# Patient Record
Sex: Female | Born: 1940 | Race: White | Hispanic: No | Smoking: Former smoker
Health system: Southern US, Community
[De-identification: ages and names within clinical notes are randomized; demographics above are authoritative.]

## PROBLEM LIST (undated history)

## (undated) DIAGNOSIS — L570 Actinic keratosis: Secondary | ICD-10-CM

## (undated) DIAGNOSIS — E039 Hypothyroidism, unspecified: Secondary | ICD-10-CM

## (undated) DIAGNOSIS — N040 Nephrotic syndrome with minor glomerular abnormality: Secondary | ICD-10-CM

## (undated) DIAGNOSIS — E785 Hyperlipidemia, unspecified: Secondary | ICD-10-CM

## (undated) DIAGNOSIS — E7800 Pure hypercholesterolemia, unspecified: Secondary | ICD-10-CM

## (undated) DIAGNOSIS — N289 Disorder of kidney and ureter, unspecified: Secondary | ICD-10-CM

## (undated) DIAGNOSIS — C4491 Basal cell carcinoma of skin, unspecified: Secondary | ICD-10-CM

## (undated) DIAGNOSIS — M8580 Other specified disorders of bone density and structure, unspecified site: Secondary | ICD-10-CM

## (undated) DIAGNOSIS — IMO0002 Reserved for concepts with insufficient information to code with codable children: Secondary | ICD-10-CM

## (undated) DIAGNOSIS — E079 Disorder of thyroid, unspecified: Secondary | ICD-10-CM

## (undated) DIAGNOSIS — J45909 Unspecified asthma, uncomplicated: Secondary | ICD-10-CM

## (undated) DIAGNOSIS — R011 Cardiac murmur, unspecified: Secondary | ICD-10-CM

## (undated) DIAGNOSIS — M1990 Unspecified osteoarthritis, unspecified site: Secondary | ICD-10-CM

## (undated) DIAGNOSIS — K219 Gastro-esophageal reflux disease without esophagitis: Secondary | ICD-10-CM

## (undated) DIAGNOSIS — I10 Essential (primary) hypertension: Secondary | ICD-10-CM

## (undated) DIAGNOSIS — IMO0001 Reserved for inherently not codable concepts without codable children: Secondary | ICD-10-CM

## (undated) DIAGNOSIS — J449 Chronic obstructive pulmonary disease, unspecified: Secondary | ICD-10-CM

## (undated) HISTORY — DX: Essential (primary) hypertension: I10

## (undated) HISTORY — DX: Chronic obstructive pulmonary disease, unspecified: J44.9

## (undated) HISTORY — DX: Disorder of kidney and ureter, unspecified: N28.9

## (undated) HISTORY — DX: Nephrotic syndrome with minor glomerular abnormality: N04.0

## (undated) HISTORY — DX: Hyperlipidemia, unspecified: E78.5

## (undated) HISTORY — PX: BILATERAL SALPINGOOPHORECTOMY: SHX1223

## (undated) HISTORY — DX: Actinic keratosis: L57.0

## (undated) HISTORY — DX: Basal cell carcinoma of skin, unspecified: C44.91

## (undated) HISTORY — PX: ABDOMINAL HYSTERECTOMY: SHX81

## (undated) HISTORY — DX: Gastro-esophageal reflux disease without esophagitis: K21.9

## (undated) HISTORY — DX: Pure hypercholesterolemia, unspecified: E78.00

## (undated) HISTORY — DX: Other specified disorders of bone density and structure, unspecified site: M85.80

## (undated) HISTORY — DX: Disorder of thyroid, unspecified: E07.9

## (undated) HISTORY — DX: Reserved for concepts with insufficient information to code with codable children: IMO0002

## (undated) HISTORY — DX: Hypothyroidism, unspecified: E03.9

## (undated) HISTORY — PX: MEDIAL PARTIAL KNEE REPLACEMENT: SHX5965

## (undated) HISTORY — DX: Unspecified asthma, uncomplicated: J45.909

## (undated) HISTORY — PX: MOHS SURGERY: SUR867

## (undated) HISTORY — PX: APPENDECTOMY: SHX54

## (undated) HISTORY — PX: RENAL BIOPSY: SHX156

---

## 1998-09-20 ENCOUNTER — Other Ambulatory Visit: Admission: RE | Admit: 1998-09-20 | Discharge: 1998-09-20 | Payer: Self-pay | Admitting: Family Medicine

## 1998-12-24 ENCOUNTER — Ambulatory Visit (HOSPITAL_COMMUNITY): Admission: RE | Admit: 1998-12-24 | Discharge: 1998-12-24 | Payer: Self-pay | Admitting: *Deleted

## 1999-02-10 HISTORY — PX: COLONOSCOPY: SHX174

## 1999-11-12 ENCOUNTER — Encounter: Admission: RE | Admit: 1999-11-12 | Discharge: 1999-11-12 | Payer: Self-pay | Admitting: Family Medicine

## 1999-11-12 ENCOUNTER — Encounter: Payer: Self-pay | Admitting: Family Medicine

## 2000-02-06 ENCOUNTER — Ambulatory Visit (HOSPITAL_BASED_OUTPATIENT_CLINIC_OR_DEPARTMENT_OTHER): Admission: RE | Admit: 2000-02-06 | Discharge: 2000-02-06 | Payer: Self-pay | Admitting: *Deleted

## 2000-11-18 ENCOUNTER — Encounter: Payer: Self-pay | Admitting: Family Medicine

## 2000-11-18 ENCOUNTER — Encounter: Admission: RE | Admit: 2000-11-18 | Discharge: 2000-11-18 | Payer: Self-pay | Admitting: Family Medicine

## 2001-11-22 ENCOUNTER — Encounter: Payer: Self-pay | Admitting: Family Medicine

## 2001-11-22 ENCOUNTER — Encounter: Admission: RE | Admit: 2001-11-22 | Discharge: 2001-11-22 | Payer: Self-pay | Admitting: Family Medicine

## 2002-11-24 ENCOUNTER — Encounter: Admission: RE | Admit: 2002-11-24 | Discharge: 2002-11-24 | Payer: Self-pay | Admitting: Family Medicine

## 2002-11-24 ENCOUNTER — Encounter: Payer: Self-pay | Admitting: Family Medicine

## 2003-02-06 ENCOUNTER — Ambulatory Visit (HOSPITAL_COMMUNITY): Admission: RE | Admit: 2003-02-06 | Discharge: 2003-02-06 | Payer: Self-pay | Admitting: Orthopedic Surgery

## 2003-06-06 ENCOUNTER — Ambulatory Visit (HOSPITAL_COMMUNITY): Admission: RE | Admit: 2003-06-06 | Discharge: 2003-06-06 | Payer: Self-pay | Admitting: Gastroenterology

## 2003-08-02 ENCOUNTER — Ambulatory Visit (HOSPITAL_BASED_OUTPATIENT_CLINIC_OR_DEPARTMENT_OTHER): Admission: RE | Admit: 2003-08-02 | Discharge: 2003-08-02 | Payer: Self-pay | Admitting: Orthopedic Surgery

## 2003-11-27 ENCOUNTER — Ambulatory Visit (HOSPITAL_COMMUNITY): Admission: RE | Admit: 2003-11-27 | Discharge: 2003-11-27 | Payer: Self-pay | Admitting: Family Medicine

## 2004-12-01 ENCOUNTER — Ambulatory Visit (HOSPITAL_COMMUNITY): Admission: RE | Admit: 2004-12-01 | Discharge: 2004-12-01 | Payer: Self-pay | Admitting: Family Medicine

## 2004-12-09 ENCOUNTER — Inpatient Hospital Stay (HOSPITAL_COMMUNITY): Admission: RE | Admit: 2004-12-09 | Discharge: 2004-12-11 | Payer: Self-pay | Admitting: Orthopedic Surgery

## 2005-03-03 ENCOUNTER — Inpatient Hospital Stay (HOSPITAL_COMMUNITY): Admission: RE | Admit: 2005-03-03 | Discharge: 2005-03-05 | Payer: Self-pay | Admitting: Orthopedic Surgery

## 2005-12-02 ENCOUNTER — Encounter: Admission: RE | Admit: 2005-12-02 | Discharge: 2005-12-02 | Payer: Self-pay | Admitting: Family Medicine

## 2005-12-29 ENCOUNTER — Encounter: Admission: RE | Admit: 2005-12-29 | Discharge: 2005-12-29 | Payer: Self-pay | Admitting: Family Medicine

## 2006-02-09 HISTORY — PX: ROTATOR CUFF REPAIR: SHX139

## 2006-03-17 ENCOUNTER — Encounter: Admission: RE | Admit: 2006-03-17 | Discharge: 2006-03-17 | Payer: Self-pay | Admitting: Family Medicine

## 2006-03-19 ENCOUNTER — Encounter: Admission: RE | Admit: 2006-03-19 | Discharge: 2006-03-19 | Payer: Self-pay | Admitting: Family Medicine

## 2006-06-02 ENCOUNTER — Encounter: Admission: RE | Admit: 2006-06-02 | Discharge: 2006-06-02 | Payer: Self-pay | Admitting: Family Medicine

## 2007-03-17 ENCOUNTER — Encounter: Payer: Self-pay | Admitting: Pulmonary Disease

## 2007-03-17 ENCOUNTER — Encounter: Admission: RE | Admit: 2007-03-17 | Discharge: 2007-03-17 | Payer: Self-pay | Admitting: Family Medicine

## 2007-04-01 ENCOUNTER — Encounter: Payer: Self-pay | Admitting: Pulmonary Disease

## 2007-04-20 ENCOUNTER — Ambulatory Visit: Payer: Self-pay | Admitting: Pulmonary Disease

## 2007-04-20 DIAGNOSIS — R0602 Shortness of breath: Secondary | ICD-10-CM | POA: Insufficient documentation

## 2007-04-20 DIAGNOSIS — J309 Allergic rhinitis, unspecified: Secondary | ICD-10-CM | POA: Insufficient documentation

## 2007-04-20 DIAGNOSIS — J45909 Unspecified asthma, uncomplicated: Secondary | ICD-10-CM | POA: Insufficient documentation

## 2007-04-26 ENCOUNTER — Ambulatory Visit (HOSPITAL_COMMUNITY): Admission: RE | Admit: 2007-04-26 | Discharge: 2007-04-26 | Payer: Self-pay | Admitting: Family Medicine

## 2007-04-28 ENCOUNTER — Ambulatory Visit: Payer: Self-pay

## 2007-04-28 ENCOUNTER — Encounter (INDEPENDENT_AMBULATORY_CARE_PROVIDER_SITE_OTHER): Payer: Self-pay | Admitting: Internal Medicine

## 2007-05-16 ENCOUNTER — Telehealth (INDEPENDENT_AMBULATORY_CARE_PROVIDER_SITE_OTHER): Payer: Self-pay | Admitting: *Deleted

## 2007-10-14 ENCOUNTER — Encounter: Admission: RE | Admit: 2007-10-14 | Discharge: 2007-10-14 | Payer: Self-pay | Admitting: Family Medicine

## 2008-04-26 ENCOUNTER — Ambulatory Visit (HOSPITAL_COMMUNITY): Admission: RE | Admit: 2008-04-26 | Discharge: 2008-04-26 | Payer: Self-pay | Admitting: Family Medicine

## 2009-04-30 ENCOUNTER — Ambulatory Visit (HOSPITAL_COMMUNITY): Admission: RE | Admit: 2009-04-30 | Discharge: 2009-04-30 | Payer: Self-pay | Admitting: Family Medicine

## 2010-02-09 HISTORY — PX: REPLACEMENT TOTAL KNEE: SUR1224

## 2010-03-02 ENCOUNTER — Encounter: Payer: Self-pay | Admitting: Family Medicine

## 2010-03-11 NOTE — Assessment & Plan Note (Signed)
Summary: EVAL OF ASTHMA/APC   Visit Type:  Initial Consult PCP:  mazzocchi  Chief Complaint:  pulmonary consult....SOB....reviewed meds.  History of Present Illness: REferred for dyspnea on exertion x several yrs - gradually progreesive - on adavair & pro-air x 1 yr with limited benefit. Worse on walking uphill, no wheezing , no recurrent chest colds or cough No h/s/o orthopnea or PND. CXR -ve Participates in weight watchers & water aerobics - lost 6 lbs/ 6 weeks     Past Medical History:    Allergic Rhinitis    Asthma  Past Surgical History:    2 partial knee replacements    hysterectomy    rotator cuff on L shoulder   Family History:    Father died of cancer of throat    mother had emphysema    mother had allergies    Aunt had asthma  Social History:    lives alone    retired    2 children 1 boy and 1 girl    was a Runner, broadcasting/film/video   Risk Factors:  Tobacco use:  quit    Year quit:  25 years ago    Pack-years:  2 pks a day smoked for 20 yrs Alcohol use:  yes    Type:  wine    Drinks per day:  2    Vital Signs:  Patient Profile:   70 Years Old Female Height:     66 inches Weight:      210 pounds BMI:     34.02 O2 Sat:      94 % O2 treatment:    Room Air Temp:     97.9 degrees F oral Pulse rate:   98 / minute BP sitting:   130 / 80  (left arm) Cuff size:   regular  Pt. in pain?   no  Vitals Entered By: Clarise Cruz Duncan Dull) (April 20, 2007 2:02 PM)                  Physical Exam  General:     HEENT - no thrush, no post nasal drip No JVD, no lymphadenopathy CVS- s1s2 nml, no murmur RS- clear, no crackles or rhonchi Abd- soft, non-tender, no organomegaly CNS- non focal Ext - no edema      Impression & Recommendations:  Problem # 1:  DYSPNEA (ICD-786.05) cxr-ve, spirometry reviewed - mild obstruction- not impressed Will obtain echo,  Reassess in 3-4 mnths Orders: Consultation Level III (44034) Echo Referral (Echo)   Medications  Added to Medication List This Visit: 1)  Nexium 40 Mg Cpdr (Esomeprazole magnesium) .... Once daily 2)  Lipitor 20 Mg Tabs (Atorvastatin calcium) .... Once daily 3)  Adult Aspirin Ec Low Strength 81 Mg Tbec (Aspirin) .... Once daily 4)  Climara 0.05 Mg/24hr Ptwk (Estradiol) .... Once daily 5)  Claritin-d 24 Hour 10-240 Mg Tb24 (Loratadine-pseudoephedrine) .... Once daily 6)  Hydrochlorothiazide 25 Mg Tabs (Hydrochlorothiazide) .... Once daily 7)  Advair Diskus 250-50 Mcg/dose Misc (Fluticasone-salmeterol) .Marland Kitchen.. 1 puff twice a day 8)  Celebrex 200 Mg Caps (Celecoxib) .Marland Kitchen.. 1 cap two times a day 9)  Proair Hfa 108 (90 Base) Mcg/act Aers (Albuterol sulfate) .... 2 puffs two times a day 10)  Oscal 500/200 D-3 500-200 Mg-unit Tabs (Calcium-vitamin d) .... Once daily 11)  Opc-3  .... As directed 12)  Vitamin D3-50,000 Units (vitamin D4)  .... As directed   Patient Instructions: 1)  Please schedule a follow-up appointment in 4 months. 2)  You  have been asked to make an appointment for a Pulmonary Function Test (Breathing Test) prior to or at the time of your next visit. Use medications as usual unless otherwise instructed. 3)  A 2D Echocardiogram has been recommended.  Your imaging study may require preauthorization. 4)  call if symptoms worsen    ]

## 2010-03-11 NOTE — Progress Notes (Signed)
Summary: need resuslts  Phone Note Call from Patient   Caller: Patient Call For: alva Summary of Call: need results of echocardiogram Patient's chart has been requested.  Initial call taken by: Rickard Patience,  May 16, 2007 8:55 AM  Follow-up for Phone Call        ECHO Report printed for your review and given to [facility]. ..................................................................Marland KitchenMichel Bickers CMA  May 16, 2007 9:25 AM  I called ms to advised echo normal per dr. Vassie Loll .Marland Kitchen...no answer and left message. Follow-up by: Clarise Cruz Duncan Dull),  May 16, 2007 3:09 PM  Additional Follow-up for Phone Call Additional follow up Details #1::        Pt called back.  Advised ECHO results normal per above note. Additional Follow-up by: Cloyde Reams RN,  May 16, 2007 2:26 PM

## 2010-04-01 ENCOUNTER — Other Ambulatory Visit: Payer: Self-pay

## 2010-04-01 DIAGNOSIS — Z1231 Encounter for screening mammogram for malignant neoplasm of breast: Secondary | ICD-10-CM

## 2010-05-02 ENCOUNTER — Ambulatory Visit (HOSPITAL_COMMUNITY): Payer: Medicare Other

## 2010-05-07 ENCOUNTER — Ambulatory Visit (HOSPITAL_COMMUNITY)
Admission: RE | Admit: 2010-05-07 | Discharge: 2010-05-07 | Disposition: A | Discharge status: Home / Self Care | Payer: Medicare Other | Source: Ambulatory Visit | Attending: Family Medicine | Admitting: Family Medicine

## 2010-05-07 DIAGNOSIS — Z1231 Encounter for screening mammogram for malignant neoplasm of breast: Secondary | ICD-10-CM | POA: Insufficient documentation

## 2010-05-09 ENCOUNTER — Other Ambulatory Visit: Payer: Self-pay | Admitting: Family Medicine

## 2010-05-09 DIAGNOSIS — R928 Other abnormal and inconclusive findings on diagnostic imaging of breast: Secondary | ICD-10-CM

## 2010-05-19 ENCOUNTER — Ambulatory Visit
Admission: RE | Admit: 2010-05-19 | Discharge: 2010-05-19 | Disposition: A | Discharge status: Home / Self Care | Payer: Medicare Other | Source: Ambulatory Visit | Attending: Family Medicine | Admitting: Family Medicine

## 2010-05-19 DIAGNOSIS — R928 Other abnormal and inconclusive findings on diagnostic imaging of breast: Secondary | ICD-10-CM

## 2010-06-27 NOTE — H&P (Signed)
NAMELILLE, [name]             ACCOUNT NO.:  [id number]   MEDICAL RECORD NO.:  [id number]           PATIENT TYPE:   LOCATION:                                 FACILITY:   PHYSICIAN:  Madlyn Frankel. Charlann Boxer, M.D.       DATE OF BIRTH:   DATE OF ADMISSION:  03/08/2005  DATE OF DISCHARGE:                                HISTORY & PHYSICAL   PRINCIPAL DIAGNOSIS:  Right knee medial compartment osteoarthritis.   SECONDARY DIAGNOSES:  1.  History of heart murmur.  2.  History of urinary incontinence.  3.  Arthritis.   HISTORY OF PRESENT ILLNESS:  [name] is a pleasant 70 year old female  who I have been treating orthopedically. Most recently, she was status post  a left unicompartmental knee replacement for medial compartment  osteoarthritis which she has done very well. She has had progressive pain in  the right knee with failure of conservative measures. She wished to proceed  with the same procedure as on the left. She was in the office today for a  history and physical with no reports of any fevers, chills, night sweats.  She is ready to proceed with the right surgery.   PAST MEDICAL HISTORY:  1.  Urinary incontinence.  2.  History of heart murmur.  3.  Arthritis.   CURRENT MEDICATIONS:  1.  Celebrex which has been stopped preoperatively.  2.  Lipitor.  3.  Estradiol.  4.  Aspirin.  5.  Claritin.  6.  Advair.   ALLERGIES:  No known drug allergies.   FAMILY HISTORY:  Noncontributory.   REVIEW OF SYSTEMS:  Unremarkable other than noted in the HPI. No upper  respiratory, pulmonary, cardiac, gastrointestinal, genitourinary issues  other than that already mentioned.   PHYSICAL EXAMINATION:  VITAL SIGNS:  She had a temperature of 97.8, heart  rate of 64, blood pressure 118/82.  GENERAL:  She is awake, alert, and oriented.  HEENT:  No asymmetry. No slurred speech.  CHEST:  Clear to auscultation bilaterally.  HEART:  Regular rate and rhythm. There is a murmur detected.  ABDOMEN:  Soft and nontender with positive bowel sounds.  EXTREMITIES:  Well-healed incision on the left knee with full knee extension  and full flexion. Stable ligaments. Extremity of the right knee reveals  tenderness along the medial side of the knee but full extension, flexion and  no deformity. Slight effusion.   Radiographs including stress radiographs of the right knee had been  performed indicating an anterior medial wear pattern with an intact medial  and anterior cruciate ligaments.   IMPRESSION:  Right knee medial compartment osteoarthritis, status post left  unicompartmental knee replacement.   PLAN:  The patient will be admitted for same day surgery on March 08, 2005  for right unicompartmental knee replacement. Risks and benefits were  discussed and reviewed from her previous surgery. Consent will be obtained  based off the office visit today. We expect her in the hospital for a one to  two day stay with plans to transition to outpatient physical therapy as she  had previously.  Madlyn Frankel Charlann Boxer, M.D.  Electronically Signed     MDO/MEDQ  D:  03/07/2005  T:  03/07/2005  Job:  [id number]

## 2010-06-27 NOTE — Discharge Summary (Signed)
NAMESYRETTA, KOCHEL             ACCOUNT NO.:  [id number]   MEDICAL RECORD NO.:  [id number]          PATIENT TYPE:  INP   LOCATION:  1512                         FACILITY:  [facility]   PHYSICIAN:  Madlyn Frankel. Charlann Boxer, M.D.  DATE OF BIRTH:  06-04-1940   DATE OF ADMISSION:  12/09/2004  DATE OF DISCHARGE:  12/11/2004                                 DISCHARGE SUMMARY   ADMISSION DIAGNOSES:  1.  Left knee medial compartment osteoarthritis, left greater than right.  2.  Dyslipidemia.   DISCHARGE DIAGNOSES:  1.  Left knee medial compartment osteoarthritis, left greater than right.  2.  Dyslipidemia.  3.  Mild postoperative anemia.   OPERATION:  On December 09, 2004, the patient underwent Oxford  unicompartmental knee.   BRIEF HISTORY:  This 70 year old lady with progressive problems concerning  her left knee.  She has arthritis in both knees, but it is more significant  on the left.  X-rays showed osteoarthritis of the medial compartment.  This  is a very active lady, and she highly desires to have the surgical  intervention so that she may continue with her daily activities.  After the  risks and benefits of surgery were described to the patient by Dr. Charlann Boxer, the  above procedure was performed.   HOSPITAL COURSE:  The patient tolerated the surgical procedure quite well.  She was placed on Coumadin protocol postoperatively for the prevention of  DVT.  Will continue on that four days after surgery.  She was given ADLs on  activities that would allow her to negotiate safely in her home.  Genevieve Norlander  was arranged for home health as well.  She did have some itching  postoperatively, which was felt due to the PCA.  That was discontinued.  She  had no more further problems.  She was discharged with analgesics as well as  muscle relaxants.   Laboratory values in the hospital hematologically showed a CBC  preoperatively completely within normal limits.  Final hemoglobin was 11.4,  hematocrit 33.6.   Blood chemistries x2 were negative.  Urinalysis negative  for urinary tract infection.   Chest x-ray showed cardiomegaly.  No edema.  No active cardiopulmonary  abnormalities.   No EKGs on this chart.   CONDITION ON DISCHARGE:  Improved, stable.   PLAN:  The patient is discharged to her home.  They are to see Dr. Charlann Boxer two  weeks after the date of surgery.  Continue weightbearing as tolerated.  She  may flex the knee anyway she desires.  Mild analgesics and muscle relaxants  were given to her at discharge.      Dooley L. Cherlynn June.      Madlyn Frankel Charlann Boxer, M.D.  Electronically Signed    DLU/MEDQ  D:  12/23/2004  T:  12/23/2004  Job:  [id number]

## 2010-06-27 NOTE — Op Note (Signed)
[place]. [facility]  Patient:    [name], [name]                    MRN: [id number] Proc. Date: 02/06/00 Adm. Date:  [id number] Attending:  Melody Comas.                           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  OPERATION PERFORMED:  Excisional biopsy of 2.0 cm area of Bowens disease upper forehead.  SURGEON:  Janet Berlin. Dan Humphreys, M.D.  ASSISTANT:  None.  ANESTHESIA:  Local.  INDICATIONS FOR PROCEDURE:  The patient is a 70 year old woman who has biopsy proven Bowens disease of the upper forehead.  This is oriented transversely in the midforehead just below the hairline.  She is taken to the operating room at this time for excisional biopsy.  DESCRIPTION OF PROCEDURE:  The patient was brought back to the minor surgery operating room in the [facility].  She was placed in the table in the supine position.  I sterilely prepped and draped the upper forehead area.  An elliptically shaped excision was designed to include grossly clear tissue margins using a sterile surgical marking pen.  The area was then infiltrated with 1% Xylocaine containing epinephrine.  The lesion was then excised full thickness.  The patient experienced quite a bit of bleeding from the wound which required several minutes of pressure and suturing to control.  I was able to reapproximate the wound edges with interrupted, buried sutures of 4-0 Vicryl.  This was followed with placement of vertical mattress sutures of 5-0 nylon.  A running 5-0 nylon suture was then used for final epidermal leveling.  The wound was sterilely dressed with bacitracin ointment, Xeroform ointment, 2 x 2 surgical gauze, and surgical tape.  The patient was given wound care instructions and will see me next week for suture removal and review of her final  pathology report. DD:  02/06/00 TD:  02/06/00 Job: [id number]

## 2010-06-27 NOTE — Op Note (Signed)
NAMEFLEUR, AUDINO             ACCOUNT NO.:  [id number]   MEDICAL RECORD NO.:  [id number]          PATIENT TYPE:  INP   LOCATION:  1517                         FACILITY:  [facility]   PHYSICIAN:  Madlyn Frankel. Charlann Boxer, M.D.  DATE OF BIRTH:  08/29/1940   DATE OF PROCEDURE:  12/09/2004  DATE OF DISCHARGE:                                 OPERATIVE REPORT   PREOPERATIVE DIAGNOSIS:  Left knee medial compartment osteoarthritis.   POSTOPERATIVE DIAGNOSIS:  Left knee medial compartment osteoarthritis.   PROCEDURE:  Left knee unicompartmental knee replacement, utilizing a Biomed  Oxford knee system with a size medication femur, 41 x 26 tibia and a size 4  polyethylene liner.   SURGEON:  Madlyn Frankel. Charlann Boxer, M.D.   ASSISTANTDruscilla Brownie. Cherlynn June.   ANESTHESIA:  General.   BLOOD LOSS:  150 cc.   TOURNIQUET TIME:  Ninety minutes at 250 mmHg.   DRAINS:  One.   COMPLICATIONS:  None.   INDICATIONS FOR PROCEDURE:  Ms. Noguchi is a 70 year old female who I had  been following for some time for bilateral knee pain, left greater than  right.  She has had knee arthroscopies for meniscal debridement,  chondroplasty.  The vast majority of her pain has been medially.  Radiographs reveal an anteromedial wear pattern.  I discussed surgical  options of total knee replacement versus unicompartmental, and she was very  much interested in pursuing the unicompartmental.  Risks and benefits were  reviewed, including failure due to progressive discomfort in the anterior  compartment or lateral compartment.  Consent was obtained.   PROCEDURE IN DETAIL:  The patient was brought to the operating theater.  Once adequate anesthesia and preoperative antibiotics were administered, 1  gm of Ancef, the patient was positioned supine.  A proximal thigh tourniquet  was placed.  The left lower extremity was positioned over the Oxford knee  holder.  The left lower extremity was then prepped and draped in a sterile  fashion.  A midline incision was made from the patella to the tubercle.  Sharp dissection was carried down.  We made a modified medial parapatellar  arthrotomy.  Following knee exposure, then partial medial meniscectomy,  attention was directed to the tibia.  A tibial cut was made perpendicular to  the tibial shaft along the tibial spine to the medial third of the tubercle.  The reciprocating saw was utilized to cut along the medial aspect of the  tibial spine into the notch towards the hip joint.  Following this, I sized  the tibia, and a size 4 spacer fit well into the space.  At this point, the  tibial extramedullary guide was removed, and attention was directed to the  femur.  With the tibial tray in place, a size 3 feeler gauge and the femoral  guide.  The guide was positioned with the medial portion of the femur and  parallel to the intramedullary rod in both the AP and lateral planes.   Once this was confirmed, drill holes were made.  Posterior femoral cutting  jig was positioned.  The oscillating saw was utilized to  make a cut.  At  this point, a 0 spigot was positioned, and the femur milled.  Based on the  math per protocol, with a 4 feeler gauge in place and flexion of the knee, I  had appropriate tension and flexion; however, on extension, I was unable to  even really get the #1 feeler gauge into position.  Given this, I put the 4  spigot into place.  It was noted at this time that the spigot was slightly  off the bone.  This would be important, as I would increase the spigot size.  Following placement of the 4, I still was unable to pass the two feeler  gauge, and extension and flexion of the 4 still felt good.  Because of this,  I put the six spigot in place.  At this point, I was able to mill the distal  femur, creating a ridge, as you would expect.  At this point with the trial  components in place, the size 4 feeler gauze fit nicely in flexion as well  as in extension.   Given these parameters, final debridement of the femur was  carried out, including the notch anteriorly and the osteophytes off the  medial and lateral aspects of the medial femoral condyle.  I did also knock  off posterior osteophytes, utilizing this special osteotome.  At this point,  the final preparation of the tibia was carried out with the 41 x 26 tibial  guide in place.  I used the reciprocating saw to create the trough and then  knocked out this bone.  The posterior and anterior cortex remained intact.  The trial components were then positioned with a keel component.  Then the 4  polyethylene trial was placed.  There was no lift-off in deep flexion.  The  knee came to full extension with stable ligaments.  There was about 1-2 mm  toggle, as would be expected in flexion.  At this point, the final  components were brought into the field.  The donor was prepared for final  cement preparation.  Cement was mixed in two batches.  Tibial components  were cemented in first, followed by femoral component.  At each time, time  was taken to make sure there was no excessive cement to be removed.  Once  cement had cured from both batches, the final polyethylene liner was  positioned.  This was a size 4.  It had a good snap-in feel.   Please note too, that soft tissue debridement, including the remaining  posterior horn and medial meniscectomy was carried out, prior to proceeding  with various steps of the procedure.  The medial collateral ligament  remained intact the whole time.  The ACL was visualized.  There was some  grade 2 chondromalacia, given the patellofemoral trochlear region.   Ms. Foots will be in the hospital for 1-2 days for pain control.  Get  her up with physical therapy and ambulating.      Madlyn Frankel Charlann Boxer, M.D.  Electronically Signed     MDO/MEDQ  D:  12/09/2004  T:  12/09/2004  Job:  [id number]

## 2010-06-27 NOTE — H&P (Signed)
[name], [name]             ACCOUNT NO.:  [id number]   MEDICAL RECORD NO.:  [id number]          PATIENT TYPE:  INP   LOCATION:  1512                         FACILITY:  [facility]   PHYSICIAN:  Madlyn Frankel. Charlann Boxer, M.D.  DATE OF BIRTH:  Jun 09, 1940   DATE OF ADMISSION:  12/09/2004  DATE OF DISCHARGE:  12/11/2004                                HISTORY & PHYSICAL   ADMISSION DIAGNOSIS:  Bilateral knee osteoarthritis, left worse than right,  primarily medial compartment.   HISTORY OF PRESENT ILLNESS:  Ms. Kirlin is a very pleasant 70 year old  female who I have been following for some time regarding bilateral knee  osteoarthritis.  She has failed conservative measures including knee  arthroscopy and medications, and injections.  When discussed treatment  options, based on her age, activity level, and radiographic appearance, most  of her wear pattern appeared to be in the anterior medial aspect of the  knee, and we discussed the options of unicompartmental versus total knee  replacement and she wishes to proceed with unicompartmental.   PAST MEDICAL HISTORY:  Hiatal hernia, osteoarthritis.   PAST SURGICAL HISTORY:  Hysterectomy, rotator cuff repair, left knee  arthroscopy, and right knee arthroscopy.   FAMILY HISTORY:  Lymphoma, arthritis.   CURRENT MEDICATIONS:  1.  Celebrex.  2.  Estradiol.  3.  Lipitor.   ALLERGIES:  No known drug allergies.   REVIEW OF SYSTEMS:  Otherwise unremarkable for an upper respiratory,  pulmonary, cardiac, gastrointestinal, or genitourinary issues.   PHYSICAL EXAMINATION:  VITAL SIGNS:  Temperature 98.7, pulse 76, blood  pressure 132/80.  GENERAL:  She is awake, alert, oriented, and cooperative.  Appropriately  dressed as she always has been.  No evidence of any facial asymmetry.  NECK:  Supple with no nodes palpable.  She has no bruits detected on  auscultation.  CHEST:  Clear to auscultation bilaterally.  HEART:  Regular rate and rhythm, no  murmurs detected.  ABDOMEN:  Soft and nontender with positive bowel sounds.  EXTREMITIES:  Left knee with slight varus tendency with pain over the  anterior medial aspect of the knee, none over the patella, minimal facet  tenderness, no lateral-sided tenderness.  Ligaments otherwise intact.  Overlying skin is intact with no cellulitis.   Radiographs indicate the left knee osteoarthritis in the medial compartment.  Stress radiographs reveal an intact MCL, preserved lateral compartment.  Lateral view indicates a functional anterior cruciate ligament.   IMPRESSION:  Left knee medial compartment osteoarthritis left greater than  right.   PLAN:  The patient is to go to the OR on the same day of admission, December 09, 2004, for left unicompartmental knee replacement.  Plan will be for a 1-  2 day hospital stay with plan on discharge with therapy.  Risks and benefits  were reviewed in the office.  Date of office visit was December 05, 2004.      Madlyn Frankel Charlann Boxer, M.D.  Electronically Signed     MDO/MEDQ  D:  12/17/2004  T:  12/18/2004  Job:  [id number]

## 2010-06-27 NOTE — Op Note (Signed)
NAME:  [name], COLLET                       ACCOUNT NO.:  [id number]   MEDICAL RECORD NO.:  [id number]                   PATIENT TYPE:  AMB   LOCATION:  DAY                                  FACILITY:  [facility]   PHYSICIAN:  Madlyn Frankel. Charlann Boxer, M.D.               DATE OF BIRTH:  26-Mar-1940   DATE OF PROCEDURE:  02/06/2003  DATE OF DISCHARGE:                                 OPERATIVE REPORT   PREOPERATIVE DIAGNOSES:  1. Left shoulder type 2 flap tear.  2. Left shoulder subacromial impingement.  3. Left shoulder rotator cuff tear involving the distal aspect of the     supraspinatus tendon.   POSTOPERATIVE DIAGNOSES/FINDINGS:  1. Left shoulder type 2 flap tear.  2. Left shoulder subacromial impingement.  3. Left shoulder rotator cuff tear involving the distal aspect of the     supraspinatus tendon.   PROCEDURES:  1. Diagnostic and operative arthroscopy with labrale debridement at flap     tear.  2. Open subacromial decompression.  3. Mini open rotator cuff repair.   SURGEON:  Madlyn Frankel. Charlann Boxer, M.D.   ASSISTANT:  Clarene Reamer, P.A.-C.   ANESTHESIA:  General.   BLOOD LOSS:  Minimal.   DRAINS:  None.   COMPLICATIONS:  None apparent.   INDICATIONS FOR PROCEDURE:  Ms. 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  She had failed physical therapy and injections.  Her last MRI  was one year prior to evaluation secondary to persistent difficulty.  A  repeat MRI was performed to rule out rotator cuff tear.  At that point,  there was identifiable cuff tear at the distal aspect of the supraspinatus.  After reviewing the risks and benefits of arthroscopic evaluation and open  rotator cuff repair, she consents for the procedure.   PROCEDURE IN DETAIL:  The patient was brought to the operating theater.  Once adequate anesthesia and preoperative antibiotics were administered, the  patient was positioned in  the beach chair position.  The left upper  extremity was then initially prepped and draped for arthroscopy.  Arthroscopic evaluation of that shoulder was carried out through a posterior  portal reveal a type 2 SLAP tear with significant labral deterioration.  The  remainder of the exam of the labrum was intact.  The glenoid surface was  noted to have a small 5 x 5 mm type 2 chondromalacia.  The humeral head was  otherwise intact.  The rotator cuff tear was identified in the distal aspect  of the supraspinatus.  At this point, the anterior portal was created.  The  biceps tendon was evaluated and noted to be intact.  The labral tissue was  then debrided back to a stable level.  Following this, the arthroscopic  instrumentation was removed from the shoulder, portals reapproximated using  4-0 nylon, and attention was directed  to the open repair.  A saber incision  was made over the lateral aspect of the acromion in line with the skin  crease.  A subcutaneous skin dissection was carried out overlying the  deltoid fascia to allow for exposure.  The deltoid fascia was then opened  with a Bovie in line with the raphe.  The deltoid fascia was periosteal  stripped off of the anterolateral acromion.  Note that the intra-articular  deltoid fascia was split using a knife for later repair and bursectomy  carried out.  Following this, an oscillating saw with PPS system was used to  perform an open subacromial decompression.  The post subacromial  decompression left a nice capacious space with a cuff, as well as a smooth  surface.  At this point, attention was directed to the rotator cuff repair.  Following debridement of the bursa, the cuff surface was identified.  Retention sutures were placed to allow for traction on the cuff.  Following  this, an incision was made to place a Mitek anchor medial, which was  performed per protocol followed by placement of two suture strands through  the medial aspect of  the cuff.  Next bone tunnels were prepared lateral for  lateral repair through bone tunnels.  Following the passage of the articular  surface sutures through the bone tunnels, the cuff was placed in an anatomic  position and the sutures through the Mitek anchor reapproximated.  Following  this the lateral sutures were placed through the bone tunnels and were  cinched down to complete the lateral repair.  Following this the shoulder  was copiously irrigated with normal saline solution.  The patient was noted  to have a nice watertight repair.  At this point, the articular surface of  the deltoid fascia was reapproximated using 2-0 Monopril.  Then 1-0 Vicryl  was used to reapproximate the remaining deltoid fascia, including that over  the lateral acromial edge.  The subcutaneous layer was reapproximated using  2-0 Vicryl and 4-0 Monocryl was used on the skin edge.  The patient  tolerated the procedure without complication and was extubated and  transferred to the recovery room in stable condition.                                               Madlyn Frankel Charlann Boxer, M.D.    MDO/MEDQ  D:  02/06/2003  T:  02/06/2003  Job:  [id number]

## 2010-06-27 NOTE — Op Note (Signed)
[name], [name]             ACCOUNT NO.:  [id number]   MEDICAL RECORD NO.:  [id number]          PATIENT TYPE:  INP   LOCATION:  1505                         FACILITY:  [facility]   PHYSICIAN:  [name]. Charlann Boxer, M.D.  DATE OF BIRTH:  [birth year]   DATE OF PROCEDURE:  03/03/2005  DATE OF DISCHARGE:                                 OPERATIVE REPORT   PREOPERATIVE DIAGNOSIS:  Right knee medial compartment osteoarthritis.   POSTOPERATIVE DIAGNOSIS:  Right knee medial compartment osteoarthritis.   PROCEDURE:  Right knee medial compartmental knee replacement.   COMPONENTS USED:  Biomet Oxford knee with a size medium femur, a size B or  41 x 26 medial tibial tray with a size 4 mm thick medium bearing.   SURGEON:  [name]. Charlann Boxer, M.D.   ASSISTANTDruscilla Brownie. Idolina Primer, PA-C.   ANESTHESIA:  General.   DRAINS:  One.   COMPLICATIONS:  None apparent.   BLOOD LOSS:  200 cc.   INDICATIONS FOR PROCEDURE:  [name] is a 70 year old female who I have  treated for bilateral medial compartment knee osteoarthritis.  She is  approximately eight weeks out from left knee unicompartmental knee  replacement, which she has done very well with.  She has been eager to  pursue this right knee surgery.  We reviewed the risks and benefits in the  office.  Consent was obtained.  She did have stress radiographs indicating  intact medial collateral ligament.  Lateral radiographs indicated an intact  functional anterior cruciate ligament.  Consent was obtained following  review of the risks and benefits.   PROCEDURE IN DETAIL:  Patient was brought to the operating theater.  Once  adequate anesthesia and preoperative antibiotics, 1 gm of Ancef, were  administered, the patient was positioned supine, utilizing the Oxford knee  holder.  The proximal thigh tourniquet was utilized.  The right lower  extremity was then prepped and draped in a sterile fashion.  A para midline  incision was made from the  proximal fold of the patella to the tibial  tubercle.  Sharp dissection was carried out to expose the extensor  mechanism.  A modified medial parapatellar arthrotomy was carried out for  exposure of the knee.  Osteophytes were debrided off the medial aspect of  the femur all the way up anteriorly.  Notch osteophytes were debrided as  well.   Once exposure was obtained, the tibial external medullary guide was  positioned.  Resection of the tibia was carried out with first the  reciprocating saw followed by an oscillating saw.  With the initial pin in  position, I attempted to trial the 41 x 26 tray with a 4 feeler gauge.  I  was unable to pass this.  For this reason, I went ahead and resected 3 mm  more by changing the pin hole site.  Once this was done, the 4 feeler gauge  fit with adequate tension.  Based on this, the tension was now directed to  the femur.  A starting awl was positioned 1 cm above the medial aspect of  the notch and then  the guide rod positioned.  With the tibial tray and the  size 3 feel gauge in place, the femoral drill guide was positioned,  according to all parameters.  With it appropriately positioned, the drill  holes were made.  I then placed the posterior femoral cutting guide in  place, and the drill hole was made.  The cut was made.  At this point, I  placed a 0 spigot and milled the femur.  After adjusting some of the cuts  based on the posterior cut, I then placed the femoral component with  initially the 4 feeler gauge.  I was then able to pass in extension without  retraction in place the one feeler gauge.  For this reason, I used a 3  spigot.  This 3 spigot was then placed in the femur and milled.  Bone edges  were debrided off of the posterior medial and lateral aspects of  the cut as  well as in the central hole.  The trial was then carried out with a medium  femur and the 41 x 26 tibia, and the 4 feeler gauge, which fit great in  flexion and extension.   The knee came out to full extension and was stable.   Once this was determined, the trial femur was removed.  I pinned the tibial  component into position, and then utilizing the reciprocal saw, cut out the  keeled area.  The keeled trial was in position, and a trial reduction  carried out with a size 4 insert.  I was very happy with the stability and  with the knee coming into full flexion and extension, stable ligaments with  approximately 2 mm of movement with exam.  With this, the final components  are brought into the field with the polyethylene held.   Once the final components are brought to the field, I did cut out the  anterior notch on the femur, where the prosthesis would fit.  I also drilled  into the posterior aspect of the femur, where there was sclerotic bone.  The  knee was copiously irrigated with normal saline solution.  Once the  components were brought, the knee prepared.  The components were cemented  into position with a two-batch technique, first the tibia, then the femur,  each time using the trial components and holding the knee in 45 degrees of  flexion to allow for compression.  Once this cement had cured, excessive  cement was removed, first off the tibial side, then the femoral side.  Once  the cement had dried and excessive cement had cured, retrialed the size 4  component to make sure that it was happy with its fit, and it was.  A final  4 medial polyethylene liner was then placed by a thumb without significant  application of pressure.  The poly was noted to track along the lateral  portion of the tibial tray.  There was no evidence of impingement anteriorly  with flexion or extension.  Final irrigation was carried out.  A medium  Hemovac drain was placed deep.  The extensor mechanism was then  reapproximated with a #1 PDS followed by a 2-0 Vicryl and 4-0 Monocryl.  The wound was cleaned, dried, and dressed sterilely with Steri-Strips,  dressings, sponges,  tape and a bulky Jones dressing.  The patient was then  transferred to the recovery room in stable condition.      [name] Charlann Boxer, M.D.  Electronically Signed     MDO/MEDQ  D:  03/03/2005  T:  03/03/2005  Job:  [id number]

## 2010-06-27 NOTE — Op Note (Signed)
NAME:  [name], [name]                       ACCOUNT NO.:  [id number]   MEDICAL RECORD NO.:  [id number]                   PATIENT TYPE:  AMB   LOCATION:  ENDO                                 FACILITY:  [facility]   PHYSICIAN:  Anselmo Rod, M.D.               DATE OF BIRTH:  1940/12/20   DATE OF PROCEDURE:  06/06/2003  DATE OF DISCHARGE:                                 OPERATIVE REPORT   PROCEDURE PERFORMED:  Screening colonoscopy.   ENDOSCOPIST:  Charna Elizabeth, M.D.   INSTRUMENT USED:  Olympus video colonoscope.   INDICATIONS FOR PROCEDURE:  The patient is a 70 year old white female  undergoing screening colonoscopy to rule out colonic polyps, masses,  hemorrhoids, etc.   PREPROCEDURE PREPARATION:  Informed consent was procured from the patient.  The patient was fasted for eight hours prior to the procedure and prepped  with a bottle of magnesium citrate and a gallon of GoLYTELY the night prior  to the procedure.   PREPROCEDURE PHYSICAL:  The patient had stable vital signs.  Neck supple.  Chest clear to auscultation.  S1 and S2 regular.  Abdomen soft with normal  bowel sounds.   DESCRIPTION OF PROCEDURE:  The patient was placed in left lateral decubitus  position and sedated with 100 mg of Demerol and 10 mg of Versed in slow  incremental doses.  Once the patient was adequately sedated and maintained  on low flow oxygen and continuous cardiac monitoring, the Olympus video  colonoscope was advanced from the rectum to the cecum.  There was diffuse  melanosis coli seen throughout the colonic mucosa.  No masses, polyps,  erosions, ulcerations or diverticula were identified.  Retroflexion in the  rectum revealed no abnormalities.  The patient tolerated the procedure well  without immediate complications.   IMPRESSION:  Unrevealing colonoscopy except for melanosis coli throughout  the colonic mucosa.  No masses, polyps, or diverticula were seen.   RECOMMENDATIONS:  1. Continue high  fiber diet with liberal fluid intake.  2. Avoid laxatives.  3. Repeat colorectal cancer screening is recommended in the next five years     unless the patient develops any abnormal symptoms in the interim.  4. Outpatient followup as need arises in the future.                                               Anselmo Rod, M.D.    JNM/MEDQ  D:  06/06/2003  T:  06/06/2003  Job:  [id number]   cc:   Talmadge Coventry, M.D.  [address]  [place]  [place] [address]  Fax: [phone]

## 2010-06-27 NOTE — Op Note (Signed)
NAME:  CHEY, RACHELS                       ACCOUNT NO.:  [id number]   MEDICAL RECORD NO.:  [id number]                   PATIENT TYPE:  AMB   LOCATION:  NESC                                 FACILITY:  [facility]   PHYSICIAN:  Madlyn Frankel. Charlann Boxer, M.D.               DATE OF BIRTH:  Oct 06, 1940   DATE OF PROCEDURE:  08/02/2003  DATE OF DISCHARGE:                                 OPERATIVE REPORT   PREOPERATIVE DIAGNOSIS:  Left knee medial meniscal tear.   POSTOPERATIVE DIAGNOSES:  1. 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  2. A degenerative complex cleavage-type tear to the posterior horn of the     medial meniscus, extending to the mid body posteriorly.  3. Intact anterior cruciate ligament.  4. Intact lateral compartment.   PROCEDURE:  1. Left knee diagnostic arthroscopy with anterior medial synovectomy.  2. Partial subtotal medial meniscectomy involving the superior leaflet of     the posterior horn of the medial meniscus, basically removing one third     as well as the medial compartment chondroplasty.  A minor chondroplasty     is carried out in the patellofemoral joint but not chargeable.   SURGEON:  Madlyn Frankel. Charlann Boxer, M.D.   ASSISTANT:  None.   ANESTHESIA:  Local plus MAC.   DRAINS:  None.   COMPLICATIONS:  None apparent.   INDICATIONS FOR PROCEDURE:  Ms. Daniel is a very pleasant 70 year old  female who had been followed for some time with left shoulder problems,  status post rotator cuff repair.  She presents to the office with new onset  of knee pain after a twisting-type injury.  She had medial joint line  tenderness and MRI that confirmed a medial meniscal tear.  After reviewing  the risks and benefits of the left knee arthroscopy, she consents for the  procedure.   PROCEDURE IN DETAIL:  The patient is brought  to the operating theater.  Once  adequate anesthesia and preoperative antibiotics were administered, the  patient was positioned supine with her left leg in the leg holder.  The left  lower extremity is then prepped and draped in a sterile fashion.  Then  standard inferior lateral, superolateral, and inferior medial portals were  utilized.  Diagnostic evaluation of the knee was carried out, revealing the  above findings.  A 3.5 shaver were introduced into the medial femoral portal  for debridement of the superior leaflet.  The posterior horn of the medial  meniscus was probed, and evaluation of the remaining meniscus revealed an  intact leaflet that was stable.  Further evaluation revealed a very minor  central fraying at the lateral meniscus but not anything significant;  however, a 3.5 shaver was used to debride this edge.  Not a chargeable  debridement. A synovectomy was carried out as well for exposure, and debride  the anterior medial synovial shelf that was impinging into the  patellofemoral joint medially.  The patellofemoral joint had relatively  stable grade 2-3 chondromalacia primarily in the femoral trochlea.  Following this, the knee was irrigated.  Final examination of the knee  revealed no loose fragments of cartilage.  The portals are reapproximated  using a 3-0 nylon.  The knee was then dressed in a sterile bulky dressing.  Patient was then transferred to the recovery room in stable condition.                                               Madlyn Frankel Charlann Boxer, M.D.    MDO/MEDQ  D:  08/02/2003  T:  08/02/2003  Job:  [id number]

## 2011-04-13 ENCOUNTER — Other Ambulatory Visit (HOSPITAL_COMMUNITY): Payer: Self-pay | Admitting: Family Medicine

## 2011-04-13 DIAGNOSIS — Z1231 Encounter for screening mammogram for malignant neoplasm of breast: Secondary | ICD-10-CM

## 2011-05-20 ENCOUNTER — Ambulatory Visit (HOSPITAL_COMMUNITY): Payer: Medicare Other

## 2011-06-12 ENCOUNTER — Ambulatory Visit (HOSPITAL_COMMUNITY)
Admission: RE | Admit: 2011-06-12 | Discharge: 2011-06-12 | Disposition: A | Discharge status: Home / Self Care | Payer: Medicare Other | Source: Ambulatory Visit | Attending: Family Medicine | Admitting: Family Medicine

## 2011-06-12 DIAGNOSIS — Z1231 Encounter for screening mammogram for malignant neoplasm of breast: Secondary | ICD-10-CM | POA: Insufficient documentation

## 2012-05-05 ENCOUNTER — Other Ambulatory Visit: Payer: Self-pay | Admitting: Family Medicine

## 2012-05-05 DIAGNOSIS — Z1231 Encounter for screening mammogram for malignant neoplasm of breast: Secondary | ICD-10-CM

## 2012-06-13 ENCOUNTER — Ambulatory Visit (HOSPITAL_COMMUNITY): Payer: Medicare Other

## 2012-06-15 ENCOUNTER — Ambulatory Visit (HOSPITAL_COMMUNITY)
Admission: RE | Admit: 2012-06-15 | Discharge: 2012-06-15 | Disposition: A | Discharge status: Home / Self Care | Payer: Medicare Other | Source: Ambulatory Visit | Attending: Family Medicine | Admitting: Family Medicine

## 2012-06-15 DIAGNOSIS — Z1231 Encounter for screening mammogram for malignant neoplasm of breast: Secondary | ICD-10-CM

## 2012-07-01 ENCOUNTER — Other Ambulatory Visit: Payer: Self-pay | Admitting: Family Medicine

## 2012-07-01 ENCOUNTER — Other Ambulatory Visit: Payer: Medicare Other

## 2012-07-01 LAB — COMPLETE METABOLIC PANEL WITH GFR
ALT: 14 U/L (ref 0–35)
AST: 16 U/L (ref 0–37)
Albumin: 3.8 g/dL (ref 3.5–5.2)
Alkaline Phosphatase: 49 U/L (ref 39–117)
BUN: 12 mg/dL (ref 6–23)
CO2: 26 mEq/L (ref 19–32)
Calcium: 9.2 mg/dL (ref 8.4–10.5)
Chloride: 106 mEq/L (ref 96–112)
Creat: 0.95 mg/dL (ref 0.50–1.10)
GFR, Est African American: 69 mL/min
GFR, Est Non African American: 60 mL/min
Glucose, Bld: 105 mg/dL — ABNORMAL HIGH (ref 70–99)
Potassium: 4.8 mEq/L (ref 3.5–5.3)
Sodium: 141 mEq/L (ref 135–145)
Total Bilirubin: 0.5 mg/dL (ref 0.3–1.2)
Total Protein: 6.8 g/dL (ref 6.0–8.3)

## 2012-07-01 LAB — LIPID PANEL
Cholesterol: 252 mg/dL — ABNORMAL HIGH (ref 0–200)
HDL: 53 mg/dL (ref >39)
LDL Cholesterol: 160 mg/dL — ABNORMAL HIGH (ref 0–99)
Total CHOL/HDL Ratio: 4.8 Ratio
Triglycerides: 193 mg/dL — ABNORMAL HIGH (ref <150)
VLDL: 39 mg/dL (ref 0–40)

## 2012-07-01 LAB — TSH: TSH: 0.163 u[IU]/mL — ABNORMAL LOW (ref 0.350–4.500)

## 2012-07-01 LAB — T4, FREE: Free T4: 1.64 ng/dL (ref 0.80–1.80)

## 2012-07-02 LAB — VITAMIN D 25 HYDROXY (VIT D DEFICIENCY, FRACTURES): Vit D, 25-Hydroxy: 39 ng/mL (ref 30–89)

## 2012-07-05 ENCOUNTER — Ambulatory Visit (INDEPENDENT_AMBULATORY_CARE_PROVIDER_SITE_OTHER): Payer: Medicare Other | Admitting: Family Medicine

## 2012-07-05 ENCOUNTER — Encounter: Payer: Self-pay | Admitting: *Deleted

## 2012-07-05 ENCOUNTER — Encounter: Payer: Self-pay | Admitting: Family Medicine

## 2012-07-05 VITALS — BP 120/71 | HR 72 | Wt 198.0 lb

## 2012-07-05 DIAGNOSIS — M129 Arthropathy, unspecified: Secondary | ICD-10-CM

## 2012-07-05 DIAGNOSIS — E039 Hypothyroidism, unspecified: Secondary | ICD-10-CM

## 2012-07-05 DIAGNOSIS — E785 Hyperlipidemia, unspecified: Secondary | ICD-10-CM

## 2012-07-05 DIAGNOSIS — J45909 Unspecified asthma, uncomplicated: Secondary | ICD-10-CM

## 2012-07-05 DIAGNOSIS — G479 Sleep disorder, unspecified: Secondary | ICD-10-CM

## 2012-07-05 DIAGNOSIS — M1990 Unspecified osteoarthritis, unspecified site: Secondary | ICD-10-CM

## 2012-07-05 DIAGNOSIS — J309 Allergic rhinitis, unspecified: Secondary | ICD-10-CM

## 2012-07-05 DIAGNOSIS — I10 Essential (primary) hypertension: Secondary | ICD-10-CM

## 2012-07-05 MED ORDER — VALSARTAN 320 MG PO TABS: 320.0000 mg | ORAL_TABLET | Freq: Every day | ORAL | Status: DC

## 2012-07-05 MED ORDER — ALBUTEROL SULFATE HFA 108 (90 BASE) MCG/ACT IN AERS: 2.0000 | INHALATION_SPRAY | Freq: Four times a day (QID) | RESPIRATORY_TRACT | Status: DC | PRN

## 2012-07-05 MED ORDER — MONTELUKAST SODIUM 10 MG PO TABS: 10.0000 mg | ORAL_TABLET | Freq: Every day | ORAL | Status: DC

## 2012-07-05 MED ORDER — ZOLPIDEM TARTRATE 10 MG PO TABS: 10.0000 mg | ORAL_TABLET | Freq: Every evening | ORAL | Status: DC | PRN

## 2012-07-05 MED ORDER — TIOTROPIUM BROMIDE MONOHYDRATE 18 MCG IN CAPS: 18.0000 ug | ORAL_CAPSULE | Freq: Every day | RESPIRATORY_TRACT | Status: DC

## 2012-07-05 MED ORDER — TRAMADOL HCL 50 MG PO TABS: ORAL_TABLET | ORAL | Status: DC

## 2012-07-05 MED ORDER — LEVOTHYROXINE SODIUM 88 MCG PO TABS: 88.0000 ug | ORAL_TABLET | Freq: Every day | ORAL | Status: DC

## 2012-07-05 MED ORDER — ROSUVASTATIN CALCIUM 40 MG PO TABS: 40.0000 mg | ORAL_TABLET | Freq: Every day | ORAL | Status: DC

## 2012-07-05 NOTE — Patient Instructions (Signed)
1)  Insomnia - Try a Zyrtec vs Benadryl at night to help with sleep.    Insomnia Insomnia is frequent trouble falling and/or staying asleep. Insomnia can be a long term problem or a short term problem. Both are common. Insomnia can be a short term problem when the wakefulness is related to a certain stress or worry. Long term insomnia is often related to ongoing stress during waking hours and/or poor sleeping habits. Overtime, sleep deprivation itself can make the problem worse. Every little thing feels more severe because you are overtired and your ability to cope is decreased. CAUSES   Stress, anxiety, and depression.  Poor sleeping habits.  Distractions such as TV in the bedroom.  Naps close to bedtime.  Engaging in emotionally charged conversations before bed.  Technical reading before sleep.  Alcohol and other sedatives. They may make the problem worse. They can hurt normal sleep patterns and normal dream activity.  Stimulants such as caffeine for several hours prior to bedtime.  Pain syndromes and shortness of breath can cause insomnia.  Exercise late at night.  Changing time zones may cause sleeping problems (jet lag). It is sometimes helpful to have someone observe your sleeping patterns. They should look for periods of not breathing during the night (sleep apnea). They should also look to see how long those periods last. If you live alone or observers are uncertain, you can also be observed at a sleep clinic where your sleep patterns will be professionally monitored. Sleep apnea requires a checkup and treatment. Give your caregivers your medical history. Give your caregivers observations your family has made about your sleep.  SYMPTOMS   Not feeling rested in the morning.  Anxiety and restlessness at bedtime.  Difficulty falling and staying asleep. TREATMENT   Your caregiver may prescribe treatment for an underlying medical disorders. Your caregiver can give advice or  help if you are using alcohol or other drugs for self-medication. Treatment of underlying problems will usually eliminate insomnia problems.  Medications can be prescribed for short time use. They are generally not recommended for lengthy use.  Over-the-counter sleep medicines are not recommended for lengthy use. They can be habit forming.  You can promote easier sleeping by making lifestyle changes such as:  Using relaxation techniques that help with breathing and reduce muscle tension.  Exercising earlier in the day.  Changing your diet and the time of your last meal. No night time snacks.  Establish a regular time to go to bed.  Counseling can help with stressful problems and worry.  Soothing music and white noise may be helpful if there are background noises you cannot remove.  Stop tedious detailed work at least one hour before bedtime. HOME CARE INSTRUCTIONS   Keep a diary. Inform your caregiver about your progress. This includes any medication side effects. See your caregiver regularly. Take note of:  Times when you are asleep.  Times when you are awake during the night.  The quality of your sleep.  How you feel the next day. This information will help your caregiver care for you.  Get out of bed if you are still awake after 15 minutes. Read or do some quiet activity. Keep the lights down. Wait until you feel sleepy and go back to bed.  Keep regular sleeping and waking hours. Avoid naps.  Exercise regularly.  Avoid distractions at bedtime. Distractions include watching television or engaging in any intense or detailed activity like attempting to balance the household checkbook.  Develop  a bedtime ritual. Keep a familiar routine of bathing, brushing your teeth, climbing into bed at the same time each night, listening to soothing music. Routines increase the success of falling to sleep faster.  Use relaxation techniques. This can be using breathing and muscle tension  release routines. It can also include visualizing peaceful scenes. You can also help control troubling or intruding thoughts by keeping your mind occupied with boring or repetitive thoughts like the old concept of counting sheep. You can make it more creative like imagining planting one beautiful flower after another in your backyard garden.  During your day, work to eliminate stress. When this is not possible use some of the previous suggestions to help reduce the anxiety that accompanies stressful situations. MAKE SURE YOU:   Understand these instructions.  Will watch your condition.  Will get help right away if you are not doing well or get worse. Document Released: 01/24/2000 Document Revised: 04/20/2011 Document Reviewed: 02/23/2007 [facility] Patient Information 2014 [place], [place].

## 2012-07-05 NOTE — Progress Notes (Signed)
  Subjective:    Patient ID: [name], female    DOB: February 15, 1940, 72 y.o.   MRN: [id number]  HPI  [name] is here today to go over her most recent lab results and to follow up on the conditions listed below:  1)  Asthma:  She is doing well on the combination of Spiriva daily and Proair as needed.  She needs a refill on both.   2)  Hypertension:  Her blood pressure is well controlled on Diovan and she needs a refill on it.    3)  Arthritis:  She needs a refill on her tramadol.   4)  Hypothyroidism:  She is doing well on her current dosage of levothyroxine and needs a refill on it.    Review of Systems  Past Medical History  Diagnosis Date  . Hypertension   . Asthma   . COPD (chronic obstructive pulmonary disease)   . Hypothyroidism   . GERD (gastroesophageal reflux disease)   . MCNS (minimal change nephrotic syndrome)   . Actinic keratosis   . Squamous cell carcinoma   . Basal cell carcinoma   . Squamous cell carcinoma   . Basal cell carcinoma   . Osteopenia    Family History  Problem Relation Age of Onset  . Lymphoma Father   . Heart disease Paternal Grandfather    History   Social History Narrative   Marital Status:  Widowed      Siblings: [name]/ [name]   Children:  2;  Grandchildren:  5    Pets: None   Living Situation: Lives alone   Occupation: Retired Runner, broadcasting/film/video   Education: Engineer, maintenance (IT)    Tobacco Use/Exposure:  She quit smoking > 20 years ago after having smoked 1 1/2 ppd for over 20 years.     Alcohol Use:  Occasional (Wine)    Drug Use:  None   Diet:  Regular   Exercise:  Water Aerobic Class 3 days a week and Weights   Hobbies: Reading/ Crafts/ Biking/ Reading]       Objective:   Physical Exam  Constitutional: She appears well-nourished. No distress.  HENT:  Head: Normocephalic.  Eyes: No scleral icterus.  Neck: No thyromegaly present.  Cardiovascular: Normal rate, regular rhythm and normal heart sounds.   Pulmonary/Chest:  Effort normal and breath sounds normal.  Abdominal: There is no tenderness.  Musculoskeletal: She exhibits no edema and no tenderness.  Neurological: She is alert.  Skin: Skin is warm and dry.  Psychiatric: She has a normal mood and affect. Her behavior is normal. Judgment and thought content normal.          Assessment & Plan:

## 2012-07-07 ENCOUNTER — Other Ambulatory Visit: Payer: Self-pay | Admitting: *Deleted

## 2012-07-07 DIAGNOSIS — R809 Proteinuria, unspecified: Secondary | ICD-10-CM

## 2012-07-08 LAB — MICROALBUMIN / CREATININE URINE RATIO
Creatinine, Urine: 66.5 mg/dL
Microalb Creat Ratio: 7.5 mg/g (ref 0.0–30.0)
Microalb, Ur: 0.5 mg/dL (ref 0.00–1.89)

## 2012-07-24 ENCOUNTER — Encounter: Payer: Self-pay | Admitting: Family Medicine

## 2012-07-24 DIAGNOSIS — M1990 Unspecified osteoarthritis, unspecified site: Secondary | ICD-10-CM | POA: Insufficient documentation

## 2012-07-24 DIAGNOSIS — E782 Mixed hyperlipidemia: Secondary | ICD-10-CM | POA: Insufficient documentation

## 2012-07-24 DIAGNOSIS — I10 Essential (primary) hypertension: Secondary | ICD-10-CM | POA: Insufficient documentation

## 2012-07-24 DIAGNOSIS — G479 Sleep disorder, unspecified: Secondary | ICD-10-CM | POA: Insufficient documentation

## 2012-07-24 DIAGNOSIS — E785 Hyperlipidemia, unspecified: Secondary | ICD-10-CM | POA: Insufficient documentation

## 2012-07-24 DIAGNOSIS — J309 Allergic rhinitis, unspecified: Secondary | ICD-10-CM | POA: Insufficient documentation

## 2012-07-24 DIAGNOSIS — E039 Hypothyroidism, unspecified: Secondary | ICD-10-CM | POA: Insufficient documentation

## 2012-07-24 NOTE — Assessment & Plan Note (Signed)
Refilled her levothyroxine.

## 2012-07-24 NOTE — Assessment & Plan Note (Signed)
Refilled her Ultram.

## 2012-07-24 NOTE — Assessment & Plan Note (Signed)
Refilled her Singulair, ProAir and Spiriva

## 2012-07-24 NOTE — Assessment & Plan Note (Signed)
Encouraged her to decrease her dependence on Ambien.  She was given a refill.

## 2012-07-24 NOTE — Assessment & Plan Note (Signed)
Refilled her Crestor.   

## 2012-12-12 ENCOUNTER — Ambulatory Visit (INDEPENDENT_AMBULATORY_CARE_PROVIDER_SITE_OTHER): Payer: Medicare Other | Admitting: Family Medicine

## 2012-12-12 ENCOUNTER — Encounter (INDEPENDENT_AMBULATORY_CARE_PROVIDER_SITE_OTHER): Payer: Self-pay

## 2012-12-12 ENCOUNTER — Encounter: Payer: Self-pay | Admitting: *Deleted

## 2012-12-12 ENCOUNTER — Encounter: Payer: Self-pay | Admitting: Family Medicine

## 2012-12-12 VITALS — BP 139/91 | HR 81 | Resp 16 | Wt 207.0 lb

## 2012-12-12 DIAGNOSIS — I499 Cardiac arrhythmia, unspecified: Secondary | ICD-10-CM

## 2012-12-12 DIAGNOSIS — E876 Hypokalemia: Secondary | ICD-10-CM

## 2012-12-12 DIAGNOSIS — R609 Edema, unspecified: Secondary | ICD-10-CM

## 2012-12-12 MED ORDER — FUROSEMIDE 40 MG PO TABS: ORAL_TABLET | ORAL | Status: DC

## 2012-12-12 MED ORDER — POTASSIUM CHLORIDE CRYS ER 10 MEQ PO TBCR: EXTENDED_RELEASE_TABLET | ORAL | Status: DC

## 2012-12-12 NOTE — Progress Notes (Signed)
  Subjective:    Patient ID: [name], female    DOB: 10-Apr-1940, 72 y.o.   MRN: [id number]  HPI  [name] is here today to discuss her irregular heartbeat.  She was recently seen by her nephrologist at [facility] (Dr. Charlean Sanfilippo) who noted an irregular heartbeat.  She was told to follow up with her PCP for an EKG.  He wants her to have this before he does a kidney biopsy.    Review of Systems  Constitutional: Positive for unexpected weight change.       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   Eyes: Negative.   Respiratory: Negative.   Cardiovascular: Positive for leg swelling.       Due to kidney disease  Gastrointestinal: Negative.   Endocrine: Negative.   Genitourinary: Negative.   Musculoskeletal: Negative.   Skin: Negative.   Allergic/Immunologic: Negative.   Neurological: Negative.   Hematological: Negative.   Psychiatric/Behavioral: Negative.      Past Medical History  Diagnosis Date  . Hypertension   . Asthma   . COPD (chronic obstructive pulmonary disease)   . Hypothyroidism   . GERD (gastroesophageal reflux disease)   . MCNS (minimal change nephrotic syndrome)   . Actinic keratosis   . Osteopenia   . Squamous cell carcinoma   . Basal cell carcinoma      Family History  Problem Relation Age of Onset  . Lymphoma Father   . Heart disease Paternal Grandfather      History   Social History Narrative   Marital Status:  Widowed      Siblings: Kitty Simmons/ Hulan Fray   Children:  2;  Grandchildren:  5    Pets: None   Living Situation: Lives alone   Occupation: Retired Runner, broadcasting/film/video   Education: Engineer, maintenance (IT)    Tobacco Use/Exposure:  She quit smoking > 20 years ago after having smoked 1 1/2 ppd for over 20 years.     Alcohol Use:  Occasional (Wine)    Drug Use:  None   Diet:  Regular   Exercise:  Water Aerobic Class 3 days a week and Weights   Hobbies: Reading/ Crafts/ Biking/ Reading]       Objective:   Physical Exam  Nursing  note and vitals reviewed. Constitutional: She is oriented to person, place, and time. She appears well-developed and well-nourished.  Cardiovascular: Normal rate.   She has an occasional extra beat.    Pulmonary/Chest: Effort normal and breath sounds normal.  Musculoskeletal: She exhibits edema (Mild ).  Neurological: She is alert and oriented to person, place, and time.  Skin: Skin is warm and dry.  Psychiatric: She has a normal mood and affect.      Assessment & Plan:    [name] was seen today for irregular heartbeat.  Diagnoses and associated orders for this visit:  Irregular heart beats - EKG 12-Lead - Her EKG shows premature atrial contractions.    Low blood potassium - Discontinue: potassium chloride (K-DUR,KLOR-CON) 10 MEQ tablet; Take 1-2 tabs as needed  Edema - furosemide (LASIX) 40 MG tablet; Take 1/2- 1 tab daily

## 2012-12-12 NOTE — Patient Instructions (Signed)
1)  BP - ? Diovan vs other ARBs  (*Avapro/Cozaar). Find out what is the maximum number they will tolerate for your BP, you may want to add a B Blocker (? Carvedilol) which would help with BP, proteinuria and PACs.   2)  PAC - Stop caffeine to see if this improves.    3) Edema - Lasix as needed.      Premature Beats A premature beat is an extra heartbeat that happens earlier than normal. Premature beats are called premature atrial contractions (PACs) or premature ventricular contractions (PVCs) depending on the area of the heart where they start. CAUSES  Premature beats may be brought on by a variety of factors including:  Emotional stress.  Lack of sleep.  Caffeine.  Asthma medicines.  Stimulants.  Herbal teas.  Dietary supplements.  Alcohol. In most cases, premature beats are not dangerous and are not a sign of serious heart disease. Most patients evaluated for premature beats have completely normal heart function. Rarely, premature beats may be a sign of more significant heart problems or medical illness. SYMPTOMS  Premature beats may cause palpitations. This means you feel like your heart is skipping a beat or beating harder than usual. Sometimes, slight chest pain occurs with premature beats, lasting only a few seconds. This pain has been described as a "flopping" feeling inside the chest. In many cases, premature beats do not cause any symptoms and they are only detected when an electrocardiography test (EKG) or heart monitoring is performed. DIAGNOSIS  Your caregiver may run some tests to evaluate your heart such as an EKG or echocardiography. You may need to wear a portable heart monitor for several days to record the electrical activity of your heart. Blood testing may also be performed to check your electrolytes and thyroid function. TREATMENT  Premature beats usually go away with rest. If the problem continues, your caregiver will determine a treatment plan for you.  HOME  CARE INSTRUCTIONS  Get plenty of rest over the next few days until your symptoms improve.  Avoid coffee, tea, alcohol, and soda (pop, cola).  Do not smoke. SEEK MEDICAL CARE IF:  Your symptoms continue after 1 to 2 days of rest.  You have new symptoms, such as chest pain or trouble breathing. SEEK IMMEDIATE MEDICAL CARE IF:  You have severe chest pain or abdominal pain.  You have pain that radiates into the neck, arm, or jaw.  You faint or have extreme weakness.  You have shortness of breath.  Your heartbeat races for more than 5 seconds. MAKE SURE YOU:  Understand these instructions.  Will watch your condition.  Will get help right away if you are not doing well or get worse. Document Released: 03/05/2004 Document Revised: 04/20/2011 Document Reviewed: 09/29/2010 [facility] Patient Information 2014 [place], [place].

## 2013-01-12 ENCOUNTER — Ambulatory Visit (INDEPENDENT_AMBULATORY_CARE_PROVIDER_SITE_OTHER): Payer: Medicare Other | Admitting: Family Medicine

## 2013-01-12 ENCOUNTER — Encounter: Payer: Self-pay | Admitting: Family Medicine

## 2013-01-12 ENCOUNTER — Encounter (INDEPENDENT_AMBULATORY_CARE_PROVIDER_SITE_OTHER): Payer: Self-pay

## 2013-01-12 VITALS — BP 119/67 | HR 89 | Resp 16 | Ht 66.0 in | Wt 208.0 lb

## 2013-01-12 DIAGNOSIS — M899 Disorder of bone, unspecified: Secondary | ICD-10-CM

## 2013-01-12 DIAGNOSIS — M549 Dorsalgia, unspecified: Secondary | ICD-10-CM

## 2013-01-12 DIAGNOSIS — Z0000 Encounter for general adult medical examination without abnormal findings: Secondary | ICD-10-CM

## 2013-01-12 DIAGNOSIS — M8580 Other specified disorders of bone density and structure, unspecified site: Secondary | ICD-10-CM

## 2013-01-12 LAB — POCT URINALYSIS DIPSTICK
Bilirubin, UA: NEGATIVE
Blood, UA: NEGATIVE
Glucose, UA: NEGATIVE
Ketones, UA: NEGATIVE
Leukocytes, UA: NEGATIVE
Nitrite, UA: NEGATIVE
Protein, UA: NEGATIVE
Spec Grav, UA: 1.02
Urobilinogen, UA: NEGATIVE
pH, UA: 5

## 2013-01-12 MED ORDER — TRAMADOL HCL 50 MG PO TABS: 50.0000 mg | ORAL_TABLET | Freq: Four times a day (QID) | ORAL | Status: DC | PRN

## 2013-01-12 NOTE — Progress Notes (Signed)
Subjective:    Patient ID: [name], female    DOB: [birth year], 72 y.o.   MRN: [id number]  HPI  [name] is here today for her annual CPE.  She has done well since her last office visit. She needs a refill for her Tramadol.    1) Back/Knee Pain:  Her pain is controlled with Tramadol (50 mg, twice daily)  She needs a refill on it.      Review of Systems  Constitutional: Negative.   HENT: Negative.   Eyes: Negative.   Respiratory: Negative.   Cardiovascular: Positive for leg swelling (Pain/Cramps in both legs).  Gastrointestinal: Negative.   Endocrine: Negative.   Genitourinary: Negative.   Musculoskeletal: Positive for back pain.       Knee pain  Skin: Negative.   Allergic/Immunologic: Negative.   Neurological: Negative.   Hematological: Negative.   Psychiatric/Behavioral: Negative.      Past Medical History  Diagnosis Date  . Hypertension   . Asthma   . COPD (chronic obstructive pulmonary disease)   . Hypothyroidism   . GERD (gastroesophageal reflux disease)   . MCNS (minimal change nephrotic syndrome)   . Actinic keratosis   . Osteopenia   . Squamous cell carcinoma   . Basal cell carcinoma      Past Surgical History  Procedure Laterality Date  . Rotator cuff repair Left   . Renal biopsy      x 2  . Medial partial knee replacement Bilateral   . Abdominal hysterectomy    . Bilateral salpingoophorectomy      Ovarian Cysts   . Mohs surgery      x 2     History   Social History Narrative   Marital Status:  Widowed      Siblings: [name]/ [name]   Children:  2;  Grandchildren:  5    Pets: None   Living Situation: Lives alone   Occupation: Retired Runner, broadcasting/film/video   Education: Engineer, maintenance (IT)    Tobacco Use/Exposure:  She quit smoking > 20 years ago after having smoked 1 1/2 ppd for over 20 years.     Alcohol Use:  Occasional (Wine)    Drug Use:  None   Diet:  Regular   Exercise:  Water Aerobic Class 3 days a week and Weights   Hobbies:  Reading/ Crafts/ Biking/ Reading]     Family History  Problem Relation Age of Onset  . Lymphoma Father   . Heart disease Paternal Grandfather   . COPD Mother     Husband Smoked   . Stroke Maternal Grandfather   . Rheumatic fever Paternal Grandmother   . Hypertension Sister   . Scoliosis Daughter      Current Outpatient Prescriptions on File Prior to Visit  Medication Sig Dispense Refill  . albuterol (PROAIR HFA) 108 (90 BASE) MCG/ACT inhaler Inhale 2 puffs into the lungs every 6 (six) hours as needed for wheezing or shortness of breath.  1 Inhaler  11  . furosemide (LASIX) 40 MG tablet Take 1/2- 1 tab daily  30 tablet  1  . levothyroxine (SYNTHROID, LEVOTHROID) 88 MCG tablet Take 1 tablet (88 mcg total) by mouth daily before breakfast.  90 tablet  3  . montelukast (SINGULAIR) 10 MG tablet Take 1 tablet (10 mg total) by mouth at bedtime.  30 tablet  11  . tiotropium (SPIRIVA HANDIHALER) 18 MCG inhalation capsule Place 1 capsule (18 mcg total) into inhaler and inhale daily.  30  capsule  11  . zolpidem (AMBIEN) 10 MG tablet Take 1 tablet (10 mg total) by mouth at bedtime as needed for sleep.  30 tablet  3   No current facility-administered medications on file prior to visit.     No Known Allergies   Immunization History  Administered Date(s) Administered  . Tdap 12/09/2010  . Zoster 02/16/2008     Objective:   Physical Exam  Nursing note and vitals reviewed. Constitutional: She is oriented to person, place, and time. She appears well-developed and well-nourished.  HENT:  Head: Normocephalic.  Right Ear: External ear normal.  Left Ear: External ear normal.  Eyes: Conjunctivae and EOM are normal. Pupils are equal, round, and reactive to light.  Neck: Normal range of motion. Neck supple.  Cardiovascular: Normal rate, regular rhythm and normal heart sounds.   Pulmonary/Chest: Effort normal and breath sounds normal.  Abdominal: Soft. Bowel sounds are normal.   Genitourinary: Vagina normal and uterus normal.  Musculoskeletal:       Lumbar back: She exhibits pain.  Neurological: She is alert and oriented to person, place, and time. She has normal reflexes.  Skin: Skin is warm and dry.  Psychiatric: She has a normal mood and affect. Her behavior is normal. Judgment and thought content normal.      Assessment & Plan:   Hend was seen today for annual exam.  Diagnoses and associated orders for this visit:  Routine general medical examination at a health care facility We discussed preventtive issues for her age.  She is up to date with everything.   - POCT urinalysis dipstick  Backache - traMADol (ULTRAM) 50 MG tablet; Take 1 tablet (50 mg total) by mouth every 6 (six) hours as needed for moderate pain.  Osteopenia - DG Bone Density; Future

## 2013-01-12 NOTE — Patient Instructions (Addendum)
1)  Preventative Care -  When you get your reminder for your mammogram, tell them you want to schedule a bone density.    2)  Leg Cramps - You may want to try something called Slo- Mag  3)  URI symptoms - Get started on Zinc (Cold Eeze - up to 6 per day) plus Umcka Cold Care (2 droppers 3-4 x per day).     Preventive Care for Adults, Female A healthy lifestyle and preventive care can promote health and wellness. Preventive health guidelines for women include the following key practices.  A routine yearly physical is a good way to check with your caregiver about your health and preventive screening. It is a chance to share any concerns and updates on your health, and to receive a thorough exam.  Visit your dentist for a routine exam and preventive care every 6 months. Brush your teeth twice a day and floss once a day. Good oral hygiene prevents tooth decay and gum disease.  The frequency of eye exams is based on your age, health, family medical history, use of contact lenses, and other factors. Follow your caregiver's recommendations for frequency of eye exams.  Eat a healthy diet. Foods like vegetables, fruits, whole grains, low-fat dairy products, and lean protein foods contain the nutrients you need without too many calories. Decrease your intake of foods high in solid fats, added sugars, and salt. Eat the right amount of calories for you.Get information about a proper diet from your caregiver, if necessary.  Regular physical exercise is one of the most important things you can do for your health. Most adults should get at least 150 minutes of moderate-intensity exercise (any activity that increases your heart rate and causes you to sweat) each week. In addition, most adults need muscle-strengthening exercises on 2 or more days a week.  Maintain a healthy weight. The body mass index (BMI) is a screening tool to identify possible weight problems. It provides an estimate of body fat based on  height and weight. Your caregiver can help determine your BMI, and can help you achieve or maintain a healthy weight.For adults 20 years and older:  A BMI below 18.5 is considered underweight.  A BMI of 18.5 to 24.9 is normal.  A BMI of 25 to 29.9 is considered overweight.  A BMI of 30 and above is considered obese.  Maintain normal blood lipids and cholesterol levels by exercising and minimizing your intake of saturated fat. Eat a balanced diet with plenty of fruit and vegetables. Blood tests for lipids and cholesterol should begin at age 50 and be repeated every 5 years. If your lipid or cholesterol levels are high, you are over 50, or you are at high risk for heart disease, you may need your cholesterol levels checked more frequently.Ongoing high lipid and cholesterol levels should be treated with medicines if diet and exercise are not effective.  If you smoke, find out from your caregiver how to quit. If you do not use tobacco, do not start.  Lung cancer screening is recommended for adults aged 16 80 years who are at high risk for developing lung cancer because of a history of smoking. Yearly low-dose computed tomography (CT) is recommended for people who have at least a 30-pack-year history of smoking and are a current smoker or have quit within the past 15 years. A pack year of smoking is smoking an average of 1 pack of cigarettes a day for 1 year (for example: 1 pack  a day for 30 years or 2 packs a day for 15 years). Yearly screening should continue until the smoker has stopped smoking for at least 15 years. Yearly screening should also be stopped for people who develop a health problem that would prevent them from having lung cancer treatment.  If you are pregnant, do not drink alcohol. If you are breastfeeding, be very cautious about drinking alcohol. If you are not pregnant and choose to drink alcohol, do not exceed 1 drink per day. One drink is considered to be 12 ounces (355 mL) of  beer, 5 ounces (148 mL) of wine, or 1.5 ounces (44 mL) of liquor.  Avoid use of street drugs. Do not share needles with anyone. Ask for help if you need support or instructions about stopping the use of drugs.  High blood pressure causes heart disease and increases the risk of stroke. Your blood pressure should be checked at least every 1 to 2 years. Ongoing high blood pressure should be treated with medicines if weight loss and exercise are not effective.  If you are 47 to 73 years old, ask your caregiver if you should take aspirin to prevent strokes.  Diabetes screening involves taking a blood sample to check your fasting blood sugar level. This should be done once every 3 years, after age 66, if you are within normal weight and without risk factors for diabetes. Testing should be considered at a younger age or be carried out more frequently if you are overweight and have at least 1 risk factor for diabetes.  Breast cancer screening is essential preventive care for women. You should practice "breast self-awareness." This means understanding the normal appearance and feel of your breasts and may include breast self-examination. Any changes detected, no matter how small, should be reported to a caregiver. Women in their 55s and 30s should have a clinical breast exam (CBE) by a caregiver as part of a regular health exam every 1 to 3 years. After age 47, women should have a CBE every year. Starting at age 36, women should consider having a mammography (breast X-ray test) every year. Women who have a family history of breast cancer should talk to their caregiver about genetic screening. Women at a high risk of breast cancer should talk to their caregivers about having magnetic resonance imaging (MRI) and a mammography every year.  Breast cancer gene (BRCA)-related cancer risk assessment is recommended for women who have family members with BRCA-related cancers. BRCA-related cancers include breast, ovarian,  tubal, and peritoneal cancers. Having family members with these cancers may be associated with an increased risk for harmful changes (mutations) in the breast cancer genes BRCA1 and BRCA2. Results of the assessment will determine the need for genetic counseling and BRCA1 and BRCA2 testing.  The Pap test is a screening test for cervical cancer. A Pap test can show cell changes on the cervix that might become cervical cancer if left untreated. A Pap test is a procedure in which cells are obtained and examined from the lower end of the uterus (cervix).  Women should have a Pap test starting at age 5.  Between ages 68 and 88, Pap tests should be repeated every 2 years.  Beginning at age 37, you should have a Pap test every 3 years as long as the past 3 Pap tests have been normal.  Some women have medical problems that increase the chance of getting cervical cancer. Talk to your caregiver about these problems. It is especially important  to talk to your caregiver if a new problem develops soon after your last Pap test. In these cases, your caregiver may recommend more frequent screening and Pap tests.  The above recommendations are the same for women who have or have not gotten the vaccine for human papillomavirus (HPV).  If you had a hysterectomy for a problem that was not cancer or a condition that could lead to cancer, then you no longer need Pap tests. Even if you no longer need a Pap test, a regular exam is a good idea to make sure no other problems are starting.  If you are between ages 90 and 51, and you have had normal Pap tests going back 10 years, you no longer need Pap tests. Even if you no longer need a Pap test, a regular exam is a good idea to make sure no other problems are starting.  If you have had past treatment for cervical cancer or a condition that could lead to cancer, you need Pap tests and screening for cancer for at least 20 years after your treatment.  If Pap tests have been  discontinued, risk factors (such as a new sexual partner) need to be reassessed to determine if screening should be resumed.  The HPV test is an additional test that may be used for cervical cancer screening. The HPV test looks for the virus that can cause the cell changes on the cervix. The cells collected during the Pap test can be tested for HPV. The HPV test could be used to screen women aged 89 years and older, and should be used in women of any age who have unclear Pap test results. After the age of 36, women should have HPV testing at the same frequency as a Pap test.  Colorectal cancer can be detected and often prevented. Most routine colorectal cancer screening begins at the age of 43 and continues through age 50. However, your caregiver may recommend screening at an earlier age if you have risk factors for colon cancer. On a yearly basis, your caregiver may provide home test kits to check for hidden blood in the stool. Use of a small camera at the end of a tube, to directly examine the colon (sigmoidoscopy or colonoscopy), can detect the earliest forms of colorectal cancer. Talk to your caregiver about this at age 84, when routine screening begins. Direct examination of the colon should be repeated every 5 to 10 years through age 53, unless early forms of pre-cancerous polyps or small growths are found.  Hepatitis C blood testing is recommended for all people born from [birth year] through 1966 and any individual with known risks for hepatitis C.  Practice safe sex. Use condoms and avoid high-risk sexual practices to reduce the spread of sexually transmitted infections (STIs). STIs include gonorrhea, chlamydia, syphilis, trichomonas, herpes, HPV, and human immunodeficiency virus (HIV). Herpes, HIV, and HPV are viral illnesses that have no cure. They can result in disability, cancer, and death. Sexually active women aged 51 and younger should be checked for chlamydia. Older women with new or multiple  partners should also be tested for chlamydia. Testing for other STIs is recommended if you are sexually active and at increased risk.  Osteoporosis is a disease in which the bones lose minerals and strength with aging. This can result in serious bone fractures. The risk of osteoporosis can be identified using a bone density scan. Women ages 11 and over and women at risk for fractures or osteoporosis should discuss screening  with their caregivers. Ask your caregiver whether you should take a calcium supplement or vitamin D to reduce the rate of osteoporosis.  Menopause can be associated with physical symptoms and risks. Hormone replacement therapy is available to decrease symptoms and risks. You should talk to your caregiver about whether hormone replacement therapy is right for you.  Use sunscreen. Apply sunscreen liberally and repeatedly throughout the day. You should seek shade when your shadow is shorter than you. Protect yourself by wearing long sleeves, pants, a wide-brimmed hat, and sunglasses year round, whenever you are outdoors.  Once a month, do a whole body skin exam, using a mirror to look at the skin on your back. Notify your caregiver of new moles, moles that have irregular borders, moles that are larger than a pencil eraser, or moles that have changed in shape or color.  Stay current with required immunizations.  Influenza vaccine. All adults should be immunized every year.  Tetanus, diphtheria, and acellular pertussis (Td, Tdap) vaccine. Pregnant women should receive 1 dose of Tdap vaccine during each pregnancy. The dose should be obtained regardless of the length of time since the last dose. Immunization is preferred during the 27th to 36th week of gestation. An adult who has not previously received Tdap or who does not know her vaccine status should receive 1 dose of Tdap. This initial dose should be followed by tetanus and diphtheria toxoids (Td) booster doses every 10 years. Adults  with an unknown or incomplete history of completing a 3-dose immunization series with Td-containing vaccines should begin or complete a primary immunization series including a Tdap dose. Adults should receive a Td booster every 10 years.  Varicella vaccine. An adult without evidence of immunity to varicella should receive 2 doses or a second dose if she has previously received 1 dose. Pregnant females who do not have evidence of immunity should receive the first dose after pregnancy. This first dose should be obtained before leaving the health care facility. The second dose should be obtained 4 8 weeks after the first dose.  Human papillomavirus (HPV) vaccine. Females aged 80 26 years who have not received the vaccine previously should obtain the 3-dose series. The vaccine is not recommended for use in pregnant females. However, pregnancy testing is not needed before receiving a dose. If a female is found to be pregnant after receiving a dose, no treatment is needed. In that case, the remaining doses should be delayed until after the pregnancy. Immunization is recommended for any person with an immunocompromised condition through the age of 26 years if she did not get any or all doses earlier. During the 3-dose series, the second dose should be obtained 4 8 weeks after the first dose. The third dose should be obtained 24 weeks after the first dose and 16 weeks after the second dose.  Zoster vaccine. One dose is recommended for adults aged 8 years or older unless certain conditions are present.  Measles, mumps, and rubella (MMR) vaccine. Adults born before [birth year] generally are considered immune to measles and mumps. Adults born in [birth year] or later should have 1 or more doses of MMR vaccine unless there is a contraindication to the vaccine or there is laboratory evidence of immunity to each of the three diseases. A routine second dose of MMR vaccine should be obtained at least 28 days after the first dose for  students attending postsecondary schools, health care workers, or international travelers. People who received inactivated measles vaccine or an unknown type  of measles vaccine during 1963 1967 should receive 2 doses of MMR vaccine. People who received inactivated mumps vaccine or an unknown type of mumps vaccine before 1979 and are at high risk for mumps infection should consider immunization with 2 doses of MMR vaccine. For females of childbearing age, rubella immunity should be determined. If there is no evidence of immunity, females who are not pregnant should be vaccinated. If there is no evidence of immunity, females who are pregnant should delay immunization until after pregnancy. Unvaccinated health care workers born before [birth year] who lack laboratory evidence of measles, mumps, or rubella immunity or laboratory confirmation of disease should consider measles and mumps immunization with 2 doses of MMR vaccine or rubella immunization with 1 dose of MMR vaccine.  Pneumococcal 13-valent conjugate (PCV13) vaccine. When indicated, a person who is uncertain of her immunization history and has no record of immunization should receive the PCV13 vaccine. An adult aged 46 years or older who has certain medical conditions and has not been previously immunized should receive 1 dose of PCV13 vaccine. This PCV13 should be followed with a dose of pneumococcal polysaccharide (PPSV23) vaccine. The PPSV23 vaccine dose should be obtained at least 8 weeks after the dose of PCV13 vaccine. An adult aged 22 years or older who has certain medical conditions and previously received 1 or more doses of PPSV23 vaccine should receive 1 dose of PCV13. The PCV13 vaccine dose should be obtained 1 or more years after the last PPSV23 vaccine dose.  Pneumococcal polysaccharide (PPSV23) vaccine. When PCV13 is also indicated, PCV13 should be obtained first. All adults aged 48 years and older should be immunized. An adult younger than age 34  years who has certain medical conditions should be immunized. Any person who resides in a nursing home or long-term care facility should be immunized. An adult smoker should be immunized. People with an immunocompromised condition and certain other conditions should receive both PCV13 and PPSV23 vaccines. People with human immunodeficiency virus (HIV) infection should be immunized as soon as possible after diagnosis. Immunization during chemotherapy or radiation therapy should be avoided. Routine use of PPSV23 vaccine is not recommended for American Indians, [address], or people younger than 65 years unless there are medical conditions that require PPSV23 vaccine. When indicated, people who have unknown immunization and have no record of immunization should receive PPSV23 vaccine. One-time revaccination 5 years after the first dose of PPSV23 is recommended for people aged 77 64 years who have chronic kidney failure, nephrotic syndrome, asplenia, or immunocompromised conditions. People who received 1 2 doses of PPSV23 before age 4 years should receive another dose of PPSV23 vaccine at age 96 years or later if at least 5 years have passed since the previous dose. Doses of PPSV23 are not needed for people immunized with PPSV23 at or after age 2 years.  Meningococcal vaccine. Adults with asplenia or persistent complement component deficiencies should receive 2 doses of quadrivalent meningococcal conjugate (MenACWY-D) vaccine. The doses should be obtained at least 2 months apart. Microbiologists working with certain meningococcal bacteria, military recruits, people at risk during an outbreak, and people who travel to or live in countries with a high rate of meningitis should be immunized. A first-year college student up through age 26 years who is living in a residence hall should receive a dose if she did not receive a dose on or after her 16th birthday. Adults who have certain high-risk conditions should  receive one or more doses of vaccine.  Hepatitis A vaccine. Adults who wish to be protected from this disease, have certain high-risk conditions, work with hepatitis A-infected animals, work in hepatitis A research labs, or travel to or work in countries with a high rate of hepatitis A should be immunized. Adults who were previously unvaccinated and who anticipate close contact with an international adoptee during the first 60 days after arrival in the [place] from a country with a high rate of hepatitis A should be immunized.  Hepatitis B vaccine. Adults who wish to be protected from this disease, have certain high-risk conditions, may be exposed to blood or other infectious body fluids, are household contacts or sex partners of hepatitis B positive people, are clients or workers in certain care facilities, or travel to or work in countries with a high rate of hepatitis B should be immunized.  Haemophilus influenzae type b (Hib) vaccine. A previously unvaccinated person with asplenia or sickle cell disease or having a scheduled splenectomy should receive 1 dose of Hib vaccine. Regardless of previous immunization, a recipient of a hematopoietic stem cell transplant should receive a 3-dose series 6 12 months after her successful transplant. Hib vaccine is not recommended for adults with HIV infection. Preventive Services / Frequency Ages 6 to 27  Blood pressure check.** / Every 1 to 2 years.  Lipid and cholesterol check.** / Every 5 years beginning at age 55.  Clinical breast exam.** / Every 3 years for women in their 21s and 30s.  BRCA-related cancer risk assessment.** / For women who have family members with a BRCA-related cancer (breast, ovarian, tubal, or peritoneal cancers).  Pap test.** / Every 2 years from ages 82 through 22. Every 3 years starting at age 73 through age 27 or 70 with a history of 3 consecutive normal Pap tests.  HPV screening.** / Every 3 years from ages 12 through  ages 68 to 15 with a history of 3 consecutive normal Pap tests.  Hepatitis C blood test.** / For any individual with known risks for hepatitis C.  Skin self-exam. / Monthly.  Influenza vaccine. / Every year.  Tetanus, diphtheria, and acellular pertussis (Tdap, Td) vaccine.** / Consult your caregiver. Pregnant women should receive 1 dose of Tdap vaccine during each pregnancy. 1 dose of Td every 10 years.  Varicella vaccine.** / Consult your caregiver. Pregnant females who do not have evidence of immunity should receive the first dose after pregnancy.  HPV vaccine. / 3 doses over 6 months, if 26 and younger. The vaccine is not recommended for use in pregnant females. However, pregnancy testing is not needed before receiving a dose.  Measles, mumps, rubella (MMR) vaccine.** / You need at least 1 dose of MMR if you were born in 1957 or later. You may also need a 2nd dose. For females of childbearing age, rubella immunity should be determined. If there is no evidence of immunity, females who are not pregnant should be vaccinated. If there is no evidence of immunity, females who are pregnant should delay immunization until after pregnancy.  Pneumococcal 13-valent conjugate (PCV13) vaccine.** / Consult your caregiver.  Pneumococcal polysaccharide (PPSV23) vaccine.** / 1 to 2 doses if you smoke cigarettes or if you have certain conditions.  Meningococcal vaccine.** / 1 dose if you are age 21 to 14 years and a Orthoptist living in a residence hall, or have one of several medical conditions, you need to get vaccinated against meningococcal disease. You may also need additional booster doses.  Hepatitis A vaccine.** /  Consult your caregiver.  Hepatitis B vaccine.** / Consult your caregiver.  Haemophilus influenzae type b (Hib) vaccine.** / Consult your caregiver. Ages 47 to 31  Blood pressure check.** / Every 1 to 2 years.  Lipid and cholesterol check.** / Every 5 years beginning  at age 67.  Lung cancer screening. / Every year if you are aged 34 80 years and have a 30-pack-year history of smoking and currently smoke or have quit within the past 15 years. Yearly screening is stopped once you have quit smoking for at least 15 years or develop a health problem that would prevent you from having lung cancer treatment.  Clinical breast exam.** / Every year after age 103.  BRCA-related cancer risk assessment.** / For women who have family members with a BRCA-related cancer (breast, ovarian, tubal, or peritoneal cancers).  Mammogram.** / Every year beginning at age 64 and continuing for as long as you are in good health. Consult with your caregiver.  Pap test.** / Every 3 years starting at age 60 through age 68 or 6 with a history of 3 consecutive normal Pap tests.  HPV screening.** / Every 3 years from ages 12 through ages 69 to 42 with a history of 3 consecutive normal Pap tests.  Fecal occult blood test (FOBT) of stool. / Every year beginning at age 55 and continuing until age 32. You may not need to do this test if you get a colonoscopy every 10 years.  Flexible sigmoidoscopy or colonoscopy.** / Every 5 years for a flexible sigmoidoscopy or every 10 years for a colonoscopy beginning at age 47 and continuing until age 64.  Hepatitis C blood test.** / For all people born from [birth year] through 1966 and any individual with known risks for hepatitis C.  Skin self-exam. / Monthly.  Influenza vaccine. / Every year.  Tetanus, diphtheria, and acellular pertussis (Tdap/Td) vaccine.** / Consult your caregiver. Pregnant women should receive 1 dose of Tdap vaccine during each pregnancy. 1 dose of Td every 10 years.  Varicella vaccine.** / Consult your caregiver. Pregnant females who do not have evidence of immunity should receive the first dose after pregnancy.  Zoster vaccine.** / 1 dose for adults aged 37 years or older.  Measles, mumps, rubella (MMR) vaccine.** / You need at  least 1 dose of MMR if you were born in 1957 or later. You may also need a 2nd dose. For females of childbearing age, rubella immunity should be determined. If there is no evidence of immunity, females who are not pregnant should be vaccinated. If there is no evidence of immunity, females who are pregnant should delay immunization until after pregnancy.  Pneumococcal 13-valent conjugate (PCV13) vaccine.** / Consult your caregiver.  Pneumococcal polysaccharide (PPSV23) vaccine.** / 1 to 2 doses if you smoke cigarettes or if you have certain conditions.  Meningococcal vaccine.** / Consult your caregiver.  Hepatitis A vaccine.** / Consult your caregiver.  Hepatitis B vaccine.** / Consult your caregiver.  Haemophilus influenzae type b (Hib) vaccine.** / Consult your caregiver. Ages 23 and over  Blood pressure check.** / Every 1 to 2 years.  Lipid and cholesterol check.** / Every 5 years beginning at age 34.  Lung cancer screening. / Every year if you are aged 52 80 years and have a 30-pack-year history of smoking and currently smoke or have quit within the past 15 years. Yearly screening is stopped once you have quit smoking for at least 15 years or develop a health problem that would prevent you  from having lung cancer treatment.  Clinical breast exam.** / Every year after age 65.  BRCA-related cancer risk assessment.** / For women who have family members with a BRCA-related cancer (breast, ovarian, tubal, or peritoneal cancers).  Mammogram.** / Every year beginning at age 86 and continuing for as long as you are in good health. Consult with your caregiver.  Pap test.** / Every 3 years starting at age 56 through age 103 or 30 with a 3 consecutive normal Pap tests. Testing can be stopped between 65 and 70 with 3 consecutive normal Pap tests and no abnormal Pap or HPV tests in the past 10 years.  HPV screening.** / Every 3 years from ages 39 through ages 32 or 39 with a history of 3  consecutive normal Pap tests. Testing can be stopped between 65 and 70 with 3 consecutive normal Pap tests and no abnormal Pap or HPV tests in the past 10 years.  Fecal occult blood test (FOBT) of stool. / Every year beginning at age 38 and continuing until age 39. You may not need to do this test if you get a colonoscopy every 10 years.  Flexible sigmoidoscopy or colonoscopy.** / Every 5 years for a flexible sigmoidoscopy or every 10 years for a colonoscopy beginning at age 31 and continuing until age 3.  Hepatitis C blood test.** / For all people born from [birth year] through 1966 and any individual with known risks for hepatitis C.  Osteoporosis screening.** / A one-time screening for women ages 9 and over and women at risk for fractures or osteoporosis.  Skin self-exam. / Monthly.  Influenza vaccine. / Every year.  Tetanus, diphtheria, and acellular pertussis (Tdap/Td) vaccine.** / 1 dose of Td every 10 years.  Varicella vaccine.** / Consult your caregiver.  Zoster vaccine.** / 1 dose for adults aged 61 years or older.  Pneumococcal 13-valent conjugate (PCV13) vaccine.** / Consult your caregiver.  Pneumococcal polysaccharide (PPSV23) vaccine.** / 1 dose for all adults aged 108 years and older.  Meningococcal vaccine.** / Consult your caregiver.  Hepatitis A vaccine.** / Consult your caregiver.  Hepatitis B vaccine.** / Consult your caregiver.  Haemophilus influenzae type b (Hib) vaccine.** / Consult your caregiver. ** Family history and personal history of risk and conditions may change your caregiver's recommendations. Document Released: 03/24/2001 Document Revised: 05/23/2012 Document Reviewed: 06/23/2010 [facility] Patient Information 2014 [place], [place].

## 2013-01-17 ENCOUNTER — Encounter: Payer: Self-pay | Admitting: *Deleted

## 2013-02-21 ENCOUNTER — Ambulatory Visit (INDEPENDENT_AMBULATORY_CARE_PROVIDER_SITE_OTHER): Payer: Medicare Other | Admitting: Family Medicine

## 2013-02-21 ENCOUNTER — Encounter: Payer: Self-pay | Admitting: Family Medicine

## 2013-02-21 VITALS — BP 149/81 | HR 81 | Resp 92 | Ht 66.5 in | Wt 204.0 lb

## 2013-02-21 DIAGNOSIS — F411 Generalized anxiety disorder: Secondary | ICD-10-CM

## 2013-02-21 DIAGNOSIS — G4700 Insomnia, unspecified: Secondary | ICD-10-CM

## 2013-02-21 DIAGNOSIS — I10 Essential (primary) hypertension: Secondary | ICD-10-CM

## 2013-02-21 MED ORDER — HYDROXYZINE PAMOATE 25 MG PO CAPS: ORAL_CAPSULE | ORAL | Status: DC

## 2013-02-21 MED ORDER — VALSARTAN 320 MG PO TABS: 320.0000 mg | ORAL_TABLET | Freq: Every day | ORAL | Status: DC

## 2013-02-21 MED ORDER — CITALOPRAM HYDROBROMIDE 20 MG PO TABS: 20.0000 mg | ORAL_TABLET | Freq: Every day | ORAL | Status: DC

## 2013-02-21 NOTE — Progress Notes (Signed)
Subjective:    Patient ID: [name], female    DOB: [birth year], 73 y.o.   MRN: [id number]  HPI  [name] is here today to discuss the conditions listed below:     1)  Insomnia:  She takes Cyclosporine daily which she feels keeps her up at night.  She has taken Ambien in the past which was initially started by her nephrologist (Dr. Brayton El).  She has been out of the medication because she ran out of refills.     2)  Blood Pressure:  She keeps monitoring her blood pressure which she says is stable.  She ran out of her Diovan five days ago and she is concerned her blood pressure may be elevated.     Review of Systems  Constitutional: Negative for activity change, fatigue and unexpected weight change.  HENT: Negative.   Eyes: Negative.   Respiratory: Negative for shortness of breath.   Cardiovascular: Negative for chest pain, palpitations and leg swelling.  Gastrointestinal: Negative for diarrhea and constipation.  Endocrine: Negative.   Genitourinary: Negative for difficulty urinating.  Musculoskeletal: Negative.   Skin: Negative.   Neurological: Negative.   Hematological: Negative for adenopathy. Does not bruise/bleed easily.  Psychiatric/Behavioral: Positive for sleep disturbance. Negative for dysphoric mood. The patient is not nervous/anxious.      Past Medical History  Diagnosis Date  . Hypertension   . Asthma   . COPD (chronic obstructive pulmonary disease)   . Hypothyroidism   . GERD (gastroesophageal reflux disease)   . MCNS (minimal change nephrotic syndrome)   . Actinic keratosis   . Osteopenia   . Squamous cell carcinoma   . Basal cell carcinoma      Past Surgical History  Procedure Laterality Date  . Rotator cuff repair Left   . Renal biopsy      x 2  . Medial partial knee replacement Bilateral   . Abdominal hysterectomy    . Bilateral salpingoophorectomy      Ovarian Cysts   . Mohs surgery      x 2     History   Social History Narrative   Marital Status:  Widowed      Siblings: Kitty Simmons/ Fernanda Drum   Children:  2;  Grandchildren:  5    Pets: None   Living Situation: Lives alone   Occupation: Retired Pharmacist, hospital   Education: Forensic psychologist    Tobacco Use/Exposure:  She quit smoking > 20 years ago after having smoked 1 1/2 ppd for over 20 years.     Alcohol Use:  Occasional (Wine)    Drug Use:  None   Diet:  Regular   Exercise:  Water Aerobic Class 3 days a week and Weights   Hobbies: Reading/ Crafts/ Biking/ Reading]     Family History  Problem Relation Age of Onset  . Lymphoma Father   . Heart disease Paternal Grandfather   . COPD Mother     Husband Smoked   . Stroke Maternal Grandfather   . Rheumatic fever Paternal Grandmother   . Hypertension Sister   . Scoliosis Daughter      Current Outpatient Prescriptions on File Prior to Visit  Medication Sig Dispense Refill  . albuterol (PROAIR HFA) 108 (90 BASE) MCG/ACT inhaler Inhale 2 puffs into the lungs every 6 (six) hours as needed for wheezing or shortness of breath.  1 Inhaler  11  . cycloSPORINE modified (NEORAL) 50 MG capsule Take 50 mg by mouth 2 (two) times  daily.      . furosemide (LASIX) 40 MG tablet Take 1/2- 1 tab daily  30 tablet  1  . levothyroxine (SYNTHROID, LEVOTHROID) 88 MCG tablet Take 1 tablet (88 mcg total) by mouth daily before breakfast.  90 tablet  3  . montelukast (SINGULAIR) 10 MG tablet Take 1 tablet (10 mg total) by mouth at bedtime.  30 tablet  11  . potassium chloride (K-DUR) 10 MEQ tablet       . tiotropium (SPIRIVA HANDIHALER) 18 MCG inhalation capsule Place 1 capsule (18 mcg total) into inhaler and inhale daily.  30 capsule  11  . traMADol (ULTRAM) 50 MG tablet Take 1 tablet (50 mg total) by mouth every 6 (six) hours as needed for moderate pain.  360 tablet  1  . zolpidem (AMBIEN) 10 MG tablet Take 1 tablet (10 mg total) by mouth at bedtime as needed for sleep.  30 tablet  3   No current facility-administered medications on file  prior to visit.     No Known Allergies   Immunization History  Administered Date(s) Administered  . Tdap 12/09/2010  . Zoster 02/16/2008      Objective:   Physical Exam  Vitals reviewed. Constitutional: She is oriented to person, place, and time. She appears well-nourished. No distress.  Eyes: Conjunctivae are normal. No scleral icterus.  Neck: Neck supple. No thyromegaly present.  Cardiovascular: Normal rate, regular rhythm and normal heart sounds.   Pulmonary/Chest: Effort normal and breath sounds normal.  Musculoskeletal: She exhibits no edema and no tenderness.  Neurological: She is alert and oriented to person, place, and time.  Skin: Skin is warm and dry.  Psychiatric: Her speech is normal and behavior is normal. Judgment and thought content normal. Her mood appears anxious. Cognition and memory are normal. She exhibits a depressed mood.      Assessment & Plan:    [name] was seen today for medication management.  Diagnoses and associated orders for this visit:  Essential hypertension, benign Comments: Her BP will look better when she is back on her medication.   - valsartan (DIOVAN) 320 MG tablet; Take 1 tablet (320 mg total) by mouth daily.  Anxiety state, unspecified Comments: I feel that [name] has issues with anxiety which contributes to her insomnia.  If we treat her anxiety, she should be able to relax more at night and sleep better. She was also given some Vistaril to take as needed for increased anxiety.  She is to F/U in 3 months for a recheck.   - citalopram (CELEXA) 20 MG tablet; Take 1 tablet (20 mg total) by mouth at bedtime. - hydrOXYzine (VISTARIL) 25 MG capsule; Take 1 capsule at night as needed for increased anxiety or insomnia  Insomnia Comments: I have explained to [name] several times that Ambien is only indicated for short term use.  I did not start her on this medication and have felt somewhat pressured in the past to continue her on it. She blames  the cyclosporine for her sleeping trouble.  Since Dr. Brayton El was the first one to give this to her, I feel that if she feels that [name] should be on it long term then she can prescribe it for her.    TIME SPENT "FACE TO FACE" WITH PATIENT - 30 MINS

## 2013-02-21 NOTE — Patient Instructions (Addendum)
1)  Anxiety/Sleep - Take 1/2 of the citalopram 20 mg tab 1 hour before bedtime for 6 days then increase to 1 tab. Try to wind yourself down in the evening by limiting stimulation and try a cup of SleepyTime Tea vs Sweet Dreams.  If you need additional help you can take a Benadryl 25 mg vs Zyrtec 10 mg vs Vistaril 25 mg.  Try taking your Lasix in the morning.  F/U in 3 months for a recheck of your mood/sleep.     Insomnia Insomnia is frequent trouble falling and/or staying asleep. Insomnia can be a long term problem or a short term problem. Both are common. Insomnia can be a short term problem when the wakefulness is related to a certain stress or worry. Long term insomnia is often related to ongoing stress during waking hours and/or poor sleeping habits. Overtime, sleep deprivation itself can make the problem worse. Every little thing feels more severe because you are overtired and your ability to cope is decreased. CAUSES   Stress, anxiety, and depression.  Poor sleeping habits.  Distractions such as TV in the bedroom.  Naps close to bedtime.  Engaging in emotionally charged conversations before bed.  Technical reading before sleep.  Alcohol and other sedatives. They may make the problem worse. They can hurt normal sleep patterns and normal dream activity.  Stimulants such as caffeine for several hours prior to bedtime.  Pain syndromes and shortness of breath can cause insomnia.  Exercise late at night.  Changing time zones may cause sleeping problems (jet lag). It is sometimes helpful to have someone observe your sleeping patterns. They should look for periods of not breathing during the night (sleep apnea). They should also look to see how long those periods last. If you live alone or observers are uncertain, you can also be observed at a sleep clinic where your sleep patterns will be professionally monitored. Sleep apnea requires a checkup and treatment. Give your caregivers your  medical history. Give your caregivers observations your family has made about your sleep.  SYMPTOMS   Not feeling rested in the morning.  Anxiety and restlessness at bedtime.  Difficulty falling and staying asleep. TREATMENT   Your caregiver may prescribe treatment for an underlying medical disorders. Your caregiver can give advice or help if you are using alcohol or other drugs for self-medication. Treatment of underlying problems will usually eliminate insomnia problems.  Medications can be prescribed for short time use. They are generally not recommended for lengthy use.  Over-the-counter sleep medicines are not recommended for lengthy use. They can be habit forming.  You can promote easier sleeping by making lifestyle changes such as:  Using relaxation techniques that help with breathing and reduce muscle tension.  Exercising earlier in the day.  Changing your diet and the time of your last meal. No night time snacks.  Establish a regular time to go to bed.  Counseling can help with stressful problems and worry.  Soothing music and white noise may be helpful if there are background noises you cannot remove.  Stop tedious detailed work at least one hour before bedtime. HOME CARE INSTRUCTIONS   Keep a diary. Inform your caregiver about your progress. This includes any medication side effects. See your caregiver regularly. Take note of:  Times when you are asleep.  Times when you are awake during the night.  The quality of your sleep.  How you feel the next day. This information will help your caregiver care for you.  Get out of bed if you are still awake after 15 minutes. Read or do some quiet activity. Keep the lights down. Wait until you feel sleepy and go back to bed.  Keep regular sleeping and waking hours. Avoid naps.  Exercise regularly.  Avoid distractions at bedtime. Distractions include watching television or engaging in any intense or detailed activity  like attempting to balance the household checkbook.  Develop a bedtime ritual. Keep a familiar routine of bathing, brushing your teeth, climbing into bed at the same time each night, listening to soothing music. Routines increase the success of falling to sleep faster.  Use relaxation techniques. This can be using breathing and muscle tension release routines. It can also include visualizing peaceful scenes. You can also help control troubling or intruding thoughts by keeping your mind occupied with boring or repetitive thoughts like the old concept of counting sheep. You can make it more creative like imagining planting one beautiful flower after another in your backyard garden.  During your day, work to eliminate stress. When this is not possible use some of the previous suggestions to help reduce the anxiety that accompanies stressful situations. MAKE SURE YOU:   Understand these instructions.  Will watch your condition.  Will get help right away if you are not doing well or get worse. Document Released: 01/24/2000 Document Revised: 04/20/2011 Document Reviewed: 02/23/2007 [facility] Patient Information 2014 [place], [place].  [id number]

## 2013-02-22 ENCOUNTER — Other Ambulatory Visit: Payer: Self-pay | Admitting: Family Medicine

## 2013-04-02 DIAGNOSIS — M549 Dorsalgia, unspecified: Secondary | ICD-10-CM | POA: Insufficient documentation

## 2013-04-02 DIAGNOSIS — M8580 Other specified disorders of bone density and structure, unspecified site: Secondary | ICD-10-CM | POA: Insufficient documentation

## 2013-04-02 DIAGNOSIS — Z0000 Encounter for general adult medical examination without abnormal findings: Secondary | ICD-10-CM | POA: Insufficient documentation

## 2013-04-23 DIAGNOSIS — F411 Generalized anxiety disorder: Secondary | ICD-10-CM | POA: Insufficient documentation

## 2013-04-23 DIAGNOSIS — G4700 Insomnia, unspecified: Secondary | ICD-10-CM | POA: Insufficient documentation

## 2013-05-16 ENCOUNTER — Other Ambulatory Visit: Payer: Self-pay | Admitting: Family Medicine

## 2013-05-16 DIAGNOSIS — Z1231 Encounter for screening mammogram for malignant neoplasm of breast: Secondary | ICD-10-CM

## 2013-06-01 ENCOUNTER — Other Ambulatory Visit: Payer: Self-pay | Admitting: *Deleted

## 2013-06-01 DIAGNOSIS — F411 Generalized anxiety disorder: Secondary | ICD-10-CM

## 2013-06-01 MED ORDER — CITALOPRAM HYDROBROMIDE 20 MG PO TABS: 20.0000 mg | ORAL_TABLET | Freq: Every day | ORAL | Status: DC

## 2013-06-05 ENCOUNTER — Other Ambulatory Visit: Payer: Self-pay | Admitting: *Deleted

## 2013-06-05 DIAGNOSIS — R5381 Other malaise: Secondary | ICD-10-CM

## 2013-06-05 DIAGNOSIS — R5383 Other fatigue: Secondary | ICD-10-CM

## 2013-06-05 DIAGNOSIS — E039 Hypothyroidism, unspecified: Secondary | ICD-10-CM

## 2013-06-05 DIAGNOSIS — E785 Hyperlipidemia, unspecified: Secondary | ICD-10-CM

## 2013-06-05 DIAGNOSIS — I10 Essential (primary) hypertension: Secondary | ICD-10-CM

## 2013-06-12 ENCOUNTER — Other Ambulatory Visit: Payer: Medicare Other

## 2013-06-12 LAB — COMPLETE METABOLIC PANEL WITH GFR
ALT: 19 U/L (ref 0–35)
AST: 19 U/L (ref 0–37)
Albumin: 3.8 g/dL (ref 3.5–5.2)
Alkaline Phosphatase: 45 U/L (ref 39–117)
BUN: 15 mg/dL (ref 6–23)
CO2: 29 mEq/L (ref 19–32)
Calcium: 9.1 mg/dL (ref 8.4–10.5)
Chloride: 104 mEq/L (ref 96–112)
Creat: 1.05 mg/dL (ref 0.50–1.10)
GFR, Est African American: 61 mL/min
GFR, Est Non African American: 53 mL/min — ABNORMAL LOW
Glucose, Bld: 114 mg/dL — ABNORMAL HIGH (ref 70–99)
Potassium: 4.7 mEq/L (ref 3.5–5.3)
Sodium: 139 mEq/L (ref 135–145)
Total Bilirubin: 0.6 mg/dL (ref 0.2–1.2)
Total Protein: 6.2 g/dL (ref 6.0–8.3)

## 2013-06-12 LAB — CBC WITH DIFFERENTIAL/PLATELET
Basophils Absolute: 0 10*3/uL (ref 0.0–0.1)
Basophils Relative: 1 % (ref 0–1)
Eosinophils Absolute: 0.2 10*3/uL (ref 0.0–0.7)
Eosinophils Relative: 4 % (ref 0–5)
HCT: 38.5 % (ref 36.0–46.0)
Hemoglobin: 12.9 g/dL (ref 12.0–15.0)
Lymphocytes Relative: 27 % (ref 12–46)
Lymphs Abs: 1.1 10*3/uL (ref 0.7–4.0)
MCH: 30.1 pg (ref 26.0–34.0)
MCHC: 33.5 g/dL (ref 30.0–36.0)
MCV: 89.7 fL (ref 78.0–100.0)
Monocytes Absolute: 0.6 10*3/uL (ref 0.1–1.0)
Monocytes Relative: 15 % — ABNORMAL HIGH (ref 3–12)
Neutro Abs: 2.2 10*3/uL (ref 1.7–7.7)
Neutrophils Relative %: 53 % (ref 43–77)
Platelets: 196 10*3/uL (ref 150–400)
RBC: 4.29 MIL/uL (ref 3.87–5.11)
RDW: 14.1 % (ref 11.5–15.5)
WBC: 4.2 10*3/uL (ref 4.0–10.5)

## 2013-06-12 LAB — LIPID PANEL
Cholesterol: 177 mg/dL (ref 0–200)
HDL: 78 mg/dL (ref >39)
LDL Cholesterol: 75 mg/dL (ref 0–99)
Total CHOL/HDL Ratio: 2.3 Ratio
Triglycerides: 121 mg/dL (ref <150)
VLDL: 24 mg/dL (ref 0–40)

## 2013-06-12 LAB — TSH: TSH: 0.601 u[IU]/mL (ref 0.350–4.500)

## 2013-06-12 LAB — T4, FREE: Free T4: 1.2 ng/dL (ref 0.80–1.80)

## 2013-06-16 ENCOUNTER — Ambulatory Visit
Admission: RE | Admit: 2013-06-16 | Discharge: 2013-06-16 | Disposition: A | Discharge status: Home / Self Care | Payer: Medicare Other | Source: Ambulatory Visit | Attending: Family Medicine | Admitting: Family Medicine

## 2013-06-16 ENCOUNTER — Encounter (INDEPENDENT_AMBULATORY_CARE_PROVIDER_SITE_OTHER): Payer: Self-pay

## 2013-06-16 DIAGNOSIS — Z1231 Encounter for screening mammogram for malignant neoplasm of breast: Secondary | ICD-10-CM

## 2013-06-16 DIAGNOSIS — M8580 Other specified disorders of bone density and structure, unspecified site: Secondary | ICD-10-CM

## 2013-06-19 ENCOUNTER — Ambulatory Visit: Payer: Medicare Other | Admitting: Family Medicine

## 2013-06-25 ENCOUNTER — Other Ambulatory Visit: Payer: Self-pay | Admitting: Family Medicine

## 2013-06-26 ENCOUNTER — Other Ambulatory Visit: Payer: Self-pay | Admitting: *Deleted

## 2013-06-26 DIAGNOSIS — F411 Generalized anxiety disorder: Secondary | ICD-10-CM

## 2013-06-26 MED ORDER — CITALOPRAM HYDROBROMIDE 20 MG PO TABS: 20.0000 mg | ORAL_TABLET | Freq: Every day | ORAL | Status: DC

## 2013-06-27 ENCOUNTER — Ambulatory Visit (INDEPENDENT_AMBULATORY_CARE_PROVIDER_SITE_OTHER): Payer: Medicare Other | Admitting: Family Medicine

## 2013-06-27 ENCOUNTER — Encounter: Payer: Self-pay | Admitting: Family Medicine

## 2013-06-27 VITALS — BP 132/82 | HR 79 | Resp 16 | Ht 67.5 in | Wt 207.0 lb

## 2013-06-27 DIAGNOSIS — R070 Pain in throat: Secondary | ICD-10-CM

## 2013-06-27 DIAGNOSIS — R05 Cough: Secondary | ICD-10-CM

## 2013-06-27 DIAGNOSIS — R059 Cough, unspecified: Secondary | ICD-10-CM

## 2013-06-27 DIAGNOSIS — H9201 Otalgia, right ear: Secondary | ICD-10-CM

## 2013-06-27 DIAGNOSIS — H9209 Otalgia, unspecified ear: Secondary | ICD-10-CM

## 2013-06-27 LAB — POCT RAPID STREP A (OFFICE): Rapid Strep A Screen: NEGATIVE

## 2013-06-27 MED ORDER — HYDROCODONE-HOMATROPINE 5-1.5 MG/5ML PO SYRP: ORAL_SOLUTION | ORAL | Status: DC

## 2013-06-27 MED ORDER — BENZONATATE 200 MG PO CAPS: 200.0000 mg | ORAL_CAPSULE | Freq: Three times a day (TID) | ORAL | Status: DC | PRN

## 2013-06-27 NOTE — Patient Instructions (Addendum)
1)  Head Congestion - Zyrtec +/- Nasal Steroid Spray (Flonase or Nasacort)  2)  Throat - Warm Salt Water + Cepacol + Tylenol 1000 mg up to 3 x per day plus the sample Diclofenac for a couple of days.    3)  Chest Congestion/Cough - Mucinex DM (1200/60) 1 pill twice a day with a lot of water plus Tessalon Perles plus Hycodan 1-2 tsp at night, Vick's Vapor Rub; Ricola (Honey Lemon with Echinacea) vs Fisherman's Friend Cough Drop. Umcka  Cold Care (2 droppers up to 4 x day).        Cough, Adult  A cough is a reflex that helps clear your throat and airways. It can help heal the body or may be a reaction to an irritated airway. A cough may only last 2 or 3 weeks (acute) or may last more than 8 weeks (chronic).  CAUSES Acute cough:  Viral or bacterial infections. Chronic cough:  Infections.  Allergies.  Asthma.  Post-nasal drip.  Smoking.  Heartburn or acid reflux.  Some medicines.  Chronic lung problems (COPD).  Cancer. SYMPTOMS   Cough.  Fever.  Chest pain.  Increased breathing rate.  High-pitched whistling sound when breathing (wheezing).  Colored mucus that you cough up (sputum). TREATMENT   A bacterial cough may be treated with antibiotic medicine.  A viral cough must run its course and will not respond to antibiotics.  Your caregiver may recommend other treatments if you have a chronic cough. HOME CARE INSTRUCTIONS   Only take over-the-counter or prescription medicines for pain, discomfort, or fever as directed by your caregiver. Use cough suppressants only as directed by your caregiver.  Use a cold steam vaporizer or humidifier in your bedroom or home to help loosen secretions.  Sleep in a semi-upright position if your cough is worse at night.  Rest as needed.  Stop smoking if you smoke. SEEK IMMEDIATE MEDICAL CARE IF:   You have pus in your sputum.  Your cough starts to worsen.  You cannot control your cough with suppressants and are losing  sleep.  You begin coughing up blood.  You have difficulty breathing.  You develop pain which is getting worse or is uncontrolled with medicine.  You have a fever. MAKE SURE YOU:   Understand these instructions.  Will watch your condition.  Will get help right away if you are not doing well or get worse. Document Released: 07/25/2010 Document Revised: 04/20/2011 Document Reviewed: 07/25/2010 [facility] Patient Information 2014 [place].

## 2013-06-27 NOTE — Progress Notes (Signed)
Subjective:    Patient ID: [name], female    DOB: May 31, 1940, 73 y.o.   MRN: [id number]  HPI  [name] is here today complaining of a sore throat and right ear pain. She has been having these symptoms for the past 2 days. She describes them as being moderate in nature and she feels that they are getting worse. She has taken Zyrtec and Cold EZ which have not helped. She was down at the beach with her sisters and one of her sisters was sick with similar symptoms.     Review of Systems  Constitutional: Negative for fever, activity change and appetite change.  HENT: Positive for congestion, ear pain and sore throat.   Respiratory: Positive for cough.   All other systems reviewed and are negative.   Past Medical History  Diagnosis Date  . Hypertension   . Asthma   . COPD (chronic obstructive pulmonary disease)   . Hypothyroidism   . GERD (gastroesophageal reflux disease)   . MCNS (minimal change nephrotic syndrome)   . Actinic keratosis   . Osteopenia   . Squamous cell carcinoma   . Basal cell carcinoma      Past Surgical History  Procedure Laterality Date  . Rotator cuff repair Left   . Renal biopsy      x 2  . Medial partial knee replacement Bilateral   . Abdominal hysterectomy    . Bilateral salpingoophorectomy      Ovarian Cysts   . Mohs surgery      x 2     History   Social History Narrative   Marital Status:  Widowed      Siblings: Kitty Simmons/ Fernanda Drum   Children:  2;  Grandchildren:  5    Pets: None   Living Situation: Lives alone   Occupation: Retired Pharmacist, hospital   Education: Forensic psychologist    Tobacco Use/Exposure:  She quit smoking > 20 years ago after having smoked 1 1/2 ppd for over 20 years.     Alcohol Use:  Occasional (Wine)    Drug Use:  None   Diet:  Regular   Exercise:  Water Aerobic Class 3 days a week and Weights   Hobbies: Reading/ Crafts/ Biking/ Reading]     Family History  Problem Relation Age of Onset  . Lymphoma  Father   . Heart disease Paternal Grandfather   . COPD Mother     Husband Smoked   . Stroke Maternal Grandfather   . Rheumatic fever Paternal Grandmother   . Hypertension Sister   . Scoliosis Daughter      Current Outpatient Prescriptions on File Prior to Visit  Medication Sig Dispense Refill  . albuterol (PROAIR HFA) 108 (90 BASE) MCG/ACT inhaler Inhale 2 puffs into the lungs every 6 (six) hours as needed for wheezing or shortness of breath.  1 Inhaler  11  . citalopram (CELEXA) 20 MG tablet Take 1 tablet (20 mg total) by mouth at bedtime.  30 tablet  0  . CRESTOR 40 MG tablet TAKE ONE TABLET BY MOUTH ONE TIME DAILY   90 tablet  0  . cycloSPORINE modified (NEORAL) 50 MG capsule Take 50 mg by mouth 2 (two) times daily.      Marland Kitchen levothyroxine (SYNTHROID, LEVOTHROID) 88 MCG tablet Take 1 tablet (88 mcg total) by mouth daily before breakfast.  90 tablet  3  . montelukast (SINGULAIR) 10 MG tablet Take 1 tablet (10 mg total) by mouth at bedtime.  30 tablet  11  . tiotropium (SPIRIVA HANDIHALER) 18 MCG inhalation capsule Place 1 capsule (18 mcg total) into inhaler and inhale daily.  30 capsule  11  . traMADol (ULTRAM) 50 MG tablet Take 1 tablet (50 mg total) by mouth every 6 (six) hours as needed for moderate pain.  360 tablet  1  . valsartan (DIOVAN) 320 MG tablet Take 1 tablet (320 mg total) by mouth daily.  30 tablet  11   No current facility-administered medications on file prior to visit.     No Known Allergies   Immunization History  Administered Date(s) Administered  . Tdap 12/09/2010  . Zoster 02/16/2008       Objective:   Physical Exam  Nursing note and vitals reviewed. Constitutional: She is oriented to person, place, and time. She appears well-nourished.  Neck: Normal range of motion.  Pulmonary/Chest: Effort normal.  Musculoskeletal: Normal range of motion.  Neurological: She is alert and oriented to person, place, and time.  Skin: Skin is warm and dry.  Psychiatric:  She has a normal mood and affect. Her behavior is normal. Judgment and thought content normal.      Assessment & Plan:    [name] was seen today for sore throat and otalgia.  She appears to have a viral infection and was given medications to help lessen her symptoms.    Diagnoses and associated orders for this visit:  Cough - benzonatate (TESSALON) 200 MG capsule; Take 1 capsule (200 mg total) by mouth 3 (three) times daily as needed for cough. - HYDROcodone-homatropine (HYCODAN) 5-1.5 MG/5ML syrup; Take 1-2 tsp at bedtime  Ear pain, right  Throat pain - POCT rapid strep A (Negative).

## 2013-07-09 ENCOUNTER — Other Ambulatory Visit: Payer: Self-pay | Admitting: Family Medicine

## 2013-07-11 NOTE — Telephone Encounter (Signed)
[facility] called to see if they could get a refill for [name] where she can get 90 pills at a time, her insurance will pay for 90 days. Call them back at [phone]

## 2013-07-17 ENCOUNTER — Encounter: Payer: Self-pay | Admitting: Family Medicine

## 2013-07-17 ENCOUNTER — Ambulatory Visit (INDEPENDENT_AMBULATORY_CARE_PROVIDER_SITE_OTHER): Payer: Medicare Other | Admitting: Family Medicine

## 2013-07-17 VITALS — BP 122/77 | HR 77 | Resp 16 | Ht 66.0 in | Wt 210.0 lb

## 2013-07-17 DIAGNOSIS — F411 Generalized anxiety disorder: Secondary | ICD-10-CM

## 2013-07-17 DIAGNOSIS — E038 Other specified hypothyroidism: Secondary | ICD-10-CM

## 2013-07-17 DIAGNOSIS — M899 Disorder of bone, unspecified: Secondary | ICD-10-CM

## 2013-07-17 DIAGNOSIS — M8580 Other specified disorders of bone density and structure, unspecified site: Secondary | ICD-10-CM

## 2013-07-17 DIAGNOSIS — M545 Low back pain, unspecified: Secondary | ICD-10-CM

## 2013-07-17 DIAGNOSIS — R7301 Impaired fasting glucose: Secondary | ICD-10-CM

## 2013-07-17 DIAGNOSIS — M949 Disorder of cartilage, unspecified: Secondary | ICD-10-CM

## 2013-07-17 DIAGNOSIS — E785 Hyperlipidemia, unspecified: Secondary | ICD-10-CM

## 2013-07-17 DIAGNOSIS — J45909 Unspecified asthma, uncomplicated: Secondary | ICD-10-CM

## 2013-07-17 DIAGNOSIS — K219 Gastro-esophageal reflux disease without esophagitis: Secondary | ICD-10-CM

## 2013-07-17 DIAGNOSIS — J4489 Other specified chronic obstructive pulmonary disease: Secondary | ICD-10-CM

## 2013-07-17 DIAGNOSIS — J449 Chronic obstructive pulmonary disease, unspecified: Secondary | ICD-10-CM

## 2013-07-17 DIAGNOSIS — R070 Pain in throat: Secondary | ICD-10-CM

## 2013-07-17 LAB — POCT GLYCOSYLATED HEMOGLOBIN (HGB A1C): Hemoglobin A1C: 5.6

## 2013-07-17 MED ORDER — ROSUVASTATIN CALCIUM 40 MG PO TABS: 40.0000 mg | ORAL_TABLET | Freq: Every day | ORAL | Status: DC

## 2013-07-17 MED ORDER — LEVOTHYROXINE SODIUM 88 MCG PO TABS: 88.0000 ug | ORAL_TABLET | Freq: Every day | ORAL | Status: DC

## 2013-07-17 MED ORDER — TRAMADOL HCL 50 MG PO TABS: 50.0000 mg | ORAL_TABLET | Freq: Four times a day (QID) | ORAL | Status: DC | PRN

## 2013-07-17 MED ORDER — PANTOPRAZOLE SODIUM 40 MG PO TBEC: 40.0000 mg | DELAYED_RELEASE_TABLET | Freq: Every day | ORAL | Status: DC

## 2013-07-17 MED ORDER — TIOTROPIUM BROMIDE MONOHYDRATE 2.5 MCG/ACT IN AERS: 2.0000 | INHALATION_SPRAY | Freq: Every day | RESPIRATORY_TRACT | Status: AC

## 2013-07-17 MED ORDER — ALBUTEROL SULFATE HFA 108 (90 BASE) MCG/ACT IN AERS: 2.0000 | INHALATION_SPRAY | Freq: Four times a day (QID) | RESPIRATORY_TRACT | Status: DC | PRN

## 2013-07-17 MED ORDER — RISEDRONATE SODIUM 150 MG PO TABS: 150.0000 mg | ORAL_TABLET | ORAL | Status: AC

## 2013-07-17 MED ORDER — UMECLIDINIUM-VILANTEROL 62.5-25 MCG/INH IN AEPB: 1.0000 | INHALATION_SPRAY | Freq: Every day | RESPIRATORY_TRACT | Status: DC

## 2013-07-17 MED ORDER — CITALOPRAM HYDROBROMIDE 20 MG PO TABS: 20.0000 mg | ORAL_TABLET | Freq: Every day | ORAL | Status: DC

## 2013-07-17 MED ORDER — MONTELUKAST SODIUM 10 MG PO TABS: 10.0000 mg | ORAL_TABLET | Freq: Every day | ORAL | Status: DC

## 2013-07-17 NOTE — Progress Notes (Signed)
Subjective:    Patient ID: [name], female    DOB: [birth year], 73 y.o.   MRN: [id number]  HPI  [name] is here today to go over her recent lab results. We are going over the following issues:  1)  Hypothyroidism:  She is doing well on the levothyroxine 88 mcg. She is needing a refill.  2)  Hypertension:  Her blood pressure is well controlled on the Diovan 320 mg.   She is needing a refill.  3)  Mood:  She is doing well on the Celexa 20 mg. She is needing a refill.  4)  Hyperlipidemia:  She is doing well on the Crestor 20 mg (1/2 of the 40 mg) and needs a refill.  5)  IFG:  Her fasting glucose was 114. Her A1c is 5.6 today.   6)  Sore Throat:  She has been having a sore throat of the past month.     7)  Knee/Back Pain:  She takes tramadol 50 mg BID for her pain.    8)  COPD:  She takes Spiriva and Singulair daily.      Review of Systems  Constitutional: Negative for activity change, appetite change and fatigue.  HENT: Positive for sore throat.   Cardiovascular: Negative for chest pain, palpitations and leg swelling.  Endocrine: Negative for cold intolerance, heat intolerance, polydipsia, polyphagia and polyuria.  Genitourinary: Negative for frequency.  Psychiatric/Behavioral: Negative for behavioral problems. The patient is not nervous/anxious.   All other systems reviewed and are negative.    Past Medical History  Diagnosis Date  . Hypertension   . Asthma   . COPD (chronic obstructive pulmonary disease)   . Hypothyroidism   . GERD (gastroesophageal reflux disease)   . MCNS (minimal change nephrotic syndrome)   . Actinic keratosis   . Osteopenia   . Squamous cell carcinoma   . Basal cell carcinoma   . Hyperlipidemia      Past Surgical History  Procedure Laterality Date  . Rotator cuff repair Left   . Renal biopsy      x 2  . Medial partial knee replacement Bilateral   . Abdominal hysterectomy    . Bilateral salpingoophorectomy      Ovarian Cysts     . Mohs surgery      x 2     History   Social History Narrative   Marital Status:  Widowed      Siblings: Kitty Simmons/ Fernanda Drum   Children:  2;  Grandchildren:  5    Pets: None   Living Situation: Lives alone   Occupation: Retired Pharmacist, hospital   Education: Forensic psychologist    Tobacco Use/Exposure:  She quit smoking > 20 years ago after having smoked 1 1/2 ppd for over 20 years.     Alcohol Use:  Occasional (Wine)    Drug Use:  None   Diet:  Regular   Exercise:  Water Aerobic Class 3 days a week and Weights   Hobbies: Reading/ Crafts/ Biking/ Reading]     Family History  Problem Relation Age of Onset  . Lymphoma Father   . Heart disease Paternal Grandfather   . COPD Mother     Husband Smoked   . Stroke Maternal Grandfather   . Rheumatic fever Paternal Grandmother   . Hypertension Sister   . Scoliosis Daughter      Current Outpatient Prescriptions on File Prior to Visit  Medication Sig Dispense Refill  . cycloSPORINE modified (NEORAL)  50 MG capsule Take 50 mg by mouth 2 (two) times daily.      . valsartan (DIOVAN) 320 MG tablet Take 1 tablet (320 mg total) by mouth daily.  30 tablet  11   No current facility-administered medications on file prior to visit.     No Known Allergies   Immunization History  Administered Date(s) Administered  . Tdap 12/09/2010  . Zoster 02/16/2008       Objective:   Physical Exam  Vitals reviewed. Constitutional: She is oriented to person, place, and time. She appears well-nourished. No distress.  Eyes: Conjunctivae are normal. No scleral icterus.  Neck: Neck supple. No thyromegaly present.  Cardiovascular: Normal rate, regular rhythm and normal heart sounds.   Pulmonary/Chest: Effort normal and breath sounds normal.  Musculoskeletal: She exhibits no edema and no tenderness.  Neurological: She is alert and oriented to person, place, and time.  Skin: Skin is warm and dry.  Psychiatric: Her speech is normal and behavior is  normal. Judgment and thought content normal. Cognition and memory are normal.      Assessment & Plan:    [name] was seen today for medication management.  Diagnoses and associated orders for this visit:  Impaired fasting glucose Comments: Her A1c is 5.6%.   - POCT HgB A1C  Other specified acquired hypothyroidism - levothyroxine (SYNTHROID, LEVOTHROID) 88 MCG tablet; Take 1 tablet (88 mcg total) by mouth daily before breakfast.  Other and unspecified hyperlipidemia - rosuvastatin (CRESTOR) 40 MG tablet; Take 1 tablet (40 mg total) by mouth daily.  Throat pain Comments: If her throat pain continues, she will follow up with an ENT.    Bilateral low back pain without sciatica - traMADol (ULTRAM) 50 MG tablet; Take 1 tablet (50 mg total) by mouth every 6 (six) hours as needed for moderate pain.  Anxiety state, unspecified - citalopram (CELEXA) 20 MG tablet; Take 1 tablet (20 mg total) by mouth at bedtime.  Chronic obstructive pulmonary disease, unspecified COPD, unspecified chronic bronchitis type - Tiotropium Bromide Monohydrate (SPIRIVA RESPIMAT) 2.5 MCG/ACT AERS; Inhale 2 puffs into the lungs daily. - Umeclidinium-Vilanterol 62.5-25 MCG/INH AEPB; Inhale 1 puff into the lungs daily.  Unspecified asthma(493.90) - montelukast (SINGULAIR) 10 MG tablet; Take 1 tablet (10 mg total) by mouth at bedtime. - albuterol (PROAIR HFA) 108 (90 BASE) MCG/ACT inhaler; Inhale 2 puffs into the lungs every 6 (six) hours as needed for wheezing or shortness of breath.  Gastroesophageal reflux disease, esophagitis presence not specified - pantoprazole (PROTONIX) 40 MG tablet; Take 1 tablet (40 mg total) by mouth daily.  Osteopenia - risedronate (ACTONEL) 150 MG tablet; Take 1 tablet (150 mg total) by mouth every 30 (thirty) days. with water on empty stomach, nothing by mouth or lie down for next 30 minutes.   TIME SPENT "FACE TO FACE" WITH PATIENT -  53 MINS

## 2013-09-24 DIAGNOSIS — E038 Other specified hypothyroidism: Secondary | ICD-10-CM | POA: Insufficient documentation

## 2013-09-24 DIAGNOSIS — R7301 Impaired fasting glucose: Secondary | ICD-10-CM | POA: Insufficient documentation

## 2013-09-24 DIAGNOSIS — J449 Chronic obstructive pulmonary disease, unspecified: Secondary | ICD-10-CM | POA: Insufficient documentation

## 2013-09-24 DIAGNOSIS — R070 Pain in throat: Secondary | ICD-10-CM | POA: Insufficient documentation

## 2014-05-17 ENCOUNTER — Other Ambulatory Visit (HOSPITAL_COMMUNITY): Payer: Self-pay | Admitting: Family Medicine

## 2014-05-17 DIAGNOSIS — Z1231 Encounter for screening mammogram for malignant neoplasm of breast: Secondary | ICD-10-CM

## 2014-06-18 ENCOUNTER — Ambulatory Visit (HOSPITAL_COMMUNITY)
Admission: RE | Admit: 2014-06-18 | Discharge: 2014-06-18 | Disposition: A | Discharge status: Home / Self Care | Payer: Medicare Other | Source: Ambulatory Visit | Attending: Family Medicine | Admitting: Family Medicine

## 2014-06-18 DIAGNOSIS — Z1231 Encounter for screening mammogram for malignant neoplasm of breast: Secondary | ICD-10-CM | POA: Insufficient documentation

## 2014-06-30 NOTE — H&P (Signed)
TOTAL KNEE REVISION ADMISSION H&P  Patient is being admitted for right revision from UKA to TKA.  Subjective:  Chief Complaint:   Right knee pain s/p UKR  HPI: [name], 74 y.o. female, has a history of pain and functional disability in the right knee(s) due to failed previous arthroplasty and patient has failed non-surgical conservative treatments for greater than 12 weeks to include NSAID's and/or analgesics, supervised PT with diminished ADL's post treatment, use of assistive devices and activity modification. The indications for the revision of the total knee arthroplasty are bearing surface wear leading to symptomatic synovitis and pain. Onset of symptoms was gradual starting ~7 years ago with gradually worsening course since that time.  Prior procedures on the right knee(s) include unicompartmental arthroplasty per Dr. Alvan Dame in 2007.  Patient currently rates pain in the right knee(s) at 7 out of 10 with activity. There is night pain, worsening of pain with activity and weight bearing, pain that interferes with activities of daily living, pain with passive range of motion, crepitus and joint swelling.  Patient has evidence of periarticular osteophytes, joint space narrowing and failed previous UKR by imaging studies. This condition presents safety issues increasing the risk of falls.  There is no current active infection.  Risks, benefits and expectations were discussed with the patient.  Risks including but not limited to the risk of anesthesia, blood clots, nerve damage, blood vessel damage, failure of the prosthesis, infection and up to and including death.  Patient understand the risks, benefits and expectations and wishes to proceed with surgery.   PCP: Ival Bible, MD  D/C Plans:      Home with HHPT  Post-op Meds:       No Rx given  Tranexamic Acid:      To be given - IV   Decadron:      Is to be given  FYI:     ASA post-op  Norco post-op    Patient Active Problem List    Diagnosis Date Noted  . COLD (chronic obstructive lung disease) 09/24/2013  . Throat pain 09/24/2013  . Other specified acquired hypothyroidism 09/24/2013  . Impaired fasting glucose 09/24/2013  . Anxiety state, unspecified 04/23/2013  . Insomnia 04/23/2013  . Routine general medical examination at a health care facility 04/02/2013  . Backache 04/02/2013  . Osteopenia 04/02/2013  . Unspecified hypothyroidism 07/24/2012  . Essential hypertension, benign 07/24/2012  . Sleep disturbances 07/24/2012  . Allergic rhinitis 07/24/2012  . Arthritis 07/24/2012  . Other and unspecified hyperlipidemia 07/24/2012  . ALLERGIC RHINITIS 04/20/2007  . ASTHMA 04/20/2007  . DYSPNEA 04/20/2007   Past Medical History  Diagnosis Date  . Hypertension   . Asthma   . COPD (chronic obstructive pulmonary disease)   . Hypothyroidism   . GERD (gastroesophageal reflux disease)   . MCNS (minimal change nephrotic syndrome)   . Actinic keratosis   . Osteopenia   . Squamous cell carcinoma   . Basal cell carcinoma   . Hyperlipidemia     Past Surgical History  Procedure Laterality Date  . Rotator cuff repair Left   . Renal biopsy      x 2  . Medial partial knee replacement Bilateral   . Abdominal hysterectomy    . Bilateral salpingoophorectomy      Ovarian Cysts   . Mohs surgery      x 2    No prescriptions prior to admission   No Known Allergies  History  Substance Use Topics  .  Smoking status: Former Smoker -- 1.50 packs/day for 20 years    Types: Cigarettes    Quit date: 01/12/1973  . Smokeless tobacco: Never Used  . Alcohol Use: 0.0 oz/week    .5 drink(s) per week     Comment: She occasionally drinks a glass of wine.      Family History  Problem Relation Age of Onset  . Lymphoma Father   . Heart disease Paternal Grandfather   . COPD Mother     Husband Smoked   . Stroke Maternal Grandfather   . Rheumatic fever Paternal Grandmother   . Hypertension Sister   . Scoliosis Daughter       Review of Systems  Constitutional: Negative.   HENT: Negative.   Eyes: Negative.   Respiratory: Positive for shortness of breath (on exertion).   Cardiovascular: Negative.   Gastrointestinal: Negative.   Genitourinary: Negative.   Musculoskeletal: Positive for back pain and joint pain.  Skin: Negative.   Neurological: Negative.   Endo/Heme/Allergies: Positive for environmental allergies.  Psychiatric/Behavioral: The patient is nervous/anxious.      Objective:  Physical Exam  Constitutional: She is oriented to person, place, and time. She appears well-developed and well-nourished.  HENT:  Head: Normocephalic.  Eyes: Pupils are equal, round, and reactive to light.  Neck: Neck supple. No JVD present. No tracheal deviation present. No thyromegaly present.  Cardiovascular: Normal rate, regular rhythm, normal heart sounds and intact distal pulses.   Respiratory: Effort normal and breath sounds normal. No stridor. No respiratory distress. She has no wheezes.  GI: Soft. There is no tenderness. There is no guarding.  Musculoskeletal:       Right knee: She exhibits decreased range of motion, swelling, laceration (healed previous incision) and bony tenderness. She exhibits no ecchymosis, no deformity and no erythema. Tenderness found.  Lymphadenopathy:    She has no cervical adenopathy.  Neurological: She is alert and oriented to person, place, and time.  Skin: Skin is warm and dry.  Psychiatric: She has a normal mood and affect.     Labs:  Estimated body mass index is 33.91 kg/(m^2) as calculated from the following:   Height as of 07/17/13: 5\' 6"  (1.676 m).   Weight as of 07/17/13: 95.255 kg (210 lb).  Imaging Review Plain radiographs demonstrate moderate degenerative joint disease of the right knee with previous medial UKA. The overall alignment is neutral.There is evidence of failure of the UKA. The bone quality appears to be good for age and reported activity level.    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   The patient history, physical examination, clinical judgment of the provider and imaging studies are consistent with end stage degenerative joint disease and failure of the right knee previous unicompartmental  knee arthroplasty. Revision total knee arthroplasty is deemed medically necessary. The treatment options including medical management, injection therapy, arthroscopy and revision arthroplasty were discussed at length. The risks and benefits of revision total knee arthroplasty were presented and reviewed. The risks due to aseptic loosening, infection, stiffness, patella tracking problems, thromboembolic complications and other imponderables were discussed. The patient acknowledged the explanation, agreed to proceed with the plan and consent was signed. Patient is being admitted for inpatient treatment for surgery, pain control, PT, OT, prophylactic antibiotics, VTE prophylaxis, progressive ambulation and ADL's and discharge planning.The patient is planning to be discharged home with home health services.     West Pugh Hendryx Ricke   PA-C  06/30/2014, 9:37 AM

## 2014-07-04 ENCOUNTER — Other Ambulatory Visit: Payer: Self-pay

## 2014-07-04 ENCOUNTER — Encounter (HOSPITAL_COMMUNITY)
Admission: RE | Admit: 2014-07-04 | Discharge: 2014-07-04 | Disposition: A | Discharge status: Home / Self Care | Payer: Medicare Other | Source: Ambulatory Visit | Attending: Orthopedic Surgery | Admitting: Orthopedic Surgery

## 2014-07-04 ENCOUNTER — Encounter (HOSPITAL_COMMUNITY): Payer: Self-pay

## 2014-07-04 DIAGNOSIS — Z01810 Encounter for preprocedural cardiovascular examination: Secondary | ICD-10-CM | POA: Insufficient documentation

## 2014-07-04 DIAGNOSIS — Z01812 Encounter for preprocedural laboratory examination: Secondary | ICD-10-CM | POA: Insufficient documentation

## 2014-07-04 DIAGNOSIS — I10 Essential (primary) hypertension: Secondary | ICD-10-CM | POA: Diagnosis not present

## 2014-07-04 HISTORY — DX: Cardiac murmur, unspecified: R01.1

## 2014-07-04 HISTORY — DX: Reserved for inherently not codable concepts without codable children: IMO0001

## 2014-07-04 HISTORY — DX: Unspecified osteoarthritis, unspecified site: M19.90

## 2014-07-04 LAB — BASIC METABOLIC PANEL
Anion gap: 10 (ref 5–15)
BUN: 19 mg/dL (ref 6–20)
CO2: 29 mmol/L (ref 22–32)
Calcium: 9.5 mg/dL (ref 8.9–10.3)
Chloride: 96 mmol/L — ABNORMAL LOW (ref 101–111)
Creatinine, Ser: 0.87 mg/dL (ref 0.44–1.00)
GFR calc Af Amer: >60 mL/min (ref >60)
GFR calc non Af Amer: >60 mL/min (ref >60)
Glucose, Bld: 96 mg/dL (ref 65–99)
Potassium: 4.5 mmol/L (ref 3.5–5.1)
Sodium: 135 mmol/L (ref 135–145)

## 2014-07-04 LAB — CBC
HCT: 37.4 % (ref 36.0–46.0)
Hemoglobin: 12.1 g/dL (ref 12.0–15.0)
MCH: 29.8 pg (ref 26.0–34.0)
MCHC: 32.4 g/dL (ref 30.0–36.0)
MCV: 92.1 fL (ref 78.0–100.0)
Platelets: 253 10*3/uL (ref 150–400)
RBC: 4.06 MIL/uL (ref 3.87–5.11)
RDW: 13.6 % (ref 11.5–15.5)
WBC: 6.1 10*3/uL (ref 4.0–10.5)

## 2014-07-04 LAB — URINALYSIS, ROUTINE W REFLEX MICROSCOPIC
Bilirubin Urine: NEGATIVE
Glucose, UA: NEGATIVE mg/dL
Hgb urine dipstick: NEGATIVE
Ketones, ur: NEGATIVE mg/dL
Leukocytes, UA: NEGATIVE
Nitrite: NEGATIVE
Protein, ur: NEGATIVE mg/dL
Specific Gravity, Urine: 1.011 (ref 1.005–1.030)
Urobilinogen, UA: 0.2 mg/dL (ref 0.0–1.0)
pH: 6.5 (ref 5.0–8.0)

## 2014-07-04 LAB — ABO/RH: ABO/RH(D): A POS

## 2014-07-04 LAB — APTT: aPTT: 26 seconds (ref 24–37)

## 2014-07-04 LAB — PROTIME-INR
INR: 1.01 (ref 0.00–1.49)
Prothrombin Time: 13.5 seconds (ref 11.6–15.2)

## 2014-07-04 LAB — SURGICAL PCR SCREEN
MRSA, PCR: NEGATIVE
Staphylococcus aureus: NEGATIVE

## 2014-07-04 NOTE — Progress Notes (Signed)
Your patient has screened at an elevated risk for Obstructive Sleep Apnea using the Stop-Bang Tool during a pre-surgical vist. A score of 4 or greater is an elevated risk. Score of 4. 

## 2014-07-04 NOTE — Patient Instructions (Signed)
[place]  07/04/2014   Your procedure is scheduled on:    Tuesday Jul 10, 2014   Report to [facility] Main Entrance and follow signs to  [place] arrive at 10:00 AM.  Call this number if you have problems the morning of surgery [phone] or Presurgical Testing [phone].   Remember:  Do not eat food After Midnight but may take clear liquids till 7 am day of surgery then nothing by mouth.   For Living Will and/or Health Care Power Attorney Forms: please provide copy for your medical record, may bring AM of surgery (forms should be already notarized-we do not provide this service).   Take these medicines the morning of surgery with A SIP OF WATER: Levothyroxine;Pantoprazole (Protonix); may use ALbuterol Inhaler if needed; Symbicort Inhaler; (bring inhalers with you day of surgery); Flonase if needed (bring with you day of surgery)              STOP ALL VITAMINS AND HERBAL SUPPLEMENTS TILL AFTER PROCEDURE                             ONLY ONE PERSON WITH YOU IN SHORT STAY MORNING OF SURGERY. IF TIME PERMITS AFTER YOU ARE READY, OTHERS MAY VISIT. NO MORE THAN 2 PERSONS IN ROOM AT A TIME.                                  You may not have any metal on your body including hair pins and piercings  Do not wear jewelry, perfumes, nail polish, make-up, lotions, powders, or deodorant.  Do not shave body hair  48 hours(2 days) of CHG soap use.                Do not bring valuables to the hospital. [place].  Contacts, dentures or bridgework may not be worn into surgery.  Leave suitcase in the car. After surgery it may be brought to your room.  For patients admitted to the hospital, checkout time is 11:00 AM the day of discharge.     Special Instructions: review fact sheets for MRSA information, Blood Transfusion fact sheet, Incentive Spirometry.  Remember: Type/Screen "Blue armsbands"- may not be removed once applied(would  result in being retested AM of surgery, if removed). ________________________________________________________________________  [facility] - Preparing for Surgery Before surgery, you can play an important role.  Because skin is not sterile, your skin needs to be as free of germs as possible.  You can reduce the number of germs on your skin by washing with CHG (chlorahexidine gluconate) soap before surgery.  CHG is an antiseptic cleaner which kills germs and bonds with the skin to continue killing germs even after washing. Please DO NOT use if you have an allergy to CHG or antibacterial soaps.  If your skin becomes reddened/irritated stop using the CHG and inform your nurse when you arrive at Short Stay. Do not shave (including legs and underarms) for at least 48 hours prior to the first CHG shower.  You may shave your face/neck. Please follow these instructions carefully:  1.  Shower with CHG Soap the night before surgery and the  morning of Surgery.  2.  If you choose to wash your hair, wash your hair first as usual with your  normal  shampoo.  3.  After you shampoo, rinse your hair and body thoroughly to remove the  shampoo.                           4.  Use CHG as you would any other liquid soap.  You can apply chg directly  to the skin and wash                       Gently with a scrungie or clean washcloth.  5.  Apply the CHG Soap to your body ONLY FROM THE NECK DOWN.   Do not use on face/ open                           Wound or open sores. Avoid contact with eyes, ears mouth and genitals (private parts).                       Wash face,  Genitals (private parts) with your normal soap.             6.  Wash thoroughly, paying special attention to the area where your surgery  will be performed.  7.  Thoroughly rinse your body with warm water from the neck down.  8.  DO NOT shower/wash with your normal soap after using and rinsing off  the CHG Soap.                9.  Pat yourself dry with a  clean towel.            10.  Wear clean pajamas.            11.  Place clean sheets on your bed the night of your first shower and do not  sleep with pets. Day of Surgery : Do not apply any lotions/deodorants the morning of surgery.  Please wear clean clothes to the hospital/surgery center.  FAILURE TO FOLLOW THESE INSTRUCTIONS MAY RESULT IN THE CANCELLATION OF YOUR SURGERY PATIENT SIGNATURE_________________________________  NURSE SIGNATURE__________________________________  ________________________________________________________________________    CLEAR LIQUID DIET   Foods Allowed                                                                     Foods Excluded  Coffee and tea, regular and decaf                             liquids that you cannot  Plain Jell-O in any flavor                                             see through such as: Fruit ices (not with fruit pulp)                                     milk, soups, orange juice  Iced Popsicles  All solid food Carbonated beverages, regular and diet                                    Cranberry, grape and apple juices Sports drinks like Gatorade Lightly seasoned clear broth or consume(fat free) Sugar, honey syrup  Sample Menu Breakfast                                Lunch                                     Supper Cranberry juice                    Beef broth                            Chicken broth Jell-O                                     Grape juice                           Apple juice Coffee or tea                        Jell-O                                      Popsicle                                                Coffee or tea                        Coffee or tea  _____________________________________________________________________    Incentive Spirometer  An incentive spirometer is a tool that can help keep your lungs clear and active. This tool measures how well you are  filling your lungs with each breath. Taking long deep breaths may help reverse or decrease the chance of developing breathing (pulmonary) problems (especially infection) following:  A long period of time when you are unable to move or be active. BEFORE THE PROCEDURE   If the spirometer includes an indicator to show your best effort, your nurse or respiratory therapist will set it to a desired goal.  If possible, sit up straight or lean slightly forward. Try not to slouch.  Hold the incentive spirometer in an upright position. INSTRUCTIONS FOR USE   Sit on the edge of your bed if possible, or sit up as far as you can in bed or on a chair.  Hold the incentive spirometer in an upright position.  Breathe out normally.  Place the mouthpiece in your mouth and seal your lips tightly around it.  Breathe in slowly and as deeply as possible, raising the piston or the ball toward the top of the column.  Hold your breath for 3-5 seconds  or for as long as possible. Allow the piston or ball to fall to the bottom of the column.  Remove the mouthpiece from your mouth and breathe out normally.  Rest for a few seconds and repeat Steps 1 through 7 at least 10 times every 1-2 hours when you are awake. Take your time and take a few normal breaths between deep breaths.  The spirometer may include an indicator to show your best effort. Use the indicator as a goal to work toward during each repetition.  After each set of 10 deep breaths, practice coughing to be sure your lungs are clear. If you have an incision (the cut made at the time of surgery), support your incision when coughing by placing a pillow or rolled up towels firmly against it. Once you are able to get out of bed, walk around indoors and cough well. You may stop using the incentive spirometer when instructed by your caregiver.  RISKS AND COMPLICATIONS  Take your time so you do not get dizzy or light-headed.  If you are in pain, you may  need to take or ask for pain medication before doing incentive spirometry. It is harder to take a deep breath if you are having pain. AFTER USE  Rest and breathe slowly and easily.  It can be helpful to keep track of a log of your progress. Your caregiver can provide you with a simple table to help with this. If you are using the spirometer at home, follow these instructions: [place] IF:   You are having difficultly using the spirometer.  You have trouble using the spirometer as often as instructed.  Your pain medication is not giving enough relief while using the spirometer.  You develop fever of 100.5 F (38.1 C) or higher. SEEK IMMEDIATE MEDICAL CARE IF:   You cough up bloody sputum that had not been present before.  You develop fever of 102 F (38.9 C) or greater.  You develop worsening pain at or near the incision site. MAKE SURE YOU:   Understand these instructions.  Will watch your condition.  Will get help right away if you are not doing well or get worse. Document Released: 06/08/2006 Document Revised: 04/20/2011 Document Reviewed: 08/09/2006 ExitCare Patient Information 2014 ExitCare, [place].   ________________________________________________________________________  WHAT IS A BLOOD TRANSFUSION? Blood Transfusion Information  A transfusion is the replacement of blood or some of its parts. Blood is made up of multiple cells which provide different functions.  Red blood cells carry oxygen and are used for blood loss replacement.  White blood cells fight against infection.  Platelets control bleeding.  Plasma helps clot blood.  Other blood products are available for specialized needs, such as hemophilia or other clotting disorders. BEFORE THE TRANSFUSION  Who gives blood for transfusions?   Healthy volunteers who are fully evaluated to make sure their blood is safe. This is blood bank blood. Transfusion therapy is the safest it has ever been in  the practice of medicine. Before blood is taken from a donor, a complete history is taken to make sure that person has no history of diseases nor engages in risky social behavior (examples are intravenous drug use or sexual activity with multiple partners). The donor's travel history is screened to minimize risk of transmitting infections, such as malaria. The donated blood is tested for signs of infectious diseases, such as HIV and hepatitis. The blood is then tested to be sure it is compatible with you in order to  minimize the chance of a transfusion reaction. If you or a relative donates blood, this is often done in anticipation of surgery and is not appropriate for emergency situations. It takes many days to process the donated blood. RISKS AND COMPLICATIONS Although transfusion therapy is very safe and saves many lives, the main dangers of transfusion include:   Getting an infectious disease.  Developing a transfusion reaction. This is an allergic reaction to something in the blood you were given. Every precaution is taken to prevent this. The decision to have a blood transfusion has been considered carefully by your caregiver before blood is given. Blood is not given unless the benefits outweigh the risks. AFTER THE TRANSFUSION  Right after receiving a blood transfusion, you will usually feel much better and more energetic. This is especially true if your red blood cells have gotten low (anemic). The transfusion raises the level of the red blood cells which carry oxygen, and this usually causes an energy increase.  The nurse administering the transfusion will monitor you carefully for complications. HOME CARE INSTRUCTIONS  No special instructions are needed after a transfusion. You may find your energy is better. Speak with your caregiver about any limitations on activity for underlying diseases you may have. SEEK MEDICAL CARE IF:   Your condition is not improving after your  transfusion.  You develop redness or irritation at the intravenous (IV) site. SEEK IMMEDIATE MEDICAL CARE IF:  Any of the following symptoms occur over the next 12 hours:  Shaking chills.  You have a temperature by mouth above 102 F (38.9 C), not controlled by medicine.  Chest, back, or muscle pain.  People around you feel you are not acting correctly or are confused.  Shortness of breath or difficulty breathing.  Dizziness and fainting.  You get a rash or develop hives.  You have a decrease in urine output.  Your urine turns a dark color or changes to pink, red, or brown. Any of the following symptoms occur over the next 10 days:  You have a temperature by mouth above 102 F (38.9 C), not controlled by medicine.  Shortness of breath.  Weakness after normal activity.  The white part of the eye turns yellow (jaundice).  You have a decrease in the amount of urine or are urinating less often.  Your urine turns a dark color or changes to pink, red, or brown. Document Released: 01/24/2000 Document Revised: 04/20/2011 Document Reviewed: 09/12/2007 [facility] Patient Information 2014 [place], [place].  _______________________________________________________________________

## 2014-07-04 NOTE — Progress Notes (Addendum)
H&P per Dr Brayton El on chart 06/19/2014  Clearance note per chart per Dr Dion Saucier 06/27/2014  ECHO epic 04/28/2007 EKG per MD office 06/27/2014 on chart but not clear - had new EKG preformed.

## 2014-07-10 ENCOUNTER — Encounter (HOSPITAL_COMMUNITY)
Admission: RE | Disposition: A | Discharge status: Home Health | Payer: Self-pay | Source: Ambulatory Visit | Attending: Orthopedic Surgery

## 2014-07-10 ENCOUNTER — Inpatient Hospital Stay (HOSPITAL_COMMUNITY)
Admission: RE | Admit: 2014-07-10 | Discharge: 2014-07-12 | DRG: 468 | Disposition: A | Discharge status: Home Health | Payer: Medicare Other | Source: Ambulatory Visit | Attending: Orthopedic Surgery | Admitting: Orthopedic Surgery

## 2014-07-10 ENCOUNTER — Inpatient Hospital Stay (HOSPITAL_COMMUNITY): Payer: Medicare Other | Admitting: Anesthesiology

## 2014-07-10 ENCOUNTER — Encounter (HOSPITAL_COMMUNITY): Payer: Self-pay | Admitting: *Deleted

## 2014-07-10 DIAGNOSIS — E039 Hypothyroidism, unspecified: Secondary | ICD-10-CM | POA: Diagnosis present

## 2014-07-10 DIAGNOSIS — E669 Obesity, unspecified: Secondary | ICD-10-CM | POA: Diagnosis present

## 2014-07-10 DIAGNOSIS — G4700 Insomnia, unspecified: Secondary | ICD-10-CM | POA: Diagnosis present

## 2014-07-10 DIAGNOSIS — Z79899 Other long term (current) drug therapy: Secondary | ICD-10-CM

## 2014-07-10 DIAGNOSIS — J45909 Unspecified asthma, uncomplicated: Secondary | ICD-10-CM | POA: Diagnosis present

## 2014-07-10 DIAGNOSIS — Z6836 Body mass index (BMI) 36.0-36.9, adult: Secondary | ICD-10-CM

## 2014-07-10 DIAGNOSIS — M65861 Other synovitis and tenosynovitis, right lower leg: Secondary | ICD-10-CM | POA: Diagnosis present

## 2014-07-10 DIAGNOSIS — M1711 Unilateral primary osteoarthritis, right knee: Secondary | ICD-10-CM | POA: Diagnosis present

## 2014-07-10 DIAGNOSIS — M8580 Other specified disorders of bone density and structure, unspecified site: Secondary | ICD-10-CM | POA: Diagnosis present

## 2014-07-10 DIAGNOSIS — J449 Chronic obstructive pulmonary disease, unspecified: Secondary | ICD-10-CM | POA: Diagnosis not present

## 2014-07-10 DIAGNOSIS — I10 Essential (primary) hypertension: Secondary | ICD-10-CM | POA: Diagnosis not present

## 2014-07-10 DIAGNOSIS — Z96651 Presence of right artificial knee joint: Secondary | ICD-10-CM

## 2014-07-10 DIAGNOSIS — M25561 Pain in right knee: Secondary | ICD-10-CM | POA: Diagnosis present

## 2014-07-10 DIAGNOSIS — K219 Gastro-esophageal reflux disease without esophagitis: Secondary | ICD-10-CM | POA: Diagnosis present

## 2014-07-10 DIAGNOSIS — E785 Hyperlipidemia, unspecified: Secondary | ICD-10-CM | POA: Diagnosis present

## 2014-07-10 DIAGNOSIS — Z96659 Presence of unspecified artificial knee joint: Secondary | ICD-10-CM

## 2014-07-10 DIAGNOSIS — Z87891 Personal history of nicotine dependence: Secondary | ICD-10-CM | POA: Diagnosis not present

## 2014-07-10 DIAGNOSIS — Z01812 Encounter for preprocedural laboratory examination: Secondary | ICD-10-CM | POA: Diagnosis not present

## 2014-07-10 DIAGNOSIS — Y838 Other surgical procedures as the cause of abnormal reaction of the patient, or of later complication, without mention of misadventure at the time of the procedure: Secondary | ICD-10-CM | POA: Diagnosis present

## 2014-07-10 DIAGNOSIS — T84062A Wear of articular bearing surface of internal prosthetic right knee joint, initial encounter: Secondary | ICD-10-CM | POA: Diagnosis not present

## 2014-07-10 HISTORY — PX: CONVERSION TO TOTAL KNEE: SHX5785

## 2014-07-10 LAB — TYPE AND SCREEN
ABO/RH(D): A POS
Antibody Screen: NEGATIVE

## 2014-07-10 SURGERY — CONVERSION, ARTHROPLASTY, KNEE, PARTIAL, TO TOTAL KNEE ARTHROPLASTY
Anesthesia: Spinal | Site: Knee | Laterality: Right

## 2014-07-10 MED ORDER — FLUTICASONE PROPIONATE 50 MCG/ACT NA SUSP: 1.0000 | Freq: Every day | NASAL | Status: DC | PRN

## 2014-07-10 MED ORDER — POLYETHYLENE GLYCOL 3350 17 G PO PACK
17.0000 g | PACK | Freq: Two times a day (BID) | ORAL | Status: DC
  Administered 2014-07-10 – 2014-07-12 (×4): 17 g via ORAL

## 2014-07-10 MED ORDER — METOCLOPRAMIDE HCL 10 MG PO TABS: 5.0000 mg | ORAL_TABLET | Freq: Three times a day (TID) | ORAL | Status: DC | PRN

## 2014-07-10 MED ORDER — METHOCARBAMOL 1000 MG/10ML IJ SOLN
500.0000 mg | Freq: Four times a day (QID) | INTRAVENOUS | Status: DC | PRN
  Administered 2014-07-10: 500 mg via INTRAVENOUS
  Filled 2014-07-10 (×2): qty 5

## 2014-07-10 MED ORDER — LEVOTHYROXINE SODIUM 88 MCG PO TABS
88.0000 ug | ORAL_TABLET | Freq: Every day | ORAL | Status: DC
  Administered 2014-07-11 – 2014-07-12 (×2): 88 ug via ORAL
  Filled 2014-07-10 (×3): qty 1

## 2014-07-10 MED ORDER — METOCLOPRAMIDE HCL 5 MG/ML IJ SOLN: 5.0000 mg | Freq: Three times a day (TID) | INTRAMUSCULAR | Status: DC | PRN

## 2014-07-10 MED ORDER — DEXAMETHASONE SODIUM PHOSPHATE 10 MG/ML IJ SOLN
INTRAMUSCULAR | Status: AC
  Filled 2014-07-10: qty 1

## 2014-07-10 MED ORDER — ASPIRIN EC 325 MG PO TBEC
325.0000 mg | DELAYED_RELEASE_TABLET | Freq: Two times a day (BID) | ORAL | Status: DC
  Administered 2014-07-11 – 2014-07-12 (×3): 325 mg via ORAL
  Filled 2014-07-10 (×5): qty 1

## 2014-07-10 MED ORDER — SODIUM CHLORIDE 0.9 % IJ SOLN
INTRAMUSCULAR | Status: DC | PRN
  Administered 2014-07-10: 29 mL

## 2014-07-10 MED ORDER — PHENOL 1.4 % MT LIQD: 1.0000 | OROMUCOSAL | Status: DC | PRN

## 2014-07-10 MED ORDER — PROPOFOL 10 MG/ML IV BOLUS
INTRAVENOUS | Status: AC
  Filled 2014-07-10: qty 20

## 2014-07-10 MED ORDER — BUDESONIDE-FORMOTEROL FUMARATE 160-4.5 MCG/ACT IN AERO
2.0000 | INHALATION_SPRAY | Freq: Two times a day (BID) | RESPIRATORY_TRACT | Status: DC
  Administered 2014-07-10 – 2014-07-12 (×4): 2 via RESPIRATORY_TRACT
  Filled 2014-07-10: qty 6

## 2014-07-10 MED ORDER — POTASSIUM CHLORIDE 2 MEQ/ML IV SOLN
INTRAVENOUS | Status: DC
  Administered 2014-07-10 – 2014-07-11 (×2): via INTRAVENOUS
  Filled 2014-07-10 (×7): qty 1000

## 2014-07-10 MED ORDER — LACTATED RINGERS IV SOLN
INTRAVENOUS | Status: DC
  Administered 2014-07-10 (×2): via INTRAVENOUS
  Administered 2014-07-10: 1000 mL via INTRAVENOUS
  Administered 2014-07-10: 13:00:00 via INTRAVENOUS

## 2014-07-10 MED ORDER — HYDROMORPHONE HCL 1 MG/ML IJ SOLN
0.5000 mg | INTRAMUSCULAR | Status: DC | PRN
  Administered 2014-07-10: 1 mg via INTRAVENOUS
  Administered 2014-07-10 – 2014-07-11 (×2): 2 mg via INTRAVENOUS
  Filled 2014-07-10 (×2): qty 2
  Filled 2014-07-10: qty 1

## 2014-07-10 MED ORDER — VALSARTAN-HYDROCHLOROTHIAZIDE 320-25 MG PO TABS: 1.0000 | ORAL_TABLET | Freq: Every morning | ORAL | Status: DC

## 2014-07-10 MED ORDER — MIDAZOLAM HCL 5 MG/5ML IJ SOLN
INTRAMUSCULAR | Status: DC | PRN
  Administered 2014-07-10: 2 mg via INTRAVENOUS

## 2014-07-10 MED ORDER — CEFAZOLIN SODIUM-DEXTROSE 2-3 GM-% IV SOLR
2.0000 g | Freq: Four times a day (QID) | INTRAVENOUS | Status: AC
  Administered 2014-07-10 – 2014-07-11 (×2): 2 g via INTRAVENOUS
  Filled 2014-07-10 (×2): qty 50

## 2014-07-10 MED ORDER — DEXAMETHASONE SODIUM PHOSPHATE 10 MG/ML IJ SOLN
10.0000 mg | Freq: Once | INTRAMUSCULAR | Status: AC
  Administered 2014-07-11: 10 mg via INTRAVENOUS
  Filled 2014-07-10: qty 1

## 2014-07-10 MED ORDER — CEFAZOLIN SODIUM-DEXTROSE 2-3 GM-% IV SOLR
2.0000 g | INTRAVENOUS | Status: AC
  Administered 2014-07-10: 2 g via INTRAVENOUS

## 2014-07-10 MED ORDER — ONDANSETRON HCL 4 MG PO TABS: 4.0000 mg | ORAL_TABLET | Freq: Four times a day (QID) | ORAL | Status: DC | PRN

## 2014-07-10 MED ORDER — METHOCARBAMOL 500 MG PO TABS
500.0000 mg | ORAL_TABLET | Freq: Four times a day (QID) | ORAL | Status: DC | PRN
  Administered 2014-07-11 – 2014-07-12 (×4): 500 mg via ORAL
  Filled 2014-07-10 (×4): qty 1

## 2014-07-10 MED ORDER — CHLORHEXIDINE GLUCONATE 4 % EX LIQD: 60.0000 mL | Freq: Once | CUTANEOUS | Status: DC

## 2014-07-10 MED ORDER — DEXAMETHASONE SODIUM PHOSPHATE 10 MG/ML IJ SOLN
10.0000 mg | Freq: Once | INTRAMUSCULAR | Status: AC
  Administered 2014-07-10: 10 mg via INTRAVENOUS

## 2014-07-10 MED ORDER — BUPIVACAINE-EPINEPHRINE (PF) 0.25% -1:200000 IJ SOLN
INTRAMUSCULAR | Status: AC
  Filled 2014-07-10: qty 30

## 2014-07-10 MED ORDER — FENTANYL CITRATE (PF) 100 MCG/2ML IJ SOLN
25.0000 ug | INTRAMUSCULAR | Status: DC | PRN
  Administered 2014-07-10: 25 ug via INTRAVENOUS
  Administered 2014-07-10: 50 ug via INTRAVENOUS
  Administered 2014-07-10: 25 ug via INTRAVENOUS

## 2014-07-10 MED ORDER — KETOROLAC TROMETHAMINE 30 MG/ML IJ SOLN
INTRAMUSCULAR | Status: DC | PRN
  Administered 2014-07-10: 30 mg

## 2014-07-10 MED ORDER — CELECOXIB 200 MG PO CAPS
200.0000 mg | ORAL_CAPSULE | Freq: Two times a day (BID) | ORAL | Status: DC
Start: 2014-07-10 — End: 2014-07-11
  Filled 2014-07-10 (×3): qty 1

## 2014-07-10 MED ORDER — ALBUTEROL SULFATE (2.5 MG/3ML) 0.083% IN NEBU: 3.0000 mL | INHALATION_SOLUTION | Freq: Four times a day (QID) | RESPIRATORY_TRACT | Status: DC | PRN

## 2014-07-10 MED ORDER — BUDESONIDE-FORMOTEROL FUMARATE 160-4.5 MCG/ACT IN AERO
2.0000 | INHALATION_SPRAY | Freq: Two times a day (BID) | RESPIRATORY_TRACT | Status: DC
  Filled 2014-07-10: qty 6

## 2014-07-10 MED ORDER — HYDROMORPHONE HCL 1 MG/ML IJ SOLN
INTRAMUSCULAR | Status: AC
  Filled 2014-07-10: qty 1

## 2014-07-10 MED ORDER — BISACODYL 10 MG RE SUPP: 10.0000 mg | Freq: Every day | RECTAL | Status: DC | PRN

## 2014-07-10 MED ORDER — MAGNESIUM CITRATE PO SOLN: 1.0000 | Freq: Once | ORAL | Status: AC | PRN

## 2014-07-10 MED ORDER — FERROUS SULFATE 325 (65 FE) MG PO TABS
325.0000 mg | ORAL_TABLET | Freq: Three times a day (TID) | ORAL | Status: DC
  Administered 2014-07-10 – 2014-07-12 (×6): 325 mg via ORAL
  Filled 2014-07-10 (×8): qty 1

## 2014-07-10 MED ORDER — DIPHENHYDRAMINE HCL 25 MG PO CAPS
25.0000 mg | ORAL_CAPSULE | Freq: Four times a day (QID) | ORAL | Status: DC | PRN
  Administered 2014-07-11 (×2): 25 mg via ORAL
  Filled 2014-07-10 (×2): qty 1

## 2014-07-10 MED ORDER — MENTHOL 3 MG MT LOZG: 1.0000 | LOZENGE | OROMUCOSAL | Status: DC | PRN

## 2014-07-10 MED ORDER — HYDROCHLOROTHIAZIDE 25 MG PO TABS
25.0000 mg | ORAL_TABLET | Freq: Every day | ORAL | Status: DC
  Administered 2014-07-12: 25 mg via ORAL
  Filled 2014-07-10 (×2): qty 1

## 2014-07-10 MED ORDER — CEFAZOLIN SODIUM-DEXTROSE 2-3 GM-% IV SOLR
INTRAVENOUS | Status: AC
  Filled 2014-07-10: qty 50

## 2014-07-10 MED ORDER — ONDANSETRON HCL 4 MG/2ML IJ SOLN: 4.0000 mg | Freq: Four times a day (QID) | INTRAMUSCULAR | Status: DC | PRN

## 2014-07-10 MED ORDER — SODIUM CHLORIDE 0.9 % IV SOLN
1000.0000 mg | Freq: Once | INTRAVENOUS | Status: AC
  Administered 2014-07-10: 1000 mg via INTRAVENOUS
  Filled 2014-07-10: qty 10

## 2014-07-10 MED ORDER — HYDROMORPHONE HCL 1 MG/ML IJ SOLN
0.5000 mg | INTRAMUSCULAR | Status: DC | PRN
  Administered 2014-07-10: 1 mg via INTRAVENOUS
  Filled 2014-07-10: qty 1

## 2014-07-10 MED ORDER — FENTANYL CITRATE (PF) 100 MCG/2ML IJ SOLN
INTRAMUSCULAR | Status: DC | PRN
  Administered 2014-07-10 (×2): 50 ug via INTRAVENOUS

## 2014-07-10 MED ORDER — BUPIVACAINE-EPINEPHRINE (PF) 0.25% -1:200000 IJ SOLN
INTRAMUSCULAR | Status: DC | PRN
  Administered 2014-07-10: 30 mL

## 2014-07-10 MED ORDER — FENTANYL CITRATE (PF) 100 MCG/2ML IJ SOLN
INTRAMUSCULAR | Status: AC
  Filled 2014-07-10: qty 2

## 2014-07-10 MED ORDER — ALUM & MAG HYDROXIDE-SIMETH 200-200-20 MG/5ML PO SUSP: 30.0000 mL | ORAL | Status: DC | PRN

## 2014-07-10 MED ORDER — ROSUVASTATIN CALCIUM 20 MG PO TABS
20.0000 mg | ORAL_TABLET | Freq: Every day | ORAL | Status: DC
  Administered 2014-07-10 – 2014-07-11 (×2): 20 mg via ORAL
  Filled 2014-07-10 (×3): qty 1

## 2014-07-10 MED ORDER — PROPOFOL 10 MG/ML IV BOLUS
INTRAVENOUS | Status: DC | PRN
  Administered 2014-07-10: 30 mg via INTRAVENOUS

## 2014-07-10 MED ORDER — ONDANSETRON HCL 4 MG/2ML IJ SOLN: 4.0000 mg | Freq: Once | INTRAMUSCULAR | Status: DC | PRN

## 2014-07-10 MED ORDER — IRBESARTAN 300 MG PO TABS
300.0000 mg | ORAL_TABLET | Freq: Every day | ORAL | Status: DC
  Administered 2014-07-12: 300 mg via ORAL
  Filled 2014-07-10 (×2): qty 1

## 2014-07-10 MED ORDER — 0.9 % SODIUM CHLORIDE (POUR BTL) OPTIME
TOPICAL | Status: DC | PRN
  Administered 2014-07-10: 1000 mL

## 2014-07-10 MED ORDER — SODIUM CHLORIDE 0.9 % IJ SOLN
INTRAMUSCULAR | Status: AC
  Filled 2014-07-10: qty 50

## 2014-07-10 MED ORDER — KETOROLAC TROMETHAMINE 30 MG/ML IJ SOLN
INTRAMUSCULAR | Status: AC
  Filled 2014-07-10: qty 1

## 2014-07-10 MED ORDER — HYDROCODONE-ACETAMINOPHEN 7.5-325 MG PO TABS
1.0000 | ORAL_TABLET | ORAL | Status: DC
  Administered 2014-07-10 (×2): 1 via ORAL
  Administered 2014-07-11 – 2014-07-12 (×10): 2 via ORAL
  Filled 2014-07-10 (×3): qty 2
  Filled 2014-07-10: qty 1
  Filled 2014-07-10 (×3): qty 2
  Filled 2014-07-10: qty 1
  Filled 2014-07-10 (×5): qty 2

## 2014-07-10 MED ORDER — MIDAZOLAM HCL 2 MG/2ML IJ SOLN
INTRAMUSCULAR | Status: AC
  Filled 2014-07-10: qty 2

## 2014-07-10 MED ORDER — SODIUM CHLORIDE 0.9 % IR SOLN
Status: DC | PRN
  Administered 2014-07-10: 1000 mL

## 2014-07-10 MED ORDER — CITALOPRAM HYDROBROMIDE 20 MG PO TABS
20.0000 mg | ORAL_TABLET | Freq: Every day | ORAL | Status: DC
  Administered 2014-07-10 – 2014-07-11 (×2): 20 mg via ORAL
  Filled 2014-07-10 (×3): qty 1

## 2014-07-10 MED ORDER — PROPOFOL INFUSION 10 MG/ML OPTIME
INTRAVENOUS | Status: DC | PRN
  Administered 2014-07-10: 200 ug/kg/min via INTRAVENOUS

## 2014-07-10 MED ORDER — DOCUSATE SODIUM 100 MG PO CAPS
100.0000 mg | ORAL_CAPSULE | Freq: Two times a day (BID) | ORAL | Status: DC
  Administered 2014-07-10 – 2014-07-12 (×4): 100 mg via ORAL

## 2014-07-10 MED ORDER — MONTELUKAST SODIUM 10 MG PO TABS
10.0000 mg | ORAL_TABLET | Freq: Every day | ORAL | Status: DC
  Administered 2014-07-10 – 2014-07-11 (×2): 10 mg via ORAL
  Filled 2014-07-10 (×3): qty 1

## 2014-07-10 MED ORDER — POLYVINYL ALCOHOL 1.4 % OP SOLN: 1.0000 [drp] | Freq: Every day | OPHTHALMIC | Status: DC | PRN

## 2014-07-10 MED ORDER — HYDROMORPHONE HCL 1 MG/ML IJ SOLN
0.2500 mg | INTRAMUSCULAR | Status: DC | PRN
  Administered 2014-07-10 (×2): 0.25 mg via INTRAVENOUS
  Administered 2014-07-10: 0.5 mg via INTRAVENOUS

## 2014-07-10 MED ORDER — FENTANYL CITRATE (PF) 100 MCG/2ML IJ SOLN
INTRAMUSCULAR | Status: AC
Start: 2014-07-10 — End: 2014-07-11
  Filled 2014-07-10: qty 2

## 2014-07-10 MED ORDER — PANTOPRAZOLE SODIUM 40 MG PO TBEC
40.0000 mg | DELAYED_RELEASE_TABLET | Freq: Every day | ORAL | Status: DC
  Administered 2014-07-10 – 2014-07-12 (×3): 40 mg via ORAL
  Filled 2014-07-10 (×3): qty 1

## 2014-07-10 MED ORDER — BUPIVACAINE HCL (PF) 0.75 % IJ SOLN
INTRAMUSCULAR | Status: DC | PRN
  Administered 2014-07-10: 2 mL

## 2014-07-10 SURGICAL SUPPLY — 65 items
BAG ZIPLOCK 12X15 (MISCELLANEOUS) ×2 IMPLANT
BANDAGE ELASTIC 6 VELCRO ST LF (GAUZE/BANDAGES/DRESSINGS) ×2 IMPLANT
BANDAGE ESMARK 6X9 LF (GAUZE/BANDAGES/DRESSINGS) ×1 IMPLANT
BLADE SAW SGTL 13.0X1.19X90.0M (BLADE) ×2 IMPLANT
BNDG ESMARK 6X9 LF (GAUZE/BANDAGES/DRESSINGS) ×2
BOWL SMART MIX CTS (DISPOSABLE) ×2 IMPLANT
CEMENT HV SMART SET (Cement) ×4 IMPLANT
CEMENT RESTRICTOR DEPUY SZ 4 (Cement) ×4 IMPLANT
CUFF TOURN SGL QUICK 34 (TOURNIQUET CUFF) ×1
CUFF TRNQT CYL 34X4X40X1 (TOURNIQUET CUFF) ×1 IMPLANT
DECANTER SPIKE VIAL GLASS SM (MISCELLANEOUS) ×2 IMPLANT
DRAPE EXTREMITY T 121X128X90 (DRAPE) ×2 IMPLANT
DRAPE POUCH INSTRU U-SHP 10X18 (DRAPES) ×2 IMPLANT
DRAPE U-SHAPE 47X51 STRL (DRAPES) ×2 IMPLANT
DRSG AQUACEL AG ADV 3.5X10 (GAUZE/BANDAGES/DRESSINGS) ×2 IMPLANT
DRSG AQUACEL AG ADV 3.5X14 (GAUZE/BANDAGES/DRESSINGS) ×2 IMPLANT
DURAPREP 26ML APPLICATOR (WOUND CARE) ×2 IMPLANT
ELECT REM PT RETURN 9FT ADLT (ELECTROSURGICAL) ×2
ELECTRODE REM PT RTRN 9FT ADLT (ELECTROSURGICAL) ×1 IMPLANT
FACESHIELD WRAPAROUND (MASK) ×10 IMPLANT
FEMUR SIGMA PS SZ 3.0 R (Femur) ×2 IMPLANT
GLOVE BIOGEL PI IND STRL 6.5 (GLOVE) ×2 IMPLANT
GLOVE BIOGEL PI IND STRL 7.0 (GLOVE) ×1 IMPLANT
GLOVE BIOGEL PI IND STRL 7.5 (GLOVE) ×1 IMPLANT
GLOVE BIOGEL PI IND STRL 8.5 (GLOVE) ×1 IMPLANT
GLOVE BIOGEL PI INDICATOR 6.5 (GLOVE) ×2
GLOVE BIOGEL PI INDICATOR 7.0 (GLOVE) ×1
GLOVE BIOGEL PI INDICATOR 7.5 (GLOVE) ×1
GLOVE BIOGEL PI INDICATOR 8.5 (GLOVE) ×1
GLOVE ECLIPSE 8.0 STRL XLNG CF (GLOVE) ×4 IMPLANT
GLOVE ORTHO TXT STRL SZ7.5 (GLOVE) ×2 IMPLANT
GLOVE SURG SS PI 6.5 STRL IVOR (GLOVE) ×2 IMPLANT
GLOVE SURG SS PI 7.0 STRL IVOR (GLOVE) ×2 IMPLANT
GOWN SPEC L3 XXLG W/TWL (GOWN DISPOSABLE) ×2 IMPLANT
GOWN STRL REUS W/TWL LRG LVL3 (GOWN DISPOSABLE) ×2 IMPLANT
GOWN STRL REUS W/TWL XL LVL3 (GOWN DISPOSABLE) ×4 IMPLANT
HANDPIECE INTERPULSE COAX TIP (DISPOSABLE) ×1
KIT BASIN OR (CUSTOM PROCEDURE TRAY) ×2 IMPLANT
LIQUID BAND (GAUZE/BANDAGES/DRESSINGS) ×2 IMPLANT
MANIFOLD NEPTUNE II (INSTRUMENTS) ×2 IMPLANT
NDL SAFETY ECLIPSE 18X1.5 (NEEDLE) ×1 IMPLANT
NEEDLE HYPO 18GX1.5 SHARP (NEEDLE) ×1
NS IRRIG 1000ML POUR BTL (IV SOLUTION) ×4 IMPLANT
PACK TOTAL JOINT (CUSTOM PROCEDURE TRAY) ×2 IMPLANT
PATELLA DOME PFC 41MM (Knees) ×2 IMPLANT
PLATE ROT INSERT 12.5MM SIZE 3 (Plate) ×2 IMPLANT
POSITIONER SURGICAL ARM (MISCELLANEOUS) ×2 IMPLANT
SET HNDPC FAN SPRY TIP SCT (DISPOSABLE) ×1 IMPLANT
SET PAD KNEE POSITIONER (MISCELLANEOUS) ×2 IMPLANT
SPONGE LAP 18X18 X RAY DECT (DISPOSABLE) ×2 IMPLANT
SUCTION FRAZIER 12FR DISP (SUCTIONS) ×2 IMPLANT
SUT MNCRL AB 3-0 PS2 18 (SUTURE) ×2 IMPLANT
SUT MNCRL AB 4-0 PS2 18 (SUTURE) ×2 IMPLANT
SUT VIC AB 1 CT1 36 (SUTURE) ×6 IMPLANT
SUT VIC AB 2-0 CT1 27 (SUTURE) ×3
SUT VIC AB 2-0 CT1 TAPERPNT 27 (SUTURE) ×3 IMPLANT
SYR 50ML LL SCALE MARK (SYRINGE) ×2 IMPLANT
TOWEL OR 17X26 10 PK STRL BLUE (TOWEL DISPOSABLE) ×2 IMPLANT
TOWEL OR NON WOVEN STRL DISP B (DISPOSABLE) ×2 IMPLANT
TRAY FOLEY W/METER SILVER 14FR (SET/KITS/TRAYS/PACK) ×2 IMPLANT
TRAY REVISION SZ 3 (Knees) ×2 IMPLANT
TRAY SLEEVE CEM ML (Knees) ×2 IMPLANT
WATER STERILE IRR 1500ML POUR (IV SOLUTION) ×2 IMPLANT
WEDGE SIZE 3 5MM (Knees) ×4 IMPLANT
WRAP KNEE MAXI GEL POST OP (GAUZE/BANDAGES/DRESSINGS) ×2 IMPLANT

## 2014-07-10 NOTE — Interval H&P Note (Signed)
History and Physical Interval Note:  07/10/2014 11:01 AM  [name]  has presented today for surgery, with the diagnosis of failed righnt uni knee arthroplasty  The various methods of treatment have been discussed with the patient and family. After consideration of risks, benefits and other options for treatment, the patient has consented to  Procedure(s): RIGHT CONVERSION OF PARTIAL KNEE TO TOTAL KNEE (Right) as a surgical intervention .  The patient's history has been reviewed, patient examined, no change in status, stable for surgery.  I have reviewed the patient's chart and labs.  Questions were answered to the patient's satisfaction.     Mauri Pole

## 2014-07-10 NOTE — Anesthesia Preprocedure Evaluation (Addendum)
Anesthesia Evaluation  Patient identified by MRN, date of birth, ID band Patient awake    Reviewed: Allergy & Precautions, NPO status , Patient's Chart, lab work & pertinent test results  History of Anesthesia Complications Negative for: history of anesthetic complications  Airway Mallampati: II  TM Distance: >3 FB Neck ROM: Full    Dental no notable dental hx. (+) Dental Advisory Given   Pulmonary shortness of breath and with exertion, asthma , COPD COPD inhaler, former smoker,  breath sounds clear to auscultation  Pulmonary exam normal       Cardiovascular hypertension, Pt. on medications Normal cardiovascular exam+ Valvular Problems/Murmurs Rhythm:Regular Rate:Normal     Neuro/Psych PSYCHIATRIC DISORDERS Anxiety negative neurological ROS     GI/Hepatic Neg liver ROS, GERD-  Medicated and Controlled,  Endo/Other  Hypothyroidism obesity  Renal/GU Renal disease  negative genitourinary   Musculoskeletal  (+) Arthritis -,   Abdominal   Peds negative pediatric ROS (+)  Hematology negative hematology ROS (+)   Anesthesia Other Findings   Reproductive/Obstetrics negative OB ROS                            Anesthesia Physical Anesthesia Plan  ASA: III  Anesthesia Plan: Spinal   Post-op Pain Management:    Induction: Intravenous  Airway Management Planned:   Additional Equipment:   Intra-op Plan:   Post-operative Plan:   Informed Consent: I have reviewed the patients History and Physical, chart, labs and discussed the procedure including the risks, benefits and alternatives for the proposed anesthesia with the patient or authorized representative who has indicated his/her understanding and acceptance.   Dental advisory given  Plan Discussed with: CRNA  Anesthesia Plan Comments:        Anesthesia Quick Evaluation

## 2014-07-10 NOTE — Transfer of Care (Signed)
Immediate Anesthesia Transfer of Care Note  Patient: [name]  Procedure(s) Performed: Procedure(s): RIGHT CONVERSION OF PARTIAL KNEE TO TOTAL KNEE (Right)  Patient Location: PACU  Anesthesia Type:Regional and Spinal  Level of Consciousness: awake, sedated and patient cooperative  Airway & Oxygen Therapy: Patient Spontanous Breathing and Patient connected to face mask oxygen  Post-op Assessment: Report given to RN and Post -op Vital signs reviewed and stable  Post vital signs: Reviewed and stable  Last Vitals:  Filed Vitals:   07/10/14 0954  BP: 141/74  Pulse: 87  Temp: 36.5 C  Resp: 18    Complications: No apparent anesthesia complications

## 2014-07-10 NOTE — Progress Notes (Signed)
Conversion of uni knee to total knee

## 2014-07-10 NOTE — Brief Op Note (Signed)
07/10/2014  2:00 PM  PATIENT:  [name]  74 y.o. 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  PROCEDURE:  Procedure(s): RIGHT CONVERSION OF PARTIAL KNEE TO TOTAL KNEE (Right)  Right total knee revision Depuy components - see body of dictation for sizing  SURGEON:  Surgeon(s) and Role:    * Paralee Cancel, MD - Primary  PHYSICIAN ASSISTANT: Danae Orleans, PA-C  ANESTHESIA:   spinal  EBL:  Total I/O In: 2000 [I.V.:2000] Out: 300 [Urine:300]  BLOOD ADMINISTERED:none  DRAINS: none   LOCAL MEDICATIONS USED:  MARCAINE     SPECIMEN:  No Specimen  DISPOSITION OF SPECIMEN:  N/A  COUNTS:  NO   TOURNIQUET:   Total Tourniquet Time Documented: Thigh (Right) - 41 minutes Total: Thigh (Right) - 41 minutes   DICTATION: .Other Dictation: Dictation Number [id number]  PLAN OF CARE: Admit to inpatient   PATIENT DISPOSITION:  PACU - hemodynamically stable.   Delay start of Pharmacological VTE agent (>24hrs) due to surgical blood loss or risk of bleeding: no

## 2014-07-10 NOTE — Anesthesia Postprocedure Evaluation (Signed)
  Anesthesia Post-op Note  Patient: [name]  Procedure(s) Performed: Procedure(s): RIGHT CONVERSION OF PARTIAL KNEE TO TOTAL KNEE (Right)  Patient Location: PACU  Anesthesia Type:General  Level of Consciousness: awake, alert , oriented and patient cooperative  Airway and Oxygen Therapy: Patient Spontanous Breathing  Post-op Pain: mild  Post-op Assessment: Post-op Vital signs reviewed, Patient's Cardiovascular Status Stable, Respiratory Function Stable, Patent Airway, No signs of Nausea or vomiting and Pain level controlled  Post-op Vital Signs: stable  Last Vitals:  Filed Vitals:   07/10/14 1530  BP: 126/74  Pulse: 76  Temp: 36.3 C  Resp: 16    Complications: No apparent anesthesia complications

## 2014-07-10 NOTE — Anesthesia Procedure Notes (Signed)
Spinal Patient location during procedure: OR Staffing Anesthesiologist: Crystallee Werden Performed by: anesthesiologist  Preanesthetic Checklist Completed: patient identified, site marked, surgical consent, pre-op evaluation, timeout performed, IV checked, risks and benefits discussed and monitors and equipment checked Spinal Block Patient position: sitting Prep: Betadine Patient monitoring: heart rate, continuous pulse ox and blood pressure Injection technique: single-shot Needle Needle type: Spinocan  Needle gauge: 22 G Needle length: 9 cm Additional Notes Expiration date of kit checked and confirmed. Patient tolerated procedure well, without complications.

## 2014-07-11 ENCOUNTER — Encounter (HOSPITAL_COMMUNITY): Payer: Self-pay | Admitting: Orthopedic Surgery

## 2014-07-11 LAB — CBC
HCT: 29.4 % — ABNORMAL LOW (ref 36.0–46.0)
Hemoglobin: 9.5 g/dL — ABNORMAL LOW (ref 12.0–15.0)
MCH: 29.6 pg (ref 26.0–34.0)
MCHC: 32.3 g/dL (ref 30.0–36.0)
MCV: 91.6 fL (ref 78.0–100.0)
Platelets: 207 10*3/uL (ref 150–400)
RBC: 3.21 MIL/uL — ABNORMAL LOW (ref 3.87–5.11)
RDW: 13.6 % (ref 11.5–15.5)
WBC: 9.6 10*3/uL (ref 4.0–10.5)

## 2014-07-11 LAB — BASIC METABOLIC PANEL
Anion gap: 10 (ref 5–15)
BUN: 20 mg/dL (ref 6–20)
CO2: 25 mmol/L (ref 22–32)
Calcium: 8.2 mg/dL — ABNORMAL LOW (ref 8.9–10.3)
Chloride: 103 mmol/L (ref 101–111)
Creatinine, Ser: 1 mg/dL (ref 0.44–1.00)
GFR calc Af Amer: >60 mL/min (ref >60)
GFR calc non Af Amer: 54 mL/min — ABNORMAL LOW (ref >60)
Glucose, Bld: 150 mg/dL — ABNORMAL HIGH (ref 65–99)
Potassium: 4.4 mmol/L (ref 3.5–5.1)
Sodium: 138 mmol/L (ref 135–145)

## 2014-07-11 NOTE — Progress Notes (Signed)
Physical Therapy Evaluation Patient Details Name: [name] MRN: [id number] DOB: [birth year] Today's Date: 07/11/2014   History of Present Illness  R TKR; s/p Bil UKR  Clinical Impression  Pt s/p R TKR presents with decreased R LE strength/ROM and post op pain limiting functional mobility.  Pt should progress to dc home with assist of family and HHPT follow up.    Follow Up Recommendations Home health PT    Equipment Recommendations  None recommended by PT    Recommendations for Other Services OT consult     Precautions / Restrictions Precautions Precautions: Knee;Fall Restrictions Weight Bearing Restrictions: No Other Position/Activity Restrictions: WBAT      Mobility  Bed Mobility Overal bed mobility: Needs Assistance Bed Mobility: Supine to Sit     Supine to sit: Min assist     General bed mobility comments: cues for sequence and use of L LE to self assist  Transfers Overall transfer level: Needs assistance Equipment used: Rolling walker (2 wheeled)   Sit to Stand: Min assist;From elevated surface         General transfer comment: cues for LE management and use of UEs to self assist  Ambulation/Gait Ambulation/Gait assistance: Min assist;Mod assist Ambulation Distance (Feet): 15 Feet (twice) Assistive device: Rolling walker (2 wheeled) Gait Pattern/deviations: Step-to pattern;Decreased step length - right;Decreased step length - left;Shuffle;Trunk flexed Gait velocity: decr   General Gait Details: cues for sequence, posture and position from RW - pt tolerating min WB on R  Stairs            Wheelchair Mobility    Modified Rankin (Stroke Patients Only)       Balance                                             Pertinent Vitals/Pain Pain Assessment: 0-10 Pain Score: 7  Pain Location: R knee Pain Descriptors / Indicators: Aching;Sore Pain Intervention(s): Limited activity within patient's tolerance;Monitored  during session;Premedicated before session;Ice applied    Home Living Family/patient expects to be discharged to:: Private residence Living Arrangements: Alone Available Help at Discharge: Family Type of Home: House Home Access: Stairs to enter Entrance Stairs-Rails: None Entrance Stairs-Number of Steps: 2 Home Layout: One level Home Equipment: Environmental consultant - 2 wheels;Bedside commode      Prior Function Level of Independence: Independent;Independent with assistive device(s)               Hand Dominance   Dominant Hand: Right    Extremity/Trunk Assessment   Upper Extremity Assessment: Overall WFL for tasks assessed           Lower Extremity Assessment: RLE deficits/detail RLE Deficits / Details: 2/5 quads with AAROM at knee -10 -35    Cervical / Trunk Assessment: Normal  Communication   Communication: No difficulties  Cognition Arousal/Alertness: Awake/alert Behavior During Therapy: WFL for tasks assessed/performed Overall Cognitive Status: Within Functional Limits for tasks assessed                      General Comments      Exercises Total Joint Exercises Ankle Circles/Pumps: AROM;Both;15 reps;Supine Quad Sets: AROM;Both;10 reps;Supine Heel Slides: AAROM;Right;10 reps;Supine Straight Leg Raises: AAROM;Right;10 reps;Supine      Assessment/Plan    PT Assessment Patient needs continued PT services  PT Diagnosis Difficulty walking   PT Problem List Decreased strength;Decreased  range of motion;Decreased activity tolerance;Decreased mobility;Decreased knowledge of use of DME;Obesity;Pain  PT Treatment Interventions DME instruction;Gait training;Stair training;Functional mobility training;Therapeutic activities;Therapeutic exercise;Patient/family education   PT Goals (Current goals can be found in the Care Plan section) Acute Rehab PT Goals Patient Stated Goal: Resume previous lifestyle with decreased pain PT Goal Formulation: With patient Time For  Goal Achievement: 07/18/14 Potential to Achieve Goals: Good    Frequency 7X/week   Barriers to discharge        Co-evaluation               End of Session Equipment Utilized During Treatment: Gait belt Activity Tolerance: Patient limited by pain;Patient limited by fatigue Patient left: in chair;with call bell/phone within reach Nurse Communication: Mobility status         Time: 1749-4496 PT Time Calculation (min) (ACUTE ONLY): 28 min   Charges:   PT Evaluation $Initial PT Evaluation Tier I: 1 Procedure PT Treatments $Therapeutic Exercise: 8-22 mins   PT G Codes:        Keawe Marcello 2014-07-20, 3:31 PM

## 2014-07-11 NOTE — Discharge Instructions (Signed)

## 2014-07-11 NOTE — Progress Notes (Signed)
OT Cancellation Note  Patient Details Name: [name] MRN: [id number] DOB: 1940-12-08   Cancelled Treatment:    Reason Eval/Treat Not Completed: Other (comment).  Pt just got back to bed:  Will check back later today or in the am.  Zymere Patlan 07/11/2014, 2:06 PM  Lesle Chris, OTR/L [phone] 07/11/2014

## 2014-07-11 NOTE — Progress Notes (Signed)
     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 No events throughout the night.   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   Weight as of this encounter: 101.833 kg (224 lb 8 oz). Patient also counseled that weight may inhibit the healing process Patient counseled that losing weight will help with future health issues     [name]. [name]   PAC  07/11/2014, 8:44 AM

## 2014-07-11 NOTE — Progress Notes (Signed)
Physical Therapy Treatment Patient Details Name: [name] MRN: [id number] DOB: 1940/03/27 Today's Date: 07-18-14    History of Present Illness R TKR; s/p Bil UKR    PT Comments    Marked improvement in activity tolerance vs am session.    Follow Up Recommendations  Home health PT     Equipment Recommendations  None recommended by PT    Recommendations for Other Services OT consult     Precautions / Restrictions Precautions Precautions: Knee;Fall Restrictions Weight Bearing Restrictions: No Other Position/Activity Restrictions: WBAT    Mobility  Bed Mobility Overal bed mobility: Needs Assistance Bed Mobility: Supine to Sit;Sit to Supine     Supine to sit: Min assist Sit to supine: Min assist   General bed mobility comments: cues for sequence and use of L LE to self assist  Transfers Overall transfer level: Needs assistance Equipment used: Rolling walker (2 wheeled) Transfers: Sit to/from Stand Sit to Stand: From elevated surface;Min assist;Min guard         General transfer comment: cues for LE management and use of UEs to self assist  Ambulation/Gait Ambulation/Gait assistance: Min assist Ambulation Distance (Feet): 59 Feet (and 15' twice to/from bathroom) Assistive device: Rolling walker (2 wheeled) Gait Pattern/deviations: Step-to pattern;Decreased step length - right;Decreased step length - left;Shuffle;Trunk flexed;Antalgic Gait velocity: decr   General Gait Details: cues for sequence, posture and position from RW - pt tolerating min WB on R   Stairs            Wheelchair Mobility    Modified Rankin (Stroke Patients Only)       Balance                                    Cognition Arousal/Alertness: Awake/alert Behavior During Therapy: WFL for tasks assessed/performed Overall Cognitive Status: Within Functional Limits for tasks assessed                      Exercises      General Comments         Pertinent Vitals/Pain Pain Assessment: 0-10 Pain Score: 6  Pain Location: R knee Pain Descriptors / Indicators: Aching;Sore Pain Intervention(s): Limited activity within patient's tolerance;Monitored during session;Premedicated before session;Ice applied    Home Living                      Prior Function            PT Goals (current goals can now be found in the care plan section) Acute Rehab PT Goals Patient Stated Goal: Resume previous lifestyle with decreased pain PT Goal Formulation: With patient Time For Goal Achievement: 07/18/14 Potential to Achieve Goals: Good Progress towards PT goals: Progressing toward goals    Frequency  7X/week    PT Plan Current plan remains appropriate    Co-evaluation             End of Session Equipment Utilized During Treatment: Gait belt;Right knee immobilizer Activity Tolerance: Patient tolerated treatment well Patient left: in bed;with call bell/phone within reach     Time: 1455-1518 PT Time Calculation (min) (ACUTE ONLY): 23 min  Charges:  $Gait Training: 23-37 mins                    G Codes:      Lennette Fader 07/18/14, 4:21 PM

## 2014-07-11 NOTE — Progress Notes (Signed)
Orthopedic Tech Progress Note Patient Details:  [name] [birth year] [id number]  Ortho Devices Type of Ortho Device: Knee Immobilizer Ortho Device/Splint Interventions: Application   Cammer, Theodoro Parma 07/11/2014, 11:21 AM

## 2014-07-11 NOTE — Care Management Note (Signed)
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)  Action/Plan: Discharge planning  Expected Discharge Date:  07/11/14         Expected Discharge Plan:  [place]  In-House Referral:     Discharge planning Services  CM Consult  Post Acute Care Choice:  Home Health Choice offered to:  Patient  DME Arranged:    DME Agency:     HH Arranged:  PT HH Agency:  [place]  Status of Service:  Completed, signed off  Medicare Important Message Given:    Date Medicare IM Given:    Medicare IM give by:    Date Additional Medicare IM Given:    Additional Medicare Important Message give by:     If discussed at [place] of Stay Meetings, dates discussed:    Additional Comments: CM met with pt in room to offer choice of home health agency.  Pt chooses [facility] to render HHPT.  Address and contact information verified by pt.  Referral called to [facility].  Pt has both rolling walker and 3n1 at home.  No other CM needs were communicated. [name], RN 07/11/2014, 3:25 PM

## 2014-07-11 NOTE — Op Note (Signed)
[name], [name]             ACCOUNT NO.:  [id number]  MEDICAL RECORD NO.:  [id number]  LOCATION:  29                         FACILITY:  [facility]  PHYSICIAN:  Pietro Cassis. Alvan Dame, M.D.  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  PROCEDURE:  Revision of right total knee arthroplasty converting to right partial knee arthroplasty to a total knee arthroplasty.  COMPONENTS USED:  DePuy knee system with a size 3 femur, size 3 MBT revision tray with 20 cemented sleeve and 5-mm medial and lateral augments and size 12.5 posterior stabilized polyethylene insert with a 41 patellar button.  SURGEON:  Pietro Cassis. Alvan Dame, M.D.  ASSISTANT:  Danae Orleans, PA-C.  Note that, Mr. [name] was present for the entirety of the case from preoperative positioning, perioperative management of the operative extremity, general facilitation of the case and primary wound closure.  ANESTHESIA:  Spinal.  SPECIMENS:  None.  COMPLICATIONS:  None.  TOURNIQUET TIME:  41 minutes at 250 mmHg.  DRAINS:  None.  INDICATIONS FOR PROCEDURE:  [name] is a pleasant 74 year old female with history of bilateral partial knee arthroplasties approximately 7 years ago.  She has had recent onset of problems involving her right knee.  Workup consisted previous MRI of the right knee and arthroscopic surgery to evaluate for mass, lateral meniscal tearing, but unfortunately, she has progressive and worsening symptoms. She had failed cortisone injections and nonoperative management in terms of strengthening and at this  point, is ready to proceed with definitive measures, risks of persistent problems with pain, postoperative recovery reviewed and discussed, standard risk of infection, DVT discussed and reviewed.  Consent was obtained for benefit of pain relief.  PROCEDURE IN DETAIL:  The patient was brought to the operative theater. Once adequate anesthesia, preoperative antibiotics, Ancef administered, she was positioned supine with the right thigh tourniquet placed.  The right lower extremity was then prepped and draped in sterile fashion using the [facility] leg holder.  A time-out was performed identifying the patient, planned procedure, and extremity.  Leg was exsanguinated and tourniquet was elevated to 250 mmHg.  Midline incision was made including excising her old incision.  Soft tissue planes created, median arthrotomy was then made encountering clear yellow synovial fluid, no signs of infection.  Initial exposure was carried out with proximal medial peel, synovectomy medial and laterally and exposed the suprapatellar region.  With the knee exposed, attention was first directed to the patella.  The patella was slightly everted and further debridement of the synovium and fat around the patella carried out.  I then measured the patella to be approximately 24 mm, we resected down to 15 mm and used a 41 patellar button to restore height and alignment.  The lug holes were drilled and a metal Shim was placed to protect the patella from retractors and saw blades throughout the case.  At this  point, the knee was flexed and after debridement, we entered the distal femoral canal with a drill, irrigated to try to prevent fat emboli.  An intramedullary rod was passed at 3 degrees of valgus, I resected the 10 mm of bone off the distal femur.  This was utilized at the maintained joint line.  The distal femoral component was placed with the femoral component placed.  The distal femoral cut was then made. The  femoral component was removed using an osteotome and the final cuts after this were made noting no significant defects in the bone.  At this point, the attention was directed to the tibia using the extramedullary guide set at 8 mm of resection using the stylus off the proximal lateral tibia allowing for placement of the MBT revision tray. I initiated my office proximal tibial cut.  We then removed the tibial tray using osteotomes without bone loss.  The final proximal tibia cut was made.  At this point, I removed remaining cement, noted just small contained defects in the proximal medial tibia, but nothing that I felt required augmentation and definitely not required removal of any further bone.  Once the proximal tibia was cut, we confirmed that the cut was perpendicular to coronal plane and then also checked that the extension gap was going to be stable with at least a 10-mm insert and it was and may have been little bit loose even then.  At this point, we turned back to the femur.  The femur was sized to be size 3.  The size 3 rotation block was then pinned in position using the proximal tibia cut to set rotation.  The final 4-in-1 cutting block was pinned into position.  Anterior, posterior, and chamfer cuts were made without difficulty.  Final box cut was made off the lateral aspect of distal femur.  At this point, the tibia subluxated anteriorly.  This cut surface seemed to be best fit with size 3, the size 3 tray was pinned into position, drilled and keel punched for MBT revision tray.  I then broached for the 29 cemented broach, provided extra support and rotation.  The trial size 3 MBT tray with 29 sleeve was then placed and the size 3 femur placed. At this point, I trialed and with a 15-mm insert, I felt the knee came to extension, maybe a little bit of hyper.  I thus made a determination to not have excessive polyethylene in this case to augment the proximal tibia.  At  this point, all trial components were removed, the synovial capsule junction of the knee was injected with 0.25% Marcaine with epinephrine, 1 mL of Toradol and 30 mL of saline.  Final components were opened and configured on the back table under my direct supervision.  The knee was irrigated with normal saline solution while this was going on.  Once the cement was mixed, the components were cemented into place. Please note that I did place a proximal tibia cement restrictor, size 4.  Following preparation of the bone, the final components were cemented into place.  The knee was brought to extension with a 12.5 insert and allowed the cement to cure.  Once the cement cured, we confirmed with a size 12.5 insert to allow for full knee extension and good flexion with stable ligaments from extension to flexion.  Given these findings, component was removed.  Then, the final 12.5 posterior stabilized insert to match the 3 femur was inserted. Excessive cement had been removed from  the knee.  The knee was irrigated throughout the case again at this point.  At this point, with the knee in flexion, the extensor mechanism was reapproximated using a #1 Vicryl and 0 V-Loc suture.  The remainder of the wound was closed with 2-0 Vicryl and subcuticular stitch.  The knee was then cleaned, dried and dressed sterilely using surgical glue and Aquacel dressing.  Knee was wrapped in an Ace wrap.  She was brought to the recovery room in stable condition tolerating the procedure well. Findings were reviewed with family.     Pietro Cassis Alvan Dame, M.D.     MDO/MEDQ  D:  07/10/2014  T:  07/11/2014  Job:  [id number]

## 2014-07-12 LAB — BASIC METABOLIC PANEL
Anion gap: 8 (ref 5–15)
BUN: 19 mg/dL (ref 6–20)
CO2: 25 mmol/L (ref 22–32)
Calcium: 7.8 mg/dL — ABNORMAL LOW (ref 8.9–10.3)
Chloride: 103 mmol/L (ref 101–111)
Creatinine, Ser: 0.95 mg/dL (ref 0.44–1.00)
GFR calc Af Amer: >60 mL/min (ref >60)
GFR calc non Af Amer: 58 mL/min — ABNORMAL LOW (ref >60)
Glucose, Bld: 136 mg/dL — ABNORMAL HIGH (ref 65–99)
Potassium: 4.1 mmol/L (ref 3.5–5.1)
Sodium: 136 mmol/L (ref 135–145)

## 2014-07-12 LAB — CBC
HCT: 27.8 % — ABNORMAL LOW (ref 36.0–46.0)
Hemoglobin: 9 g/dL — ABNORMAL LOW (ref 12.0–15.0)
MCH: 30.3 pg (ref 26.0–34.0)
MCHC: 32.4 g/dL (ref 30.0–36.0)
MCV: 93.6 fL (ref 78.0–100.0)
Platelets: 225 10*3/uL (ref 150–400)
RBC: 2.97 MIL/uL — ABNORMAL LOW (ref 3.87–5.11)
RDW: 14.1 % (ref 11.5–15.5)
WBC: 10.3 10*3/uL (ref 4.0–10.5)

## 2014-07-12 MED ORDER — POLYETHYLENE GLYCOL 3350 17 G PO PACK: 17.0000 g | PACK | Freq: Two times a day (BID) | ORAL | Status: DC

## 2014-07-12 MED ORDER — HYDROCODONE-ACETAMINOPHEN 7.5-325 MG PO TABS: 1.0000 | ORAL_TABLET | ORAL | Status: DC | PRN

## 2014-07-12 MED ORDER — DOCUSATE SODIUM 100 MG PO CAPS: 100.0000 mg | ORAL_CAPSULE | Freq: Two times a day (BID) | ORAL | Status: DC

## 2014-07-12 MED ORDER — ASPIRIN 325 MG PO TBEC: 325.0000 mg | DELAYED_RELEASE_TABLET | Freq: Two times a day (BID) | ORAL | Status: AC

## 2014-07-12 MED ORDER — FERROUS SULFATE 325 (65 FE) MG PO TABS: 325.0000 mg | ORAL_TABLET | Freq: Three times a day (TID) | ORAL | Status: DC

## 2014-07-12 MED ORDER — TIZANIDINE HCL 4 MG PO TABS: 4.0000 mg | ORAL_TABLET | Freq: Four times a day (QID) | ORAL | Status: DC | PRN

## 2014-07-12 NOTE — Progress Notes (Addendum)
Physical Therapy Treatment Patient Details Name: [name] MRN: [id number] DOB: [birth year] Today's Date: 07/12/2014    History of Present Illness R TKR; s/p Bil UKR    PT Comments    POD # 2 am session.  Applied KI and educated on use for amb and stairs.  Assisted to BR.  Assisted with amb in hallway.  Practiced going up 2 steps backward due to no rails.  Returned to room to perform TKR TE's following HEP handout.  Finished by applying ICE and instructed pt on use. Will return later to perform stairs for Granddaughter who is coming at 1pm to take pt home.   Follow Up Recommendations  Home health PT     Equipment Recommendations  None recommended by PT    Recommendations for Other Services       Precautions / Restrictions Precautions Precaution Comments: instructed pt on KI use for amb/stairs Restrictions Weight Bearing Restrictions: No Other Position/Activity Restrictions: WBAT    Mobility  Bed Mobility               General bed mobility comments: Pt OOB in recliner  Transfers Overall transfer level: Needs assistance Equipment used: Rolling walker (2 wheeled) Transfers: Sit to/from Stand Sit to Stand: Supervision;Min guard         General transfer comment: one VC to advance R LE prior to sit  Ambulation/Gait Ambulation/Gait assistance: Supervision;Min guard Ambulation Distance (Feet): 85 Feet Assistive device: Rolling walker (2 wheeled) Gait Pattern/deviations: Step-to pattern;Decreased stance time - right;Trunk flexed Gait velocity: VC's to decrease gait speed to increase safety   General Gait Details: <25% VC's on proper walker to self distance and safety with turns   Stairs Stairs: Yes Stairs assistance: Min assist Stair Management: No rails;Step to pattern;Backwards;With walker Number of Stairs: 2 General stair comments: 25% VC's on proper tech and walker placement plus handout given  Wheelchair Mobility    Modified Rankin (Stroke  Patients Only)       Balance                                    Cognition Arousal/Alertness: Awake/alert Behavior During Therapy: WFL for tasks assessed/performed Overall Cognitive Status: Within Functional Limits for tasks assessed                      Exercises   Total Knee Replacement TE's 10 reps B LE ankle pumps 10 reps towel squeezes 10 reps knee presses 10 reps heel slides  10 reps SAQ's 10 reps SLR's 10 reps ABD Followed by ICE     General Comments        Pertinent Vitals/Pain Pain Assessment: 0-10 Pain Score: 4  Pain Location: R knee Pain Descriptors / Indicators: Aching;Tightness Pain Intervention(s): Monitored during session;Premedicated before session;Repositioned;Ice applied    Home Living                      Prior Function            PT Goals (current goals can now be found in the care plan section) Progress towards PT goals: Progressing toward goals    Frequency  7X/week    PT Plan      Co-evaluation             End of Session Equipment Utilized During Treatment: Gait belt;Right knee immobilizer Activity Tolerance: Patient tolerated treatment well  Patient left: in chair;with call bell/phone within reach     Time: 1010-1050 PT Time Calculation (min) (ACUTE ONLY): 40 min  Charges:  $Gait Training: 8-22 mins $Therapeutic Exercise: 8-22 mins $Therapeutic Activity: 8-22 mins                    G Codes:      Rica Koyanagi  PTA [facility]  Acute  Rehab Pager      [phone]

## 2014-07-12 NOTE — Plan of Care (Signed)
Problem: Consults Goal: Diagnosis- Total Joint Replacement Outcome: Completed/Met Date Met:  07/12/14 Primary Total Knee RIGHT  Problem: Phase III Progression Outcomes Goal: Anticoagulant follow-up in place Outcome: Not Applicable Date Met:  29/56/21 ASA for VTE, no f/u needed.

## 2014-07-12 NOTE — Progress Notes (Signed)
Physical Therapy Treatment Patient Details Name: [name] MRN: [id number] DOB: February 01, 1941 Today's Date: 07/12/2014    History of Present Illness R TKR; s/p Bil UKR    PT Comments    POD # 2 pm session.  Granddaughter and Yolanda Bonine present for education.  Assisted pt up 2 steps backward with RW.  Instructed on safe handling during transfers and gait. Demonstrated and instructed on proper car transfer getting into high SUV.   Follow Up Recommendations  Home health PT     Equipment Recommendations  None recommended by PT    Recommendations for Other Services       Precautions / Restrictions Precautions Precaution Comments: instructed pt on KI use for amb/stairs Restrictions Weight Bearing Restrictions: No Other Position/Activity Restrictions: WBAT    Mobility  Bed Mobility               General bed mobility comments: Pt OOB in recliner  Transfers Overall transfer level: Needs assistance Equipment used: Rolling walker (2 wheeled) Transfers: Sit to/from Stand Sit to Stand: Supervision;Min guard         General transfer comment: one VC to advance R LE prior to sit.  Also, assisted with car transfer with Granddaughter and Grandson.  Instructed on proper tech and safe handling getting pt into high SUV.  Ambulation/Gait Ambulation/Gait assistance: Supervision;Min guard Ambulation Distance (Feet): 15 Feet Assistive device: Rolling walker (2 wheeled) Gait Pattern/deviations: Step-to pattern;Decreased stance time - right;Trunk flexed Gait velocity: VC's to decrease gait speed to increase safety   General Gait Details: <25% VC's on proper walker to self distance and safety with turns   Stairs Stairs: Yes Stairs assistance: Min assist Stair Management: No rails;Step to pattern;Backwards;With walker Number of Stairs: 2 General stair comments: 25% VC's on proper tech and walker placement plus handout given.  Granddaughter and Yolanda Bonine present for education on  proper tech and safe handling.  Wheelchair Mobility    Modified Rankin (Stroke Patients Only)       Balance                                    Cognition Arousal/Alertness: Awake/alert Behavior During Therapy: WFL for tasks assessed/performed Overall Cognitive Status: Within Functional Limits for tasks assessed                      Exercises      General Comments        Pertinent Vitals/Pain Pain Assessment: 0-10 Pain Score: 4  Pain Location: R knee Pain Descriptors / Indicators: Aching;Tightness Pain Intervention(s): Monitored during session;Premedicated before session;Repositioned;Ice applied    Home Living                      Prior Function            PT Goals (current goals can now be found in the care plan section) Progress towards PT goals: Progressing toward goals    Frequency  7X/week    PT Plan      Co-evaluation             End of Session Equipment Utilized During Treatment: Gait belt;Right knee immobilizer Activity Tolerance: Patient tolerated treatment well Patient left: in chair;with call bell/phone within reach     Time: 1320-1350 PT Time Calculation (min) (ACUTE ONLY): 30 min  Charges:  $Gait Training: 8-22 mins $Therapeutic Activity: 8-22 mins  G Codes:      Rica Koyanagi  PTA [facility]  Acute  Rehab Pager      [phone]

## 2014-07-12 NOTE — Evaluation (Signed)
Occupational Therapy Evaluation Patient Details Name: MAKAYLEIGH POLIQUIN MRN: [id number] DOB: Sep 22, 1940 Today's Date: 07/12/2014    History of Present Illness R TKR; s/p Bil UKR   Clinical Impression   This 74 year old female was admitted for the above surgery.  All education was completed. No further OT is needed at this time.    Follow Up Recommendations  No OT follow up    Equipment Recommendations  None recommended by OT    Recommendations for Other Services       Precautions / Restrictions Precautions Precautions: Knee;Fall Restrictions Weight Bearing Restrictions: No Other Position/Activity Restrictions: WBAT      Mobility Bed Mobility         Supine to sit: Min assist Sit to supine: Min assist   General bed mobility comments: assist for LLE.  Pt has used a belt to self manage after past sxs  Transfers   Equipment used: Rolling walker (2 wheeled) Transfers: Sit to/from Stand Sit to Stand: Min guard         General transfer comment: cues for UE placement initially    Balance                                            ADL Overall ADL's : Needs assistance/impaired     Grooming: Brushing hair;Supervision/safety;Standing                   Toilet Transfer: Min guard;Ambulation;BSC   Toileting- Water quality scientist and Hygiene: Supervision/safety;Sit to/from stand   Tub/ Shower Transfer: Min guard;Ambulation;Walk-in shower     General ADL Comments: pt is able to perform UB adls with set up and sister will assist with LB adls.  Reviewed bathroom transfers, pt washed peri area and brushed hair.  Verbalizes understanding of all education     Vision     Perception     Praxis      Pertinent Vitals/Pain Pain Score: 5  Pain Location: R knee Pain Descriptors / Indicators: Aching Pain Intervention(s): Limited activity within patient's tolerance;Monitored during session;Premedicated before session;Repositioned;Ice  applied     Hand Dominance Right   Extremity/Trunk Assessment Upper Extremity Assessment Upper Extremity Assessment: Overall WFL for tasks assessed           Communication Communication Communication: No difficulties   Cognition Arousal/Alertness: Awake/alert Behavior During Therapy: WFL for tasks assessed/performed Overall Cognitive Status: Within Functional Limits for tasks assessed                     General Comments       Exercises       Shoulder Instructions      Home Living Family/patient expects to be discharged to:: Private residence Living Arrangements: Alone Available Help at Discharge: Family               Bathroom Shower/Tub: Walk-in Corporate treasurer Toilet: Standard     Home Equipment: Bedside commode;Shower seat   Additional Comments: sister will stay 24/7      Prior Functioning/Environment Level of Independence: Independent;Independent with assistive device(s)             OT Diagnosis: Generalized weakness   OT Problem List:     OT Treatment/Interventions:      OT Goals(Current goals can be found in the care plan section) Acute Rehab OT Goals Patient Stated Goal:  get back to being independent  OT Frequency:     Barriers to D/C:            Co-evaluation              End of Session    Activity Tolerance: Patient tolerated treatment well Patient left: in bed;with call bell/phone within reach   Time: 0730-0745 OT Time Calculation (min): 15 min Charges:  OT General Charges $OT Visit: 1 Procedure OT Evaluation $Initial OT Evaluation Tier I: 1 Procedure G-Codes:    Kage Willmann August 04, 2014, 7:53 AM Lesle Chris, OTR/L [phone] August 04, 2014

## 2014-07-12 NOTE — Progress Notes (Signed)
     Subjective: 2 Days Post-Op Procedure(s) (LRB): RIGHT CONVERSION OF PARTIAL KNEE TO TOTAL KNEE (Right)   Patient reports pain as mild, pain controlled. No events throughout the night. Ready to be discharged home.  Objective:   VITALS:   Filed Vitals:   07/12/14 0456  BP: 119/85  Pulse: 74  Temp: 98.6 F (37 C)  Resp: 16    Dorsiflexion/Plantar flexion intact Incision: dressing C/D/I No cellulitis present Compartment soft  LABS  Recent Labs  07/11/14 0431 07/12/14 0405  HGB 9.5* 9.0*  HCT 29.4* 27.8*  WBC 9.6 10.3  PLT 207 225     Recent Labs  07/11/14 0431 07/12/14 0405  NA 138 136  K 4.4 4.1  BUN 20 19  CREATININE 1.00 0.95  GLUCOSE 150* 136*     Assessment/Plan: 2 Days Post-Op Procedure(s) (LRB): RIGHT CONVERSION OF PARTIAL KNEE TO TOTAL KNEE (Right) Up with therapy Discharge home with home health  Follow up in 2 weeks at [facility]. Follow up with OLIN,Erisa Mehlman D in 2 weeks.  Contact information:  [facility] [address], [place] [phone]    Obese (BMI 30-39.9) Estimated body mass index is 36.25 kg/(m^2) as calculated from the following:   Height as of this encounter: 5\' 6"  (1.676 m).   Weight as of this encounter: 101.833 kg (224 lb 8 oz). Patient also counseled that weight may inhibit the healing process Patient counseled that losing weight will help with future health issues        West Pugh. Ricard Faulkner   PAC  07/12/2014, 8:37 AM

## 2014-07-16 NOTE — Discharge Summary (Signed)
Physician Discharge Summary  Patient ID: [name] MRN: [id number] DOB/AGE: 1941-01-23 74 y.o.  Admit date: 07/10/2014 Discharge date: 07/12/2014   Procedures:  Procedure(s) (LRB): RIGHT CONVERSION OF PARTIAL KNEE TO TOTAL KNEE (Right)  Attending Physician:  Dr. Paralee Cancel   Admission Diagnoses:   Right knee pain s/p UKR  Discharge Diagnoses:  Principal Problem:   S/P right TKA Active Problems:   S/P knee replacement  Past Medical History  Diagnosis Date  . Hypertension   . Asthma   . COPD (chronic obstructive pulmonary disease)   . Hypothyroidism   . GERD (gastroesophageal reflux disease)   . MCNS (minimal change nephrotic syndrome)   . Actinic keratosis   . Osteopenia   . Squamous cell carcinoma   . Basal cell carcinoma   . Hyperlipidemia   . Heart murmur     hx of in childhood   . Shortness of breath dyspnea     on exertion   . Arthritis     HPI:    [name], 74 y.o. female, has a history of pain and functional disability in the right knee(s) due to failed previous arthroplasty and patient has failed non-surgical conservative treatments for greater than 12 weeks to include NSAID's and/or analgesics, supervised PT with diminished ADL's post treatment, use of assistive devices and activity modification. The indications for the revision of the total knee arthroplasty are bearing surface wear leading to symptomatic synovitis and pain. Onset of symptoms was gradual starting ~7 years ago with gradually worsening course since that time. Prior procedures on the right knee(s) include unicompartmental arthroplasty per Dr. Alvan Dame in 2007. Patient currently rates pain in the right knee(s) at 7 out of 10 with activity. There is night pain, worsening of pain with activity and weight bearing, pain that interferes with activities of daily living, pain with passive range of motion, crepitus and joint swelling. Patient has evidence of periarticular osteophytes, joint  space narrowing and failed previous UKR by imaging studies. This condition presents safety issues increasing the risk of falls. There is no current active infection. Risks, benefits and expectations were discussed with the patient. Risks including but not limited to the risk of anesthesia, blood clots, nerve damage, blood vessel damage, failure of the prosthesis, infection and up to and including death. Patient understand the risks, benefits and expectations and wishes to proceed with surgery.   PCP: Ival Bible, MD   Discharged Condition: good  Hospital Course:  Patient underwent the above stated procedure on 07/10/2014. Patient tolerated the procedure well and brought to the recovery room in good condition and subsequently to the floor.  POD #1 BP: 131/67 ; Pulse: 68 ; Temp: 98.2 F (36.8 C) ; Resp: 16 Patient reports pain as moderate, pain controlled with medication. No events throughout the night. Dorsiflexion/plantar flexion intact, incision: dressing C/D/I, no cellulitis present and compartment soft.   LABS  Basename    HGB  9.5  HCT  29.4   POD #2  BP: 119/85 ; Pulse: 74 ; Temp: 98.6 F (37 C) ; Resp: 16 Patient reports pain as mild, pain controlled. No events throughout the night. Ready to be discharged home. Dorsiflexion/plantar flexion intact, incision: dressing C/D/I, no cellulitis present and compartment soft.   LABS  Basename    HGB  9.0  HCT  27.8    Discharge Exam: General appearance: alert, cooperative and no distress Extremities: Homans sign is negative, no sign of DVT, no edema, redness or tenderness in  the calves or thighs and no ulcers, gangrene or trophic changes  Disposition: Home with follow up in 2 weeks   Follow-up Information    Follow up with Mauri Pole, MD. Schedule an appointment as soon as possible for a visit in 2 weeks.   Specialty:  Orthopedic Surgery   Contact information:   [address] [place]  [address] [phone]       Follow up with [facility].   Why:  home health physical therapy   Contact information:   [place] [address] [phone]       Discharge Instructions    Call MD / Call 911    Complete by:  As directed   If you experience chest pain or shortness of breath, CALL 911 and be transported to the hospital emergency room.  If you develope a fever above 101 F, pus (white drainage) or increased drainage or redness at the wound, or calf pain, call your surgeon's office.     Change dressing    Complete by:  As directed   Maintain surgical dressing until follow up in the clinic. If the edges start to pull up, may reinforce with tape. If the dressing is no longer working, may remove and cover with gauze and tape, but must keep the area dry and clean.  Call with any questions or concerns.     Constipation Prevention    Complete by:  As directed   Drink plenty of fluids.  Prune juice may be helpful.  You may use a stool softener, such as Colace (over the counter) 100 mg twice a day.  Use MiraLax (over the counter) for constipation as needed.     Diet - low sodium heart healthy    Complete by:  As directed      Discharge instructions    Complete by:  As directed   Maintain surgical dressing until follow up in the clinic. If the edges start to pull up, may reinforce with tape. If the dressing is no longer working, may remove and cover with gauze and tape, but must keep the area dry and clean.  Follow up in 2 weeks at [facility]. Call with any questions or concerns.     Increase activity slowly as tolerated    Complete by:  As directed      TED hose    Complete by:  As directed   Use stockings (TED hose) for 2 weeks on both leg(s).  You may remove them at night for sleeping.     Weight bearing as tolerated    Complete by:  As directed   Laterality:  right  Extremity:  Lower             Medication List    STOP taking  these medications        traMADol 50 MG tablet  Commonly known as:  ULTRAM     Umeclidinium-Vilanterol 62.5-25 MCG/INH Aepb      TAKE these medications        albuterol 108 (90 BASE) MCG/ACT inhaler  Commonly known as:  PROAIR HFA  Inhale 2 puffs into the lungs every 6 (six) hours as needed for wheezing or shortness of breath.     aspirin 325 MG EC tablet  Take 1 tablet (325 mg total) by mouth 2 (two) times daily.  Notes to Patient:  Blood thinner     budesonide-formoterol 160-4.5 MCG/ACT inhaler  Commonly known  as:  SYMBICORT  Inhale 2 puffs into the lungs 2 times daily at 12 noon and 4 pm.     Calcium-Vitamin D 600-400 MG-UNIT Tabs  Take 1 tablet by mouth 2 (two) times daily.     citalopram 20 MG tablet  Commonly known as:  CELEXA  Take 1 tablet (20 mg total) by mouth at bedtime.     docusate sodium 100 MG capsule  Commonly known as:  COLACE  Take 1 capsule (100 mg total) by mouth 2 (two) times daily.     ferrous sulfate 325 (65 FE) MG tablet  Take 1 tablet (325 mg total) by mouth 3 (three) times daily after meals.     fluticasone 50 MCG/ACT nasal spray  Commonly known as:  FLONASE  Place 1-2 sprays into both nostrils daily as needed for allergies or rhinitis.     HYDROcodone-acetaminophen 7.5-325 MG per tablet  Commonly known as:  NORCO  Take 1-2 tablets by mouth every 4 (four) hours as needed for moderate pain.     levothyroxine 88 MCG tablet  Commonly known as:  SYNTHROID, LEVOTHROID  Take 1 tablet (88 mcg total) by mouth daily before breakfast.     montelukast 10 MG tablet  Commonly known as:  SINGULAIR  Take 1 tablet (10 mg total) by mouth at bedtime.     multivitamin with minerals Tabs tablet  Take 1 tablet by mouth 2 (two) times daily.     OMEGA 3 PO  Take 1 tablet by mouth 2 (two) times daily.     pantoprazole 40 MG tablet  Commonly known as:  PROTONIX  Take 1 tablet (40 mg total) by mouth daily.     polyethylene glycol packet  Commonly known  as:  MIRALAX / GLYCOLAX  Take 17 g by mouth 2 (two) times daily.     polyvinyl alcohol 1.4 % ophthalmic solution  Commonly known as:  LIQUIFILM TEARS  Place 1-2 drops into both eyes daily as needed for dry eyes.     risedronate 150 MG tablet  Commonly known as:  ACTONEL  Take 1 tablet (150 mg total) by mouth every 30 (thirty) days. with water on empty stomach, nothing by mouth or lie down for next 30 minutes.     rosuvastatin 40 MG tablet  Commonly known as:  CRESTOR  Take 1 tablet (40 mg total) by mouth daily.     Tiotropium Bromide Monohydrate 2.5 MCG/ACT Aers  Commonly known as:  SPIRIVA RESPIMAT  Inhale 2 puffs into the lungs daily.     tiZANidine 4 MG tablet  Commonly known as:  ZANAFLEX  Take 1 tablet (4 mg total) by mouth every 6 (six) hours as needed for muscle spasms.     valsartan-hydrochlorothiazide 320-25 MG per tablet  Commonly known as:  DIOVAN-HCT  Take 1 tablet by mouth every morning.         Signed: West Pugh. Brylee Mcgreal   PA-C  07/16/2014, 9:20 AM

## 2014-07-24 NOTE — Addendum Note (Signed)
Addendum  created 07/24/14 1559 by Lauretta Grill, MD   Modules edited: Anesthesia Responsible Staff

## 2015-05-31 ENCOUNTER — Other Ambulatory Visit: Payer: Self-pay

## 2015-05-31 DIAGNOSIS — Z1231 Encounter for screening mammogram for malignant neoplasm of breast: Secondary | ICD-10-CM

## 2015-06-20 ENCOUNTER — Ambulatory Visit
Admission: RE | Admit: 2015-06-20 | Discharge: 2015-06-20 | Disposition: A | Discharge status: Home / Self Care | Payer: Medicare Other | Source: Ambulatory Visit

## 2015-06-20 DIAGNOSIS — Z1231 Encounter for screening mammogram for malignant neoplasm of breast: Secondary | ICD-10-CM

## 2016-05-19 ENCOUNTER — Other Ambulatory Visit: Payer: Self-pay | Admitting: Family Medicine

## 2016-05-19 DIAGNOSIS — Z1231 Encounter for screening mammogram for malignant neoplasm of breast: Secondary | ICD-10-CM

## 2016-06-22 ENCOUNTER — Ambulatory Visit: Payer: Self-pay

## 2016-07-01 ENCOUNTER — Ambulatory Visit: Payer: Self-pay

## 2016-07-14 ENCOUNTER — Ambulatory Visit: Payer: Self-pay

## 2016-07-17 ENCOUNTER — Ambulatory Visit
Admission: RE | Admit: 2016-07-17 | Discharge: 2016-07-17 | Disposition: A | Discharge status: Home / Self Care | Payer: Medicare Other | Source: Ambulatory Visit | Attending: Family Medicine | Admitting: Family Medicine

## 2016-07-17 DIAGNOSIS — Z1231 Encounter for screening mammogram for malignant neoplasm of breast: Secondary | ICD-10-CM

## 2017-02-09 DIAGNOSIS — S3992XA Unspecified injury of lower back, initial encounter: Secondary | ICD-10-CM

## 2017-02-09 HISTORY — DX: Unspecified injury of lower back, initial encounter: S39.92XA

## 2017-05-08 ENCOUNTER — Emergency Department (HOSPITAL_COMMUNITY): Payer: Medicare Other

## 2017-05-08 ENCOUNTER — Other Ambulatory Visit: Payer: Self-pay

## 2017-05-08 ENCOUNTER — Observation Stay (HOSPITAL_COMMUNITY)
Admission: EM | Admit: 2017-05-08 | Discharge: 2017-05-12 | Disposition: A | Discharge status: Skilled Nursing Facility | Payer: Medicare Other | Attending: Family Medicine | Admitting: Family Medicine

## 2017-05-08 ENCOUNTER — Encounter (HOSPITAL_COMMUNITY): Payer: Self-pay

## 2017-05-08 DIAGNOSIS — Y92000 Kitchen of unspecified non-institutional (private) residence as  the place of occurrence of the external cause: Secondary | ICD-10-CM | POA: Insufficient documentation

## 2017-05-08 DIAGNOSIS — N183 Chronic kidney disease, stage 3 (moderate): Secondary | ICD-10-CM | POA: Diagnosis not present

## 2017-05-08 DIAGNOSIS — I129 Hypertensive chronic kidney disease with stage 1 through stage 4 chronic kidney disease, or unspecified chronic kidney disease: Secondary | ICD-10-CM | POA: Diagnosis not present

## 2017-05-08 DIAGNOSIS — I700 Atherosclerosis of aorta: Secondary | ICD-10-CM | POA: Insufficient documentation

## 2017-05-08 DIAGNOSIS — Z79899 Other long term (current) drug therapy: Secondary | ICD-10-CM | POA: Insufficient documentation

## 2017-05-08 DIAGNOSIS — M8580 Other specified disorders of bone density and structure, unspecified site: Secondary | ICD-10-CM | POA: Insufficient documentation

## 2017-05-08 DIAGNOSIS — G44311 Acute post-traumatic headache, intractable: Secondary | ICD-10-CM

## 2017-05-08 DIAGNOSIS — S32000A Wedge compression fracture of unspecified lumbar vertebra, initial encounter for closed fracture: Secondary | ICD-10-CM | POA: Diagnosis present

## 2017-05-08 DIAGNOSIS — Z87891 Personal history of nicotine dependence: Secondary | ICD-10-CM | POA: Insufficient documentation

## 2017-05-08 DIAGNOSIS — S32029A Unspecified fracture of second lumbar vertebra, initial encounter for closed fracture: Secondary | ICD-10-CM | POA: Diagnosis not present

## 2017-05-08 DIAGNOSIS — K219 Gastro-esophageal reflux disease without esophagitis: Secondary | ICD-10-CM | POA: Insufficient documentation

## 2017-05-08 DIAGNOSIS — I10 Essential (primary) hypertension: Secondary | ICD-10-CM | POA: Diagnosis present

## 2017-05-08 DIAGNOSIS — Z6831 Body mass index (BMI) 31.0-31.9, adult: Secondary | ICD-10-CM | POA: Insufficient documentation

## 2017-05-08 DIAGNOSIS — Z96653 Presence of artificial knee joint, bilateral: Secondary | ICD-10-CM | POA: Diagnosis not present

## 2017-05-08 DIAGNOSIS — J449 Chronic obstructive pulmonary disease, unspecified: Secondary | ICD-10-CM | POA: Insufficient documentation

## 2017-05-08 DIAGNOSIS — E785 Hyperlipidemia, unspecified: Secondary | ICD-10-CM | POA: Insufficient documentation

## 2017-05-08 DIAGNOSIS — F329 Major depressive disorder, single episode, unspecified: Secondary | ICD-10-CM | POA: Insufficient documentation

## 2017-05-08 DIAGNOSIS — F419 Anxiety disorder, unspecified: Secondary | ICD-10-CM | POA: Insufficient documentation

## 2017-05-08 DIAGNOSIS — W07XXXA Fall from chair, initial encounter: Secondary | ICD-10-CM | POA: Diagnosis present

## 2017-05-08 DIAGNOSIS — E039 Hypothyroidism, unspecified: Secondary | ICD-10-CM | POA: Diagnosis present

## 2017-05-08 DIAGNOSIS — Z85828 Personal history of other malignant neoplasm of skin: Secondary | ICD-10-CM | POA: Insufficient documentation

## 2017-05-08 DIAGNOSIS — S32019A Unspecified fracture of first lumbar vertebra, initial encounter for closed fracture: Secondary | ICD-10-CM | POA: Diagnosis not present

## 2017-05-08 DIAGNOSIS — R2689 Other abnormalities of gait and mobility: Secondary | ICD-10-CM | POA: Insufficient documentation

## 2017-05-08 DIAGNOSIS — D72829 Elevated white blood cell count, unspecified: Secondary | ICD-10-CM | POA: Diagnosis present

## 2017-05-08 DIAGNOSIS — R52 Pain, unspecified: Secondary | ICD-10-CM

## 2017-05-08 DIAGNOSIS — E669 Obesity, unspecified: Secondary | ICD-10-CM | POA: Diagnosis not present

## 2017-05-08 DIAGNOSIS — W19XXXA Unspecified fall, initial encounter: Secondary | ICD-10-CM

## 2017-05-08 DIAGNOSIS — S32010A Wedge compression fracture of first lumbar vertebra, initial encounter for closed fracture: Secondary | ICD-10-CM

## 2017-05-08 LAB — CBC WITH DIFFERENTIAL/PLATELET
Basophils Absolute: 0 10*3/uL (ref 0.0–0.1)
Basophils Relative: 0 %
Eosinophils Absolute: 0 10*3/uL (ref 0.0–0.7)
Eosinophils Relative: 0 %
HCT: 39.3 % (ref 36.0–46.0)
Hemoglobin: 12.6 g/dL (ref 12.0–15.0)
Lymphocytes Relative: 6 %
Lymphs Abs: 0.9 10*3/uL (ref 0.7–4.0)
MCH: 29.1 pg (ref 26.0–34.0)
MCHC: 32.1 g/dL (ref 30.0–36.0)
MCV: 90.8 fL (ref 78.0–100.0)
Monocytes Absolute: 1.3 10*3/uL — ABNORMAL HIGH (ref 0.1–1.0)
Monocytes Relative: 9 %
Neutro Abs: 11.5 10*3/uL — ABNORMAL HIGH (ref 1.7–7.7)
Neutrophils Relative %: 85 %
Platelets: 257 10*3/uL (ref 150–400)
RBC: 4.33 MIL/uL (ref 3.87–5.11)
RDW: 13.7 % (ref 11.5–15.5)
WBC: 13.7 10*3/uL — ABNORMAL HIGH (ref 4.0–10.5)

## 2017-05-08 LAB — BASIC METABOLIC PANEL
Anion gap: 9 (ref 5–15)
BUN: 16 mg/dL (ref 6–20)
CO2: 28 mmol/L (ref 22–32)
Calcium: 9.3 mg/dL (ref 8.9–10.3)
Chloride: 101 mmol/L (ref 101–111)
Creatinine, Ser: 1.03 mg/dL — ABNORMAL HIGH (ref 0.44–1.00)
GFR calc Af Amer: 59 mL/min — ABNORMAL LOW (ref >60)
GFR calc non Af Amer: 51 mL/min — ABNORMAL LOW (ref >60)
Glucose, Bld: 117 mg/dL — ABNORMAL HIGH (ref 65–99)
Potassium: 4.1 mmol/L (ref 3.5–5.1)
Sodium: 138 mmol/L (ref 135–145)

## 2017-05-08 MED ORDER — MORPHINE SULFATE (PF) 2 MG/ML IV SOLN
2.0000 mg | INTRAVENOUS | Status: DC | PRN
  Administered 2017-05-08 – 2017-05-09 (×4): 2 mg via INTRAVENOUS
  Filled 2017-05-08 (×4): qty 1

## 2017-05-08 MED ORDER — ACETAMINOPHEN 325 MG PO TABS
650.0000 mg | ORAL_TABLET | Freq: Four times a day (QID) | ORAL | Status: DC | PRN
  Administered 2017-05-10 – 2017-05-12 (×3): 650 mg via ORAL
  Filled 2017-05-08 (×3): qty 2

## 2017-05-08 MED ORDER — PANTOPRAZOLE SODIUM 40 MG PO TBEC
40.0000 mg | DELAYED_RELEASE_TABLET | Freq: Two times a day (BID) | ORAL | Status: DC
Start: 2017-05-08 — End: 2017-05-12
  Administered 2017-05-08 – 2017-05-12 (×8): 40 mg via ORAL
  Filled 2017-05-08 (×8): qty 1

## 2017-05-08 MED ORDER — LEVOTHYROXINE SODIUM 88 MCG PO TABS
88.0000 ug | ORAL_TABLET | Freq: Every day | ORAL | Status: DC
  Administered 2017-05-09 – 2017-05-12 (×4): 88 ug via ORAL
  Filled 2017-05-08 (×5): qty 1

## 2017-05-08 MED ORDER — HYDRALAZINE HCL 20 MG/ML IJ SOLN: 10.0000 mg | INTRAMUSCULAR | Status: DC | PRN

## 2017-05-08 MED ORDER — ENOXAPARIN SODIUM 40 MG/0.4ML ~~LOC~~ SOLN
40.0000 mg | Freq: Every day | SUBCUTANEOUS | Status: DC
  Administered 2017-05-08 – 2017-05-11 (×4): 40 mg via SUBCUTANEOUS
  Filled 2017-05-08 (×4): qty 0.4

## 2017-05-08 MED ORDER — LOSARTAN POTASSIUM 50 MG PO TABS
50.0000 mg | ORAL_TABLET | Freq: Every day | ORAL | Status: DC
  Administered 2017-05-09 – 2017-05-10 (×2): 50 mg via ORAL
  Filled 2017-05-08 (×2): qty 1

## 2017-05-08 MED ORDER — MORPHINE SULFATE (PF) 2 MG/ML IV SOLN
2.0000 mg | Freq: Once | INTRAVENOUS | Status: AC
  Administered 2017-05-08: 2 mg via INTRAVENOUS
  Filled 2017-05-08: qty 1

## 2017-05-08 MED ORDER — ONDANSETRON HCL 4 MG PO TABS: 4.0000 mg | ORAL_TABLET | Freq: Four times a day (QID) | ORAL | Status: DC | PRN

## 2017-05-08 MED ORDER — ONDANSETRON HCL 4 MG/2ML IJ SOLN
4.0000 mg | Freq: Once | INTRAMUSCULAR | Status: AC
  Administered 2017-05-08: 4 mg via INTRAVENOUS
  Filled 2017-05-08: qty 2

## 2017-05-08 MED ORDER — HYDROMORPHONE HCL 1 MG/ML IJ SOLN
0.5000 mg | Freq: Once | INTRAMUSCULAR | Status: AC
  Administered 2017-05-08: 0.5 mg via INTRAVENOUS
  Filled 2017-05-08: qty 1

## 2017-05-08 MED ORDER — ALBUTEROL SULFATE HFA 108 (90 BASE) MCG/ACT IN AERS: 2.0000 | INHALATION_SPRAY | Freq: Four times a day (QID) | RESPIRATORY_TRACT | Status: DC | PRN

## 2017-05-08 MED ORDER — MOMETASONE FURO-FORMOTEROL FUM 200-5 MCG/ACT IN AERO
2.0000 | INHALATION_SPRAY | Freq: Every day | RESPIRATORY_TRACT | Status: DC
  Administered 2017-05-08 – 2017-05-12 (×4): 2 via RESPIRATORY_TRACT
  Filled 2017-05-08: qty 8.8

## 2017-05-08 MED ORDER — MONTELUKAST SODIUM 10 MG PO TABS
10.0000 mg | ORAL_TABLET | Freq: Every day | ORAL | Status: DC
  Administered 2017-05-08 – 2017-05-11 (×4): 10 mg via ORAL
  Filled 2017-05-08 (×5): qty 1

## 2017-05-08 MED ORDER — ONDANSETRON HCL 4 MG/2ML IJ SOLN
4.0000 mg | Freq: Four times a day (QID) | INTRAMUSCULAR | Status: DC | PRN
  Administered 2017-05-09 (×2): 4 mg via INTRAVENOUS
  Filled 2017-05-08 (×2): qty 2

## 2017-05-08 MED ORDER — OXYCODONE-ACETAMINOPHEN 5-325 MG PO TABS
1.0000 | ORAL_TABLET | Freq: Once | ORAL | Status: AC
  Administered 2017-05-08: 1 via ORAL
  Filled 2017-05-08: qty 1

## 2017-05-08 MED ORDER — ROSUVASTATIN CALCIUM 20 MG PO TABS
20.0000 mg | ORAL_TABLET | Freq: Every day | ORAL | Status: DC
  Administered 2017-05-08 – 2017-05-12 (×5): 20 mg via ORAL
  Filled 2017-05-08 (×6): qty 1

## 2017-05-08 MED ORDER — TIOTROPIUM BROMIDE MONOHYDRATE 18 MCG IN CAPS
18.0000 ug | ORAL_CAPSULE | Freq: Every day | RESPIRATORY_TRACT | Status: DC
  Administered 2017-05-09 – 2017-05-12 (×2): 18 ug via RESPIRATORY_TRACT
  Filled 2017-05-08 (×2): qty 5

## 2017-05-08 MED ORDER — ACETAMINOPHEN 650 MG RE SUPP: 650.0000 mg | Freq: Four times a day (QID) | RECTAL | Status: DC | PRN

## 2017-05-08 MED ORDER — ALBUTEROL SULFATE (2.5 MG/3ML) 0.083% IN NEBU: 2.5000 mg | INHALATION_SOLUTION | Freq: Four times a day (QID) | RESPIRATORY_TRACT | Status: DC | PRN

## 2017-05-08 MED ORDER — OXYCODONE HCL 5 MG PO TABS
5.0000 mg | ORAL_TABLET | ORAL | Status: DC | PRN
  Administered 2017-05-09 – 2017-05-12 (×15): 5 mg via ORAL
  Filled 2017-05-08 (×15): qty 1

## 2017-05-08 MED ORDER — CITALOPRAM HYDROBROMIDE 20 MG PO TABS
20.0000 mg | ORAL_TABLET | Freq: Every day | ORAL | Status: DC
  Administered 2017-05-08 – 2017-05-12 (×5): 20 mg via ORAL
  Filled 2017-05-08 (×5): qty 1

## 2017-05-08 NOTE — ED Notes (Signed)
[employer] called for TLSO brace

## 2017-05-08 NOTE — ED Notes (Signed)
I called [employer] about back brace @ 17:25. Waiting for return call from on call person.,

## 2017-05-08 NOTE — H&P (Addendum)
History and Physical    [name] CBJ:[id number] DOB: 1940-11-15 DOA: 05/08/2017  Referring MD/NP/PA: Margarita Mail PA-C PCP: Jonathon Resides, MD  Patient coming from: Home via EMS  Chief Complaint: Fall  I have personally briefly reviewed patient's old medical records in [place]  HPI: [name] is a 77 y.o. female with medical history significant of HTN, HLD, hypothyroidism, MCNS, COPD, and GERD; who presents after having a fall while standing on a chair trying to fix the plate on top of a shelf in her kitchen.  She reports falling onto her buttocks and then hitting her head against the dishwasher.  Denies having any loss of consciousness.  Thereafter reports having sharp pain in her lower back worsened by any movement, but did not radiate down her spine.  Unable to get any relief prior to arrival.  Patient otherwise been in her normal state of health prior to the incident.  Denies having any focal weakness, saddle anesthesia, difficulty urinating, fever, chills, or any other complaints.  ED Course: Upon admission into the emergency department patient noted to be afebrile, blood pressure up to 165/81, and all other vital signs relatively within normal limits.  Labs revealed WBC 13.7, BUN 16, creatinine 1.03, and all other lab work relatively within normal limits.  CT scan of the lumbar spine revealed acute burst fracture of L1 vertebral body and compression fracture of L2.  Informed that the case was discussed with Dr. Arnoldo Morale who thought fractures were nonsurgical.  A TLSO brace was applied.  Patient received pain medication of oxycodone 5 mg, 2 mg of morphine, and 0.5 mg of Dilaudid without relief of pain symptoms.  TRH called to admit for observation and pain control.  Review of Systems  Constitutional: Negative for chills, fever and malaise/fatigue.  HENT: Negative for congestion and hearing loss.   Eyes: Negative for photophobia and pain.  Respiratory: Negative for  cough and wheezing.   Cardiovascular: Negative for chest pain and leg swelling.  Gastrointestinal: Negative for constipation, diarrhea and vomiting.  Genitourinary: Negative for dysuria and frequency.  Musculoskeletal: Positive for back pain and falls.  Skin: Negative for itching and rash.       Positive for abrasion to the left shin  Neurological: Positive for headaches. Negative for sensory change, focal weakness and loss of consciousness.  Endo/Heme/Allergies: Negative for polydipsia. Does not bruise/bleed easily.  Psychiatric/Behavioral: Negative for memory loss and substance abuse.  All other systems reviewed and are negative.   Past Medical History:  Diagnosis Date  . Actinic keratosis   . Arthritis   . Asthma   . Basal cell carcinoma   . COPD (chronic obstructive pulmonary disease) ([place])   . GERD (gastroesophageal reflux disease)   . Heart murmur    hx of in childhood   . Hyperlipidemia   . Hypertension   . Hypothyroidism   . MCNS (minimal change nephrotic syndrome)   . Osteopenia   . Shortness of breath dyspnea    on exertion   . Squamous cell carcinoma     Past Surgical History:  Procedure Laterality Date  . ABDOMINAL HYSTERECTOMY    . APPENDECTOMY    . BILATERAL SALPINGOOPHORECTOMY     Ovarian Cysts   . 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 MEDIAL PARTIAL KNEE REPLACEMENT Bilateral   .  MOHS SURGERY     x 2  . RENAL BIOPSY     x 2  . ROTATOR CUFF REPAIR Left      reports that she quit smoking about 44 years ago. Her smoking use included cigarettes. She has a 30.00 pack-year smoking history. She has never used smokeless tobacco. She reports that she drinks alcohol. She reports that she does not use drugs.  No Known Allergies  Family History  Problem Relation Age of Onset  . Lymphoma Father   . Heart  disease Paternal Grandfather   . COPD Mother        Husband Smoked   . Stroke Maternal Grandfather   . Rheumatic fever Paternal Grandmother   . Hypertension Sister   . Scoliosis Daughter     Prior to Admission medications   Medication Sig Start Date End Date Taking? Authorizing Provider  Calcium Carb-Cholecalciferol (CALCIUM-VITAMIN D) 600-400 MG-UNIT TABS Take 1 tablet by mouth 2 (two) times daily.   Yes [provider]  cholecalciferol (VITAMIN D) 1000 units tablet Take 1,000 Units by mouth 2 (two) times daily.   Yes [provider]  citalopram (CELEXA) 20 MG tablet TAKE 1 TABLET BY MOUTH EVERY DAY AT NIGHT 05/01/17  Yes [provider]  DULERA 200-5 MCG/ACT AERO Inhale 2 puffs into the lungs daily.  05/08/17  Yes [provider]  levothyroxine (SYNTHROID, LEVOTHROID) 88 MCG tablet Take 88 mcg by mouth every morning. 04/17/17  Yes [provider]  losartan (COZAAR) 50 MG tablet Take 50 mg by mouth daily. 04/08/17  Yes [provider]  montelukast (SINGULAIR) 10 MG tablet TAKE 1 TABLET BY MOUTH EVERY DAY AT NIGHT 04/16/17  Yes [provider]  Multiple Vitamin (MULTIVITAMIN WITH MINERALS) TABS tablet Take 1 tablet by mouth 2 (two) times daily.   Yes [provider]  omeprazole (PRILOSEC) 20 MG capsule TAKE 1 CAPSULE (20 MG TOTAL) BY MOUTH TWO (2) TIMES A DAY. 04/07/17  Yes [provider]  rosuvastatin (CRESTOR) 40 MG tablet Take 20 mg by mouth daily. MWF   Yes [provider]  SPIRIVA RESPIMAT 2.5 MCG/ACT AERS Take 2 puffs by mouth daily.  05/08/17  Yes [provider]  albuterol (PROAIR HFA) 108 (90 BASE) MCG/ACT inhaler Inhale 2 puffs into the lungs every 6 (six) hours as needed for wheezing or shortness of breath. 07/17/13   Zanard, Bernadene Bell, MD  citalopram (CELEXA) 20 MG tablet Take 1 tablet (20 mg total) by mouth at bedtime. 07/17/13 07/17/14  Jonathon Resides, MD  docusate sodium (COLACE) 100 MG capsule  Take 1 capsule (100 mg total) by mouth 2 (two) times daily. Patient not taking: Reported on 05/08/2017 07/12/14   Danae Orleans, PA-C  ferrous sulfate 325 (65 FE) MG tablet Take 1 tablet (325 mg total) by mouth 3 (three) times daily after meals. Patient not taking: Reported on 05/08/2017 07/12/14   Danae Orleans, PA-C  HYDROcodone-acetaminophen (NORCO) 7.5-325 MG per tablet Take 1-2 tablets by mouth every 4 (four) hours as needed for moderate pain. Patient not taking: Reported on 05/08/2017 07/12/14   Danae Orleans, PA-C  levothyroxine (SYNTHROID, LEVOTHROID) 88 MCG tablet Take 1 tablet (88 mcg total) by mouth daily before breakfast. 07/17/13 07/29/14  Zanard, Bernadene Bell, MD  montelukast (SINGULAIR) 10 MG tablet Take 1 tablet (10 mg total) by mouth at bedtime. 07/17/13 07/29/14  Jonathon Resides, MD  pantoprazole (PROTONIX) 40 MG tablet Take 1 tablet (40 mg total) by mouth daily.  07/17/13 07/18/14  Jonathon Resides, MD  polyethylene glycol (MIRALAX / GLYCOLAX) packet Take 17 g by mouth 2 (two) times daily. Patient not taking: Reported on 05/08/2017 07/12/14   Danae Orleans, PA-C  rosuvastatin (CRESTOR) 40 MG tablet Take 1 tablet (40 mg total) by mouth daily. Patient taking differently: Take 20 mg by mouth daily.  07/17/13 07/18/14  Jonathon Resides, MD  tiZANidine (ZANAFLEX) 4 MG tablet Take 1 tablet (4 mg total) by mouth every 6 (six) hours as needed for muscle spasms. Patient not taking: Reported on 05/08/2017 07/12/14   Danae Orleans, PA-C    Physical Exam:  Constitutional: Elderly female in NAD, calm, comfortable Vitals:   05/08/17 1724 05/08/17 1730 05/08/17 1745 05/08/17 1900  BP:  (!) 165/81  (!) 113/103  Pulse:  70 78 75  Temp:      TempSrc:      SpO2:  91% 99% 95%  Weight: 88.9 kg (196 lb)     Height: 5\' 6"  (1.676 m)      Eyes: PERRL, lids and conjunctivae normal ENMT: Mucous membranes are moist. Posterior pharynx clear of any exudate or lesions.  Neck: normal, supple, no masses, no  thyromegaly Respiratory: clear to auscultation bilaterally, no wheezing, no crackles. Normal respiratory effort. No accessory muscle use.  Cardiovascular: Regular rate and rhythm, no murmurs / rubs / gallops. No extremity edema. 2+ pedal pulses. No carotid bruits.  Abdomen: no tenderness, no masses palpated. No hepatosplenomegaly. Bowel sounds positive.  Musculoskeletal: no clubbing / cyanosis.  Patient wearing TLSO brace. Normal muscle tone.  Skin: Approximately  3 cm vertical abrasion to the left shin. No induration Neurologic: CN 2-12 grossly intact. Sensation intact, DTR normal. Strength 5/5 in all 4.  Psychiatric: Normal judgment and insight. Alert and oriented x 3. Normal mood.     Labs on Admission: I have personally reviewed following labs and imaging studies  CBC: Recent Labs  Lab 05/08/17 1713  WBC 13.7*  NEUTROABS 11.5*  HGB 12.6  HCT 39.3  MCV 90.8  PLT 093   Basic Metabolic Panel: Recent Labs  Lab 05/08/17 1713  NA 138  K 4.1  CL 101  CO2 28  GLUCOSE 117*  BUN 16  CREATININE 1.03*  CALCIUM 9.3   GFR: Estimated Creatinine Clearance: 51.3 mL/min (A) (by C-G formula based on SCr of 1.03 mg/dL (H)). Liver Function Tests: No results for input(s): AST, ALT, ALKPHOS, BILITOT, PROT, ALBUMIN in the last 168 hours. No results for input(s): LIPASE, AMYLASE in the last 168 hours. No results for input(s): AMMONIA in the last 168 hours. Coagulation Profile: No results for input(s): INR, PROTIME in the last 168 hours. Cardiac Enzymes: No results for input(s): CKTOTAL, CKMB, CKMBINDEX, TROPONINI in the last 168 hours. BNP (last 3 results) No results for input(s): PROBNP in the last 8760 hours. HbA1C: No results for input(s): HGBA1C in the last 72 hours. CBG: No results for input(s): GLUCAP in the last 168 hours. Lipid Profile: No results for input(s): CHOL, HDL, LDLCALC, TRIG, CHOLHDL, LDLDIRECT in the last 72 hours. Thyroid Function Tests: No results for  input(s): TSH, T4TOTAL, FREET4, T3FREE, THYROIDAB in the last 72 hours. Anemia Panel: No results for input(s): VITAMINB12, FOLATE, FERRITIN, TIBC, IRON, RETICCTPCT in the last 72 hours. Urine analysis:    Component Value Date/Time   COLORURINE YELLOW 07/04/2014 [place] 07/04/2014 1317   LABSPEC 1.011 07/04/2014 1317   PHURINE 6.5 07/04/2014 [place] 07/04/2014 1317  HGBUR NEGATIVE 07/04/2014 [place] 07/04/2014 1317   BILIRUBINUR Neg 01/12/2013 1604   KETONESUR NEGATIVE 07/04/2014 1317   PROTEINUR NEGATIVE 07/04/2014 1317   UROBILINOGEN 0.2 07/04/2014 1317   NITRITE NEGATIVE 07/04/2014 1317   LEUKOCYTESUR NEGATIVE 07/04/2014 1317   Sepsis Labs: No results found for this or any previous visit (from the past 240 hour(s)).   Radiological Exams on Admission: Dg Lumbar Spine Complete  Result Date: 05/08/2017 CLINICAL DATA:  Low back and left leg pain after fall. EXAM: LUMBAR SPINE - COMPLETE 4+ VIEW COMPARISON:  None. FINDINGS: Moderate compression deformity of L1 vertebral body is noted consistent with acute fracture. No spondylolisthesis is noted. Moderate degenerative disc disease is noted at L4-5 and L5-S1. IMPRESSION: Probable acute compression fracture of L1 vertebral body. CT scan may be performed for further evaluation. Electronically Signed   By: Marijo Conception, M.D.   On: 05/08/2017 15:29   Dg Tibia/fibula Left  Result Date: 05/08/2017 CLINICAL DATA:  Fall.  Swelling about anterior tibia. EXAM: LEFT TIBIA AND FIBULA - 2 VIEW COMPARISON:  None. FINDINGS: Left medial hemiarthroplasty. Osteopenia. No acute fracture or dislocation. Moderate degenerate changes of the patellofemoral articulation of the knee. No knee joint effusion. Small Achilles and calcaneal spurs. IMPRESSION: Postoperative and degenerative changes.  No acute findings. Electronically Signed   By: Abigail Miyamoto M.D.   On: 05/08/2017 15:26   Ct Head Wo  Contrast  Result Date: 05/08/2017 CLINICAL DATA:  Fall.  Head injury. EXAM: CT HEAD WITHOUT CONTRAST TECHNIQUE: Contiguous axial images were obtained from the base of the skull through the vertex without intravenous contrast. COMPARISON:  None. FINDINGS: Brain: cerebral atrophy with resultant prominence of the extra-axial spaces adjacent the frontal lobes. No mass lesion, hemorrhage, hydrocephalus, acute infarct, intra-axial, or extra-axial fluid collection. Vascular: Intracranial atherosclerosis. Skull: Hyperostosis frontalis interna. No displaced fractures or significant soft tissue swelling. Sinuses/Orbits: Normal imaged portions of the orbits and globes. Clear paranasal sinuses and mastoid air cells. Other: None. IMPRESSION: 1.  No acute intracranial abnormality. 2. Cerebral atrophy. Electronically Signed   By: Abigail Miyamoto M.D.   On: 05/08/2017 15:29   Ct Lumbar Spine Wo Contrast  Result Date: 05/08/2017 CLINICAL DATA:  Low back pain status post fall. EXAM: CT LUMBAR SPINE WITHOUT CONTRAST TECHNIQUE: Multidetector CT imaging of the lumbar spine was performed without intravenous contrast administration. Multiplanar CT image reconstructions were also generated. COMPARISON:  None. FINDINGS: Segmentation: 5 lumbar type vertebrae. Alignment: Normal. Vertebrae: L1 vertebral body compression fracture with approximately 50% height loss and 6 mm retropulsion of the superior posterior margin of the L1 vertebral body mildly impressing on the spinal canal resulting in mild spinal stenosis. L2 vertebral body compression fracture without significant height loss and 3 mm of retropulsion of the superior posterior margin of the L2 vertebral body without significant impression on the thecal sac. Remainder the vertebral body heights are maintained. No aggressive osseous lesion. Paraspinal and other soft tissues: No acute paraspinal abnormality. Abdominal aortic atherosclerosis. Disc levels: Degenerative disc disease with  disc height loss at L5-S1 and to lesser extent L4-5. moderate bilateral facet arthropathy at L1-2. Broad-based disc bulge at L2-3 with mild bilateral facet arthropathy. Mild broad-based disc bulge and bilateral facet arthropathy at L3-4 and L4-5. IMPRESSION: 1. Acute L1 vertebral body compression fracture with approximately 50% height loss and 6 mm of retropulsion of the superior posterior margin of the L1 vertebral body mildly impressing on the spinal canal. 2. Acute L2 vertebral body  compression fracture without significant height loss and 3 mm of retropulsion of the superior posterior margin of the L2 vertebral body. Electronically Signed   By: Kathreen Devoid   On: 05/08/2017 16:36    Lumbar x-ray: Independently reviewed.  Showing compression fracture loss of L1 vertebral vertebrae  Assessment/Plan Lumbar compression fracture L1-2, fall from chair: Acute.  Patient had fall from chair at home.  Found to have acute lumbar fractures of L1 and L2 on CT scan.  TLSO brace applied.  Neurosurgery was notified, but appears to be nonsurgical.  Initial pain control inadequate. - Admit to a MedSurg bed - Incentive spirometry - Continue TLSO brace - Oxycodone/IV morphine prn moderate/severe pain respectively - Physical therapy to eval and treat in a.m.  Leukocytosis: Acute.  WBC elevated at 13.7 on admission.  Likely secondary to acute fracture. - Recheck CBC in a.m.  Essential hypertension: - Continue losartan  COPD, without acute exacerbation: Lung sounds clear to auscultation. - Continue albuterol inhaler, Dulera, Singulair, and Spiriva  Hypothyroidism - Continue levothyroxine   Hyperlipidemia - Continue Crestor  DVT prophylaxis:lovenox Code Status: full  Family Communication: No family present at bedside Disposition Plan: To be determined Consults called: None Admission status: Observation  Norval Morton MD [facility] Pager [phone]   If 7PM-7AM, please contact  night-coverage [email or web address] Password [email or web address]  05/08/2017, 9:06 PM

## 2017-05-08 NOTE — ED Provider Notes (Signed)
3:58 PM BP (!) 146/88 (BP Location: Left Arm)   Pulse 74   Temp 98.4 F (36.9 C) (Oral)   Wt 88.9 kg (196 lb)   SpO2 94%   BMI 31.64 kg/m  Patient taken in sign out from [place]. Patient fell off chair. She has an acute L1 Compression fracture.  CT scan pending for further workup.  Pending evaluation may need consult with neurosurgery versus TLSO splint and discharge.   Patient burst fracture with some retropulsion of the bony tissue pressing against the spinal cord.  She has no paresthesia, numbness, severe pain radiating down the legs or weakness.  I spoke with Dr. Arnoldo Morale who asks for a clamshell TLSO.  Patient fitted however she states that she is in too much pain to be discharged.  She will be admitted by Dr. Tamala Julian for pain control.   Margarita Mail, PA-C 05/08/17 2354    Pixie Casino, MD 05/09/17 1539

## 2017-05-08 NOTE — ED Provider Notes (Signed)
[facility] DEPT Provider Note   CSN: [id number] Arrival date & time: 05/08/17  1337     History   Chief Complaint Chief Complaint  Patient presents with  . Back Pain    HPI [name] is a 77 y.o. female with history of COPD, GERD, HLD, HTN, osteopenia status post bilateral knee replacement presents today for evaluation of acute onset, constant midline low back pain secondary to injury earlier today.  She states that at around 11:30 AM she was standing on a chair attempting to move a plate in 1 of her cabinets when the chair gave out underneath her.  She states she landed on her buttocks but the back of her head hit a cabinet or her dishwasher.  She denies loss of consciousness but endorses posterior headache.  She is not on blood thinners.  She denies amnesia, nausea, vomiting, vision changes, numbness, tingling, or weakness.  She does note her left shin hit a portion of the chair and she has some resultant swelling.  She notes constant stabbing pain to the middle of the back which radiates to the bilateral buttocks.  She denies hip pain.  She notes pain worsens with plantarflexion of her feet.  She states that she has excruciating pain when she attempts to bend and has not been able to ambulate since the injury.  She states that after the injury she crawled across her home to get a hold of a phone.  She denies bowel or bladder incontinence, fevers, IV drug use, or saddle anesthesia.  The history is provided by the patient.    Past Medical History:  Diagnosis Date  . Actinic keratosis   . Arthritis   . Asthma   . Basal cell carcinoma   . COPD (chronic obstructive pulmonary disease) ([place])   . GERD (gastroesophageal reflux disease)   . Heart murmur    hx of in childhood   . Hyperlipidemia   . Hypertension   . Hypothyroidism   . MCNS (minimal change nephrotic syndrome)   . Osteopenia   . Shortness of breath dyspnea    on exertion   . Squamous  cell carcinoma     Patient Active Problem List   Diagnosis Date Noted  . 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 S/P knee replacement 07/10/2014  . COLD (chronic obstructive lung disease) ([place]) 09/24/2013  . Throat pain 09/24/2013  . Other specified acquired hypothyroidism 09/24/2013  . Impaired fasting glucose 09/24/2013  . Anxiety state, unspecified 04/23/2013  . Insomnia 04/23/2013  . Routine general medical examination at a health care facility 04/02/2013  . Backache 04/02/2013  . Osteopenia 04/02/2013  . Unspecified hypothyroidism 07/24/2012  . Essential hypertension, benign 07/24/2012  . Sleep disturbances 07/24/2012  . Allergic rhinitis 07/24/2012  . Arthritis 07/24/2012  . Other and unspecified hyperlipidemia 07/24/2012  . ALLERGIC RHINITIS 04/20/2007  . ASTHMA 04/20/2007  . DYSPNEA 04/20/2007    Past Surgical History:  Procedure Laterality Date  . ABDOMINAL HYSTERECTOMY    . APPENDECTOMY    . BILATERAL SALPINGOOPHORECTOMY     Ovarian Cysts   . 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 MEDIAL PARTIAL KNEE REPLACEMENT Bilateral   . MOHS SURGERY     x 2  . RENAL BIOPSY     x 2  . ROTATOR CUFF REPAIR Left  OB History   None      Home Medications    Prior to Admission medications   Medication Sig Start Date End Date Taking? Authorizing Provider  albuterol (PROAIR HFA) 108 (90 BASE) MCG/ACT inhaler Inhale 2 puffs into the lungs every 6 (six) hours as needed for wheezing or shortness of breath. 07/17/13   Zanard, Bernadene Bell, MD  budesonide-formoterol (SYMBICORT) 160-4.5 MCG/ACT inhaler Inhale 2 puffs into the lungs 2 times daily at 12 noon and 4 pm.     [provider]  Calcium Carb-Cholecalciferol (CALCIUM-VITAMIN D) 600-400 MG-UNIT TABS Take 1 tablet by mouth 2 (two) times daily.    [provider]    citalopram (CELEXA) 20 MG tablet Take 1 tablet (20 mg total) by mouth at bedtime. 07/17/13 07/17/14  Jonathon Resides, MD  docusate sodium (COLACE) 100 MG capsule Take 1 capsule (100 mg total) by mouth 2 (two) times daily. 07/12/14   Danae Orleans, PA-C  ferrous sulfate 325 (65 FE) MG tablet Take 1 tablet (325 mg total) by mouth 3 (three) times daily after meals. 07/12/14   Danae Orleans, PA-C  fluticasone (FLONASE) 50 MCG/ACT nasal spray Place 1-2 sprays into both nostrils daily as needed for allergies or rhinitis.    [provider]  HYDROcodone-acetaminophen (NORCO) 7.5-325 MG per tablet Take 1-2 tablets by mouth every 4 (four) hours as needed for moderate pain. 07/12/14   Danae Orleans, PA-C  levothyroxine (SYNTHROID, LEVOTHROID) 88 MCG tablet Take 1 tablet (88 mcg total) by mouth daily before breakfast. 07/17/13 07/29/14  Zanard, Bernadene Bell, MD  montelukast (SINGULAIR) 10 MG tablet Take 1 tablet (10 mg total) by mouth at bedtime. 07/17/13 07/29/14  Jonathon Resides, MD  Multiple Vitamin (MULTIVITAMIN WITH MINERALS) TABS tablet Take 1 tablet by mouth 2 (two) times daily.    [provider]  Omega-3 Fatty Acids (OMEGA 3 PO) Take 1 tablet by mouth 2 (two) times daily.    [provider]  pantoprazole (PROTONIX) 40 MG tablet Take 1 tablet (40 mg total) by mouth daily. 07/17/13 07/18/14  Jonathon Resides, MD  polyethylene glycol (MIRALAX / GLYCOLAX) packet Take 17 g by mouth 2 (two) times daily. 07/12/14   Danae Orleans, PA-C  polyvinyl alcohol (LIQUIFILM TEARS) 1.4 % ophthalmic solution Place 1-2 drops into both eyes daily as needed for dry eyes.    [provider]  rosuvastatin (CRESTOR) 40 MG tablet Take 1 tablet (40 mg total) by mouth daily. Patient taking differently: Take 20 mg by mouth daily.  07/17/13 07/18/14  Jonathon Resides, MD  tiZANidine (ZANAFLEX) 4 MG tablet Take 1 tablet (4 mg total) by mouth every 6 (six) hours as needed for muscle spasms. 07/12/14   Danae Orleans, PA-C   valsartan-hydrochlorothiazide (DIOVAN-HCT) 320-25 MG per tablet Take 1 tablet by mouth every morning.    [provider]    Family History Family History  Problem Relation Age of Onset  . Lymphoma Father   . Heart disease Paternal Grandfather   . COPD Mother        Husband Smoked   . Stroke Maternal Grandfather   . Rheumatic fever Paternal Grandmother   . Hypertension Sister   . Scoliosis Daughter     Social History Social History   Tobacco Use  . Smoking status: Former Smoker    Packs/day: 1.50    Years: 20.00    Pack years: 30.00    Types: Cigarettes    Last attempt to quit: 01/12/1973  Years since quitting: 44.3  . Smokeless tobacco: Never Used  Substance Use Topics  . Alcohol use: Yes    Alcohol/week: 0.0 oz    Types: 1 Standard drinks or equivalent per week    Comment: She occasionally drinks a glass of wine.    . Drug use: No     Allergies   Patient has no known allergies.   Review of Systems Review of Systems  Constitutional: Negative for chills and fever.  Eyes: Negative for visual disturbance.  Respiratory: Negative for shortness of breath.   Cardiovascular: Negative for chest pain.  Gastrointestinal: Negative for abdominal pain, nausea and vomiting.  Musculoskeletal: Positive for back pain. Negative for neck pain.  Neurological: Positive for headaches. Negative for syncope, weakness and numbness.  All other systems reviewed and are negative.    Physical Exam Updated Vital Signs BP (!) 146/88 (BP Location: Left Arm)   Pulse 74   Temp 98.4 F (36.9 C) (Oral)   Wt 88.9 kg (196 lb)   SpO2 94%   BMI 31.64 kg/m   Physical Exam  Constitutional: She is oriented to person, place, and time. She appears well-developed and well-nourished. No distress.  Laying in the right lateral decubitus position, appears somewhat uncomfortable  HENT:  Head: Normocephalic.  No Battle's signs, no raccoon's eyes, no rhinorrhea. No hemotympanum. No  tenderness to palpation of the face.  She has mild tenderness to palpation focally along the occiput with some swelling and mild ecchymosis but no underlying crepitus.  No deformity, crepitus, or swelling noted.   Eyes: Conjunctivae are normal. Right eye exhibits no discharge. Left eye exhibits no discharge.  Neck: Normal range of motion. No JVD present. No tracheal deviation present.  Cardiovascular: Normal rate, regular rhythm, normal heart sounds and intact distal pulses.  2+ radial and DP/PT pulses bl, negative Homan's bl   Pulmonary/Chest: Effort normal and breath sounds normal.  Abdominal: Soft. Bowel sounds are normal. She exhibits no distension. There is no tenderness.  Musculoskeletal: She exhibits tenderness. She exhibits no edema.  No midline cervical spine tenderness or midline thoracic spine tenderness.  No paracervical or parathoracic muscle tenderness.  She has midline lumbar spine pain focally at around the level of L1 through L2 and paralumbar tenderness at around L3 through S1.  No deformity, crepitus, or step-off noted.  No SI joint tenderness.  No pelvic instability noted.  Palpation of the hips or knees.  No ligamentous laxity noted.  She is unable to flex or extend her lumbar spine secondary to pain.  She is laying in the right lateral decubitus position and so assessment of strength is somewhat limited but she has 5/5 strength of right lower extremity with flexion and extension against resistance. 3cm area of swelling with superficial abrasion to the left shin with associated tenderness but no underlying crepitus.   Neurological: She is alert and oriented to person, place, and time. No cranial nerve deficit or sensory deficit. She exhibits normal muscle tone.  Mental Status:  Alert, thought content appropriate, able to give a coherent history. Speech fluent without evidence of aphasia. Able to follow 2 step commands without difficulty.  Cranial Nerves:  II:  Peripheral visual  fields grossly normal, pupils equal, round, reactive to light III,IV, VI: ptosis not present, extra-ocular motions intact bilaterally  V,VII: smile symmetric, facial light touch sensation equal VIII: hearing grossly normal to voice  X: uvula elevates symmetrically  XI: bilateral shoulder shrug symmetric and strong XII: midline tongue extension without  fassiculations Motor:  Normal tone. Sensory: light touch normal in all extremities. Cerebellar: normal finger-to-nose with bilateral upper extremities    Skin: Skin is warm and dry. No erythema.  Psychiatric: She has a normal mood and affect. Her behavior is normal.  Nursing note and vitals reviewed.    ED Treatments / Results  Labs (all labs ordered are listed, but only abnormal results are displayed) Labs Reviewed  CBC WITH DIFFERENTIAL/PLATELET  BASIC METABOLIC PANEL    EKG None  Radiology Dg Lumbar Spine Complete  Result Date: 05/08/2017 CLINICAL DATA:  Low back and left leg pain after fall. EXAM: LUMBAR SPINE - COMPLETE 4+ VIEW COMPARISON:  None. FINDINGS: Moderate compression deformity of L1 vertebral body is noted consistent with acute fracture. No spondylolisthesis is noted. Moderate degenerative disc disease is noted at L4-5 and L5-S1. IMPRESSION: Probable acute compression fracture of L1 vertebral body. CT scan may be performed for further evaluation. Electronically Signed   By: Marijo Conception, M.D.   On: 05/08/2017 15:29   Dg Tibia/fibula Left  Result Date: 05/08/2017 CLINICAL DATA:  Fall.  Swelling about anterior tibia. EXAM: LEFT TIBIA AND FIBULA - 2 VIEW COMPARISON:  None. FINDINGS: Left medial hemiarthroplasty. Osteopenia. No acute fracture or dislocation. Moderate degenerate changes of the patellofemoral articulation of the knee. No knee joint effusion. Small Achilles and calcaneal spurs. IMPRESSION: Postoperative and degenerative changes.  No acute findings. Electronically Signed   By: Abigail Miyamoto M.D.   On:  05/08/2017 15:26   Ct Head Wo Contrast  Result Date: 05/08/2017 CLINICAL DATA:  Fall.  Head injury. EXAM: CT HEAD WITHOUT CONTRAST TECHNIQUE: Contiguous axial images were obtained from the base of the skull through the vertex without intravenous contrast. COMPARISON:  None. FINDINGS: Brain: cerebral atrophy with resultant prominence of the extra-axial spaces adjacent the frontal lobes. No mass lesion, hemorrhage, hydrocephalus, acute infarct, intra-axial, or extra-axial fluid collection. Vascular: Intracranial atherosclerosis. Skull: Hyperostosis frontalis interna. No displaced fractures or significant soft tissue swelling. Sinuses/Orbits: Normal imaged portions of the orbits and globes. Clear paranasal sinuses and mastoid air cells. Other: None. IMPRESSION: 1.  No acute intracranial abnormality. 2. Cerebral atrophy. Electronically Signed   By: Abigail Miyamoto M.D.   On: 05/08/2017 15:29   Ct Lumbar Spine Wo Contrast  Result Date: 05/08/2017 CLINICAL DATA:  Low back pain status post fall. EXAM: CT LUMBAR SPINE WITHOUT CONTRAST TECHNIQUE: Multidetector CT imaging of the lumbar spine was performed without intravenous contrast administration. Multiplanar CT image reconstructions were also generated. COMPARISON:  None. FINDINGS: Segmentation: 5 lumbar type vertebrae. Alignment: Normal. Vertebrae: L1 vertebral body compression fracture with approximately 50% height loss and 6 mm retropulsion of the superior posterior margin of the L1 vertebral body mildly impressing on the spinal canal resulting in mild spinal stenosis. L2 vertebral body compression fracture without significant height loss and 3 mm of retropulsion of the superior posterior margin of the L2 vertebral body without significant impression on the thecal sac. Remainder the vertebral body heights are maintained. No aggressive osseous lesion. Paraspinal and other soft tissues: No acute paraspinal abnormality. Abdominal aortic atherosclerosis. Disc levels:  Degenerative disc disease with disc height loss at L5-S1 and to lesser extent L4-5. moderate bilateral facet arthropathy at L1-2. Broad-based disc bulge at L2-3 with mild bilateral facet arthropathy. Mild broad-based disc bulge and bilateral facet arthropathy at L3-4 and L4-5. IMPRESSION: 1. Acute L1 vertebral body compression fracture with approximately 50% height loss and 6 mm of retropulsion of the superior posterior margin  of the L1 vertebral body mildly impressing on the spinal canal. 2. Acute L2 vertebral body compression fracture without significant height loss and 3 mm of retropulsion of the superior posterior margin of the L2 vertebral body. Electronically Signed   By: Kathreen Devoid   On: 05/08/2017 16:36    Procedures Procedures (including critical care time)  Medications Ordered in ED Medications  oxyCODONE-acetaminophen (PERCOCET/ROXICET) 5-325 MG per tablet 1 tablet (1 tablet Oral Given 05/08/17 1518)  morphine 2 MG/ML injection 2 mg (2 mg Intravenous Given 05/08/17 1710)     Initial Impression / Assessment and Plan / ED Course  I have reviewed the triage vital signs and the nursing notes.  Pertinent labs & imaging results that were available during my care of the patient were reviewed by me and considered in my medical decision making (see chart for details).     Patient presents today for evaluation of acute onset of low back pain, mild occipital headache, and left shin pain after fall off a chair earlier today.  She is afebrile, vital signs are stable.  She is nontoxic in appearance.  No focal neurologic deficits.  Her examination is limited secondary to her pain, and she is only able to lay on the right lateral decubitus position.  She has midline lumbar spine pain.  CT of the head shows no acute intracranial abnormalities, no evidence of skull fracture or ICH.  X-rays of the left tibia and fibula show postoperative and degenerative changes but no acute findings.  Examinations of  the bilateral knees are within normal limits.  X-ray of the lumbar spine shows a probable acute compression fracture of the L1 vertebral body and recommend CT scan for further evaluation.  We will also obtain baseline labs.  4:30PM Signed out to oncoming provider PA Harris.  We are awaiting CT scan of the lumbar spine and baseline labs.  Patient's pain is under control at this time.  TLSO brace was ordered preemptively.  If imaging shows retropulsion or impingement of the spine, neurosurgical consult would likely be needed.  Disposition pending imaging.  If patient's pain can be managed and she is ambulatory with non-concerning findings on her CT scan she will likely be stable for discharge home.  Final Clinical Impressions(s) / ED Diagnoses   Final diagnoses:  Traumatic compression fracture of L1 lumbar vertebra, closed, initial encounter [facility])  Intractable acute post-traumatic headache  Fall, initial encounter    ED Discharge Orders    None       Renita Papa, PA-C 05/08/17 1721    Francine Graven, DO 05/09/17 1452

## 2017-05-08 NOTE — ED Notes (Signed)
ED TO INPATIENT HANDOFF REPORT  Name/Age/Gender [name] 77 y.o. female  Code Status    Code Status Orders  (From admission, onward)        Start     Ordered   05/08/17 2133  Full code  Continuous     05/08/17 2134    Code Status History    Date Active Date Inactive Code Status Order ID Comments User Context   07/10/2014 1553 07/12/2014 1631 Full Code [id number]  Norman Herrlich Inpatient      Home/SNF/Other Home  Chief Complaint fall  Level of Care/Admitting Diagnosis ED Disposition    ED Disposition Condition Comment   [place]: [facility] [[id number]]  Level of Care: Med-Surg [16]  Diagnosis: Closed post-traumatic compression fracture of lumbar vertebra, initial encounter [facility]) [[id number]]  Admitting Physician: Norval Morton [[id number]]  Attending Physician: Norval Morton [[id number]]  PT Class (Do Not Modify): Observation [104]  PT Acc Code (Do Not Modify): Observation [10022]       Medical History Past Medical History:  Diagnosis Date  . Actinic keratosis   . Arthritis   . Asthma   . Basal cell carcinoma   . COPD (chronic obstructive pulmonary disease) ([place])   . GERD (gastroesophageal reflux disease)   . Heart murmur    hx of in childhood   . Hyperlipidemia   . Hypertension   . Hypothyroidism   . MCNS (minimal change nephrotic syndrome)   . Osteopenia   . Shortness of breath dyspnea    on exertion   . Squamous cell carcinoma     Allergies No Known Allergies  IV Location/Drains/Wounds Patient Lines/Drains/Airways Status   Active Line/Drains/Airways    Name:   Placement date:   Placement time:   Site:   Days:   Peripheral IV 05/08/17 Left Forearm   05/08/17    1710    Forearm   less than 1          Labs/Imaging Results for orders placed or performed during the hospital encounter of 05/08/17 (from the past 48 hour(s))  CBC with Differential     Status: Abnormal   Collection Time: 05/08/17  5:13  PM  Result Value Ref Range   WBC 13.7 (H) 4.0 - 10.5 K/uL   RBC 4.33 3.87 - 5.11 MIL/uL   Hemoglobin 12.6 12.0 - 15.0 g/dL   HCT 39.3 36.0 - 46.0 %   MCV 90.8 78.0 - 100.0 fL   MCH 29.1 26.0 - 34.0 pg   MCHC 32.1 30.0 - 36.0 g/dL   RDW 13.7 11.5 - 15.5 %   Platelets 257 150 - 400 K/uL   Neutrophils Relative % 85 %   Neutro Abs 11.5 (H) 1.7 - 7.7 K/uL   Lymphocytes Relative 6 %   Lymphs Abs 0.9 0.7 - 4.0 K/uL   Monocytes Relative 9 %   Monocytes Absolute 1.3 (H) 0.1 - 1.0 K/uL   Eosinophils Relative 0 %   Eosinophils Absolute 0.0 0.0 - 0.7 K/uL   Basophils Relative 0 %   Basophils Absolute 0.0 0.0 - 0.1 K/uL    Comment: Performed at [facility], [place] [address], [place], [place] [address]  Basic metabolic panel     Status: Abnormal   Collection Time: 05/08/17  5:13 PM  Result Value Ref Range   Sodium 138 135 - 145 mmol/L   Potassium 4.1 3.5 - 5.1 mmol/L   Chloride 101 101 - 111  mmol/L   CO2 28 22 - 32 mmol/L   Glucose, Bld 117 (H) 65 - 99 mg/dL   BUN 16 6 - 20 mg/dL   Creatinine, Ser 1.03 (H) 0.44 - 1.00 mg/dL   Calcium 9.3 8.9 - 10.3 mg/dL   GFR calc non Af Amer 51 (L) >60 mL/min   GFR calc Af Amer 59 (L) >60 mL/min    Comment: (NOTE) The eGFR has been calculated using the CKD EPI equation. This calculation has not been validated in all clinical situations. eGFR's persistently <60 mL/min signify possible Chronic Kidney Disease.    Anion gap 9 5 - 15    Comment: Performed at [facility], [place] [address], [place], [place] [address]   Dg Lumbar Spine Complete  Result Date: 05/08/2017 CLINICAL DATA:  Low back and left leg pain after fall. EXAM: LUMBAR SPINE - COMPLETE 4+ VIEW COMPARISON:  None. FINDINGS: Moderate compression deformity of L1 vertebral body is noted consistent with acute fracture. No spondylolisthesis is noted. Moderate degenerative disc disease is noted at L4-5 and L5-S1. IMPRESSION: Probable acute compression fracture of  L1 vertebral body. CT scan may be performed for further evaluation. Electronically Signed   By: Marijo Conception, M.D.   On: 05/08/2017 15:29   Dg Tibia/fibula Left  Result Date: 05/08/2017 CLINICAL DATA:  Fall.  Swelling about anterior tibia. EXAM: LEFT TIBIA AND FIBULA - 2 VIEW COMPARISON:  None. FINDINGS: Left medial hemiarthroplasty. Osteopenia. No acute fracture or dislocation. Moderate degenerate changes of the patellofemoral articulation of the knee. No knee joint effusion. Small Achilles and calcaneal spurs. IMPRESSION: Postoperative and degenerative changes.  No acute findings. Electronically Signed   By: Abigail Miyamoto M.D.   On: 05/08/2017 15:26   Ct Head Wo Contrast  Result Date: 05/08/2017 CLINICAL DATA:  Fall.  Head injury. EXAM: CT HEAD WITHOUT CONTRAST TECHNIQUE: Contiguous axial images were obtained from the base of the skull through the vertex without intravenous contrast. COMPARISON:  None. FINDINGS: Brain: cerebral atrophy with resultant prominence of the extra-axial spaces adjacent the frontal lobes. No mass lesion, hemorrhage, hydrocephalus, acute infarct, intra-axial, or extra-axial fluid collection. Vascular: Intracranial atherosclerosis. Skull: Hyperostosis frontalis interna. No displaced fractures or significant soft tissue swelling. Sinuses/Orbits: Normal imaged portions of the orbits and globes. Clear paranasal sinuses and mastoid air cells. Other: None. IMPRESSION: 1.  No acute intracranial abnormality. 2. Cerebral atrophy. Electronically Signed   By: Abigail Miyamoto M.D.   On: 05/08/2017 15:29   Ct Lumbar Spine Wo Contrast  Result Date: 05/08/2017 CLINICAL DATA:  Low back pain status post fall. EXAM: CT LUMBAR SPINE WITHOUT CONTRAST TECHNIQUE: Multidetector CT imaging of the lumbar spine was performed without intravenous contrast administration. Multiplanar CT image reconstructions were also generated. COMPARISON:  None. FINDINGS: Segmentation: 5 lumbar type vertebrae. Alignment:  Normal. Vertebrae: L1 vertebral body compression fracture with approximately 50% height loss and 6 mm retropulsion of the superior posterior margin of the L1 vertebral body mildly impressing on the spinal canal resulting in mild spinal stenosis. L2 vertebral body compression fracture without significant height loss and 3 mm of retropulsion of the superior posterior margin of the L2 vertebral body without significant impression on the thecal sac. Remainder the vertebral body heights are maintained. No aggressive osseous lesion. Paraspinal and other soft tissues: No acute paraspinal abnormality. Abdominal aortic atherosclerosis. Disc levels: Degenerative disc disease with disc height loss at L5-S1 and to lesser extent L4-5. moderate bilateral facet arthropathy at L1-2. Broad-based disc bulge  at L2-3 with mild bilateral facet arthropathy. Mild broad-based disc bulge and bilateral facet arthropathy at L3-4 and L4-5. IMPRESSION: 1. Acute L1 vertebral body compression fracture with approximately 50% height loss and 6 mm of retropulsion of the superior posterior margin of the L1 vertebral body mildly impressing on the spinal canal. 2. Acute L2 vertebral body compression fracture without significant height loss and 3 mm of retropulsion of the superior posterior margin of the L2 vertebral body. Electronically Signed   By: Kathreen Devoid   On: 05/08/2017 16:36    Pending Labs Unresulted Labs (From admission, onward)   Start     Ordered   05/09/17 2800  Basic metabolic panel  Tomorrow morning,   R     05/08/17 2134   05/09/17 0500  CBC  Tomorrow morning,   R     05/08/17 2134      Vitals/Pain Today's Vitals   05/08/17 1900 05/08/17 1911 05/08/17 1930 05/08/17 2000  BP: (!) 113/103  (!) 150/90 (!) 151/89  Pulse: 75  73 77  Temp:      TempSrc:      SpO2: 95%  96% 96%  Weight:      Height:      PainSc:  0-No pain      Isolation Precautions No active isolations  Medications Medications  citalopram  (CELEXA) tablet 20 mg (has no administration in time range)  mometasone-formoterol (DULERA) 200-5 MCG/ACT inhaler 2 puff (has no administration in time range)  pantoprazole (PROTONIX) EC tablet 40 mg (has no administration in time range)  montelukast (SINGULAIR) tablet 10 mg (has no administration in time range)  losartan (COZAAR) tablet 50 mg (has no administration in time range)  levothyroxine (SYNTHROID, LEVOTHROID) tablet 88 mcg (has no administration in time range)  rosuvastatin (CRESTOR) tablet 20 mg (has no administration in time range)  tiotropium (SPIRIVA) inhalation capsule 18 mcg (has no administration in time range)  enoxaparin (LOVENOX) injection 40 mg (has no administration in time range)  acetaminophen (TYLENOL) tablet 650 mg (has no administration in time range)    Or  acetaminophen (TYLENOL) suppository 650 mg (has no administration in time range)  oxyCODONE (Oxy IR/ROXICODONE) immediate release tablet 5 mg (has no administration in time range)  morphine 2 MG/ML injection 2 mg (has no administration in time range)  ondansetron (ZOFRAN) tablet 4 mg (has no administration in time range)    Or  ondansetron (ZOFRAN) injection 4 mg (has no administration in time range)  albuterol (PROVENTIL) (2.5 MG/3ML) 0.083% nebulizer solution 2.5 mg (has no administration in time range)  hydrALAZINE (APRESOLINE) injection 10 mg (has no administration in time range)  oxyCODONE-acetaminophen (PERCOCET/ROXICET) 5-325 MG per tablet 1 tablet (1 tablet Oral Given 05/08/17 1518)  morphine 2 MG/ML injection 2 mg (2 mg Intravenous Given 05/08/17 1710)  HYDROmorphone (DILAUDID) injection 0.5 mg (0.5 mg Intravenous Given 05/08/17 2019)  ondansetron (ZOFRAN) injection 4 mg (4 mg Intravenous Given 05/08/17 2018)    Mobility Walks

## 2017-05-08 NOTE — ED Triage Notes (Signed)
Pt reports she was standing on a chair when her "knee gave out" Pt reports falling. Pt reports lower back pain. Pt denies neck pain. Pt reports head injury, no LOC, No blood thinners.

## 2017-05-09 DIAGNOSIS — I10 Essential (primary) hypertension: Secondary | ICD-10-CM | POA: Diagnosis not present

## 2017-05-09 DIAGNOSIS — W19XXXA Unspecified fall, initial encounter: Secondary | ICD-10-CM | POA: Diagnosis not present

## 2017-05-09 DIAGNOSIS — G44311 Acute post-traumatic headache, intractable: Secondary | ICD-10-CM | POA: Diagnosis not present

## 2017-05-09 DIAGNOSIS — E039 Hypothyroidism, unspecified: Secondary | ICD-10-CM | POA: Diagnosis not present

## 2017-05-09 DIAGNOSIS — D72829 Elevated white blood cell count, unspecified: Secondary | ICD-10-CM | POA: Diagnosis not present

## 2017-05-09 DIAGNOSIS — S32010A Wedge compression fracture of first lumbar vertebra, initial encounter for closed fracture: Secondary | ICD-10-CM

## 2017-05-09 DIAGNOSIS — R52 Pain, unspecified: Secondary | ICD-10-CM | POA: Diagnosis not present

## 2017-05-09 DIAGNOSIS — W07XXXA Fall from chair, initial encounter: Secondary | ICD-10-CM | POA: Diagnosis not present

## 2017-05-09 DIAGNOSIS — S32000A Wedge compression fracture of unspecified lumbar vertebra, initial encounter for closed fracture: Secondary | ICD-10-CM | POA: Diagnosis not present

## 2017-05-09 LAB — BASIC METABOLIC PANEL
Anion gap: 11 (ref 5–15)
BUN: 15 mg/dL (ref 6–20)
CO2: 25 mmol/L (ref 22–32)
Calcium: 8.9 mg/dL (ref 8.9–10.3)
Chloride: 102 mmol/L (ref 101–111)
Creatinine, Ser: 1.02 mg/dL — ABNORMAL HIGH (ref 0.44–1.00)
GFR calc Af Amer: 60 mL/min — ABNORMAL LOW (ref >60)
GFR calc non Af Amer: 52 mL/min — ABNORMAL LOW (ref >60)
Glucose, Bld: 111 mg/dL — ABNORMAL HIGH (ref 65–99)
Potassium: 3.7 mmol/L (ref 3.5–5.1)
Sodium: 138 mmol/L (ref 135–145)

## 2017-05-09 LAB — CBC
HCT: 38.1 % (ref 36.0–46.0)
Hemoglobin: 12.2 g/dL (ref 12.0–15.0)
MCH: 29.3 pg (ref 26.0–34.0)
MCHC: 32 g/dL (ref 30.0–36.0)
MCV: 91.4 fL (ref 78.0–100.0)
Platelets: 220 10*3/uL (ref 150–400)
RBC: 4.17 MIL/uL (ref 3.87–5.11)
RDW: 14.1 % (ref 11.5–15.5)
WBC: 8.7 10*3/uL (ref 4.0–10.5)

## 2017-05-09 MED ORDER — MORPHINE SULFATE (PF) 4 MG/ML IV SOLN
2.0000 mg | INTRAVENOUS | Status: DC | PRN
  Administered 2017-05-10 (×3): 2 mg via INTRAVENOUS
  Filled 2017-05-09 (×3): qty 1

## 2017-05-09 MED ORDER — METOCLOPRAMIDE HCL 5 MG/ML IJ SOLN
5.0000 mg | Freq: Once | INTRAMUSCULAR | Status: AC
  Administered 2017-05-09: 5 mg via INTRAVENOUS
  Filled 2017-05-09: qty 2

## 2017-05-09 MED ORDER — SODIUM CHLORIDE 0.9 % IV SOLN
INTRAVENOUS | Status: DC
  Administered 2017-05-09 – 2017-05-11 (×4): via INTRAVENOUS

## 2017-05-09 MED ORDER — DIPHENHYDRAMINE HCL 50 MG/ML IJ SOLN
12.5000 mg | Freq: Once | INTRAMUSCULAR | Status: AC
  Administered 2017-05-09: 12.5 mg via INTRAVENOUS
  Filled 2017-05-09: qty 1

## 2017-05-09 MED ORDER — NALOXONE HCL 0.4 MG/ML IJ SOLN: 0.4000 mg | INTRAMUSCULAR | Status: DC | PRN

## 2017-05-09 MED ORDER — KETOROLAC TROMETHAMINE 15 MG/ML IJ SOLN
15.0000 mg | Freq: Once | INTRAMUSCULAR | Status: AC
  Administered 2017-05-09: 15 mg via INTRAVENOUS
  Filled 2017-05-09: qty 1

## 2017-05-09 NOTE — Progress Notes (Addendum)
PROGRESS NOTE  [name] VOZ:[id number] DOB: April 29, 1940 DOA: 05/08/2017 PCP: Jonathon Resides, MD  HPI/Recap of past 24 hours: [name] is a 77 y.o. female with medical history significant of HTN, HLD, hypothyroidism, MCNS, COPD, and GERD; who presents after having a fall while standing on a chair trying to fix the plate on top of a shelf in her kitchen.  She reports falling onto her buttocks and then hitting her head against the dishwasher.  Denies having any loss of consciousness.  Thereafter reports having sharp pain in her lower back worsened by any movement, but did not radiate down her spine.  Unable to get any relief prior to arrival.  Patient otherwise been in her normal state of health prior to the incident.  Denies having any focal weakness, saddle anesthesia, difficulty urinating, fever, chills, or any other complaints.  05/09/2017: Patient seen and examined at her bedside.  She reports her pain is well controlled on current pain management.  Pain is worse when she moves.  Per medical record, neurosurgery has been contacted.  Spoke with the patient's son over the phone and updated him on current medical condition. Neurosurgery paged.  UPDATE: Spoke with Dr. Arnoldo Morale on the phone who stated the patient is a poor surgical candidate. Dr Arnoldo Morale reviewed her imagings and stated he has no plans for any surgical intervention. He recommends to continue TLSO brace and to follow up outpatient.    Assessment/Plan: Principal Problem:   Closed posttraumatic compression fracture of lumbar vertebra (HCC) Active Problems:   Hypothyroidism   Essential hypertension, benign   Accidental fall from chair, initial encounter   Leukocytosis   Close posttraumatic compression fracture of L1 on L2 Back brace in place Neurosurgery consulted will see outpatient Continue pain management Currently on OxyIR 5 mg every 4 hours as needed for moderate pain and morphine 2 mg IV every 3 hours as  needed for severe pain Narcan as needed for signs of overdose Closely monitor on telemetry PT to evaluate and treat tomorrow Continue TLSO brace  Hypertension Blood pressure is stable Continue home medication losartan  CKD 3 GFR 52 Creatinine 1.02 Avoid nephrotoxic agents/hypotension Repeat BMP in the morning  Depression/anxiety Continue Celexa  GERD Continue Protonix p.o. twice daily  Hypothyroidism Continue level thyroxine  Obesity BMI 31 Weight loss outpatient    Code Status: Full  Family Communication: son at bedside  Disposition Plan: SNF for short term rehab   Consultants:  Neurosurgery  Procedures:  None  Antimicrobials:  None  DVT prophylaxis:  SCds, sq lovenox daily   Objective: Vitals:   05/09/17 0500 05/09/17 0824 05/09/17 1327 05/09/17 1336  BP: (!) 144/63   (!) 149/77  Pulse: 82   82  Resp: 18   18  Temp: 100.1 F (37.8 C)  99.1 F (37.3 C)   TempSrc: Oral  Oral   SpO2: 90% 90%    Weight:      Height:        Intake/Output Summary (Last 24 hours) at 05/09/2017 1337 Last data filed at 05/08/2017 2238 Gross per 24 hour  Intake -  Output 600 ml  Net -600 ml   Filed Weights   05/08/17 1358 05/08/17 1724  Weight: 88.9 kg (196 lb) 88.9 kg (196 lb)    Exam:   General: 77 year old Caucasian female well-developed well-nourished in no acute distress.  Alert and oriented x3.  Has a back brace in place.  Cardiovascular: Regular rate and rhythm with no  rubs or gallops.  No JVD or thyromegaly noted.  Respiratory: Clear to auscultation with no wheezes or rales.  Abdomen: Soft nontender nondistended normal bowel sounds x4 quadrant.  Musculoskeletal: 2 out of 4 pulses in all 4 extremities.  Skin: No ulcerative lesions noted.  Psychiatry:Mood is appropriate for condition and setting.   Data Reviewed: CBC: Recent Labs  Lab 05/08/17 1713 05/09/17 0457  WBC 13.7* 8.7  NEUTROABS 11.5*  --   HGB 12.6 12.2  HCT 39.3 38.1    MCV 90.8 91.4  PLT 257 562   Basic Metabolic Panel: Recent Labs  Lab 05/08/17 1713 05/09/17 0457  NA 138 138  K 4.1 3.7  CL 101 102  CO2 28 25  GLUCOSE 117* 111*  BUN 16 15  CREATININE 1.03* 1.02*  CALCIUM 9.3 8.9   GFR: Estimated Creatinine Clearance: 51.8 mL/min (A) (by C-G formula based on SCr of 1.02 mg/dL (H)). Liver Function Tests: No results for input(s): AST, ALT, ALKPHOS, BILITOT, PROT, ALBUMIN in the last 168 hours. No results for input(s): LIPASE, AMYLASE in the last 168 hours. No results for input(s): AMMONIA in the last 168 hours. Coagulation Profile: No results for input(s): INR, PROTIME in the last 168 hours. Cardiac Enzymes: No results for input(s): CKTOTAL, CKMB, CKMBINDEX, TROPONINI in the last 168 hours. BNP (last 3 results) No results for input(s): PROBNP in the last 8760 hours. HbA1C: No results for input(s): HGBA1C in the last 72 hours. CBG: No results for input(s): GLUCAP in the last 168 hours. Lipid Profile: No results for input(s): CHOL, HDL, LDLCALC, TRIG, CHOLHDL, LDLDIRECT in the last 72 hours. Thyroid Function Tests: No results for input(s): TSH, T4TOTAL, FREET4, T3FREE, THYROIDAB in the last 72 hours. Anemia Panel: No results for input(s): VITAMINB12, FOLATE, FERRITIN, TIBC, IRON, RETICCTPCT in the last 72 hours. Urine analysis:    Component Value Date/Time   COLORURINE YELLOW 07/04/2014 [place] 07/04/2014 1317   LABSPEC 1.011 07/04/2014 1317   PHURINE 6.5 07/04/2014 1317   GLUCOSEU NEGATIVE 07/04/2014 1317   HGBUR NEGATIVE 07/04/2014 1317   [place] 07/04/2014 1317   BILIRUBINUR Neg 01/12/2013 1604   KETONESUR NEGATIVE 07/04/2014 1317   PROTEINUR NEGATIVE 07/04/2014 1317   UROBILINOGEN 0.2 07/04/2014 1317   NITRITE NEGATIVE 07/04/2014 1317   LEUKOCYTESUR NEGATIVE 07/04/2014 1317   Sepsis Labs: @LABRCNTIP (procalcitonin:4,lacticidven:4)  )No results found for this or any previous visit (from the  past 240 hour(s)).    Studies: Dg Lumbar Spine Complete  Result Date: 05/08/2017 CLINICAL DATA:  Low back and left leg pain after fall. EXAM: LUMBAR SPINE - COMPLETE 4+ VIEW COMPARISON:  None. FINDINGS: Moderate compression deformity of L1 vertebral body is noted consistent with acute fracture. No spondylolisthesis is noted. Moderate degenerative disc disease is noted at L4-5 and L5-S1. IMPRESSION: Probable acute compression fracture of L1 vertebral body. CT scan may be performed for further evaluation. Electronically Signed   By: Marijo Conception, M.D.   On: 05/08/2017 15:29   Dg Tibia/fibula Left  Result Date: 05/08/2017 CLINICAL DATA:  Fall.  Swelling about anterior tibia. EXAM: LEFT TIBIA AND FIBULA - 2 VIEW COMPARISON:  None. FINDINGS: Left medial hemiarthroplasty. Osteopenia. No acute fracture or dislocation. Moderate degenerate changes of the patellofemoral articulation of the knee. No knee joint effusion. Small Achilles and calcaneal spurs. IMPRESSION: Postoperative and degenerative changes.  No acute findings. Electronically Signed   By: Abigail Miyamoto M.D.   On: 05/08/2017 15:26   Ct Head Wo Contrast  Result Date: 05/08/2017 CLINICAL DATA:  Fall.  Head injury. EXAM: CT HEAD WITHOUT CONTRAST TECHNIQUE: Contiguous axial images were obtained from the base of the skull through the vertex without intravenous contrast. COMPARISON:  None. FINDINGS: Brain: cerebral atrophy with resultant prominence of the extra-axial spaces adjacent the frontal lobes. No mass lesion, hemorrhage, hydrocephalus, acute infarct, intra-axial, or extra-axial fluid collection. Vascular: Intracranial atherosclerosis. Skull: Hyperostosis frontalis interna. No displaced fractures or significant soft tissue swelling. Sinuses/Orbits: Normal imaged portions of the orbits and globes. Clear paranasal sinuses and mastoid air cells. Other: None. IMPRESSION: 1.  No acute intracranial abnormality. 2. Cerebral atrophy. Electronically  Signed   By: Abigail Miyamoto M.D.   On: 05/08/2017 15:29   Ct Lumbar Spine Wo Contrast  Result Date: 05/08/2017 CLINICAL DATA:  Low back pain status post fall. EXAM: CT LUMBAR SPINE WITHOUT CONTRAST TECHNIQUE: Multidetector CT imaging of the lumbar spine was performed without intravenous contrast administration. Multiplanar CT image reconstructions were also generated. COMPARISON:  None. FINDINGS: Segmentation: 5 lumbar type vertebrae. Alignment: Normal. Vertebrae: L1 vertebral body compression fracture with approximately 50% height loss and 6 mm retropulsion of the superior posterior margin of the L1 vertebral body mildly impressing on the spinal canal resulting in mild spinal stenosis. L2 vertebral body compression fracture without significant height loss and 3 mm of retropulsion of the superior posterior margin of the L2 vertebral body without significant impression on the thecal sac. Remainder the vertebral body heights are maintained. No aggressive osseous lesion. Paraspinal and other soft tissues: No acute paraspinal abnormality. Abdominal aortic atherosclerosis. Disc levels: Degenerative disc disease with disc height loss at L5-S1 and to lesser extent L4-5. moderate bilateral facet arthropathy at L1-2. Broad-based disc bulge at L2-3 with mild bilateral facet arthropathy. Mild broad-based disc bulge and bilateral facet arthropathy at L3-4 and L4-5. IMPRESSION: 1. Acute L1 vertebral body compression fracture with approximately 50% height loss and 6 mm of retropulsion of the superior posterior margin of the L1 vertebral body mildly impressing on the spinal canal. 2. Acute L2 vertebral body compression fracture without significant height loss and 3 mm of retropulsion of the superior posterior margin of the L2 vertebral body. Electronically Signed   By: Kathreen Devoid   On: 05/08/2017 16:36    Scheduled Meds: . citalopram  20 mg Oral Daily  . enoxaparin (LOVENOX) injection  40 mg Subcutaneous QHS  .  levothyroxine  88 mcg Oral QAC breakfast  . losartan  50 mg Oral Daily  . mometasone-formoterol  2 puff Inhalation Daily  . montelukast  10 mg Oral QHS  . pantoprazole  40 mg Oral BID  . rosuvastatin  20 mg Oral Daily  . tiotropium  18 mcg Inhalation Daily    Continuous Infusions:   LOS: 0 days     Kayleen Memos, MD [facility] Pager [phone]  If 7PM-7AM, please contact night-coverage [email or web address] Password [facility] 05/09/2017, 1:37 PM

## 2017-05-09 NOTE — Progress Notes (Signed)
Pt transferred to room 1407 from 5W. Tele applied, family at bedside. Pain under control at this time. Brace on and intact. Pt A&O, VSS. Agree with previous assessment.

## 2017-05-09 NOTE — Progress Notes (Signed)
Nurse called report to 4th floor RN. Pt and family aware of pt's transfer to 4th floor for cardiac monitoring.

## 2017-05-10 DIAGNOSIS — W19XXXA Unspecified fall, initial encounter: Secondary | ICD-10-CM | POA: Diagnosis not present

## 2017-05-10 DIAGNOSIS — S32000A Wedge compression fracture of unspecified lumbar vertebra, initial encounter for closed fracture: Secondary | ICD-10-CM | POA: Diagnosis not present

## 2017-05-10 DIAGNOSIS — G44311 Acute post-traumatic headache, intractable: Secondary | ICD-10-CM

## 2017-05-10 DIAGNOSIS — R52 Pain, unspecified: Secondary | ICD-10-CM

## 2017-05-10 DIAGNOSIS — W07XXXA Fall from chair, initial encounter: Secondary | ICD-10-CM | POA: Diagnosis not present

## 2017-05-10 DIAGNOSIS — I10 Essential (primary) hypertension: Secondary | ICD-10-CM | POA: Diagnosis not present

## 2017-05-10 LAB — CBC
HCT: 38.6 % (ref 36.0–46.0)
Hemoglobin: 12.2 g/dL (ref 12.0–15.0)
MCH: 29.5 pg (ref 26.0–34.0)
MCHC: 31.6 g/dL (ref 30.0–36.0)
MCV: 93.5 fL (ref 78.0–100.0)
Platelets: 204 10*3/uL (ref 150–400)
RBC: 4.13 MIL/uL (ref 3.87–5.11)
RDW: 14 % (ref 11.5–15.5)
WBC: 11.2 10*3/uL — ABNORMAL HIGH (ref 4.0–10.5)

## 2017-05-10 MED ORDER — DIPHENHYDRAMINE HCL 25 MG PO CAPS
25.0000 mg | ORAL_CAPSULE | Freq: Every evening | ORAL | Status: DC | PRN
  Administered 2017-05-10: 25 mg via ORAL
  Filled 2017-05-10: qty 1

## 2017-05-10 MED ORDER — DOCUSATE SODIUM 100 MG PO CAPS
200.0000 mg | ORAL_CAPSULE | Freq: Once | ORAL | Status: AC
  Administered 2017-05-10: 200 mg via ORAL
  Filled 2017-05-10: qty 2

## 2017-05-10 MED ORDER — DOCUSATE SODIUM 100 MG PO CAPS: 100.0000 mg | ORAL_CAPSULE | Freq: Two times a day (BID) | ORAL | Status: DC

## 2017-05-10 NOTE — Progress Notes (Signed)
PROGRESS NOTE  [name] UUV:[id number] DOB: [birth year] DOA: 05/08/2017 PCP: Jonathon Resides, MD  HPI/Recap of past 24 hours: [name] is a 77 y.o. female with medical history significant of HTN, HLD, hypothyroidism, MCNS, COPD, and GERD; who presents after having a fall while standing on a chair trying to fix the plate on top of a shelf in her kitchen.  She reports falling onto her buttocks and then hitting her head against the dishwasher.  Denies having any loss of consciousness.  Thereafter reports having sharp pain in her lower back worsened by any movement, but did not radiate down her spine.  Unable to get any relief prior to arrival.  Patient otherwise been in her normal state of health prior to the incident.  Denies having any focal weakness, saddle anesthesia, difficulty urinating, fever, chills, or any other complaints.  05/09/2017: Patient seen and examined at her bedside.  She reports her pain is well controlled on current pain management.  Pain is worse when she moves.  Per medical record, neurosurgery has been contacted.  Spoke with the patient's son over the phone and updated him on current medical condition. Neurosurgery paged.  UPDATE: Spoke with Dr. Arnoldo Morale on the phone who stated the patient is a poor surgical candidate. Dr Arnoldo Morale reviewed her imagings and stated he has no plans for any surgical intervention. He recommends to continue TLSO brace and to follow up outpatient.  05/10/17: seen and examined at her beside. Reports pain is well controlled particularly when she does not move. Denies chest pain or dyspnea.    Assessment/Plan: Principal Problem:   Closed posttraumatic compression fracture of lumbar vertebra (HCC) Active Problems:   Hypothyroidism   Essential hypertension, benign   Accidental fall from chair, initial encounter   Leukocytosis   Close posttraumatic compression fracture of L1 on L2 Back brace in place Neurosurgery consulted will see  outpatient Continue pain management Currently on OxyIR 5 mg every 4 hours as needed for moderate pain and morphine 2 mg IV every 3 hours as needed for severe pain Narcan as needed for signs of overdose Closely monitor on telemetry PT to evaluate and treat  PT recommends SNF Continue TLSO brace  Ambulatory dysfunction 2/2 to lumbar fracture Will need SNF for short term rehab Social worker consulted  Hypertension Blood pressure is stable Continue home medication losartan  CKD 3 GFR 52 Creatinine 1.02 Avoid nephrotoxic agents/hypotension Repeat BMP in the morning  Depression/anxiety Continue Celexa  GERD Continue Protonix p.o. twice daily  Hypothyroidism Continue level thyroxine  Obesity BMI 31 Weight loss outpatient    Code Status: Full  Family Communication: son at bedside  Disposition Plan: SNF for short term rehab   Consultants:  Neurosurgery  Procedures:  None  Antimicrobials:  None  DVT prophylaxis:  SCds, sq lovenox daily   Objective: Vitals:   05/09/17 1547 05/09/17 1548 05/09/17 2050 05/10/17 0534  BP: 136/71  125/69 (!) 151/70  Pulse: 78  70 82  Resp: 18  16 18   Temp: 99.1 F (37.3 C)  98.5 F (36.9 C) 98.6 F (37 C)  TempSrc: Oral  Oral Oral  SpO2: 90% 93% 98% 96%  Weight:  93.6 kg (206 lb 5.6 oz)    Height:        Intake/Output Summary (Last 24 hours) at 05/10/2017 1354 Last data filed at 05/10/2017 0600 Gross per 24 hour  Intake 1128.75 ml  Output -  Net 1128.75 ml   [employer]  05/08/17 1358 05/08/17 1724 05/09/17 1548  Weight: 88.9 kg (196 lb) 88.9 kg (196 lb) 93.6 kg (206 lb 5.6 oz)    Exam: 05/10/17   General: 80 CF obese  NAD A&O x 3.  Cardiovascular: RRR no rubs or gallops. No JVD or thyromegaly  Respiratory: Clear to auscultation with no wheezes or rales.  Abdomen: Soft nontender nondistended normal bowel sounds x4 quadrant.  Musculoskeletal: 2 out of 4 pulses in all 4 extremities.  Skin: No  ulcerative lesions noted.  Psychiatry:Mood is appropriate for condition and setting.   Data Reviewed: CBC: Recent Labs  Lab 05/08/17 1713 05/09/17 0457 05/10/17 0531  WBC 13.7* 8.7 11.2*  NEUTROABS 11.5*  --   --   HGB 12.6 12.2 12.2  HCT 39.3 38.1 38.6  MCV 90.8 91.4 93.5  PLT 257 220 706   Basic Metabolic Panel: Recent Labs  Lab 05/08/17 1713 05/09/17 0457  NA 138 138  K 4.1 3.7  CL 101 102  CO2 28 25  GLUCOSE 117* 111*  BUN 16 15  CREATININE 1.03* 1.02*  CALCIUM 9.3 8.9   GFR: Estimated Creatinine Clearance: 53.2 mL/min (A) (by C-G formula based on SCr of 1.02 mg/dL (H)). Liver Function Tests: No results for input(s): AST, ALT, ALKPHOS, BILITOT, PROT, ALBUMIN in the last 168 hours. No results for input(s): LIPASE, AMYLASE in the last 168 hours. No results for input(s): AMMONIA in the last 168 hours. Coagulation Profile: No results for input(s): INR, PROTIME in the last 168 hours. Cardiac Enzymes: No results for input(s): CKTOTAL, CKMB, CKMBINDEX, TROPONINI in the last 168 hours. BNP (last 3 results) No results for input(s): PROBNP in the last 8760 hours. HbA1C: No results for input(s): HGBA1C in the last 72 hours. CBG: No results for input(s): GLUCAP in the last 168 hours. Lipid Profile: No results for input(s): CHOL, HDL, LDLCALC, TRIG, CHOLHDL, LDLDIRECT in the last 72 hours. Thyroid Function Tests: No results for input(s): TSH, T4TOTAL, FREET4, T3FREE, THYROIDAB in the last 72 hours. Anemia Panel: No results for input(s): VITAMINB12, FOLATE, FERRITIN, TIBC, IRON, RETICCTPCT in the last 72 hours. Urine analysis:    Component Value Date/Time   COLORURINE YELLOW 07/04/2014 [place] 07/04/2014 1317   LABSPEC 1.011 07/04/2014 1317   PHURINE 6.5 07/04/2014 1317   GLUCOSEU NEGATIVE 07/04/2014 1317   HGBUR NEGATIVE 07/04/2014 1317   [place] 07/04/2014 1317   BILIRUBINUR Neg 01/12/2013 1604   KETONESUR NEGATIVE 07/04/2014  1317   PROTEINUR NEGATIVE 07/04/2014 1317   UROBILINOGEN 0.2 07/04/2014 1317   NITRITE NEGATIVE 07/04/2014 1317   LEUKOCYTESUR NEGATIVE 07/04/2014 1317   Sepsis Labs: @LABRCNTIP (procalcitonin:4,lacticidven:4)  )No results found for this or any previous visit (from the past 240 hour(s)).    Studies: No results found.  Scheduled Meds: . citalopram  20 mg Oral Daily  . enoxaparin (LOVENOX) injection  40 mg Subcutaneous QHS  . levothyroxine  88 mcg Oral QAC breakfast  . losartan  50 mg Oral Daily  . mometasone-formoterol  2 puff Inhalation Daily  . montelukast  10 mg Oral QHS  . pantoprazole  40 mg Oral BID  . rosuvastatin  20 mg Oral Daily  . tiotropium  18 mcg Inhalation Daily    Continuous Infusions: . sodium chloride 75 mL/hr at 05/10/17 0139     LOS: 0 days     Kayleen Memos, MD [facility] Pager [phone]  If 7PM-7AM, please contact night-coverage [email or web address] Password [email or web address] 05/10/2017, 1:54 PM

## 2017-05-10 NOTE — Evaluation (Signed)
Occupational Therapy Evaluation Patient Details Name: [name] MRN: [id number] DOB: 03-06-1940 Today's Date: 05/10/2017    History of Present Illness 77 y.o. female with medical history significant of HTN, HLD, hypothyroidism, MCNS, COPD, and GERD; who presents after having a fall and found to have sustained acute burst fracture of L1 vertebral body and compression fracture of L2.  Plan for nonsurgical treatment and requires TLSO brace   Clinical Impression   Pt admitted s/p fall. Pt currently with functional imitations due to the deficits listed below (see OT Problem List).  Pt will benefit from skilled OT to increase their safety and independence with ADL and functional mobility for ADL to facilitate discharge to venue listed below.      Follow Up Recommendations  SNF    Equipment Recommendations  None recommended by OT    Recommendations for Other Services       Precautions / Restrictions Precautions Precautions: Back Required Braces or Orthoses: Spinal Brace Spinal Brace: Thoracolumbosacral orthotic      Mobility Bed Mobility Overal bed mobility: Needs Assistance Bed Mobility: Rolling;Sidelying to Sit Rolling: Min guard Sidelying to sit: Min guard       General bed mobility comments: verbal cues for log roll technique  Transfers Overall transfer level: Needs assistance Equipment used: Rolling walker (2 wheeled) Transfers: Sit to/from Stand Sit to Stand: Min assist Stand pivot transfers: Min assist       General transfer comment: verbal cues for precautions, assist to rise, cues for upright posture, pt reports pain increased and declines ambulating, agreeable to pivot to recliner    Balance Overall balance assessment: History of Falls                                         ADL either performed or assessed with clinical judgement   ADL Overall ADL's : Needs assistance/impaired Eating/Feeding: Set up;Bed level   Grooming: Minimal  assistance;Sitting           Upper Body Dressing : Maximal assistance;Sitting;Cueing for safety;Cueing for sequencing Upper Body Dressing Details (indicate cue type and reason): donning brace Lower Body Dressing: Total assistance;Sit to/from stand;Cueing for safety;Cueing for sequencing;Cueing for back precautions                 General ADL Comments: pt with many questions regarding brace.  pt in bed with brace.  encouraged pt to remove brace in bed. Pt agreed.                  Pertinent Vitals/Pain Pain Assessment: 0-10 Pain Score: 6  Pain Location: back with movement Pain Descriptors / Indicators: Aching;Sore Pain Intervention(s): Repositioned;Limited activity within patient's tolerance;Monitored during session;Premedicated before session     Hand Dominance     Extremity/Trunk Assessment Upper Extremity Assessment Upper Extremity Assessment: Generalized weakness   Lower Extremity Assessment Lower Extremity Assessment: Generalized weakness       Communication Communication Communication: No difficulties   Cognition Arousal/Alertness: Awake/alert Behavior During Therapy: WFL for tasks assessed/performed Overall Cognitive Status: Within Functional Limits for tasks assessed                                                Home Living Family/patient expects to be discharged to:: Private residence  Living Arrangements: Alone   Type of Home: House Home Access: Stairs to enter Technical brewer of Steps: 3   Home Layout: One level               Home Equipment: None          Prior Functioning/Environment Level of Independence: Independent                 OT Problem List: Decreased strength;Decreased activity tolerance;Decreased safety awareness;Decreased knowledge of precautions;Decreased knowledge of use of DME or AE;Pain      OT Treatment/Interventions: Self-care/ADL training;Patient/family education;DME and/or AE  instruction    OT Goals(Current goals can be found in the care plan section) Acute Rehab OT Goals Patient Stated Goal: get well OT Goal Formulation: With patient Time For Goal Achievement: 05/17/17 Potential to Achieve Goals: Good  OT Frequency: Min 2X/week   Barriers to D/C: Decreased caregiver support          Co-evaluation              AM-PAC PT "6 Clicks" Daily Activity     Outcome Measure Help from another person eating meals?: None Help from another person taking care of personal grooming?: A Little Help from another person toileting, which includes using toliet, bedpan, or urinal?: A Lot Help from another person bathing (including washing, rinsing, drying)?: A Lot Help from another person to put on and taking off regular upper body clothing?: A Lot Help from another person to put on and taking off regular lower body clothing?: Total 6 Click Score: 14   End of Session Nurse Communication: Mobility status  Activity Tolerance: Patient tolerated treatment well Patient left: in bed  OT Visit Diagnosis: Unsteadiness on feet (R26.81);Repeated falls (R29.6);Muscle weakness (generalized) (M62.81);Pain                Time: 3267-1245 OT Time Calculation (min): 15 min Charges:  OT General Charges $OT Visit: 1 Visit OT Evaluation $OT Eval Moderate Complexity: 1 Mod G-Codes:     Kari Baars, OT [phone]  Payton Mccallum D 05/10/2017, 3:04 PM

## 2017-05-10 NOTE — Care Management Obs Status (Signed)
[facility] NOTIFICATION   Patient Details  Name: [name] MRN: [id number] Date of Birth: [birth year]   Medicare Observation Status Notification Given:  Yes    MahabirJuliann Pulse, RN 05/10/2017, 4:20 PM

## 2017-05-10 NOTE — Evaluation (Signed)
Physical Therapy Evaluation Patient Details Name: [name] MRN: [id number] DOB: 1940-09-03 Today's Date: 05/10/2017   History of Present Illness  77 y.o. female with medical history significant of HTN, HLD, hypothyroidism, MCNS, COPD, and GERD; who presents after having a fall and found to have sustained acute burst fracture of L1 vertebral body and compression fracture of L2.  Plan for nonsurgical treatment and requires TLSO brace  Clinical Impression  Pt admitted with above diagnosis. Pt currently with functional limitations due to the deficits listed below (see PT Problem List).  Pt will benefit from skilled PT to increase their independence and safety with mobility to allow discharge to the venue listed below.   Pt premedicated for mobility and reports increased pain with transitional movements.  Pt declined ambulating today due to pain however agreeable for OOB to recliner.  Pt lives alone and reports she plans for SNF upon d/c.     Follow Up Recommendations SNF    Equipment Recommendations  Rolling walker with 5" wheels    Recommendations for Other Services       Precautions / Restrictions Precautions Precautions: Back Required Braces or Orthoses: Spinal Brace Spinal Brace: Thoracolumbosacral orthotic Restrictions Weight Bearing Restrictions: No      Mobility  Bed Mobility Overal bed mobility: Needs Assistance Bed Mobility: Rolling;Sidelying to Sit Rolling: Min guard Sidelying to sit: Min guard       General bed mobility comments: verbal cues for log roll technique, pt already wearing TLSO (sleep in last night), increased time and effort  Transfers Overall transfer level: Needs assistance Equipment used: Rolling walker (2 wheeled) Transfers: Sit to/from [facility] Sit to Stand: Min assist Stand pivot transfers: Min assist       General transfer comment: verbal cues for precautions, assist to rise, cues for upright posture, pt reports  pain increased and declines ambulating, agreeable to pivot to recliner  Ambulation/Gait                Stairs            Wheelchair Mobility    Modified Rankin (Stroke Patients Only)       Balance Overall balance assessment: History of Falls                                           Pertinent Vitals/Pain Pain Assessment: 0-10 Pain Score: 7  Pain Location: back with movement Pain Descriptors / Indicators: Aching;Sore Pain Intervention(s): Repositioned;Limited activity within patient's tolerance;Monitored during session;Premedicated before session    [place] expects to be discharged to:: Private residence Living Arrangements: Alone   Type of Home: House Home Access: Stairs to enter   Technical brewer of Steps: 3 Home Layout: One level Home Equipment: None      Prior Function Level of Independence: Independent               Hand Dominance        Extremity/Trunk Assessment        Lower Extremity Assessment Lower Extremity Assessment: Generalized weakness       Communication   Communication: No difficulties  Cognition Arousal/Alertness: Awake/alert Behavior During Therapy: WFL for tasks assessed/performed Overall Cognitive Status: Within Functional Limits for tasks assessed  General Comments      Exercises     Assessment/Plan    PT Assessment Patient needs continued PT services  PT Problem List Decreased strength;Decreased mobility;Decreased balance;Decreased knowledge of use of DME;Decreased range of motion;Pain       PT Treatment Interventions DME instruction;Therapeutic activities;Therapeutic exercise;Balance training;Gait training;Patient/family education;Functional mobility training    PT Goals (Current goals can be found in the Care Plan section)  Acute Rehab PT Goals PT Goal Formulation: With patient Time For Goal  Achievement: 05/24/17 Potential to Achieve Goals: Good    Frequency Min 3X/week   Barriers to discharge        Co-evaluation               AM-PAC PT "6 Clicks" Daily Activity  Outcome Measure Difficulty turning over in bed (including adjusting bedclothes, sheets and blankets)?: A Little Difficulty moving from lying on back to sitting on the side of the bed? : Unable Difficulty sitting down on and standing up from a chair with arms (e.g., wheelchair, bedside commode, etc,.)?: Unable Help needed moving to and from a bed to chair (including a wheelchair)?: A Little Help needed walking in hospital room?: Total Help needed climbing 3-5 steps with a railing? : Total 6 Click Score: 10    End of Session Equipment Utilized During Treatment: Back brace Activity Tolerance: Patient limited by pain Patient left: in chair;with call bell/phone within reach Nurse Communication: Mobility status PT Visit Diagnosis: Other abnormalities of gait and mobility (R26.89)    Time: 8416-6063 PT Time Calculation (min) (ACUTE ONLY): 10 min   Charges:   PT Evaluation $PT Eval Low Complexity: 1 Low     PT G CodesCarmelia Bake, PT, DPT 05/10/2017 Pager: [phone]  York Ram E 05/10/2017, 12:46 PM

## 2017-05-10 NOTE — Plan of Care (Signed)
  Problem: Activity: °Goal: Risk for activity intolerance will decrease °Outcome: Progressing °  °Problem: Coping: °Goal: Level of anxiety will decrease °Outcome: Progressing °  °Problem: Elimination: °Goal: Will not experience complications related to bowel motility °Outcome: Progressing °Goal: Will not experience complications related to urinary retention °Outcome: Progressing °  °Problem: Pain Managment: °Goal: General experience of comfort will improve °Outcome: Progressing °  °Problem: Skin Integrity: °Goal: Risk for impaired skin integrity will decrease °Outcome: Progressing °  °

## 2017-05-11 DIAGNOSIS — I10 Essential (primary) hypertension: Secondary | ICD-10-CM | POA: Diagnosis not present

## 2017-05-11 DIAGNOSIS — W19XXXA Unspecified fall, initial encounter: Secondary | ICD-10-CM | POA: Diagnosis not present

## 2017-05-11 DIAGNOSIS — S32000A Wedge compression fracture of unspecified lumbar vertebra, initial encounter for closed fracture: Secondary | ICD-10-CM | POA: Diagnosis not present

## 2017-05-11 DIAGNOSIS — W07XXXA Fall from chair, initial encounter: Secondary | ICD-10-CM | POA: Diagnosis not present

## 2017-05-11 LAB — BASIC METABOLIC PANEL
Anion gap: 6 (ref 5–15)
BUN: 14 mg/dL (ref 6–20)
CO2: 27 mmol/L (ref 22–32)
Calcium: 7.9 mg/dL — ABNORMAL LOW (ref 8.9–10.3)
Chloride: 107 mmol/L (ref 101–111)
Creatinine, Ser: 0.94 mg/dL (ref 0.44–1.00)
GFR calc Af Amer: >60 mL/min (ref >60)
GFR calc non Af Amer: 57 mL/min — ABNORMAL LOW (ref >60)
Glucose, Bld: 116 mg/dL — ABNORMAL HIGH (ref 65–99)
Potassium: 3.8 mmol/L (ref 3.5–5.1)
Sodium: 140 mmol/L (ref 135–145)

## 2017-05-11 LAB — CBC
HCT: 33.3 % — ABNORMAL LOW (ref 36.0–46.0)
Hemoglobin: 10.7 g/dL — ABNORMAL LOW (ref 12.0–15.0)
MCH: 29.8 pg (ref 26.0–34.0)
MCHC: 32.1 g/dL (ref 30.0–36.0)
MCV: 92.8 fL (ref 78.0–100.0)
Platelets: 172 10*3/uL (ref 150–400)
RBC: 3.59 MIL/uL — ABNORMAL LOW (ref 3.87–5.11)
RDW: 13.8 % (ref 11.5–15.5)
WBC: 8.3 10*3/uL (ref 4.0–10.5)

## 2017-05-11 MED ORDER — BISACODYL 5 MG PO TBEC: 5.0000 mg | DELAYED_RELEASE_TABLET | Freq: Every day | ORAL | Status: DC | PRN

## 2017-05-11 MED ORDER — LOSARTAN POTASSIUM 50 MG PO TABS
100.0000 mg | ORAL_TABLET | Freq: Every day | ORAL | Status: DC
  Administered 2017-05-11 – 2017-05-12 (×2): 100 mg via ORAL
  Filled 2017-05-11 (×2): qty 2

## 2017-05-11 MED ORDER — SENNOSIDES-DOCUSATE SODIUM 8.6-50 MG PO TABS
1.0000 | ORAL_TABLET | Freq: Two times a day (BID) | ORAL | Status: DC
Start: 2017-05-11 — End: 2017-05-12
  Administered 2017-05-11 – 2017-05-12 (×3): 1 via ORAL
  Filled 2017-05-11 (×3): qty 1

## 2017-05-11 NOTE — Progress Notes (Signed)
PROGRESS NOTE  [name] NOM:[id number] DOB: 1940-12-13 DOA: 05/08/2017 PCP: Jonathon Resides, MD  HPI/Recap of past 24 hours: [name] is a 77 y.o. female with medical history significant of HTN, HLD, hypothyroidism, MCNS, COPD, and GERD; who presents after having a fall while standing on a chair trying to fix the plate on top of a shelf in her kitchen.  She reports falling onto her buttocks and then hitting her head against the dishwasher.  Denies having any loss of consciousness.  Thereafter reports having sharp pain in her lower back worsened by any movement, but did not radiate down her spine.  Unable to get any relief prior to arrival.  Patient otherwise been in her normal state of health prior to the incident.  Denies having any focal weakness, saddle anesthesia, difficulty urinating, fever, chills, or any other complaints.  05/09/2017: Patient seen and examined at her bedside.  She reports her pain is well controlled on current pain management.  Pain is worse when she moves.  Per medical record, neurosurgery has been contacted.  Spoke with the patient's son over the phone and updated him on current medical condition. Neurosurgery paged.  UPDATE: Spoke with Dr. Arnoldo Morale on the phone who stated the patient is a poor surgical candidate. Dr Arnoldo Morale reviewed her imagings and stated he has no plans for any surgical intervention. He recommends to continue TLSO brace and to follow up outpatient.  05/10/17: seen and examined at her beside. Reports pain is well controlled particularly when she does not move. Denies chest pain or dyspnea.  05/11/17: no new complaints. PT recommends SNF.     Assessment/Plan: Principal Problem:   Closed posttraumatic compression fracture of lumbar vertebra (HCC) Active Problems:   Hypothyroidism   Essential hypertension, benign   Accidental fall from chair, initial encounter   Leukocytosis   Close posttraumatic compression fracture of L1 on L2 Back  brace in place Neurosurgery consulted will see outpatient Continue pain management Currently on OxyIR 5 mg every 4 hours as needed for moderate pain and morphine 2 mg IV every 3 hours as needed for severe pain Narcan as needed for signs of overdose Closely monitor on telemetry PT to evaluate and treat  PT recommends SNF Continue TLSO brace  Ambulatory dysfunction 2/2 to lumbar fracture Will need SNF for short term rehab Social worker consulted  Hypertension Blood pressure is stable Continue home medication losartan  CKD 3 GFR 52 Creatinine 1.02 Avoid nephrotoxic agents/hypotension Repeat BMP in the morning  Depression/anxiety Continue Celexa  GERD Continue Protonix p.o. twice daily  Hypothyroidism Continue level thyroxine  Obesity BMI 31 Weight loss outpatient    Code Status: Full  Family Communication: son at bedside  Disposition Plan: SNF for short term rehab   Consultants:  Neurosurgery  Procedures:  None  Antimicrobials:  None  DVT prophylaxis:  SCds, sq lovenox daily   Objective: Vitals:   05/10/17 2125 05/11/17 0606 05/11/17 0716 05/11/17 1241  BP: (!) 152/70 (!) 159/66  (!) 142/54  Pulse: 75 76  79  Resp: 18 16  18   Temp: 99.4 F (37.4 C) 98.6 F (37 C)  99.3 F (37.4 C)  TempSrc: Oral Oral  Oral  SpO2: 99% 97% 96% 93%  Weight:      Height:        Intake/Output Summary (Last 24 hours) at 05/11/2017 1925 Last data filed at 05/11/2017 1242 Gross per 24 hour  Intake 1790 ml  Output 850 ml  Net  940 ml   Filed Weights   05/08/17 1358 05/08/17 1724 05/09/17 1548  Weight: 88.9 kg (196 lb) 88.9 kg (196 lb) 93.6 kg (206 lb 5.6 oz)    Exam: 05/11/17  General: 77 CF WD WN NAD A&O x 3  Cardiovascular: RRR no rubs or gallops. No JVD or thyromegaly  Respiratory: Clear to auscultation with no wheezes or rales.  Abdomen: Soft nontender nondistended normal bowel sounds x4 quadrant.  Musculoskeletal: 2 out of 4 pulses in all 4  extremities.  Skin: No ulcerative lesions noted.  Psychiatry:Mood is appropriate for condition and setting.   Data Reviewed: CBC: Recent Labs  Lab 05/08/17 1713 05/09/17 0457 05/10/17 0531 05/11/17 0542  WBC 13.7* 8.7 11.2* 8.3  NEUTROABS 11.5*  --   --   --   HGB 12.6 12.2 12.2 10.7*  HCT 39.3 38.1 38.6 33.3*  MCV 90.8 91.4 93.5 92.8  PLT 257 220 204 371   Basic Metabolic Panel: Recent Labs  Lab 05/08/17 1713 05/09/17 0457 05/11/17 0542  NA 138 138 140  K 4.1 3.7 3.8  CL 101 102 107  CO2 28 25 27   GLUCOSE 117* 111* 116*  BUN 16 15 14   CREATININE 1.03* 1.02* 0.94  CALCIUM 9.3 8.9 7.9*   GFR: Estimated Creatinine Clearance: 57.8 mL/min (by C-G formula based on SCr of 0.94 mg/dL). Liver Function Tests: No results for input(s): AST, ALT, ALKPHOS, BILITOT, PROT, ALBUMIN in the last 168 hours. No results for input(s): LIPASE, AMYLASE in the last 168 hours. No results for input(s): AMMONIA in the last 168 hours. Coagulation Profile: No results for input(s): INR, PROTIME in the last 168 hours. Cardiac Enzymes: No results for input(s): CKTOTAL, CKMB, CKMBINDEX, TROPONINI in the last 168 hours. BNP (last 3 results) No results for input(s): PROBNP in the last 8760 hours. HbA1C: No results for input(s): HGBA1C in the last 72 hours. CBG: No results for input(s): GLUCAP in the last 168 hours. Lipid Profile: No results for input(s): CHOL, HDL, LDLCALC, TRIG, CHOLHDL, LDLDIRECT in the last 72 hours. Thyroid Function Tests: No results for input(s): TSH, T4TOTAL, FREET4, T3FREE, THYROIDAB in the last 72 hours. Anemia Panel: No results for input(s): VITAMINB12, FOLATE, FERRITIN, TIBC, IRON, RETICCTPCT in the last 72 hours. Urine analysis:    Component Value Date/Time   COLORURINE YELLOW 07/04/2014 [place] 07/04/2014 1317   LABSPEC 1.011 07/04/2014 1317   PHURINE 6.5 07/04/2014 1317   GLUCOSEU NEGATIVE 07/04/2014 1317   HGBUR NEGATIVE 07/04/2014 1317     [place] 07/04/2014 1317   BILIRUBINUR Neg 01/12/2013 1604   KETONESUR NEGATIVE 07/04/2014 1317   PROTEINUR NEGATIVE 07/04/2014 1317   UROBILINOGEN 0.2 07/04/2014 1317   NITRITE NEGATIVE 07/04/2014 1317   LEUKOCYTESUR NEGATIVE 07/04/2014 1317   Sepsis Labs: @LABRCNTIP (procalcitonin:4,lacticidven:4)  )No results found for this or any previous visit (from the past 240 hour(s)).    Studies: No results found.  Scheduled Meds: . citalopram  20 mg Oral Daily  . enoxaparin (LOVENOX) injection  40 mg Subcutaneous QHS  . levothyroxine  88 mcg Oral QAC breakfast  . losartan  100 mg Oral Daily  . mometasone-formoterol  2 puff Inhalation Daily  . montelukast  10 mg Oral QHS  . pantoprazole  40 mg Oral BID  . rosuvastatin  20 mg Oral Daily  . senna-docusate  1 tablet Oral BID  . tiotropium  18 mcg Inhalation Daily    Continuous Infusions:    LOS: 0 days  Kayleen Memos, MD [facility] Pager [phone]  If 7PM-7AM, please contact night-coverage [email or web address] Password [employer] 05/11/2017, 7:25 PM

## 2017-05-11 NOTE — Clinical Social Work Note (Signed)
Clinical Social Work Assessment  Patient Details  Name: [name] MRN: [id number] Date of Birth: [birth year]  Date of referral:  05/11/17               Reason for consult:  Facility Placement                Permission sought to share information with:  Chartered certified accountant granted to share information::  Yes, Verbal Permission Granted  Name::        Agency::     Relationship::     Contact Information:     Housing/Transportation Living arrangements for the past 2 months:  Single Family Home Source of Information:  Patient Patient Interpreter Needed:  None Criminal Activity/Legal Involvement Pertinent to Current Situation/Hospitalization:  No - Comment as needed Significant Relationships:  Adult Children Lives with:  Self Do you feel safe going back to the place where you live?  (PT recommending SNF) Need for family participation in patient care:  No (Coment)  Care giving concerns:  Patient from home alone. Patient reported that she was independent with ambulation and ADLs prior to hospitalization. Patient admitted after having a fall at home. PT recommending SNF.   Social Worker assessment / plan:  CSW spoke with patient at bedside regarding PT recommendation for SNF. CSW explained SNF placement process and insurance authorization, patient verbalized understanding. Patient reported that she is agreeable to SNF and prefers [facility]. CSW agreed to follow up with patient's SNF preference.  CSW completed FL2 and will follow up with [facility].  CSW contacted [facility], staff agreed to review patient's referral on the hub.   CSW will continue to follow and assist with discharge planning.   Employment status:  Retired Nurse, adult PT Recommendations:  [place] / Referral to community resources:  [place]  Patient/Family's Response to care:  Patient appreciative  of CSW assistance with discharge planning.   Patient/Family's Understanding of and Emotional Response to Diagnosis, Current Treatment, and Prognosis:  Patient presented calm and verbalized understanding of diagnosis and current treatment. Patient verbalized plan to dc to SNF for ST rehab.   Emotional Assessment Appearance:  Appears stated age Attitude/Demeanor/Rapport:  Other(Cooperative) Affect (typically observed):  Appropriate, Calm Orientation:  Oriented to Self, Oriented to Place, Oriented to  Time, Oriented to Situation Alcohol / Substance use:  Not Applicable Psych involvement (Current and /or in the community):  No (Comment)  Discharge Needs  Concerns to be addressed:  Care Coordination Readmission within the last 30 days:  No Current discharge risk:  Physical Impairment Barriers to Discharge:  Continued Medical Work up, [employer], LCSW 05/11/2017, 3:06 PM

## 2017-05-11 NOTE — NC FL2 (Signed)
[place] LEVEL OF CARE SCREENING TOOL     IDENTIFICATION  Patient Name: [name] Birthdate: [birth year] Sex: female Admission Date (Current Location): 05/08/2017  [facility] and [place] Number:  Herbalist and Address:  [facility],  [place], [place]      Provider Number: [id number]  Attending Physician Name and Address:  Kayleen Memos, DO  Relative Name and Phone Number:       Current Level of Care: Hospital Recommended Level of Care: [place] Prior Approval Number:    Date Approved/Denied:   PASRR Number: [id number]  Discharge Plan: SNF    Current Diagnoses: Patient Active Problem List   Diagnosis Date Noted  . Accidental fall from chair, initial encounter 05/08/2017  . Closed posttraumatic compression fracture of lumbar vertebra ([place]) 05/08/2017  . Leukocytosis 05/08/2017  . 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 S/P knee replacement 07/10/2014  . COLD (chronic obstructive lung disease) ([place]) 09/24/2013  . Throat pain 09/24/2013  . Other specified acquired hypothyroidism 09/24/2013  . Impaired fasting glucose 09/24/2013  . Anxiety state, unspecified 04/23/2013  . Insomnia 04/23/2013  . Routine general medical examination at a health care facility 04/02/2013  . Backache 04/02/2013  . Osteopenia 04/02/2013  . Hypothyroidism 07/24/2012  . Essential hypertension, benign 07/24/2012  . Sleep disturbances 07/24/2012  . Allergic rhinitis 07/24/2012  . Arthritis 07/24/2012  . Other and unspecified hyperlipidemia 07/24/2012  . ALLERGIC RHINITIS 04/20/2007  . ASTHMA 04/20/2007  . DYSPNEA 04/20/2007    Orientation RESPIRATION BLADDER Height & Weight     Self, Time, Situation, Place  O2 Continent Weight: 206 lb 5.6 oz (93.6 kg) Height:  5\' 6"  (167.6 cm)  BEHAVIORAL SYMPTOMS/MOOD NEUROLOGICAL BOWEL NUTRITION STATUS        Diet(heart)  AMBULATORY STATUS COMMUNICATION OF NEEDS Skin   Extensive  Assist Verbally Normal                       Personal Care Assistance Level of Assistance  Bathing, Feeding, Dressing Bathing Assistance: Maximum assistance Feeding assistance: Independent Dressing Assistance: Maximum assistance     Functional Limitations Info  Sight, Hearing, Speech Sight Info: Adequate Hearing Info: Adequate Speech Info: Adequate    SPECIAL CARE FACTORS FREQUENCY  PT (By licensed PT), OT (By licensed OT)     PT Frequency: 5x/week OT Frequency: 5x/week            Contractures Contractures Info: Not present    Additional Factors Info  Code Status, Allergies, Psychotropic Code Status Info: Full Code Allergies Info: NKA Psychotropic Info: Celexa         Current Medications (05/11/2017):  This is the current hospital active medication list Current Facility-Administered Medications  Medication Dose Route Frequency Provider Last Rate Last Dose  . acetaminophen (TYLENOL) tablet 650 mg  650 mg Oral Q6H PRN Fuller Plan A, MD   650 mg at 05/10/17 1141   Or  . acetaminophen (TYLENOL) suppository 650 mg  650 mg Rectal Q6H PRN Smith, Rondell A, MD      . albuterol (PROVENTIL) (2.5 MG/3ML) 0.083% nebulizer solution 2.5 mg  2.5 mg Nebulization Q6H PRN Smith, Rondell A, MD      . bisacodyl (DULCOLAX) EC tablet 5 mg  5 mg Oral Daily PRN Irene Pap N, DO      . citalopram (CELEXA) tablet 20 mg  20 mg Oral Daily Smith, Rondell A, MD   20 mg  at 05/11/17 0913  . diphenhydrAMINE (BENADRYL) capsule 25 mg  25 mg Oral QHS PRN Schorr, Rhetta Mura, NP   25 mg at 05/10/17 2222  . enoxaparin (LOVENOX) injection 40 mg  40 mg Subcutaneous QHS Smith, Rondell A, MD   40 mg at 05/10/17 2223  . hydrALAZINE (APRESOLINE) injection 10 mg  10 mg Intravenous Q4H PRN Fuller Plan A, MD      . levothyroxine (SYNTHROID, LEVOTHROID) tablet 88 mcg  88 mcg Oral QAC breakfast Fuller Plan A, MD   88 mcg at 05/11/17 0913  . losartan (COZAAR) tablet 100 mg  100 mg Oral Daily Irene Pap N, DO   100 mg at 05/11/17 0913  . mometasone-formoterol (DULERA) 200-5 MCG/ACT inhaler 2 puff  2 puff Inhalation Daily Fuller Plan A, MD   2 puff at 05/11/17 0714  . montelukast (SINGULAIR) tablet 10 mg  10 mg Oral QHS Smith, Rondell A, MD   10 mg at 05/10/17 2222  . naloxone [facility]) injection 0.4 mg  0.4 mg Intravenous PRN Irene Pap N, DO      . ondansetron (ZOFRAN) tablet 4 mg  4 mg Oral Q6H PRN Fuller Plan A, MD       Or  . ondansetron (ZOFRAN) injection 4 mg  4 mg Intravenous Q6H PRN Fuller Plan A, MD   4 mg at 05/09/17 1316  . oxyCODONE (Oxy IR/ROXICODONE) immediate release tablet 5 mg  5 mg Oral Q4H PRN Fuller Plan A, MD   5 mg at 05/11/17 0646  . pantoprazole (PROTONIX) EC tablet 40 mg  40 mg Oral BID Fuller Plan A, MD   40 mg at 05/11/17 0913  . rosuvastatin (CRESTOR) tablet 20 mg  20 mg Oral Daily Tamala Julian, Rondell A, MD   20 mg at 05/11/17 0913  . senna-docusate (Senokot-S) tablet 1 tablet  1 tablet Oral BID Irene Pap N, DO   1 tablet at 05/11/17 0913  . tiotropium (SPIRIVA) inhalation capsule 18 mcg  18 mcg Inhalation Daily Norval Morton, MD   18 mcg at 05/09/17 1610     Discharge Medications: Please see discharge summary for a list of discharge medications.  Relevant Imaging Results:  Relevant Lab Results:   Additional Information SSN   [id number]  Burnis Medin, LCSW

## 2017-05-11 NOTE — Progress Notes (Signed)
Occupational Therapy Treatment Patient Details Name: [name] MRN: [id number] DOB: 1940/05/04 Today's Date: 05/11/2017    History of present illness 77 y.o. female with medical history significant of HTN, HLD, hypothyroidism, MCNS, COPD, and GERD; who presents after having a fall and found to have sustained acute burst fracture of L1 vertebral body and compression fracture of L2.  Plan for nonsurgical treatment and requires TLSO brace   OT comments  Educated on application of TLSO from bed level. Pt did not want to get OOB this pm  Follow Up Recommendations  SNF    Equipment Recommendations  3 in 1 bedside commode    Recommendations for Other Services      Precautions / Restrictions Precautions Precautions: Back Required Braces or Orthoses: Spinal Brace Spinal Brace: Thoracolumbosacral orthotic Restrictions Weight Bearing Restrictions: No       Mobility Bed Mobility     Rolling: Min guard         General bed mobility comments: use of rail  Transfers                      Balance                                           ADL either performed or assessed with clinical judgement   ADL                                         General ADL Comments: pt is unsure whether or not she is leaving for rehab today. Requested assistance to don brace.  educated on application was we donned it. She did not want to get OOB at this time. she is using purewick for urinary needs     Vision       Perception     Praxis      Cognition Arousal/Alertness: Awake/alert Behavior During Therapy: WFL for tasks assessed/performed Overall Cognitive Status: Within Functional Limits for tasks assessed                                          Exercises     Shoulder Instructions       General Comments      Pertinent Vitals/ Pain       Pain Score: 4  Pain Location: back with movement Pain Descriptors /  Indicators: Sore Pain Intervention(s): Limited activity within patient's tolerance;Monitored during session;Repositioned  Home Living                                          Prior Functioning/Environment              Frequency  Min 2X/week        Progress Toward Goals  OT Goals(current goals can now be found in the care plan section)  Progress towards OT goals: Progressing toward goals     Plan      Co-evaluation                 AM-PAC PT "6 Clicks" Daily Activity  Outcome Measure   Help from another person eating meals?: None Help from another person taking care of personal grooming?: A Little Help from another person toileting, which includes using toliet, bedpan, or urinal?: A Lot Help from another person bathing (including washing, rinsing, drying)?: A Lot Help from another person to put on and taking off regular upper body clothing?: A Lot Help from another person to put on and taking off regular lower body clothing?: Total 6 Click Score: 14    End of Session    OT Visit Diagnosis: Unsteadiness on feet (R26.81);Repeated falls (R29.6);Muscle weakness (generalized) (M62.81);Pain   Activity Tolerance Patient tolerated treatment well   Patient Left in bed   Nurse Communication          Time: 9539-6728 OT Time Calculation (min): 17 min  Charges: OT General Charges $OT Visit: 1 Visit OT Treatments $Self Care/Home Management : 8-22 mins  Lesle Chris, OTR/L [phone] 05/11/2017   [name] 05/11/2017, 2:57 PM

## 2017-05-11 NOTE — Plan of Care (Signed)
  Problem: Activity: °Goal: Risk for activity intolerance will decrease °Outcome: Progressing °  °Problem: Coping: °Goal: Level of anxiety will decrease °Outcome: Progressing °  °Problem: Elimination: °Goal: Will not experience complications related to bowel motility °Outcome: Progressing °Goal: Will not experience complications related to urinary retention °Outcome: Progressing °  °Problem: Pain Managment: °Goal: General experience of comfort will improve °Outcome: Progressing °  °Problem: Skin Integrity: °Goal: Risk for impaired skin integrity will decrease °Outcome: Progressing °  °

## 2017-05-12 DIAGNOSIS — E039 Hypothyroidism, unspecified: Secondary | ICD-10-CM | POA: Diagnosis not present

## 2017-05-12 DIAGNOSIS — W07XXXA Fall from chair, initial encounter: Secondary | ICD-10-CM | POA: Diagnosis not present

## 2017-05-12 DIAGNOSIS — I10 Essential (primary) hypertension: Secondary | ICD-10-CM | POA: Diagnosis not present

## 2017-05-12 DIAGNOSIS — S32000A Wedge compression fracture of unspecified lumbar vertebra, initial encounter for closed fracture: Secondary | ICD-10-CM | POA: Diagnosis not present

## 2017-05-12 MED ORDER — OXYCODONE HCL 5 MG PO TABS: 5.0000 mg | ORAL_TABLET | ORAL | 0 refills | Status: AC | PRN

## 2017-05-12 MED ORDER — ACETAMINOPHEN 500 MG PO TABS: 1000.0000 mg | ORAL_TABLET | Freq: Four times a day (QID) | ORAL | Status: DC | PRN

## 2017-05-12 NOTE — Progress Notes (Signed)
Report called to Chad at [facility].Stacey Drain

## 2017-05-12 NOTE — Discharge Summary (Signed)
Physician Discharge Summary  [name]  YKD:[id number]  DOB: 1940/09/08  DOA: 05/08/2017 PCP: Jonathon Resides, MD  Admit date: 05/08/2017 Discharge date: 05/12/2017  Admitted From: Home Disposition: SNF  Recommendations for Outpatient Follow-up:  1. Follow up with SNF provider at earliest convenience 2. Please obtain BMP/CBC in one week to monitor hemoglobin and renal function  Discharge Condition: Stable CODE STATUS: Full code Diet recommendation: Heart Healthy   Brief/Interim Summary: For full details see H&P/Progress note, but in brief, [name] is a 77 year old female with medical history of hypertension, hypothyroidism, M CNS, COPD and GERD who presented to the emergency department after mechanical fall.  Patient fell onto her buttocks and hit her head against the dishwasher.  Patient did not have LOC.  Upon ED evaluation CT of the lumbar spine revealed acute versus fracture of L1 vertebral body and compression fracture of L2.  Patient was discussed with neurosurgery who recommended a TLSO brace, pain management and no surgical intervention at this time.  She was admitted for pain management and physical therapy evaluation.  Subjective: Patient seen and examined, her pain is well controlled on oral pain medications.  Was able to participate with PT who is recommending short-term rehab.  She denies chest pain, shortness of breath and palpitation.  No other concerns at this time.  Discharge Diagnoses/Hospital Course:  Close posttraumatic compression fracture of L1 and L2 Neurosurgery was consulted and recommended conservative management with TLSO brace and physical therapy.  Patient pain well controlled currently on OxyIR as needed and Tylenol.  We will continue current management.  Patient to follow-up with Dr. Arnoldo Morale as an outpatient in 1-2 weeks. Patient is being discharged to SNF for SRT  Hypertension BP stable during hospital stay Continue home medications with  no changes  CKD stage III Creatinine at baseline Follow BMP in 1 week  Depression/anxiety Continue Celexa  Hypothyroidism Continue levothyroxine  Obesity -BMI 31 Encourage diet and weight loss  All other chronic medical condition were stable during the hospitalization.  On the day of the discharge the patient's vitals were stable, and no other acute medical condition were reported by patient. the patient was felt safe to be discharge to SNF  Discharge Instructions  You were cared for by a hospitalist during your hospital stay. If you have any questions about your discharge medications or the care you received while you were in the hospital after you are discharged, you can call the unit and asked to speak with the hospitalist on call if the hospitalist that took care of you is not available. Once you are discharged, your primary care physician will handle any further medical issues. Please note that NO REFILLS for any discharge medications will be authorized once you are discharged, as it is imperative that you return to your primary care physician (or establish a relationship with a primary care physician if you do not have one) for your aftercare needs so that they can reassess your need for medications and monitor your lab values.  Discharge Instructions    Call MD for:  difficulty breathing, headache or visual disturbances   Complete by:  As directed    Call MD for:  extreme fatigue   Complete by:  As directed    Call MD for:  hives   Complete by:  As directed    Call MD for:  persistant dizziness or light-headedness   Complete by:  As directed    Call MD for:  persistant nausea  and vomiting   Complete by:  As directed    Call MD for:  redness, tenderness, or signs of infection (pain, swelling, redness, odor or green/yellow discharge around incision site)   Complete by:  As directed    Call MD for:  severe uncontrolled pain   Complete by:  As directed    Call MD for:   temperature >100.4   Complete by:  As directed    Diet - low sodium heart healthy   Complete by:  As directed    Increase activity slowly   Complete by:  As directed      Allergies as of 05/12/2017   No Known Allergies     Medication List    STOP taking these medications   docusate sodium 100 MG capsule Commonly known as:  COLACE   ferrous sulfate 325 (65 FE) MG tablet   HYDROcodone-acetaminophen 7.5-325 MG tablet Commonly known as:  NORCO   pantoprazole 40 MG tablet Commonly known as:  PROTONIX   polyethylene glycol packet Commonly known as:  MIRALAX / GLYCOLAX   tiZANidine 4 MG tablet Commonly known as:  ZANAFLEX     TAKE these medications   acetaminophen 500 MG tablet Commonly known as:  TYLENOL Take 2 tablets (1,000 mg total) by mouth every 6 (six) hours as needed for mild pain.   albuterol 108 (90 Base) MCG/ACT inhaler Commonly known as:  PROAIR HFA Inhale 2 puffs into the lungs every 6 (six) hours as needed for wheezing or shortness of breath.   Calcium-Vitamin D 600-400 MG-UNIT Tabs Take 1 tablet by mouth 2 (two) times daily.   cholecalciferol 1000 units tablet Commonly known as:  VITAMIN D Take 1,000 Units by mouth 2 (two) times daily.   citalopram 20 MG tablet Commonly known as:  CELEXA TAKE 1 TABLET BY MOUTH EVERY DAY AT NIGHT What changed:  Another medication with the same name was removed. Continue taking this medication, and follow the directions you see here.   DULERA 200-5 MCG/ACT Aero Generic drug:  mometasone-formoterol Inhale 2 puffs into the lungs daily.   levothyroxine 88 MCG tablet Commonly known as:  SYNTHROID, LEVOTHROID Take 88 mcg by mouth every morning. What changed:  Another medication with the same name was removed. Continue taking this medication, and follow the directions you see here.   losartan 50 MG tablet Commonly known as:  COZAAR Take 50 mg by mouth daily.   montelukast 10 MG tablet Commonly known as:   SINGULAIR TAKE 1 TABLET BY MOUTH EVERY DAY AT NIGHT What changed:  Another medication with the same name was removed. Continue taking this medication, and follow the directions you see here.   multivitamin with minerals Tabs tablet Take 1 tablet by mouth 2 (two) times daily.   omeprazole 20 MG capsule Commonly known as:  PRILOSEC TAKE 1 CAPSULE (20 MG TOTAL) BY MOUTH TWO (2) TIMES A DAY.   oxyCODONE 5 MG immediate release tablet Commonly known as:  Oxy IR/ROXICODONE Take 1 tablet (5 mg total) by mouth every 4 (four) hours as needed for up to 3 days for moderate pain.   rosuvastatin 40 MG tablet Commonly known as:  CRESTOR Take 20 mg by mouth daily. MWF What changed:  Another medication with the same name was removed. Continue taking this medication, and follow the directions you see here.   SPIRIVA RESPIMAT 2.5 MCG/ACT Aers Generic drug:  Tiotropium Bromide Monohydrate Take 2 puffs by mouth daily.  Contact information for follow-up providers    Newman Pies, MD. Schedule an appointment as soon as possible for a visit in 2 week(s).   Specialty:  Neurosurgery Why:  Hospitall follow up  Contact information: [address] [place] [address] [phone]            Contact information for after-discharge care    Destination    [facility] .   Service:  Skilled Nursing Contact information: [address] [place] [phone]                 No Known Allergies  Consultations:   Procedures/Studies: Dg Lumbar Spine Complete  Result Date: 05/08/2017 CLINICAL DATA:  Low back and left leg pain after fall. EXAM: LUMBAR SPINE - COMPLETE 4+ VIEW COMPARISON:  None. FINDINGS: Moderate compression deformity of L1 vertebral body is noted consistent with acute fracture. No spondylolisthesis is noted. Moderate degenerative disc disease is noted at L4-5 and L5-S1. IMPRESSION: Probable acute  compression fracture of L1 vertebral body. CT scan may be performed for further evaluation. Electronically Signed   By: Marijo Conception, M.D.   On: 05/08/2017 15:29   Dg Tibia/fibula Left  Result Date: 05/08/2017 CLINICAL DATA:  Fall.  Swelling about anterior tibia. EXAM: LEFT TIBIA AND FIBULA - 2 VIEW COMPARISON:  None. FINDINGS: Left medial hemiarthroplasty. Osteopenia. No acute fracture or dislocation. Moderate degenerate changes of the patellofemoral articulation of the knee. No knee joint effusion. Small Achilles and calcaneal spurs. IMPRESSION: Postoperative and degenerative changes.  No acute findings. Electronically Signed   By: Abigail Miyamoto M.D.   On: 05/08/2017 15:26   Ct Head Wo Contrast  Result Date: 05/08/2017 CLINICAL DATA:  Fall.  Head injury. EXAM: CT HEAD WITHOUT CONTRAST TECHNIQUE: Contiguous axial images were obtained from the base of the skull through the vertex without intravenous contrast. COMPARISON:  None. FINDINGS: Brain: cerebral atrophy with resultant prominence of the extra-axial spaces adjacent the frontal lobes. No mass lesion, hemorrhage, hydrocephalus, acute infarct, intra-axial, or extra-axial fluid collection. Vascular: Intracranial atherosclerosis. Skull: Hyperostosis frontalis interna. No displaced fractures or significant soft tissue swelling. Sinuses/Orbits: Normal imaged portions of the orbits and globes. Clear paranasal sinuses and mastoid air cells. Other: None. IMPRESSION: 1.  No acute intracranial abnormality. 2. Cerebral atrophy. Electronically Signed   By: Abigail Miyamoto M.D.   On: 05/08/2017 15:29   Ct Lumbar Spine Wo Contrast  Result Date: 05/08/2017 CLINICAL DATA:  Low back pain status post fall. EXAM: CT LUMBAR SPINE WITHOUT CONTRAST TECHNIQUE: Multidetector CT imaging of the lumbar spine was performed without intravenous contrast administration. Multiplanar CT image reconstructions were also generated. COMPARISON:  None. FINDINGS: Segmentation: 5 lumbar  type vertebrae. Alignment: Normal. Vertebrae: L1 vertebral body compression fracture with approximately 50% height loss and 6 mm retropulsion of the superior posterior margin of the L1 vertebral body mildly impressing on the spinal canal resulting in mild spinal stenosis. L2 vertebral body compression fracture without significant height loss and 3 mm of retropulsion of the superior posterior margin of the L2 vertebral body without significant impression on the thecal sac. Remainder the vertebral body heights are maintained. No aggressive osseous lesion. Paraspinal and other soft tissues: No acute paraspinal abnormality. Abdominal aortic atherosclerosis. Disc levels: Degenerative disc disease with disc height loss at L5-S1 and to lesser extent L4-5. moderate bilateral facet arthropathy at L1-2. Broad-based disc bulge at L2-3 with mild bilateral facet arthropathy. Mild broad-based disc bulge  and bilateral facet arthropathy at L3-4 and L4-5. IMPRESSION: 1. Acute L1 vertebral body compression fracture with approximately 50% height loss and 6 mm of retropulsion of the superior posterior margin of the L1 vertebral body mildly impressing on the spinal canal. 2. Acute L2 vertebral body compression fracture without significant height loss and 3 mm of retropulsion of the superior posterior margin of the L2 vertebral body. Electronically Signed   By: Kathreen Devoid   On: 05/08/2017 16:36    Discharge Exam: Vitals:   05/12/17 0500 05/12/17 1156  BP: (!) 155/73   Pulse: 71   Resp: 18   Temp: 98.6 F (37 C)   SpO2: 93% 95%   Vitals:   05/11/17 1241 05/11/17 2058 05/12/17 0500 05/12/17 1156  BP: (!) 142/54 (!) 145/65 (!) 155/73   Pulse: 79 73 71   Resp: 18 18 18    Temp: 99.3 F (37.4 C) 98.5 F (36.9 C) 98.6 F (37 C)   TempSrc: Oral Oral Oral   SpO2: 93% 94% 93% 95%  Weight:      Height:        General: Pt is alert, awake, not in acute distress Cardiovascular: RRR, S1/S2 +, no rubs, no  gallops Respiratory: CTA bilaterally, no wheezing, no rhonchi Abdominal: Soft, NT, ND, bowel sounds + Extremities: no edema Back: Brace in place    The results of significant diagnostics from this hospitalization (including imaging, microbiology, ancillary and laboratory) are listed below for reference.     Microbiology: No results found for this or any previous visit (from the past 240 hour(s)).   Labs: BNP (last 3 results) No results for input(s): BNP in the last 8760 hours. Basic Metabolic Panel: Recent Labs  Lab 05/08/17 1713 05/09/17 0457 05/11/17 0542  NA 138 138 140  K 4.1 3.7 3.8  CL 101 102 107  CO2 28 25 27   GLUCOSE 117* 111* 116*  BUN 16 15 14   CREATININE 1.03* 1.02* 0.94  CALCIUM 9.3 8.9 7.9*   Liver Function Tests: No results for input(s): AST, ALT, ALKPHOS, BILITOT, PROT, ALBUMIN in the last 168 hours. No results for input(s): LIPASE, AMYLASE in the last 168 hours. No results for input(s): AMMONIA in the last 168 hours. CBC: Recent Labs  Lab 05/08/17 1713 05/09/17 0457 05/10/17 0531 05/11/17 0542  WBC 13.7* 8.7 11.2* 8.3  NEUTROABS 11.5*  --   --   --   HGB 12.6 12.2 12.2 10.7*  HCT 39.3 38.1 38.6 33.3*  MCV 90.8 91.4 93.5 92.8  PLT 257 220 204 172   Cardiac Enzymes: No results for input(s): CKTOTAL, CKMB, CKMBINDEX, TROPONINI in the last 168 hours. BNP: Invalid input(s): POCBNP CBG: No results for input(s): GLUCAP in the last 168 hours. D-Dimer No results for input(s): DDIMER in the last 72 hours. Hgb A1c No results for input(s): HGBA1C in the last 72 hours. Lipid Profile No results for input(s): CHOL, HDL, LDLCALC, TRIG, CHOLHDL, LDLDIRECT in the last 72 hours. Thyroid function studies No results for input(s): TSH, T4TOTAL, T3FREE, THYROIDAB in the last 72 hours.  Invalid input(s): FREET3 Anemia work up No results for input(s): VITAMINB12, FOLATE, FERRITIN, TIBC, IRON, RETICCTPCT in the last 72 hours. Urinalysis    Component Value  Date/Time   COLORURINE YELLOW 07/04/2014 [place] 07/04/2014 1317   LABSPEC 1.011 07/04/2014 1317   PHURINE 6.5 07/04/2014 1317   GLUCOSEU NEGATIVE 07/04/2014 1317   HGBUR NEGATIVE 07/04/2014 [place] 07/04/2014 1317  BILIRUBINUR Neg 01/12/2013 [place] 07/04/2014 1317   PROTEINUR NEGATIVE 07/04/2014 1317   UROBILINOGEN 0.2 07/04/2014 1317   NITRITE NEGATIVE 07/04/2014 1317   LEUKOCYTESUR NEGATIVE 07/04/2014 1317   Sepsis Labs Invalid input(s): PROCALCITONIN,  WBC,  LACTICIDVEN Microbiology No results found for this or any previous visit (from the past 240 hour(s)).   Time coordinating discharge: 35 minutes  SIGNED:  Chipper Oman, MD  [facility] 05/12/2017, 2:30 PM  Pager please text page via  [email or web address]  Note - This record has been created using [employer]. Chart creation errors have been sought, but may not always have been located. Such creation errors do not reflect on the standard of medical care.

## 2017-05-12 NOTE — Clinical Social Work Placement (Signed)
Insurance Authorization Pending for placement.   CLINICAL SOCIAL WORK PLACEMENT  NOTE  Date:  05/12/2017  Patient Details  Name: [name] MRN: [id number] Date of Birth: Aug 27, 1940  Clinical Social Work is seeking post-discharge placement for this patient at the [place] level of care (*CSW will initial, date and re-position this form in  chart as items are completed):  Yes   Patient/family provided with [facility] Work Department's list of facilities offering this level of care within the geographic area requested by the patient (or if unable, by the patient's family).  Yes   Patient/family informed of their freedom to choose among providers that offer the needed level of care, that participate in Medicare, Medicaid or managed care program needed by the patient, have an available bed and are willing to accept the patient.  Yes   Patient/family informed of [facility]'s ownership interest in [facility] and [facility], as well as of the fact that they are under no obligation to receive care at these facilities.  PASRR submitted to EDS on 05/11/17     PASRR number received on 05/11/17     Existing PASRR number confirmed on       FL2 transmitted to all facilities in geographic area requested by pt/family on       FL2 transmitted to all facilities within larger geographic area on       Patient informed that his/her managed care company has contracts with or will negotiate with certain facilities, including the following:        No   Patient/family informed of bed offers received.  Patient chooses bed at [facility]     Physician recommends and patient chooses bed at      Patient to be transferred to [facility] on 05/12/17.  Patient to be transferred to facility by [facility]      Patient family notified on 05/12/17 of transfer.  Name of family member notified:  Patietn has notified her daughter.       PHYSICIAN       Additional Comment:    _______________________________________________ Lia Hopping, LCSW 05/12/2017, 11:38 AM

## 2017-05-13 ENCOUNTER — Non-Acute Institutional Stay (SKILLED_NURSING_FACILITY): Payer: Medicare Other | Admitting: Adult Health

## 2017-05-13 ENCOUNTER — Encounter: Payer: Self-pay | Admitting: Adult Health

## 2017-05-13 DIAGNOSIS — K219 Gastro-esophageal reflux disease without esophagitis: Secondary | ICD-10-CM

## 2017-05-13 DIAGNOSIS — T402X5A Adverse effect of other opioids, initial encounter: Secondary | ICD-10-CM

## 2017-05-13 DIAGNOSIS — J3089 Other allergic rhinitis: Secondary | ICD-10-CM

## 2017-05-13 DIAGNOSIS — F419 Anxiety disorder, unspecified: Secondary | ICD-10-CM

## 2017-05-13 DIAGNOSIS — J449 Chronic obstructive pulmonary disease, unspecified: Secondary | ICD-10-CM

## 2017-05-13 DIAGNOSIS — E034 Atrophy of thyroid (acquired): Secondary | ICD-10-CM

## 2017-05-13 DIAGNOSIS — E785 Hyperlipidemia, unspecified: Secondary | ICD-10-CM

## 2017-05-13 DIAGNOSIS — I10 Essential (primary) hypertension: Secondary | ICD-10-CM | POA: Diagnosis not present

## 2017-05-13 DIAGNOSIS — S32000D Wedge compression fracture of unspecified lumbar vertebra, subsequent encounter for fracture with routine healing: Secondary | ICD-10-CM

## 2017-05-13 DIAGNOSIS — K5903 Drug induced constipation: Secondary | ICD-10-CM

## 2017-05-13 NOTE — Progress Notes (Signed)
Location:   [facility] Room Number: [place] of Service:  SNF (31)   CODE STATUS: Full Code  No Known Allergies  Chief Complaint  Patient presents with  . Hospitalization Follow-up    Hospital follow up    HPI:  She had been hospitalized from 05-08-17 through 05-13-07 after a fall at home and suffered L1 vertebral body and compression fracture of L2. The decision was made for conservative treatment including use of TLSO brace and physical therapy. Her pain is being managed with oxycodone as needed. She will need therapy to improve upon her independence with her adls. She is having constipation. Denies insomnia.  Her pain is being managed with the prn oxycodone. She will continue to be followed for her chronic illnesses including: hypertension; chronic obstructive lung disease; hypothyroidism. There are no nursing concerns at this time.   Past Medical History:  Diagnosis Date  . Actinic keratosis   . Arthritis   . Asthma   . Basal cell carcinoma   . COPD (chronic obstructive pulmonary disease) ([place])   . GERD (gastroesophageal reflux disease)   . Heart murmur    hx of in childhood   . Hyperlipidemia   . Hypertension   . Hypothyroidism   . MCNS (minimal change nephrotic syndrome)   . Osteopenia   . Shortness of breath dyspnea    on exertion   . Squamous cell carcinoma     Past Surgical History:  Procedure Laterality Date  . ABDOMINAL HYSTERECTOMY    . APPENDECTOMY    . BILATERAL SALPINGOOPHORECTOMY     Ovarian Cysts   . 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 MEDIAL PARTIAL KNEE REPLACEMENT Bilateral   . MOHS SURGERY     x 2  . RENAL BIOPSY     x 2  . ROTATOR CUFF REPAIR Left     Social History   Socioeconomic History  . Marital status: Widowed    Spouse name: Not on file  .  Number of children: 2  . Years of education: 78  . Highest education level: Not on file  Occupational History  . 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 Financial resource strain: Not on file  . Food insecurity:    Worry: Not on file    Inability: Not on file  . Transportation needs:    Medical: Not on file    Non-medical: Not on file  Tobacco Use  . Smoking status: Former Smoker    Packs/day: 1.50    Years: 20.00    Pack years: 30.00    Types: Cigarettes    Last attempt to quit: 01/12/1973    Years since quitting: 44.3  . Smokeless tobacco: Never Used  Substance and Sexual Activity  . Alcohol use: Yes    Alcohol/week: 0.0 oz    Types: 1 Standard drinks or equivalent per week    Comment: She occasionally drinks a glass of wine.    . Drug use: No  . Sexual activity: Not Currently    Partners: Male  Lifestyle  . Physical activity:    Days per week: Not on file    Minutes per session: Not on file  . Stress: Not on file  Relationships  .  Social connections:    Talks on phone: Not on file    Gets together: Not on file    Attends religious service: Not on file    Active member of club or organization: Not on file    Attends meetings of clubs or organizations: Not on file    Relationship status: Not on file  . Intimate partner violence:    Fear of current or ex partner: Not on file    Emotionally abused: Not on file    Physically abused: Not on file    Forced sexual activity: Not on file  Other Topics Concern  . Not on file  Social History Narrative   Marital Status:  Widowed      Siblings: Kitty Simmons/ Fernanda Drum   Children:  2;  Grandchildren:  5    Pets: None   Living Situation: Lives alone   Occupation: Retired Pharmacist, hospital   Education: Forensic psychologist    Tobacco Use/Exposure:  She quit smoking > 20 years ago after having smoked 1 1/2 ppd for over 20 years.     Alcohol Use:  Occasional (Wine)    Drug Use:   None   Diet:  Regular   Exercise:  Water Aerobic Class 3 days a week and Weights   Hobbies: Reading/ Crafts/ Biking/ Reading]   Family History  Problem Relation Age of Onset  . Lymphoma Father   . Heart disease Paternal Grandfather   . COPD Mother        Husband Smoked   . Stroke Maternal Grandfather   . Rheumatic fever Paternal Grandmother   . Hypertension Sister   . Scoliosis Daughter       VITAL SIGNS BP (!) 155/73   Pulse 71   Temp 98.6 F (37 C)   Resp 18   Ht 5\' 6"  (1.676 m)   Wt 206 lb (93.4 kg)   SpO2 93%   BMI 33.25 kg/m   Outpatient Encounter Medications as of 05/13/2017  Medication Sig  . acetaminophen (TYLENOL) 500 MG tablet Take 2 tablets (1,000 mg total) by mouth every 6 (six) hours as needed for mild pain.  Marland Kitchen albuterol (PROAIR HFA) 108 (90 BASE) MCG/ACT inhaler Inhale 2 puffs into the lungs every 6 (six) hours as needed for wheezing or shortness of breath.  . bisacodyl (DULCOLAX) 10 MG suppository Give 10mg  rectally x 1 dose in 24 hours as needed in not relieved by MOM  . Calcium Carb-Cholecalciferol (CALCIUM-VITAMIN D) 600-400 MG-UNIT TABS Take 1 tablet by mouth 2 (two) times daily.  . cholecalciferol (VITAMIN D) 1000 units tablet Take 1,000 Units by mouth 2 (two) times daily.  . citalopram (CELEXA) 20 MG tablet TAKE 1 TABLET BY MOUTH EVERY DAY AT NIGHT  . DULERA 200-5 MCG/ACT AERO Inhale 2 puffs into the lungs daily.   Marland Kitchen levothyroxine (SYNTHROID, LEVOTHROID) 88 MCG tablet Take 88 mcg by mouth every morning.  Marland Kitchen losartan (COZAAR) 50 MG tablet Take 50 mg by mouth daily.  . Magnesium Hydroxide (MILK OF MAGNESIA PO) Give 30 cc by mouth x 1 dose in 24 hours as needed if no BM in 3 days  . montelukast (SINGULAIR) 10 MG tablet TAKE 1 TABLET BY MOUTH EVERY DAY AT NIGHT  . Multiple Vitamin (MULTIVITAMIN WITH MINERALS) TABS tablet Take 1 tablet by mouth 2 (two) times daily.  Marland Kitchen omeprazole (PRILOSEC) 20 MG capsule TAKE 1 CAPSULE (20 MG TOTAL) BY MOUTH TWO (2) TIMES A  DAY.  Marland Kitchen oxyCODONE (OXY IR/ROXICODONE) 5  MG immediate release tablet Take 1 tablet (5 mg total) by mouth every 4 (four) hours as needed for up to 3 days for moderate pain.  . rosuvastatin (CRESTOR) 40 MG tablet Take 20 mg by mouth daily. MWF  . Sodium Phosphates (FLEET ENEMA RE) Give disposable Saline Enema rectally x 1 dose in 24 hours as needed if not relieved by Biscodyl  . SPIRIVA RESPIMAT 2.5 MCG/ACT AERS Take 2 puffs by mouth daily.    No facility-administered encounter medications on file as of 05/13/2017.      SIGNIFICANT DIAGNOSTIC EXAMS  TODAY:   05-08-17: left tibia/fibula x-ray: Postoperative and degenerative changes. No acute findings.   05-08-17: lumbar spine x-ray: Probable acute compression fracture of L1 vertebral body. CT scan may be performed for further evaluation.   05-08-17: ct of head: 1.  No acute intracranial abnormality. 2. Cerebral atrophy.  05-08-17: ct of lumbar spine: 1. Acute L1 vertebral body compression fracture with approximately 50% height loss and 6 mm of retropulsion of the superior posterior margin of the L1 vertebral body mildly impressing on the spinal canal. 2. Acute L2 vertebral body compression fracture without significant height loss and 3 mm of retropulsion of the superior posterior margin of the L2 vertebral body.   LABS REVIEWED: TODAY:   05-08-17: wbc 13.7; hgb 12.6; hct 39.3; mcv 90.8; plt 257; glucose  117; bun 28; creat 1.03; k+ 4.1; na++ 138; ca 9.3  05-11-17: wbc 8.3; hgb 10.7; hct 33.3; mcv 92.8; plt 172; glucose 116; bun 14; creat 0.94; k+ 3.; na++ 140; ca 7.9    Review of Systems  Constitutional: Negative for malaise/fatigue.  Respiratory: Negative for cough and shortness of breath.   Cardiovascular: Negative for chest pain, palpitations and leg swelling.  Gastrointestinal: Positive for constipation. Negative for abdominal pain and heartburn.  Musculoskeletal: Positive for back pain. Negative for joint pain and myalgias.  Skin:  Negative.   Neurological: Negative for dizziness.  Psychiatric/Behavioral: The patient is not nervous/anxious.     Physical Exam  Constitutional: She is oriented to person, place, and time. She appears well-developed and well-nourished. No distress.  Obese   Neck: No thyromegaly present.  Cardiovascular: Normal rate, regular rhythm, normal heart sounds and intact distal pulses.  Pulmonary/Chest: Effort normal and breath sounds normal. No respiratory distress.  Abdominal: Soft. Bowel sounds are normal. She exhibits no distension. There is no tenderness.  Musculoskeletal: Normal range of motion. She exhibits no edema.  Has TLSO brace  Lymphadenopathy:    She has no cervical adenopathy.  Neurological: She is alert and oriented to person, place, and time.  Skin: Skin is warm and dry. She is not diaphoretic.  Psychiatric: She has a normal mood and affect.    ASSESSMENT/ PLAN:  TODAY:   1. Essential hypertension, benign: stable b/p 155/73: will continue cozaar 50 mg daily   2. COLD (chronic obstructive lung disease) stable will continue dulera 200-5 mcg 2 puffs daily spiriva respimat: 2.5 mcg 2 puffs daily   3.  Allergic rhinitis: stable will continue singluar 10 mg daily   4. Hypothyroidism due to acquired atrophy of thyroid: stable will continue synthroid 88 mcg daily   5.  GERD without esophagitis: stable will continue prilosec 20 mg twice daily   6. Dyslipidemia: stable will continue crestor 40 mg daily   7. Anxiety disorder: stable will continue celexa 20 mg every other day   8. Closed post traumatic compression fracture of lumbar vertebra: stable will continue therapy as directed  will continue to use TLSO brace; will continue oxycodone 5 mg every 4 hours as needed. We did discuss that over the next couple of weeks she will need to wean off oxycodone. She did verbalized understanding and agreement.   9. Constipation due to opioid therapy: worse; will begin miralax 17 gm daily     Will check cbc; bmp on 05-17-17     MD is aware of resident's narcotic use and is in agreement with current plan of care. We will attempt to wean resident as apropriate   Ok Edwards NP [facility]  Contact [phone] Monday through Friday 8am- 5pm  After hours call [phone]

## 2017-05-15 DIAGNOSIS — T402X5A Adverse effect of other opioids, initial encounter: Secondary | ICD-10-CM | POA: Insufficient documentation

## 2017-05-15 DIAGNOSIS — F419 Anxiety disorder, unspecified: Secondary | ICD-10-CM | POA: Insufficient documentation

## 2017-05-15 DIAGNOSIS — E785 Hyperlipidemia, unspecified: Secondary | ICD-10-CM | POA: Insufficient documentation

## 2017-05-15 DIAGNOSIS — K219 Gastro-esophageal reflux disease without esophagitis: Secondary | ICD-10-CM | POA: Insufficient documentation

## 2017-05-15 DIAGNOSIS — K5903 Drug induced constipation: Secondary | ICD-10-CM | POA: Insufficient documentation

## 2017-05-19 ENCOUNTER — Non-Acute Institutional Stay (SKILLED_NURSING_FACILITY): Payer: Medicare Other | Admitting: Internal Medicine

## 2017-05-19 ENCOUNTER — Encounter: Payer: Self-pay | Admitting: Internal Medicine

## 2017-05-19 DIAGNOSIS — E039 Hypothyroidism, unspecified: Secondary | ICD-10-CM | POA: Diagnosis not present

## 2017-05-19 DIAGNOSIS — F419 Anxiety disorder, unspecified: Secondary | ICD-10-CM | POA: Diagnosis not present

## 2017-05-19 DIAGNOSIS — T402X5A Adverse effect of other opioids, initial encounter: Secondary | ICD-10-CM

## 2017-05-19 DIAGNOSIS — S32000S Wedge compression fracture of unspecified lumbar vertebra, sequela: Secondary | ICD-10-CM

## 2017-05-19 DIAGNOSIS — I10 Essential (primary) hypertension: Secondary | ICD-10-CM

## 2017-05-19 DIAGNOSIS — K5903 Drug induced constipation: Secondary | ICD-10-CM

## 2017-05-19 DIAGNOSIS — J449 Chronic obstructive pulmonary disease, unspecified: Secondary | ICD-10-CM

## 2017-05-19 DIAGNOSIS — K219 Gastro-esophageal reflux disease without esophagitis: Secondary | ICD-10-CM | POA: Diagnosis not present

## 2017-05-19 NOTE — Progress Notes (Signed)
Provider: Veleta Miners  Location:   [facility] Room Number: 506/P Place of Service:  SNF (31)  PCP: Jonathon Resides, MD Patient Care Team: Jonathon Resides, MD as PCP - General (Family Medicine)  Extended Emergency Contact Information Primary Emergency Contact: [place], [place] [address] [place] Phone: [phone] Relation: Son Secondary Emergency Contact: Phillips Odor Address: [place], [place] [address] [place] Phone: [phone] Relation: Daughter  Code Status: Full Code Goals of Care: Advanced Directive information Advanced Directives 05/19/2017  Does Patient Have a Medical Advance Directive? Yes  Type of Advance Directive (No Data)  Does patient want to make changes to medical advance directive? No - Patient declined  Copy of [place] in Chart? -  Would patient like information on creating a medical advance directive? No - Patient declined      Chief Complaint  Patient presents with  . New Admit To SNF    New Admission Visit    HPI: Patient is a 77 y.o. female seen today for admission to SNF for therapy After staying in the hospital 03/30-04/03 acute fracture of L1 and  compression fracture of L2 Patient is a very pleasant female with history of hypertension, hypothyroidism, COPD,GERD and depression Patient had a mechanical fall when she fell from her chair and hit her buttocks on the floor . CT revealed the lumbar spine compression fracture.  Neurosurgery recommended TLSO brace, pain management and no surgical intervention at this time. She was sent to SNF for pain management and therapy.  Patient's pain has been controlled on oxycodone.  She is able to walk with a walker. Her main complaint today was constipation.  She denied any abdominal pain nausea vomiting has good appetite. Patient lives by herself but has supportive  daughter who lives close by.  Patient wants to go home with   Past Medical History:  Diagnosis Date  . Actinic keratosis   . Arthritis   . Asthma   . Basal cell carcinoma   . COPD (chronic obstructive pulmonary disease) ([place])   . GERD (gastroesophageal reflux disease)   . Heart murmur    hx of in childhood   . Hyperlipidemia   . Hypertension   . Hypothyroidism   . MCNS (minimal change nephrotic syndrome)   . Osteopenia   . Shortness of breath dyspnea    on exertion   . Squamous cell carcinoma    Past Surgical History:  Procedure Laterality Date  . ABDOMINAL HYSTERECTOMY    . APPENDECTOMY    . BILATERAL SALPINGOOPHORECTOMY     Ovarian Cysts   . 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 MEDIAL PARTIAL KNEE REPLACEMENT Bilateral   . MOHS SURGERY     x 2  . RENAL BIOPSY     x 2  . ROTATOR CUFF REPAIR Left     reports that she quit smoking about 44 years ago. Her smoking use included cigarettes. She has a 30.00 pack-year smoking history. She has never used smokeless tobacco. She reports that she drinks alcohol. She reports that she does not use drugs. Social History   Socioeconomic History  . Marital status: Widowed  Spouse name: Not on file  . Number of children: 2  . Years of education: 33  . Highest education level: Not on file  Occupational History  . 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 Financial resource strain: Not on file  . Food insecurity:    Worry: Not on file    Inability: Not on file  . Transportation needs:    Medical: Not on file    Non-medical: Not on file  Tobacco Use  . Smoking status: Former Smoker    Packs/day: 1.50    Years: 20.00    Pack years: 30.00    Types: Cigarettes    Last attempt to quit: 01/12/1973    Years since  quitting: 44.3  . Smokeless tobacco: Never Used  Substance and Sexual Activity  . Alcohol use: Yes    Alcohol/week: 0.0 oz    Types: 1 Standard drinks or equivalent per week    Comment: She occasionally drinks a glass of wine.    . Drug use: No  . Sexual activity: Not Currently    Partners: Male  Lifestyle  . Physical activity:    Days per week: Not on file    Minutes per session: Not on file  . Stress: Not on file  Relationships  . Social connections:    Talks on phone: Not on file    Gets together: Not on file    Attends religious service: Not on file    Active member of club or organization: Not on file    Attends meetings of clubs or organizations: Not on file    Relationship status: Not on file  . Intimate partner violence:    Fear of current or ex partner: Not on file    Emotionally abused: Not on file    Physically abused: Not on file    Forced sexual activity: Not on file  Other Topics Concern  . Not on file  Social History Narrative   Marital Status:  Widowed      Siblings: Kitty Simmons/ Fernanda Drum   Children:  2;  Grandchildren:  5    Pets: None   Living Situation: Lives alone   Occupation: Retired Pharmacist, hospital   Education: Forensic psychologist    Tobacco Use/Exposure:  She quit smoking > 20 years ago after having smoked 1 1/2 ppd for over 20 years.     Alcohol Use:  Occasional (Wine)    Drug Use:  None   Diet:  Regular   Exercise:  Water Aerobic Class 3 days a week and Weights   Hobbies: Reading/ Crafts/ Biking/ Reading]    Functional Status Survey:    Family History  Problem Relation Age of Onset  . Lymphoma Father   . Heart disease Paternal Grandfather   . COPD Mother        Husband Smoked   . Stroke Maternal Grandfather   . Rheumatic fever Paternal Grandmother   . Hypertension Sister   . Scoliosis Daughter     Health Maintenance  Topic Date Due  . INFLUENZA VACCINE  09/09/2017  . TETANUS/TDAP  12/08/2020  . DEXA SCAN  Completed  . PNA vac Low  Risk Adult  Completed    No Known Allergies  Allergies as of 05/19/2017   No Known Allergies     Medication List        Accurate as of 05/19/17  9:20 AM. Always use  your most recent med list.          acetaminophen 500 MG tablet Commonly known as:  TYLENOL Take 2 tablets (1,000 mg total) by mouth every 6 (six) hours as needed for mild pain.   albuterol 108 (90 Base) MCG/ACT inhaler Commonly known as:  PROAIR HFA Inhale 2 puffs into the lungs every 6 (six) hours as needed for wheezing or shortness of breath.   bisacodyl 10 MG suppository Commonly known as:  DULCOLAX Give 10mg  rectally x 1 dose in 24 hours as needed in not relieved by MOM   Calcium-Vitamin D 600-400 MG-UNIT Tabs Take 1 tablet by mouth 2 (two) times daily.   cholecalciferol 1000 units tablet Commonly known as:  VITAMIN D Take 1,000 Units by mouth 2 (two) times daily.   citalopram 20 MG tablet Commonly known as:  CELEXA TAKE 1 TABLET BY MOUTH EVERY DAY AT NIGHT   diphenhydrAMINE 25 MG tablet Commonly known as:  BENADRYL Take 25 mg by mouth daily.   DULERA 200-5 MCG/ACT Aero Generic drug:  mometasone-formoterol Inhale 2 puffs into the lungs daily.   levothyroxine 88 MCG tablet Commonly known as:  SYNTHROID, LEVOTHROID Take 88 mcg by mouth every morning.   losartan 50 MG tablet Commonly known as:  COZAAR Take 50 mg by mouth daily.   MILK OF MAGNESIA PO Give 30 cc by mouth x 1 dose in 24 hours as needed if no BM in 3 days   montelukast 10 MG tablet Commonly known as:  SINGULAIR TAKE 1 TABLET BY MOUTH EVERY DAY AT NIGHT   multivitamin with minerals Tabs tablet Take 1 tablet by mouth 2 (two) times daily.   omeprazole 20 MG capsule Commonly known as:  PRILOSEC TAKE 1 CAPSULE (20 MG TOTAL) BY MOUTH TWO (2) TIMES A DAY.   oxyCODONE 5 MG immediate release tablet Commonly known as:  Oxy IR/ROXICODONE Take 5 mg by mouth every 4 (four) hours as needed for severe pain. Stop date 05/28/2017     polyethylene glycol packet Commonly known as:  MIRALAX / GLYCOLAX Take 17 g by mouth daily.   rosuvastatin 40 MG tablet Commonly known as:  CRESTOR Take 20 mg by mouth daily. MWF   SPIRIVA RESPIMAT 2.5 MCG/ACT Aers Generic drug:  Tiotropium Bromide Monohydrate Take 2 puffs by mouth daily.       Review of Systems  Review of Systems  Constitutional: Negative for activity change, appetite change, chills, diaphoresis, fatigue and fever.  HENT: Negative for mouth sores, postnasal drip, rhinorrhea, sinus pain and sore throat.   Respiratory: Negative for apnea, cough, chest tightness, shortness of breath and wheezing.   Cardiovascular: Negative for chest pain, palpitations and leg swelling.  Gastrointestinal: Negative for abdominal distention, abdominal pain,  diarrhea, nausea and vomiting.  Genitourinary: Negative for dysuria and frequency.  Musculoskeletal: Negative for arthralgias, joint swelling and myalgias.  Skin: Negative for rash.  Neurological: Negative for dizziness, syncope, weakness, light-headedness and numbness.  Psychiatric/Behavioral: Negative for behavioral problems, confusion and sleep disturbance.     Vitals:   05/19/17 0907  BP: (!) 141/70  Pulse: 72  Resp: 20  Temp: 98.2 F (36.8 C)  TempSrc: Oral  SpO2: 92%   There is no height or weight on file to calculate BMI. Physical Exam  Constitutional: She is oriented to person, place, and time. She appears well-developed and well-nourished.  HENT:  Head: Normocephalic.  Mouth/Throat: Oropharynx is clear and moist.  Eyes: Pupils are equal, round, and reactive to  light.  Neck: Normal range of motion. Neck supple.  Cardiovascular: Normal rate, regular rhythm and normal heart sounds.  Pulmonary/Chest: Effort normal. No respiratory distress. She has no wheezes. She has no rales.  Abdominal: Soft. Bowel sounds are normal. She exhibits no distension. There is no tenderness. There is no guarding.  Musculoskeletal:  She exhibits no edema.  Lymphadenopathy:    She has no cervical adenopathy.  Neurological: She is alert and oriented to person, place, and time.  No Focal Deficits  Skin: Skin is warm and dry.  Psychiatric: She has a normal mood and affect. Her behavior is normal. Thought content normal.  Nursing note and vitals reviewed.   Labs reviewed: Basic Metabolic Panel: Recent Labs    05/08/17 1713 05/09/17 0457 05/11/17 0542  NA 138 138 140  K 4.1 3.7 3.8  CL 101 102 107  CO2 28 25 27   GLUCOSE 117* 111* 116*  BUN 16 15 14   CREATININE 1.03* 1.02* 0.94  CALCIUM 9.3 8.9 7.9*   Liver Function Tests: No results for input(s): AST, ALT, ALKPHOS, BILITOT, PROT, ALBUMIN in the last 8760 hours. No results for input(s): LIPASE, AMYLASE in the last 8760 hours. No results for input(s): AMMONIA in the last 8760 hours. CBC: Recent Labs    05/08/17 1713 05/09/17 0457 05/10/17 0531 05/11/17 0542  WBC 13.7* 8.7 11.2* 8.3  NEUTROABS 11.5*  --   --   --   HGB 12.6 12.2 12.2 10.7*  HCT 39.3 38.1 38.6 33.3*  MCV 90.8 91.4 93.5 92.8  PLT 257 220 204 172   Cardiac Enzymes: No results for input(s): CKTOTAL, CKMB, CKMBINDEX, TROPONINI in the last 8760 hours. BNP: Invalid input(s): POCBNP Lab Results  Component Value Date   HGBA1C 5.6 07/17/2013   Lab Results  Component Value Date   TSH 0.601 06/12/2013   No results found for: VITAMINB12 No results found for: FOLATE No results found for: IRON, TIBC, FERRITIN  Imaging and Procedures obtained prior to SNF admission: Dg Lumbar Spine Complete  Result Date: 05/08/2017 CLINICAL DATA:  Low back and left leg pain after fall. EXAM: LUMBAR SPINE - COMPLETE 4+ VIEW COMPARISON:  None. FINDINGS: Moderate compression deformity of L1 vertebral body is noted consistent with acute fracture. No spondylolisthesis is noted. Moderate degenerative disc disease is noted at L4-5 and L5-S1. IMPRESSION: Probable acute compression fracture of L1 vertebral body. CT  scan may be performed for further evaluation. Electronically Signed   By: Marijo Conception, M.D.   On: 05/08/2017 15:29   Dg Tibia/fibula Left  Result Date: 05/08/2017 CLINICAL DATA:  Fall.  Swelling about anterior tibia. EXAM: LEFT TIBIA AND FIBULA - 2 VIEW COMPARISON:  None. FINDINGS: Left medial hemiarthroplasty. Osteopenia. No acute fracture or dislocation. Moderate degenerate changes of the patellofemoral articulation of the knee. No knee joint effusion. Small Achilles and calcaneal spurs. IMPRESSION: Postoperative and degenerative changes.  No acute findings. Electronically Signed   By: Abigail Miyamoto M.D.   On: 05/08/2017 15:26   Ct Head Wo Contrast  Result Date: 05/08/2017 CLINICAL DATA:  Fall.  Head injury. EXAM: CT HEAD WITHOUT CONTRAST TECHNIQUE: Contiguous axial images were obtained from the base of the skull through the vertex without intravenous contrast. COMPARISON:  None. FINDINGS: Brain: cerebral atrophy with resultant prominence of the extra-axial spaces adjacent the frontal lobes. No mass lesion, hemorrhage, hydrocephalus, acute infarct, intra-axial, or extra-axial fluid collection. Vascular: Intracranial atherosclerosis. Skull: Hyperostosis frontalis interna. No displaced fractures or significant soft tissue swelling. Sinuses/Orbits:  Normal imaged portions of the orbits and globes. Clear paranasal sinuses and mastoid air cells. Other: None. IMPRESSION: 1.  No acute intracranial abnormality. 2. Cerebral atrophy. Electronically Signed   By: Abigail Miyamoto M.D.   On: 05/08/2017 15:29   Ct Lumbar Spine Wo Contrast  Result Date: 05/08/2017 CLINICAL DATA:  Low back pain status post fall. EXAM: CT LUMBAR SPINE WITHOUT CONTRAST TECHNIQUE: Multidetector CT imaging of the lumbar spine was performed without intravenous contrast administration. Multiplanar CT image reconstructions were also generated. COMPARISON:  None. FINDINGS: Segmentation: 5 lumbar type vertebrae. Alignment: Normal. Vertebrae: L1  vertebral body compression fracture with approximately 50% height loss and 6 mm retropulsion of the superior posterior margin of the L1 vertebral body mildly impressing on the spinal canal resulting in mild spinal stenosis. L2 vertebral body compression fracture without significant height loss and 3 mm of retropulsion of the superior posterior margin of the L2 vertebral body without significant impression on the thecal sac. Remainder the vertebral body heights are maintained. No aggressive osseous lesion. Paraspinal and other soft tissues: No acute paraspinal abnormality. Abdominal aortic atherosclerosis. Disc levels: Degenerative disc disease with disc height loss at L5-S1 and to lesser extent L4-5. moderate bilateral facet arthropathy at L1-2. Broad-based disc bulge at L2-3 with mild bilateral facet arthropathy. Mild broad-based disc bulge and bilateral facet arthropathy at L3-4 and L4-5. IMPRESSION: 1. Acute L1 vertebral body compression fracture with approximately 50% height loss and 6 mm of retropulsion of the superior posterior margin of the L1 vertebral body mildly impressing on the spinal canal. 2. Acute L2 vertebral body compression fracture without significant height loss and 3 mm of retropulsion of the superior posterior margin of the L2 vertebral body. Electronically Signed   By: Kathreen Devoid   On: 05/08/2017 16:36    Assessment/Plan Compression fracture of lumbar vertebrae We will continue oxycodone as needed Continue to use TLSO brace Is working with therapy and doing very well. Patient was likely would be able to discharge next week,  Hypothyroidism Continue on Synthroid   Essential hypertension,  Patient was mildly elevated We will continue Cozaar for now   COPD  Patient is stable on Dulera and Spiriva  GERD without esophagitis She is not having any symptoms  continue Prilosec  Constipation due to opioid therapy She was started on MiraLAX couple of days ago.   Will increase  it to twice a day for a few days  Anxiety disorder, Continue on Celexa And Benadryl for Insomnia  Hyperlipidemia Continue on Crestor    Family/ staff Communication:  Total time spent in this patient care encounter was _45 minutes; greater than 50% of the visit spent counseling patient, reviewing records , Labs and coordinating care for problems addressed at this encounter.   Labs/tests ordered:

## 2017-05-21 ENCOUNTER — Encounter: Payer: Self-pay | Admitting: Internal Medicine

## 2017-05-21 NOTE — Progress Notes (Signed)
Location:   [place] Room Number: 272/Z Place of Service:  SNF [id number]) Provider:  Arnetha Massy, MD  Patient Care Team: Jonathon Resides, MD as PCP - [facility] Medicine)  Extended Emergency Contact Information Primary Emergency Contact: [place], [place] [address] [place] Phone: [phone] Relation: Son Secondary Emergency Contact: Phillips Odor Address: [place], [place] [address] [place] Phone: [phone] Relation: Daughter  Code Status:  Full Code Goals of care: Advanced Directive information Advanced Directives 05/21/2017  Does Patient Have a Medical Advance Directive? Yes  Type of Advance Directive (No Data)  Does patient want to make changes to medical advance directive? No - Patient declined  Copy of [place] in Chart? -  Would patient like information on creating a medical advance directive? No - Patient declined     Chief Complaint  Patient presents with  . Acute Visit    Patients c/o Constipation    HPI:  Pt is a 77 y.o. female seen today for an acute visit for    Past Medical History:  Diagnosis Date  . Actinic keratosis   . Arthritis   . Asthma   . Basal cell carcinoma   . COPD (chronic obstructive pulmonary disease) ([place])   . GERD (gastroesophageal reflux disease)   . Heart murmur    hx of in childhood   . Hyperlipidemia   . Hypertension   . Hypothyroidism   . MCNS (minimal change nephrotic syndrome)   . Osteopenia   . Shortness of breath dyspnea    on exertion   . Squamous cell carcinoma    Past Surgical History:  Procedure Laterality Date  . ABDOMINAL HYSTERECTOMY    . APPENDECTOMY    . BILATERAL SALPINGOOPHORECTOMY     Ovarian Cysts   . CONVERSION TO TOTAL KNEE Right 07/10/2014   Procedure: RIGHT CONVERSION OF PARTIAL KNEE TO TOTAL KNEE;  Surgeon: Paralee Cancel, MD;  Location: [facility]  ORS;  Service: Orthopedics;  Laterality: Right;  . MEDIAL PARTIAL KNEE REPLACEMENT Bilateral   . MOHS SURGERY     x 2  . RENAL BIOPSY     x 2  . ROTATOR CUFF REPAIR Left     No Known Allergies  Outpatient Encounter Medications as of 05/21/2017  Medication Sig  . acetaminophen (TYLENOL) 500 MG tablet Take 2 tablets (1,000 mg total) by mouth every 6 (six) hours as needed for mild pain.  . bisacodyl (DULCOLAX) 10 MG suppository Give 10mg  rectally x 1 dose in 24 hours as needed in not relieved by MOM  . Calcium Carb-Cholecalciferol (CALCIUM-VITAMIN D) 600-400 MG-UNIT TABS Take 1 tablet by mouth 2 (two) times daily.  . cholecalciferol (VITAMIN D) 1000 units tablet Take 1,000 Units by mouth 2 (two) times daily.  . citalopram (CELEXA) 20 MG tablet TAKE 1 TABLET BY MOUTH EVERY DAY AT NIGHT  . diphenhydrAMINE (BENADRYL) 25 MG tablet Take 25 mg by mouth daily.  Marland Kitchen levothyroxine (SYNTHROID, LEVOTHROID) 88 MCG tablet Take 88 mcg by mouth every morning.  Marland Kitchen losartan (COZAAR) 50 MG tablet Take 50 mg by mouth daily.  . Magnesium Hydroxide (MILK OF MAGNESIA PO) Give 30 cc by mouth x 1 dose in 24 hours as needed if no BM in 3 days  . montelukast (SINGULAIR) 10 MG tablet  TAKE 1 TABLET BY MOUTH EVERY DAY AT NIGHT  . Multiple Vitamin (MULTIVITAMIN WITH MINERALS) TABS tablet Take 1 tablet by mouth 2 (two) times daily.  Marland Kitchen omeprazole (PRILOSEC) 20 MG capsule TAKE 1 CAPSULE (20 MG TOTAL) BY MOUTH TWO (2) TIMES A DAY.  Marland Kitchen oxyCODONE (OXY IR/ROXICODONE) 5 MG immediate release tablet Take 5 mg by mouth every 4 (four) hours as needed for severe pain. Stop date 05/28/2017  . polyethylene glycol (MIRALAX / GLYCOLAX) packet Take 17 g by mouth daily.  . rosuvastatin (CRESTOR) 40 MG tablet Take 20 mg by mouth daily. MWF  . Tiotropium Bromide Monohydrate (SPIRIVA RESPIMAT) 2.5 MCG/ACT AERS Inhale two puffs by mouth once a day for COPD Rinse mouth after using the inhaler  . [DISCONTINUED] albuterol (PROAIR HFA) 108 (90 BASE)  MCG/ACT inhaler Inhale 2 puffs into the lungs every 6 (six) hours as needed for wheezing or shortness of breath.  . [DISCONTINUED] DULERA 200-5 MCG/ACT AERO Inhale 2 puffs into the lungs daily.   . [DISCONTINUED] SPIRIVA RESPIMAT 2.5 MCG/ACT AERS Take 2 puffs by mouth daily.    No facility-administered encounter medications on file as of 05/21/2017.     Review of Systems  Immunization History  Administered Date(s) Administered  . Influenza-Unspecified 11/09/2013, 10/08/2014, 11/10/2015, 10/09/2016  . Pneumococcal Conjugate-13 12/26/2014, 04/21/2017  . Pneumococcal Polysaccharide-23 02/10/2011  . Tdap 12/09/2010  . Zoster 02/16/2008  . Zoster Recombinat (Shingrix) 04/21/2017   Pertinent  Health Maintenance Due  Topic Date Due  . INFLUENZA VACCINE  09/09/2017  . DEXA SCAN  Completed  . PNA vac Low Risk Adult  Completed   Fall Risk  12/12/2012  Falls in the past year? No   Functional Status Survey:    Vitals:   05/21/17 0935  BP: 140/80  Pulse: 64  Resp: 18  Temp: (!) 97.3 F (36.3 C)  TempSrc: Oral   There is no height or weight on file to calculate BMI. Physical Exam  Labs reviewed: Recent Labs    05/08/17 1713 05/09/17 0457 05/11/17 0542  NA 138 138 140  K 4.1 3.7 3.8  CL 101 102 107  CO2 28 25 27   GLUCOSE 117* 111* 116*  BUN 16 15 14   CREATININE 1.03* 1.02* 0.94  CALCIUM 9.3 8.9 7.9*   No results for input(s): AST, ALT, ALKPHOS, BILITOT, PROT, ALBUMIN in the last 8760 hours. Recent Labs    05/08/17 1713 05/09/17 0457 05/10/17 0531 05/11/17 0542  WBC 13.7* 8.7 11.2* 8.3  NEUTROABS 11.5*  --   --   --   HGB 12.6 12.2 12.2 10.7*  HCT 39.3 38.1 38.6 33.3*  MCV 90.8 91.4 93.5 92.8  PLT 257 220 204 172   Lab Results  Component Value Date   TSH 0.601 06/12/2013   Lab Results  Component Value Date   HGBA1C 5.6 07/17/2013   Lab Results  Component Value Date   CHOL 177 06/12/2013   HDL 78 06/12/2013   LDLCALC 75 06/12/2013   TRIG 121 06/12/2013    CHOLHDL 2.3 06/12/2013    Significant Diagnostic Results in last 30 days:  Dg Lumbar Spine Complete  Result Date: 05/08/2017 CLINICAL DATA:  Low back and left leg pain after fall. EXAM: LUMBAR SPINE - COMPLETE 4+ VIEW COMPARISON:  None. FINDINGS: Moderate compression deformity of L1 vertebral body is noted consistent with acute fracture. No spondylolisthesis is noted. Moderate degenerative disc disease is noted at L4-5 and L5-S1. IMPRESSION: Probable acute compression fracture of L1 vertebral body.  CT scan may be performed for further evaluation. Electronically Signed   By: Marijo Conception, M.D.   On: 05/08/2017 15:29   Dg Tibia/fibula Left  Result Date: 05/08/2017 CLINICAL DATA:  Fall.  Swelling about anterior tibia. EXAM: LEFT TIBIA AND FIBULA - 2 VIEW COMPARISON:  None. FINDINGS: Left medial hemiarthroplasty. Osteopenia. No acute fracture or dislocation. Moderate degenerate changes of the patellofemoral articulation of the knee. No knee joint effusion. Small Achilles and calcaneal spurs. IMPRESSION: Postoperative and degenerative changes.  No acute findings. Electronically Signed   By: Abigail Miyamoto M.D.   On: 05/08/2017 15:26   Ct Head Wo Contrast  Result Date: 05/08/2017 CLINICAL DATA:  Fall.  Head injury. EXAM: CT HEAD WITHOUT CONTRAST TECHNIQUE: Contiguous axial images were obtained from the base of the skull through the vertex without intravenous contrast. COMPARISON:  None. FINDINGS: Brain: cerebral atrophy with resultant prominence of the extra-axial spaces adjacent the frontal lobes. No mass lesion, hemorrhage, hydrocephalus, acute infarct, intra-axial, or extra-axial fluid collection. Vascular: Intracranial atherosclerosis. Skull: Hyperostosis frontalis interna. No displaced fractures or significant soft tissue swelling. Sinuses/Orbits: Normal imaged portions of the orbits and globes. Clear paranasal sinuses and mastoid air cells. Other: None. IMPRESSION: 1.  No acute intracranial  abnormality. 2. Cerebral atrophy. Electronically Signed   By: Abigail Miyamoto M.D.   On: 05/08/2017 15:29   Ct Lumbar Spine Wo Contrast  Result Date: 05/08/2017 CLINICAL DATA:  Low back pain status post fall. EXAM: CT LUMBAR SPINE WITHOUT CONTRAST TECHNIQUE: Multidetector CT imaging of the lumbar spine was performed without intravenous contrast administration. Multiplanar CT image reconstructions were also generated. COMPARISON:  None. FINDINGS: Segmentation: 5 lumbar type vertebrae. Alignment: Normal. Vertebrae: L1 vertebral body compression fracture with approximately 50% height loss and 6 mm retropulsion of the superior posterior margin of the L1 vertebral body mildly impressing on the spinal canal resulting in mild spinal stenosis. L2 vertebral body compression fracture without significant height loss and 3 mm of retropulsion of the superior posterior margin of the L2 vertebral body without significant impression on the thecal sac. Remainder the vertebral body heights are maintained. No aggressive osseous lesion. Paraspinal and other soft tissues: No acute paraspinal abnormality. Abdominal aortic atherosclerosis. Disc levels: Degenerative disc disease with disc height loss at L5-S1 and to lesser extent L4-5. moderate bilateral facet arthropathy at L1-2. Broad-based disc bulge at L2-3 with mild bilateral facet arthropathy. Mild broad-based disc bulge and bilateral facet arthropathy at L3-4 and L4-5. IMPRESSION: 1. Acute L1 vertebral body compression fracture with approximately 50% height loss and 6 mm of retropulsion of the superior posterior margin of the L1 vertebral body mildly impressing on the spinal canal. 2. Acute L2 vertebral body compression fracture without significant height loss and 3 mm of retropulsion of the superior posterior margin of the L2 vertebral body. Electronically Signed   By: Kathreen Devoid   On: 05/08/2017 16:36    Assessment/Plan There are no diagnoses linked to this  encounter.      Oralia Manis, R[place]

## 2017-05-23 NOTE — Progress Notes (Signed)
This encounter was created in error - please disregard.

## 2017-05-31 ENCOUNTER — Encounter: Payer: Self-pay | Admitting: Internal Medicine

## 2017-05-31 ENCOUNTER — Non-Acute Institutional Stay (SKILLED_NURSING_FACILITY): Payer: Medicare Other | Admitting: Internal Medicine

## 2017-05-31 DIAGNOSIS — F419 Anxiety disorder, unspecified: Secondary | ICD-10-CM

## 2017-05-31 DIAGNOSIS — T402X5A Adverse effect of other opioids, initial encounter: Secondary | ICD-10-CM | POA: Diagnosis not present

## 2017-05-31 DIAGNOSIS — E034 Atrophy of thyroid (acquired): Secondary | ICD-10-CM | POA: Diagnosis not present

## 2017-05-31 DIAGNOSIS — K5903 Drug induced constipation: Secondary | ICD-10-CM

## 2017-05-31 DIAGNOSIS — E785 Hyperlipidemia, unspecified: Secondary | ICD-10-CM | POA: Diagnosis not present

## 2017-05-31 DIAGNOSIS — J449 Chronic obstructive pulmonary disease, unspecified: Secondary | ICD-10-CM

## 2017-05-31 DIAGNOSIS — K219 Gastro-esophageal reflux disease without esophagitis: Secondary | ICD-10-CM

## 2017-05-31 DIAGNOSIS — I10 Essential (primary) hypertension: Secondary | ICD-10-CM

## 2017-05-31 DIAGNOSIS — S32000S Wedge compression fracture of unspecified lumbar vertebra, sequela: Secondary | ICD-10-CM | POA: Diagnosis not present

## 2017-05-31 NOTE — Progress Notes (Signed)
Location:  [facility] Service:  SNF (31)Skilled nursing facility Provider: Hennie Duos MD   PCP: Jonathon Resides, MD Patient Care Team: Jonathon Resides, MD as PCP - [facility] Medicine)  Extended Emergency Contact Information Primary Emergency Contact: [place], [place] [address] [place] Phone: [phone] Relation: Son Secondary Emergency Contact: Phillips Odor Address: [place], [place] [address] [place] Phone: [phone] Relation: Daughter  No Known Allergies  Chief Complaint  Patient presents with  . Discharge Note    HPI:  77 y.o. female with hypertension, hypothyroidism, COPD, GERD, and depression who sustained a mechanical fall at home and sustaining an acute fracture of L1 and a compression fracture of L2 T scan.  Neurosurgery recommended TLSO brace and pain management and no cervical intervention.  Patient was admitted to skilled nursing facility for pain management and therapy and patient is now ready to be discharged to home.    Past Medical History:  Diagnosis Date  . Actinic keratosis   . Arthritis   . Asthma   . Basal cell carcinoma   . COPD (chronic obstructive pulmonary disease) ([place])   . GERD (gastroesophageal reflux disease)   . Heart murmur    hx of in childhood   . Hyperlipidemia   . Hypertension   . Hypothyroidism   . MCNS (minimal change nephrotic syndrome)   . Osteopenia   . Shortness of breath dyspnea    on exertion   . Squamous cell carcinoma     Past Surgical History:  Procedure Laterality Date  . ABDOMINAL HYSTERECTOMY    . APPENDECTOMY    . BILATERAL SALPINGOOPHORECTOMY     Ovarian Cysts   . 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 MEDIAL PARTIAL KNEE  REPLACEMENT Bilateral   . MOHS SURGERY     x 2  . RENAL BIOPSY     x 2  . ROTATOR CUFF REPAIR Left      reports that she quit smoking about 44 years ago. Her smoking use included cigarettes. She has a 30.00 pack-year smoking history. She has never used smokeless tobacco. She reports that she drinks alcohol. She reports that she does not use drugs. Social History   Socioeconomic History  . Marital status: Widowed    Spouse name: Not on file  . Number of children: 2  . Years of education: 48  . Highest education level: Not on file  Occupational History  . 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 Financial resource strain: Not on file  . Food insecurity:    Worry: Not on file    Inability: Not on file  . Transportation needs:    Medical: Not on file    Non-medical: Not on file  Tobacco Use  . Smoking status: Former Smoker    Packs/day: 1.50    Years: 20.00    Pack years: 30.00    Types: Cigarettes    Last attempt to quit: 01/12/1973    Years since quitting: 44.4  . Smokeless tobacco: Never Used  Substance and Sexual Activity  .  Alcohol use: Yes    Alcohol/week: 0.0 oz    Types: 1 Standard drinks or equivalent per week    Comment: She occasionally drinks a glass of wine.    . Drug use: No  . Sexual activity: Not Currently    Partners: Male  Lifestyle  . Physical activity:    Days per week: Not on file    Minutes per session: Not on file  . Stress: Not on file  Relationships  . Social connections:    Talks on phone: Not on file    Gets together: Not on file    Attends religious service: Not on file    Active member of club or organization: Not on file    Attends meetings of clubs or organizations: Not on file    Relationship status: Not on file  . Intimate partner violence:    Fear of current or ex partner: Not on file    Emotionally abused: Not on file    Physically abused: Not on file    Forced sexual  activity: Not on file  Other Topics Concern  . Not on file  Social History Narrative   Marital Status:  Widowed      Siblings: Kitty Simmons/ Fernanda Drum   Children:  2;  Grandchildren:  5    Pets: None   Living Situation: Lives alone   Occupation: Retired Pharmacist, hospital   Education: Forensic psychologist    Tobacco Use/Exposure:  She quit smoking > 20 years ago after having smoked 1 1/2 ppd for over 20 years.     Alcohol Use:  Occasional (Wine)    Drug Use:  None   Diet:  Regular   Exercise:  Water Aerobic Class 3 days a week and Weights   Hobbies: Reading/ Crafts/ Biking/ Reading]    Pertinent  Health Maintenance Due  Topic Date Due  . INFLUENZA VACCINE  09/09/2017  . DEXA SCAN  Completed  . PNA vac Low Risk Adult  Completed    Medications: Allergies as of 05/31/2017   No Known Allergies     Medication List        Accurate as of 05/31/17 11:59 PM. Always use your most recent med list.          acetaminophen 500 MG tablet Commonly known as:  TYLENOL Take 2 tablets (1,000 mg total) by mouth every 6 (six) hours as needed for mild pain.   Calcium-Vitamin D 600-400 MG-UNIT Tabs Take 1 tablet by mouth daily.   cholecalciferol 1000 units tablet Commonly known as:  VITAMIN D Take 1,000 Units by mouth 2 (two) times daily.   citalopram 20 MG tablet Commonly known as:  CELEXA TAKE 1 TABLET BY MOUTH EVERY DAY AT NIGHT   diphenhydrAMINE 25 MG tablet Commonly known as:  BENADRYL Take 25 mg by mouth daily.   levothyroxine 88 MCG tablet Commonly known as:  SYNTHROID, LEVOTHROID Take 88 mcg by mouth every morning.   losartan 50 MG tablet Commonly known as:  COZAAR Take 50 mg by mouth daily.   MILK OF MAGNESIA PO Give 30 cc by mouth x 1 dose in 24 hours as needed if no BM in 3 days   montelukast 10 MG tablet Commonly known as:  SINGULAIR TAKE 1 TABLET BY MOUTH EVERY DAY AT NIGHT   multivitamin with minerals Tabs tablet Take 1 tablet by mouth 2 (two) times daily.     omeprazole 20 MG capsule Commonly known as:  PRILOSEC TAKE 1 CAPSULE (  20 MG TOTAL) BY MOUTH TWO (2) TIMES A DAY.   oxyCODONE 5 MG immediate release tablet Commonly known as:  Oxy IR/ROXICODONE Take 5 mg by mouth every 6 (six) hours as needed for severe pain. 7 day supply to go home with   polyethylene glycol packet Commonly known as:  MIRALAX / GLYCOLAX Take 17 g by mouth daily.   rosuvastatin 40 MG tablet Commonly known as:  CRESTOR Take 20 mg by mouth daily. MWF   SPIRIVA RESPIMAT 2.5 MCG/ACT Aers Generic drug:  Tiotropium Bromide Monohydrate Inhale two puffs by mouth once a day for COPD Rinse mouth after using the inhaler        Vitals:   05/31/17 1651  BP: 138/84  Pulse: 71  Resp: 18  Temp: 97.6 F (36.4 C)  SpO2: (!) 5%   There is no height or weight on file to calculate BMI.  Physical Exam  GENERAL APPEARANCE: Alert, conversant. No acute distress.  HEENT: Unremarkable. RESPIRATORY: Breathing is even, unlabored. Lung sounds are clear   CARDIOVASCULAR: Heart RRR no murmurs, rubs or gallops. No peripheral edema.  GASTROINTESTINAL: Abdomen is soft, non-tender, not distended w/ normal bowel sounds.  NEUROLOGIC: Cranial nerves 2-12 grossly intact. Moves all extremities   Labs reviewed: Basic Metabolic Panel: Recent Labs    05/08/17 1713 05/09/17 0457 05/11/17 0542  NA 138 138 140  K 4.1 3.7 3.8  CL 101 102 107  CO2 28 25 27   GLUCOSE 117* 111* 116*  BUN 16 15 14   CREATININE 1.03* 1.02* 0.94  CALCIUM 9.3 8.9 7.9*   Lab Results  Component Value Date   MICROALBUR 0.50 07/07/2012   Liver Function Tests: No results for input(s): AST, ALT, ALKPHOS, BILITOT, PROT, ALBUMIN in the last 8760 hours. No results for input(s): LIPASE, AMYLASE in the last 8760 hours. No results for input(s): AMMONIA in the last 8760 hours. CBC: Recent Labs    05/08/17 1713 05/09/17 0457 05/10/17 0531 05/11/17 0542  WBC 13.7* 8.7 11.2* 8.3  NEUTROABS 11.5*  --   --   --    HGB 12.6 12.2 12.2 10.7*  HCT 39.3 38.1 38.6 33.3*  MCV 90.8 91.4 93.5 92.8  PLT 257 220 204 172   Lipid No results for input(s): CHOL, HDL, LDLCALC, TRIG in the last 8760 hours. Cardiac Enzymes: No results for input(s): CKTOTAL, CKMB, CKMBINDEX, TROPONINI in the last 8760 hours. BNP: No results for input(s): BNP in the last 8760 hours. CBG: No results for input(s): GLUCAP in the last 8760 hours.  Procedures and Imaging Studies During Stay: No results found.  Assessment/Plan:   Closed post-traumatic compression fracture of lumbar vertebra, sequela  Hypothyroidism due to acquired atrophy of thyroid  Essential hypertension, benign  Dyslipidemia  Chronic obstructive pulmonary disease, unspecified COPD type (HCC)  Anxiety disorder, unspecified type  Constipation due to opioid therapy  GERD without esophagitis   Patient is being discharged with the following home health services: OT/PT  Patient is being discharged with the following durable medical equipment: [place]  Patient has been advised to f/u with their PCP in 1-2 weeks to bring them up to date on their rehab stay.  Social services at facility was responsible for arranging this appointment.  Pt was provided with a 30 day supply of prescriptions for medications and refills must be obtained from their PCP.  For controlled substances, a more limited supply may be provided adequate until PCP appointment only.  Medications have been reconciled  Time spent greater  than 30 minutes;> 50% of time with patient was spent reviewing records, labs, tests and studies, counseling and developing plan of care  Inocencio Homes, MD

## 2017-06-02 DIAGNOSIS — S32018D Other fracture of first lumbar vertebra, subsequent encounter for fracture with routine healing: Secondary | ICD-10-CM | POA: Diagnosis not present

## 2017-06-02 DIAGNOSIS — S32028D Other fracture of second lumbar vertebra, subsequent encounter for fracture with routine healing: Secondary | ICD-10-CM | POA: Diagnosis not present

## 2017-06-02 DIAGNOSIS — K219 Gastro-esophageal reflux disease without esophagitis: Secondary | ICD-10-CM | POA: Diagnosis not present

## 2017-06-02 DIAGNOSIS — I10 Essential (primary) hypertension: Secondary | ICD-10-CM | POA: Diagnosis not present

## 2017-06-02 DIAGNOSIS — F329 Major depressive disorder, single episode, unspecified: Secondary | ICD-10-CM | POA: Diagnosis not present

## 2017-06-02 DIAGNOSIS — J449 Chronic obstructive pulmonary disease, unspecified: Secondary | ICD-10-CM | POA: Diagnosis not present

## 2017-06-02 DIAGNOSIS — E039 Hypothyroidism, unspecified: Secondary | ICD-10-CM | POA: Diagnosis not present

## 2017-06-14 ENCOUNTER — Encounter: Payer: Self-pay | Admitting: Internal Medicine

## 2017-06-22 ENCOUNTER — Other Ambulatory Visit: Payer: Self-pay | Admitting: Family Medicine

## 2017-06-22 DIAGNOSIS — Z1231 Encounter for screening mammogram for malignant neoplasm of breast: Secondary | ICD-10-CM

## 2017-07-20 ENCOUNTER — Ambulatory Visit
Admission: RE | Admit: 2017-07-20 | Discharge: 2017-07-20 | Disposition: A | Discharge status: Home / Self Care | Payer: Medicare Other | Source: Ambulatory Visit | Attending: Family Medicine | Admitting: Family Medicine

## 2017-07-20 DIAGNOSIS — Z1231 Encounter for screening mammogram for malignant neoplasm of breast: Secondary | ICD-10-CM

## 2018-02-09 DIAGNOSIS — Z86718 Personal history of other venous thrombosis and embolism: Secondary | ICD-10-CM

## 2018-02-09 HISTORY — DX: Personal history of other venous thrombosis and embolism: Z86.718

## 2018-06-14 ENCOUNTER — Other Ambulatory Visit: Payer: Self-pay | Admitting: Family Medicine

## 2018-06-14 DIAGNOSIS — Z1231 Encounter for screening mammogram for malignant neoplasm of breast: Secondary | ICD-10-CM

## 2018-08-08 ENCOUNTER — Ambulatory Visit
Admission: RE | Admit: 2018-08-08 | Discharge: 2018-08-08 | Disposition: A | Discharge status: Home / Self Care | Payer: Medicare Other | Source: Ambulatory Visit | Attending: Family Medicine | Admitting: Family Medicine

## 2018-08-08 ENCOUNTER — Other Ambulatory Visit: Payer: Self-pay

## 2018-08-08 DIAGNOSIS — Z1231 Encounter for screening mammogram for malignant neoplasm of breast: Secondary | ICD-10-CM

## 2019-02-06 ENCOUNTER — Encounter (HOSPITAL_COMMUNITY): Payer: Self-pay | Admitting: Emergency Medicine

## 2019-02-06 ENCOUNTER — Emergency Department (HOSPITAL_COMMUNITY): Payer: Medicare Other

## 2019-02-06 ENCOUNTER — Other Ambulatory Visit: Payer: Self-pay

## 2019-02-06 ENCOUNTER — Inpatient Hospital Stay (HOSPITAL_COMMUNITY)
Admission: EM | Admit: 2019-02-06 | Discharge: 2019-02-13 | DRG: 167 | Disposition: A | Discharge status: Home Health | Payer: Medicare Other | Attending: Internal Medicine | Admitting: Internal Medicine

## 2019-02-06 DIAGNOSIS — Z7951 Long term (current) use of inhaled steroids: Secondary | ICD-10-CM

## 2019-02-06 DIAGNOSIS — I2692 Saddle embolus of pulmonary artery without acute cor pulmonale: Secondary | ICD-10-CM

## 2019-02-06 DIAGNOSIS — I2609 Other pulmonary embolism with acute cor pulmonale: Secondary | ICD-10-CM | POA: Diagnosis not present

## 2019-02-06 DIAGNOSIS — Z8249 Family history of ischemic heart disease and other diseases of the circulatory system: Secondary | ICD-10-CM

## 2019-02-06 DIAGNOSIS — K219 Gastro-esophageal reflux disease without esophagitis: Secondary | ICD-10-CM | POA: Diagnosis present

## 2019-02-06 DIAGNOSIS — Z825 Family history of asthma and other chronic lower respiratory diseases: Secondary | ICD-10-CM

## 2019-02-06 DIAGNOSIS — Z8709 Personal history of other diseases of the respiratory system: Secondary | ICD-10-CM

## 2019-02-06 DIAGNOSIS — J9811 Atelectasis: Secondary | ICD-10-CM | POA: Diagnosis present

## 2019-02-06 DIAGNOSIS — F17210 Nicotine dependence, cigarettes, uncomplicated: Secondary | ICD-10-CM | POA: Diagnosis present

## 2019-02-06 DIAGNOSIS — K449 Diaphragmatic hernia without obstruction or gangrene: Secondary | ICD-10-CM | POA: Diagnosis present

## 2019-02-06 DIAGNOSIS — F329 Major depressive disorder, single episode, unspecified: Secondary | ICD-10-CM | POA: Diagnosis present

## 2019-02-06 DIAGNOSIS — M8580 Other specified disorders of bone density and structure, unspecified site: Secondary | ICD-10-CM | POA: Diagnosis present

## 2019-02-06 DIAGNOSIS — E785 Hyperlipidemia, unspecified: Secondary | ICD-10-CM | POA: Diagnosis present

## 2019-02-06 DIAGNOSIS — I50811 Acute right heart failure: Secondary | ICD-10-CM | POA: Diagnosis present

## 2019-02-06 DIAGNOSIS — N040 Nephrotic syndrome with minor glomerular abnormality: Secondary | ICD-10-CM | POA: Diagnosis present

## 2019-02-06 DIAGNOSIS — J449 Chronic obstructive pulmonary disease, unspecified: Secondary | ICD-10-CM | POA: Diagnosis present

## 2019-02-06 DIAGNOSIS — Z85828 Personal history of other malignant neoplasm of skin: Secondary | ICD-10-CM

## 2019-02-06 DIAGNOSIS — R911 Solitary pulmonary nodule: Secondary | ICD-10-CM | POA: Diagnosis present

## 2019-02-06 DIAGNOSIS — E039 Hypothyroidism, unspecified: Secondary | ICD-10-CM | POA: Diagnosis present

## 2019-02-06 DIAGNOSIS — Z79890 Hormone replacement therapy (postmenopausal): Secondary | ICD-10-CM

## 2019-02-06 DIAGNOSIS — I10 Essential (primary) hypertension: Secondary | ICD-10-CM | POA: Diagnosis present

## 2019-02-06 DIAGNOSIS — Z20822 Contact with and (suspected) exposure to covid-19: Secondary | ICD-10-CM | POA: Diagnosis present

## 2019-02-06 DIAGNOSIS — Z96653 Presence of artificial knee joint, bilateral: Secondary | ICD-10-CM | POA: Diagnosis present

## 2019-02-06 DIAGNOSIS — J9601 Acute respiratory failure with hypoxia: Secondary | ICD-10-CM | POA: Diagnosis present

## 2019-02-06 DIAGNOSIS — Z823 Family history of stroke: Secondary | ICD-10-CM

## 2019-02-06 DIAGNOSIS — Z79899 Other long term (current) drug therapy: Secondary | ICD-10-CM

## 2019-02-06 DIAGNOSIS — F419 Anxiety disorder, unspecified: Secondary | ICD-10-CM | POA: Diagnosis present

## 2019-02-06 DIAGNOSIS — I2699 Other pulmonary embolism without acute cor pulmonale: Secondary | ICD-10-CM

## 2019-02-06 DIAGNOSIS — N050 Unspecified nephritic syndrome with minor glomerular abnormality: Secondary | ICD-10-CM | POA: Diagnosis present

## 2019-02-06 DIAGNOSIS — E119 Type 2 diabetes mellitus without complications: Secondary | ICD-10-CM | POA: Diagnosis present

## 2019-02-06 DIAGNOSIS — Z90710 Acquired absence of both cervix and uterus: Secondary | ICD-10-CM

## 2019-02-06 DIAGNOSIS — Z7984 Long term (current) use of oral hypoglycemic drugs: Secondary | ICD-10-CM

## 2019-02-06 DIAGNOSIS — Z807 Family history of other malignant neoplasms of lymphoid, hematopoietic and related tissues: Secondary | ICD-10-CM

## 2019-02-06 DIAGNOSIS — I110 Hypertensive heart disease with heart failure: Secondary | ICD-10-CM | POA: Diagnosis present

## 2019-02-06 LAB — CBC WITH DIFFERENTIAL/PLATELET
Abs Immature Granulocytes: 0.04 10*3/uL (ref 0.00–0.07)
Basophils Absolute: 0.1 10*3/uL (ref 0.0–0.1)
Basophils Relative: 1 %
Eosinophils Absolute: 0.1 10*3/uL (ref 0.0–0.5)
Eosinophils Relative: 1 %
HCT: 39.1 % (ref 36.0–46.0)
Hemoglobin: 11.8 g/dL — ABNORMAL LOW (ref 12.0–15.0)
Immature Granulocytes: 0 %
Lymphocytes Relative: 13 %
Lymphs Abs: 1.4 10*3/uL (ref 0.7–4.0)
MCH: 27.1 pg (ref 26.0–34.0)
MCHC: 30.2 g/dL (ref 30.0–36.0)
MCV: 89.9 fL (ref 80.0–100.0)
Monocytes Absolute: 1.3 10*3/uL — ABNORMAL HIGH (ref 0.1–1.0)
Monocytes Relative: 12 %
Neutro Abs: 7.5 10*3/uL (ref 1.7–7.7)
Neutrophils Relative %: 73 %
Platelets: 288 10*3/uL (ref 150–400)
RBC: 4.35 MIL/uL (ref 3.87–5.11)
RDW: 16.5 % — ABNORMAL HIGH (ref 11.5–15.5)
WBC: 10.4 10*3/uL (ref 4.0–10.5)
nRBC: 0 % (ref 0.0–0.2)

## 2019-02-06 NOTE — ED Triage Notes (Signed)
Patient arrived with EMS from home reports SOB for several weeks , denies cough or fever , denies chest pain , history of COPD .

## 2019-02-07 ENCOUNTER — Emergency Department (HOSPITAL_COMMUNITY): Payer: Medicare Other

## 2019-02-07 ENCOUNTER — Encounter (HOSPITAL_COMMUNITY): Payer: Self-pay

## 2019-02-07 DIAGNOSIS — I2609 Other pulmonary embolism with acute cor pulmonale: Secondary | ICD-10-CM | POA: Diagnosis not present

## 2019-02-07 DIAGNOSIS — Z823 Family history of stroke: Secondary | ICD-10-CM | POA: Diagnosis not present

## 2019-02-07 DIAGNOSIS — Z7984 Long term (current) use of oral hypoglycemic drugs: Secondary | ICD-10-CM | POA: Diagnosis not present

## 2019-02-07 DIAGNOSIS — N049 Nephrotic syndrome with unspecified morphologic changes: Secondary | ICD-10-CM | POA: Diagnosis not present

## 2019-02-07 DIAGNOSIS — K219 Gastro-esophageal reflux disease without esophagitis: Secondary | ICD-10-CM | POA: Diagnosis not present

## 2019-02-07 DIAGNOSIS — I2699 Other pulmonary embolism without acute cor pulmonale: Secondary | ICD-10-CM | POA: Diagnosis present

## 2019-02-07 DIAGNOSIS — K449 Diaphragmatic hernia without obstruction or gangrene: Secondary | ICD-10-CM | POA: Diagnosis present

## 2019-02-07 DIAGNOSIS — J9811 Atelectasis: Secondary | ICD-10-CM | POA: Diagnosis not present

## 2019-02-07 DIAGNOSIS — I50811 Acute right heart failure: Secondary | ICD-10-CM | POA: Diagnosis not present

## 2019-02-07 DIAGNOSIS — I361 Nonrheumatic tricuspid (valve) insufficiency: Secondary | ICD-10-CM | POA: Diagnosis not present

## 2019-02-07 DIAGNOSIS — E785 Hyperlipidemia, unspecified: Secondary | ICD-10-CM | POA: Diagnosis not present

## 2019-02-07 DIAGNOSIS — R911 Solitary pulmonary nodule: Secondary | ICD-10-CM | POA: Diagnosis not present

## 2019-02-07 DIAGNOSIS — Z8249 Family history of ischemic heart disease and other diseases of the circulatory system: Secondary | ICD-10-CM | POA: Diagnosis not present

## 2019-02-07 DIAGNOSIS — I110 Hypertensive heart disease with heart failure: Secondary | ICD-10-CM | POA: Diagnosis not present

## 2019-02-07 DIAGNOSIS — R739 Hyperglycemia, unspecified: Secondary | ICD-10-CM | POA: Diagnosis not present

## 2019-02-07 DIAGNOSIS — Z90710 Acquired absence of both cervix and uterus: Secondary | ICD-10-CM | POA: Diagnosis not present

## 2019-02-07 DIAGNOSIS — Z85828 Personal history of other malignant neoplasm of skin: Secondary | ICD-10-CM | POA: Diagnosis not present

## 2019-02-07 DIAGNOSIS — Z825 Family history of asthma and other chronic lower respiratory diseases: Secondary | ICD-10-CM | POA: Diagnosis not present

## 2019-02-07 DIAGNOSIS — I2692 Saddle embolus of pulmonary artery without acute cor pulmonale: Secondary | ICD-10-CM | POA: Diagnosis not present

## 2019-02-07 DIAGNOSIS — F419 Anxiety disorder, unspecified: Secondary | ICD-10-CM | POA: Diagnosis present

## 2019-02-07 DIAGNOSIS — M8580 Other specified disorders of bone density and structure, unspecified site: Secondary | ICD-10-CM | POA: Diagnosis present

## 2019-02-07 DIAGNOSIS — F329 Major depressive disorder, single episode, unspecified: Secondary | ICD-10-CM | POA: Diagnosis present

## 2019-02-07 DIAGNOSIS — J449 Chronic obstructive pulmonary disease, unspecified: Secondary | ICD-10-CM | POA: Diagnosis not present

## 2019-02-07 DIAGNOSIS — Z807 Family history of other malignant neoplasms of lymphoid, hematopoietic and related tissues: Secondary | ICD-10-CM | POA: Diagnosis not present

## 2019-02-07 DIAGNOSIS — E119 Type 2 diabetes mellitus without complications: Secondary | ICD-10-CM | POA: Diagnosis not present

## 2019-02-07 DIAGNOSIS — Z20822 Contact with and (suspected) exposure to covid-19: Secondary | ICD-10-CM | POA: Diagnosis not present

## 2019-02-07 DIAGNOSIS — N040 Nephrotic syndrome with minor glomerular abnormality: Secondary | ICD-10-CM | POA: Diagnosis not present

## 2019-02-07 DIAGNOSIS — F17210 Nicotine dependence, cigarettes, uncomplicated: Secondary | ICD-10-CM | POA: Diagnosis present

## 2019-02-07 DIAGNOSIS — E039 Hypothyroidism, unspecified: Secondary | ICD-10-CM | POA: Diagnosis not present

## 2019-02-07 LAB — BASIC METABOLIC PANEL
Anion gap: 13 (ref 5–15)
BUN: 17 mg/dL (ref 8–23)
CO2: 24 mmol/L (ref 22–32)
Calcium: 9.1 mg/dL (ref 8.9–10.3)
Chloride: 105 mmol/L (ref 98–111)
Creatinine, Ser: 1.22 mg/dL — ABNORMAL HIGH (ref 0.44–1.00)
GFR calc Af Amer: 49 mL/min — ABNORMAL LOW (ref >60)
GFR calc non Af Amer: 42 mL/min — ABNORMAL LOW (ref >60)
Glucose, Bld: 188 mg/dL — ABNORMAL HIGH (ref 70–99)
Potassium: 4.2 mmol/L (ref 3.5–5.1)
Sodium: 142 mmol/L (ref 135–145)

## 2019-02-07 LAB — HEPARIN LEVEL (UNFRACTIONATED): Heparin Unfractionated: 0.82 IU/mL — ABNORMAL HIGH (ref 0.30–0.70)

## 2019-02-07 LAB — D-DIMER, QUANTITATIVE: D-Dimer, Quant: 8.64 ug/mL-FEU — ABNORMAL HIGH (ref 0.00–0.50)

## 2019-02-07 LAB — POC SARS CORONAVIRUS 2 AG -  ED: SARS Coronavirus 2 Ag: NEGATIVE

## 2019-02-07 LAB — TROPONIN I (HIGH SENSITIVITY)
Troponin I (High Sensitivity): 347 ng/L (ref <18)
Troponin I (High Sensitivity): 243 ng/L (ref <18)

## 2019-02-07 LAB — ANTITHROMBIN III: AntiThromb III Func: 103 % (ref 75–120)

## 2019-02-07 LAB — SARS CORONAVIRUS 2 (TAT 6-24 HRS): SARS Coronavirus 2: NEGATIVE

## 2019-02-07 LAB — HEMOGLOBIN A1C
Hgb A1c MFr Bld: 8.4 % — ABNORMAL HIGH (ref 4.8–5.6)
Mean Plasma Glucose: 194.38 mg/dL

## 2019-02-07 LAB — BRAIN NATRIURETIC PEPTIDE: B Natriuretic Peptide: 432.1 pg/mL — ABNORMAL HIGH (ref 0.0–100.0)

## 2019-02-07 MED ORDER — UMECLIDINIUM BROMIDE 62.5 MCG/INH IN AEPB
1.0000 | INHALATION_SPRAY | Freq: Every day | RESPIRATORY_TRACT | Status: DC
  Administered 2019-02-08 – 2019-02-13 (×5): 1 via RESPIRATORY_TRACT
  Filled 2019-02-07 (×2): qty 7

## 2019-02-07 MED ORDER — MONTELUKAST SODIUM 10 MG PO TABS
10.0000 mg | ORAL_TABLET | Freq: Every day | ORAL | Status: DC
  Administered 2019-02-07 – 2019-02-12 (×6): 10 mg via ORAL
  Filled 2019-02-07 (×7): qty 1

## 2019-02-07 MED ORDER — HEPARIN BOLUS VIA INFUSION
5000.0000 [IU] | Freq: Once | INTRAVENOUS | Status: AC
  Administered 2019-02-07: 5000 [IU] via INTRAVENOUS
  Filled 2019-02-07: qty 5000

## 2019-02-07 MED ORDER — LOSARTAN POTASSIUM 50 MG PO TABS
50.0000 mg | ORAL_TABLET | Freq: Every day | ORAL | Status: DC
  Administered 2019-02-08: 50 mg via ORAL
  Filled 2019-02-07: qty 1

## 2019-02-07 MED ORDER — FUROSEMIDE 10 MG/ML IJ SOLN
40.0000 mg | Freq: Once | INTRAMUSCULAR | Status: AC
  Administered 2019-02-07: 40 mg via INTRAVENOUS
  Filled 2019-02-07: qty 4

## 2019-02-07 MED ORDER — ROPINIROLE HCL 1 MG PO TABS
1.0000 mg | ORAL_TABLET | Freq: Every day | ORAL | Status: DC
  Administered 2019-02-07 – 2019-02-12 (×6): 1 mg via ORAL
  Filled 2019-02-07 (×7): qty 1

## 2019-02-07 MED ORDER — IOHEXOL 350 MG/ML SOLN
100.0000 mL | Freq: Once | INTRAVENOUS | Status: AC | PRN
  Administered 2019-02-07: 100 mL via INTRAVENOUS

## 2019-02-07 MED ORDER — TIOTROPIUM BROMIDE MONOHYDRATE 2.5 MCG/ACT IN AERS: 2.0000 | INHALATION_SPRAY | Freq: Every day | RESPIRATORY_TRACT | Status: DC

## 2019-02-07 MED ORDER — DIPHENHYDRAMINE HCL 25 MG PO CAPS
25.0000 mg | ORAL_CAPSULE | Freq: Every evening | ORAL | Status: DC | PRN
  Administered 2019-02-08 – 2019-02-12 (×2): 25 mg via ORAL
  Filled 2019-02-07 (×3): qty 1

## 2019-02-07 MED ORDER — HYDRALAZINE HCL 20 MG/ML IJ SOLN: 5.0000 mg | Freq: Four times a day (QID) | INTRAMUSCULAR | Status: DC | PRN

## 2019-02-07 MED ORDER — HEPARIN (PORCINE) 25000 UT/250ML-% IV SOLN
1300.0000 [IU]/h | INTRAVENOUS | Status: DC
  Administered 2019-02-07: 1400 [IU]/h via INTRAVENOUS
  Administered 2019-02-08: 1300 [IU]/h via INTRAVENOUS
  Filled 2019-02-07 (×2): qty 250

## 2019-02-07 MED ORDER — ROSUVASTATIN CALCIUM 20 MG PO TABS
20.0000 mg | ORAL_TABLET | Freq: Every day | ORAL | Status: DC
  Administered 2019-02-07 – 2019-02-13 (×7): 20 mg via ORAL
  Filled 2019-02-07 (×7): qty 1

## 2019-02-07 MED ORDER — ALBUTEROL SULFATE (2.5 MG/3ML) 0.083% IN NEBU
3.0000 mL | INHALATION_SOLUTION | Freq: Four times a day (QID) | RESPIRATORY_TRACT | Status: DC | PRN
  Administered 2019-02-10: 3 mL via RESPIRATORY_TRACT
  Filled 2019-02-07: qty 3

## 2019-02-07 MED ORDER — ALBUTEROL SULFATE HFA 108 (90 BASE) MCG/ACT IN AERS
4.0000 | INHALATION_SPRAY | RESPIRATORY_TRACT | Status: DC | PRN
  Filled 2019-02-07: qty 6.7

## 2019-02-07 MED ORDER — LOSARTAN POTASSIUM 50 MG PO TABS
50.0000 mg | ORAL_TABLET | Freq: Once | ORAL | Status: AC
  Administered 2019-02-07: 50 mg via ORAL
  Filled 2019-02-07: qty 1

## 2019-02-07 MED ORDER — CITALOPRAM HYDROBROMIDE 20 MG PO TABS
20.0000 mg | ORAL_TABLET | Freq: Every day | ORAL | Status: DC
  Administered 2019-02-07 – 2019-02-12 (×6): 20 mg via ORAL
  Filled 2019-02-07 (×7): qty 1

## 2019-02-07 MED ORDER — PANTOPRAZOLE SODIUM 40 MG PO TBEC
40.0000 mg | DELAYED_RELEASE_TABLET | Freq: Every day | ORAL | Status: DC
  Administered 2019-02-07 – 2019-02-13 (×7): 40 mg via ORAL
  Filled 2019-02-07 (×8): qty 1

## 2019-02-07 MED ORDER — LEVOTHYROXINE SODIUM 88 MCG PO TABS
88.0000 ug | ORAL_TABLET | Freq: Every day | ORAL | Status: DC
  Administered 2019-02-08 – 2019-02-13 (×6): 88 ug via ORAL
  Filled 2019-02-07 (×6): qty 1

## 2019-02-07 MED ORDER — TACROLIMUS 1 MG PO CAPS
1.0000 mg | ORAL_CAPSULE | Freq: Two times a day (BID) | ORAL | Status: DC
  Administered 2019-02-07 – 2019-02-13 (×12): 1 mg via ORAL
  Filled 2019-02-07 (×13): qty 1

## 2019-02-07 MED ORDER — INSULIN ASPART 100 UNIT/ML ~~LOC~~ SOLN: 0.0000 [IU] | Freq: Three times a day (TID) | SUBCUTANEOUS | Status: DC

## 2019-02-07 NOTE — ED Notes (Signed)
Pt placed on purewick, notified Anna(RN)

## 2019-02-07 NOTE — ED Notes (Addendum)
Attempted to ambulate pt, pt sat on the side of the stretcher SpO2 dropped from 98% to 85% while on 2 liters of o2. Notified Anna(RN)

## 2019-02-07 NOTE — Progress Notes (Signed)
[facility] for heparin Indication: pulmonary embolus  Heparin Dosing Weight: 79.1 kg  Labs: Recent Labs    02/06/19 2302 02/07/19 2030  HGB 11.8*  --   HCT 39.1  --   PLT 288  --   HEPARINUNFRC  --  0.82*  CREATININE 1.22*  --     Assessment: 29 yof presenting with increasing SOB, found to have acute bilateral PE with moderate clot burden and RHS. Pharmacy consulted to dose heparin. Patient is not on anticoagulation PTA. Hg 11.8, plt wnl. No active bleed issues documented. Estimated weight 200 lbs in the ED per discussion with RN.  HL 0.82  Goal of Therapy:  Heparin level 0.3-0.7 units/ml Monitor platelets by anticoagulation protocol: Yes   Plan:  Decrease heparin to 1300 units/h 6h heparin level Monitor daily heparin level and CBC, s/sx bleeding   Alanda Slim, PharmD, [facility] Clinical Pharmacist Please see AMION for all Pharmacists' Contact Phone Numbers 02/07/2019, 9:35 PM

## 2019-02-07 NOTE — ED Notes (Signed)
Dr. Nathanial Rancher Notified of initial troponin levels.

## 2019-02-07 NOTE — ED Notes (Signed)
ED TO INPATIENT HANDOFF REPORT  ED Nurse Name and Phone #: William Hamburger, RN [phone]  S Name/Age/Gender [name] 78 y.o. female Room/Bed: 018C/018C  Code Status   Code Status: Full Code  Home/SNF/Other Home Patient oriented to: self, place, time and situation Is this baseline? No   Triage Complete: Triage complete  Chief Complaint Pulmonary embolism [facility]) [I26.99]  Triage Note Patient arrived with EMS from home reports SOB for several weeks , denies cough or fever , denies chest pain , history of COPD .    Allergies No Known Allergies  Level of Care/Admitting Diagnosis ED Disposition    ED Disposition Condition [facility]: [place] [[id number]]  Level of Care: Progressive [102]  Admit to Progressive based on following criteria: CARDIOVASCULAR & THORACIC of moderate stability with acute coronary syndrome symptoms/low risk myocardial infarction/hypertensive urgency/arrhythmias/heart failure potentially compromising stability and stable post cardiovascular intervention patients.  Covid Evaluation: Confirmed COVID Negative  Diagnosis: Pulmonary embolism ([place]) [416606]  Admitting Physician: Sid Falcon [phone]  Attending Physician: Sid Falcon [phone]  Estimated length of stay: past midnight tomorrow  Certification:: I certify this patient will need inpatient services for at least 2 midnights       B Medical/Surgery History Past Medical History:  Diagnosis Date  . Actinic keratosis   . Arthritis   . Asthma   . Basal cell carcinoma   . COPD (chronic obstructive pulmonary disease) ([place])   . GERD (gastroesophageal reflux disease)   . Heart murmur    hx of in childhood   . Hyperlipidemia   . Hypertension   . Hypothyroidism   . MCNS (minimal change nephrotic syndrome)   . Osteopenia   . Shortness of breath dyspnea    on exertion   . Squamous cell carcinoma    Past Surgical History:  Procedure Laterality Date   . ABDOMINAL HYSTERECTOMY    . APPENDECTOMY    . BILATERAL SALPINGOOPHORECTOMY     Ovarian Cysts   . 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 MEDIAL PARTIAL KNEE REPLACEMENT Bilateral   . MOHS SURGERY     x 2  . RENAL BIOPSY     x 2  . ROTATOR CUFF REPAIR Left      A IV Location/Drains/Wounds Patient Lines/Drains/Airways Status   Active Line/Drains/Airways    Name:   Placement date:   Placement time:   Site:   Days:   Peripheral IV 02/07/19 Left Antecubital   02/07/19    0933    Antecubital   less than 1          Intake/Output Last 24 hours No intake or output data in the 24 hours ending 02/07/19 2043  Labs/Imaging Results for orders placed or performed during the hospital encounter of 02/06/19 (from the past 48 hour(s))  CBC with Differential     Status: Abnormal   Collection Time: 02/06/19 11:02 PM  Result Value Ref Range   WBC 10.4 4.0 - 10.5 K/uL   RBC 4.35 3.87 - 5.11 MIL/uL   Hemoglobin 11.8 (L) 12.0 - 15.0 g/dL   HCT 39.1 36.0 - 46.0 %   MCV 89.9 80.0 - 100.0 fL   MCH 27.1 26.0 - 34.0 pg   MCHC 30.2 30.0 - 36.0 g/dL   RDW 16.5 (H)  11.5 - 15.5 %   Platelets 288 150 - 400 K/uL   nRBC 0.0 0.0 - 0.2 %   Neutrophils Relative % 73 %   Neutro Abs 7.5 1.7 - 7.7 K/uL   Lymphocytes Relative 13 %   Lymphs Abs 1.4 0.7 - 4.0 K/uL   Monocytes Relative 12 %   Monocytes Absolute 1.3 (H) 0.1 - 1.0 K/uL   Eosinophils Relative 1 %   Eosinophils Absolute 0.1 0.0 - 0.5 K/uL   Basophils Relative 1 %   Basophils Absolute 0.1 0.0 - 0.1 K/uL   Immature Granulocytes 0 %   Abs Immature Granulocytes 0.04 0.00 - 0.07 K/uL    Comment: Performed at [facility] [address], [place], [place] [address]  Basic metabolic panel     Status: Abnormal   Collection Time: 02/06/19 11:02 PM  Result Value Ref Range   Sodium 142 135  - 145 mmol/L   Potassium 4.2 3.5 - 5.1 mmol/L   Chloride 105 98 - 111 mmol/L   CO2 24 22 - 32 mmol/L   Glucose, Bld 188 (H) 70 - 99 mg/dL   BUN 17 8 - 23 mg/dL   Creatinine, Ser 1.22 (H) 0.44 - 1.00 mg/dL   Calcium 9.1 8.9 - 10.3 mg/dL   GFR calc non Af Amer 42 (L) >60 mL/min   GFR calc Af Amer 49 (L) >60 mL/min   Anion gap 13 5 - 15    Comment: Performed at [facility], [place] [address] [place], [place] [address]  SARS CORONAVIRUS 2 (TAT 6-24 HRS) Nasopharyngeal Nasopharyngeal Swab     Status: None   Collection Time: 02/07/19  7:40 AM   Specimen: Nasopharyngeal Swab  Result Value Ref Range   SARS Coronavirus 2 NEGATIVE NEGATIVE    Comment: (NOTE) SARS-CoV-2 target nucleic acids are NOT DETECTED. The SARS-CoV-2 RNA is generally detectable in upper and lower respiratory specimens during the acute phase of infection. Negative results do not preclude SARS-CoV-2 infection, do not rule out co-infections with other pathogens, and should not be used as the sole basis for treatment or other patient management decisions. Negative results must be combined with clinical observations, patient history, and epidemiological information. The expected result is Negative. Fact Sheet for Patients: SugarRoll.be Fact Sheet for Healthcare Providers: https://www.woods-mathews.com/ This test is not yet approved or cleared by the [place] FDA and  has been authorized for detection and/or diagnosis of SARS-CoV-2 by FDA under an Emergency Use Authorization (EUA). This EUA will remain  in effect (meaning this test can be used) for the duration of the COVID-19 declaration under Section 56 4(b)(1) of the Act, 21 U.S.C. section 360bbb-3(b)(1), unless the authorization is terminated or revoked sooner. Performed at [facility], [place] [address] [place],  [address]   Brain natriuretic peptide     Status: Abnormal   Collection Time: 02/07/19   8:22 AM  Result Value Ref Range   B Natriuretic Peptide 432.1 (H) 0.0 - 100.0 pg/mL    Comment: Performed at [facility] [address] [place],  [address]  D-dimer, quantitative (not at [facility])     Status: Abnormal   Collection Time: 02/07/19  8:22 AM  Result Value Ref Range   D-Dimer, Quant 8.64 (H) 0.00 - 0.50 ug/mL-FEU    Comment: (NOTE) At the manufacturer cut-off of 0.50 ug/mL FEU, this assay has been documented to exclude PE with a sensitivity and negative predictive value of 97  to 99%.  At this time, this assay has not been approved by the FDA to exclude DVT/VTE. Results should be correlated with clinical presentation. Performed at [facility], [place] [address] [place], [place] [address]   POC SARS Coronavirus 2 Ag-ED - Nasal Swab (BD Veritor Kit)     Status: None   Collection Time: 02/07/19 10:23 AM  Result Value Ref Range   SARS Coronavirus 2 Ag NEGATIVE NEGATIVE    Comment: (NOTE) SARS-CoV-2 antigen NOT DETECTED.  Negative results are presumptive.  Negative results do not preclude SARS-CoV-2 infection and should not be used as the sole basis for treatment or other patient management decisions, including infection  control decisions, particularly in the presence of clinical signs and  symptoms consistent with COVID-19, or in those who have been in contact with the virus.  Negative results must be combined with clinical observations, patient history, and epidemiological information. The expected result is Negative. Fact Sheet for Patients: PodPark.tn Fact Sheet for Healthcare Providers: GiftContent.is This test is not yet approved or cleared by the [place] FDA and  has been authorized for detection and/or diagnosis of SARS-CoV-2 by FDA under an Emergency Use Authorization (EUA).  This EUA will remain in effect (meaning this test can be used) for the duration of  the COVID-19 de claration  under Section 564(b)(1) of the Act, 21 U.S.C. section 360bbb-3(b)(1), unless the authorization is terminated or revoked sooner.   Troponin I (High Sensitivity)     Status: Abnormal   Collection Time: 02/07/19  3:03 PM  Result Value Ref Range   Troponin I (High Sensitivity) 347 (HH) <18 ng/L    Comment: CRITICAL RESULT CALLED TO, READ BACK BY AND VERIFIED WITH: L.Dominyck Reser,RN 1600 02/07/2019 CLARK,S (NOTE) Elevated high sensitivity troponin I (hsTnI) values and significant  changes across serial measurements may suggest ACS but many other  chronic and acute conditions are known to elevate hsTnI results.  Refer to the Links section for chest pain algorithms and additional  guidance. Performed at [facility], [place] [address] [place], [place] [address]   Troponin I (High Sensitivity)     Status: Abnormal   Collection Time: 02/07/19  5:00 PM  Result Value Ref Range   Troponin I (High Sensitivity) 243 (HH) <18 ng/L    Comment: CRITICAL VALUE NOTED.  VALUE IS CONSISTENT WITH PREVIOUSLY REPORTED AND CALLED VALUE. (NOTE) Elevated high sensitivity troponin I (hsTnI) values and significant  changes across serial measurements may suggest ACS but many other  chronic and acute conditions are known to elevate hsTnI results.  Refer to the Links section for chest pain algorithms and additional  guidance. Performed at [facility], [place] [address] [place], [place] [address]   Antithrombin III     Status: None   Collection Time: 02/07/19  5:00 PM  Result Value Ref Range   AntiThromb III Func 103 75 - 120 %    Comment: Performed at [facility] [address] [place], [place] [address]   DG Chest 2 View  Result Date: 02/06/2019 CLINICAL DATA:  Shortness of breath EXAM: CHEST - 2 VIEW COMPARISON:  10/14/2007 FINDINGS: Hyperinflation. Mild bronchitic changes. No consolidation or effusion. Borderline cardiomegaly. No pneumothorax. IMPRESSION: Hyperinflated lungs with mild  bronchitic changes. Negative for pulmonary edema or focal airspace disease. Electronically Signed   By: Donavan Foil M.D.   On: 02/06/2019 23:20   CT Angio Chest PE W and/or Wo Contrast  Addendum Date:  02/07/2019   ADDENDUM REPORT: 02/07/2019 14:05 ADDENDUM: These results were called by telephone at the time of interpretation on 02/07/2019 at 2:04 pm to provider [facility] , who verbally acknowledged these results. Electronically Signed   By: Zetta Bills M.D.   On: 02/07/2019 14:05   Result Date: 02/07/2019 CLINICAL DATA:  PE suspected, shortness of breath. EXAM: CT ANGIOGRAPHY CHEST WITH CONTRAST TECHNIQUE: Multidetector CT imaging of the chest was performed using the standard protocol during bolus administration of intravenous contrast. Multiplanar CT image reconstructions and MIPs were obtained to evaluate the vascular anatomy. CONTRAST:  140m OMNIPAQUE IOHEXOL 350 MG/ML SOLN COMPARISON:  Previous chest x-ray February 06, 2019 FINDINGS: Cardiovascular: Heart size is enlarged with signs of right heart strain in the setting of bilateral main pulmonary artery and lobar level pulmonary emboli. Clot burden slightly greater on the right than the left. RV to LV ratio of 1.65. Scattered atherosclerotic calcifications in the thoracic aorta. In addition to main pulmonary artery clot there is upper and lower lobe emboli on the right. The lower lobe and segmental upper lobe/lingular branches on the left. Mediastinum/Nodes: No enlarged mediastinal, hilar, or axillary lymph nodes. Thyroid gland, trachea, and esophagus demonstrate no significant findings. Lungs/Pleura: Small nodule in the right upper lobe 8 mm. (Image 19) No signs of dense consolidation or evidence of pleural effusion. Upper Abdomen: Some reflux of contrast into the hepatic veins may be due to right heart dysfunction in the setting of large volume pulmonary emboli. Moderate hiatal hernia. Musculoskeletal: Lucency in the T4 vertebral body best seen  on sagittal images measuring 14 mm. Multilevel degenerative changes. Review of the MIP images confirms the above findings. IMPRESSION: 1. Study is positive for bilateral pulmonary emboli, with signs of right heart strain in the setting of large volume pulmonary emboli. Large volume of pulmonary emboli in the lungs bilaterally, with dilatation of the right atrium and right ventricle (RV to LV ratio of 1.65) indicative of elevated right-sided heart pressures and right heart strain. These findings have been shown to be associated with a increased morbidity and mortality in the setting pulmonary embolism. 2. 8 mm right upper lobe pulmonary nodule. Non-contrast chest CT at 6-12 months is recommended. If the nodule is stable at time of repeat CT, then future CT at 18-24 months (from today's scan) is considered optional for low-risk patients, but is recommended for high-risk patients. This recommendation follows the consensus statement: Guidelines for Management of Incidental Pulmonary Nodules Detected on CT Images: From the Fleischner Society 2017; Radiology 2017; 284:228-243. 3. 14 mm lucent lesion in the T4 vertebral body, indeterminate., may represent an atypical vertebral hemangioma. Comparison with more remote priors could be helpful. Alternatively follow-up spinal MRI could be useful. 4. Moderate hiatal hernia. 5. Aortic atherosclerosis. 6. Critical Value/emergent results were called by telephone at the time of interpretation on 02/07/2019 at 1:23 pm to providerBOWIE TRAN , who verbally acknowledged these results. Aortic Atherosclerosis (ICD10-I70.0). Electronically Signed: By: GZetta BillsM.D. On: 02/07/2019 13:38    Pending Labs Unresulted Labs (From admission, onward)    Start     Ordered   02/08/19 0500  Heparin level (unfractionated)  Daily,   R     02/07/19 1344   02/08/19 0500  CBC  Daily,   R     02/07/19 1344   02/08/19 04782 Basic metabolic panel  Tomorrow morning,   R     02/07/19 1729    02/07/19 2030  Heparin level (unfractionated)  Once-Timed,  STAT     02/07/19 1344   02/07/19 1732  Tacrolimus level  Add-on,   AD     02/07/19 1731   02/07/19 1732  Hemoglobin A1c  Once,   STAT    Comments: To assess prior glycemic control    02/07/19 1732          Vitals/Pain Today's Vitals   02/07/19 1945 02/07/19 2000 02/07/19 2015 02/07/19 2030  BP: (!) 158/83 133/73 (!) 157/81 (!) 145/104  Pulse: (!) 116 90 100 (!) 116  Resp: 20 (!) 26 (!) 29 (!) 28  Temp:      TempSrc:      SpO2: 100%  97% 95%  Weight:      Height:      PainSc:        Isolation Precautions No active isolations  Medications Medications  heparin ADULT infusion 100 units/mL (25000 units/225m sodium chloride 0.45%) (1,400 Units/hr Intravenous New Bag/Given 02/07/19 1412)  losartan (COZAAR) tablet 50 mg (50 mg Oral Not Given 02/07/19 1929)  rosuvastatin (CRESTOR) tablet 20 mg (20 mg Oral Given 02/07/19 1933)  citalopram (CELEXA) tablet 20 mg (has no administration in time range)  levothyroxine (SYNTHROID) tablet 88 mcg (has no administration in time range)  pantoprazole (PROTONIX) EC tablet 40 mg (40 mg Oral Given 02/07/19 1933)  tacrolimus (PROGRAF) capsule 1 mg (has no administration in time range)  rOPINIRole (REQUIP) tablet 1 mg (has no administration in time range)  albuterol (VENTOLIN HFA) 108 (90 Base) MCG/ACT inhaler 2 puff (has no administration in time range)  diphenhydrAMINE (BENADRYL) tablet 25 mg (has no administration in time range)  montelukast (SINGULAIR) tablet 10 mg (has no administration in time range)  Tiotropium Bromide Monohydrate AERS 2 puff (has no administration in time range)  hydrALAZINE (APRESOLINE) injection 5 mg (has no administration in time range)  insulin aspart (novoLOG) injection 0-9 Units (has no administration in time range)  losartan (COZAAR) tablet 50 mg (50 mg Oral Given 02/07/19 1526)  furosemide (LASIX) injection 40 mg (40 mg Intravenous Given 02/07/19 1413)   iohexol (OMNIPAQUE) 350 MG/ML injection 100 mL (100 mLs Intravenous Contrast Given 02/07/19 1315)  heparin bolus via infusion 5,000 Units (5,000 Units Intravenous Bolus from Bag 02/07/19 1413)    Mobility walks with device Low fall risk   Focused Assessments Cardiac Assessment Handoff:    No results found for: CKTOTAL, CKMB, CKMBINDEX, TROPONINI Lab Results  Component Value Date   DDIMER 8.64 (H) 02/07/2019   Does the Patient currently have chest pain? No  , Pulmonary Assessment Handoff:  Lung sounds:   O2 Device: Nasal Cannula O2 Flow Rate (L/min): 2 L/min      R Recommendations: See Admitting Provider Note  Report given to:   Additional Notes:

## 2019-02-07 NOTE — ED Notes (Signed)
Dr. Trilby Drummer paged to [phone] William Hamburger, RN paged by Levada Dy

## 2019-02-07 NOTE — ED Notes (Signed)
Dinner Tray Ordered @ [phone].

## 2019-02-07 NOTE — Progress Notes (Signed)
[facility] for heparin Indication: pulmonary embolus  Heparin Dosing Weight: 79.1 kg  Labs: Recent Labs    02/06/19 2302  HGB 11.8*  HCT 39.1  PLT 288  CREATININE 1.22*    Assessment: 62 yof presenting with increasing SOB, found to have acute bilateral PE with moderate clot burden and RHS. Pharmacy consulted to dose heparin. Patient is not on anticoagulation PTA. Hg 11.8, plt wnl. No active bleed issues documented. Estimated weight 200 lbs in the ED per discussion with RN.  Goal of Therapy:  Heparin level 0.3-0.7 units/ml Monitor platelets by anticoagulation protocol: Yes   Plan:  Heparin 5500 unit bolus Start heparin at 1400 units/h 6h heparin level Monitor daily heparin level and CBC, s/sx bleeding   Elicia Lamp, PharmD, BCPS Clinical Pharmacist 02/07/2019 1:31 PM

## 2019-02-07 NOTE — ED Provider Notes (Signed)
[facility] EMERGENCY DEPARTMENT Provider Note   CSN: [id number] Arrival date & time: 02/06/19  2229     History Chief Complaint  Patient presents with  . Shortness of Breath    COPD    [name] is a 78 y.o. female.  The history is provided by the patient. No language interpreter was used.  Shortness of Breath      78 year old female with history of COPD, asthma, hypertension, GERD, brought here via EMS from home for evaluation of shortness of breath.  Patient report progressive worsening shortness of breath ongoing for the past 3 to 4 months.  She report symptoms started shortly after she was giving a medication "tacrolimus" for her kidney disease.  She mention shortness of breath worsening with exertion and improved at rest.  Increase shortness of breath to the point of making it difficult for her to walk from her bedroom to her bathroom without getting out of breath.  She also noticed some increased fluid gain approximately 15 pounds within the past 3 months.  Patient however denies having any fever, chills, URI symptoms, nausea, vomiting, wheezing, loss of taste or smell, having chest pain, productive cough, abdominal pain or back pain.  Patient denies history of tobacco use.  She has been compliant with her medication.  Patient lives at home.  Patient reports she broke her L ankle approximately 2 months ago and currently wearing a cam walker.  No surgery.  Denies any increasing ankle pain.  She reports shortness of breath started before her ankle injury.  Past Medical History:  Diagnosis Date  . Actinic keratosis   . Arthritis   . Asthma   . Basal cell carcinoma   . COPD (chronic obstructive pulmonary disease) ([place])   . GERD (gastroesophageal reflux disease)   . Heart murmur    hx of in childhood   . Hyperlipidemia   . Hypertension   . Hypothyroidism   . MCNS (minimal change nephrotic syndrome)   . Osteopenia   . Shortness of breath dyspnea     on exertion   . Squamous cell carcinoma     Patient Active Problem List   Diagnosis Date Noted  . GERD without esophagitis 05/15/2017  . Dyslipidemia 05/15/2017  . Anxiety disorder 05/15/2017  . Constipation due to opioid therapy 05/15/2017  . Accidental fall from chair, initial encounter 05/08/2017  . Closed posttraumatic compression fracture of lumbar vertebra ([place]) 05/08/2017  . Leukocytosis 05/08/2017  . 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 S/P knee replacement 07/10/2014  . COPD (chronic obstructive pulmonary disease) ([place]) 09/24/2013  . Hypothyroidism 07/24/2012  . Essential hypertension, benign 07/24/2012  . Allergic rhinitis 07/24/2012    Past Surgical History:  Procedure Laterality Date  . ABDOMINAL HYSTERECTOMY    . APPENDECTOMY    . BILATERAL SALPINGOOPHORECTOMY     Ovarian Cysts   . 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 MEDIAL PARTIAL KNEE REPLACEMENT Bilateral   . MOHS SURGERY     x 2  . RENAL BIOPSY     x 2  . ROTATOR CUFF REPAIR Left      OB History   No obstetric history on file.     Family History  Problem Relation Age of Onset  . Lymphoma Father   . Heart disease Paternal Grandfather   .  COPD Mother        Husband Smoked   . Stroke Maternal Grandfather   . Rheumatic fever Paternal Grandmother   . Hypertension Sister   . Scoliosis Daughter     Social History   Tobacco Use  . Smoking status: Former Smoker    Packs/day: 1.50    Years: 20.00    Pack years: 30.00    Types: Cigarettes    Quit date: 01/12/1973    Years since quitting: 46.1  . Smokeless tobacco: Never Used  Substance Use Topics  . Alcohol use: Yes    Alcohol/week: 0.5 standard drinks    Types: 1 Standard drinks or equivalent per week    Comment: She occasionally drinks a glass of wine.    . Drug use: No    Home  Medications Prior to Admission medications   Medication Sig Start Date End Date Taking? Authorizing Provider  acetaminophen (TYLENOL) 500 MG tablet Take 2 tablets (1,000 mg total) by mouth every 6 (six) hours as needed for mild pain. 05/12/17   Doreatha Lew, MD  Calcium Carb-Cholecalciferol (CALCIUM-VITAMIN D) 600-400 MG-UNIT TABS Take 1 tablet by mouth daily.     [provider]  cholecalciferol (VITAMIN D) 1000 units tablet Take 1,000 Units by mouth 2 (two) times daily.    [provider]  citalopram (CELEXA) 20 MG tablet TAKE 1 TABLET BY MOUTH EVERY DAY AT NIGHT 05/01/17   [provider]  diphenhydrAMINE (BENADRYL) 25 MG tablet Take 25 mg by mouth daily.    [provider]  levothyroxine (SYNTHROID, LEVOTHROID) 88 MCG tablet Take 88 mcg by mouth every morning. 04/17/17   [provider]  losartan (COZAAR) 50 MG tablet Take 50 mg by mouth daily. 04/08/17   [provider]  Magnesium Hydroxide (MILK OF MAGNESIA PO) Give 30 cc by mouth x 1 dose in 24 hours as needed if no BM in 3 days 05/12/17   [provider]  montelukast (SINGULAIR) 10 MG tablet TAKE 1 TABLET BY MOUTH EVERY DAY AT NIGHT 04/16/17   [provider]  Multiple Vitamin (MULTIVITAMIN WITH MINERALS) TABS tablet Take 1 tablet by mouth 2 (two) times daily.    [provider]  omeprazole (PRILOSEC) 20 MG capsule TAKE 1 CAPSULE (20 MG TOTAL) BY MOUTH TWO (2) TIMES A DAY. 04/07/17   [provider]  oxyCODONE (OXY IR/ROXICODONE) 5 MG immediate release tablet Take 5 mg by mouth every 6 (six) hours as needed for severe pain. 7 day supply to go home with    [provider]  polyethylene glycol (MIRALAX / GLYCOLAX) packet Take 17 g by mouth daily.    [provider]  rosuvastatin (CRESTOR) 40 MG tablet Take 20 mg by mouth daily. MWF    [provider]  Tiotropium Bromide Monohydrate (SPIRIVA RESPIMAT) 2.5 MCG/ACT AERS Inhale two  puffs by mouth once a day for COPD Rinse mouth after using the inhaler    [provider]    Allergies    Patient has no known allergies.  Review of Systems   Review of Systems  Respiratory: Positive for shortness of breath.   All other systems reviewed and are negative.   Physical Exam Updated Vital Signs BP (!) 149/94 (BP Location: Left Arm)   Pulse 94   Temp 98.2 F (36.8 C) (Oral)   Resp 18   SpO2 94%   Physical Exam Vitals and nursing note reviewed.  Constitutional:  General: She is not in acute distress.    Appearance: She is well-developed. She is obese.  HENT:     Head: Atraumatic.  Eyes:     Conjunctiva/sclera: Conjunctivae normal.  Cardiovascular:     Rate and Rhythm: Normal rate and regular rhythm.  Pulmonary:     Effort: Pulmonary effort is normal.     Breath sounds: Normal breath sounds. No decreased breath sounds, wheezing, rhonchi or rales.  Chest:     Chest wall: No tenderness.  Musculoskeletal:     Cervical back: Neck supple.  Skin:    Findings: No rash.  Neurological:     Mental Status: She is alert.     ED Results / Procedures / Treatments   Labs (all labs ordered are listed, but only abnormal results are displayed) Labs Reviewed  CBC WITH DIFFERENTIAL/PLATELET - Abnormal; Notable for the following components:      Result Value   Hemoglobin 11.8 (*)    RDW 16.5 (*)    Monocytes Absolute 1.3 (*)    All other components within normal limits  BASIC METABOLIC PANEL - Abnormal; Notable for the following components:   Glucose, Bld 188 (*)    Creatinine, Ser 1.22 (*)    GFR calc non Af Amer 42 (*)    GFR calc Af Amer 49 (*)    All other components within normal limits  BRAIN NATRIURETIC PEPTIDE - Abnormal; Notable for the following components:   B Natriuretic Peptide 432.1 (*)    All other components within normal limits  D-DIMER, QUANTITATIVE (NOT AT [facility]) - Abnormal; Notable for the following components:   D-Dimer, Quant  8.64 (*)    All other components within normal limits  SARS CORONAVIRUS 2 (TAT 6-24 HRS)  HEPARIN LEVEL (UNFRACTIONATED)  POC SARS CORONAVIRUS 2 AG -  ED  TROPONIN I (HIGH SENSITIVITY)    EKG EKG Interpretation  Date/Time:  Monday February 06 2019 22:35:12 EST Ventricular Rate:  107 PR Interval:  140 QRS Duration: 78 QT Interval:  326 QTC Calculation: 435 R Axis:   -46 Text Interpretation: Sinus tachycardia with Premature atrial complexes with Abberant conduction Left axis deviation Pulmonary disease pattern Septal infarct , age undetermined Abnormal ECG No significant change since last tracing aside from faster rate Confirmed by Merrily Pew [phone]) on 02/06/2019 11:25:26 PM   Radiology DG Chest 2 View  Result Date: 02/06/2019 CLINICAL DATA:  Shortness of breath EXAM: CHEST - 2 VIEW COMPARISON:  10/14/2007 FINDINGS: Hyperinflation. Mild bronchitic changes. No consolidation or effusion. Borderline cardiomegaly. No pneumothorax. IMPRESSION: Hyperinflated lungs with mild bronchitic changes. Negative for pulmonary edema or focal airspace disease. Electronically Signed   By: Donavan Foil M.D.   On: 02/06/2019 23:20   CT Angio Chest PE W and/or Wo Contrast  Addendum Date: 02/07/2019   ADDENDUM REPORT: 02/07/2019 14:05 ADDENDUM: These results were called by telephone at the time of interpretation on 02/07/2019 at 2:04 pm to provider [facility] , who verbally acknowledged these results. Electronically Signed   By: Zetta Bills M.D.   On: 02/07/2019 14:05   Result Date: 02/07/2019 CLINICAL DATA:  PE suspected, shortness of breath. EXAM: CT ANGIOGRAPHY CHEST WITH CONTRAST TECHNIQUE: Multidetector CT imaging of the chest was performed using the standard protocol during bolus administration of intravenous contrast. Multiplanar CT image reconstructions and MIPs were obtained to evaluate the vascular anatomy. CONTRAST:  173mL OMNIPAQUE IOHEXOL 350 MG/ML SOLN COMPARISON:  Previous chest x-ray  February 06, 2019 FINDINGS: Cardiovascular: Heart size  is enlarged with signs of right heart strain in the setting of bilateral main pulmonary artery and lobar level pulmonary emboli. Clot burden slightly greater on the right than the left. RV to LV ratio of 1.65. Scattered atherosclerotic calcifications in the thoracic aorta. In addition to main pulmonary artery clot there is upper and lower lobe emboli on the right. The lower lobe and segmental upper lobe/lingular branches on the left. Mediastinum/Nodes: No enlarged mediastinal, hilar, or axillary lymph nodes. Thyroid gland, trachea, and esophagus demonstrate no significant findings. Lungs/Pleura: Small nodule in the right upper lobe 8 mm. (Image 19) No signs of dense consolidation or evidence of pleural effusion. Upper Abdomen: Some reflux of contrast into the hepatic veins may be due to right heart dysfunction in the setting of large volume pulmonary emboli. Moderate hiatal hernia. Musculoskeletal: Lucency in the T4 vertebral body best seen on sagittal images measuring 14 mm. Multilevel degenerative changes. Review of the MIP images confirms the above findings. IMPRESSION: 1. Study is positive for bilateral pulmonary emboli, with signs of right heart strain in the setting of large volume pulmonary emboli. Large volume of pulmonary emboli in the lungs bilaterally, with dilatation of the right atrium and right ventricle (RV to LV ratio of 1.65) indicative of elevated right-sided heart pressures and right heart strain. These findings have been shown to be associated with a increased morbidity and mortality in the setting pulmonary embolism. 2. 8 mm right upper lobe pulmonary nodule. Non-contrast chest CT at 6-12 months is recommended. If the nodule is stable at time of repeat CT, then future CT at 18-24 months (from today's scan) is considered optional for low-risk patients, but is recommended for high-risk patients. This recommendation follows the consensus  statement: Guidelines for Management of Incidental Pulmonary Nodules Detected on CT Images: From the Fleischner Society 2017; Radiology 2017; 284:228-243. 3. 14 mm lucent lesion in the T4 vertebral body, indeterminate., may represent an atypical vertebral hemangioma. Comparison with more remote priors could be helpful. Alternatively follow-up spinal MRI could be useful. 4. Moderate hiatal hernia. 5. Aortic atherosclerosis. 6. Critical Value/emergent results were called by telephone at the time of interpretation on 02/07/2019 at 1:23 pm to providerBOWIE Kamerin Grumbine , who verbally acknowledged these results. Aortic Atherosclerosis (ICD10-I70.0). Electronically Signed: By: Zetta Bills M.D. On: 02/07/2019 13:38    Procedures Procedures (including critical care time)  Medications Ordered in ED Medications  albuterol (VENTOLIN HFA) 108 (90 Base) MCG/ACT inhaler 4 puff (has no administration in time range)  losartan (COZAAR) tablet 50 mg (has no administration in time range)  heparin ADULT infusion 100 units/mL (25000 units/233mL sodium chloride 0.45%) (1,400 Units/hr Intravenous New Bag/Given 02/07/19 1412)  furosemide (LASIX) injection 40 mg (40 mg Intravenous Given 02/07/19 1413)  iohexol (OMNIPAQUE) 350 MG/ML injection 100 mL (100 mLs Intravenous Contrast Given 02/07/19 1315)  heparin bolus via infusion 5,000 Units (5,000 Units Intravenous Bolus from Bag 02/07/19 1413)    ED Course  I have reviewed the triage vital signs and the nursing notes.  Pertinent labs & imaging results that were available during my care of the patient were reviewed by me and considered in my medical decision making (see chart for details).    MDM Rules/Calculators/A&P                      BP (!) 154/86   Pulse 87   Temp 98.6 F (37 C) (Oral)   Resp (!) 24   Ht 5\' 6"  (1.676 m)  Wt 90.7 kg   SpO2 (!) 89%   BMI 32.28 kg/m   Final Clinical Impression(s) / ED Diagnoses Final diagnoses:  Other acute pulmonary  embolism with acute cor pulmonale ([place])    Rx / DC Orders ED Discharge Orders    None     Patient with history of COPD here with progressive worsening shortness of breath for several months.  On exam, very minimal wheezes.  Patient able to speak in complete sentences, in no acute respiratory discomfort.  O2 sats at 94% on room air.  She did report fluid gain therefore I will check BNP to assess for potentialCHF however patient does not appear to be fluid overloaded.  She did injured her left ankle for several months prior, will obtain D-dimer to rule out PE.  Work-up initiated.  Care discussed with Dr. Darl Householder.  12:55 PM Initial COVID-19 test is negative.  Labs remarkable for an elevated BNP of 432.1 as well as elevated BMI of 8.64.  Patient is currently awaits chest CT angiogram to rule out PE.  Patient given 4 mg of Lasix via IV.  Patient noted to have elevated blood pressure of 208/181, she was also given her home blood pressure medication.  Her chest x-ray shows hyperinflated lungs with mild bronchitic change but no evidence of pulmonary edema or focal airspace disease.  1:26 PM Chest CT angiogram demonstrated moderate clot burden to both lungs with evidence of right heart strain.  Patient meets criteria for code PE.  Will consult intensivist for management.  Heparin per pharmacy to dose.  1:33 PM Appreciate consultation from intensivist Dr. Ruthann Cancer who request medicine for admission, to trend troponin, obtain echo and continue with heparin.    Pt is aware of plan and agrees with plan.  I have also call and notify family members as per her request.   2:02 PM Incidentally, radiologist also noted pulmonary nodule as well as a lesion in her thoracic spine that was found on the CT scan.  Recommendation to have follow-up imaging for further assessment.  I did discuss this with patient's daughter over the phone and encourage appropriate follow-up.  She voiced understanding and agrees with  plan.  2:53 PM Appreciate consultation from internal medicine resident who agrees to see and admit patient for further management.  At this time patient is stable.  Patient is currently on 2 L of oxygen, on room air she sats at 89%'s.  [name] was evaluated in Emergency Department on 02/07/2019 for the symptoms described in the history of present illness. She was evaluated in the context of the global COVID-19 pandemic, which necessitated consideration that the patient might be at risk for infection with the SARS-CoV-2 virus that causes COVID-19. Institutional protocols and algorithms that pertain to the evaluation of patients at risk for COVID-19 are in a state of rapid change based on information released by regulatory bodies including the CDC and federal and state organizations. These policies and algorithms were followed during the patient's care in the ED.    Domenic Moras, PA-C 02/07/19 1456    Mesner, Corene Cornea, MD 02/07/19 2257

## 2019-02-07 NOTE — H&P (Addendum)
Date: 02/07/2019               Patient Name:  [name] MRN: [id number]  DOB: November 26, 1940 Age / Sex: 78 y.o., female   PCP: Jonathon Resides, MD              Medical Service: Internal Medicine Teaching Service              Attending Physician: Dr. Sid Falcon, MD    First Contact: Dr. Sheppard Coil Pager: [phone]  Second Contact: Dr. Eileen Stanford Pager: [phone]       After Hours (After 5p/  First Contact Pager: [phone]  weekends / holidays): Second Contact Pager: [phone]   Chief Complaint: Shortness of breath  History of Present Illness: [name] is a 78 y.o. female with past medical history of COPD, asthma, hypertension, and recently diagnosed minimal change disease who presents with chief complaint of shortness of breath. 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 She is followed by [facility] who started her on prednisone and tacrolimus this past summer. She states that since that time she has experienced an insidious in onset and slowly progressive shortness of breath. She states this shortness of breath is unlike her typical COPD or asthma. She states her shortness of breath acutely worsened 1 month ago (11/21) after she broke her ankle and was placed in a walking boot (did not have surgical correction). Since then she has remained relatively sedentary and her shortness of breath has continued to worsen. She denies any associated chest pain, fever, chills, cough, or hemoptysis. She further denies any recognizable leg swelling or calf pain. She denies any recent travel, malignancy, recent surgery, or history of DVT/PE. She denies any family history of DVT/PE.   In the ED, D-dimer was elevated at 8.64 with a BNP of 432.1 and elevated high sensitivity troponin of 347. She subsequently received a CTA chest which revealed bilateral  pulmonary emboli with dilation of the right atrium and right ventricle indicative of right heart strain. She was subsequently given heparin bolus followed by a drip at 1400 units/hr.  Meds: Current Meds  Medication Sig  . albuterol (VENTOLIN HFA) 108 (90 Base) MCG/ACT inhaler Inhale 2 puffs into the lungs every 6 (six) hours as needed for wheezing.  . Calcium Carb-Cholecalciferol (CALCIUM-VITAMIN D) 600-400 MG-UNIT TABS Take 1 tablet by mouth daily.   . cholecalciferol (VITAMIN D) 1000 units tablet Take 1,000 Units by mouth 2 (two) times daily.  . citalopram (CELEXA) 20 MG tablet Take 20 mg by mouth at bedtime.   . diphenhydrAMINE (BENADRYL) 25 MG tablet Take 25 mg by mouth at bedtime as needed for sleep.   Marland Kitchen levothyroxine (SYNTHROID, LEVOTHROID) 88 MCG tablet Take 88 mcg by mouth every morning.  Marland Kitchen losartan (COZAAR) 100 MG tablet Take 100 mg by mouth daily.  . montelukast (SINGULAIR) 10 MG tablet Take 10 mg by mouth at bedtime.   . Multiple Vitamin (MULTIVITAMIN WITH MINERALS) TABS tablet Take 1 tablet by mouth 2 (two) times daily.  Marland Kitchen omeprazole (PRILOSEC) 40 MG capsule Take 40 mg by mouth 2 (two) times daily.  Marland Kitchen rOPINIRole (REQUIP) 1 MG tablet Take 1 mg by mouth at bedtime.  . rosuvastatin (CRESTOR) 40 MG tablet Take 20 mg by mouth daily. MWF  . tacrolimus (PROGRAF) 1 MG capsule Take 1 mg by mouth 2 (two)  times daily.   . Tiotropium Bromide Monohydrate (SPIRIVA RESPIMAT) 2.5 MCG/ACT AERS Inhale 2 puffs into the lungs daily. Rinse mouth after using the inhaler  . WIXELA INHUB 100-50 MCG/DOSE AEPB Inhale 1 puff into the lungs 2 (two) times daily.   Allergies: Allergies as of 02/06/2019  . (No Known Allergies)   Past Medical History:  Diagnosis Date  . Actinic keratosis   . Arthritis   . Asthma   . Basal cell carcinoma   . COPD (chronic obstructive pulmonary disease) ([place])   . GERD (gastroesophageal reflux disease)   . Heart murmur    hx of in childhood   . Hyperlipidemia   .  Hypertension   . Hypothyroidism   . MCNS (minimal change nephrotic syndrome)   . Osteopenia   . Shortness of breath dyspnea    on exertion   . Squamous cell carcinoma    Family History:  Mother (Decsd) - COPD Father (Decsd) - Lymphoma Sister 1 Associate Professor) - Hypertension Sister 2 (Alive) - N/a MGM (Decsd) - N/a MGF (Decsd) - Stroke PGM (Decsd) - Rheumatic fever PGF (Decsd) - Heart disease  Social History:  EtOH - Yes (0.5 drinks/week) Substance use - No Tobacco - Former (30 pack years; quit date 01/12/1973)  Occupation - Pharmacist, hospital Marital status - Widowed  Review of Systems: A complete ROS was negative except as per HPI.  Physical Exam: Blood pressure (!) 169/77, pulse 92, temperature 98.6 F (37 C), temperature source Oral, resp. rate 16, height 5\' 6"  (1.676 m), weight 90.7 kg, SpO2 92 %.  Physical Exam Vitals reviewed.  Constitutional:      General: She is not in acute distress.    Appearance: She is well-developed. She is obese. She is ill-appearing. She is not diaphoretic.  HENT:     Head: Normocephalic and atraumatic.  Eyes:     Extraocular Movements: Extraocular movements intact.     Pupils: Pupils are equal, round, and reactive to light.  Cardiovascular:     Rate and Rhythm: Normal rate and regular rhythm.     Pulses: Normal pulses.     Heart sounds: Normal heart sounds. No murmur. No friction rub. No gallop.      Comments: JVD + to the mid neck Pulmonary:     Effort: Pulmonary effort is normal. Tachypnea present.     Breath sounds: Normal breath sounds. No decreased breath sounds, wheezing or rhonchi.  Abdominal:     General: Bowel sounds are normal.     Palpations: Abdomen is soft.     Tenderness: There is no abdominal tenderness. There is no guarding or rebound.  Musculoskeletal:     Right lower leg: No edema.     Left lower leg: No edema.     Comments: L ankle in boot  Skin:    General: Skin is warm.  Neurological:     General: No focal deficit  present.     Mental Status: She is alert and oriented to person, place, and time.     Cranial Nerves: No cranial nerve deficit.     Motor: No weakness.  Psychiatric:        Mood and Affect: Mood normal.    EKG: Sinus tachycardia at 107 bpm with premature atrial complexes and left axis deviation. No ST segment elevations or depressions appreciated.   CXR: Hyperinflation. Mild bronchitic changes. No consolidation or effusion. Borderline cardiomegaly. No pneumothorax.   CTA Chest:  1. Study is positive for bilateral pulmonary emboli, with  signs of right heart strain in the setting of large volume pulmonary emboli. Large volume of pulmonary emboli in the lungs bilaterally, with dilatation of the right atrium and right ventricle (RV to LV ratio of 1.65) indicative of elevated right-sided heart pressures and right heart strain. These findings have been shown to be associated with a increased morbidity and mortality in the setting pulmonary embolism. 2. 8 mm right upper lobe pulmonary nodule. Non-contrast chest CT at 6-12 months is recommended. If the nodule is stable at time of repeat CT, then future CT at 18-24 months (from today's scan) is considered optional for low-risk patients, but is recommended for high-risk patients. This recommendation follows the consensus statement: Guidelines for Management of Incidental Pulmonary Nodules Detected on CT Images: From the Fleischner Society 2017; Radiology 2017; 284:228-243. 3. 14 mm lucent lesion in the T4 vertebral body, indeterminate., may represent an atypical vertebral hemangioma. Comparison with more remote priors could be helpful. Alternatively follow-up spinal MRI could be useful. 4. Moderate hiatal hernia. 5. Aortic atherosclerosis.  Assessment & Plan by Problem:  In summary, [name] is a 78 y.o female with PMHx significant for COPD, asthma, hypertension, and recently diagnosed minimal change disease who presented with acute  on chronic shortness of breath likley secondary to bilateral pulmonary emboli discovered on CTA chest.   #Bilateral Pulmonary Embolism #Shortness of breath: Patient complains of progressively worsening shortness of breath x6 months after diagnosis of minimal change disease in July. She states this shortness of breath intensified over the past month shortly after she broke her ankle at the end of November. She denies any chest pain, cough, hemoptysis, or fever. Patient with Well's score of 4.5 on presentation complemented with elevated D-dimer of 8.64. Subsequent CTA chest in the ED revealed bilateral pulmonary emboli with evidence of right heart strain. Patient is currently hemodynamically stable and satting at 95% on 2L [place]. Currently on heparin. Given patient's known nephrotic syndrome, will obtain antithrombin III levels to determine efficacy or continuing heparin. - Continue heparin drip - F/u on antithrombin III level - TTE ordered - Telemetry  #Hx of COPD #Hx of Asthma -Albuterol 2 puffs q6hrs PRN -Singulair 10 mg daily -Tiotropium bromide monohydrate 2 puffs daily -Benadryl 25 mg nightly   #Pulmonary nodule: 8 mm RUL pulmonary nodule as well as a lesion in her thoracic spine that was found on the CT scan.   -Repeat CT chest in 6-12 months.  #Hypertension: Patient found to be hypertensive at 169/77 mmHg on exam. S/p IV Lasix 40 mg x1 in ED. - Restart on home losartan 50 mg qd  #Minimal change disease: Patient with recently diagnosed minimal change disease 6 months ago.She is followed by [facility] and is currently taking Tacrolimus. Loss of antithrombin III due to this known nephrotic syndrome and potentially the underlying cause of aforementioned coagulopathy.  tacrolimus 1 mg BID -F/u on antithrombin III level as above -Tacrolimus level ordered   #FEN/GI -Diet: 2g sodium diet -Fludis: none  #DVT prophylaxis -Heparin drip  #CODE STATUS: FULL  #Dispo: Admit patient  to Inpatient with expected length of stay greater than 2 midnights.  Signed: Benedetto Goad, Medical Student 02/07/2019, 4:01 PM   Attestation for Student Documentation:  I personally was present and performed or re-performed the history, physical exam and medical decision-making activities of this service and have verified that the service and findings are accurately documented in the student's note.  Earlene Plater, MD Internal Medicine, PGY1 Pager: [phone]  02/07/2019,5:15  PM   

## 2019-02-08 ENCOUNTER — Inpatient Hospital Stay (HOSPITAL_COMMUNITY): Payer: Medicare Other

## 2019-02-08 DIAGNOSIS — I2781 Cor pulmonale (chronic): Secondary | ICD-10-CM

## 2019-02-08 DIAGNOSIS — I2609 Other pulmonary embolism with acute cor pulmonale: Principal | ICD-10-CM

## 2019-02-08 DIAGNOSIS — I361 Nonrheumatic tricuspid (valve) insufficiency: Secondary | ICD-10-CM

## 2019-02-08 DIAGNOSIS — I2692 Saddle embolus of pulmonary artery without acute cor pulmonale: Secondary | ICD-10-CM

## 2019-02-08 HISTORY — PX: IR INFUSION THROMBOL ARTERIAL INITIAL (MS): IMG5376

## 2019-02-08 HISTORY — PX: IR ANGIOGRAM SELECTIVE EACH ADDITIONAL VESSEL: IMG667

## 2019-02-08 HISTORY — PX: IR US GUIDE VASC ACCESS RIGHT: IMG2390

## 2019-02-08 HISTORY — PX: IR ANGIOGRAM PULMONARY BILATERAL SELECTIVE: IMG664

## 2019-02-08 LAB — CBC
HCT: 37.1 % (ref 36.0–46.0)
HCT: 36.7 % (ref 36.0–46.0)
Hemoglobin: 11.5 g/dL — ABNORMAL LOW (ref 12.0–15.0)
Hemoglobin: 11.3 g/dL — ABNORMAL LOW (ref 12.0–15.0)
MCH: 27.1 pg (ref 26.0–34.0)
MCH: 27.4 pg (ref 26.0–34.0)
MCHC: 31 g/dL (ref 30.0–36.0)
MCHC: 30.8 g/dL (ref 30.0–36.0)
MCV: 87.5 fL (ref 80.0–100.0)
MCV: 88.9 fL (ref 80.0–100.0)
Platelets: 255 10*3/uL (ref 150–400)
Platelets: 233 10*3/uL (ref 150–400)
RBC: 4.24 MIL/uL (ref 3.87–5.11)
RBC: 4.13 MIL/uL (ref 3.87–5.11)
RDW: 16.7 % — ABNORMAL HIGH (ref 11.5–15.5)
RDW: 16.6 % — ABNORMAL HIGH (ref 11.5–15.5)
WBC: 12 10*3/uL — ABNORMAL HIGH (ref 4.0–10.5)
WBC: 11.3 10*3/uL — ABNORMAL HIGH (ref 4.0–10.5)
nRBC: 0 % (ref 0.0–0.2)
nRBC: 0 % (ref 0.0–0.2)

## 2019-02-08 LAB — BASIC METABOLIC PANEL
Anion gap: 11 (ref 5–15)
BUN: 18 mg/dL (ref 8–23)
CO2: 26 mmol/L (ref 22–32)
Calcium: 8.7 mg/dL — ABNORMAL LOW (ref 8.9–10.3)
Chloride: 103 mmol/L (ref 98–111)
Creatinine, Ser: 1.19 mg/dL — ABNORMAL HIGH (ref 0.44–1.00)
GFR calc Af Amer: 50 mL/min — ABNORMAL LOW (ref >60)
GFR calc non Af Amer: 43 mL/min — ABNORMAL LOW (ref >60)
Glucose, Bld: 177 mg/dL — ABNORMAL HIGH (ref 70–99)
Potassium: 4.1 mmol/L (ref 3.5–5.1)
Sodium: 140 mmol/L (ref 135–145)

## 2019-02-08 LAB — GLUCOSE, CAPILLARY
Glucose-Capillary: 140 mg/dL — ABNORMAL HIGH (ref 70–99)
Glucose-Capillary: 108 mg/dL — ABNORMAL HIGH (ref 70–99)
Glucose-Capillary: 139 mg/dL — ABNORMAL HIGH (ref 70–99)

## 2019-02-08 LAB — HEPARIN LEVEL (UNFRACTIONATED)
Heparin Unfractionated: 0.57 IU/mL (ref 0.30–0.70)
Heparin Unfractionated: 0.47 IU/mL (ref 0.30–0.70)

## 2019-02-08 LAB — ECHOCARDIOGRAM COMPLETE
Height: 66 in
Weight: 3200 oz

## 2019-02-08 LAB — FIBRINOGEN: Fibrinogen: 665 mg/dL — ABNORMAL HIGH (ref 210–475)

## 2019-02-08 LAB — MRSA PCR SCREENING: MRSA by PCR: NEGATIVE

## 2019-02-08 MED ORDER — HEPARIN (PORCINE) 25000 UT/250ML-% IV SOLN
1300.0000 [IU]/h | INTRAVENOUS | Status: DC
  Administered 2019-02-08 – 2019-02-09 (×2): 1300 [IU]/h via INTRAVENOUS
  Filled 2019-02-08 (×2): qty 250

## 2019-02-08 MED ORDER — CHLORHEXIDINE GLUCONATE CLOTH 2 % EX PADS
6.0000 | MEDICATED_PAD | Freq: Every day | CUTANEOUS | Status: DC
  Administered 2019-02-08 – 2019-02-13 (×3): 6 via TOPICAL

## 2019-02-08 MED ORDER — "THROMBI-PAD 3""X3"" EX PADS"
1.0000 | MEDICATED_PAD | Freq: Once | CUTANEOUS | Status: AC
  Administered 2019-02-08: 1 via TOPICAL
  Filled 2019-02-08: qty 1

## 2019-02-08 MED ORDER — ENSURE MAX PROTEIN PO LIQD
11.0000 [oz_av] | Freq: Two times a day (BID) | ORAL | Status: DC
  Administered 2019-02-09 – 2019-02-13 (×5): 11 [oz_av] via ORAL
  Filled 2019-02-08 (×13): qty 330

## 2019-02-08 MED ORDER — ACETAMINOPHEN 325 MG PO TABS
650.0000 mg | ORAL_TABLET | Freq: Four times a day (QID) | ORAL | Status: DC | PRN
  Administered 2019-02-08 – 2019-02-11 (×7): 650 mg via ORAL
  Filled 2019-02-08 (×7): qty 2

## 2019-02-08 MED ORDER — LOSARTAN POTASSIUM 50 MG PO TABS
75.0000 mg | ORAL_TABLET | Freq: Every day | ORAL | Status: DC
  Administered 2019-02-09 – 2019-02-13 (×5): 75 mg via ORAL
  Filled 2019-02-08 (×5): qty 1

## 2019-02-08 MED ORDER — CHLORHEXIDINE GLUCONATE CLOTH 2 % EX PADS: 6.0000 | MEDICATED_PAD | Freq: Every day | CUTANEOUS | Status: DC

## 2019-02-08 MED ORDER — METFORMIN HCL 500 MG PO TABS
500.0000 mg | ORAL_TABLET | Freq: Every day | ORAL | Status: DC
  Administered 2019-02-10 – 2019-02-13 (×4): 500 mg via ORAL
  Filled 2019-02-08 (×5): qty 1

## 2019-02-08 MED ORDER — APIXABAN 5 MG PO TABS: 5.0000 mg | ORAL_TABLET | Freq: Two times a day (BID) | ORAL | Status: DC

## 2019-02-08 MED ORDER — INSULIN ASPART 100 UNIT/ML ~~LOC~~ SOLN
0.0000 [IU] | Freq: Three times a day (TID) | SUBCUTANEOUS | Status: DC
  Administered 2019-02-09 (×2): 3 [IU] via SUBCUTANEOUS
  Administered 2019-02-10 – 2019-02-12 (×2): 2 [IU] via SUBCUTANEOUS

## 2019-02-08 MED ORDER — FENTANYL CITRATE (PF) 100 MCG/2ML IJ SOLN
INTRAMUSCULAR | Status: AC | PRN
  Administered 2019-02-08: 50 ug via INTRAVENOUS

## 2019-02-08 MED ORDER — SODIUM CHLORIDE 0.9 % IV SOLN
12.0000 mg | Freq: Once | INTRAVENOUS | Status: AC
  Administered 2019-02-08: 12 mg via INTRAVENOUS
  Filled 2019-02-08: qty 12

## 2019-02-08 MED ORDER — SODIUM CHLORIDE 0.9 % IV SOLN: INTRAVENOUS | Status: DC

## 2019-02-08 MED ORDER — SODIUM CHLORIDE 0.9 % IV SOLN: 250.0000 mL | INTRAVENOUS | Status: DC | PRN

## 2019-02-08 MED ORDER — LIDOCAINE HCL 1 % IJ SOLN
INTRAMUSCULAR | Status: AC
  Filled 2019-02-08: qty 20

## 2019-02-08 MED ORDER — ORAL CARE MOUTH RINSE
15.0000 mL | Freq: Two times a day (BID) | OROMUCOSAL | Status: DC
  Administered 2019-02-08 – 2019-02-13 (×7): 15 mL via OROMUCOSAL

## 2019-02-08 MED ORDER — LIDOCAINE HCL (PF) 1 % IJ SOLN
INTRAMUSCULAR | Status: AC | PRN
  Administered 2019-02-08: 10 mL

## 2019-02-08 MED ORDER — FENTANYL CITRATE (PF) 100 MCG/2ML IJ SOLN
INTRAMUSCULAR | Status: AC
  Filled 2019-02-08: qty 2

## 2019-02-08 MED ORDER — SODIUM CHLORIDE 0.9% FLUSH: 3.0000 mL | INTRAVENOUS | Status: DC | PRN

## 2019-02-08 MED ORDER — IOHEXOL 300 MG/ML  SOLN
100.0000 mL | Freq: Once | INTRAMUSCULAR | Status: AC | PRN
  Administered 2019-02-08: 36 mL via INTRAVENOUS

## 2019-02-08 MED ORDER — SODIUM CHLORIDE 0.9% FLUSH
3.0000 mL | Freq: Two times a day (BID) | INTRAVENOUS | Status: DC
  Administered 2019-02-08 – 2019-02-13 (×9): 3 mL via INTRAVENOUS

## 2019-02-08 MED ORDER — HYDROCHLOROTHIAZIDE 12.5 MG PO CAPS
12.5000 mg | ORAL_CAPSULE | Freq: Every day | ORAL | Status: DC
  Administered 2019-02-08 – 2019-02-13 (×6): 12.5 mg via ORAL
  Filled 2019-02-08 (×6): qty 1

## 2019-02-08 MED ORDER — APIXABAN 5 MG PO TABS: 10.0000 mg | ORAL_TABLET | Freq: Two times a day (BID) | ORAL | Status: DC

## 2019-02-08 NOTE — Progress Notes (Addendum)
Subjective:  Patient evaluated at bedside this morning. Patient overall feels well and denies any current complaints including shortness of breath, cough, fever, chest pain, leg swelling, or bruising.  Early on 2 L of omental oxygen.She further denies any questions at this time.  Objective:  Vital signs in last 24 hours: Vitals:   02/07/19 2134 02/07/19 2320 02/08/19 0740 02/08/19 0752  BP: (!) 144/96 (!) 159/97  (!) 150/77  Pulse: 96 92 89 89  Resp: (!) 25 (!) 24 16   Temp: 98.6 F (37 C)   98.6 F (37 C)  TempSrc: Oral   Oral  SpO2: 98% 93% 93% 94%  Weight:      Height:  5\' 6"  (1.676 m)     Physical Exam  Constitutional: She is oriented to person, place, and time and well-developed, well-nourished, and in no distress. No distress.  HENT:  Head: Normocephalic and atraumatic.  Nasal cannula in place  Eyes: Pupils are equal, round, and reactive to light. Conjunctivae and EOM are normal.  Cardiovascular: Normal rate, regular rhythm and normal heart sounds.  Pulmonary/Chest: Effort normal and breath sounds normal. No respiratory distress. She has no wheezes.  Musculoskeletal:        General: Normal range of motion.     Cervical back: Neck supple.  Neurological: She is alert and oriented to person, place, and time.  Skin: Skin is warm and dry.  Psychiatric: Affect normal.  Nursing note and vitals reviewed.  CT Angio Chest PE W and/or Wo Contrast  Addendum Date: 02/07/2019   ADDENDUM REPORT: 02/07/2019 14:05 ADDENDUM: These results were called by telephone at the time of interpretation on 02/07/2019 at 2:04 pm to provider [facility] , who verbally acknowledged these results. Electronically Signed   By: Zetta Bills M.D.   On: 02/07/2019 14:05   Result Date: 02/07/2019 CLINICAL DATA:  PE suspected, shortness of breath. EXAM: CT ANGIOGRAPHY CHEST WITH CONTRAST TECHNIQUE: Multidetector CT imaging of the chest was performed using the standard protocol during bolus  administration of intravenous contrast. Multiplanar CT image reconstructions and MIPs were obtained to evaluate the vascular anatomy. CONTRAST:  148mL OMNIPAQUE IOHEXOL 350 MG/ML SOLN COMPARISON:  Previous chest x-ray February 06, 2019 FINDINGS: Cardiovascular: Heart size is enlarged with signs of right heart strain in the setting of bilateral main pulmonary artery and lobar level pulmonary emboli. Clot burden slightly greater on the right than the left. RV to LV ratio of 1.65. Scattered atherosclerotic calcifications in the thoracic aorta. In addition to main pulmonary artery clot there is upper and lower lobe emboli on the right. The lower lobe and segmental upper lobe/lingular branches on the left. Mediastinum/Nodes: No enlarged mediastinal, hilar, or axillary lymph nodes. Thyroid gland, trachea, and esophagus demonstrate no significant findings. Lungs/Pleura: Small nodule in the right upper lobe 8 mm. (Image 19) No signs of dense consolidation or evidence of pleural effusion. Upper Abdomen: Some reflux of contrast into the hepatic veins may be due to right heart dysfunction in the setting of large volume pulmonary emboli. Moderate hiatal hernia. Musculoskeletal: Lucency in the T4 vertebral body best seen on sagittal images measuring 14 mm. Multilevel degenerative changes. Review of the MIP images confirms the above findings. IMPRESSION: 1. Study is positive for bilateral pulmonary emboli, with signs of right heart strain in the setting of large volume pulmonary emboli. Large volume of pulmonary emboli in the lungs bilaterally, with dilatation of the right atrium and right ventricle (RV to LV ratio of 1.65) indicative  of elevated right-sided heart pressures and right heart strain. These findings have been shown to be associated with a increased morbidity and mortality in the setting pulmonary embolism. 2. 8 mm right upper lobe pulmonary nodule. Non-contrast chest CT at 6-12 months is recommended. If the nodule is  stable at time of repeat CT, then future CT at 18-24 months (from today's scan) is considered optional for low-risk patients, but is recommended for high-risk patients. This recommendation follows the consensus statement: Guidelines for Management of Incidental Pulmonary Nodules Detected on CT Images: From the Fleischner Society 2017; Radiology 2017; 284:228-243. 3. 14 mm lucent lesion in the T4 vertebral body, indeterminate., may represent an atypical vertebral hemangioma. Comparison with more remote priors could be helpful. Alternatively follow-up spinal MRI could be useful. 4. Moderate hiatal hernia. 5. Aortic atherosclerosis. 6. Critical Value/emergent results were called by telephone at the time of interpretation on 02/07/2019 at 1:23 pm to providerBOWIE TRAN , who verbally acknowledged these results. Aortic Atherosclerosis (ICD10-I70.0). Electronically Signed: By: Zetta Bills M.D. On: 02/07/2019 13:38   ECHOCARDIOGRAM COMPLETE  Result Date: 02/08/2019   ECHOCARDIOGRAM REPORT   Patient Name:   [name] Date of Exam: 02/08/2019 Medical Rec #:  [id number]              Height:       66.0 in Accession #:    [id number]             Weight:       200.0 lb Date of Birth:  1940/10/17              BSA:          2.00 m Patient Age:    78 years               BP:           159/97 mmHg Patient Gender: F                      HR:           91 bpm. Exam Location:  Inpatient Procedure: 2D Echo, Cardiac Doppler and Color Doppler STAT ECHO Indications:    Dyspnea 786.09/R06.00  History:        Patient has no prior history of Echocardiogram examinations.                 COPD, Signs/Symptoms:Murmur; Risk Factors:Hypertension and                 Dyslipidemia. GERD. Cancer. Hypothyroidism.  Sonographer:    Tiffany Dance Referring Phys: [id number] Stem  1. RV is severely dilated with moderately reduced function. Findings consistent with acute RV failure in setting of submassive PE.  2. Global right  ventricle has moderately reduced systolic function.The right ventricular size is severely enlarged. No increase in right ventricular wall thickness.  3. Right ventricular pressure overload.  4. 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  5. Left ventricular ejection fraction, by visual estimation, is 60 to 65%. The left ventricle has normal function. There is borderline left ventricular hypertrophy.  6. Left ventricular diastolic parameters are consistent with Grade I diastolic dysfunction (impaired relaxation).  7. The left ventricle demonstrates regional wall motion abnormalities.  8. Left atrial size was normal.  9. Right atrial size was mild-moderately dilated. 10. Presence of pericardial fat pad. 11. Trivial pericardial effusion is present. 12. The mitral valve is grossly normal. No evidence of  mitral valve regurgitation. 13. The tricuspid valve is grossly normal. 14. The aortic valve was not well visualized. Aortic valve regurgitation is not visualized. No evidence of aortic valve sclerosis or stenosis. 15. The pulmonic valve was grossly normal. Pulmonic valve regurgitation is not visualized. 16. Moderately elevated pulmonary artery systolic pressure. 17. The tricuspid regurgitant velocity is 3.63 m/s, and with an assumed right atrial pressure of 8 mmHg, the estimated right ventricular systolic pressure is moderately elevated at 60.8 mmHg. 18. The inferior vena cava is dilated in size with >50% respiratory variability, suggesting right atrial pressure of 8 mmHg. 19. Changes from prior study are noted. FINDINGS  Left Ventricle: Left ventricular ejection fraction, by visual estimation, is 60 to 65%. The left ventricle has normal function. The left ventricle demonstrates regional wall motion abnormalities. The left ventricular internal cavity size was the left ventricle is normal in size. There is borderline left ventricular hypertrophy. The interventricular septum is flattened in systole,  consistent with right ventricular pressure overload. Left ventricular diastolic parameters are consistent with Grade I diastolic dysfunction (impaired relaxation). Right Ventricle: The right ventricular size is severely enlarged. No increase in right ventricular wall thickness. Global RV systolic function is has moderately reduced systolic function. The tricuspid regurgitant velocity is 3.63 m/s, and with an assumed right atrial pressure of 8 mmHg, the estimated right ventricular systolic pressure is moderately elevated at 60.8 mmHg. 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 RV is severely dilated with moderately reduced function. Findings consistent with acute RV failure in setting of submassive PE. Left Atrium: Left atrial size was normal in size. Right Atrium: Right atrial size was mild-moderately dilated Pericardium: Trivial pericardial effusion is present. Presence of pericardial fat pad. Mitral Valve: The mitral valve is grossly normal. No evidence of mitral valve regurgitation. Tricuspid Valve: The tricuspid valve is grossly normal. Tricuspid valve regurgitation is mild. Aortic Valve: The aortic valve was not well visualized. Aortic valve regurgitation is not visualized. The aortic valve is structurally normal, with no evidence of sclerosis or stenosis. Mild aortic valve annular calcification. Pulmonic Valve: The pulmonic valve was grossly normal. Pulmonic valve regurgitation is not visualized. Pulmonic regurgitation is not visualized. Aorta: The aortic root and ascending aorta are structurally normal, with no evidence of dilitation. Venous: The inferior vena cava is dilated in size with greater than 50% respiratory variability, suggesting right atrial pressure of 8 mmHg. IAS/Shunts: No atrial level shunt detected by color flow Doppler.  LEFT VENTRICLE PLAX 2D LVIDd:         3.70 cm LVIDs:         2.60 cm LV PW:         1.20 cm LV IVS:        1.10 cm LVOT diam:     1.70 cm LV SV:         34  ml LV SV Index:   16.06 LVOT Area:     2.27 cm  LEFT ATRIUM         Index LA diam:    4.10 cm 2.05 cm/m  TRICUSPID VALVE TR Peak grad:   52.8 mmHg TR Vmax:        363.17 cm/s  SHUNTS Systemic Diam: 1.70 cm  Eleonore Chiquito MD Electronically signed by Eleonore Chiquito MD Signature Date/Time: 02/08/2019/10:10:13 AM    Final    Assessment & Plan by Problem:  In summary, [name] is a 78 y.o female with PMHx significant for COPD, asthma, hypertension, and recently diagnosed  minimal change disease who is HD#2 with acute on chronic shortness of breath secondary to bilateral pulmonary emboli discovered on CTA chest.  #Bilateral Pulmonary Embolism: Patient complains of progressively worsening shortness of breath x6 months after diagnosis of minimal change disease in July. She states this shortness of breath intensified over the past month shortly after she broke her ankle at the end of November. She denies any chest pain, cough, hemoptysis, or fever. Patient with Well's score of 4.5 on presentation complemented with elevated D-dimer of 8.64. Subsequent CTA chest in the ED revealed bilateral pulmonary emboli with evidence of right heart strain. Patient is currently hemodynamically stable and satting at 94% on 2L [place]. Currently on heparin; will start on DOAC.  - Heparin drip per pharmacy, will likely transition to [place] when appropriate. - EKOS today (see below) -Ambulate patient with without O2 - Telemetry   #Acute right sided heart failure: TTE today revealed severely dilated right ventricle with moderately reduced function consistent with the setting of submassive PE. Patient hemodynamically stable at this time. Cardiology recommended possible catheter directed lytics based on imaging. Critical care consulted. Will proceed with EKOS given patient has no contraindications to thrombolytics. Patient is awre and agreeable with this plan.  -PCCM recommendations appreciated   -Proceed with IR consult for EKOS. Pt  will be transferred to 23M for the procedure.   -Recommends Eliquis indefinitely upon discharge.  -We will arrange home follow-up for echo cardiogram, PFT/COPD management, and pulmonary nodule (see below)  #Pulmonary nodule: 8 mm RUL pulmonary nodule as well as a lesion in her thoracic spine that was found on the CT scan.  -Repeat CT chest in 6-12 months -F/u w/ pulmonology   #New onset T2DM: Hgb A1c was 8.4 on admission.  Patient does not have a history of diabetes but does note that when she started taking steroids for minimal-change disease, he had to be careful of her blood sugars. -Start Metformin 500 mg daily today -SSI   #Hx of COPD #Hx of Asthma -Albuterol 2 puffs q6hrs PRN -Singulair 10 mg daily -Tiotropium bromide monohydrate 2 puffs daily -Benadryl 25 mg nightly   #Hypertension: Patient found to be hypertensive at 169/77 mmHg on exam. S/p IV Lasix 40 mg x1 in ED. -Losartan 75 mg daily -HCTZ 12.5 mg daily -IV hydralazine 5 mg every 6 hours as needed  #HLD -Rosuvastatin 20 mg daily  #Hypothyroidism -Synthroid 88 mcg daily  #FEN/GI -Diet: NPO -Fluids: none   #DVT prophylaxis - Heparin drip   #CODE STATUS: FULL  #Dispo: Pending medical course   LOS: 1 day   Benedetto Goad, Medical Student 02/08/2019, 10:58 AM  Attestation for Student Documentation:  I personally was present and performed or re-performed the history, physical exam and medical decision-making activities of this service and have verified that the service and findings are accurately documented in the student's note.  Earlene Plater, MD Internal Medicine, PGY1 Pager: [phone]  02/08/2019,12:43 PM

## 2019-02-08 NOTE — Progress Notes (Signed)
Bedside report given to Shirlean Mylar, RN. TPA infusing through sheath. Orders and pump rates reviewed with RN.

## 2019-02-08 NOTE — Procedures (Signed)
Acute submassive PE with cor pulmonale / right heart strain  S/p bilateral PA infusion cath insertions for PE lysis  No comp Stable Central Left PA pressure 57/34(43)  Full report in pacs

## 2019-02-08 NOTE — Progress Notes (Signed)
  Date: 02/08/2019  Patient name: [name]  Medical record number: [id number]  Date of birth: [birth year]   I have seen and evaluated [name] and discussed their care with the Residency Team. Briefly, [name] is a 78 year old woman with provoked submassive PE with right heart strain who was admitted for work up of SOB.  She will go to the ICU for catheter directed thrombolysis today.     Vitals:   02/08/19 1434 02/08/19 1520  BP: 131/85 129/76  Pulse: 84 87  Resp: 20 (!) 22  Temp:    SpO2: 98% 100%   Sitting up in bed, NAD Eyes: Anicteric sclerae HENT: Supple, MMM  CV: RR, NR, no murmur, + pedal edema on the left Pulm: Breathing comfortably, CTAB, no wheezing Abd: Soft, +BS MSK: She has swelling and pain of the left ankle from recent fractures Skin: No rash, bruising noted Psych: Normal mood and insight  Assessment and Plan: I have seen and evaluated the patient as outlined above. I agree with the formulated Assessment and Plan as detailed in the residents' note, with the following changes:   1. Submassive PE - Catheter directed thrombolysis today - Monitor in the ICU - Eliquis afterwards, opt for lifelong per PCCM  2. 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 Redgie Grayer note.   Danielle Falcon, MD 12/30/20205:35 PM

## 2019-02-08 NOTE — Progress Notes (Signed)
Pt transferred to 2M14. VS stable, belongings at the bedside

## 2019-02-08 NOTE — Progress Notes (Signed)
[place] for heparin Indication: pulmonary embolus  Heparin Dosing Weight: 79.1 kg  Labs: Recent Labs    02/06/19 2302 02/07/19 2030 02/08/19 0452 02/08/19 1649 02/08/19 1830  HGB 11.8*  --  11.5* 11.3*  --   HCT 39.1  --  37.1 36.7  --   PLT 288  --  255 233  --   HEPARINUNFRC  --  0.82* 0.57  --  0.47  CREATININE 1.22*  --  1.19*  --   --     Assessment: 48 yof presenting with increasing SOB, found to have acute bilateral PE with moderate clot burden and RHS  Now s/p catheter directed thrombolysis this afternoon.  Heparin level this evening is within therapeutic range at 0.47. No bleeding issues noted.   Goal of Therapy:  Heparin level 0.3-0.7 units/ml Monitor platelets by anticoagulation protocol: Yes   Plan:  Continue heparin 1300 units/hr Will follow q6h lvls while on lysis  Erin Hearing PharmD., BCPS Clinical Pharmacist 02/08/2019 8:09 PM

## 2019-02-08 NOTE — Consult Note (Addendum)
Chief Complaint: Patient was seen in consultation today for pulmonary embolus  Referring Physician(s): Dr. Erskine Emery  Supervising Physician: Daryll Brod  Patient Status: [facility] - In-pt  History of Present Illness: [name] is a 78 y.o. female with history of asthma, GERD, HLD, HTN, COPD, and minimal change disease who presented to [facility] ED with shortness of breath.  Patient states she noticed increased shortness of breath in the Fall of this year which has been stable until recent events.  She fell and broke her ankle ~1 month ago and since then has been immobilized and sedentary.  She reports her shortness of breath continued to worsen over the past 1 month until minimal exertion caused increased work of breathing.  She presented to the ED for evaluation and was found to have a large PE with heart stain.   CT Chest 12/29: 1. Study is positive for bilateral pulmonary emboli, with signs of right heart strain in the setting of large volume pulmonary emboli. Large volume of pulmonary emboli in the lungs bilaterally, with dilatation of the right atrium and right ventricle (RV to LV ratio of 1.65) indicative of elevated right-sided heart pressures and right heart strain. These findings have been shown to be associated with a increased morbidity and mortality in the setting pulmonary Embolism.  2. 8 mm right upper lobe pulmonary nodule. Non-contrast chest CT at 6-12 months is recommended. If the nodule is stable at time of repeat CT, then future CT at 18-24 months (from today's scan) is considered optional for low-risk patients, but is recommended for high-risk patients. This recommendation follows the consensus statement: Guidelines for Management of Incidental Pulmonary Nodules Detected on CT Images: From the Fleischner Society 2017; Radiology 2017; 284:228-243. 3. 14 mm lucent lesion in the T4 vertebral body, indeterminate., may represent an atypical vertebral hemangioma.  Comparison with more remote priors could be helpful. Alternatively follow-up spinal MRI could be useful. 4. Moderate hiatal hernia. 5. Aortic atherosclerosis.  PCCM has evaluated patient recommends EKOS lysis.  IR consulted and case reviewed by Dr. Annamaria Boots who approves patient for procedure today.   Patient is assessed at bedside. She converses easily at baseline. O2 stats 93-94% on 4L [place].  HR 84. She reports her breathing is comfortable at rest, but minimal movement/exertion increases her shortness of breath.  She is agreeable to PE lysis as indicated.    Past Medical History:  Diagnosis Date  . Actinic keratosis   . Arthritis   . Asthma   . Basal cell carcinoma   . COPD (chronic obstructive pulmonary disease) ([place])   . GERD (gastroesophageal reflux disease)   . Heart murmur    hx of in childhood   . Hyperlipidemia   . Hypertension   . Hypothyroidism   . MCNS (minimal change nephrotic syndrome)   . Osteopenia   . Shortness of breath dyspnea    on exertion   . Squamous cell carcinoma     Past Surgical History:  Procedure Laterality Date  . ABDOMINAL HYSTERECTOMY    . APPENDECTOMY    . BILATERAL SALPINGOOPHORECTOMY     Ovarian Cysts   . 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 MEDIAL PARTIAL KNEE REPLACEMENT Bilateral   . MOHS SURGERY     x 2  . RENAL BIOPSY     x 2  .  ROTATOR CUFF REPAIR Left     Allergies: Patient has no known allergies.  Medications: Prior to Admission medications   Medication Sig Start Date End Date Taking? Authorizing Provider  albuterol (VENTOLIN HFA) 108 (90 Base) MCG/ACT inhaler Inhale 2 puffs into the lungs every 6 (six) hours as needed for wheezing. 12/12/18  Yes [provider]  Calcium Carb-Cholecalciferol (CALCIUM-VITAMIN D) 600-400 MG-UNIT TABS Take 1 tablet by mouth daily.     Yes [provider]  cholecalciferol (VITAMIN D) 1000 units tablet Take 1,000 Units by mouth 2 (two) times daily.   Yes [provider]  citalopram (CELEXA) 20 MG tablet Take 20 mg by mouth at bedtime.  05/01/17  Yes [provider]  diphenhydrAMINE (BENADRYL) 25 MG tablet Take 25 mg by mouth at bedtime as needed for sleep.    Yes [provider]  levothyroxine (SYNTHROID, LEVOTHROID) 88 MCG tablet Take 88 mcg by mouth every morning. 04/17/17  Yes [provider]  losartan (COZAAR) 100 MG tablet Take 100 mg by mouth daily. 12/12/18  Yes [provider]  montelukast (SINGULAIR) 10 MG tablet Take 10 mg by mouth at bedtime.  04/16/17  Yes [provider]  Multiple Vitamin (MULTIVITAMIN WITH MINERALS) TABS tablet Take 1 tablet by mouth 2 (two) times daily.   Yes [provider]  omeprazole (PRILOSEC) 40 MG capsule Take 40 mg by mouth 2 (two) times daily. 12/11/18  Yes [provider]  rOPINIRole (REQUIP) 1 MG tablet Take 1 mg by mouth at bedtime. 01/22/19  Yes [provider]  rosuvastatin (CRESTOR) 40 MG tablet Take 20 mg by mouth daily. MWF   Yes [provider]  tacrolimus (PROGRAF) 1 MG capsule Take 1 mg by mouth 2 (two) times daily.  01/03/19  Yes [provider]  Tiotropium Bromide Monohydrate (SPIRIVA RESPIMAT) 2.5 MCG/ACT AERS Inhale 2 puffs into the lungs daily. Rinse mouth after using the inhaler   Yes [provider]  WIXELA INHUB 100-50 MCG/DOSE AEPB Inhale 1 puff into the lungs 2 (two) times daily. 11/11/18  Yes [provider]  acetaminophen (TYLENOL) 500 MG tablet Take 2 tablets (1,000 mg total) by mouth every 6 (six) hours as needed for mild pain. Patient not taking: Reported on 02/07/2019 05/12/17   Doreatha Lew, MD     Family History  Problem Relation Age of Onset  . Lymphoma Father   . Heart disease Paternal Grandfather   . COPD Mother        Husband Smoked    . Stroke Maternal Grandfather   . Rheumatic fever Paternal Grandmother   . Hypertension Sister   . Scoliosis Daughter     Social History   Socioeconomic History  . Marital status: Widowed    Spouse name: Not on file  . Number of children: 2  . Years of education: 59  . Highest education level: Not on file  Occupational History  . Occupation: TEACHER     Employer: [employer]    Comment: RETIRED   Tobacco Use  . Smoking status: Former Smoker    Packs/day: 1.50    Years: 20.00    Pack years: 30.00    Types: Cigarettes    Quit date: 01/12/1973    Years since quitting: 46.1  . Smokeless tobacco: Never Used  Substance and Sexual Activity  . Alcohol use: Yes    Alcohol/week: 0.5 standard drinks    Types: 1 Standard drinks or equivalent per week  Comment: She occasionally drinks a glass of wine.    . Drug use: No  . Sexual activity: Not Currently    Partners: Male  Other Topics Concern  . Not on file  Social History Narrative   Marital Status:  Widowed      Siblings: Kitty Simmons/ Fernanda Drum   Children:  2;  Grandchildren:  5    Pets: None   Living Situation: Lives alone   Occupation: Retired Pharmacist, hospital   Education: Forensic psychologist    Tobacco Use/Exposure:  She quit smoking > 20 years ago after having smoked 1 1/2 ppd for over 20 years.     Alcohol Use:  Occasional (Wine)    Drug Use:  None   Diet:  Regular   Exercise:  Water Aerobic Class 3 days a week and Weights   Hobbies: Reading/ Crafts/ Biking/ Reading]   Social Determinants of Health   Financial Resource Strain:   . Difficulty of Paying Living Expenses: Not on file  Food Insecurity:   . Worried About Charity fundraiser in the Last Year: Not on file  . Ran Out of Food in the Last Year: Not on file  Transportation Needs:   . Lack of Transportation (Medical): Not on file  . Lack of Transportation (Non-Medical): Not on file  Physical Activity:   . Days of Exercise per Week: Not on file  .  Minutes of Exercise per Session: Not on file  Stress:   . Feeling of Stress : Not on file  Social Connections:   . Frequency of Communication with Friends and Family: Not on file  . Frequency of Social Gatherings with Friends and Family: Not on file  . Attends Religious Services: Not on file  . Active Member of Clubs or Organizations: Not on file  . Attends Archivist Meetings: Not on file  . Marital Status: Not on file     Review of Systems: A 12 point ROS discussed and pertinent positives are indicated in the HPI above.  All other systems are negative.  Review of Systems  Constitutional: Negative for fatigue and fever.  Respiratory: Positive for cough and shortness of breath.   Cardiovascular: Negative for chest pain.  Gastrointestinal: Negative for abdominal pain, constipation, diarrhea and nausea.  Genitourinary: Negative for dysuria.  Musculoskeletal: Negative for back pain.  Psychiatric/Behavioral: Negative for behavioral problems and confusion.    Vital Signs: BP 134/76   Pulse 84   Temp 98.1 F (36.7 C) (Oral)   Resp (!) 23   Ht 5\' 6"  (1.676 m)   Wt 200 lb (90.7 kg)   SpO2 96%   BMI 32.28 kg/m   Physical Exam Vitals and nursing note reviewed.  HENT:     Mouth/Throat:     Mouth: Mucous membranes are moist.     Pharynx: Oropharynx is clear.  Cardiovascular:     Rate and Rhythm: Normal rate and regular rhythm.  Pulmonary:     Effort: Pulmonary effort is normal. Tachypnea (mild at rest) present.     Breath sounds: Normal breath sounds.  Abdominal:     Palpations: Abdomen is soft.  Skin:    General: Skin is warm and dry.  Neurological:     General: No focal deficit present.     Mental Status: She is alert and oriented to person, place, and time.  Psychiatric:        Mood and Affect: Mood normal.        Behavior: Behavior  normal.      MD Evaluation Airway: WNL Heart: WNL Abdomen: WNL Chest/ Lungs: WNL ASA  Classification:  3 Mallampati/Airway Score: One   Imaging: DG Chest 2 View  Result Date: 02/06/2019 CLINICAL DATA:  Shortness of breath EXAM: CHEST - 2 VIEW COMPARISON:  10/14/2007 FINDINGS: Hyperinflation. Mild bronchitic changes. No consolidation or effusion. Borderline cardiomegaly. No pneumothorax. IMPRESSION: Hyperinflated lungs with mild bronchitic changes. Negative for pulmonary edema or focal airspace disease. Electronically Signed   By: Donavan Foil M.D.   On: 02/06/2019 23:20   CT Angio Chest PE W and/or Wo Contrast  Addendum Date: 02/07/2019   ADDENDUM REPORT: 02/07/2019 14:05 ADDENDUM: These results were called by telephone at the time of interpretation on 02/07/2019 at 2:04 pm to provider [facility] , who verbally acknowledged these results. Electronically Signed   By: Zetta Bills M.D.   On: 02/07/2019 14:05   Result Date: 02/07/2019 CLINICAL DATA:  PE suspected, shortness of breath. EXAM: CT ANGIOGRAPHY CHEST WITH CONTRAST TECHNIQUE: Multidetector CT imaging of the chest was performed using the standard protocol during bolus administration of intravenous contrast. Multiplanar CT image reconstructions and MIPs were obtained to evaluate the vascular anatomy. CONTRAST:  157mL OMNIPAQUE IOHEXOL 350 MG/ML SOLN COMPARISON:  Previous chest x-ray February 06, 2019 FINDINGS: Cardiovascular: Heart size is enlarged with signs of right heart strain in the setting of bilateral main pulmonary artery and lobar level pulmonary emboli. Clot burden slightly greater on the right than the left. RV to LV ratio of 1.65. Scattered atherosclerotic calcifications in the thoracic aorta. In addition to main pulmonary artery clot there is upper and lower lobe emboli on the right. The lower lobe and segmental upper lobe/lingular branches on the left. Mediastinum/Nodes: No enlarged mediastinal, hilar, or axillary lymph nodes. Thyroid gland, trachea, and esophagus demonstrate no significant findings. Lungs/Pleura: Small nodule in  the right upper lobe 8 mm. (Image 19) No signs of dense consolidation or evidence of pleural effusion. Upper Abdomen: Some reflux of contrast into the hepatic veins may be due to right heart dysfunction in the setting of large volume pulmonary emboli. Moderate hiatal hernia. Musculoskeletal: Lucency in the T4 vertebral body best seen on sagittal images measuring 14 mm. Multilevel degenerative changes. Review of the MIP images confirms the above findings. IMPRESSION: 1. Study is positive for bilateral pulmonary emboli, with signs of right heart strain in the setting of large volume pulmonary emboli. Large volume of pulmonary emboli in the lungs bilaterally, with dilatation of the right atrium and right ventricle (RV to LV ratio of 1.65) indicative of elevated right-sided heart pressures and right heart strain. These findings have been shown to be associated with a increased morbidity and mortality in the setting pulmonary embolism. 2. 8 mm right upper lobe pulmonary nodule. Non-contrast chest CT at 6-12 months is recommended. If the nodule is stable at time of repeat CT, then future CT at 18-24 months (from today's scan) is considered optional for low-risk patients, but is recommended for high-risk patients. This recommendation follows the consensus statement: Guidelines for Management of Incidental Pulmonary Nodules Detected on CT Images: From the Fleischner Society 2017; Radiology 2017; 284:228-243. 3. 14 mm lucent lesion in the T4 vertebral body, indeterminate., may represent an atypical vertebral hemangioma. Comparison with more remote priors could be helpful. Alternatively follow-up spinal MRI could be useful. 4. Moderate hiatal hernia. 5. Aortic atherosclerosis. 6. Critical Value/emergent results were called by telephone at the time of interpretation on 02/07/2019 at 1:23 pm to [place] ,  who verbally acknowledged these results. Aortic Atherosclerosis (ICD10-I70.0). Electronically Signed: By: Zetta Bills M.D. On: 02/07/2019 13:38   ECHOCARDIOGRAM COMPLETE  Result Date: 02/08/2019   ECHOCARDIOGRAM REPORT   Patient Name:   YELISSA AVELLO Date of Exam: 02/08/2019 Medical Rec #:  [id number]              Height:       66.0 in Accession #:    [id number]             Weight:       200.0 lb Date of Birth:  1941-01-02              BSA:          2.00 m Patient Age:    84 years               BP:           159/97 mmHg Patient Gender: F                      HR:           91 bpm. Exam Location:  Inpatient Procedure: 2D Echo, Cardiac Doppler and Color Doppler STAT ECHO Indications:    Dyspnea 786.09/R06.00  History:        Patient has no prior history of Echocardiogram examinations.                 COPD, Signs/Symptoms:Murmur; Risk Factors:Hypertension and                 Dyslipidemia. GERD. Cancer. Hypothyroidism.  Sonographer:    Tiffany Dance Referring Phys: [id number] [place]  1. RV is severely dilated with moderately reduced function. Findings consistent with acute RV failure in setting of submassive PE.  2. Global right ventricle has moderately reduced systolic function.The right ventricular size is severely enlarged. No increase in right ventricular wall thickness.  3. Right ventricular pressure overload.  4. 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  5. Left ventricular ejection fraction, by visual estimation, is 60 to 65%. The left ventricle has normal function. There is borderline left ventricular hypertrophy.  6. Left ventricular diastolic parameters are consistent with Grade I diastolic dysfunction (impaired relaxation).  7. The left ventricle demonstrates regional wall motion abnormalities.  8. Left atrial size was normal.  9. Right atrial size was mild-moderately dilated. 10. Presence of pericardial fat pad. 11. Trivial pericardial effusion is present. 12. The mitral valve is grossly normal. No evidence of mitral valve regurgitation. 13. The tricuspid valve is grossly  normal. 14. The aortic valve was not well visualized. Aortic valve regurgitation is not visualized. No evidence of aortic valve sclerosis or stenosis. 15. The pulmonic valve was grossly normal. Pulmonic valve regurgitation is not visualized. 16. Moderately elevated pulmonary artery systolic pressure. 17. The tricuspid regurgitant velocity is 3.63 m/s, and with an assumed right atrial pressure of 8 mmHg, the estimated right ventricular systolic pressure is moderately elevated at 60.8 mmHg. 18. The inferior vena cava is dilated in size with >50% respiratory variability, suggesting right atrial pressure of 8 mmHg. 19. Changes from prior study are noted. FINDINGS  Left Ventricle: Left ventricular ejection fraction, by visual estimation, is 60 to 65%. The left ventricle has normal function. The left ventricle demonstrates regional wall motion abnormalities. The left ventricular internal cavity size was the left ventricle is normal in size. There is borderline left ventricular hypertrophy. The interventricular septum  is flattened in systole, consistent with right ventricular pressure overload. Left ventricular diastolic parameters are consistent with Grade I diastolic dysfunction (impaired relaxation). Right Ventricle: The right ventricular size is severely enlarged. No increase in right ventricular wall thickness. Global RV systolic function is has moderately reduced systolic function. The tricuspid regurgitant velocity is 3.63 m/s, and with an assumed right atrial pressure of 8 mmHg, the estimated right ventricular systolic pressure is moderately elevated at 60.8 mmHg. 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 RV is severely dilated with moderately reduced function. Findings consistent with acute RV failure in setting of submassive PE. Left Atrium: Left atrial size was normal in size. Right Atrium: Right atrial size was mild-moderately dilated Pericardium: Trivial pericardial effusion is present.  Presence of pericardial fat pad. Mitral Valve: The mitral valve is grossly normal. No evidence of mitral valve regurgitation. Tricuspid Valve: The tricuspid valve is grossly normal. Tricuspid valve regurgitation is mild. Aortic Valve: The aortic valve was not well visualized. Aortic valve regurgitation is not visualized. The aortic valve is structurally normal, with no evidence of sclerosis or stenosis. Mild aortic valve annular calcification. Pulmonic Valve: The pulmonic valve was grossly normal. Pulmonic valve regurgitation is not visualized. Pulmonic regurgitation is not visualized. Aorta: The aortic root and ascending aorta are structurally normal, with no evidence of dilitation. Venous: The inferior vena cava is dilated in size with greater than 50% respiratory variability, suggesting right atrial pressure of 8 mmHg. IAS/Shunts: No atrial level shunt detected by color flow Doppler.  LEFT VENTRICLE PLAX 2D LVIDd:         3.70 cm LVIDs:         2.60 cm LV PW:         1.20 cm LV IVS:        1.10 cm LVOT diam:     1.70 cm LV SV:         34 ml LV SV Index:   16.06 LVOT Area:     2.27 cm  LEFT ATRIUM         Index LA diam:    4.10 cm 2.05 cm/m  TRICUSPID VALVE TR Peak grad:   52.8 mmHg TR Vmax:        363.17 cm/s  SHUNTS Systemic Diam: 1.70 cm  Eleonore Chiquito MD Electronically signed by Eleonore Chiquito MD Signature Date/Time: 02/08/2019/10:10:13 AM    Final     Labs:  CBC: Recent Labs    02/06/19 2302 02/08/19 0452  WBC 10.4 12.0*  HGB 11.8* 11.5*  HCT 39.1 37.1  PLT 288 255    COAGS: No results for input(s): INR, APTT in the last 8760 hours.  BMP: Recent Labs    02/06/19 2302 02/08/19 0452  NA 142 140  K 4.2 4.1  CL 105 103  CO2 24 26  GLUCOSE 188* 177*  BUN 17 18  CALCIUM 9.1 8.7*  CREATININE 1.22* 1.19*  GFRNONAA 42* 43*  GFRAA 49* 50*    LIVER FUNCTION TESTS: No results for input(s): BILITOT, AST, ALT, ALKPHOS, PROT, ALBUMIN in the last 8760 hours.  TUMOR MARKERS: No results  for input(s): AFPTM, CEA, CA199, CHROMGRNA in the last 8760 hours.  Assessment and Plan: Pulmonary embolus Patient with history of COPD, minimal change disease experienced acute worsening of her baseline shortness of breath and presented to the ED 12/29.  She is found to have bilateral PE with right heart strain. This is confirmed by ECHO Patient stable with minimal dyspnea at rest on  3-4L [place], however worsened symptoms with exertion. PCCM has evaluated the patient and recommends EKOS lysis.  IR consulted for same.  Discussed with patient who is agreeable.  Her SCr is 1.19 Currently on heparin drip.  Risks and benefits discussed with the patient including, but not limited to bleeding, possible life threatening bleeding and need for blood product transfusion, vascular injury, stroke, contrast induced renal failure, limb loss and infection.  All of the patient's questions were answered, patient is agreeable to proceed. Consent signed and in chart.   Thank you for this interesting consult.  I greatly enjoyed meeting Zahia Mcadory and look forward to participating in their care.  A copy of this report was sent to the requesting provider on this date.  Electronically Signed: Docia Barrier, PA 02/08/2019, 1:26 PM   I spent a total of 40 Minutes    in face to face in clinical consultation, greater than 50% of which was counseling/coordinating care for pulmonary embolus.

## 2019-02-08 NOTE — Consult Note (Signed)
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  About 1 month ago she fractured her left ankle, nonoperative management.  Since then has noticed progressive shortness of breath now present with even minimal exertion.  She had an episode of dizziness prompting ER visit.  She had troponin leak, elevated BNP, septal flattening on CTA, and echo findings of RV overload.  Pulmonology was consulted as to whether the patient will be a good candidate for catheter directed thrombolysis.  The patient is normally ambulatory, has a history of hypertension and COPD on Incruse.  She is on no oxygen at home.  Other labs notable for a elevated hemoglobin A1c, probably a result of her recent steroid induction.  She has no history of bleeding issues.  She has no history of stroke.  She is not on any blood thinners prior to admission.  Overall she thinks she feels mildly better from admission, he still gets short of breath minimal exertion.    Positive Symptoms in bold:  Constitutional fevers, chills, weight loss, fatigue, anorexia, malaise  Eyes decreased vision, double vision, eye irritation  Ears, Nose, Mouth, Throat sore throat, trouble swallowing, sinus congestion  Cardiovascular chest pain, paroxysmal nocturnal dyspnea, lower ext edema, palpitations   Respiratory SOB, cough, DOE, hemoptysis, wheezing  Gastrointestinal nausea, vomiting, diarrhea  Genitourinary burning with urination, trouble urinating  Musculoskeletal joint  aches, joint swelling, back pain  Integumentary  rashes, skin lesions  Neurological focal weakness, focal numbness, trouble speaking, headaches  Psychiatric depression, anxiety, confusion  Endocrine polyuria, polydipsia, cold intolerance, heat intolerance  Hematologic abnormal bruising, abnormal bleeding, unexplained nose bleeds  Allergic/Immunologic recurrent infections, hives, swollen lymph nodes     Past Medical History  Hypertension Diabetes MCNS COPD with no PFTs Asthma as a child Hypothyroidism Hyperlipidemia Arthritis Osteopenia  Significant Hospital Events   02/07/19 admitted  Consults:  Cardiology, PCCM, IR  Procedures:  N/A  Significant Diagnostic Tests:  CTA  IMPRESSION: 1. Study is positive for bilateral pulmonary emboli, with signs of right heart strain in the setting of large volume pulmonary emboli. Large volume of pulmonary emboli in the lungs bilaterally, with dilatation of the right atrium and right ventricle (RV to LV ratio of 1.65) indicative of elevated right-sided heart pressures and right heart strain. These findings have been shown to be associated with a increased morbidity and mortality in the setting pulmonary embolism. 2. 8 mm right upper lobe pulmonary nodule. Non-contrast chest CT at 6-12 months is recommended. If the nodule is stable at time of repeat CT, then future CT at 18-24 months (from today's scan) is considered optional for low-risk patients, but is recommended for high-risk patients.   Echo  1. RV is severely dilated with moderately reduced function. Findings consistent with acute RV failure in setting of submassive PE.  2. Global right ventricle has moderately reduced systolic function.The right ventricular size is severely enlarged. No increase in right ventricular wall thickness.  3. Right ventricular pressure overload.  4. 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  5. Left ventricular ejection fraction,  by visual estimation, is 60 to 65%. The left ventricle has normal function. There is borderline left ventricular hypertrophy.  6. Left ventricular diastolic parameters are consistent with Grade I diastolic dysfunction (impaired relaxation).  7. The left ventricle demonstrates regional wall motion abnormalities.  8. Left atrial size was normal.  9. Right atrial size was mild-moderately dilated. 10. Presence of pericardial fat pad. 11. Trivial pericardial effusion is present. 12. The mitral valve is grossly normal. No evidence of mitral valve regurgitation. 13. The tricuspid valve is grossly normal. 14. The aortic valve was not well visualized. Aortic valve regurgitation is not visualized. No evidence of aortic valve sclerosis or stenosis. 15. The pulmonic valve was grossly normal. Pulmonic valve regurgitation is not visualized. 16. Moderately elevated pulmonary artery systolic pressure. 17. The tricuspid regurgitant velocity is 3.63 m/s, and with an assumed right atrial pressure of 8 mmHg, the estimated right ventricular systolic pressure is moderately elevated at 60.8 mmHg. 18. The inferior vena cava is dilated in size with >50% respiratory variability, suggesting right atrial pressure of 8 mmHg. 19. Changes from prior study are noted.  Micro Data:  N/A  Antimicrobials:  N/A   Interim history/subjective:  Consulted  Objective   Blood pressure (!) 150/77, pulse 89, temperature 98.6 F (37 C), temperature source Oral, resp. rate 16, height 5\' 6"  (1.676 m), weight 90.7 kg, SpO2 94 %.        Intake/Output Summary (Last 24 hours) at 02/08/2019 1138 Last data filed at 02/08/2019 0400 Gross per 24 hour  Intake 426.78 ml  Output --  Net 426.78 ml   Filed Weights   02/07/19 1300  Weight: 90.7 kg    Examination: General: Elderly woman lying in bed, mildly tachypneic HENT: Mucous members moist, no thrush, trachea midline Lungs: Lung sounds are clear bilaterally Cardiovascular:  Heart sounds are regular, she is a prominent P2, extremities warm Abdomen: Abdomen soft, positive bowel sounds Extremities: She has trace left greater than right edema Neuro: Moves all 4 extremities to command, good insight Skin: no rashes  Troponin leak to 347 Hemoglobin 11 Platelets 255  Resolved Hospital Problem list   N/A  Assessment & Plan:  # High risk submassive pulmonary embolism- caused by post-ankle fracture immobility and underlying nephrotic syndrome.  Class 4-5 PESI score depending on RR.  No contraindications to lytics that I can see. Discussed the risks and benefits of EKOS directed TPA with patient vs. AC alone.  She is interested in proceeding with this. 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

## 2019-02-08 NOTE — Progress Notes (Signed)
  Echocardiogram 2D Echocardiogram has been performed.  [name] G Lateef [name] 02/08/2019, 9:05 AM

## 2019-02-08 NOTE — Progress Notes (Signed)
ANTICOAGULATION CONSULT NOTE - Follow Up Consult  Pharmacy Consult for heparin Indication: pulmonary embolus  Labs: Recent Labs    02/06/19 2302 02/07/19 1503 02/07/19 1700 02/07/19 2030 02/08/19 0452  HGB 11.8*  --   --   --  11.5*  HCT 39.1  --   --   --  37.1  PLT 288  --   --   --  255  HEPARINUNFRC  --   --   --  0.82* 0.57  CREATININE 1.22*  --   --   --   --   TROPONINIHS  --  347* 243*  --   --      Assessment/Plan:  78yo female therapeutic on heparin after rate change. Will continue gtt at current rate and confirm stable with additional level.   Wynona Neat, PharmD, BCPS  02/08/2019,5:43 AM

## 2019-02-08 NOTE — Progress Notes (Signed)
R arm PIV not amenable to drawing labs after much manipulation and effort. Change Heparin infusion to R arm PIV, drew back 10 ml waste on L AC PIV, flushed, and withdrew another 3 ml waste and drew labs for it.

## 2019-02-08 NOTE — Progress Notes (Signed)
[place] for heparin Indication: pulmonary embolus  Heparin Dosing Weight: 79.1 kg  Labs: Recent Labs    02/06/19 2302 02/07/19 2030 02/08/19 0452  HGB 11.8*  --  11.5*  HCT 39.1  --  37.1  PLT 288  --  255  HEPARINUNFRC  --  0.82* 0.57  CREATININE 1.22*  --  1.19*    Assessment: 5 yof presenting with increasing SOB, found to have acute bilateral PE with moderate clot burden and RHS  Planned catheter directed thrombolysis this afternoon  To resume heparin (initially was changed to apixaban but was not given)  Goal of Therapy:  Heparin level 0.3-0.7 units/ml Monitor platelets by anticoagulation protocol: Yes   Plan:  Resume heparin 1300 units/hr 1830 HL Will follow q6h lvls when on lysis  Barth Kirks, PharmD, BCPS, BCCCP Clinical Pharmacist [phone]  Please check AMION for all [place] numbers  02/08/2019 12:32 PM

## 2019-02-08 NOTE — Progress Notes (Signed)
Pulmonary Artery Pressure:  57/34 (43)

## 2019-02-08 NOTE — Progress Notes (Signed)
eLink Physician-Brief Progress Note Patient Name: [name] DOB: August 16, 1940 MRN: [id number]   Date of Service  02/08/2019  HPI/Events of Note  Patient had TPA thrombolysis for sub-massive PE earlier today and is bleeding from vascular access site  eICU Interventions  Thrombi pad + pressure dressing ordered, RN notified IR attending physician.        [name] [name] 02/08/2019, 11:25 PM

## 2019-02-08 NOTE — Progress Notes (Signed)
Initial Nutrition Assessment  DOCUMENTATION CODES:   Obesity unspecified  INTERVENTION:   Once diet advanced, recommend Ensure MAX Protein po BID, each supplement provides 150 kcal and 30 grams of protein  NUTRITION DIAGNOSIS:   Inadequate oral intake related to poor appetite as evidenced by per patient/family report.  GOAL:   Patient will meet greater than or equal to 90% of their needs  MONITOR:   Diet advancement, PO intake, Weight trends, Labs, I & O's  REASON FOR ASSESSMENT:   Malnutrition Screening Tool    ASSESSMENT:   78 y.o. female with history of asthma, GERD, HLD, HTN, COPD, and minimal change disease who presented to [facility] ED with shortness of breath.  Patient states she noticed increased shortness of breath in the Fall of this year which has been stable until recent events.  She fell and broke her ankle ~1 month ago and since then has been immobilized and sedentary.  She reports her shortness of breath continued to worsen over the past 1 month until minimal exertion caused increased work of breathing.  She presented to the ED for evaluation and was found to have a large PE with heart stain.  **RD working remotely**  Patient with increased SOB PTA. Pt has had poor appetite since breaking her ankle ~1 month ago.    Pt was on a 2gm sodium diet but no PO as been documented. Pt was made NPO today and transferred to 83M for EKOS lysis via IR.   Per patient report, has gained fluid of ~15 lbs over the past few months. No weight changes noted in weight history.  I/Os: +527 ml since admit  Medications reviewed. Labs reviewed: CBGs: 140  NUTRITION - FOCUSED PHYSICAL EXAM:  Working remotely.  Diet Order:   Diet Order            Diet NPO time specified  Diet effective now              EDUCATION NEEDS:   No education needs have been identified at this time  Skin:  Skin Assessment: Reviewed RN Assessment  Last BM:  12/29  Height:   Ht Readings from Last  1 Encounters:  02/07/19 5\' 6"  (1.676 m)    Weight:   Wt Readings from Last 1 Encounters:  02/07/19 90.7 kg    Ideal Body Weight:  59.1 kg  BMI:  Body mass index is 32.28 kg/m.  Estimated Nutritional Needs:   Kcal:  1700-1900  Protein:  70-80g  Fluid:  1.9L/day   Clayton Bibles, MS, RD, LDN Inpatient Clinical Dietitian Pager: [phone] After Hours Pager: [phone]

## 2019-02-09 ENCOUNTER — Inpatient Hospital Stay (HOSPITAL_COMMUNITY): Payer: Medicare Other

## 2019-02-09 DIAGNOSIS — R739 Hyperglycemia, unspecified: Secondary | ICD-10-CM

## 2019-02-09 DIAGNOSIS — N049 Nephrotic syndrome with unspecified morphologic changes: Secondary | ICD-10-CM

## 2019-02-09 HISTORY — PX: IR THROMB F/U EVAL ART/VEN FINAL DAY (MS): IMG5379

## 2019-02-09 LAB — CBC
HCT: 34 % — ABNORMAL LOW (ref 36.0–46.0)
HCT: 34.9 % — ABNORMAL LOW (ref 36.0–46.0)
HCT: 35 % — ABNORMAL LOW (ref 36.0–46.0)
Hemoglobin: 10.3 g/dL — ABNORMAL LOW (ref 12.0–15.0)
Hemoglobin: 10.7 g/dL — ABNORMAL LOW (ref 12.0–15.0)
Hemoglobin: 11 g/dL — ABNORMAL LOW (ref 12.0–15.0)
MCH: 26.9 pg (ref 26.0–34.0)
MCH: 27.1 pg (ref 26.0–34.0)
MCH: 27.2 pg (ref 26.0–34.0)
MCHC: 30.3 g/dL (ref 30.0–36.0)
MCHC: 30.6 g/dL (ref 30.0–36.0)
MCHC: 31.5 g/dL (ref 30.0–36.0)
MCV: 86.2 fL (ref 80.0–100.0)
MCV: 88.6 fL (ref 80.0–100.0)
MCV: 88.8 fL (ref 80.0–100.0)
Platelets: 177 10*3/uL (ref 150–400)
Platelets: 185 10*3/uL (ref 150–400)
Platelets: 198 10*3/uL (ref 150–400)
RBC: 3.83 MIL/uL — ABNORMAL LOW (ref 3.87–5.11)
RBC: 3.95 MIL/uL (ref 3.87–5.11)
RBC: 4.05 MIL/uL (ref 3.87–5.11)
RDW: 16 % — ABNORMAL HIGH (ref 11.5–15.5)
RDW: 16 % — ABNORMAL HIGH (ref 11.5–15.5)
RDW: 16.3 % — ABNORMAL HIGH (ref 11.5–15.5)
WBC: 11.7 10*3/uL — ABNORMAL HIGH (ref 4.0–10.5)
WBC: 11.9 10*3/uL — ABNORMAL HIGH (ref 4.0–10.5)
WBC: 12 10*3/uL — ABNORMAL HIGH (ref 4.0–10.5)
nRBC: 0 % (ref 0.0–0.2)
nRBC: 0 % (ref 0.0–0.2)
nRBC: 0 % (ref 0.0–0.2)

## 2019-02-09 LAB — BASIC METABOLIC PANEL
Anion gap: 10 (ref 5–15)
BUN: 19 mg/dL (ref 8–23)
CO2: 26 mmol/L (ref 22–32)
Calcium: 8.3 mg/dL — ABNORMAL LOW (ref 8.9–10.3)
Chloride: 102 mmol/L (ref 98–111)
Creatinine, Ser: 1.06 mg/dL — ABNORMAL HIGH (ref 0.44–1.00)
GFR calc Af Amer: 58 mL/min — ABNORMAL LOW (ref >60)
GFR calc non Af Amer: 50 mL/min — ABNORMAL LOW (ref >60)
Glucose, Bld: 183 mg/dL — ABNORMAL HIGH (ref 70–99)
Potassium: 3.7 mmol/L (ref 3.5–5.1)
Sodium: 138 mmol/L (ref 135–145)

## 2019-02-09 LAB — FIBRINOGEN
Fibrinogen: 630 mg/dL — ABNORMAL HIGH (ref 210–475)
Fibrinogen: 636 mg/dL — ABNORMAL HIGH (ref 210–475)
Fibrinogen: 646 mg/dL — ABNORMAL HIGH (ref 210–475)

## 2019-02-09 LAB — HEPARIN LEVEL (UNFRACTIONATED)
Heparin Unfractionated: 0.43 IU/mL (ref 0.30–0.70)
Heparin Unfractionated: 0.49 IU/mL (ref 0.30–0.70)

## 2019-02-09 LAB — GLUCOSE, CAPILLARY
Glucose-Capillary: 176 mg/dL — ABNORMAL HIGH (ref 70–99)
Glucose-Capillary: 154 mg/dL — ABNORMAL HIGH (ref 70–99)
Glucose-Capillary: 115 mg/dL — ABNORMAL HIGH (ref 70–99)
Glucose-Capillary: 137 mg/dL — ABNORMAL HIGH (ref 70–99)

## 2019-02-09 MED ORDER — APIXABAN 5 MG PO TABS
10.0000 mg | ORAL_TABLET | Freq: Two times a day (BID) | ORAL | Status: DC
  Administered 2019-02-09 – 2019-02-13 (×9): 10 mg via ORAL
  Filled 2019-02-09 (×9): qty 2

## 2019-02-09 MED ORDER — LIDOCAINE HCL (CARDIAC) PF 100 MG/5ML IV SOSY
PREFILLED_SYRINGE | INTRAVENOUS | Status: AC
  Administered 2019-02-09: 10 mL
  Filled 2019-02-09: qty 5

## 2019-02-09 MED ORDER — APIXABAN 5 MG PO TABS: 5.0000 mg | ORAL_TABLET | Freq: Two times a day (BID) | ORAL | Status: DC

## 2019-02-09 NOTE — Discharge Instructions (Signed)
Information on my medicine - ELIQUIS (apixaban)  This medication education was reviewed with me or my healthcare representative as part of my discharge preparation.  Why was Eliquis prescribed for you? Eliquis was prescribed to treat blood clots that may have been found in the veins of your legs (deep vein thrombosis) or in your lungs (pulmonary embolism) and to reduce the risk of them occurring again.  What do You need to know about Eliquis ? The starting dose is 10 mg (two 5 mg tablets) taken TWICE daily for the FIRST SEVEN (7) DAYS, then on  02/16/2019  the dose is reduced to ONE 5 mg tablet taken TWICE daily.  Eliquis may be taken with or without food.   Try to take the dose about the same time in the morning and in the evening. If you have difficulty swallowing the tablet whole please discuss with your pharmacist how to take the medication safely.  Take Eliquis exactly as prescribed and DO NOT stop taking Eliquis without talking to the doctor who prescribed the medication.  Stopping may increase your risk of developing a new blood clot.  Refill your prescription before you run out.  After discharge, you should have regular check-up appointments with your healthcare provider that is prescribing your Eliquis.    What do you do if you miss a dose? If a dose of ELIQUIS is not taken at the scheduled time, take it as soon as possible on the same day and twice-daily administration should be resumed. The dose should not be doubled to make up for a missed dose.  Important Safety Information A possible side effect of Eliquis is bleeding. You should call your healthcare provider right away if you experience any of the following: ? Bleeding from an injury or your nose that does not stop. ? Unusual colored urine (red or dark brown) or unusual colored stools (red or black). ? Unusual bruising for unknown reasons. ? A serious fall or if you hit your head (even if there is no bleeding).  Some  medicines may interact with Eliquis and might increase your risk of bleeding or clotting while on Eliquis. To help avoid this, consult your healthcare provider or pharmacist prior to using any new prescription or non-prescription medications, including herbals, vitamins, non-steroidal anti-inflammatory drugs (NSAIDs) and supplements.  This website has more information on Eliquis (apixaban): http://www.eliquis.com/eliquis/home

## 2019-02-09 NOTE — Procedures (Signed)
Pre procedural Dx: Stephanie Coup PE Post procedural Dx: Same  Acquired pressure measurements as follows:   Preprocedural main pulmonary artery -5 7/34; mean - 43 (normal: < 25/10)  Postprocedural main pulmonary artery - 27/20; mean - 24 - Note, postprocedural pulmonary arterial pressure measurement may be slightly inaccurate given postprocedural measurements acquired with patient seated upright given excessive bleeding from the right jugular access site.   IMPRESSION: Technically successful bilateral catheter directed pulmonary arterial thrombolysis with significant reduction (19 mmHg) in pressure within the main pulmonary artery.    Note, postprocedural pulmonary arterial pressure measurement may be slightly inaccurate given postprocedural measurements acquired with patient seated upright given excessive bleeding from the right jugular access site.   Ronny Bacon, MD Pager #: [phone]

## 2019-02-09 NOTE — Progress Notes (Signed)
On reassessment, noted significant BRB at sheath insertion site. Applied pressure and notified ELink. Also notified IR MD on call. Obtained order for thrombi pad and to redress sheath site. Did as instructed with no complications. VSS, no change in status during bleeding episode.

## 2019-02-09 NOTE — Progress Notes (Signed)
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  About 1 month ago she fractured her left ankle, nonoperative management.  Since then has noticed progressive shortness of breath now present with even minimal exertion.  She had an episode of dizziness prompting ER visit.  She had troponin leak, elevated BNP, septal flattening on CTA, and echo findings of RV overload.  Pulmonology was consulted as to whether the patient will be a good candidate for catheter directed thrombolysis.  The patient is normally ambulatory, has a history of hypertension and COPD on Incruse.  She is on no oxygen at home.  Other labs notable for a elevated hemoglobin A1c, probably a result of her recent steroid induction.  She has no history of bleeding issues.  She has no history of stroke.  She is not on any blood thinners prior to admission.  Overall she thinks she feels mildly better from admission, he still gets short of breath minimal exertion.    Positive Symptoms in bold:  Constitutional fevers, chills, weight loss, fatigue, anorexia, malaise  Eyes decreased vision, double vision, eye irritation  Ears, Nose, Mouth, Throat sore throat, trouble swallowing, sinus congestion  Cardiovascular chest pain, paroxysmal nocturnal dyspnea, lower ext edema, palpitations   Respiratory SOB, cough, DOE, hemoptysis, wheezing  Gastrointestinal nausea, vomiting, diarrhea  Genitourinary burning with urination, trouble urinating  Musculoskeletal joint  aches, joint swelling, back pain  Integumentary  rashes, skin lesions  Neurological focal weakness, focal numbness, trouble speaking, headaches  Psychiatric depression, anxiety, confusion  Endocrine polyuria, polydipsia, cold intolerance, heat intolerance  Hematologic abnormal bruising, abnormal bleeding, unexplained nose bleeds  Allergic/Immunologic recurrent infections, hives, swollen lymph nodes     Past Medical History  Hypertension Diabetes MCNS COPD with no PFTs Asthma as a child Hypothyroidism Hyperlipidemia Arthritis Osteopenia  Significant Hospital Events   02/07/19 admitted  Consults:  Cardiology, PCCM, IR  Procedures:  N/A  Significant Diagnostic Tests:  CTA  IMPRESSION: 1. Study is positive for bilateral pulmonary emboli, with signs of right heart strain in the setting of large volume pulmonary emboli. Large volume of pulmonary emboli in the lungs bilaterally, with dilatation of the right atrium and right ventricle (RV to LV ratio of 1.65) indicative of elevated right-sided heart pressures and right heart strain. These findings have been shown to be associated with a increased morbidity and mortality in the setting pulmonary embolism. 2. 8 mm right upper lobe pulmonary nodule. Non-contrast chest CT at 6-12 months is recommended. If the nodule is stable at time of repeat CT, then future CT at 18-24 months (from today's scan) is considered optional for low-risk patients, but is recommended for high-risk patients.   Echo  1. RV is severely dilated with moderately reduced function. Findings consistent with acute RV failure in setting of submassive PE.  2. Global right ventricle has moderately reduced systolic function.The right ventricular size is severely enlarged. No increase in right ventricular wall thickness.  3. Right ventricular pressure overload.  4. 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  5. Left ventricular ejection fraction,  by visual estimation, is 60 to 65%. The left ventricle has normal function. There is borderline left ventricular hypertrophy.  6. Left ventricular diastolic parameters are consistent with Grade I diastolic dysfunction (impaired relaxation).  7. The left ventricle demonstrates regional wall motion abnormalities.  8. Left atrial size was normal.  9. Right atrial size was mild-moderately dilated. 10. Presence of pericardial fat pad. 11. Trivial pericardial effusion is present. 12. The mitral valve is grossly normal. No evidence of mitral valve regurgitation. 13. The tricuspid valve is grossly normal. 14. The aortic valve was not well visualized. Aortic valve regurgitation is not visualized. No evidence of aortic valve sclerosis or stenosis. 15. The pulmonic valve was grossly normal. Pulmonic valve regurgitation is not visualized. 16. Moderately elevated pulmonary artery systolic pressure. 17. The tricuspid regurgitant velocity is 3.63 m/s, and with an assumed right atrial pressure of 8 mmHg, the estimated right ventricular systolic pressure is moderately elevated at 60.8 mmHg. 18. The inferior vena cava is dilated in size with >50% respiratory variability, suggesting right atrial pressure of 8 mmHg. 19. Changes from prior study are noted.  Micro Data:  N/A  Antimicrobials:  N/A   Interim history/subjective:  S/p EKOS catheter directed lytics overnight. She had some bleeding around her catheters overnight, which has resolved with catheter removal this morning. She had broken her foot a few months ago and has been walking in a boot. She has nephrotic syndrome.  Objective   Blood pressure (!) 109/57, pulse 82, temperature 98.1 F (36.7 C), temperature source Oral, resp. rate (!) 22, height 5\' 6"  (1.676 m), weight 90.7 kg, SpO2 100 %.        Intake/Output Summary (Last 24 hours) at 02/09/2019 1448 Last data filed at 02/09/2019 1300 Gross per 24 hour  Intake 1720.07 ml  Output --  Net  1720.07 ml   Filed Weights   02/07/19 1300  Weight: 90.7 kg    Examination: General: Elderly woman lying in bed, mildly tachypneic HENT: Mucous members moist, no thrush, trachea midline Lungs: Lung sounds are clear bilaterally Cardiovascular: Heart sounds are regular, she is a prominent P2, extremities warm Abdomen: Abdomen soft, positive bowel sounds Extremities: She has trace left greater than right edema Neuro: Moves all 4 extremities to command, good insight Skin: no rashes  Troponin leak to 347 Hemoglobin 11 Platelets 255  Resolved Hospital Problem list   N/A  Assessment & Plan:  High risk submassive pulmonary embolism- caused by post-ankle fracture immobility and underlying nephrotic syndrome.  Class 4-5 PESI score depending on RR at admission. S/p EKOS catheters. Needs lifelong AC given non-modifiable risk factor of nephrotic syndrome.  Acute cor pulmonale -needs follow up outpatient echo in 1-3 months to assure resolution -post-hospital follow up with Pulmonary medicine in 1 month- our office will call her to set this up  Pulmonary nodule -in a smoker, warrants 6 mo f/u scan  COPD- on triple therapy as an outpatient -con't Incruse -nebs PRN  Minimal change nephrotic syndrome, CKD II -con't PTA tacrolimus -con't to monitor renal function -avoid nephrotoxic meds -restarting PTA losartan  DM- perhaps steroid induced. A1c 8.4 -no PTA meds; likely needs an outpatient regimen -con't accuchecks with SSI PRN -goal BG 140-180 while admitted  HTN -back on PTA meds  Liekly stable to step down to progressive care unit with tele. PCCM will sign off. Please call with questions.  Julian Hy, DO 02/09/19 3:04 PM [facility]

## 2019-02-09 NOTE — Progress Notes (Addendum)
Subjective:  Patient tolerated EKOS procedure well yesterday without any immediate complications. However, she experienced significant episode of bleeding last night from insertion site of sheath. tPA was discontinued at that time and bleeding controlled with 45 minutes of manual pressure. Sheath removed without complications this morning.   Patient evaluated at bedside this morning. Patient overall feels well and denies any current complaints including shortness of breath, cough, fever, chest pain, leg swelling, or bruising.  Early on 2 L of omental oxygen. She further denies any questions at this time.  Objective:  Vital signs in last 24 hours: Vitals:   02/09/19 0930 02/09/19 1000 02/09/19 1100 02/09/19 1114  BP: 111/69 134/70    Pulse: 80 80 83   Resp: (!) 26 (!) 33 (!) 21   Temp:    98.1 F (36.7 C)  TempSrc:    Oral  SpO2: 96% 97% 98%   Weight:      Height:       Physical Exam  Constitutional: She is oriented to person, place, and time and well-developed, well-nourished, and in no distress. No distress.  HENT:  Head: Normocephalic and atraumatic.  Nasal cannula in place  Eyes: Pupils are equal, round, and reactive to light. Conjunctivae and EOM are normal.  Cardiovascular: Normal rate, regular rhythm and normal heart sounds.  Pulmonary/Chest: Effort normal and breath sounds normal. No respiratory distress. She has no wheezes.  Musculoskeletal:        General: Normal range of motion.     Cervical back: Neck supple.  Neurological: She is alert and oriented to person, place, and time.  Skin: Skin is warm and dry.  Psychiatric: Affect normal.  Nursing note and vitals reviewed.  IR Angiogram Pulmonary Bilateral Selective  Result Date: 02/08/2019 INDICATION: Acute bilateral sub massive pulmonary emboli acute cor pulmonale/right heart strain. Abnormal echo. EXAM: Ultrasound guidance for access Bilateral pulmonary artery catheterizations, angiograms, pressure measurements,  and pulmonary artery infusion catheters to begin PE thrombo lysis COMPARISON:  02/07/2019 MEDICATIONS: 1% lidocaine local ANESTHESIA/SEDATION: Versed 0 mg IV; Fentanyl 50 mcg IV Moderate Sedation Time:  None. The patient was continuously monitored during the procedure by the interventional radiology nurse under my direct supervision. FLUOROSCOPY TIME:  Fluoroscopy Time: 9 minutes 6 seconds (116 mGy). COMPLICATIONS: None immediate. TECHNIQUE: Informed written consent was obtained from the patient after a thorough discussion of the procedural risks, benefits and alternatives. All questions were addressed. Maximal Sterile Barrier Technique was utilized including caps, mask, sterile gowns, sterile gloves, sterile drape, hand hygiene and skin antiseptic. A timeout was performed prior to the initiation of the procedure. Under sterile conditions and local anesthesia, ultrasound micropuncture access performed of the right internal jugular vein. Two punctures were performed. Images obtained for documentation of the patent right internal jugular vein. Over guidewires 2 adjacent 6 French sheaths were inserted. Over Bentson guidewires, C2 catheter utilized to select the pulmonary arteries bilaterally. Limited pulmonary angiograms performed bilaterally. Central pulmonary arteries are patent. Filling defects in the central pulmonary arteries extending into the lower lobes compatible with sub massive bilateral pulmonary emboli. This correlates with the CTA. Over Rosen guidewires, bilateral infusion catheters were advanced with a 10 cm infusion length on the right and at 15 cm infusion length on the left. Position confirmed with fluoroscopy. Images obtained for documentation. Access secured at the right neck site with a sterile dressing. Bilateral PE thrombo lysis protocol initiated for 12 hours. FINDINGS: Limited central pulmonary angiograms confirmed sub massive bilateral pulmonary emboli correlating with  the CTA. Successful  insertion of bilateral pulmonary artery infusion catheters to begin and PE lysis protocol. Initial central left PA pressure measurement: 57/34 (43) IMPRESSION: Successful insertion of bilateral pulmonary artery infusion catheters to initiate PE thrombo lysis. Electronically Signed   By: Jerilynn Mages.  Shick M.D.   On: 02/08/2019 16:33   IR Angiogram Selective Each Additional Vessel  Result Date: 02/08/2019 INDICATION: Acute bilateral sub massive pulmonary emboli acute cor pulmonale/right heart strain. Abnormal echo. EXAM: Ultrasound guidance for access Bilateral pulmonary artery catheterizations, angiograms, pressure measurements, and pulmonary artery infusion catheters to begin PE thrombo lysis COMPARISON:  02/07/2019 MEDICATIONS: 1% lidocaine local ANESTHESIA/SEDATION: Versed 0 mg IV; Fentanyl 50 mcg IV Moderate Sedation Time:  None. The patient was continuously monitored during the procedure by the interventional radiology nurse under my direct supervision. FLUOROSCOPY TIME:  Fluoroscopy Time: 9 minutes 6 seconds (116 mGy). COMPLICATIONS: None immediate. TECHNIQUE: Informed written consent was obtained from the patient after a thorough discussion of the procedural risks, benefits and alternatives. All questions were addressed. Maximal Sterile Barrier Technique was utilized including caps, mask, sterile gowns, sterile gloves, sterile drape, hand hygiene and skin antiseptic. A timeout was performed prior to the initiation of the procedure. Under sterile conditions and local anesthesia, ultrasound micropuncture access performed of the right internal jugular vein. Two punctures were performed. Images obtained for documentation of the patent right internal jugular vein. Over guidewires 2 adjacent 6 French sheaths were inserted. Over Bentson guidewires, C2 catheter utilized to select the pulmonary arteries bilaterally. Limited pulmonary angiograms performed bilaterally. Central pulmonary arteries are patent. Filling defects  in the central pulmonary arteries extending into the lower lobes compatible with sub massive bilateral pulmonary emboli. This correlates with the CTA. Over Rosen guidewires, bilateral infusion catheters were advanced with a 10 cm infusion length on the right and at 15 cm infusion length on the left. Position confirmed with fluoroscopy. Images obtained for documentation. Access secured at the right neck site with a sterile dressing. Bilateral PE thrombo lysis protocol initiated for 12 hours. FINDINGS: Limited central pulmonary angiograms confirmed sub massive bilateral pulmonary emboli correlating with the CTA. Successful insertion of bilateral pulmonary artery infusion catheters to begin and PE lysis protocol. Initial central left PA pressure measurement: 57/34 (43) IMPRESSION: Successful insertion of bilateral pulmonary artery infusion catheters to initiate PE thrombo lysis. Electronically Signed   By: Jerilynn Mages.  Shick M.D.   On: 02/08/2019 16:33   IR Angiogram Selective Each Additional Vessel  Result Date: 02/08/2019 INDICATION: Acute bilateral sub massive pulmonary emboli acute cor pulmonale/right heart strain. Abnormal echo. EXAM: Ultrasound guidance for access Bilateral pulmonary artery catheterizations, angiograms, pressure measurements, and pulmonary artery infusion catheters to begin PE thrombo lysis COMPARISON:  02/07/2019 MEDICATIONS: 1% lidocaine local ANESTHESIA/SEDATION: Versed 0 mg IV; Fentanyl 50 mcg IV Moderate Sedation Time:  None. The patient was continuously monitored during the procedure by the interventional radiology nurse under my direct supervision. FLUOROSCOPY TIME:  Fluoroscopy Time: 9 minutes 6 seconds (116 mGy). COMPLICATIONS: None immediate. TECHNIQUE: Informed written consent was obtained from the patient after a thorough discussion of the procedural risks, benefits and alternatives. All questions were addressed. Maximal Sterile Barrier Technique was utilized including caps, mask, sterile  gowns, sterile gloves, sterile drape, hand hygiene and skin antiseptic. A timeout was performed prior to the initiation of the procedure. Under sterile conditions and local anesthesia, ultrasound micropuncture access performed of the right internal jugular vein. Two punctures were performed. Images obtained for documentation of the patent right internal jugular  vein. Over guidewires 2 adjacent 6 French sheaths were inserted. Over Bentson guidewires, C2 catheter utilized to select the pulmonary arteries bilaterally. Limited pulmonary angiograms performed bilaterally. Central pulmonary arteries are patent. Filling defects in the central pulmonary arteries extending into the lower lobes compatible with sub massive bilateral pulmonary emboli. This correlates with the CTA. Over Rosen guidewires, bilateral infusion catheters were advanced with a 10 cm infusion length on the right and at 15 cm infusion length on the left. Position confirmed with fluoroscopy. Images obtained for documentation. Access secured at the right neck site with a sterile dressing. Bilateral PE thrombo lysis protocol initiated for 12 hours. FINDINGS: Limited central pulmonary angiograms confirmed sub massive bilateral pulmonary emboli correlating with the CTA. Successful insertion of bilateral pulmonary artery infusion catheters to begin and PE lysis protocol. Initial central left PA pressure measurement: 57/34 (43) IMPRESSION: Successful insertion of bilateral pulmonary artery infusion catheters to initiate PE thrombo lysis. Electronically Signed   By: Jerilynn Mages.  Shick M.D.   On: 02/08/2019 16:33   IR US Guide Vasc Access Right  Result Date: 02/08/2019 INDICATION: Acute bilateral sub massive pulmonary emboli acute cor pulmonale/right heart strain. Abnormal echo. EXAM: Ultrasound guidance for access Bilateral pulmonary artery catheterizations, angiograms, pressure measurements, and pulmonary artery infusion catheters to begin PE thrombo lysis  COMPARISON:  02/07/2019 MEDICATIONS: 1% lidocaine local ANESTHESIA/SEDATION: Versed 0 mg IV; Fentanyl 50 mcg IV Moderate Sedation Time:  None. The patient was continuously monitored during the procedure by the interventional radiology nurse under my direct supervision. FLUOROSCOPY TIME:  Fluoroscopy Time: 9 minutes 6 seconds (116 mGy). COMPLICATIONS: None immediate. TECHNIQUE: Informed written consent was obtained from the patient after a thorough discussion of the procedural risks, benefits and alternatives. All questions were addressed. Maximal Sterile Barrier Technique was utilized including caps, mask, sterile gowns, sterile gloves, sterile drape, hand hygiene and skin antiseptic. A timeout was performed prior to the initiation of the procedure. Under sterile conditions and local anesthesia, ultrasound micropuncture access performed of the right internal jugular vein. Two punctures were performed. Images obtained for documentation of the patent right internal jugular vein. Over guidewires 2 adjacent 6 French sheaths were inserted. Over Bentson guidewires, C2 catheter utilized to select the pulmonary arteries bilaterally. Limited pulmonary angiograms performed bilaterally. Central pulmonary arteries are patent. Filling defects in the central pulmonary arteries extending into the lower lobes compatible with sub massive bilateral pulmonary emboli. This correlates with the CTA. Over Rosen guidewires, bilateral infusion catheters were advanced with a 10 cm infusion length on the right and at 15 cm infusion length on the left. Position confirmed with fluoroscopy. Images obtained for documentation. Access secured at the right neck site with a sterile dressing. Bilateral PE thrombo lysis protocol initiated for 12 hours. FINDINGS: Limited central pulmonary angiograms confirmed sub massive bilateral pulmonary emboli correlating with the CTA. Successful insertion of bilateral pulmonary artery infusion catheters to begin  and PE lysis protocol. Initial central left PA pressure measurement: 57/34 (43) IMPRESSION: Successful insertion of bilateral pulmonary artery infusion catheters to initiate PE thrombo lysis. Electronically Signed   By: Jerilynn Mages.  Shick M.D.   On: 02/08/2019 16:33   IR INFUSION THROMBOL ARTERIAL INITIAL (MS)  Result Date: 02/08/2019 INDICATION: Acute bilateral sub massive pulmonary emboli acute cor pulmonale/right heart strain. Abnormal echo. EXAM: Ultrasound guidance for access Bilateral pulmonary artery catheterizations, angiograms, pressure measurements, and pulmonary artery infusion catheters to begin PE thrombo lysis COMPARISON:  02/07/2019 MEDICATIONS: 1% lidocaine local ANESTHESIA/SEDATION: Versed 0 mg IV; Fentanyl 50 mcg  IV Moderate Sedation Time:  None. The patient was continuously monitored during the procedure by the interventional radiology nurse under my direct supervision. FLUOROSCOPY TIME:  Fluoroscopy Time: 9 minutes 6 seconds (116 mGy). COMPLICATIONS: None immediate. TECHNIQUE: Informed written consent was obtained from the patient after a thorough discussion of the procedural risks, benefits and alternatives. All questions were addressed. Maximal Sterile Barrier Technique was utilized including caps, mask, sterile gowns, sterile gloves, sterile drape, hand hygiene and skin antiseptic. A timeout was performed prior to the initiation of the procedure. Under sterile conditions and local anesthesia, ultrasound micropuncture access performed of the right internal jugular vein. Two punctures were performed. Images obtained for documentation of the patent right internal jugular vein. Over guidewires 2 adjacent 6 French sheaths were inserted. Over Bentson guidewires, C2 catheter utilized to select the pulmonary arteries bilaterally. Limited pulmonary angiograms performed bilaterally. Central pulmonary arteries are patent. Filling defects in the central pulmonary arteries extending into the lower lobes  compatible with sub massive bilateral pulmonary emboli. This correlates with the CTA. Over Rosen guidewires, bilateral infusion catheters were advanced with a 10 cm infusion length on the right and at 15 cm infusion length on the left. Position confirmed with fluoroscopy. Images obtained for documentation. Access secured at the right neck site with a sterile dressing. Bilateral PE thrombo lysis protocol initiated for 12 hours. FINDINGS: Limited central pulmonary angiograms confirmed sub massive bilateral pulmonary emboli correlating with the CTA. Successful insertion of bilateral pulmonary artery infusion catheters to begin and PE lysis protocol. Initial central left PA pressure measurement: 57/34 (43) IMPRESSION: Successful insertion of bilateral pulmonary artery infusion catheters to initiate PE thrombo lysis. Electronically Signed   By: Jerilynn Mages.  Shick M.D.   On: 02/08/2019 16:33   IR INFUSION THROMBOL ARTERIAL INITIAL (MS)  Result Date: 02/08/2019 INDICATION: Acute bilateral sub massive pulmonary emboli acute cor pulmonale/right heart strain. Abnormal echo. EXAM: Ultrasound guidance for access Bilateral pulmonary artery catheterizations, angiograms, pressure measurements, and pulmonary artery infusion catheters to begin PE thrombo lysis COMPARISON:  02/07/2019 MEDICATIONS: 1% lidocaine local ANESTHESIA/SEDATION: Versed 0 mg IV; Fentanyl 50 mcg IV Moderate Sedation Time:  None. The patient was continuously monitored during the procedure by the interventional radiology nurse under my direct supervision. FLUOROSCOPY TIME:  Fluoroscopy Time: 9 minutes 6 seconds (116 mGy). COMPLICATIONS: None immediate. TECHNIQUE: Informed written consent was obtained from the patient after a thorough discussion of the procedural risks, benefits and alternatives. All questions were addressed. Maximal Sterile Barrier Technique was utilized including caps, mask, sterile gowns, sterile gloves, sterile drape, hand hygiene and skin  antiseptic. A timeout was performed prior to the initiation of the procedure. Under sterile conditions and local anesthesia, ultrasound micropuncture access performed of the right internal jugular vein. Two punctures were performed. Images obtained for documentation of the patent right internal jugular vein. Over guidewires 2 adjacent 6 French sheaths were inserted. Over Bentson guidewires, C2 catheter utilized to select the pulmonary arteries bilaterally. Limited pulmonary angiograms performed bilaterally. Central pulmonary arteries are patent. Filling defects in the central pulmonary arteries extending into the lower lobes compatible with sub massive bilateral pulmonary emboli. This correlates with the CTA. Over Rosen guidewires, bilateral infusion catheters were advanced with a 10 cm infusion length on the right and at 15 cm infusion length on the left. Position confirmed with fluoroscopy. Images obtained for documentation. Access secured at the right neck site with a sterile dressing. Bilateral PE thrombo lysis protocol initiated for 12 hours. FINDINGS: Limited central pulmonary angiograms confirmed  sub massive bilateral pulmonary emboli correlating with the CTA. Successful insertion of bilateral pulmonary artery infusion catheters to begin and PE lysis protocol. Initial central left PA pressure measurement: 57/34 (43) IMPRESSION: Successful insertion of bilateral pulmonary artery infusion catheters to initiate PE thrombo lysis. Electronically Signed   By: Jerilynn Mages.  Shick M.D.   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 S/p EKOS procedure with TPA administration.  #Bilateral Pulmonary Embolism #Acute R heart failure: CTA chest in the ED  revealed bilateral pulmonary emboli with evidence of right heart strain on TTE. Underwent EKOS procedure yesterday and had some oozing from the IJ site earlier this AM. TPA infusion was discontinued and local pressure and resolved the bleeding. Patient is currently hemodynamically stable and satting well on 2L [place].  -PCCM recommendations appreciated  - Eliquis indefinitely upon d/c. Start 10 mg BID for 7 days followed by 5 mg BID moving forward.  -Will Ambulate patient with without O2 prior to d/c -Telemetry -PCCM to follow-up for echo cardiogram, PFT/COPD management, and pulmonary nodule (see below)    #Pulmonary nodule: 8 mm RUL pulmonary nodule as well as a lesion in her thoracic spine that was found on the CT scan.  -Repeat CT chest in 6-12 months -F/u w/ pulmonology   #New onset T2DM: Hgb A1c was 8.4 on admission.  Patient does not have a history of diabetes but does note that when she started taking steroids for minimal-change disease, he had to be careful of her blood sugars. -Metformin 500 mg daily -SSI   #Hx of COPD #Hx of Asthma -Albuterol 2 puffs q6hrs PRN -Singulair 10 mg daily -Tiotropium bromide monohydrate 2 puffs daily -Benadryl 25 mg nightly   #HTN -Losartan 75 mg daily -HCTZ 12.5 mg daily -IV hydralazine 5 mg every 6 hours as needed  #HLD -Rosuvastatin 20 mg daily  #Hypothyroidism -Synthroid 88 mcg daily  #FEN/GI -Diet: NPO -Fluids: none   #DVT prophylaxis - Heparin drip   #CODE STATUS: FULL  #Dispo: Pending medical course.   LOS: 2 days   Benedetto Goad, Medical Student 02/09/2019, 11:22 AM  Attestation for Student Documentation:  I personally was present and performed or re-performed the history, physical exam and medical decision-making activities of this service and have verified that the service and findings are accurately documented in the student's notes.  Earlene Plater, MD Internal Medicine, PGY1 Pager: [phone]   02/09/2019,2:02 PM

## 2019-02-09 NOTE — Progress Notes (Signed)
Unable to obtain labs via PIV and no stick order still in effect. Obtained verbal order from Dr. Lucile Shutters via [facility] to obtain labs using the right IJ sheath.

## 2019-02-09 NOTE — Progress Notes (Signed)
[facility] Progress Note Patient Name: [name] DOB: 1940/11/15 MRN: [id number]   Date of Service  02/09/2019  HPI/Events of Note  Pt continues to ooze blood from the right IJ site despite Thrombi pad and local pressure.  eICU Interventions  IR ordered discontinuation of TPA infusion, I spoke with vascular surgery who recommended sustained local pressure at the site with TPA discontinued, and if bleeding persists CTA of neck vessels to exclude arterial vascular injury.        Kerry Kass Ariel Dimitri 02/09/2019, 3:14 AM

## 2019-02-09 NOTE — Progress Notes (Signed)
0300 went to check on pt and found right IJ sheath dressing and linen saturated with BRB. Applied pressure again and notified [facility] and IR. IR advised to stop TPA which was 66mL from being completed and switch to NS. [facility]Link MD advised to apply pressure for extended period and then reinforce dressing. Applied pressure on site for 45 mins, reinforced dressing again. VSS, no other change in pt status.

## 2019-02-10 DIAGNOSIS — J449 Chronic obstructive pulmonary disease, unspecified: Secondary | ICD-10-CM

## 2019-02-10 DIAGNOSIS — E785 Hyperlipidemia, unspecified: Secondary | ICD-10-CM

## 2019-02-10 DIAGNOSIS — G959 Disease of spinal cord, unspecified: Secondary | ICD-10-CM

## 2019-02-10 DIAGNOSIS — Z79899 Other long term (current) drug therapy: Secondary | ICD-10-CM

## 2019-02-10 DIAGNOSIS — Z79890 Hormone replacement therapy (postmenopausal): Secondary | ICD-10-CM

## 2019-02-10 DIAGNOSIS — R911 Solitary pulmonary nodule: Secondary | ICD-10-CM

## 2019-02-10 DIAGNOSIS — I110 Hypertensive heart disease with heart failure: Secondary | ICD-10-CM

## 2019-02-10 DIAGNOSIS — E119 Type 2 diabetes mellitus without complications: Secondary | ICD-10-CM

## 2019-02-10 DIAGNOSIS — I50811 Acute right heart failure: Secondary | ICD-10-CM

## 2019-02-10 DIAGNOSIS — Z7951 Long term (current) use of inhaled steroids: Secondary | ICD-10-CM

## 2019-02-10 DIAGNOSIS — E039 Hypothyroidism, unspecified: Secondary | ICD-10-CM

## 2019-02-10 DIAGNOSIS — I2699 Other pulmonary embolism without acute cor pulmonale: Secondary | ICD-10-CM

## 2019-02-10 LAB — BASIC METABOLIC PANEL
Anion gap: 11 (ref 5–15)
BUN: 28 mg/dL — ABNORMAL HIGH (ref 8–23)
CO2: 25 mmol/L (ref 22–32)
Calcium: 8.3 mg/dL — ABNORMAL LOW (ref 8.9–10.3)
Chloride: 102 mmol/L (ref 98–111)
Creatinine, Ser: 1.43 mg/dL — ABNORMAL HIGH (ref 0.44–1.00)
GFR calc Af Amer: 40 mL/min — ABNORMAL LOW (ref >60)
GFR calc non Af Amer: 35 mL/min — ABNORMAL LOW (ref >60)
Glucose, Bld: 132 mg/dL — ABNORMAL HIGH (ref 70–99)
Potassium: 4.1 mmol/L (ref 3.5–5.1)
Sodium: 138 mmol/L (ref 135–145)

## 2019-02-10 LAB — GLUCOSE, CAPILLARY
Glucose-Capillary: 131 mg/dL — ABNORMAL HIGH (ref 70–99)
Glucose-Capillary: 109 mg/dL — ABNORMAL HIGH (ref 70–99)
Glucose-Capillary: 116 mg/dL — ABNORMAL HIGH (ref 70–99)
Glucose-Capillary: 167 mg/dL — ABNORMAL HIGH (ref 70–99)

## 2019-02-10 LAB — CBC
HCT: 30.6 % — ABNORMAL LOW (ref 36.0–46.0)
Hemoglobin: 9.5 g/dL — ABNORMAL LOW (ref 12.0–15.0)
MCH: 27.2 pg (ref 26.0–34.0)
MCHC: 31 g/dL (ref 30.0–36.0)
MCV: 87.7 fL (ref 80.0–100.0)
Platelets: 180 10*3/uL (ref 150–400)
RBC: 3.49 MIL/uL — ABNORMAL LOW (ref 3.87–5.11)
RDW: 16 % — ABNORMAL HIGH (ref 11.5–15.5)
WBC: 10.1 10*3/uL (ref 4.0–10.5)
nRBC: 0 % (ref 0.0–0.2)

## 2019-02-10 LAB — TACROLIMUS LEVEL: Tacrolimus (FK506) - LabCorp: 3 ng/mL (ref 2.0–20.0)

## 2019-02-10 MED ORDER — SODIUM CHLORIDE 0.9 % IV BOLUS
1000.0000 mL | Freq: Once | INTRAVENOUS | Status: AC
  Administered 2019-02-10: 1000 mL via INTRAVENOUS

## 2019-02-10 MED ORDER — OXYCODONE HCL 5 MG PO TABS
5.0000 mg | ORAL_TABLET | Freq: Once | ORAL | Status: AC | PRN
  Administered 2019-02-10: 5 mg via ORAL
  Filled 2019-02-10: qty 1

## 2019-02-10 NOTE — Plan of Care (Signed)

## 2019-02-10 NOTE — Progress Notes (Signed)
Subjective:  Pt seen at the bedside this AM. She is doing well this morning and resting comfortable in bed. She has been able to work with physical therapy yet and we made her aware they will evaluate her today and ambulate with pulseOx. Her ankle pain has improved as well. All her questions and concerns were appropriately addressed.  Objective:  CBC Latest Ref Rng & Units 02/10/2019 02/09/2019 02/09/2019  WBC 4.0 - 10.5 K/uL 10.1 11.7(H) 11.9(H)  Hemoglobin 12.0 - 15.0 g/dL 9.5(L) 10.3(L) 10.7(L)  Hematocrit 36.0 - 46.0 % 30.6(L) 34.0(L) 35.0(L)  Platelets 150 - 400 K/uL 180 185 177   BMP Latest Ref Rng & Units 02/10/2019 02/09/2019 02/08/2019  Glucose 70 - 99 mg/dL 132(H) 183(H) 177(H)  BUN 8 - 23 mg/dL 28(H) 19 18  Creatinine 0.44 - 1.00 mg/dL 1.43(H) 1.06(H) 1.19(H)  Sodium 135 - 145 mmol/L 138 138 140  Potassium 3.5 - 5.1 mmol/L 4.1 3.7 4.1  Chloride 98 - 111 mmol/L 102 102 103  CO2 22 - 32 mmol/L 25 26 26   Calcium 8.9 - 10.3 mg/dL 8.3(L) 8.3(L) 8.7(L)   Vital signs in last 24 hours: Vitals:   02/10/19 0828 02/10/19 0830 02/10/19 0845 02/10/19 0900  BP:  128/69 129/73 106/77  Pulse:  83 85 81  Resp:  19 20 20   Temp:      TempSrc:      SpO2: 95% 93% 93% 93%  Weight:      Height:       Physical Exam General: Comfortable appearing, laying in bed HEENT: NCAT, [place] in place CV: RRR, normal S1 & S2, no murmurs rubs or gallops appreciated PULM: CTAB, no wheezes or crackles appreciated ABD: soft, and non tender in all quadrants NEURO: Alert and oriented, no focal deficits noted  Assessment & Plan by Problem:  In summary, Ms. 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 S/p EKOS procedure with TPA administration.  #Bilateral Pulmonary Embolism #Acute R heart failure: CTA chest  in the ED revealed bilateral pulmonary emboli with evidence of right heart strain on TTE. Underwent EKOS procedure yesterday and had some oozing from the IJ site earlier this AM. TPA infusion was discontinued and local pressure and resolved the bleeding. Patient is currently hemodynamically stable and satting well on 2L [place].  -PCCM recommendations appreciated  - Eliquis indefinitely upon d/c.10 mg BID for 7 days (stop date 1/7) followed by 5 mg BID  -PCCM to sign off today, stable to transfer to progressive w/ telemetry -PT/OT ordered -Ambulate patient today and measure O2 saturations w/ and w/o O2. -PCCM to follow-up for echocardiogram I 1-3 months, PFT/COPD management, and pulmonary nodule (see below)    #Pulmonary nodule: 8 mm RUL pulmonary nodule as well as a lesion in her thoracic spine that was found on the CT scan.  -Repeat CT chest in 6-12 months -F/u w/ pulmonology   #New onset T2DM: Hgb A1c was 8.4 on admission.  Patient does not have a history of diabetes but does note that when she started taking steroids for minimal-change disease, he had to be careful of her blood sugars. -Metformin 500 mg daily -SSI   #Hx of COPD #Hx of Asthma -Albuterol 2 puffs q6hrs PRN -Singulair 10 mg daily -Tiotropium bromide monohydrate 2 puffs daily -Benadryl 25 mg nightly   #  HTN -Losartan 75 mg daily -HCTZ 12.5 mg daily -IV hydralazine 5 mg every 6 hours as needed  #HLD -Rosuvastatin 20 mg daily  #Hypothyroidism -Synthroid 88 mcg daily  #FEN/GI -Diet: Carb modified -Fluids: none   #DVT prophylaxis - Heparin drip   #CODE STATUS: FULL  #Dispo: Pending medical course.   LOS: 3 days   Signed: Earlene Plater, MD Internal Medicine, PGY1 Pager: [phone]  02/10/2019,9:25 AM

## 2019-02-10 NOTE — Evaluation (Signed)
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 S/p EKOS procedure with TPA administration.  Clinical Impression  Patient presents with decreased mobility due to decreased activity tolerance and risk for falls due to rushing when SOB.  Able to get to bathroom then washed hands at sink, then to bed where she realized needing to have BM and returned to bathroom.  Seems faigued with managing this with some help on O2 and previously was managing at home even without the RW.  Feel she will need skilled PT in the acute setting and follow up HHPT if daughter/family able to assist at d/c otherwise may need STSNF.  PT to follow acutely.     Follow Up Recommendations Home health PT;Supervision/Assistance - 24 hour    Equipment Recommendations  None recommended by PT    Recommendations for Other Services       Precautions / Restrictions Precautions Precautions: Fall Precaution Comments: watch O2      Mobility  Bed Mobility Overal bed mobility: Needs Assistance Bed Mobility: Supine to Sit;Sit to Supine     Supine to sit: Supervision Sit to supine: Supervision   General bed mobility comments: assis for lines  Transfers Overall transfer level: Needs assistance Equipment used: Rolling walker (2 wheeled) Transfers: Sit to/from Stand Sit to Stand: Min guard         General transfer comment: assist for balance; up and down x 4 due to two trips to bathroom  Ambulation/Gait Ambulation/Gait assistance: Min guard Gait Distance (Feet): 15 Feet(x 4) Assistive device: Rolling walker (2 wheeled) Gait Pattern/deviations: Step-to  pattern;Step-through pattern;Wide base of support;Trunk flexed     General Gait Details: fatigues quickly on portable O2 with SpO2 90% after ambulation on 1L O2 to bathroom; walked back to bed then needed to have BM so walked back to bathroom then to bed; assist for balance and safety due to multiple lines and fatigues quickly  Stairs            Wheelchair Mobility    Modified Rankin (Stroke Patients Only)       Balance Overall balance assessment: Needs assistance Sitting-balance support: Feet supported Sitting balance-Leahy Scale: Good     Standing balance support: Single extremity supported;Bilateral upper extremity supported;During functional activity Standing balance-Leahy Scale: Fair Standing balance comment: UE support for balance with using railing and doorfacing in bathroom due to RW not fitting in bathroom; but standing at sink to wash hands able to perform without UE support                             Pertinent Vitals/Pain Pain Assessment: No/denies pain    Home Living Family/patient expects to be discharged to:: Private residence Living Arrangements: Alone Available Help at Discharge: Family;Available PRN/intermittently Type of Home: House Home Access: Stairs to enter Entrance Stairs-Rails: None Entrance Stairs-Number of Steps: 2 Home Layout: One level Home Equipment: Walker - 2 wheels;Shower seat;Toilet riser      Prior Function Level of Independence: Independent         Comments: had reached point where she did not need RW with boot, daughter assisting with houskeeping  Hand Dominance        Extremity/Trunk Assessment   Upper Extremity Assessment Upper Extremity Assessment: Overall WFL for tasks assessed    Lower Extremity Assessment Lower Extremity Assessment: LLE deficits/detail LLE Deficits / Details: camboot on L LE otherwise WFL       Communication   Communication: No difficulties  Cognition Arousal/Alertness:  Awake/alert Behavior During Therapy: WFL for tasks assessed/performed Overall Cognitive Status: Within Functional Limits for tasks assessed                                        General Comments General comments (skin integrity, edema, etc.): liquid stool while on toilet in bathroom; reports has been having    Exercises     Assessment/Plan    PT Assessment Patient needs continued PT services  PT Problem List Decreased strength;Decreased activity tolerance;Decreased mobility;Cardiopulmonary status limiting activity;Decreased balance;Decreased knowledge of use of DME;Decreased safety awareness       PT Treatment Interventions Gait training;DME instruction;Therapeutic activities;Stair training;Balance training;Patient/family education;Therapeutic exercise;Functional mobility training    PT Goals (Current goals can be found in the Care Plan section)  Acute Rehab PT Goals Patient Stated Goal: to get stronger to return home PT Goal Formulation: With patient Time For Goal Achievement: 02/24/19 Potential to Achieve Goals: Good    Frequency Min 3X/week   Barriers to discharge        Co-evaluation               AM-PAC PT "6 Clicks" Mobility  Outcome Measure Help needed turning from your back to your side while in a flat bed without using bedrails?: None Help needed moving from lying on your back to sitting on the side of a flat bed without using bedrails?: None Help needed moving to and from a bed to a chair (including a wheelchair)?: A Little Help needed standing up from a chair using your arms (e.g., wheelchair or bedside chair)?: None Help needed to walk in hospital room?: A Little Help needed climbing 3-5 steps with a railing? : A Little 6 Click Score: 21    End of Session Equipment Utilized During Treatment: Oxygen Activity Tolerance: Patient limited by fatigue Patient left: in bed;with call bell/phone within reach   PT Visit Diagnosis: Other  abnormalities of gait and mobility (R26.89);Muscle weakness (generalized) (M62.81)    Time: [id number] PT Time Calculation (min) (ACUTE ONLY): 29 min   Charges:   PT Evaluation $PT Eval Moderate Complexity: 1 Mod PT Treatments $Gait Training: 8-22 mins        Magda Kiel, [place] [facility] Services [phone] 02/10/2019   Reginia Naas 02/10/2019, 5:54 PM

## 2019-02-10 NOTE — Progress Notes (Addendum)
Paged for pain. Patient seen at bedside and has chest pain at left of midclavicular line at the approximately t4 to t5 level. Pain with palpation of this area and on deep breathing. Tylenol has not helped. Denies increase SOB or radiation of pain.   Pulmonary: Lungs clear to auscultation, no wheezes , rhonchi , or rales. Moving good air  Assessment: Pleuritic chest pain.  Plan:  -Considered NSAIDS but Cr trending up. - Oxycodone 5 mg    Addendum:   Patient continues to have left sided chest pain with inspiration. Pain is 6/10.   -EKG - Chest xray

## 2019-02-10 NOTE — Progress Notes (Signed)
Pt. ambulated to the bathroom and back with a front wheel walker and with her LLE ortho boot on. Patient became short of breath and was a little unsteady on her feet. Pt. requested to rest a minute sitting on the bed before attempting to walk in the hall. Upon getting her breath back we attempted to walk around the unit with the walker, however after walking about 20 feet outside the room the patient requested to return to her bed as her legs felt very weak. Pt. was pursed lip breathing upon walking a few feet and appeared mildly anxious. Pt. very hasty and had to be cued to slow down and take her time multiple times. Upon walking the short distance in the hall the patient was bumped up to 4L of O2 (from 1L when resting) and had an O2 sat of 91%, however was noticeably short of breath. MD notified.

## 2019-02-10 NOTE — Progress Notes (Signed)
Internal Medicine Attending:   I saw and examined the patient. I reviewed Dr Alexander's note and I agree with the resident's findings and plan as documented in the resident's note.    

## 2019-02-11 ENCOUNTER — Inpatient Hospital Stay (HOSPITAL_COMMUNITY): Payer: Medicare Other

## 2019-02-11 LAB — CBC
HCT: 28.8 % — ABNORMAL LOW (ref 36.0–46.0)
Hemoglobin: 8.9 g/dL — ABNORMAL LOW (ref 12.0–15.0)
MCH: 26.7 pg (ref 26.0–34.0)
MCHC: 30.9 g/dL (ref 30.0–36.0)
MCV: 86.5 fL (ref 80.0–100.0)
Platelets: 192 10*3/uL (ref 150–400)
RBC: 3.33 MIL/uL — ABNORMAL LOW (ref 3.87–5.11)
RDW: 15.7 % — ABNORMAL HIGH (ref 11.5–15.5)
WBC: 9.3 10*3/uL (ref 4.0–10.5)
nRBC: 0 % (ref 0.0–0.2)

## 2019-02-11 LAB — BASIC METABOLIC PANEL
Anion gap: 16 — ABNORMAL HIGH (ref 5–15)
BUN: 30 mg/dL — ABNORMAL HIGH (ref 8–23)
CO2: 21 mmol/L — ABNORMAL LOW (ref 22–32)
Calcium: 8.5 mg/dL — ABNORMAL LOW (ref 8.9–10.3)
Chloride: 100 mmol/L (ref 98–111)
Creatinine, Ser: 1.29 mg/dL — ABNORMAL HIGH (ref 0.44–1.00)
GFR calc Af Amer: 46 mL/min — ABNORMAL LOW (ref >60)
GFR calc non Af Amer: 39 mL/min — ABNORMAL LOW (ref >60)
Glucose, Bld: 151 mg/dL — ABNORMAL HIGH (ref 70–99)
Potassium: 3.8 mmol/L (ref 3.5–5.1)
Sodium: 137 mmol/L (ref 135–145)

## 2019-02-11 LAB — GLUCOSE, CAPILLARY
Glucose-Capillary: 145 mg/dL — ABNORMAL HIGH (ref 70–99)
Glucose-Capillary: 127 mg/dL — ABNORMAL HIGH (ref 70–99)
Glucose-Capillary: 141 mg/dL — ABNORMAL HIGH (ref 70–99)
Glucose-Capillary: 150 mg/dL — ABNORMAL HIGH (ref 70–99)

## 2019-02-11 MED ORDER — HYDRALAZINE HCL 20 MG/ML IJ SOLN
5.0000 mg | Freq: Four times a day (QID) | INTRAMUSCULAR | Status: DC | PRN
  Administered 2019-02-13: 5 mg via INTRAVENOUS
  Filled 2019-02-11: qty 1

## 2019-02-11 NOTE — Progress Notes (Addendum)
Subjective:  Pt seen at the bedside this AM.  Overnight, the patient was experiencing left-sided chest pain at the midclavicular line at the breast level. The overnight team ordered an EKG and CXR, which did not show any acute processes.  She received a dose of oxycodone 5 mg and was able to go back to sleep. This morning when I visited the patient, she stated that the Tylenol was more effective in controlling her pain than oxycodone. Pain level is currently 4/10.  Reassured the patient that some chest pain during her recovery is expected due to her pulmonary embolisms.  Patient endorsed understanding.  All concerns were addressed.  Of note, the patient request that I call her daughter, [name], to update her. [phone].  Objective:  CBC Latest Ref Rng & Units 02/11/2019 02/10/2019 02/09/2019  WBC 4.0 - 10.5 K/uL 9.3 10.1 11.7(H)  Hemoglobin 12.0 - 15.0 g/dL 8.9(L) 9.5(L) 10.3(L)  Hematocrit 36.0 - 46.0 % 28.8(L) 30.6(L) 34.0(L)  Platelets 150 - 400 K/uL 192 180 185   BMP Latest Ref Rng & Units 02/11/2019 02/10/2019 02/09/2019  Glucose 70 - 99 mg/dL 151(H) 132(H) 183(H)  BUN 8 - 23 mg/dL 30(H) 28(H) 19  Creatinine 0.44 - 1.00 mg/dL 1.29(H) 1.43(H) 1.06(H)  Sodium 135 - 145 mmol/L 137 138 138  Potassium 3.5 - 5.1 mmol/L 3.8 4.1 3.7  Chloride 98 - 111 mmol/L 100 102 102  CO2 22 - 32 mmol/L 21(L) 25 26  Calcium 8.9 - 10.3 mg/dL 8.5(L) 8.3(L) 8.3(L)   Vital signs in last 24 hours: Vitals:   02/10/19 1333 02/10/19 1603 02/10/19 1956 02/11/19 0001  BP: 134/68 139/71 130/66 121/62  Pulse:  86 88 88  Resp:  15 14 20   Temp: 97.9 F (36.6 C) 98.6 F (37 C) 98 F (36.7 C) 97.8 F (36.6 C)  TempSrc: Oral Oral Oral   SpO2:  99% 99% 92%  Weight:      Height:       Physical Exam General: Comfortably laying in bed and watching TV HEENT: NCAT CV: RRR, normal S1-S2 no murmurs rubs or gallops appreciated PULM: Clear in all lung fields, no crackles or wheezes ABD: Bowel sounds present,  soft and nontender to palpation NEURO: Alert and oriented.  No focal deficits  Assessment & Plan by Problem:  In summary, [name] is a 79 y.o female with PMHx significant for COPD, asthma, hypertension, and recently diagnosed minimal change disease who presented with acute on chronic shortness of breath secondary to bilateral pulmonary emboli discovered on CTA chest. S/p EKOS procedure with TPA administration.  #Bilateral Pulmonary Embolism #Acute R heart failure: CTA chest in the ED revealed bilateral pulmonary emboli with evidence of right heart strain on TTE. S/p EKOS procedure with TPA infusion. Patient is currently hemodynamically stable and satting well on 1L [place].  However, when she related with the nurse yesterday, she required 4 L of supplemental O2 for 91% saturation and was very short of breath upon exertion. - Eliquis indefinitely upon d/c.10 mg BID for 7 days (stop date 1/7) followed by 5 mg BID -PT/OT ordered -PCCM to follow-up for echocardiogram I 1-3 months, PFT/COPD management, and pulmonary nodule (see below)    #Pulmonary nodule: 8 mm RUL pulmonary nodule as well as a lesion in her thoracic spine that was found on the CT scan.  -Repeat CT chest in 6-12 months -F/u w/ pulmonology   #New onset T2DM: Hgb A1c was 8.4 on admission.  Patient does not have  a history of diabetes but does note that when she started taking steroids for minimal-change disease, he had to be careful of her blood sugars. -Metformin 500 mg daily -SSI   #Hx of COPD #Hx of Asthma -Albuterol 2 puffs q6hrs PRN -Singulair 10 mg daily -Tiotropium bromide monohydrate 2 puffs daily -Benadryl 25 mg nightly   #HTN -Losartan 75 mg daily -HCTZ 12.5 mg daily -IV hydralazine 5 mg every 6 hours as needed  #HLD -Rosuvastatin 20 mg daily  #Hypothyroidism -Synthroid 88 mcg daily  #FEN/GI -Diet: Carb modified -Fluids: none   #DVT prophylaxis - Heparin drip   #CODE STATUS: FULL  #Dispo: Pending  medical course.   LOS: 4 days   Signed: Earlene Plater, MD Internal Medicine, PGY1 Pager: [phone]  02/11/2019,7:42 AM

## 2019-02-11 NOTE — Progress Notes (Signed)
RN rounding on patients and found pt to be satting at 84% on 2L [place].  RN woke pt up and increased oxygen to 4L.  Pt satting 96% on 4L so decreased to 3L [place].  Current sats on 3L 95%.

## 2019-02-12 LAB — BASIC METABOLIC PANEL
Anion gap: 11 (ref 5–15)
BUN: 25 mg/dL — ABNORMAL HIGH (ref 8–23)
CO2: 25 mmol/L (ref 22–32)
Calcium: 8.7 mg/dL — ABNORMAL LOW (ref 8.9–10.3)
Chloride: 103 mmol/L (ref 98–111)
Creatinine, Ser: 1.15 mg/dL — ABNORMAL HIGH (ref 0.44–1.00)
GFR calc Af Amer: 52 mL/min — ABNORMAL LOW (ref >60)
GFR calc non Af Amer: 45 mL/min — ABNORMAL LOW (ref >60)
Glucose, Bld: 131 mg/dL — ABNORMAL HIGH (ref 70–99)
Potassium: 3.6 mmol/L (ref 3.5–5.1)
Sodium: 139 mmol/L (ref 135–145)

## 2019-02-12 LAB — GLUCOSE, CAPILLARY
Glucose-Capillary: 118 mg/dL — ABNORMAL HIGH (ref 70–99)
Glucose-Capillary: 138 mg/dL — ABNORMAL HIGH (ref 70–99)
Glucose-Capillary: 118 mg/dL — ABNORMAL HIGH (ref 70–99)
Glucose-Capillary: 132 mg/dL — ABNORMAL HIGH (ref 70–99)

## 2019-02-12 LAB — CBC
HCT: 30.2 % — ABNORMAL LOW (ref 36.0–46.0)
Hemoglobin: 9.1 g/dL — ABNORMAL LOW (ref 12.0–15.0)
MCH: 26.8 pg (ref 26.0–34.0)
MCHC: 30.1 g/dL (ref 30.0–36.0)
MCV: 88.8 fL (ref 80.0–100.0)
Platelets: 235 10*3/uL (ref 150–400)
RBC: 3.4 MIL/uL — ABNORMAL LOW (ref 3.87–5.11)
RDW: 15.7 % — ABNORMAL HIGH (ref 11.5–15.5)
WBC: 6.9 10*3/uL (ref 4.0–10.5)
nRBC: 0 % (ref 0.0–0.2)

## 2019-02-12 NOTE — Evaluation (Signed)
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 S/p EKOS procedure with TPA administration.   Clinical Impression   Prior to admission, Pt able to perform ADL at mod I level using DME. Pt today reports that she is using RW, min guard/supervision for bed mobility, transfers, mobility. No problems with LB dressing (donning boot) or performing toilet transfers, peri care, standing grooming. Discussed home set up and O2 management with Pt and daughter. At this time recommending no OT follow up. OT to sign off at this time.    Follow Up Recommendations  No OT follow up    Equipment Recommendations  None recommended by OT(Pt has appropriate DME)    Recommendations for Other Services       Precautions / Restrictions Precautions Precautions: Fall Precaution Comments: watch O2 Restrictions Weight Bearing Restrictions: No      Mobility Bed Mobility Overal bed mobility: Needs Assistance Bed Mobility: Supine to Sit;Sit to Supine     Supine to sit: Supervision Sit to supine: Supervision      Transfers                 General transfer comment: just walked with RN staff, declined at this time    Balance Overall balance assessment: Needs assistance Sitting-balance support: Feet supported Sitting balance-Leahy Scale: Good                                     ADL either performed or assessed with clinical judgement   ADL Overall ADL's : At baseline                                       General ADL Comments: increased O2 needs for mobility, Pt able to perform LB  dressing/bathing, has good home set up, daughter is currently assisting with IADL     Vision Patient Visual Report: No change from baseline       Perception     Praxis      Pertinent Vitals/Pain Pain Assessment: No/denies pain     Hand Dominance Right   Extremity/Trunk Assessment Upper Extremity Assessment Upper Extremity Assessment: Overall WFL for tasks assessed   Lower Extremity Assessment Lower Extremity Assessment: Defer to PT evaluation       Communication Communication Communication: No difficulties   Cognition Arousal/Alertness: Awake/alert Behavior During Therapy: WFL for tasks assessed/performed Overall Cognitive Status: Within Functional Limits for tasks assessed                                     General Comments  Pt on 4L O2 and 99% SpO2, Able to decrease O2 to 1L and educated Pt and daughter on monitoring and increasing prior to mobility.    Exercises     Shoulder Instructions      Home Living Family/patient expects to be discharged to:: Private residence Living Arrangements: Alone Available Help at Discharge: Family;Available PRN/intermittently Type of Home: House  Home Access: Stairs to enter Entrance Stairs-Number of Steps: 2 Entrance Stairs-Rails: None Home Layout: One level     Bathroom Shower/Tub: Occupational psychologist: Standard     Home Equipment: Environmental consultant - 2 wheels;Shower seat;Toilet riser;Hand held shower head;Cane - single point;Bedside commode   Additional Comments: daughter is planning on staying with patient at dc      Prior Functioning/Environment Level of Independence: Independent        Comments: had reached point where she did not need RW with boot, daughter assisting with houskeeping        OT Problem List: Cardiopulmonary status limiting activity;Decreased activity tolerance      OT Treatment/Interventions:      OT Goals(Current goals can be found in the care plan section) Acute  Rehab OT Goals Patient Stated Goal: to get stronger to return home OT Goal Formulation: With patient Time For Goal Achievement: 02/26/19 Potential to Achieve Goals: Good  OT Frequency:     Barriers to D/C:            Co-evaluation              AM-PAC OT "6 Clicks" Daily Activity     Outcome Measure Help from another person eating meals?: None Help from another person taking care of personal grooming?: None Help from another person toileting, which includes using toliet, bedpan, or urinal?: A Little Help from another person bathing (including washing, rinsing, drying)?: None Help from another person to put on and taking off regular upper body clothing?: None Help from another person to put on and taking off regular lower body clothing?: A Little 6 Click Score: 22   End of Session Equipment Utilized During Treatment: Oxygen(4-1L) Nurse Communication: Mobility status;Other (comment)(SpO2)  Activity Tolerance: Patient tolerated treatment well Patient left: in bed;with call bell/phone within reach;with family/visitor present  OT Visit Diagnosis: Muscle weakness (generalized) (M62.81)                Time: [id number] OT Time Calculation (min): 25 min Charges:  OT General Charges $OT Visit: 1 Visit OT Evaluation $OT Eval Moderate Complexity: 1 Mod OT Treatments $Self Care/Home Management : 8-22 mins  [name] OTR/L Acute Rehabilitation Services Pager: [phone] Office: [place] 02/12/2019, 2:03 PM

## 2019-02-12 NOTE — Progress Notes (Signed)
Internal Medicine Attending:   I saw and examined the patient. I reviewed the resident's note and I agree with the resident's findings and plan as documented in the resident's note.   Overall doing well, still requiring supplemental oxygen, will need to arrange for home oxygen delivery and [facility] PT, when these are arranged she can be discharged (likely tomorrow)

## 2019-02-12 NOTE — Progress Notes (Signed)
Subjective:  Pt seen at the bedside this AM.  Patient states the pleuritic chest pain she experienced last night has resolved. She denies any other complaints or questions at this time.  Objective:  CBC Latest Ref Rng & Units 02/12/2019 02/11/2019 02/10/2019  WBC 4.0 - 10.5 K/uL 6.9 9.3 10.1  Hemoglobin 12.0 - 15.0 g/dL 9.1(L) 8.9(L) 9.5(L)  Hematocrit 36.0 - 46.0 % 30.2(L) 28.8(L) 30.6(L)  Platelets 150 - 400 K/uL 235 192 180   BMP Latest Ref Rng & Units 02/12/2019 02/11/2019 02/10/2019  Glucose 70 - 99 mg/dL 131(H) 151(H) 132(H)  BUN 8 - 23 mg/dL 25(H) 30(H) 28(H)  Creatinine 0.44 - 1.00 mg/dL 1.15(H) 1.29(H) 1.43(H)  Sodium 135 - 145 mmol/L 139 137 138  Potassium 3.5 - 5.1 mmol/L 3.6 3.8 4.1  Chloride 98 - 111 mmol/L 103 100 102  CO2 22 - 32 mmol/L 25 21(L) 25  Calcium 8.9 - 10.3 mg/dL 8.7(L) 8.5(L) 8.3(L)   Vital signs in last 24 hours: Vitals:   02/12/19 0250 02/12/19 0356 02/12/19 0740 02/12/19 1227  BP:  133/67 (!) 153/76 (!) 177/75  Pulse: 81 81 78 94  Resp: 18 18 19  (!) 25  Temp:  97.8 F (36.6 C) 98.1 F (36.7 C) 97.8 F (36.6 C)  TempSrc:  Oral Oral Axillary  SpO2: 95% 96% 96% 96%  Weight:      Height:       Physical Exam General: Comfortably laying in bed and watching TV HEENT: NCAT CV: RRR, normal S1-S2 no murmurs rubs or gallops appreciated PULM: Clear in all lung fields, no crackles or wheezes ABD: Bowel sounds present, soft and nontender to palpation NEURO: Alert and oriented.  No focal deficits  Assessment & Plan by Problem:  In summary, [name] is a 79 y.o female with PMHx significant for COPD, asthma, hypertension, and recently diagnosed minimal change disease who presented with acute on chronic shortness of breath secondary to bilateral pulmonary emboli discovered on CTA chest. S/p EKOS procedure with TPA administration.  #Bilateral Pulmonary Embolism #Acute R heart failure: CTA chest in the ED revealed bilateral pulmonary emboli with evidence of  right heart strain on TTE. S/p EKOS procedure with TPA infusion. Patient is currently hemodynamically stable and satting well on 1L .  The patient ambulated with the nurse yesterday and required 4 L of supplemental O2 to get 91% saturations.  - Patient will ambulate with the nurse again today to determine home supplemental O2 requirement - Eliquis indefinitely upon d/c.10 mg BID for 7 days (stop date 1/7) followed by 5 mg BID -PCCM to follow-up for echocardiogram I 1-3 months, PFT/COPD management, and pulmonary nodule (see below)   #Atelectasis: CXR yesterday showed lower lung volumes with streaky bilateral lung base opacities, likely atelectasis. - Incentive spirometry   #Pulmonary nodule: 8 mm RUL pulmonary nodule as well as a lesion in her thoracic spine that was found on the CT scan.  -Repeat CT chest in 6-12 months -F/u w/ pulmonology   #New onset T2DM: Hgb A1c was 8.4 on admission.  Patient does not have a history of diabetes but does note that when she started taking steroids for minimal-change disease, he had to be careful of her blood sugars. -Metformin 500 mg daily -SSI   #Hx of COPD #Hx of Asthma -Albuterol 2 puffs q6hrs PRN -Singulair 10 mg daily -Tiotropium bromide monohydrate 2 puffs daily -Benadryl 25 mg nightly   #HTN -Losartan 75 mg daily -HCTZ 12.5 mg daily -IV hydralazine 5  mg every 6 hours as needed  #HLD -Rosuvastatin 20 mg daily  #Hypothyroidism -Synthroid 88 mcg daily  #FEN/GI -Diet: Carb modified -Fluids: none   #DVT prophylaxis - Heparin drip   #CODE STATUS: FULL  #Dispo: Likely discharge today if able to ambulate well.  PT recommends home health.   LOS: 5 days   [name], MS3  Attestation for Student Documentation:  I personally was present and performed or re-performed the history, physical exam and medical decision-making activities of this service and have verified that the service and findings are accurately documented in the  student's note.  Danielle Plater, MD Internal Medicine, PGY1 Pager: [phone]  02/12/2019,12:33 PM

## 2019-02-12 NOTE — Discharge Summary (Signed)
Name: [name] MRN: [id number] DOB: 1940/09/20 79 y.o. PCP: Jonathon Resides, MD  Date of Admission: 02/06/2019 10:29 PM Date of Discharge:  02/13/2019 Attending Physician: Lucious Groves, DO  Discharge Diagnosis:  1. Bilateral pulmonary emboli 2. Pulmonary nodule 3. New onset Type II Diabetes Mellitus  Discharge Medications: Allergies as of 02/13/2019   No Known Allergies     Medication List    TAKE these medications   acetaminophen 500 MG tablet Commonly known as: TYLENOL Take 2 tablets (1,000 mg total) by mouth every 6 (six) hours as needed for mild pain.   albuterol 108 (90 Base) MCG/ACT inhaler Commonly known as: VENTOLIN HFA Inhale 2 puffs into the lungs every 6 (six) hours as needed for wheezing.   apixaban 5 MG Tabs tablet Commonly known as: ELIQUIS Take 2 tablets (10 mg total) by mouth 2 (two) times daily for 3 days.   apixaban 5 MG Tabs tablet Commonly known as: ELIQUIS Take 1 tablet (5 mg total) by mouth 2 (two) times daily. Start taking on: February 16, 2019   Calcium-Vitamin D 600-400 MG-UNIT Tabs Take 1 tablet by mouth daily.   cholecalciferol 1000 units tablet Commonly known as: VITAMIN D Take 1,000 Units by mouth 2 (two) times daily.   citalopram 20 MG tablet Commonly known as: CELEXA Take 20 mg by mouth at bedtime.   diphenhydrAMINE 25 MG tablet Commonly known as: BENADRYL Take 25 mg by mouth at bedtime as needed for sleep.   levothyroxine 88 MCG tablet Commonly known as: SYNTHROID Take 88 mcg by mouth every morning.   losartan 100 MG tablet Commonly known as: COZAAR Take 100 mg by mouth daily.   metFORMIN 500 MG tablet Commonly known as: GLUCOPHAGE Take 1 tablet (500 mg total) by mouth daily with breakfast.   montelukast 10 MG tablet Commonly known as: SINGULAIR Take 10 mg by mouth at bedtime.   multivitamin with minerals Tabs tablet Take 1 tablet by mouth 2 (two) times daily.   omeprazole 40 MG capsule Commonly  known as: PRILOSEC Take 40 mg by mouth 2 (two) times daily.   rOPINIRole 1 MG tablet Commonly known as: REQUIP Take 1 mg by mouth at bedtime.   rosuvastatin 40 MG tablet Commonly known as: CRESTOR Take 20 mg by mouth daily. MWF   Spiriva Respimat 2.5 MCG/ACT Aers Generic drug: Tiotropium Bromide Monohydrate Inhale 2 puffs into the lungs daily. Rinse mouth after using the inhaler   tacrolimus 1 MG capsule Commonly known as: PROGRAF Take 1 mg by mouth 2 (two) times daily.   Wixela Inhub 100-50 MCG/DOSE Aepb Generic drug: Fluticasone-Salmeterol Inhale 1 puff into the lungs 2 (two) times daily.            Durable Medical Equipment  (From admission, onward)         Start     Ordered   02/13/19 1104  DME Oxygen  Once    Question Answer Comment  Length of Need 6 Months   Liters per Minute 3   Frequency Continuous (stationary and portable oxygen unit needed)   Oxygen conserving device No   Oxygen delivery system Gas      02/13/19 1104         Disposition and follow-up:   Ms.[name] was discharged from [facility] in Good condition.  At the hospital follow up visit please address:   1. Bilateral Pulmonary Emboli: Please ensure pt is taking Eliquis 10 mg BID until  02/16/2019, then start taking Eliquis 5 mg BID moving forward. Please evaluate pt for bleeding.   2.  New found pulmonary nodule; repeat CT scan in 6 months. Follow up with Pulmonology  3.  Newly diagnosed DM with A1c of 8.4. Discharged on Metformin 500 mg daily  Follow up labs: CBC, BMP   Hospital Course by problem list:  #Bilateral Pulmonary Embolism with acute right sided heart failure: Patient presented with progressively worsening shortness of breath after ankle fx in the end of November. Subsequent CTA chest in the ED revealed bilateral pulmonary emboli with evidence of right heart strain on echo. Was put on heparin drip then transitioned to Eleuis 10mg  BID.  IR proceeded  with EKOS on 12/31 without any immediate complications. Patient has remained hemodynamically stable throughout admission satting well on supplemental O2. Discharged with 3L O2. Will require Eliquis indefinitely (10 mg BID until 1/7, then switch to 5 mg BID). PCCM to follow-up for repeat echocardiogram I 1-3 months   #Pulmonary nodule: 8 mm RUL pulmonary nodule as well as a lesion in her thoracic spine that was found on the CT scan.F/u with pulmonology for repeat CT scan in 6-12 months.   #New onset T2DM: Hgb A1c was 8.4 on admission.  Patient does not have a history of diabetes but does note that when she started taking steroids for minimal-change disease, he had to be careful of her blood sugars. Patient's blood glucose well controlled on SSI throughout admission.  -Discharge on metformin 500 mg qd.  Discharge Vitals:   BP (!) 153/76   Pulse 78   Temp 98.1 F (36.7 C) (Oral)   Resp 19   Ht 5\' 6"  (1.676 m)   Wt 90.7 kg   SpO2 96%   BMI 32.28 kg/m   Pertinent Labs, Studies, and Procedures:   CBC Latest Ref Rng & Units 02/12/2019 02/11/2019 02/10/2019  WBC 4.0 - 10.5 K/uL 6.9 9.3 10.1  Hemoglobin 12.0 - 15.0 g/dL 9.1(L) 8.9(L) 9.5(L)  Hematocrit 36.0 - 46.0 % 30.2(L) 28.8(L) 30.6(L)  Platelets 150 - 400 K/uL 235 192 180   BMP Latest Ref Rng & Units 02/12/2019 02/11/2019 02/10/2019  Glucose 70 - 99 mg/dL 131(H) 151(H) 132(H)  BUN 8 - 23 mg/dL 25(H) 30(H) 28(H)  Creatinine 0.44 - 1.00 mg/dL 1.15(H) 1.29(H) 1.43(H)  Sodium 135 - 145 mmol/L 139 137 138  Potassium 3.5 - 5.1 mmol/L 3.6 3.8 4.1  Chloride 98 - 111 mmol/L 103 100 102  CO2 22 - 32 mmol/L 25 21(L) 25  Calcium 8.9 - 10.3 mg/dL 8.7(L) 8.5(L) 8.3(L)    CT Chest Angio: IMPRESSION: 1. Study is positive for bilateral pulmonary emboli, with signs of right heart strain in the setting of large volume pulmonary emboli. Large volume of pulmonary emboli in the lungs bilaterally, with dilatation of the right atrium and right ventricle (RV to  LV ratio of 1.65) indicative of elevated right-sided heart pressures and right heart strain. These findings have been shown to be associated with a increased morbidity and mortality in the setting pulmonary embolism. 2. 8 mm right upper lobe pulmonary nodule. Non-contrast chest CT at 6-12 months is recommended. If the nodule is stable at time of repeat CT, then future CT at 18-24 months (from today's scan) is considered optional for low-risk patients, but is recommended for high-risk patients. This recommendation follows the consensus statement: Guidelines for Management of Incidental Pulmonary Nodules Detected on CT Images: From the Fleischner Society 2017; Radiology 2017; 284:228-243. 3. 14 mm  lucent lesion in the T4 vertebral body, indeterminate., may represent an atypical vertebral hemangioma. Comparison with more remote priors could be helpful. Alternatively follow-up spinal MRI could be useful. 4. Moderate hiatal hernia. 5. Aortic atherosclerosis. 6. Critical Value/emergent results were called by telephone at the time of interpretation on 02/07/2019 at 1:23 pm to providerBOWIE TRAN , who verbally acknowledged these results.  Echocardiogram: IMPRESSIONS   1. RV is severely dilated with moderately reduced function. Findings consistent with acute RV failure in setting of submassive PE.  2. Global right ventricle has moderately reduced systolic function.The right ventricular size is severely enlarged. No increase in right ventricular wall thickness.  3. Right ventricular pressure overload.  4. 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  5. Left ventricular ejection fraction, by visual estimation, is 60 to 65%. The left ventricle has normal function. There is borderline left ventricular hypertrophy.  6. Left ventricular diastolic parameters are consistent with Grade I diastolic dysfunction (impaired relaxation).  7. The left ventricle demonstrates regional wall  motion abnormalities.  8. Left atrial size was normal.  9. Right atrial size was mild-moderately dilated. 10. Presence of pericardial fat pad. 11. Trivial pericardial effusion is present. 12. The mitral valve is grossly normal. No evidence of mitral valve regurgitation. 13. The tricuspid valve is grossly normal. 14. The aortic valve was not well visualized. Aortic valve regurgitation is not visualized. No evidence of aortic valve sclerosis or stenosis. 15. The pulmonic valve was grossly normal. Pulmonic valve regurgitation is not visualized. 16. Moderately elevated pulmonary artery systolic pressure. 17. The tricuspid regurgitant velocity is 3.63 m/s, and with an assumed right atrial pressure of 8 mmHg, the estimated right ventricular systolic pressure is moderately elevated at 60.8 mmHg. 18. The inferior vena cava is dilated in size with >50% respiratory variability, suggesting right atrial pressure of 8 mmHg. 19. Changes from prior study are noted.  Discharge Instructions:  You presented to the Emergency Department for evaluation of acute on chronic shortness of breath and were ultimately found to have blood clots in your lungs (pulmonary emboli). You were subsequently admitted and put on a blood thinner medication (heparin) in order to break this clot. While in the hospital, you underwent an invasive catheter procedure aimed at better breaking down this clot. You tolerated this procedure well.  Please take your prescribed blood thinner medication (Eliquis) as directed and do not stop taking this medication as you are at high risk for recurrent clots. Please call your doctor or present to the emergency department if you experience any bleeding, severe bruising, trauma, black stools, blood in stools, shortness or breath, chest pain, or leg swelling.   Your blood sugars were elevated. Please follow up with your primary care provider for managing your blood sugars moving forward.   If you have  any questions or concerns, please feel free to reach out to [place].   Signed: Benedetto Goad, Medical Student 02/12/2019, 12:11 PM   Pager: [phone]  Attestation for Student Documentation:  I personally was present and performed or re-performed the history, physical exam and medical decision-making activities of this service and have verified that the service and findings are accurately documented in the student's note.  Earlene Plater, MD Internal Medicine, PGY1 Pager: [phone]  02/13/2019,12:01 PM

## 2019-02-12 NOTE — Progress Notes (Signed)
SATURATION QUALIFICATIONS: (This note is used to comply with regulatory documentation for home oxygen)  Patient Saturations on Room Air at Rest = 87%  Patient Saturations on Room Air while Ambulating = 83%  Patient Saturations on 3 Liters of oxygen while Ambulating = 95%  Please briefly explain why patient needs home oxygen: Patient O2 decreases on RA

## 2019-02-13 DIAGNOSIS — Z7984 Long term (current) use of oral hypoglycemic drugs: Secondary | ICD-10-CM

## 2019-02-13 DIAGNOSIS — N050 Unspecified nephritic syndrome with minor glomerular abnormality: Secondary | ICD-10-CM | POA: Diagnosis present

## 2019-02-13 DIAGNOSIS — Z7901 Long term (current) use of anticoagulants: Secondary | ICD-10-CM

## 2019-02-13 DIAGNOSIS — J9601 Acute respiratory failure with hypoxia: Secondary | ICD-10-CM | POA: Diagnosis present

## 2019-02-13 LAB — GLUCOSE, CAPILLARY
Glucose-Capillary: 124 mg/dL — ABNORMAL HIGH (ref 70–99)
Glucose-Capillary: 127 mg/dL — ABNORMAL HIGH (ref 70–99)

## 2019-02-13 MED ORDER — APIXABAN 5 MG PO TABS: 5.0000 mg | ORAL_TABLET | Freq: Two times a day (BID) | ORAL | 3 refills | Status: DC

## 2019-02-13 MED ORDER — METFORMIN HCL 500 MG PO TABS: 500.0000 mg | ORAL_TABLET | Freq: Every day | ORAL | 3 refills | Status: DC

## 2019-02-13 MED ORDER — APIXABAN 5 MG PO TABS: 10.0000 mg | ORAL_TABLET | Freq: Two times a day (BID) | ORAL | 0 refills | Status: DC

## 2019-02-13 MED ORDER — APIXABAN 5 MG PO TABS: 5.0000 mg | ORAL_TABLET | Freq: Two times a day (BID) | ORAL | 6 refills | Status: DC

## 2019-02-13 MED FILL — ELIQUIS STARTER PACK 5 MG T: 5 | 30 days supply | Qty: 74 | Fill #0

## 2019-02-13 NOTE — TOC Transition Note (Signed)
Transition of Care [facility]) - CM/SW Discharge Note   Patient Details  Name: [name] MRN: [id number] Date of Birth: 10/31/1940  [facility]) CM/SW Contact:  Pollie Friar, RN Phone Number: 02/13/2019, 11:08 AM   Clinical Narrative:    Pt is discharging home with [facility] services through [facility]. CAssie with [facility] accepted the referrral.  Pt qualified for home oxygen. Zack with [employer] aware and will arrange for oxygen at the home and for transport home. CM provided her 30 day free Eliquis card. Pt to f/u for cost through her pharmacy. Pt states her daughter is going to stay with her at d/c. She is also going to provide transport home.    Final next level of care: Home w Home Health Services Barriers to Discharge: No Barriers Identified   Patient Goals and CMS Choice   CMS Medicare.gov Compare Post Acute Care list provided to:: Patient Choice offered to / list presented to : Patient  Discharge Placement                       Discharge Plan and Services                DME Arranged: Oxygen DME Agency: [facility] Date DME Agency Contacted: 02/13/19   Representative spoke with at DME Agency: [place]: PT, Nurse's Aide [place] Agency: [facility] [place] Date [place]: 02/13/19   Representative spoke with at [place]: [place] ([place]) Interventions     Readmission Risk Interventions No flowsheet data found.

## 2019-02-13 NOTE — Care Management Important Message (Signed)
Important Message  Patient Details  Name: [name] MRN: [id number] Date of Birth: 1940-10-16   Medicare Important Message Given:  Yes     [name] [name] 02/13/2019, 3:31 PM

## 2019-02-13 NOTE — Progress Notes (Signed)
Physical Therapy Treatment Patient Details Name: [name] MRN: [id number] DOB: 14-Sep-1940 Today's Date: 02/13/2019    History of Present Illness [name] is a 79 y.o female with PMHx significant for COPD, asthma, hypertension, and recently diagnosed minimal change disease who is HD#2 with acute on chronic shortness of breath secondary to bilateral pulmonary emboli discovered on CTA chest. S/p EKOS procedure with TPA administration.    PT Comments    The pt was in bed upon PT arrival, agreeable to PT session focused on functional mobility and ambulation endurance. The pt continues to demo good bed mobility and transfers, she completed a 5X STS in 16 sec without use of RW, and bed mobility without any assist. The pt is able to maintain good SpO2 at rest (98% on 2L), but desats quickly with ambulation, 30 ft with 2-3L and pt desat to 81%. The pt reports significant shortness of breath during ambulation and declined further ambulation at this time due to fatigue even after rest and return of SpO2 to 98% on 2 L. The pt was educated in stair navigation as she is too fatigued at this time to practice stairs, verbalizes understanding. The pt will continue to benefit from skilled PT both in hospital as well as in her home following d/c to maximize functional independence and endurance.     Follow Up Recommendations  Home health PT;Supervision/Assistance - 24 hour     Equipment Recommendations  None recommended by PT    Recommendations for Other Services       Precautions / Restrictions Precautions Precautions: Fall Precaution Comments: watch O2 Restrictions Weight Bearing Restrictions: Yes LLE Weight Bearing: Weight bearing as tolerated    Mobility  Bed Mobility Overal bed mobility: Needs Assistance Bed Mobility: Supine to Sit;Sit to Supine     Supine to sit: Supervision Sit to supine: Supervision   General bed mobility comments: assist for lines  Transfers Overall  transfer level: Needs assistance Equipment used: Rolling walker (2 wheeled) Transfers: Sit to/from Stand Sit to Stand: Min guard         General transfer comment: completed 5x STS in 16 sec with use of BUE  Ambulation/Gait Ambulation/Gait assistance: Min guard Gait Distance (Feet): 30 Feet(77ft, pt declined further ambulation due to fatigue and SOB) Assistive device: Rolling walker (2 wheeled) Gait Pattern/deviations: Step-to pattern;Step-through pattern;Wide base of support;Trunk flexed Gait velocity: decreased Gait velocity interpretation: <1.31 ft/sec, indicative of household ambulator General Gait Details: fatigues quickly on portable O2 with SpO2 90% after ambulation on 1L O2 to bathroom; walked back to bed then needed to have BM so walked back to bathroom then to bed; assist for balance and safety due to multiple lines and fatigues quickly   Stairs             Wheelchair Mobility    Modified Rankin (Stroke Patients Only)       Balance Overall balance assessment: Needs assistance Sitting-balance support: Feet supported Sitting balance-Leahy Scale: Good Sitting balance - Comments: mod I   Standing balance support: Bilateral upper extremity supported;During functional activity Standing balance-Leahy Scale: Fair Standing balance comment: able to complete 5X STS with use of BUE on chair but no RW use, benefits from RW during amb due to fatigue                            Cognition Arousal/Alertness: Awake/alert Behavior During Therapy: WFL for tasks assessed/performed Overall Cognitive Status: Within Functional Limits  for tasks assessed                                        Exercises      General Comments General comments (skin integrity, edema, etc.): Pt on 2L at rest in room SpO2 98%, SpO2 dropped to 81% wth amb on 2L, pt put on 3L for ambulation, recovered to 98%      Pertinent Vitals/Pain Pain Assessment: No/denies pain     Home Living                      Prior Function            PT Goals (current goals can now be found in the care plan section) Acute Rehab PT Goals Patient Stated Goal: to get stronger to return home PT Goal Formulation: With patient Time For Goal Achievement: 02/24/19 Potential to Achieve Goals: Good Progress towards PT goals: Progressing toward goals    Frequency    Min 3X/week      PT Plan Current plan remains appropriate    Co-evaluation              AM-PAC PT "6 Clicks" Mobility   Outcome Measure  Help needed turning from your back to your side while in a flat bed without using bedrails?: None Help needed moving from lying on your back to sitting on the side of a flat bed without using bedrails?: None Help needed moving to and from a bed to a chair (including a wheelchair)?: A Little Help needed standing up from a chair using your arms (e.g., wheelchair or bedside chair)?: None Help needed to walk in hospital room?: A Little Help needed climbing 3-5 steps with a railing? : A Little 6 Click Score: 21    End of Session Equipment Utilized During Treatment: Oxygen;Gait belt Activity Tolerance: Patient limited by fatigue Patient left: with call bell/phone within reach;in chair Nurse Communication: Mobility status PT Visit Diagnosis: Other abnormalities of gait and mobility (R26.89);Muscle weakness (generalized) (M62.81)     Time: [id number] PT Time Calculation (min) (ACUTE ONLY): 25 min  Charges:  $Gait Training: 23-37 mins                     Karma Ganja, PT, DPT   Acute Rehabilitation Department [phone]   Otho Bellows 02/13/2019, 2:14 PM

## 2019-02-13 NOTE — Progress Notes (Signed)
Subjective:  Pt seen at the bedside this AM. She denies any complaints or questions at this time. She feels comfortable going home today.  Objective:  CBC Latest Ref Rng & Units 02/12/2019 02/11/2019 02/10/2019  WBC 4.0 - 10.5 K/uL 6.9 9.3 10.1  Hemoglobin 12.0 - 15.0 g/dL 9.1(L) 8.9(L) 9.5(L)  Hematocrit 36.0 - 46.0 % 30.2(L) 28.8(L) 30.6(L)  Platelets 150 - 400 K/uL 235 192 180   BMP Latest Ref Rng & Units 02/12/2019 02/11/2019 02/10/2019  Glucose 70 - 99 mg/dL 131(H) 151(H) 132(H)  BUN 8 - 23 mg/dL 25(H) 30(H) 28(H)  Creatinine 0.44 - 1.00 mg/dL 1.15(H) 1.29(H) 1.43(H)  Sodium 135 - 145 mmol/L 139 137 138  Potassium 3.5 - 5.1 mmol/L 3.6 3.8 4.1  Chloride 98 - 111 mmol/L 103 100 102  CO2 22 - 32 mmol/L 25 21(L) 25  Calcium 8.9 - 10.3 mg/dL 8.7(L) 8.5(L) 8.3(L)   Vital signs in last 24 hours: Vitals:   02/12/19 2000 02/13/19 0255 02/13/19 0721 02/13/19 0749  BP:   (!) 155/71   Pulse:   78   Resp:   18   Temp: 98.2 F (36.8 C) 98.4 F (36.9 C) 97.9 F (36.6 C)   TempSrc: Oral Oral Oral   SpO2:   95% 96%  Weight:      Height:       Physical Exam General: Comfortably laying in bed and watching TV HEENT: NCAT CV: RRR, normal S1-S2 no murmurs rubs or gallops appreciated PULM: Clear in all lung fields, no crackles or wheezes ABD: Bowel sounds present, soft and nontender to palpation NEURO: Alert and oriented.  No focal deficits  Assessment & Plan by Problem:  In summary, Ms. Navin is a 79 y.o female with PMHx significant for COPD, asthma, hypertension, and recently diagnosed minimal change disease who presented with acute on chronic shortness of breath secondary to bilateral pulmonary emboli discovered on CTA chest. S/p EKOS procedure with TPA administration.  #Bilateral Pulmonary Embolism #Acute R heart failure: CTA chest in the ED revealed bilateral pulmonary emboli with evidence of right heart strain on TTE. S/p EKOS procedure with TPA infusion. Patient is currently  hemodynamically stable and satting well on 1L [place].  The patient ambulated with the nurse yesterday and required 3 L of supplemental O2 to get 95% saturations.  - Will discharge with home O2 therapy - Eliquis indefinitely upon d/c. 10 mg BID for 7 days (stop date 1/7) followed by 5 mg BID -PCCM to follow-up for echocardiogram I 1-3 months, PFT/COPD management, and pulmonary nodule (see below)   #Atelectasis: CXR showed lower lung volumes with streaky bilateral lung base opacities, likely atelectasis. - Incentive spirometry  #Pulmonary nodule: 8 mm RUL pulmonary nodule as well as a lesion in her thoracic spine that was found on the CT scan.  -Repeat CT chest in 6-12 months -F/u w/ pulmonology   #New onset T2DM: Hgb A1c was 8.4 on admission.  Patient does not have a history of diabetes but does note that when she started taking steroids for minimal-change disease, he had to be careful of her blood sugars. -Metformin 500 mg daily -SSI   #Hx of COPD #Hx of Asthma -Albuterol 2 puffs q6hrs PRN -Singulair 10 mg daily -Tiotropium bromide monohydrate 2 puffs daily -Benadryl 25 mg nightly   #HTN -Losartan 75 mg daily -HCTZ 12.5 mg daily -IV hydralazine 5 mg every 6 hours as needed  #HLD -Rosuvastatin 20 mg daily  #Hypothyroidism -Synthroid 88 mcg daily  #  FEN/GI -Diet: Carb modified -Fluids: none   #DVT prophylaxis - Heparin drip   #CODE STATUS: FULL  #Dispo: Likely discharge today if able to ambulate well.  PT recommends home health.   LOS: 6 days   Bebe Liter, MS3  Attestation for Student Documentation:  I personally was present and performed or re-performed the history, physical exam and medical decision-making activities of this service and have verified that the service and findings are accurately documented in the student's note.  Earlene Plater, MD 02/13/2019, 12:00 PM   Earlene Plater, MD Internal Medicine, PGY1 Pager: [phone]  02/13/2019,12:00  PM

## 2019-02-14 ENCOUNTER — Telehealth: Payer: Self-pay | Admitting: *Deleted

## 2019-02-14 NOTE — Telephone Encounter (Signed)
-----   Message from Julian Hy, DO sent at 02/09/2019  2:49 PM EST ----- Regarding: needs clinic follow up Cloretta Ned,  This woman needs a 47-month post-hospital follow up for acute PE. It can be with me or Erskine Emery.  Thanks!  Arby Barrette

## 2019-02-15 NOTE — Telephone Encounter (Signed)
Called and scheduled appt with Dr. Carlis Abbott on 03/16/2019 -pr

## 2019-03-16 ENCOUNTER — Encounter: Payer: Self-pay | Admitting: Critical Care Medicine

## 2019-03-16 ENCOUNTER — Other Ambulatory Visit: Payer: Self-pay

## 2019-03-16 ENCOUNTER — Ambulatory Visit: Payer: Medicare Other | Admitting: Critical Care Medicine

## 2019-03-16 VITALS — BP 140/78 | HR 88 | Temp 97.3°F | Ht 66.0 in | Wt 225.4 lb

## 2019-03-16 DIAGNOSIS — J4540 Moderate persistent asthma, uncomplicated: Secondary | ICD-10-CM

## 2019-03-16 DIAGNOSIS — Z86711 Personal history of pulmonary embolism: Secondary | ICD-10-CM | POA: Diagnosis not present

## 2019-03-16 DIAGNOSIS — R911 Solitary pulmonary nodule: Secondary | ICD-10-CM

## 2019-03-16 DIAGNOSIS — J9611 Chronic respiratory failure with hypoxia: Secondary | ICD-10-CM

## 2019-03-16 MED ORDER — APIXABAN 5 MG PO TABS: 5.0000 mg | ORAL_TABLET | Freq: Two times a day (BID) | ORAL | 11 refills | Status: DC

## 2019-03-16 NOTE — Progress Notes (Signed)
Synopsis: Referred in February 2021 for PE by Jonathon Resides, MD  Subjective:   PATIENT ID: [name] GENDER: female DOB: 28-May-1940, MRN: [id number]  Chief Complaint  Patient presents with  . Consult    Patient feels much better since getting out of the hospital. Patient wears 2L oxygen all the time but would like to try to be taken off.     [name] is a 79 year old woman who presents with her daughter for evaluation after hospitalization for a submassive provoked pulmonary embolus from 12/28-1/4.  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  She received catheter directed thrombolytics while hospitalized and was discharged on Eliquis and has not had bleeding or significant bruising issues.  She was discharged on 2 L supplemental oxygen, which she has continued using.  She has tried taking her oxygen off at home, now most of the time saturates 90% or above with rest, but has dropped as low as 85% when walking.  She has an oxygen saturation log with her.  She has shortness of breath, which is slowly improved some prior to admission.  She has shortness of breath that began in August prior to admission.  She has been using pursed lip breathing to help with her shortness of breath.  No dizziness, lightheadedness, chest pain, syncope.  She previously was diagnosed with exercise-induced asthma, and has been on Wixela, tiotropium, and Singulair prior to hospitalization.  She is never had PFTs in the past.  She smoked 1 to 1.5 packs/day for 20 years before quitting 38 years ago.  She is up-to-date on her vaccines; on the [facility] vaccine wait list for Covid vaccination.   Her daughter is concerned for OSA as she has seen her mother with witnessed apnea episodes when sleeping.     Past Medical History:  Diagnosis Date  . Actinic keratosis   . Arthritis   . Asthma   .  Basal cell carcinoma   . COPD (chronic obstructive pulmonary disease) ([place])   . GERD (gastroesophageal reflux disease)   . Heart murmur    hx of in childhood   . Hyperlipidemia   . Hypertension   . Hypothyroidism   . MCNS (minimal change nephrotic syndrome)   . Osteopenia   . Shortness of breath dyspnea    on exertion   . Squamous cell carcinoma      Family History  Problem Relation Age of Onset  . Lymphoma Father   . Heart disease Paternal Grandfather   . COPD Mother        Husband Smoked   . Stroke Maternal Grandfather   . Rheumatic fever Paternal Grandmother   . Hypertension Sister   . Scoliosis Daughter      Past Surgical History:  Procedure Laterality Date  . ABDOMINAL HYSTERECTOMY    . APPENDECTOMY    . BILATERAL SALPINGOOPHORECTOMY     Ovarian Cysts   . 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 IR ANGIOGRAM PULMONARY BILATERAL SELECTIVE  02/08/2019  . IR ANGIOGRAM SELECTIVE EACH ADDITIONAL VESSEL  02/08/2019  . IR ANGIOGRAM SELECTIVE EACH ADDITIONAL VESSEL  02/08/2019  . IR INFUSION THROMBOL ARTERIAL INITIAL (MS)  02/08/2019  . IR INFUSION THROMBOL ARTERIAL INITIAL (MS)  02/08/2019  . IR THROMB F/U EVAL ART/VEN FINAL DAY (MS)  02/09/2019  . IR US GUIDE VASC ACCESS RIGHT  02/08/2019  . MEDIAL PARTIAL KNEE REPLACEMENT Bilateral   . MOHS SURGERY     x 2  . RENAL BIOPSY     x 2  . ROTATOR CUFF REPAIR Left     Social History   Socioeconomic History  . Marital status: Widowed    Spouse name: Not on file  . Number of children: 2  . Years of education: 70  . Highest education level: Not on file  Occupational History  . Occupation: TEACHER     Employer: [employer]    Comment: RETIRED   Tobacco Use  . Smoking status: Former Smoker    Packs/day: 1.50    Years: 20.00    Pack years: 30.00     Types: Cigarettes    Quit date: 01/12/1973    Years since quitting: 46.2  . Smokeless tobacco: Never Used  Substance and Sexual Activity  . Alcohol use: Yes    Alcohol/week: 0.5 standard drinks    Types: 1 Standard drinks or equivalent per week    Comment: She occasionally drinks a glass of wine.    . Drug use: No  . Sexual activity: Not Currently    Partners: Male  Other Topics Concern  . Not on file  Social History Narrative   Marital Status:  Widowed      Siblings: Kitty Simmons/ Fernanda Drum   Children:  2;  Grandchildren:  5    Pets: None   Living Situation: Lives alone   Occupation: Retired Pharmacist, hospital   Education: Forensic psychologist    Tobacco Use/Exposure:  She quit smoking > 20 years ago after having smoked 1 1/2 ppd for over 20 years.     Alcohol Use:  Occasional (Wine)    Drug Use:  None   Diet:  Regular   Exercise:  Water Aerobic Class 3 days a week and Weights   Hobbies: Reading/ Crafts/ Biking/ Reading]   Social Determinants of Health   Financial Resource Strain:   . Difficulty of Paying Living Expenses: Not on file  Food Insecurity:   . Worried About Charity fundraiser in the Last Year: Not on file  . Ran Out of Food in the Last Year: Not on file  Transportation Needs:   . Lack of Transportation (Medical): Not on file  . Lack of Transportation (Non-Medical): Not on file  Physical Activity:   . Days of Exercise per Week: Not on file  . Minutes of Exercise per Session: Not on file  Stress:   . Feeling of Stress : Not on file  Social Connections:   . Frequency of Communication with Friends and Family: Not on file  . Frequency of Social Gatherings with Friends and Family: Not on file  . Attends Religious Services: Not on file  . Active Member of Clubs or Organizations: Not on file  . Attends Archivist Meetings: Not on file  . Marital Status: Not on file  Intimate Partner Violence:   . Fear of Current or Ex-Partner: Not on file  . Emotionally Abused:  Not on file  . Physically Abused: Not on file  . Sexually Abused: Not on file     No Known Allergies   Immunization History  Administered Date(s) Administered  . Fluad Quad(high Dose 65+) 09/13/2018  . Influenza,inj,Quad PF,6+ Mos 10/09/2016, 10/27/2017  . Influenza-Unspecified 11/09/2013, 10/08/2014, 11/10/2015,  10/09/2016  . Pneumococcal Conjugate-13 12/26/2014, 04/21/2017  . Pneumococcal Polysaccharide-23 02/10/2011  . Tdap 12/09/2010  . Zoster 02/16/2008  . Zoster Recombinat (Shingrix) 04/21/2017, 07/02/2017    Outpatient Medications Prior to Visit  Medication Sig Dispense Refill  . acetaminophen (TYLENOL) 500 MG tablet Take 2 tablets (1,000 mg total) by mouth every 6 (six) hours as needed for mild pain.    Marland Kitchen albuterol (VENTOLIN HFA) 108 (90 Base) MCG/ACT inhaler Inhale 2 puffs into the lungs every 6 (six) hours as needed for wheezing.    . Calcium Carb-Cholecalciferol (CALCIUM-VITAMIN D) 600-400 MG-UNIT TABS Take 1 tablet by mouth daily.     . cholecalciferol (VITAMIN D) 1000 units tablet Take 1,000 Units by mouth 2 (two) times daily.    . citalopram (CELEXA) 20 MG tablet Take 20 mg by mouth at bedtime.   3  . diphenhydrAMINE (BENADRYL) 25 MG tablet Take 25 mg by mouth at bedtime as needed for sleep.     Marland Kitchen levothyroxine (SYNTHROID, LEVOTHROID) 88 MCG tablet Take 88 mcg by mouth every morning.  3  . losartan (COZAAR) 100 MG tablet Take 100 mg by mouth daily.    . metFORMIN (GLUCOPHAGE) 500 MG tablet Take 1 tablet (500 mg total) by mouth daily with breakfast. 30 tablet 3  . montelukast (SINGULAIR) 10 MG tablet Take 10 mg by mouth at bedtime.   3  . Multiple Vitamin (MULTIVITAMIN WITH MINERALS) TABS tablet Take 1 tablet by mouth 2 (two) times daily.    Marland Kitchen omeprazole (PRILOSEC) 40 MG capsule Take 40 mg by mouth 2 (two) times daily.    Marland Kitchen rOPINIRole (REQUIP) 1 MG tablet Take 1 mg by mouth at bedtime.    . rosuvastatin (CRESTOR) 40 MG tablet Take 20 mg by mouth daily. MWF    .  tacrolimus (PROGRAF) 1 MG capsule Take 1 mg by mouth 2 (two) times daily.     . Tiotropium Bromide Monohydrate (SPIRIVA RESPIMAT) 2.5 MCG/ACT AERS Inhale 2 puffs into the lungs daily. Rinse mouth after using the inhaler    . WIXELA INHUB 100-50 MCG/DOSE AEPB Inhale 1 puff into the lungs 2 (two) times daily.    Marland Kitchen apixaban (ELIQUIS) 5 MG TABS tablet Take 2 tablets (10 mg total) by mouth 2 (two) times daily for 3 days. 12 tablet 0  . apixaban (ELIQUIS) 5 MG TABS tablet Take 1 tablet (5 mg total) by mouth 2 (two) times daily. 60 tablet 6   No facility-administered medications prior to visit.    Review of Systems  Respiratory: Positive for shortness of breath. Negative for cough and wheezing.   Cardiovascular: Positive for leg swelling. Negative for chest pain.  Gastrointestinal: Negative for blood in stool.  Genitourinary: Negative for hematuria.  Neurological: Positive for tremors. Negative for dizziness.  Endo/Heme/Allergies: Does not bruise/bleed easily.     Objective:   Vitals:   03/16/19 1142  BP: 140/78  Pulse: 88  Temp: (!) 97.3 F (36.3 C)  TempSrc: Temporal  SpO2: 96%  Weight: 225 lb 6.4 oz (102.2 kg)  Height: 5\' 6"  (1.676 m)   96% on 2 LPM  BMI Readings from Last 3 Encounters:  03/16/19 36.38 kg/m  02/07/19 32.28 kg/m  05/13/17 33.25 kg/m   Wt Readings from Last 3 Encounters:  03/16/19 225 lb 6.4 oz (102.2 kg)  02/07/19 200 lb (90.7 kg)  05/13/17 206 lb (93.4 kg)    Physical Exam Vitals reviewed.  Constitutional:      General: She is not in  acute distress.    Appearance: She is not ill-appearing.  HENT:     Head: Normocephalic and atraumatic.  Eyes:     General: No scleral icterus. Cardiovascular:     Rate and Rhythm: Normal rate and regular rhythm.     Comments: No JVD Pulmonary:     Comments: Mild tachypnea with occasional pursed lip breathing during the visit.  On 2 L supplemental oxygen.  Distant breath sounds bilaterally, no accessory lung  sounds. Abdominal:     General: There is no distension.     Palpations: Abdomen is soft.  Musculoskeletal:        General: No deformity.     Cervical back: Neck supple.     Comments: Bilateral lower extremity edema  Lymphadenopathy:     Cervical: No cervical adenopathy.  Skin:    General: Skin is warm and dry.     Findings: No rash.     Comments: 1 small bruise on her left lower leg  Neurological:     General: No focal deficit present.     Mental Status: She is alert.     Coordination: Coordination normal.  Psychiatric:        Mood and Affect: Mood normal.        Behavior: Behavior normal.      CBC    Component Value Date/Time   WBC 6.9 02/12/2019 0501   RBC 3.40 (L) 02/12/2019 0501   HGB 9.1 (L) 02/12/2019 0501   HCT 30.2 (L) 02/12/2019 0501   PLT 235 02/12/2019 0501   MCV 88.8 02/12/2019 0501   MCH 26.8 02/12/2019 0501   MCHC 30.1 02/12/2019 0501   RDW 15.7 (H) 02/12/2019 0501   LYMPHSABS 1.4 02/06/2019 2302   MONOABS 1.3 (H) 02/06/2019 2302   EOSABS 0.1 02/06/2019 2302   BASOSABS 0.1 02/06/2019 2302    CHEMISTRY No results for input(s): NA, K, CL, CO2, GLUCOSE, BUN, CREATININE, CALCIUM, MG, PHOS in the last 168 hours. CrCl cannot be calculated (Patient's most recent lab result is older than the maximum 21 days allowed.).   Chest Imaging- films reviewed: CTA chest 02/07/2019- centrilobular emphysema in the upper lobes.  RML scarring with 8 mm nodular opacity.  Peripheral scarring in the inferior lingula, LLL.  Bilateral pulmonary emboli tube multiple subsegmental levels.  Reflux of contrast on IVC into liver.  Hiatal hernia.  Pulmonary Functions Testing Results: No flowsheet data found.   Echocardiogram 02/08/2019: LVEF 60 to 65%, borderline LVH with grade 1 diastolic dysfunction.  Normal LA, dilated RV with reduced systolic function and volume and pressure overload.  PASP 60.8 mmHg mild to moderately dilated RA.  Trivial pericardial effusion.  Dilated IVC  with normal variability.  Pulmonary arteriogram 02/09/2019: Acquired pressure measurements as follows:  Preprocedural main pulmonary artery 5 7/34; mean  43 (normal: < 25/10)  Postprocedural main pulmonary artery  27/20; mean  24 (note, postprocedural pulmonary arterial pressure measurement may be slightly inaccurate given postprocedural measurements acquired with patient seated upright given excessive bleeding from the right jugular access site).    Assessment & Plan:     ICD-10-CM   1. Pulmonary nodule  R91.1 CT Chest Wo Contrast  2. History of pulmonary embolus (PE)  Z86.711 Pulmonary function test    ECHOCARDIOGRAM COMPLETE  3. Chronic respiratory failure with hypoxia (HCC)  J96.11 Pulmonary function test    ECHOCARDIOGRAM COMPLETE    CT Chest Wo Contrast    Split night study  4. 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 -Continue supplemental oxygen as prescribed.  Walked in the office today.  She is fine to take her oxygen off at rest as long as they are diligently monitoring her saturations and they remain above 90%.  She should always use her oxygen when walking and sleeping if she continues to have desaturations.  Provoked submassive PE, unfortunately nephrotic syndrome is a no modifiable risk factor raising her future risk for recurrent VTE. -Recommend lifelong anticoagulation.  Needs in 6 months uninterrupted.  We will have ongoing risk-benefit conversations as her health changes over time. -Discussed the risk of spontaneous bleeding and bleeding complications related to [facility].  She should seek medical care for any significant bleeding or head injuries in the future. -Repeat echocardiogram after 3 months to evaluate for resolution of right heart failure. -Continue supplemental oxygen to maintain saturations greater than 90%.  Asthma, likely COPD given her smoking  history -PFTs -Continue current inhalers-Wixela and Spiriva  Pulmonary nodule, history of tobacco abuse -Follow-up CT June 2021  Witnessed apneas, concern for OSA.   -Split-night PSG  Continue Covid precautions.  Recommend the vaccine; she is on the wait list for the [place] Covid vaccine clinic.   RTC in 2 to 3 months after echo and PFTs.   Current Outpatient Medications:  .  acetaminophen (TYLENOL) 500 MG tablet, Take 2 tablets (1,000 mg total) by mouth every 6 (six) hours as needed for mild pain., Disp: , Rfl:  .  albuterol (VENTOLIN HFA) 108 (90 Base) MCG/ACT inhaler, Inhale 2 puffs into the lungs every 6 (six) hours as needed for wheezing., Disp: , Rfl:  .  Calcium Carb-Cholecalciferol (CALCIUM-VITAMIN D) 600-400 MG-UNIT TABS, Take 1 tablet by mouth daily. , Disp: , Rfl:  .  cholecalciferol (VITAMIN D) 1000 units tablet, Take 1,000 Units by mouth 2 (two) times daily., Disp: , Rfl:  .  citalopram (CELEXA) 20 MG tablet, Take 20 mg by mouth at bedtime. , Disp: , Rfl: 3 .  diphenhydrAMINE (BENADRYL) 25 MG tablet, Take 25 mg by mouth at bedtime as needed for sleep. , Disp: , Rfl:  .  levothyroxine (SYNTHROID, LEVOTHROID) 88 MCG tablet, Take 88 mcg by mouth every morning., Disp: , Rfl: 3 .  losartan (COZAAR) 100 MG tablet, Take 100 mg by mouth daily., Disp: , Rfl:  .  metFORMIN (GLUCOPHAGE) 500 MG tablet, Take 1 tablet (500 mg total) by mouth daily with breakfast., Disp: 30 tablet, Rfl: 3 .  montelukast (SINGULAIR) 10 MG tablet, Take 10 mg by mouth at bedtime. , Disp: , Rfl: 3 .  Multiple Vitamin (MULTIVITAMIN WITH MINERALS) TABS tablet, Take 1 tablet by mouth 2 (two) times daily., Disp: , Rfl:  .  omeprazole (PRILOSEC) 40 MG capsule, Take 40 mg by mouth 2 (two) times daily., Disp: , Rfl:  .  rOPINIRole (REQUIP) 1 MG tablet, Take 1 mg by mouth at bedtime., Disp: , Rfl:  .  rosuvastatin (CRESTOR) 40 MG tablet, Take 20 mg by mouth daily. MWF, Disp: , Rfl:  .  tacrolimus (PROGRAF) 1 MG  capsule, Take 1 mg by mouth 2 (two) times daily. , Disp: , Rfl:  .  Tiotropium Bromide Monohydrate (SPIRIVA RESPIMAT) 2.5 MCG/ACT AERS, Inhale 2 puffs into the lungs daily. Rinse mouth after using the inhaler, Disp: , Rfl:  .  WIXELA INHUB 100-50 MCG/DOSE AEPB, Inhale 1 puff into the lungs 2 (two) times daily., Disp: ,  Rfl:  .  apixaban (ELIQUIS) 5 MG TABS tablet, Take 1 tablet (5 mg total) by mouth 2 (two) times daily., Disp: 60 tablet, Rfl: [place] Yoshida, DO [facility] 03/16/2019 2:35 PM

## 2019-03-16 NOTE — Patient Instructions (Addendum)
Thank you for visiting Dr. Carlis Abbott at [facility]. We recommend the following: Orders Placed This Encounter  Procedures  . CT Chest Wo Contrast  . ECHOCARDIOGRAM COMPLETE  . Pulmonary function test   Orders Placed This Encounter  Procedures  . CT Chest Wo Contrast    June 2021    Standing Status:   Future    Standing Expiration Date:   05/13/2020    Order Specific Question:   Preferred imaging location?    Answer:   [facility]    Order Specific Question:   Radiology Contrast Protocol - do NOT remove file path    Answer:   [email or web address]  . ECHOCARDIOGRAM COMPLETE    March 2021    Standing Status:   Future    Standing Expiration Date:   06/12/2020    Order Specific Question:   Where should this test be performed    Answer:   [facility]    Order Specific Question:   Does the patient weigh less than or greater than 250 lbs?    Answer:   Patient weighs less than 250 lbs    Order Specific Question:   Perflutren DEFINITY (image enhancing agent) should be administered unless hypersensitivity or allergy exist    Answer:   Administer Perflutren    Order Specific Question:   Reason for exam-Echo    Answer:   Pulmonary hypertension  416.8 / I27.2  . Pulmonary function test    Standing Status:   Future    Standing Expiration Date:   03/15/2020    Order Specific Question:   Where should this test be performed?    Answer:   [facility]    Order Specific Question:   Full PFT: includes the following: basic spirometry, spirometry pre & post bronchodilator, diffusion capacity (DLCO), lung volumes    Answer:   Full PFT    Meds ordered this encounter  Medications  . apixaban (ELIQUIS) 5 MG TABS tablet    Sig: Take 1 tablet (5 mg total) by mouth 2 (two) times daily.    Dispense:  60 tablet    Refill:  11    Return in about 2 months (around 05/14/2019).    Please do your part to reduce the spread of COVID-19.

## 2019-03-25 ENCOUNTER — Other Ambulatory Visit (HOSPITAL_COMMUNITY)
Admission: RE | Admit: 2019-03-25 | Discharge: 2019-03-25 | Disposition: A | Discharge status: Home / Self Care | Payer: Medicare PPO | Source: Ambulatory Visit | Attending: Internal Medicine | Admitting: Internal Medicine

## 2019-03-25 NOTE — Progress Notes (Signed)
Attempted to contact patient to see if she was coming for her [place] appointment today before procedure on 2/15, had to leave a message

## 2019-03-27 ENCOUNTER — Encounter (HOSPITAL_BASED_OUTPATIENT_CLINIC_OR_DEPARTMENT_OTHER): Payer: Medicare PPO | Admitting: Internal Medicine

## 2019-03-28 ENCOUNTER — Other Ambulatory Visit (HOSPITAL_COMMUNITY): Payer: Medicare PPO

## 2019-04-01 ENCOUNTER — Other Ambulatory Visit (HOSPITAL_COMMUNITY): Payer: Medicare PPO

## 2019-04-03 ENCOUNTER — Encounter (HOSPITAL_BASED_OUTPATIENT_CLINIC_OR_DEPARTMENT_OTHER): Payer: Medicare PPO | Admitting: Internal Medicine

## 2019-04-09 ENCOUNTER — Ambulatory Visit: Payer: Medicare PPO | Attending: Internal Medicine

## 2019-04-09 DIAGNOSIS — Z23 Encounter for immunization: Secondary | ICD-10-CM

## 2019-04-09 NOTE — Progress Notes (Signed)
   Covid-19 Vaccination Clinic  Name:  [name]    MRN: [id number] DOB: 1940/06/29  04/09/2019  [name] was observed post Covid-19 immunization for 15 minutes without incidence. She was provided with Vaccine Information Sheet and instruction to access the V-Safe system.   [name] was instructed to call 911 with any severe reactions post vaccine: Marland Kitchen Difficulty breathing  . Swelling of your face and throat  . A fast heartbeat  . A bad rash all over your body  . Dizziness and weakness    Immunizations Administered    Name Date Dose VIS Date Route   Pfizer COVID-19 Vaccine 04/09/2019  9:15 AM 0.3 mL 01/20/2019 Intramuscular   Manufacturer: [employer]   Lot: [id number]   [place]: [phone]

## 2019-04-11 ENCOUNTER — Ambulatory Visit (HOSPITAL_COMMUNITY): Payer: Medicare PPO | Attending: Internal Medicine

## 2019-04-11 ENCOUNTER — Other Ambulatory Visit: Payer: Self-pay

## 2019-04-11 DIAGNOSIS — I129 Hypertensive chronic kidney disease with stage 1 through stage 4 chronic kidney disease, or unspecified chronic kidney disease: Secondary | ICD-10-CM | POA: Diagnosis not present

## 2019-04-11 DIAGNOSIS — E1122 Type 2 diabetes mellitus with diabetic chronic kidney disease: Secondary | ICD-10-CM | POA: Diagnosis not present

## 2019-04-11 DIAGNOSIS — J9611 Chronic respiratory failure with hypoxia: Secondary | ICD-10-CM | POA: Insufficient documentation

## 2019-04-11 DIAGNOSIS — I10 Essential (primary) hypertension: Secondary | ICD-10-CM | POA: Diagnosis not present

## 2019-04-11 DIAGNOSIS — N189 Chronic kidney disease, unspecified: Secondary | ICD-10-CM | POA: Insufficient documentation

## 2019-04-11 DIAGNOSIS — Z86711 Personal history of pulmonary embolism: Secondary | ICD-10-CM | POA: Diagnosis present

## 2019-04-11 NOTE — Progress Notes (Signed)
Please let [name] know that her follow up echo looks good. Thanks!

## 2019-05-09 ENCOUNTER — Ambulatory Visit: Payer: Medicare PPO | Attending: Internal Medicine

## 2019-05-09 ENCOUNTER — Other Ambulatory Visit: Payer: Self-pay | Admitting: Internal Medicine

## 2019-05-09 DIAGNOSIS — Z23 Encounter for immunization: Secondary | ICD-10-CM

## 2019-05-09 NOTE — Progress Notes (Signed)
   Covid-19 Vaccination Clinic  Name:  [name]    MRN: [id number] DOB: [birth year]  05/09/2019  [name] was observed post Covid-19 immunization for 15 minutes without incident. She was provided with Vaccine Information Sheet and instruction to access the V-Safe system.   [name] was instructed to call 911 with any severe reactions post vaccine: Marland Kitchen Difficulty breathing  . Swelling of face and throat  . A fast heartbeat  . A bad rash all over body  . Dizziness and weakness   Immunizations Administered    Name Date Dose VIS Date Route   Pfizer COVID-19 Vaccine 05/09/2019 11:56 AM 0.3 mL 01/20/2019 Intramuscular   Manufacturer: [place]   Lot: [id number]   [place]: [id number]

## 2019-05-12 ENCOUNTER — Inpatient Hospital Stay (HOSPITAL_COMMUNITY): Admission: RE | Admit: 2019-05-12 | Payer: Medicare PPO | Source: Ambulatory Visit

## 2019-05-16 ENCOUNTER — Ambulatory Visit: Payer: Medicare PPO | Admitting: Critical Care Medicine

## 2019-05-30 ENCOUNTER — Ambulatory Visit: Payer: Medicare PPO | Admitting: Critical Care Medicine

## 2019-06-10 ENCOUNTER — Other Ambulatory Visit (HOSPITAL_COMMUNITY)
Admission: RE | Admit: 2019-06-10 | Discharge: 2019-06-10 | Disposition: A | Discharge status: Home / Self Care | Payer: Medicare PPO | Source: Ambulatory Visit | Attending: Critical Care Medicine | Admitting: Critical Care Medicine

## 2019-06-10 DIAGNOSIS — Z20822 Contact with and (suspected) exposure to covid-19: Secondary | ICD-10-CM | POA: Insufficient documentation

## 2019-06-10 DIAGNOSIS — Z01812 Encounter for preprocedural laboratory examination: Secondary | ICD-10-CM | POA: Diagnosis present

## 2019-06-10 LAB — SARS CORONAVIRUS 2 (TAT 6-24 HRS): SARS Coronavirus 2: NEGATIVE

## 2019-06-11 ENCOUNTER — Other Ambulatory Visit: Payer: Self-pay | Admitting: Internal Medicine

## 2019-06-13 ENCOUNTER — Ambulatory Visit (INDEPENDENT_AMBULATORY_CARE_PROVIDER_SITE_OTHER): Payer: Medicare PPO | Admitting: Critical Care Medicine

## 2019-06-13 ENCOUNTER — Encounter: Payer: Self-pay | Admitting: Critical Care Medicine

## 2019-06-13 ENCOUNTER — Other Ambulatory Visit: Payer: Self-pay

## 2019-06-13 ENCOUNTER — Ambulatory Visit: Payer: Medicare PPO | Admitting: Critical Care Medicine

## 2019-06-13 VITALS — BP 138/82 | HR 92 | Temp 98.1°F | Ht 66.0 in | Wt 215.0 lb

## 2019-06-13 DIAGNOSIS — J432 Centrilobular emphysema: Secondary | ICD-10-CM

## 2019-06-13 DIAGNOSIS — J9611 Chronic respiratory failure with hypoxia: Secondary | ICD-10-CM

## 2019-06-13 DIAGNOSIS — D6859 Other primary thrombophilia: Secondary | ICD-10-CM

## 2019-06-13 DIAGNOSIS — Z86711 Personal history of pulmonary embolism: Secondary | ICD-10-CM | POA: Diagnosis not present

## 2019-06-13 DIAGNOSIS — D6869 Other thrombophilia: Secondary | ICD-10-CM

## 2019-06-13 LAB — PULMONARY FUNCTION TEST
DL/VA % pred: 85 %
DL/VA: 3.43 ml/min/mmHg/L
DLCO cor % pred: 55 %
DLCO cor: 11.14 ml/min/mmHg
DLCO unc % pred: 55 %
DLCO unc: 11.14 ml/min/mmHg
FEF 25-75 Post: 0.94 L/sec
FEF 25-75 Pre: 1.42 L/sec
FEF2575-%Change-Post: -33 %
FEF2575-%Pred-Post: 59 %
FEF2575-%Pred-Pre: 89 %
FEV1-%Change-Post: -8 %
FEV1-%Pred-Post: 61 %
FEV1-%Pred-Pre: 66 %
FEV1-Post: 1.33 L
FEV1-Pre: 1.45 L
FEV1FVC-%Change-Post: 5 %
FEV1FVC-%Pred-Pre: 109 %
FEV6-%Change-Post: -12 %
FEV6-%Pred-Post: 56 %
FEV6-%Pred-Pre: 64 %
FEV6-Post: 1.56 L
FEV6-Pre: 1.78 L
FEV6FVC-%Change-Post: 0 %
FEV6FVC-%Pred-Post: 105 %
FEV6FVC-%Pred-Pre: 105 %
FVC-%Change-Post: -12 %
FVC-%Pred-Post: 53 %
FVC-%Pred-Pre: 61 %
FVC-Post: 1.56 L
FVC-Pre: 1.78 L
Post FEV1/FVC ratio: 85 %
Post FEV6/FVC ratio: 100 %
Pre FEV1/FVC ratio: 81 %
Pre FEV6/FVC Ratio: 100 %
RV % pred: 90 %
RV: 2.24 L
TLC % pred: 71 %
TLC: 3.85 L

## 2019-06-13 NOTE — Progress Notes (Signed)
PFT done today. 

## 2019-06-13 NOTE — Progress Notes (Signed)
Synopsis: Referred in February 2021 for PE by Jonathon Resides, MD.  Subjective:   PATIENT ID: [name] GENDER: female DOB: 1940-05-01, MRN: [id number]  Chief Complaint  Patient presents with  . Follow-up    pt states physical activity causes sob. pt hasn't been using rescue inhaler just sits still 02 comes back up has 02 on standby if needed . states 02 doesnt;t go below 28    [name] is a 79 year old woman with a history of nephrotic syndrome, provoked submassive PE presents for follow-up.  [name] continues on Eliquis and has not had any bleeding issues other than minor bruising.  [name] no longer uses her oxygen during the day.  [name] still has dyspnea on exertion, but when [name] stops to rest her saturations have never dropped below 89%.  [name] was using her oxygen at night, but is noticed that it makes a high-pitched noise and stopped using it.  [name] has been working on losing weight and has lost 10 pounds in the last few months.  [name] wants to get back to doing water aerobics.  [name] denies wheezing.  [name] uses albuterol infrequently but does not feel that it significantly helps.  [name] continues on [place], [place].  [name] quit smoking about 40 years ago.  [name] was prescribed a split-night sleep study, but [name] has not scheduled this due to feeling that [name] has sleep apnea due to feeling that [name] sleeps well and wakes up well- rested.    OV 03/16/19: [name] is a 79 year old woman who presents with her daughter for evaluation after hospitalization for a submassive provoked pulmonary embolus from 12/28-1/4.  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  [name] received catheter directed thrombolytics while hospitalized and was discharged on Eliquis and has not had bleeding or significant bruising issues.  [name] was discharged on 2 L supplemental oxygen, which [name] has continued  using.  [name] has tried taking her oxygen off at home, now most of the time saturates 90% or above with rest, but has dropped as low as 85% when walking.  [name] has an oxygen saturation log with her.  [name] has shortness of breath, which is slowly improved some prior to admission.  [name] has shortness of breath that began in August prior to admission.  [name] has been using pursed lip breathing to help with her shortness of breath.  No dizziness, lightheadedness, chest pain, syncope.  [name] previously was diagnosed with exercise-induced asthma, and has been on Wixela, tiotropium, and Singulair prior to hospitalization.  [name] is never had PFTs in the past.  [name] smoked 1 to 1.5 packs/day for 20 years before quitting 38 years ago.  [name] is up-to-date on her vaccines; on the [facility] vaccine wait list for Covid vaccination.   Her daughter is concerned for OSA as [name] has seen her mother with witnessed apnea episodes when sleeping.    Past Medical History:  Diagnosis Date  . Actinic keratosis   . Arthritis   . Asthma   . Basal cell carcinoma   . COPD (chronic obstructive pulmonary disease) ([place])   . GERD (gastroesophageal reflux disease)   . Heart murmur    hx of in childhood   . Hyperlipidemia   . Hypertension   . Hypothyroidism   . MCNS (minimal change nephrotic syndrome)   . Osteopenia   . Shortness of breath dyspnea  on exertion   . Squamous cell carcinoma      Family History  Problem Relation Age of Onset  . Lymphoma Father   . Heart disease Paternal Grandfather   . COPD Mother        Husband Smoked   . Stroke Maternal Grandfather   . Rheumatic fever Paternal Grandmother   . Hypertension Sister   . Scoliosis Daughter      Past Surgical History:  Procedure Laterality Date  . ABDOMINAL HYSTERECTOMY    . APPENDECTOMY    . BILATERAL SALPINGOOPHORECTOMY     Ovarian Cysts   . 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 IR ANGIOGRAM PULMONARY BILATERAL SELECTIVE  02/08/2019  . IR ANGIOGRAM SELECTIVE EACH ADDITIONAL VESSEL  02/08/2019  . IR ANGIOGRAM SELECTIVE EACH ADDITIONAL VESSEL  02/08/2019  . IR INFUSION THROMBOL ARTERIAL INITIAL (MS)  02/08/2019  . IR INFUSION THROMBOL ARTERIAL INITIAL (MS)  02/08/2019  . IR THROMB F/U EVAL ART/VEN FINAL DAY (MS)  02/09/2019  . IR US GUIDE VASC ACCESS RIGHT  02/08/2019  . MEDIAL PARTIAL KNEE REPLACEMENT Bilateral   . MOHS SURGERY     x 2  . RENAL BIOPSY     x 2  . ROTATOR CUFF REPAIR Left     Social History   Socioeconomic History  . Marital status: Widowed    Spouse name: Not on file  . Number of children: 2  . Years of education: 54  . Highest education level: Not on file  Occupational History  . Occupation: TEACHER     Employer: [employer]    Comment: RETIRED   Tobacco Use  . Smoking status: Former Smoker    Packs/day: 1.50    Years: 20.00    Pack years: 30.00    Types: Cigarettes    Quit date: 01/12/1973    Years since quitting: 46.4  . Smokeless tobacco: Never Used  Substance and Sexual Activity  . Alcohol use: Yes    Alcohol/week: 0.5 standard drinks    Types: 1 Standard drinks or equivalent per week    Comment: [name] occasionally drinks a glass of wine.    . Drug use: No  . Sexual activity: Not Currently    Partners: Male  Other Topics Concern  . Not on file  Social History Narrative   Marital Status:  Widowed      Siblings: Kitty Simmons/ Fernanda Drum   Children:  2;  Grandchildren:  5    Pets: None   Living Situation: Lives alone   Occupation: Retired Pharmacist, hospital   Education: Forensic psychologist    Tobacco Use/Exposure:  [name] quit smoking > 20 years ago after having smoked 1 1/2 ppd for over 20 years.     Alcohol Use:  Occasional (Wine)    Drug Use:  None   Diet:  Regular   Exercise:  Water Aerobic Class 3 days a week and Weights   Hobbies: Reading/  Crafts/ Biking/ Reading]   Social Determinants of Health   Financial Resource Strain:   . Difficulty of Paying Living Expenses:   Food Insecurity:   . Worried About Charity fundraiser in the Last Year:   . Arboriculturist in the Last Year:   Transportation Needs:   . Film/video editor (Medical):   Marland Kitchen Lack of  Transportation (Non-Medical):   Physical Activity:   . Days of Exercise per Week:   . Minutes of Exercise per Session:   Stress:   . Feeling of Stress :   Social Connections:   . Frequency of Communication with Friends and Family:   . Frequency of Social Gatherings with Friends and Family:   . Attends Religious Services:   . Active Member of Clubs or Organizations:   . Attends Archivist Meetings:   Marland Kitchen Marital Status:   Intimate Partner Violence:   . Fear of Current or Ex-Partner:   . Emotionally Abused:   Marland Kitchen Physically Abused:   . Sexually Abused:      No Known Allergies   Immunization History  Administered Date(s) Administered  . Fluad Quad(high Dose 65+) 09/13/2018  . Influenza,inj,Quad PF,6+ Mos 10/09/2016, 10/27/2017  . Influenza-Unspecified 11/09/2013, 10/08/2014, 11/10/2015, 10/09/2016  . PFIZER SARS-COV-2 Vaccination 04/09/2019, 05/09/2019  . Pneumococcal Conjugate-13 12/26/2014, 04/21/2017  . Pneumococcal Polysaccharide-23 02/10/2011  . Tdap 12/09/2010  . Zoster 02/16/2008  . Zoster Recombinat (Shingrix) 04/21/2017, 07/02/2017    Outpatient Medications Prior to Visit  Medication Sig Dispense Refill  . acetaminophen (TYLENOL) 500 MG tablet Take 2 tablets (1,000 mg total) by mouth every 6 (six) hours as needed for mild pain.    Marland Kitchen albuterol (VENTOLIN HFA) 108 (90 Base) MCG/ACT inhaler Inhale 2 puffs into the lungs every 6 (six) hours as needed for wheezing.    Marland Kitchen apixaban (ELIQUIS) 5 MG TABS tablet Take 1 tablet (5 mg total) by mouth 2 (two) times daily. 60 tablet 11  . Calcium Carb-Cholecalciferol (CALCIUM-VITAMIN D) 600-400 MG-UNIT TABS Take  1 tablet by mouth daily.     . cholecalciferol (VITAMIN D) 1000 units tablet Take 1,000 Units by mouth 2 (two) times daily.    . citalopram (CELEXA) 20 MG tablet Take 20 mg by mouth at bedtime.   3  . diphenhydrAMINE (BENADRYL) 25 MG tablet Take 25 mg by mouth at bedtime as needed for sleep.     Marland Kitchen levothyroxine (SYNTHROID, LEVOTHROID) 88 MCG tablet Take 88 mcg by mouth every morning.  3  . losartan (COZAAR) 100 MG tablet Take 100 mg by mouth daily.    . metFORMIN (GLUCOPHAGE) 500 MG tablet Take 1 tablet (500 mg total) by mouth daily with breakfast. 30 tablet 3  . montelukast (SINGULAIR) 10 MG tablet Take 10 mg by mouth at bedtime.   3  . Multiple Vitamin (MULTIVITAMIN WITH MINERALS) TABS tablet Take 1 tablet by mouth 2 (two) times daily.    Marland Kitchen omeprazole (PRILOSEC) 40 MG capsule Take 40 mg by mouth 2 (two) times daily.    Marland Kitchen rOPINIRole (REQUIP) 1 MG tablet Take 1 mg by mouth at bedtime.    . rosuvastatin (CRESTOR) 40 MG tablet Take 20 mg by mouth daily. MWF    . tacrolimus (PROGRAF) 1 MG capsule Take 1 mg by mouth 2 (two) times daily.     . Tiotropium Bromide Monohydrate (SPIRIVA RESPIMAT) 2.5 MCG/ACT AERS Inhale 2 puffs into the lungs daily. Rinse mouth after using the inhaler    . WIXELA INHUB 100-50 MCG/DOSE AEPB Inhale 1 puff into the lungs 2 (two) times daily.     No facility-administered medications prior to visit.    Review of Systems  Respiratory: Positive for shortness of breath. Negative for cough and wheezing.   Cardiovascular: Positive for leg swelling. Negative for chest pain.  Gastrointestinal: Negative for blood in stool.  Genitourinary: Negative for hematuria.  Neurological: Positive for tremors. Negative for dizziness.  Endo/Heme/Allergies: Does not bruise/bleed easily.     Objective:   Vitals:   06/13/19 1329  BP: 138/82  Pulse: 92  Temp: 98.1 F (36.7 C)  TempSrc: Temporal  SpO2: 96%  Weight: 215 lb (97.5 kg)  Height: 5\' 6"  (1.676 m)   96% on RA BMI  Readings from Last 3 Encounters:  06/13/19 34.70 kg/m  03/16/19 36.38 kg/m  02/07/19 32.28 kg/m   Wt Readings from Last 3 Encounters:  06/13/19 215 lb (97.5 kg)  03/16/19 225 lb 6.4 oz (102.2 kg)  02/07/19 200 lb (90.7 kg)    Physical Exam Vitals reviewed.  Constitutional:      Appearance: Normal appearance. [name] is obese. [name] is not ill-appearing.  HENT:     Head: Normocephalic and atraumatic.  Eyes:     General: No scleral icterus. Cardiovascular:     Rate and Rhythm: Normal rate and regular rhythm.  Pulmonary:     Comments: Breathing comfortably on room air, no conversational dyspnea.  Clear to auscultation bilaterally. Abdominal:     General: There is no distension.  Musculoskeletal:        General: No deformity.     Cervical back: Neck supple.     Comments: LLE edema around previously broken ankle. No RLE edema.  Lymphadenopathy:     Cervical: No cervical adenopathy.  Skin:    General: Skin is warm and dry.     Findings: No rash.  Neurological:     General: No focal deficit present.     Mental Status: [name] is alert.     Coordination: Coordination normal.  Psychiatric:        Mood and Affect: Mood normal.        Behavior: Behavior normal.      CBC    Component Value Date/Time   WBC 6.9 02/12/2019 0501   RBC 3.40 (L) 02/12/2019 0501   HGB 9.1 (L) 02/12/2019 0501   HCT 30.2 (L) 02/12/2019 0501   PLT 235 02/12/2019 0501   MCV 88.8 02/12/2019 0501   MCH 26.8 02/12/2019 0501   MCHC 30.1 02/12/2019 0501   RDW 15.7 (H) 02/12/2019 0501   LYMPHSABS 1.4 02/06/2019 2302   MONOABS 1.3 (H) 02/06/2019 2302   EOSABS 0.1 02/06/2019 2302   BASOSABS 0.1 02/06/2019 2302    CHEMISTRY No results for input(s): NA, K, CL, CO2, GLUCOSE, BUN, CREATININE, CALCIUM, MG, PHOS in the last 168 hours. CrCl cannot be calculated (Patient's most recent lab result is older than the maximum 21 days allowed.).   Chest Imaging- films reviewed: CTA chest 02/07/2019- centrilobular  emphysema in the upper lobes.  RML scarring with 8 mm nodular opacity.  Peripheral scarring in the inferior lingula, LLL.  Bilateral pulmonary emboli tube multiple subsegmental levels.  Reflux of contrast on IVC into liver.  Hiatal hernia.  Pulmonary Functions Testing Results: PFT Results Latest Ref Rng & Units 06/13/2019  FVC-Pre L 1.78  FVC-Predicted Pre % 61  FVC-Post L 1.56  FVC-Predicted Post % 53  Pre FEV1/FVC % % 81  Post FEV1/FCV % % 85  FEV1-Pre L 1.45  FEV1-Predicted Pre % 66  FEV1-Post L 1.33  DLCO UNC% % 55  DLCO COR %Predicted % 85  TLC L 3.85  TLC % Predicted % 71  RV % Predicted % 90   2021- No significant obstruction, although significant decrease in FVC postbronchodilator. Mild restriction with severely reduced ERV.  Moderate diffusion impairment.  Echocardiogram 02/08/2019: LVEF 60 to 65%, borderline LVH with grade 1 diastolic dysfunction.  Normal LA, dilated RV with reduced systolic function and volume and pressure overload.  PASP 60.8 mmHg mild to moderately dilated RA.  Trivial pericardial effusion.  Dilated IVC with normal variability.  Echocardiogram 04/11/2019: LVEF 65 to 70%, mild LVH, grade 1 diastolic dysfunction.  Normal RV.  Pulmonary arteriogram 02/09/2019: Acquired pressure measurements as follows:  Preprocedural main pulmonary artery 5 7/34; mean  43 (normal: < 25/10)  Postprocedural main pulmonary artery  27/20; mean  24 (note, postprocedural pulmonary arterial pressure measurement may be slightly inaccurate given postprocedural measurements acquired with patient seated upright given excessive bleeding from the right jugular access site).    Assessment & Plan:     ICD-10-CM   1. History of pulmonary embolus (PE)  Z86.711 Pulse oximetry, overnight    CANCELED: Pulse oximetry, overnight  2. Acquired hypercoagulable state ([place])  D68.59   3. Chronic respiratory failure with hypoxia (HCC)  J96.11 Pulse oximetry, overnight    Ambulatory Referral for  DME    CANCELED: Pulse oximetry, overnight  4. Centrilobular emphysema (HCC)  J43.2     Chronic hypoxic respiratory failure due to pulmonary embolus with acute cor pulmonale. -Overnight oximetry on room air to assess if [name] can discontinue oxygen.  Okay to hold polysomnogram pending these results. -Recommend [name] let her respiratory company know that her home concentrator is malfunctioning.  I would recommend using this until overnight oximetry.  Provoked submassive PE, unfortunately nephrotic syndrome is a no modifiable risk factor raising her future risk for recurrent VTE. -Recommend lifelong anticoagulation at least annual assessment of bleeding risk--seems low currently.  Needs at least 6 months uninterrupted (no June 2021).   -We have previously discussed the risk of spontaneous bleeding and bleeding complications related to [facility].  [name] should seek medical care for any significant bleeding or head injuries in the future. -Reviewed echocardiogram results with her-normalization of RV function -Continue supplemental oxygen at night until ONO completed  Asthma-well-controlled on inhalers.  Emphysema on CT, but no obstruction on spirometry.  Restriction is likely due to body habitus.  Reduced diffusion potentially emphysema, anemia, and history of PE. -Continue current inhalers-Spiriva and Wixela -Albuterol as needed -Up-to-date on Covid, flu, and pneumonia vaccines.  Pulmonary nodule, history of tobacco abuse -Follow-up CT June 2021 as previously ordered  Witnessed apneas, concern for OSA.   -Overnight oximetry; if [name] has persistent desaturations can determine if PSG is warranted versus repeat ONO on 2 L.    RTC in 6 months.   Current Outpatient Medications:  .  acetaminophen (TYLENOL) 500 MG tablet, Take 2 tablets (1,000 mg total) by mouth every 6 (six) hours as needed for mild pain., Disp: , Rfl:  .  albuterol (VENTOLIN HFA) 108 (90 Base) MCG/ACT inhaler, Inhale 2 puffs into the  lungs every 6 (six) hours as needed for wheezing., Disp: , Rfl:  .  apixaban (ELIQUIS) 5 MG TABS tablet, Take 1 tablet (5 mg total) by mouth 2 (two) times daily., Disp: 60 tablet, Rfl: 11 .  Calcium Carb-Cholecalciferol (CALCIUM-VITAMIN D) 600-400 MG-UNIT TABS, Take 1 tablet by mouth daily. , Disp: , Rfl:  .  cholecalciferol (VITAMIN D) 1000 units tablet, Take 1,000 Units by mouth 2 (two) times daily., Disp: , Rfl:  .  citalopram (CELEXA) 20 MG tablet, Take 20 mg by mouth at bedtime. , Disp: , Rfl: 3 .  diphenhydrAMINE (BENADRYL) 25 MG tablet, Take 25 mg by mouth at  bedtime as needed for sleep. , Disp: , Rfl:  .  levothyroxine (SYNTHROID, LEVOTHROID) 88 MCG tablet, Take 88 mcg by mouth every morning., Disp: , Rfl: 3 .  losartan (COZAAR) 100 MG tablet, Take 100 mg by mouth daily., Disp: , Rfl:  .  metFORMIN (GLUCOPHAGE) 500 MG tablet, Take 1 tablet (500 mg total) by mouth daily with breakfast., Disp: 30 tablet, Rfl: 3 .  montelukast (SINGULAIR) 10 MG tablet, Take 10 mg by mouth at bedtime. , Disp: , Rfl: 3 .  Multiple Vitamin (MULTIVITAMIN WITH MINERALS) TABS tablet, Take 1 tablet by mouth 2 (two) times daily., Disp: , Rfl:  .  omeprazole (PRILOSEC) 40 MG capsule, Take 40 mg by mouth 2 (two) times daily., Disp: , Rfl:  .  rOPINIRole (REQUIP) 1 MG tablet, Take 1 mg by mouth at bedtime., Disp: , Rfl:  .  rosuvastatin (CRESTOR) 40 MG tablet, Take 20 mg by mouth daily. MWF, Disp: , Rfl:  .  tacrolimus (PROGRAF) 1 MG capsule, Take 1 mg by mouth 2 (two) times daily. , Disp: , Rfl:  .  Tiotropium Bromide Monohydrate (SPIRIVA RESPIMAT) 2.5 MCG/ACT AERS, Inhale 2 puffs into the lungs daily. Rinse mouth after using the inhaler, Disp: , Rfl:  .  WIXELA INHUB 100-50 MCG/DOSE AEPB, Inhale 1 puff into the lungs 2 (two) times daily., Disp: , Rfl:      Julian Hy, DO [facility] 06/13/2019 6:28 PM

## 2019-06-13 NOTE — Patient Instructions (Addendum)
Thank you for visiting Dr. Carlis Abbott at [facility]. We recommend the following: Orders Placed This Encounter  Procedures  . Pulse oximetry, overnight   Orders Placed This Encounter  Procedures  . Pulse oximetry, overnight    Standing Status:   Future    Standing Expiration Date:   06/12/2020    No orders of the defined types were placed in this encounter.   Return in about 6 months (around 12/14/2019).    Please do your part to reduce the spread of COVID-19.

## 2019-07-03 ENCOUNTER — Other Ambulatory Visit: Payer: Self-pay | Admitting: Family Medicine

## 2019-07-03 DIAGNOSIS — Z1231 Encounter for screening mammogram for malignant neoplasm of breast: Secondary | ICD-10-CM

## 2019-07-05 ENCOUNTER — Telehealth: Payer: Self-pay

## 2019-07-05 NOTE — Telephone Encounter (Signed)
I sent a high priority message to [employer] to get the status of the ONO order placed on 06/13/19.  Pt states she never received anything to complete the ONO.

## 2019-07-07 NOTE — Telephone Encounter (Signed)
I have requested another update on this one.

## 2019-07-07 NOTE — Telephone Encounter (Signed)
Miquel Dunn sent to Benny Lennert Wynelle [place]; Skeet Latch; 1 other  ONO scheduled for 07/11/19 today 07/07/19 at 1115am with patient. Thank you!

## 2019-07-07 NOTE — Telephone Encounter (Signed)
Holly, please advise if you have any update on this.

## 2019-07-11 ENCOUNTER — Encounter: Payer: Self-pay | Admitting: Critical Care Medicine

## 2019-07-14 ENCOUNTER — Ambulatory Visit (INDEPENDENT_AMBULATORY_CARE_PROVIDER_SITE_OTHER)
Admission: RE | Admit: 2019-07-14 | Discharge: 2019-07-14 | Disposition: A | Discharge status: Home / Self Care | Payer: Medicare PPO | Source: Ambulatory Visit | Attending: Critical Care Medicine | Admitting: Critical Care Medicine

## 2019-07-14 ENCOUNTER — Other Ambulatory Visit: Payer: Self-pay

## 2019-07-14 DIAGNOSIS — R911 Solitary pulmonary nodule: Secondary | ICD-10-CM | POA: Diagnosis not present

## 2019-07-14 DIAGNOSIS — J9611 Chronic respiratory failure with hypoxia: Secondary | ICD-10-CM

## 2019-07-19 ENCOUNTER — Telehealth: Payer: Self-pay | Admitting: Critical Care Medicine

## 2019-07-19 DIAGNOSIS — J9611 Chronic respiratory failure with hypoxia: Secondary | ICD-10-CM

## 2019-07-19 NOTE — Telephone Encounter (Signed)
ONO 07/11/19 on RA:  3- hours 17 minutes 24 seconds spent (43.5% of the test) spent less than 88% with low saturation 60%. She still needs O2 to sleep.  Split night sleep study ordered based on concern for OSA with witnessed apneas.  Julian Hy, DO 07/19/19 2:39 PM [facility]

## 2019-07-19 NOTE — Addendum Note (Signed)
Addended by: Lia Foyer R on: 07/19/2019 03:48 PM   Modules accepted: Orders

## 2019-07-19 NOTE — Progress Notes (Signed)
Please let [name] know that her CT scan shows that the nodule in her lung we were watching has shrunk, so it is unlikely to be anything severe. We don't need to follow it anymore. Thanks! LPC

## 2019-07-19 NOTE — Telephone Encounter (Signed)
Called and spoke with patient letting her know that we got ONO back and that she does need O2 at night when she sleeps. Asked patient if she would like to use [employer] again and she said that's fine.

## 2019-08-03 ENCOUNTER — Other Ambulatory Visit (HOSPITAL_COMMUNITY)
Admission: RE | Admit: 2019-08-03 | Discharge: 2019-08-03 | Disposition: A | Discharge status: Home / Self Care | Payer: Medicare PPO | Source: Ambulatory Visit | Attending: Pulmonary Disease | Admitting: Pulmonary Disease

## 2019-08-03 DIAGNOSIS — Z20822 Contact with and (suspected) exposure to covid-19: Secondary | ICD-10-CM | POA: Diagnosis not present

## 2019-08-03 DIAGNOSIS — Z01812 Encounter for preprocedural laboratory examination: Secondary | ICD-10-CM | POA: Diagnosis present

## 2019-08-03 LAB — SARS CORONAVIRUS 2 (TAT 6-24 HRS): SARS Coronavirus 2: NEGATIVE

## 2019-08-05 ENCOUNTER — Ambulatory Visit (HOSPITAL_BASED_OUTPATIENT_CLINIC_OR_DEPARTMENT_OTHER): Payer: Medicare PPO | Attending: Critical Care Medicine | Admitting: Pulmonary Disease

## 2019-08-05 ENCOUNTER — Other Ambulatory Visit: Payer: Self-pay

## 2019-08-05 DIAGNOSIS — G4733 Obstructive sleep apnea (adult) (pediatric): Secondary | ICD-10-CM | POA: Insufficient documentation

## 2019-08-05 DIAGNOSIS — J9611 Chronic respiratory failure with hypoxia: Secondary | ICD-10-CM | POA: Insufficient documentation

## 2019-08-09 ENCOUNTER — Other Ambulatory Visit: Payer: Self-pay

## 2019-08-09 ENCOUNTER — Ambulatory Visit
Admission: RE | Admit: 2019-08-09 | Discharge: 2019-08-09 | Disposition: A | Discharge status: Home / Self Care | Payer: Medicare PPO | Source: Ambulatory Visit | Attending: Family Medicine | Admitting: Family Medicine

## 2019-08-09 DIAGNOSIS — Z1231 Encounter for screening mammogram for malignant neoplasm of breast: Secondary | ICD-10-CM

## 2019-08-10 DIAGNOSIS — G4733 Obstructive sleep apnea (adult) (pediatric): Secondary | ICD-10-CM

## 2019-08-10 NOTE — Procedures (Signed)
Patient Name: [name], [name] Date: 08/05/2019 Gender: Female D.O.B: 07-Nov-1940 Age (years): 78 Referring Provider: Noemi Chapel DO Height (inches): 43 Interpreting Physician: Kara Mead MD, ABSM Weight (lbs): 211 RPSGT: Earney Hamburg BMI: 34 MRN: [id number] Neck Size: 16.00 <br> <br> CLINICAL INFORMATION Sleep Study Type: NPSG    Indication for sleep study: N/A    Epworth Sleepiness Score: 4    SLEEP STUDY TECHNIQUE As per the AASM Manual for the Scoring of Sleep and Associated Events v2.3 (April 2016) with a hypopnea requiring 4% desaturations.  The channels recorded and monitored were frontal, central and occipital EEG, electrooculogram (EOG), submentalis EMG (chin), nasal and oral airflow, thoracic and abdominal wall motion, anterior tibialis EMG, snore microphone, electrocardiogram, and pulse oximetry.  MEDICATIONS Medications self-administered by patient taken the night of the study : N/A  SLEEP ARCHITECTURE The study was initiated at 9:41:58 PM and ended at 4:35:13 AM.  Sleep onset time was 37.4 minutes and the sleep efficiency was 61.2%%. The total sleep time was 252.9 minutes.  Stage REM latency was 296.0 minutes.  The patient spent 1.8%% of the night in stage N1 sleep, 97.8%% in stage N2 sleep, 0.0%% in stage N3 and 0.4% in REM.  Alpha intrusion was absent.  Supine sleep was 21.16%.  RESPIRATORY PARAMETERS The overall apnea/hypopnea index (AHI) was 26.3 per hour. There were 7 total apneas, including 6 obstructive, 1 central and 0 mixed apneas. There were 104 hypopneas and 59 RERAs.  The AHI during Stage REM sleep was 0.0 per hour.  AHI while supine was 75.1 per hour.  The mean oxygen saturation was 89.7%. The minimum SpO2 during sleep was 85.0%. She spent 58 mins with sat <88%  moderate snoring was noted during this study.  CARDIAC DATA The 2 lead EKG demonstrated sinus rhythm. The mean heart rate was 80.5 beats per minute. Other EKG  findings include: None. LEG MOVEMENT DATA The total PLMS were 0 with a resulting PLMS index of 0.0. Several LMs were observed .Associated arousal with leg movement index was 5.5 .  IMPRESSIONS - Moderate obstructive sleep apnea occurred during this study (AHI = 26.3/h). - No significant central sleep apnea occurred during this study (CAI = 0.2/h). - Mild oxygen desaturation was noted during this study (Min O2 = 85.0%). - The patient snored with moderate snoring volume. - No cardiac abnormalities were noted during this study. - Clinically significant periodic limb movements did not occur during sleep. Associated arousals were significant.   DIAGNOSIS - Obstructive Sleep Apnea (327.23 [G47.33 ICD-10]) - Significant Limb Movements During Sleep (327.51 [G47.61 ICD-10]) - Nocturnal Hypoxemia (327.26 [G47.36 ICD-10])   RECOMMENDATIONS - Therapeutic CPAP titration to determine optimal pressure required to alleviate sleep disordered breathing. Alternatively, autoCPAP can be tried with a full face mask - Positional therapy avoiding supine position during sleep. - Avoid alcohol, sedatives and other CNS depressants that may worsen sleep apnea and disrupt normal sleep architecture. - Sleep hygiene should be reviewed to assess factors that may improve sleep quality. - Weight management and regular exercise should be initiated or continued if appropriate.    Kara Mead MD Board Certified in [place]

## 2019-08-10 NOTE — Progress Notes (Signed)
Please let her know that she has moderate sleep apnea and this should be treated with a CPAP machine. I recommend having an autoPAP device with pressures 5-15 ordered with a full face mask. She should follow up with an APP in 1-2 weeks and a referral to sleep medicine to follow up with Dr. Elsworth Soho, Sharion Settler, or Young. Thanks! LPC

## 2019-08-15 NOTE — Progress Notes (Signed)
Please let Ms. Lofland know that nowhere in these recommendations is there anything about supplemental oxygen being equivalent to CPAP.  "RECOMMENDATIONS  - Therapeutic CPAP titration to determine optimal pressure  required to alleviate sleep disordered breathing. Alternatively,  autoCPAP can be tried with a full face mask  - Positional therapy avoiding supine position during sleep.  - Avoid alcohol, sedatives and other CNS depressants that may  worsen sleep apnea and disrupt normal sleep architecture.  - Sleep hygiene should be reviewed to assess factors that may  improve sleep quality.  - Weight management and regular exercise should be initiated or  continued if appropriate. "

## 2019-08-16 ENCOUNTER — Telehealth: Payer: Self-pay | Admitting: Critical Care Medicine

## 2019-08-16 ENCOUNTER — Other Ambulatory Visit: Payer: Self-pay | Admitting: Critical Care Medicine

## 2019-08-16 DIAGNOSIS — G4733 Obstructive sleep apnea (adult) (pediatric): Secondary | ICD-10-CM

## 2019-08-16 NOTE — Telephone Encounter (Signed)
Spoke with pt, I advised her that I didn't see where anyone called her. Pt understood and will call us back if she can retrieve a name from her VM.

## 2019-08-29 ENCOUNTER — Ambulatory Visit (INDEPENDENT_AMBULATORY_CARE_PROVIDER_SITE_OTHER): Payer: Medicare PPO | Admitting: Pulmonary Disease

## 2019-08-29 ENCOUNTER — Other Ambulatory Visit: Payer: Self-pay

## 2019-08-29 ENCOUNTER — Encounter: Payer: Self-pay | Admitting: Pulmonary Disease

## 2019-08-29 DIAGNOSIS — J449 Chronic obstructive pulmonary disease, unspecified: Secondary | ICD-10-CM | POA: Diagnosis not present

## 2019-08-29 DIAGNOSIS — G4733 Obstructive sleep apnea (adult) (pediatric): Secondary | ICD-10-CM

## 2019-08-29 DIAGNOSIS — R918 Other nonspecific abnormal finding of lung field: Secondary | ICD-10-CM

## 2019-08-29 HISTORY — DX: Obstructive sleep apnea (adult) (pediatric): G47.33

## 2019-08-29 NOTE — Assessment & Plan Note (Signed)
Reviewed most recent CT in June/2021 which showed resolution to pulmonary nodule  No further imaging follow-up needed at this time

## 2019-08-29 NOTE — Assessment & Plan Note (Signed)
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

## 2019-08-29 NOTE — Patient Instructions (Addendum)
You were seen today by Lauraine Rinne, NP  for:   It was nice talking with you today over the phone.  As we reviewed over the phone your split-night sleep study showed moderate obstructive sleep apnea.  We will start you on CPAP therapy.  Your most recent CT chest showed resolution of the pulmonary nodule we are following.  This is good news.  Continue your inhalers.  We will see you back in a follow-up in 8 to 10 weeks with a sleep MD or a APP to check on CPAP compliance.  We will repeat a overnight oximetry to check your oxygen levels when you were using your CPAP.  We will also have you placed in for recall with Dr. Carlis Abbott in 4 to 6 months for follow-up with her for pulmonary needs.  Take care and stay safe,   Torsha Lemus  1. Chronic obstructive pulmonary disease, unspecified COPD type (HCC)  Continue Wixela 100  Continue Spiriva Respimat 2.5 >>> 2 puffs daily >>> Do this every day >>>This is not a rescue inhaler  Note your daily symptoms > remember "red flags" for COPD:   >>>Increase in cough >>>increase in sputum production >>>increase in shortness of breath or activity  intolerance.   If you notice these symptoms, please call the office to be seen.    2. OSA (obstructive sleep apnea)  New CPAP start APAP setting 5-15 Mask of choice DME: [employer]  Once patient is established and using CPAP therapy will need to order overnight oximetry to assess oxygen needs at night  We recommend that you start using your CPAP daily >>>Keep up the hard work using your device >>> Goal should be wearing this for the entire night that you are sleeping, at least 4 to 6 hours  Remember:  . Do not drive or operate heavy machinery if tired or drowsy.  . Please notify the supply company and office if you are unable to use your device regularly due to missing supplies or machine being broken.  . Work on maintaining a healthy weight and following your recommended nutrition plan  . Maintain proper daily  exercise and movement  . Maintaining proper use of your device can also help improve management of other chronic illnesses such as: Blood pressure, blood sugars, and weight management.   BiPAP/ CPAP Cleaning:  >>>Clean weekly, with Dawn soap, and bottle brush.  Set up to air dry. >>> Wipe mask out daily with wet wipe or towelette    Follow Up:    Return in about 10 weeks (around 11/07/2019), or if symptoms worsen or fail to improve, for Sleep Consult - 31min slot.   Also schedule 4-6 month follow up with Dr. Carlis Abbott for follow up        Please do your part to reduce the spread of COVID-19:      Reduce your risk of any infection  and COVID19 by using the similar precautions used for avoiding the common cold or flu:  Marland Kitchen Wash your hands often with soap and warm water for at least 20 seconds.  If soap and water are not readily available, use an alcohol-based hand sanitizer with at least 60% alcohol.  . If coughing or sneezing, cover your mouth and nose by coughing or sneezing into the elbow areas of your shirt or coat, into a tissue or into your sleeve (not your hands). Langley Gauss A MASK when in public  . Avoid shaking hands with others and consider head nods or  verbal greetings only. . Avoid touching your eyes, nose, or mouth with unwashed hands.  . Avoid close contact with people who are sick. . Avoid places or events with large numbers of people in one location, like concerts or sporting events. . If you have some symptoms but not all symptoms, continue to monitor at home and seek medical attention if your symptoms worsen. . If you are having a medical emergency, call 911.   [place] / e-Visit: [email or web address]         [facility]: [facility]: [phone]                   [facility]: [phone]     It is flu season:    >>> Best ways to protect herself from the flu: Receive the yearly flu vaccine, practice good hand hygiene washing with soap and also using hand sanitizer when available, eat a nutritious meals, get adequate rest, hydrate appropriately   Please contact the office if your symptoms worsen or you have concerns that you are not improving.   Thank you for choosing [facility] for your healthcare, and for allowing [place] to partner with you on your healthcare journey. I am thankful to be able to provide care to you today.   Wyn Quaker FNP-C     Sleep Apnea Sleep apnea affects breathing during sleep. It causes breathing to stop for a short time or to become shallow. It can also increase the risk of:  Heart attack.  Stroke.  Being very overweight (obese).  Diabetes.  Heart failure.  Irregular heartbeat. The goal of treatment is to help you breathe normally again. What are the causes? There are three kinds of sleep apnea:  Obstructive sleep apnea. This is caused by a blocked or collapsed airway.  Central sleep apnea. This happens when the brain does not send the right signals to the muscles that control breathing.  Mixed sleep apnea. This is a combination of obstructive and central sleep apnea. The most common cause of this condition is a collapsed or blocked airway. This can happen if:  Your throat muscles are too relaxed.  Your tongue and tonsils are too large.  You are overweight.  Your airway is too small. What increases the risk?  Being overweight.  Smoking.  Having a small airway.  Being older.  Being female.  Drinking alcohol.  Taking medicines to calm yourself (sedatives or tranquilizers).  Having family members with the condition. What are the signs or symptoms?  Trouble staying asleep.  Being sleepy or tired during the day.  Getting angry a lot.  Loud snoring.  Headaches in the morning.  Not being able to focus your mind  (concentrate).  Forgetting things.  Less interest in sex.  Mood swings.  Personality changes.  Feelings of sadness (depression).  Waking up a lot during the night to pee (urinate).  Dry mouth.  Sore throat. How is this diagnosed?  Your medical history.  A physical exam.  A test that is done when you are sleeping (sleep study). The test is most often done in a sleep lab but may also be done at home. How is this treated?   Sleeping on your side.  Using a medicine to get rid of mucus in your nose (decongestant).  Avoiding the use of alcohol, medicines to help you relax, or certain pain medicines (narcotics).  Losing weight,  if needed.  Changing your diet.  Not smoking.  Using a machine to open your airway while you sleep, such as: ? An oral appliance. This is a mouthpiece that shifts your lower jaw forward. ? A CPAP device. This device blows air through a mask when you breathe out (exhale). ? An EPAP device. This has valves that you put in each nostril. ? A BPAP device. This device blows air through a mask when you breathe in (inhale) and breathe out.  Having surgery if other treatments do not work. It is important to get treatment for sleep apnea. Without treatment, it can lead to:  High blood pressure.  Coronary artery disease.  In men, not being able to have an erection (impotence).  Reduced thinking ability. Follow these instructions at home: Lifestyle  Make changes that your doctor recommends.  Eat a healthy diet.  Lose weight if needed.  Avoid alcohol, medicines to help you relax, and some pain medicines.  Do not use any products that contain nicotine or tobacco, such as cigarettes, e-cigarettes, and chewing tobacco. If you need help quitting, ask your doctor. General instructions  Take over-the-counter and prescription medicines only as told by your doctor.  If you were given a machine to use while you sleep, use it only as told by your  doctor.  If you are having surgery, make sure to tell your doctor you have sleep apnea. You may need to bring your device with you.  Keep all follow-up visits as told by your doctor. This is important. Contact a doctor if:  The machine that you were given to use during sleep bothers you or does not seem to be working.  You do not get better.  You get worse. Get help right away if:  Your chest hurts.  You have trouble breathing in enough air.  You have an uncomfortable feeling in your back, arms, or stomach.  You have trouble talking.  One side of your body feels weak.  A part of your face is hanging down. These symptoms may be an emergency. Do not wait to see if the symptoms will go away. Get medical help right away. Call your local emergency services (911 in the [place] Do not drive yourself to the hospital. Summary  This condition affects breathing during sleep.  The most common cause is a collapsed or blocked airway.  The goal of treatment is to help you breathe normally while you sleep. This information is not intended to replace advice given to you by your health care provider. Make sure you discuss any questions you have with your health care provider. Document Revised: 11/12/2017 Document Reviewed: 09/21/2017 Elsevier Patient Education  [place].

## 2019-08-29 NOTE — Progress Notes (Signed)
Virtual Visit via Telephone Note  I connected with [name] on 08/29/19 at  3:00 PM EDT by telephone and verified that I am speaking with the correct person using two identifiers.  Location: Patient: Home Provider: [facility] - [address], [place], [place], [place] [address]   I discussed the limitations, risks, security and privacy concerns of performing an evaluation and management service by telephone and the availability of in person appointments. I also discussed with the patient that there may be a patient responsible charge related to this service. The patient expressed understanding and agreed to proceed.  Patient consented to consult via telephone: Yes People present and their role in pt care: Pt     History of Present Illness:  79 year old female former smoker followed in our office for COPD  Past medical history: History of PE, anxiety, GERD, hypertension Smoking history: Former smoker.  Quit 1974.  30-pack-year smoking history Maintenance: Wixella 100 and Spiriva Resp 2.5 Patient of Dr. Carlis Abbott  Chief complaint:    79 year old female former smoker followed in our office for COPD.  Patient was last seen in our office in May/2021 by Dr. Carlis Abbott.  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  Continue Spiriva and Wixela.  Have a follow-up CT in June for follow-up of pulmonary nodules.  Patient reached today via telephone.  We will review her most recent CT as well as split-night sleep study.  Patient reports that she is doing okay from pulmonary standpoint.  She reports that adherence to her inhalers.  She occasionally gets shortness of breath with physical exertion.  Most recent split-night sleep study shows moderate obstructive sleep apnea.  Patient also had a recent CT in June/2021 that  showed that the right upper lobe pulmonary nodule we are following had resolved.  Observations/Objective:  08/05/2019-split-night sleep study-moderate effective sleep apnea with AHI of 26.3, mild oxygen desaturation of SaO2 low 85%  06/13/2019-pulmonary function test-FVC 1.78 (61% predicted), postbronchodilator ratio 85, postbronchodilator FEV1 1.33 (61% predicted), no bronchodilator response, DLCO 11.14 (55% predicted)  07/14/2019-CT chest without contrast-previous right upper lobe pulmonary nodule has resolved, there is minimal linear scarring site to the prior nodule suggesting this postinfectious or inflammatory, no new acute current pulmonary nodules, mild emphysema, small hiatal hernia  Social History   Tobacco Use  Smoking Status Former Smoker  . Packs/day: 1.50  . Years: 20.00  . Pack years: 30.00  . Types: Cigarettes  . Quit date: 01/12/1973  . Years since quitting: 46.6  Smokeless Tobacco Never Used   Immunization History  Administered Date(s) Administered  . Fluad Quad(high Dose 65+) 09/13/2018  . Influenza,inj,Quad PF,6+ Mos 10/09/2016, 10/27/2017  . Influenza-Unspecified 11/09/2013, 10/08/2014, 11/10/2015, 10/09/2016  . PFIZER SARS-COV-2 Vaccination 04/09/2019, 05/09/2019  . Pneumococcal Conjugate-13 12/26/2014, 04/21/2017  . Pneumococcal Polysaccharide-23 02/10/2011  . Tdap 12/09/2010  . Zoster 02/16/2008  . Zoster Recombinat (Shingrix) 04/21/2017, 07/02/2017      Assessment and Plan:  COPD (chronic obstructive pulmonary disease) (HCC) Plan: Continue Wixela Continue Spiriva Respimat 2.5 4 to 57-month follow-up with Dr. 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   4 to 36-month follow-up with Dr. Carlis Abbott  I discussed the assessment and treatment plan with the patient. The patient was provided an opportunity to ask questions and all were answered. The patient agreed with the plan and demonstrated an understanding of the instructions.   The patient was advised to call back or seek an in-person evaluation if the symptoms worsen or if the condition fails to improve as anticipated.  I provided 23 minutes of non-face-to-face time during this encounter.   Lauraine Rinne, NP

## 2019-08-29 NOTE — Assessment & Plan Note (Signed)
Plan: Continue Wixela Continue Spiriva Respimat 2.5 4 to 28-month follow-up with Dr. Carlis Abbott

## 2019-08-30 NOTE — Progress Notes (Signed)
Reviewed and agree with assessment/plan.   Chesley Mires, MD [facility] Pulmonary/Critical Care 08/30/2019, 8:36 AM Pager:  [phone]

## 2019-09-14 ENCOUNTER — Telehealth: Payer: Self-pay | Admitting: Pulmonary Disease

## 2019-09-15 NOTE — Telephone Encounter (Signed)
VM received from [place] states there was a hold up getting [place] required but that has been received and [employer] has been trying to reach pt but has been unsuccessful.  He is going to have them to reach out to her again today. I called pt to make her aware and she states they just contacted her and she has appt.  Nothing further needed.

## 2019-09-15 NOTE — Telephone Encounter (Signed)
Message sent to [employer] to see what has happened with this order Joellen [place]

## 2019-09-15 NOTE — Telephone Encounter (Signed)
I checked referrals and 2 orders sent to [employer] last month for cpap supplies and confirmation received from [place] on both.  Called [employer] & left Brad vm asking for status.

## 2019-10-31 LAB — CBC: RBC: 4.41 (ref 3.87–5.11)

## 2019-10-31 LAB — HEMOGLOBIN A1C: Hemoglobin A1C: 6.9

## 2019-10-31 LAB — CBC AND DIFFERENTIAL
HCT: 33 — AB (ref 36–46)
Hemoglobin: 10.2 — AB (ref 12.0–16.0)
Platelets: 308 (ref 150–399)
WBC: 6.4

## 2019-10-31 LAB — TSH: TSH: 1.73 (ref 0.41–5.90)

## 2019-10-31 LAB — VITAMIN D 25 HYDROXY (VIT D DEFICIENCY, FRACTURES): Vit D, 25-Hydroxy: 68

## 2019-11-06 ENCOUNTER — Other Ambulatory Visit: Payer: Self-pay

## 2019-11-06 ENCOUNTER — Ambulatory Visit (INDEPENDENT_AMBULATORY_CARE_PROVIDER_SITE_OTHER): Payer: Medicare PPO | Admitting: Pulmonary Disease

## 2019-11-06 ENCOUNTER — Encounter: Payer: Self-pay | Admitting: Pulmonary Disease

## 2019-11-06 VITALS — BP 124/78 | HR 106 | Temp 97.8°F | Ht 66.0 in | Wt 233.6 lb

## 2019-11-06 DIAGNOSIS — J4540 Moderate persistent asthma, uncomplicated: Secondary | ICD-10-CM | POA: Diagnosis not present

## 2019-11-06 DIAGNOSIS — J432 Centrilobular emphysema: Secondary | ICD-10-CM

## 2019-11-06 DIAGNOSIS — G4733 Obstructive sleep apnea (adult) (pediatric): Secondary | ICD-10-CM

## 2019-11-06 DIAGNOSIS — R0600 Dyspnea, unspecified: Secondary | ICD-10-CM

## 2019-11-06 DIAGNOSIS — J449 Chronic obstructive pulmonary disease, unspecified: Secondary | ICD-10-CM

## 2019-11-06 DIAGNOSIS — R0609 Other forms of dyspnea: Secondary | ICD-10-CM

## 2019-11-06 MED ORDER — TRELEGY ELLIPTA 100-62.5-25 MCG/INH IN AEPB: 1.0000 | INHALATION_SPRAY | Freq: Every day | RESPIRATORY_TRACT | 5 refills | Status: DC

## 2019-11-06 NOTE — Patient Instructions (Signed)
Stop using Wixela and Spiriva  Start using Trelegy one puff daily, and rinse your mouth after each use  Follow up in 6 months

## 2019-11-06 NOTE — Progress Notes (Signed)
[facility]  Chief Complaint  Patient presents with  . 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  Brief Summary:  [name] is a 79 y.o. female former smoker with obstructive sleep apnea, asthma, emphysema, pulmonary embolism, and dyspnea on exertion.      Subjective:   She had sleep study in June.  Moderate OSA.  Started on CPAP.  Has nasal cushion mask.  Sleeping better and feels more rested.  Not having sinus congestion, sore throat, dry mouth.  Has noticed trouble with her breathing since she had lung clot.  Hasn't been very active.  Does gets some cough and wheeze.  Albuterol helps, but she doesn't use often.  Labs 10/31/19: WBC 6.4, Hb 10.2, PLT 308, Eos 0.2, Na 138, K 4.4, CO2 28, Creatinine 1.05, Ca 9.2.  Physical Exam:   Appearance - well kempt   ENMT - no sinus tenderness, no oral exudate, no LAN, Mallampati 3 airway, no stridor  Respiratory - equal breath  sounds bilaterally, no wheezing or rales  CV - s1s2 regular rate and rhythm, no murmurs  Ext - no clubbing, no edema  Skin - no rashes  Psych - normal mood and affect   Pulmonary testing:   PFT 06/13/19 >> FEV1 1.45 (66%), FEV1% 81, TLC 3.85 (71%), DLCO 55%  Chest Imaging:   CT chest 07/14/19 >> mild emphysema, small hiatal hernia  Sleep Tests:   PSG 08/05/19 >> AHI 26.3, SpO2 low 85%  Auto CPAP 10/11/19 to 11/02/19 >> used on 23 of 23 nights with average 9 hrs 12 min.  Average AHI 3.5 with median CPAP 8 and 95 th percentile CPAP 10 cm H2O  Cardiac Tests:   Echo 04/11/19 >> EF 65 to 70%, mild LVH, grade 1 DD  Social History:  She  reports that she quit smoking about 46 years ago. Her smoking use included cigarettes. She has a 30.00 pack-year smoking history. She has never used smokeless tobacco. She reports current alcohol use of about 0.5 standard drinks of alcohol per week. She reports that she does not use drugs.  Family History:  Her family history includes COPD in her mother; Heart disease in her paternal grandfather; Hypertension in her sister; Lymphoma in her father; Rheumatic fever in her paternal grandmother; Scoliosis in her daughter; Stroke in her maternal grandfather.     Assessment/Plan:   Obstructive sleep apnea. 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 - indefinite therapy with eliquis  Asthma, Emphysema. - will have her try changing to trelegy to see if this improves her breathing symptoms - continue singulair, prn albuterol  Dyspnea on exertion. - explained how deconditioning can contribute to this - encouraged her to maintain a regular exercise regimen  Stage 3a CKD, Minimal Change Disease. - followed by Dr. Audie Clear with [facility]rology at [facility]  Time Spent Involved in Patient Care on Day of Examination:  34  minutes  Follow up:  Patient Instructions  Stop using Wixela and Spiriva  Start using Trelegy one puff daily, and rinse your mouth after each use  Follow up in 6 months   Medication List:   Allergies as of 11/06/2019   No Known Allergies     Medication List       Accurate as of November 06, 2019  2:38 PM. If you have any questions, ask your nurse or doctor.        STOP taking these medications   Spiriva Respimat 2.5 MCG/ACT Aers Generic drug: Tiotropium Bromide Monohydrate Stopped by: Chesley Mires, MD   Wixela Inhub 100-50 MCG/DOSE Aepb Generic drug: Fluticasone-Salmeterol Stopped by: Chesley Mires, MD     TAKE these medications   acetaminophen 500 MG tablet Commonly known as: TYLENOL Take 2 tablets (1,000 mg total) by mouth every 6 (six) hours as needed for mild pain.   albuterol 108 (90 Base) MCG/ACT inhaler Commonly known as: VENTOLIN HFA Inhale 2 puffs into the lungs every 6 (six) hours as needed for wheezing.   apixaban 5 MG Tabs tablet Commonly known as: Eliquis Take 1 tablet (5 mg total) by mouth 2 (two) times daily.   Calcium-Vitamin D 600-400 MG-UNIT Tabs Take 1 tablet by mouth daily.   cholecalciferol 1000 units tablet Commonly known as: VITAMIN D Take 1,000 Units by mouth 2 (two) times daily.   citalopram 20 MG tablet Commonly known as: CELEXA Take 20 mg by mouth at bedtime.   diphenhydrAMINE 25 MG tablet Commonly known as: BENADRYL Take 25 mg by mouth at bedtime as needed for sleep.   levothyroxine 88 MCG tablet Commonly known as: SYNTHROID Take 88 mcg by mouth every morning.   losartan 100 MG tablet Commonly known as: COZAAR Take 100 mg by mouth daily.   metFORMIN 500 MG tablet Commonly known as: GLUCOPHAGE Take 1 tablet (500 mg total) by mouth daily with breakfast.   montelukast 10 MG tablet Commonly known as: SINGULAIR Take 10 mg by mouth at bedtime.   multivitamin with minerals Tabs tablet Take 1 tablet by mouth 2 (two) times  daily.   omeprazole 40 MG capsule Commonly known as: PRILOSEC Take 40 mg by mouth 2 (two) times daily.   rOPINIRole 1 MG tablet Commonly known as: REQUIP Take 1 mg by mouth at bedtime.   rosuvastatin 40 MG tablet Commonly known as: CRESTOR Take 20 mg by mouth daily. MWF   tacrolimus 1 MG capsule Commonly known as: PROGRAF Take 1 mg by mouth 2 (two) times daily.   Trelegy Ellipta 100-62.5-25 MCG/INH Aepb Generic drug: Fluticasone-Umeclidin-Vilant Inhale 1 puff into the lungs daily. Started by: Chesley Mires, MD       Signature:  Chesley Mires, MD [place] Pager - [phone] 11/06/2019, 2:38 PM

## 2020-02-10 DIAGNOSIS — Z9289 Personal history of other medical treatment: Secondary | ICD-10-CM

## 2020-02-10 HISTORY — DX: Personal history of other medical treatment: Z92.89

## 2020-03-04 ENCOUNTER — Other Ambulatory Visit: Payer: Self-pay

## 2020-03-04 ENCOUNTER — Ambulatory Visit: Payer: Medicare PPO | Admitting: Pulmonary Disease

## 2020-03-04 ENCOUNTER — Encounter: Payer: Self-pay | Admitting: Pulmonary Disease

## 2020-03-04 VITALS — BP 136/72 | HR 83 | Temp 98.6°F | Ht 66.0 in | Wt 231.2 lb

## 2020-03-04 DIAGNOSIS — Z9989 Dependence on other enabling machines and devices: Secondary | ICD-10-CM | POA: Diagnosis not present

## 2020-03-04 DIAGNOSIS — G4733 Obstructive sleep apnea (adult) (pediatric): Secondary | ICD-10-CM | POA: Diagnosis not present

## 2020-03-04 DIAGNOSIS — J432 Centrilobular emphysema: Secondary | ICD-10-CM

## 2020-03-04 DIAGNOSIS — J4540 Moderate persistent asthma, uncomplicated: Secondary | ICD-10-CM | POA: Diagnosis not present

## 2020-03-04 NOTE — Progress Notes (Signed)
[facility]  Chief Complaint  Patient presents with  . Follow-up    Shortness of breath with activity    Constitutional:  BP 136/72 (BP Location: Left Arm, Cuff Size: Normal)   Pulse 83   Temp 98.6 F (37 C) (Other (Comment)) Comment (Src): wrist  Ht 5\' 6"  (1.676 m)   Wt 231 lb 3.2 oz (104.9 kg)   SpO2 96% Comment: Room air  BMI 37.32 kg/m   Past Medical History:  OA, COPD, GERD, HLD, HTN, Hypothyroidism, Nephrotic syndrome, Osteopenia  Past Surgical History:  Her  has a past surgical history that includes Rotator cuff repair (Left); Renal biopsy; Medial partial knee replacement (Bilateral); Abdominal hysterectomy; Bilateral salpingoophorectomy; Mohs surgery; Appendectomy; Conversion to total knee (Right, 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  Brief Summary:  [name] is a 80 y.o. female former smoker with obstructive sleep apnea, asthma, emphysema, pulmonary embolism, and dyspnea on exertion.      Subjective:   Patient presents today due to worsening DOE. She states this all started about 2 years ago around the time she was diagnosed with a PE. She states her breathing has only worsened since. She can only tolerate ambulating short distances. She describes a "croupy" cough and persistent runny nose. Denies any discoloration to her phlegm, sore throat, sinus pain/pressure, or fevers.   She states she used to do water aerobics regularly; however, has not recently been doing daily exercises. Discussed to resume physical activity.  She also reports she stopped  using her CPAP in December because [employer] stopped delivering supplies for her machine. She is okay to resume CPAP use pending supplies.  Physical Exam:   Appearance - well kempt   ENMT - no sinus tenderness, no oral exudate, no LAN, Mallampati 3 airway, no stridor  Respiratory - equal breath sounds bilaterally, no wheezing or rales  CV - s1s2 regular rate and rhythm, no murmurs  Ext - no clubbing, no edema  Skin - no rashes  Psych - normal mood and affect   Pulmonary testing:   PFT 06/13/19 >> FEV1 1.45 (66%), FEV1% 81, TLC 3.85 (71%), DLCO 55%  Chest Imaging:   CT chest 07/14/19 >> mild emphysema, small hiatal hernia  Sleep Tests:   PSG 08/05/19 >> AHI 26.3, SpO2 low 85%  Auto CPAP 10/11/19 to 11/02/19 >> used on 23 of 23 nights with average 9 hrs 12 min.  Average AHI 3.5 with median CPAP 8 and 95 th percentile CPAP 10 cm H2O  Cardiac Tests:   Echo 04/11/19 >> EF 65 to 70%, mild LVH, grade 1 DD  Social History:  She  reports that she quit smoking about 47 years ago. Her smoking use included cigarettes. She has a 30.00 pack-year smoking history. She has never used smokeless tobacco. She reports current alcohol use of about 0.5 standard drinks of alcohol per week. She reports that she does not use drugs.  Family History:  Her family history includes COPD in her mother; Heart disease in her paternal grandfather; Hypertension in her sister; Lymphoma in her father; Rheumatic fever in her paternal grandmother; Scoliosis in her daughter; Stroke in her maternal grandfather.     Assessment/Plan:  Obstructive sleep apnea. 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 - indefinite therapy with eliquis  Asthma, Emphysema. - continue trelegy singulair, prn  albuterol  Dyspnea on exertion. - explained how deconditioning is likely contributing to this - encouraged her to resume and maintain a regular exercise regimen  Stage 3a CKD, Minimal Change Disease. - followed by Dr. Audie Clear with nephrology at [facility]  Follow up:  Patient Instructions  Try getting back into a regular exercise regimen  Will have [employer] arrange for replacement CPAP mask and supplies  Follow up in 6 months   Medication List:   Allergies as of 03/04/2020   No Known Allergies     Medication List       Accurate as of March 04, 2020  3:46 PM. If you have any questions, ask your nurse or doctor.        acetaminophen 500 MG tablet Commonly known as: TYLENOL Take 2 tablets (1,000 mg total) by mouth every 6 (six) hours as needed for mild pain.   albuterol 108 (90 Base) MCG/ACT inhaler Commonly known as: VENTOLIN HFA Inhale 2 puffs into the lungs every 6 (six) hours as needed for wheezing.   apixaban 5 MG Tabs tablet Commonly known as: Eliquis Take 1 tablet (5 mg total) by mouth 2 (two) times daily.   Calcium-Vitamin D 600-400 MG-UNIT Tabs Take 1 tablet by mouth daily.   cholecalciferol 1000 units tablet Commonly known as: VITAMIN D Take 1,000 Units by mouth 2 (two) times daily.   citalopram 20 MG tablet Commonly known as: CELEXA Take 20 mg by mouth at bedtime.   diphenhydrAMINE 25 MG tablet Commonly known as: BENADRYL Take 25 mg by mouth at bedtime as needed for sleep.   levothyroxine 88 MCG tablet Commonly known as: SYNTHROID Take 88 mcg by mouth every morning.   losartan 100 MG tablet Commonly known as: COZAAR Take 100 mg by mouth daily.   metFORMIN 500 MG tablet Commonly known as: GLUCOPHAGE Take 1 tablet (500 mg total) by mouth daily with breakfast.   montelukast 10 MG tablet Commonly known as: SINGULAIR Take 10 mg by mouth at bedtime.   multivitamin with minerals Tabs tablet Take 1 tablet by mouth 2 (two) times daily.   omeprazole 40  MG capsule Commonly known as: PRILOSEC Take 40 mg by mouth 2 (two) times daily.   rOPINIRole 1 MG tablet Commonly known as: REQUIP Take 1 mg by mouth at bedtime.   rosuvastatin 40 MG tablet Commonly known as: CRESTOR Take 20 mg by mouth daily. MWF   tacrolimus 1 MG capsule Commonly known as: PROGRAF Take 1 mg by mouth 2 (two) times daily.   Trelegy Ellipta 100-62.5-25 MCG/INH Aepb Generic drug: Fluticasone-Umeclidin-Vilant Inhale 1 puff into the lungs daily.       Signature:  Harlow Ohms, DO PGY-2 IM  03/04/2020, 3:46 PM  Attending portion:   She is here with her daughter.  Hasn't been able to exercise as much due to treatment for skin cancer on her leg.  Was doing water aerobics.    Gets winded with activity.  Has sinus congestion with intermittent cough.  Not having chest pain, wheezing.  Was doing well with CPAP, but hasn't received new supplies  recently.   Appearance - well kempt   ENMT - no sinus tenderness, no oral exudate, no LAN, Mallampati 3 airway, no stridor  Respiratory - equal breath sounds bilaterally, no wheezing or rales  CV - s1s2 regular rate and rhythm, no murmurs  Ext - no clubbing, no edema  Skin - no rashes  Psych - normal mood and affect  Assessment/plan:  Obstructive sleep apnea. - she has been compliant with CPAP and reports benefit from therapy - gets supplies from [employer] - continue auto CPAP 5 to 10 cm H2O - will arrange for replacement mask and supplies  Emphysema, asthma. - continue trelegy, singulair, prn albuterol  Hx of pulmonary embolism. - indefinite therapy with eliquis  Dyspnea on exertion. - most of this is likely related to deconditioning at present - she will resume a gradual exercise regimen  Time spent: 33 minutes  Patient Instructions  Try getting back into a regular exercise regimen  Will have [employer] arrange for replacement CPAP mask and supplies  Follow up in 6 months  Chesley Mires, MD [place] Pager - [phone] 03/04/2020, 4:32 PM

## 2020-03-04 NOTE — Patient Instructions (Signed)
Try getting back into a regular exercise regimen  Will have [employer] arrange for replacement CPAP mask and supplies  Follow up in 6 months

## 2020-03-28 ENCOUNTER — Telehealth: Payer: Self-pay | Admitting: Pulmonary Disease

## 2020-03-28 NOTE — Telephone Encounter (Signed)
Called and spoke with patient to let her know that I have sent a message to [employer] to get an update on the order from 1/24 and once we hear back from them we will her know. She expressed understanding. Will wait to hear from [employer]

## 2020-03-28 NOTE — Telephone Encounter (Signed)
Update from Brad:  New, Caprice Kluver, Milford Cage, [place]; Miquel Dunn; Sandi Raveling, Jamas Lav; 1 other Hello,   Order was received and entered on 03-05-20 at 1101am. Our supply team took the order for processing on 03-07-20 1053am.    I have message the supply team for details on status of order. I will let you know when they reply.   Thank you,   Leroy Sea New   Will wait to further updates

## 2020-04-05 NOTE — Telephone Encounter (Signed)
New message sent to [facility] for an update.

## 2020-04-11 NOTE — Telephone Encounter (Signed)
New, Annye Asa, Zettie Pho, RN; Jacquenette Shone, Jew[place]; Sandi Raveling, Melissa; 2 others  Hello,   No update has been provided to me at this time. I emailed the team again to see if I can get you the info you need.   I do see they received the order on 03/05/20 for processing but can not see any details past that note. I will let you when I get a reply.   Thank you,   Demetrius Charity          [facility] for pt.

## 2020-04-12 NOTE — Telephone Encounter (Signed)
Left msg for pt to let us know if still needing supplies

## 2020-04-22 ENCOUNTER — Other Ambulatory Visit: Payer: Self-pay | Admitting: Critical Care Medicine

## 2020-04-22 NOTE — Telephone Encounter (Signed)
Refill request from received from pharmacy. FMR patient of Dr. Carlis Abbott has been seeing you. Order is pended below if you are ok with refilling.

## 2020-05-09 NOTE — Telephone Encounter (Signed)
UPDATE:   New, Annye Asa, Zettie Pho, RN; Darl Householder,   Thanks for the update. it looks like we attempted 2-3 more times and finally sent her a supply order. I don't see that contact was ever made but mask cushions and filters were shipped on 04-17-20.   Thank you,   Brad New   -------------------------------------------------------- Will close encounter.

## 2020-07-10 ENCOUNTER — Other Ambulatory Visit: Payer: Self-pay | Admitting: Family Medicine

## 2020-07-10 DIAGNOSIS — Z1231 Encounter for screening mammogram for malignant neoplasm of breast: Secondary | ICD-10-CM

## 2020-08-09 ENCOUNTER — Ambulatory Visit
Admission: RE | Admit: 2020-08-09 | Discharge: 2020-08-09 | Disposition: A | Discharge status: Home / Self Care | Payer: Medicare PPO | Source: Ambulatory Visit | Attending: Family Medicine | Admitting: Family Medicine

## 2020-08-09 ENCOUNTER — Other Ambulatory Visit: Payer: Self-pay

## 2020-08-09 DIAGNOSIS — Z1231 Encounter for screening mammogram for malignant neoplasm of breast: Secondary | ICD-10-CM

## 2020-09-26 IMAGING — CT CT CHEST W/O CM
2 of 4 series · 15 of 36 positions shown, 18 images · non-contrast
Comparison: Chest CT 02/07/2019.

CLINICAL DATA: Follow-up pulmonary nodule. Former smoker. History
of pulmonary embolus.

EXAM:
CT CHEST WITHOUT CONTRAST
TECHNIQUE: Multidetector CT imaging of the chest was performed following the
standard protocol without IV contrast.

[Series 2: thorax · axial · 0.71mm/px · z∈[-344,-66]mm · 12 of 165 slices shown, 15 images]
[im 13/165  mediastinal]
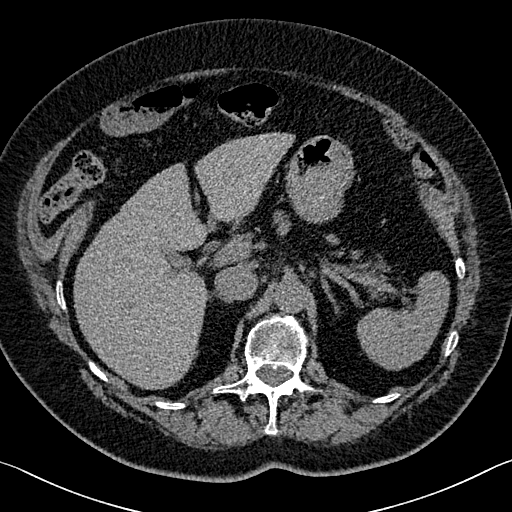
[im 13/165  lung]
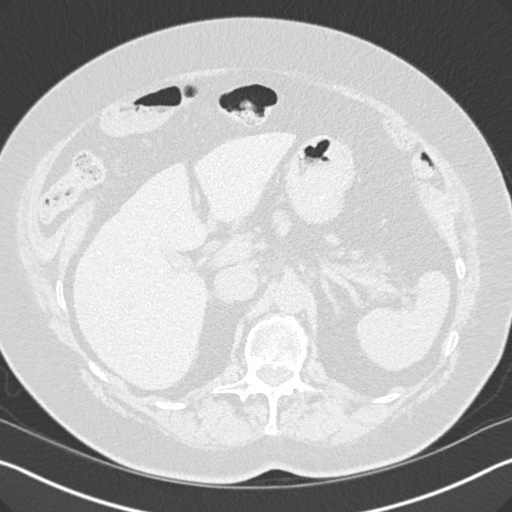
[im 26/165  lung]
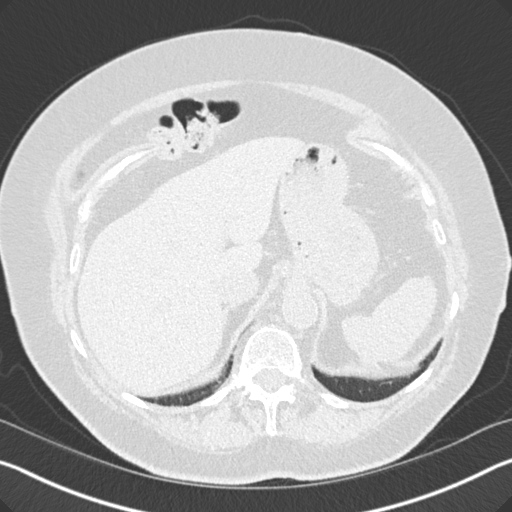
[im 38/165  lung]
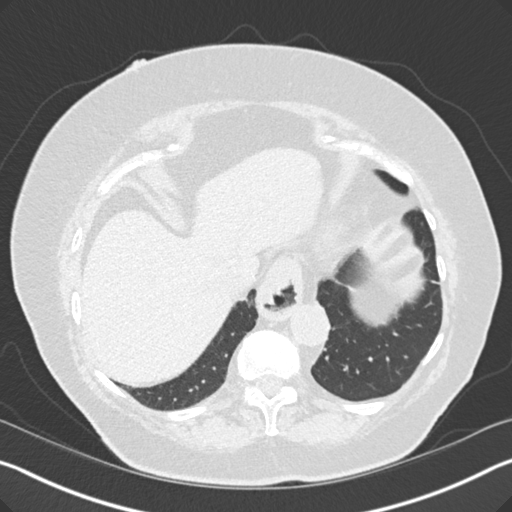
[im 51/165  lung]
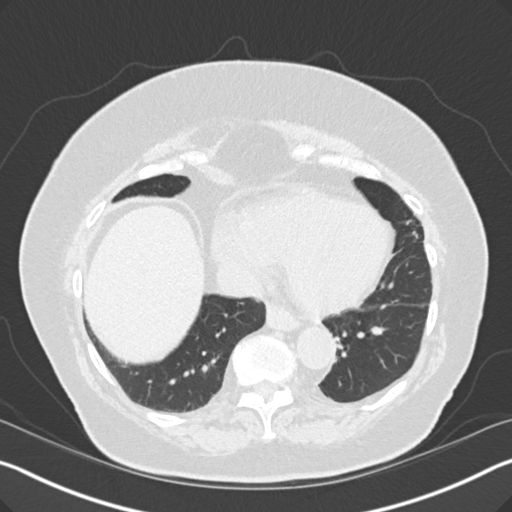
[im 64/165  mediastinal]
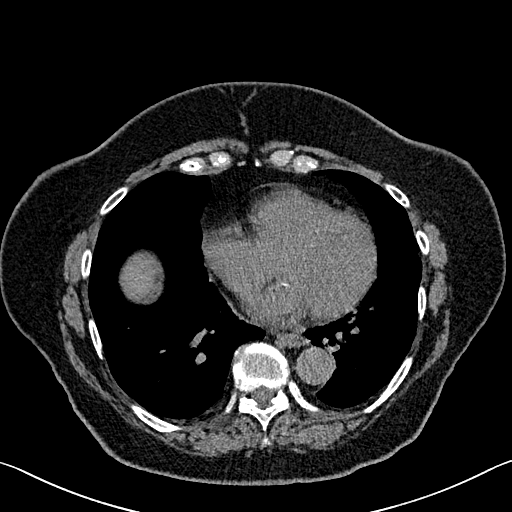
[im 64/165  lung]
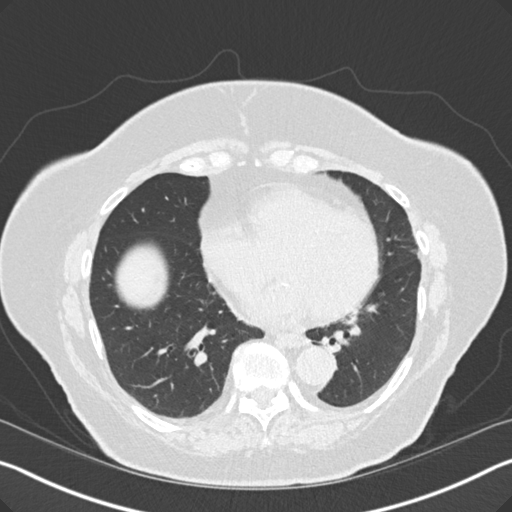
[im 76/165  lung]
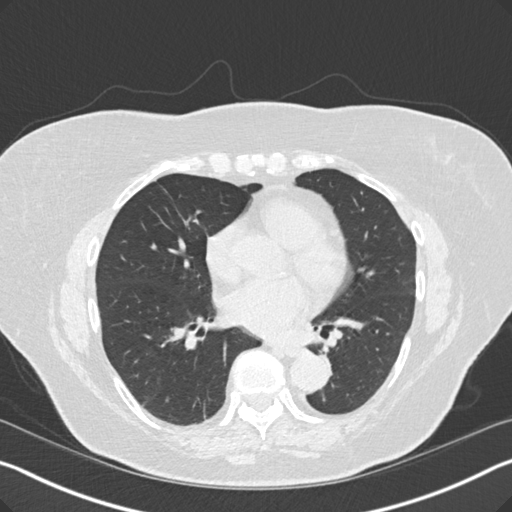
[im 89/165  lung]
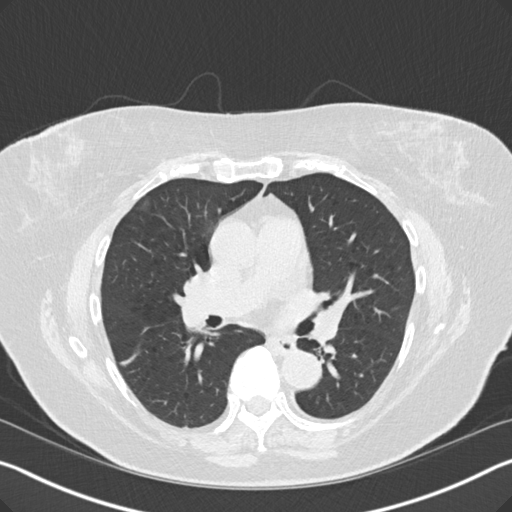
[im 101/165  lung]
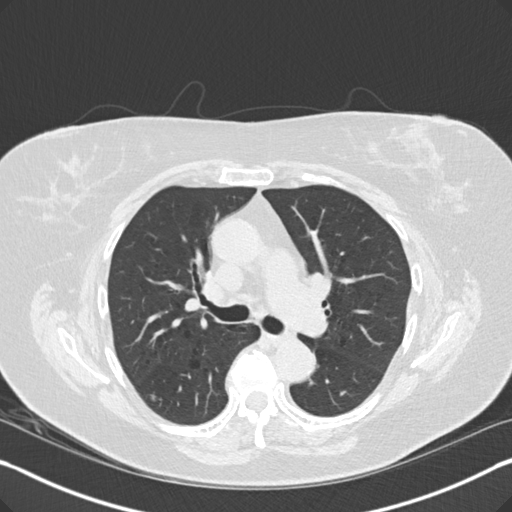
[im 114/165  mediastinal]
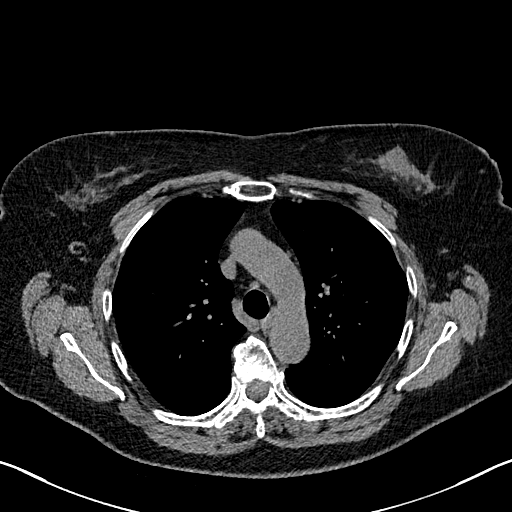
[im 114/165  lung]
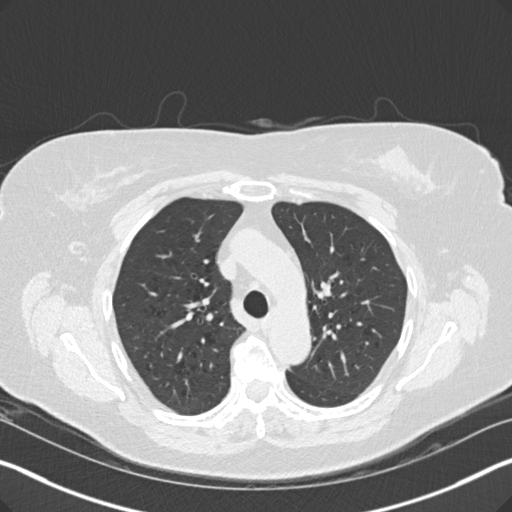
[im 127/165  lung]
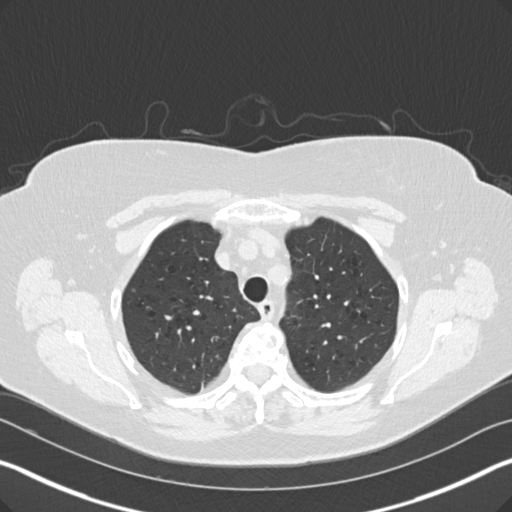
[im 139/165  lung]
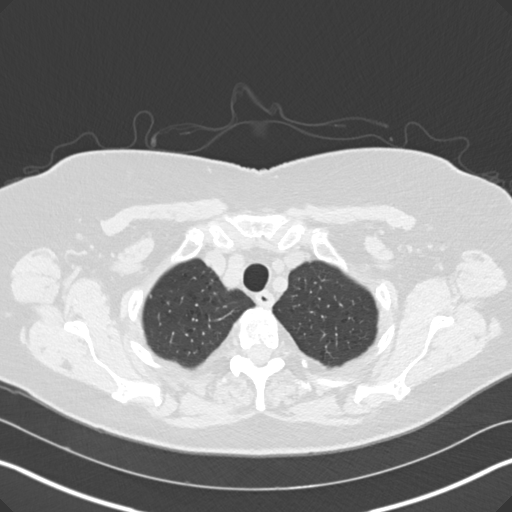
[im 152/165  lung]
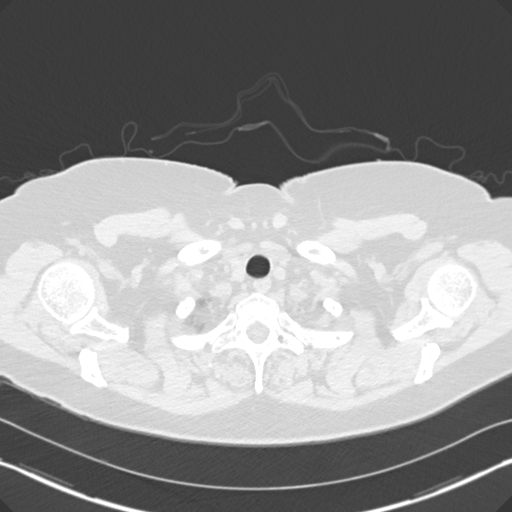

[Series 5: coronal · coronal · 0.64mm/px · 3 of 151 slices shown]
[im 31/151  lung]
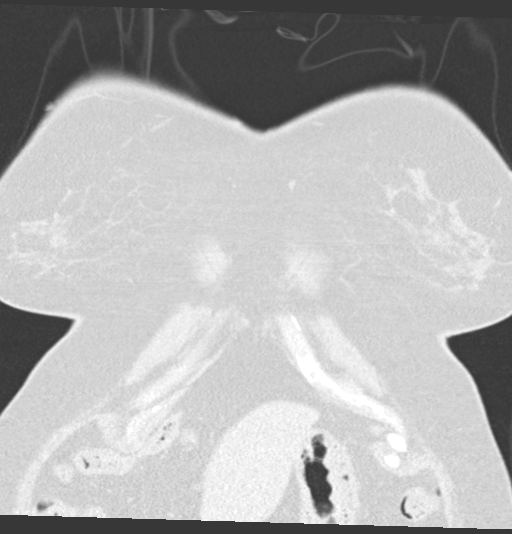
[im 61/151  lung]
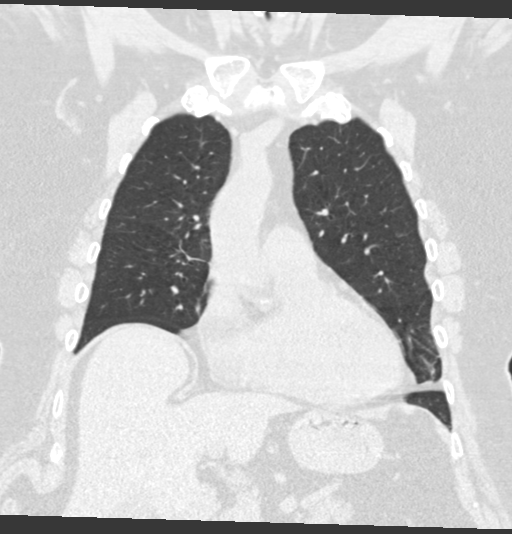
[im 91/151  lung]
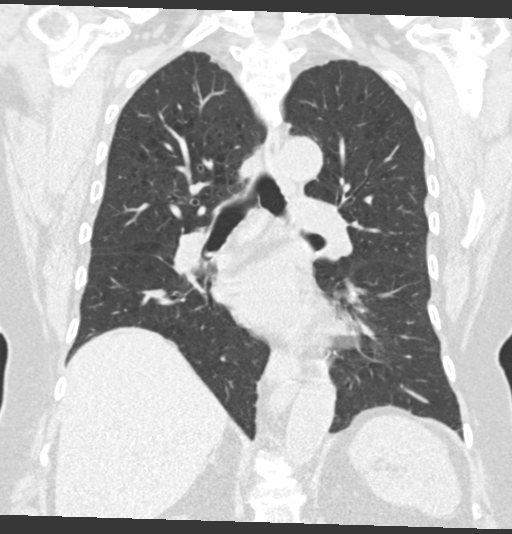

[15 of 36 positions shown; findings below may reference images not displayed]

FINDINGS: Cardiovascular: Mild aortic atherosclerosis. No aortic aneurysm.
There are coronary artery calcifications. Heart is normal in size.
No pericardial effusion.

Mediastinum/Nodes: No enlarged mediastinal lymph nodes. Small
mediastinal nodes are all subcentimeter. No bulky hilar adenopathy,
detailed evaluation limited in the absence of IV contrast. There is
a small hiatal hernia. No axillary adenopathy. No visualized thyroid
nodule.

Lungs/Pleura: The previous right upper lobe pulmonary nodule has
resolved. There is minimal linear scarring site of prior nodule
suggesting this is post infectious or inflammatory. No new or acute
pulmonary nodules. Linear atelectasis or scarring in the right
middle, right lower lobe, and lingula no pulmonary edema or pleural
fluid. The trachea and mainstem bronchi are patent. Mild apical
predominant emphysema.

Upper Abdomen: No acute findings.

Musculoskeletal: L1 superior endplate compression fracture, chronic
based on 02/06/2019 chest radiograph. There are no acute or
suspicious osseous abnormalities.
IMPRESSION: 1. The previous right upper lobe pulmonary nodule has resolved.
There is minimal linear scarring site of prior nodule suggesting
this is post infectious or inflammatory.
2. No new or acute pulmonary nodules.  No acute findings.
3. Mild emphysema.
4. Small hiatal hernia.

Aortic Atherosclerosis (0JP2W-P3B.B) and Emphysema ([id number].B).

## 2020-10-28 ENCOUNTER — Ambulatory Visit: Payer: Medicare Other | Admitting: Nurse Practitioner

## 2020-11-04 ENCOUNTER — Ambulatory Visit: Payer: Medicare Other | Admitting: Nurse Practitioner

## 2020-11-04 ENCOUNTER — Encounter: Payer: Self-pay | Admitting: Nurse Practitioner

## 2020-11-04 ENCOUNTER — Other Ambulatory Visit: Payer: Self-pay

## 2020-11-04 ENCOUNTER — Ambulatory Visit: Payer: Medicare PPO | Admitting: Nurse Practitioner

## 2020-11-04 ENCOUNTER — Other Ambulatory Visit: Payer: Self-pay | Admitting: Pulmonary Disease

## 2020-11-04 VITALS — BP 136/74 | HR 85 | Temp 97.3°F | Ht 66.5 in | Wt 222.0 lb

## 2020-11-04 DIAGNOSIS — H9193 Unspecified hearing loss, bilateral: Secondary | ICD-10-CM | POA: Diagnosis not present

## 2020-11-04 DIAGNOSIS — N050 Unspecified nephritic syndrome with minor glomerular abnormality: Secondary | ICD-10-CM

## 2020-11-04 DIAGNOSIS — K219 Gastro-esophageal reflux disease without esophagitis: Secondary | ICD-10-CM

## 2020-11-04 DIAGNOSIS — E1122 Type 2 diabetes mellitus with diabetic chronic kidney disease: Secondary | ICD-10-CM | POA: Insufficient documentation

## 2020-11-04 DIAGNOSIS — Z23 Encounter for immunization: Secondary | ICD-10-CM

## 2020-11-04 DIAGNOSIS — E782 Mixed hyperlipidemia: Secondary | ICD-10-CM

## 2020-11-04 DIAGNOSIS — E119 Type 2 diabetes mellitus without complications: Secondary | ICD-10-CM

## 2020-11-04 DIAGNOSIS — G4733 Obstructive sleep apnea (adult) (pediatric): Secondary | ICD-10-CM

## 2020-11-04 DIAGNOSIS — G2581 Restless legs syndrome: Secondary | ICD-10-CM

## 2020-11-04 DIAGNOSIS — I10 Essential (primary) hypertension: Secondary | ICD-10-CM

## 2020-11-04 DIAGNOSIS — J449 Chronic obstructive pulmonary disease, unspecified: Secondary | ICD-10-CM

## 2020-11-04 DIAGNOSIS — E039 Hypothyroidism, unspecified: Secondary | ICD-10-CM

## 2020-11-04 DIAGNOSIS — F419 Anxiety disorder, unspecified: Secondary | ICD-10-CM

## 2020-11-04 MED ORDER — CITALOPRAM HYDROBROMIDE 20 MG PO TABS: 20.0000 mg | ORAL_TABLET | Freq: Every day | ORAL | 1 refills | Status: DC

## 2020-11-04 MED ORDER — ROPINIROLE HCL 1 MG PO TABS: 1.0000 mg | ORAL_TABLET | Freq: Every day | ORAL | 1 refills | Status: DC

## 2020-11-04 MED ORDER — OMEPRAZOLE 40 MG PO CPDR: 40.0000 mg | DELAYED_RELEASE_CAPSULE | Freq: Two times a day (BID) | ORAL | 1 refills | Status: DC

## 2020-11-04 MED ORDER — ROSUVASTATIN CALCIUM 40 MG PO TABS: 40.0000 mg | ORAL_TABLET | ORAL | 1 refills | Status: DC

## 2020-11-04 MED ORDER — METFORMIN HCL 500 MG PO TABS: 1000.0000 mg | ORAL_TABLET | Freq: Two times a day (BID) | ORAL | 1 refills | Status: DC

## 2020-11-04 MED ORDER — LEVOTHYROXINE SODIUM 88 MCG PO TABS: 88.0000 ug | ORAL_TABLET | Freq: Every morning | ORAL | 1 refills | Status: DC

## 2020-11-04 NOTE — Progress Notes (Signed)
Careteam: Patient Care Team: [name], NP as PCP - General (Geriatric Medicine) Paralee Cancel, MD as Consulting Physician (Orthopedic Surgery) Jari Pigg, MD as Consulting Physician (Dermatology) Biagio Quint, MD as Referring Physician (Nephrology) Chesley Mires, MD as Consulting Physician (Pulmonary Disease)  PLACE OF SERVICE:  [place] Directive information    No Known Allergies  Chief Complaint  Patient presents with   Establish Care    New patient establish care. High fall risk. Out of thyroid medication x 1 week. Taking benadryl every night for sleep, took ambien in the past. SOB at times. Discuss flu vaccine. Broke big toe, never evaluated. Here with daughter, [name].      HPI: Patient is a 80 y.o. female to establish care   Vit d def- on vit D supplement.   Osteopenia- cal and vit d   Anxiety- controlled on celexa 20 mg daily, daughter reports she is fidgety.   CKD 3- only taking tylenol.   Insomnia- taking benadryl at bedtime.   Hx of bilateral PE- on eliquis twice daily.   Hypothyroid- on synthroid 88 mcg. Has been taking routinely.   COPD- on singulair 10 mg daily  Type 2 diabetes- on metformin 1000 mg by mouth twice daily  Nephrotic syndrome with minimal change glomerulonephritis- has been followed by nephrologist and oncologist had chemo drug. She is currently on tacrolimus and in remission.  Taking tacrolimus 1 mg twice daily   GERD- on omeprazole. Controlled.    RLS- on requip 1 mg daily- controlled   Hyperlipidemia- on crestor.   Htn- reports she has taken losartan in the past but likely has run out recently.   OSA- on CPAP  OA- right total knee and partial left   Sold her house and moving in with her daughter.   Plans to make follow up with kidney doctor and lung doctor.   Has trouble getting to sleep   Last Awv was 04/03/20  Review of Systems:  Review of Systems  Constitutional:  Negative for chills, fever  and weight loss.  HENT:  Positive for hearing loss. Negative for tinnitus.   Respiratory:  Positive for shortness of breath. Negative for cough and sputum production.   Cardiovascular:  Negative for chest pain, palpitations and leg swelling.  Gastrointestinal:  Negative for abdominal pain, constipation, diarrhea and heartburn.  Genitourinary:  Negative for dysuria, frequency and urgency.  Musculoskeletal:  Positive for back pain, joint pain and myalgias. Negative for falls.  Skin: Negative.   Neurological:  Negative for dizziness and headaches.  Endo/Heme/Allergies:  Positive for environmental allergies.  Psychiatric/Behavioral:  Negative for depression and memory loss. The patient has insomnia.    Past Medical History:  Diagnosis Date   Actinic keratosis    Arthritis    Asthma    Back injury 2019   Fracture   Basal cell carcinoma    COPD (chronic obstructive pulmonary disease) (HCC)    GERD (gastroesophageal reflux disease)    H/O blood clots 2020   Lung, Per [place] New Patient Packet   H/O mammogram 2022   Per [place] New Patient Packet   Heart murmur    hx of in childhood    High cholesterol    Hyperlipidemia    Hypertension    Hypothyroidism    Kidney disease    Per [facility] New Patient Packet   MCNS (minimal change nephrotic syndrome)    OSA (obstructive sleep apnea) 08/29/2019   Osteopenia    Shortness  of breath dyspnea    on exertion    Squamous cell carcinoma    Thyroid disease    Per [facility] New Patient Packet   Past Surgical History:  Procedure Laterality Date   ABDOMINAL HYSTERECTOMY     APPENDECTOMY     BILATERAL SALPINGOOPHORECTOMY     Ovarian Cysts    COLONOSCOPY  2001   Dr.Mann, Per [facility] New Patient Packet   CONVERSION TO TOTAL KNEE Right 07/10/2014   Procedure: RIGHT CONVERSION OF PARTIAL KNEE TO TOTAL KNEE;  Surgeon: Paralee Cancel, MD;  Location: [facility] ORS;  Service: Orthopedics;  Laterality: Right;   IR ANGIOGRAM PULMONARY BILATERAL SELECTIVE  02/08/2019   IR  ANGIOGRAM SELECTIVE EACH ADDITIONAL VESSEL  02/08/2019   IR ANGIOGRAM SELECTIVE EACH ADDITIONAL VESSEL  02/08/2019   IR INFUSION THROMBOL ARTERIAL INITIAL (MS)  02/08/2019   IR INFUSION THROMBOL ARTERIAL INITIAL (MS)  02/08/2019   IR THROMB F/U EVAL ART/VEN FINAL DAY (MS)  02/09/2019   IR US GUIDE VASC ACCESS RIGHT  02/08/2019   MEDIAL PARTIAL KNEE REPLACEMENT Bilateral    MOHS SURGERY     x 2   RENAL BIOPSY     x 2   REPLACEMENT TOTAL KNEE  2012   Per [place] New Patient Packet   ROTATOR CUFF REPAIR Left 2008   Social History:   reports that she quit smoking about 47 years ago. Her smoking use included cigarettes. She has a 37.50 pack-year smoking history. She has never used smokeless tobacco. She reports current alcohol use of about 0.5 standard drinks per week. She reports that she does not use drugs.  Family History  Problem Relation Age of Onset   COPD Mother        Husband Smoked    Lymphoma Father    Hypertension Sister    Scoliosis Daughter    Stroke Maternal Grandfather    Rheumatic fever Paternal Grandmother    Heart disease Paternal Grandfather     Medications: Patient's Medications  New Prescriptions   No medications on file  Previous Medications   CALCIUM CARB-CHOLECALCIFEROL (CALCIUM-VITAMIN D) 600-400 MG-UNIT TABS    Take 1 tablet by mouth daily.    CHOLECALCIFEROL (VITAMIN D) 1000 UNITS TABLET    Take 1,000 Units by mouth 2 (two) times daily.   ELIQUIS 5 MG TABS TABLET    TAKE 1 TABLET BY MOUTH TWICE A DAY   MONTELUKAST (SINGULAIR) 10 MG TABLET    Take 10 mg by mouth at bedtime.    MULTIPLE VITAMIN (MULTIVITAMIN WITH MINERALS) TABS TABLET    Take 1 tablet by mouth 2 (two) times daily.   TACROLIMUS (PROGRAF) 1 MG CAPSULE    Take 1 mg by mouth 2 (two) times daily.   Modified Medications   Modified Medication Previous Medication   CITALOPRAM (CELEXA) 20 MG TABLET citalopram (CELEXA) 20 MG tablet      Take 1 tablet (20 mg total) by mouth at bedtime.    Take 20 mg  by mouth at bedtime.    LEVOTHYROXINE (SYNTHROID) 88 MCG TABLET levothyroxine (SYNTHROID, LEVOTHROID) 88 MCG tablet      Take 1 tablet (88 mcg total) by mouth every morning.    Take 88 mcg by mouth every morning.   METFORMIN (GLUCOPHAGE) 500 MG TABLET metFORMIN (GLUCOPHAGE) 500 MG tablet      Take 2 tablets (1,000 mg total) by mouth 2 (two) times daily with a meal.    Take 1,000 mg by mouth 2 (two) times  daily with a meal.   OMEPRAZOLE (PRILOSEC) 40 MG CAPSULE omeprazole (PRILOSEC) 40 MG capsule      Take 1 capsule (40 mg total) by mouth 2 (two) times daily.    Take 40 mg by mouth 2 (two) times daily.   ROPINIROLE (REQUIP) 1 MG TABLET rOPINIRole (REQUIP) 1 MG tablet      Take 1 tablet (1 mg total) by mouth at bedtime.    Take 1 mg by mouth at bedtime.   ROSUVASTATIN (CRESTOR) 40 MG TABLET rosuvastatin (CRESTOR) 40 MG tablet      Take 1 tablet (40 mg total) by mouth every other day.    Take 40 mg by mouth every other day.  Discontinued Medications   DIPHENHYDRAMINE (BENADRYL) 25 MG TABLET    Take 25 mg by mouth at bedtime. For sleep    Physical Exam:  Vitals:   11/04/20 1346  BP: 136/74  Pulse: 85  Temp: (!) 97.3 F (36.3 C)  TempSrc: Temporal  SpO2: 95%  Weight: 222 lb (100.7 kg)  Height: 5' 6.5" (1.689 m)   Body mass index is 35.29 kg/m. Wt Readings from Last 3 Encounters:  11/04/20 222 lb (100.7 kg)  03/04/20 231 lb 3.2 oz (104.9 kg)  11/06/19 233 lb 9.6 oz (106 kg)    Physical Exam Constitutional:      General: She is not in acute distress.    Appearance: She is well-developed. She is not diaphoretic.  HENT:     Head: Normocephalic and atraumatic.     Mouth/Throat:     Pharynx: No oropharyngeal exudate.  Eyes:     Conjunctiva/sclera: Conjunctivae normal.     Pupils: Pupils are equal, round, and reactive to light.  Cardiovascular:     Rate and Rhythm: Normal rate and regular rhythm.     Heart sounds: Normal heart sounds.  Pulmonary:     Effort: Pulmonary effort is  normal.     Breath sounds: Normal breath sounds.  Abdominal:     General: Bowel sounds are normal.     Palpations: Abdomen is soft.  Musculoskeletal:     Cervical back: Normal range of motion and neck supple.     Right lower leg: No edema.     Left lower leg: No edema.  Skin:    General: Skin is warm and dry.  Neurological:     Mental Status: She is alert.  Psychiatric:        Mood and Affect: Mood normal.    Labs reviewed: Basic Metabolic Panel: No results for input(s): NA, K, CL, CO2, GLUCOSE, BUN, CREATININE, CALCIUM, MG, PHOS, TSH in the last 8760 hours. Liver Function Tests: No results for input(s): AST, ALT, ALKPHOS, BILITOT, PROT, ALBUMIN in the last 8760 hours. No results for input(s): LIPASE, AMYLASE in the last 8760 hours. No results for input(s): AMMONIA in the last 8760 hours. CBC: No results for input(s): WBC, NEUTROABS, HGB, HCT, MCV, PLT in the last 8760 hours. Lipid Panel: No results for input(s): CHOL, HDL, LDLCALC, TRIG, CHOLHDL, LDLDIRECT in the last 8760 hours. TSH: No results for input(s): TSH in the last 8760 hours. A1C: Lab Results  Component Value Date   HGBA1C 8.4 (H) 02/07/2019     Assessment/Plan 1. Bilateral hearing loss, unspecified hearing loss type Hear lavage completed today will also have her evaluated by audiology  - Ambulatory referral to Audiology  2. OSA (obstructive sleep apnea) On cpap  3. Chronic obstructive pulmonary disease, unspecified COPD type ([place]) -followed  by pulmonary due to COPD/asthma. She was prescribed trelegy and Singulair which she is not taking.  -recommend to restart medication as prescribed due to worsening shortness of breath   4. Minimal change disease Followed by Dr Audie Clear with nephrology at [facility]. Reviewed note from care everywhere from 07/2019 (this is the last follow up seen in epic- may need to have pt sign record release to get most updated visit). Continues on prograf   5. GERD without  esophagitis Controlled on omeprazole with dietary recommendations recommended.  - omeprazole (PRILOSEC) 40 MG capsule; Take 1 capsule (40 mg total) by mouth 2 (two) times daily.  Dispense: 90 capsule; Refill: 1  6. Anxiety Controlled on celexa - citalopram (CELEXA) 20 MG tablet; Take 1 tablet (20 mg total) by mouth at bedtime.  Dispense: 90 tablet; Refill: 1  7. Acquired hypothyroidism Last TSH 10/31/19 at goal. 1.7. will need to repeat at next visit  - levothyroxine (SYNTHROID) 88 MCG tablet; Take 1 tablet (88 mcg total) by mouth every morning.  Dispense: 90 tablet; Refill: 1  8. Mixed hyperlipidemia LDL at 95 on 10/31/2019 will need to recheck with next labs.  - rosuvastatin (CRESTOR) 40 MG tablet; Take 1 tablet (40 mg total) by mouth every other day.  Dispense: 90 tablet; Refill: 1  9. Diabetes mellitus without complication ([place]) A6T 6.8 in Feb 2022, will need to follow up on next labs  -Encouraged dietary compliance, routine foot care/monitoring and to keep up with diabetic eye exams through ophthalmology  - metFORMIN (GLUCOPHAGE) 500 MG tablet; Take 2 tablets (1,000 mg total) by mouth 2 (two) times daily with a meal.  Dispense: 180 tablet; Refill: 1  10. RLS (restless legs syndrome) -stable on requip, notices when she misses a dose and has to get out of bed to take medication.  - rOPINIRole (REQUIP) 1 MG tablet; Take 1 tablet (1 mg total) by mouth at bedtime.  Dispense: 90 tablet; Refill: 1  11. Essential hypertension, benign Controlled on current regimen   12. Pulmonary embolism  -noted in 2020, she is on indefinite therapy with eliquis     Next appt: 4 weeks.  Carlos American. [place], [facility] [phone]

## 2020-11-06 ENCOUNTER — Other Ambulatory Visit: Payer: Self-pay | Admitting: Nurse Practitioner

## 2020-11-06 ENCOUNTER — Other Ambulatory Visit: Payer: Self-pay | Admitting: Pulmonary Disease

## 2020-11-06 DIAGNOSIS — K219 Gastro-esophageal reflux disease without esophagitis: Secondary | ICD-10-CM

## 2020-11-26 ENCOUNTER — Other Ambulatory Visit: Payer: Self-pay | Admitting: Nurse Practitioner

## 2020-11-26 DIAGNOSIS — E782 Mixed hyperlipidemia: Secondary | ICD-10-CM

## 2020-11-26 DIAGNOSIS — I10 Essential (primary) hypertension: Secondary | ICD-10-CM

## 2020-11-26 DIAGNOSIS — E119 Type 2 diabetes mellitus without complications: Secondary | ICD-10-CM

## 2020-11-26 DIAGNOSIS — E039 Hypothyroidism, unspecified: Secondary | ICD-10-CM

## 2020-11-27 ENCOUNTER — Other Ambulatory Visit: Payer: Self-pay

## 2020-11-27 ENCOUNTER — Other Ambulatory Visit: Payer: Medicare PPO

## 2020-11-27 DIAGNOSIS — I10 Essential (primary) hypertension: Secondary | ICD-10-CM

## 2020-11-27 DIAGNOSIS — E039 Hypothyroidism, unspecified: Secondary | ICD-10-CM

## 2020-11-27 DIAGNOSIS — E782 Mixed hyperlipidemia: Secondary | ICD-10-CM

## 2020-11-27 DIAGNOSIS — E119 Type 2 diabetes mellitus without complications: Secondary | ICD-10-CM

## 2020-11-29 LAB — CBC WITH DIFFERENTIAL/PLATELET
Absolute Monocytes: 784 cells/uL (ref 200–950)
Basophils Absolute: 59 cells/uL (ref 0–200)
Basophils Relative: 0.8 %
Eosinophils Absolute: 207 cells/uL (ref 15–500)
Eosinophils Relative: 2.8 %
HCT: 35.6 % (ref 35.0–45.0)
Hemoglobin: 10.4 g/dL — ABNORMAL LOW (ref 11.7–15.5)
Lymphs Abs: 1288 cells/uL (ref 850–3900)
MCH: 22.5 pg — ABNORMAL LOW (ref 27.0–33.0)
MCHC: 29.2 g/dL — ABNORMAL LOW (ref 32.0–36.0)
MCV: 76.9 fL — ABNORMAL LOW (ref 80.0–100.0)
MPV: 10.5 fL (ref 7.5–12.5)
Monocytes Relative: 10.6 %
Neutro Abs: 5062 cells/uL (ref 1500–7800)
Neutrophils Relative %: 68.4 %
Platelets: 353 10*3/uL (ref 140–400)
RBC: 4.63 10*6/uL (ref 3.80–5.10)
RDW: 17 % — ABNORMAL HIGH (ref 11.0–15.0)
Total Lymphocyte: 17.4 %
WBC: 7.4 10*3/uL (ref 3.8–10.8)

## 2020-11-29 LAB — COMPLETE METABOLIC PANEL WITH GFR
AG Ratio: 1.3 (calc) (ref 1.0–2.5)
ALT: 10 U/L (ref 6–29)
AST: 12 U/L (ref 10–35)
Albumin: 3.9 g/dL (ref 3.6–5.1)
Alkaline phosphatase (APISO): 52 U/L (ref 37–153)
BUN/Creatinine Ratio: 18 (calc) (ref 6–22)
BUN: 17 mg/dL (ref 7–25)
CO2: 31 mmol/L (ref 20–32)
Calcium: 9.3 mg/dL (ref 8.6–10.4)
Chloride: 102 mmol/L (ref 98–110)
Creat: 0.96 mg/dL — ABNORMAL HIGH (ref 0.60–0.95)
Globulin: 3 g/dL (calc) (ref 1.9–3.7)
Glucose, Bld: 123 mg/dL — ABNORMAL HIGH (ref 65–99)
Potassium: 4.2 mmol/L (ref 3.5–5.3)
Sodium: 141 mmol/L (ref 135–146)
Total Bilirubin: 0.3 mg/dL (ref 0.2–1.2)
Total Protein: 6.9 g/dL (ref 6.1–8.1)
eGFR: 59 mL/min/{1.73_m2} — ABNORMAL LOW (ref >=60)

## 2020-11-29 LAB — HEMOGLOBIN A1C
Hgb A1c MFr Bld: 6.7 % of total Hgb — ABNORMAL HIGH (ref <5.7)
Mean Plasma Glucose: 146 mg/dL
eAG (mmol/L): 8.1 mmol/L

## 2020-11-29 LAB — TSH: TSH: 1.99 mIU/L (ref 0.40–4.50)

## 2020-11-29 LAB — IRON,TIBC AND FERRITIN PANEL
%SAT: 5 % (calc) — ABNORMAL LOW (ref 16–45)
Ferritin: 9 ng/mL — ABNORMAL LOW (ref 16–288)
Iron: 22 ug/dL — ABNORMAL LOW (ref 45–160)
TIBC: 452 mcg/dL (calc) — ABNORMAL HIGH (ref 250–450)

## 2020-11-29 LAB — TEST AUTHORIZATION

## 2020-11-29 LAB — LIPID PANEL
Cholesterol: 112 mg/dL (ref <200)
HDL: 56 mg/dL (ref >=50)
LDL Cholesterol (Calc): 36 mg/dL (calc)
Non-HDL Cholesterol (Calc): 56 mg/dL (calc) (ref <130)
Total CHOL/HDL Ratio: 2 (calc) (ref <5.0)
Triglycerides: 122 mg/dL (ref <150)

## 2020-12-02 ENCOUNTER — Ambulatory Visit: Payer: Medicare PPO | Admitting: Nurse Practitioner

## 2020-12-02 ENCOUNTER — Encounter: Payer: Self-pay | Admitting: Nurse Practitioner

## 2020-12-02 ENCOUNTER — Other Ambulatory Visit: Payer: Self-pay

## 2020-12-02 VITALS — BP 120/78 | HR 86 | Temp 97.1°F | Ht 67.0 in | Wt 221.0 lb

## 2020-12-02 DIAGNOSIS — J449 Chronic obstructive pulmonary disease, unspecified: Secondary | ICD-10-CM

## 2020-12-02 DIAGNOSIS — E782 Mixed hyperlipidemia: Secondary | ICD-10-CM | POA: Diagnosis not present

## 2020-12-02 DIAGNOSIS — F419 Anxiety disorder, unspecified: Secondary | ICD-10-CM

## 2020-12-02 DIAGNOSIS — R2681 Unsteadiness on feet: Secondary | ICD-10-CM

## 2020-12-02 DIAGNOSIS — E039 Hypothyroidism, unspecified: Secondary | ICD-10-CM | POA: Diagnosis not present

## 2020-12-02 DIAGNOSIS — I10 Essential (primary) hypertension: Secondary | ICD-10-CM

## 2020-12-02 DIAGNOSIS — D508 Other iron deficiency anemias: Secondary | ICD-10-CM

## 2020-12-02 DIAGNOSIS — N050 Unspecified nephritic syndrome with minor glomerular abnormality: Secondary | ICD-10-CM

## 2020-12-02 DIAGNOSIS — E119 Type 2 diabetes mellitus without complications: Secondary | ICD-10-CM | POA: Diagnosis not present

## 2020-12-02 MED ORDER — TRELEGY ELLIPTA 100-62.5-25 MCG/ACT IN AEPB: 1.0000 | INHALATION_SPRAY | Freq: Every day | RESPIRATORY_TRACT | 0 refills | Status: DC

## 2020-12-02 MED ORDER — ROSUVASTATIN CALCIUM 10 MG PO TABS: 10.0000 mg | ORAL_TABLET | Freq: Every day | ORAL | 1 refills | Status: DC

## 2020-12-02 MED ORDER — MONTELUKAST SODIUM 10 MG PO TABS: 10.0000 mg | ORAL_TABLET | Freq: Every day | ORAL | 0 refills | Status: DC

## 2020-12-02 MED ORDER — ROSUVASTATIN CALCIUM 10 MG PO TABS: 20.0000 mg | ORAL_TABLET | Freq: Every day | ORAL | 1 refills | Status: DC

## 2020-12-02 MED ORDER — METFORMIN HCL 500 MG PO TABS: 500.0000 mg | ORAL_TABLET | Freq: Two times a day (BID) | ORAL | 1 refills | Status: DC

## 2020-12-02 NOTE — Patient Instructions (Addendum)
Biagio Quint, MD -- call for follow up [phone] (Work) [phone] (Fax) [address] [place], [place] [address] Nephrology  Sign record release for ophthalmology so we can get your most recent diabetic eye exam.   Restart crestor at 10 mg daily - NEW RX  Decrease metformin to 500 mg by mouth twice daily   Diabetic diet- make sure to decrease carbohydrates and sugar in take.   Start iron (ferrous sulfate 325 mg) by mouth twice daily-- this is over the counter.   If needed benefiber or metamucil daily for diarrhea.

## 2020-12-02 NOTE — Progress Notes (Signed)
Careteam: Patient Care Team: [name], NP as PCP - General (Geriatric Medicine) [name], MD as Consulting Physician (Orthopedic Surgery) [name], MD as Consulting Physician (Dermatology) [name], MD as Referring Physician (Nephrology) [name], MD as Consulting Physician (Pulmonary Disease)  PLACE OF SERVICE:  [facility]  [place] Directive information    No Known Allergies  Chief Complaint  Patient presents with   Follow-up    4 week follow-up. Foot exam and urine microalbumin today. Recheck TSH. Discuss recent labs      HPI: Patient is a 80 y.o. female for routine follow up.   Looking back has not seen nephrologist since 07/2019. Needs follow up.  She did not restart her medication after last visit. Reports she could not get refill because she is overdue for follow up.  Daughter reports breathing has no been good. No refills on file.   She has been out of the rosuvastatin a month or 2.   Reports she is having explosive diarrhea. She had normal BMs until she was on metformin.   Anemia- not on iron, tired a lot. No dark stools or blood in your stool  Napping a lot.   RLS is worse.   Daughter is trying to help her with diet.  Requesting a walker. When she walks longer distances she has trouble balancing. Hx of falls. Very sedentary.  Needs handicap placard- due to shortness of breath and gait    Review of Systems:  Review of Systems  Constitutional:  Positive for malaise/fatigue. Negative for chills, fever and weight loss.  HENT:  Negative for tinnitus.   Respiratory:  Positive for shortness of breath. Negative for cough and sputum production.   Cardiovascular:  Negative for chest pain, palpitations and leg swelling.  Gastrointestinal:  Negative for abdominal pain, constipation, diarrhea and heartburn.  Genitourinary:  Negative for dysuria, frequency and urgency.  Musculoskeletal:  Negative for back pain, falls, joint pain and  myalgias.  Skin: Negative.   Neurological:  Negative for dizziness and headaches.  Psychiatric/Behavioral:  Negative for depression and memory loss. The patient does not have insomnia.    Past Medical History:  Diagnosis Date   Actinic keratosis    Arthritis    Asthma    Back injury 2019   Fracture   Basal cell carcinoma    COPD (chronic obstructive pulmonary disease) (HCC)    GERD (gastroesophageal reflux disease)    H/O blood clots 2020   Lung, Per [facility] New Patient Packet   H/O mammogram 2022   Per [facility] New Patient Packet   Heart murmur    hx of in childhood    High cholesterol    Hyperlipidemia    Hypertension    Hypothyroidism    Kidney disease    Per [facility] New Patient Packet   MCNS (minimal change nephrotic syndrome)    OSA (obstructive sleep apnea) 08/29/2019   Osteopenia    Shortness of breath dyspnea    on exertion    Squamous cell carcinoma    Thyroid disease    Per [facility] New Patient Packet   Past Surgical History:  Procedure Laterality Date   ABDOMINAL HYSTERECTOMY     APPENDECTOMY     BILATERAL SALPINGOOPHORECTOMY     Ovarian Cysts    COLONOSCOPY  2001   Dr.Mann, Per [facility] New Patient Packet   CONVERSION TO TOTAL KNEE Right 07/10/2014   Procedure: RIGHT CONVERSION OF PARTIAL KNEE TO TOTAL KNEE;  Surgeon: [name],  MD;  Location: [facility];  Service: Orthopedics;  Laterality: Right;   IR ANGIOGRAM PULMONARY BILATERAL SELECTIVE  02/08/2019   IR ANGIOGRAM SELECTIVE EACH ADDITIONAL VESSEL  02/08/2019   IR ANGIOGRAM SELECTIVE EACH ADDITIONAL VESSEL  02/08/2019   IR INFUSION THROMBOL ARTERIAL INITIAL (MS)  02/08/2019   IR INFUSION THROMBOL ARTERIAL INITIAL (MS)  02/08/2019   IR THROMB F/U EVAL ART/VEN FINAL DAY (MS)  02/09/2019   IR US GUIDE VASC ACCESS RIGHT  02/08/2019   MEDIAL PARTIAL KNEE REPLACEMENT Bilateral    MOHS SURGERY     x 2   RENAL BIOPSY     x 2   REPLACEMENT TOTAL KNEE  2012   Per [place] New Patient Packet   ROTATOR CUFF REPAIR Left 2008    Social History:   reports that she quit smoking about 47 years ago. Her smoking use included cigarettes. She has a 37.50 pack-year smoking history. She has never used smokeless tobacco. She reports current alcohol use of about 0.5 standard drinks per week. She reports that she does not use drugs.  Family History  Problem Relation Age of Onset   COPD Mother        Husband Smoked    Lymphoma Father    Hypertension Sister    Scoliosis Daughter    Stroke Maternal Grandfather    Rheumatic fever Paternal Grandmother    Heart disease Paternal Grandfather     Medications: Patient's Medications  New Prescriptions   No medications on file  Previous Medications   ALBUTEROL (VENTOLIN HFA) 108 (90 BASE) MCG/ACT INHALER    Inhale 1 puff into the lungs as needed for wheezing or shortness of breath.   CALCIUM CARB-CHOLECALCIFEROL (CALCIUM-VITAMIN D) 600-400 MG-UNIT TABS    Take 1 tablet by mouth daily.   CHOLECALCIFEROL (VITAMIN D) 1000 UNITS TABLET    Take 1,000 Units by mouth 2 (two) times daily.   CITALOPRAM (CELEXA) 20 MG TABLET    Take 1 tablet (20 mg total) by mouth at bedtime.   ELIQUIS 5 MG TABS TABLET    TAKE 1 TABLET BY MOUTH TWICE A DAY   FERROUS SULFATE 325 (65 FE) MG EC TABLET    Take 325 mg by mouth in the morning and at bedtime.   LEVOTHYROXINE (SYNTHROID) 88 MCG TABLET    Take 1 tablet (88 mcg total) by mouth every morning.   METFORMIN (GLUCOPHAGE) 500 MG TABLET    Take 2 tablets (1,000 mg total) by mouth 2 (two) times daily with a meal.   MULTIPLE VITAMIN (MULTIVITAMIN WITH MINERALS) TABS TABLET    Take 1 tablet by mouth 2 (two) times daily.   OMEPRAZOLE (PRILOSEC) 40 MG CAPSULE    TAKE 1 CAPSULE BY MOUTH TWICE A DAY   ROPINIROLE (REQUIP) 1 MG TABLET    Take 1 tablet (1 mg total) by mouth at bedtime.   TACROLIMUS (PROGRAF) 1 MG CAPSULE    Take 1 mg by mouth 2 (two) times daily.   Modified Medications   Modified Medication Previous Medication   FLUTICASONE-UMECLIDIN-VILANT  (TRELEGY ELLIPTA) 100-62.5-25 MCG/ACT AEPB Fluticasone-Umeclidin-Vilant (TRELEGY ELLIPTA) 100-62.5-25 MCG/ACT AEPB      Inhale 1 puff into the lungs daily.    Inhale 1 puff into the lungs daily.   MONTELUKAST (SINGULAIR) 10 MG TABLET montelukast (SINGULAIR) 10 MG tablet      Take 1 tablet (10 mg total) by mouth at bedtime.    Take 10 mg by mouth at bedtime.   Discontinued Medications  ROSUVASTATIN (CRESTOR) 40 MG TABLET    Take 1 tablet (40 mg total) by mouth every other day.    Physical Exam:  Vitals:   12/02/20 1312  BP: 120/78  Pulse: 86  Temp: (!) 97.1 F (36.2 C)  TempSrc: Temporal  SpO2: 96%  Weight: 221 lb (100.2 kg)  Height: $Remove'5\' 7"'kimkHdQ$  (1.702 m)   Body mass index is 34.61 kg/m. Wt Readings from Last 3 Encounters:  12/02/20 221 lb (100.2 kg)  11/04/20 222 lb (100.7 kg)  03/04/20 231 lb 3.2 oz (104.9 kg)    Physical Exam Constitutional:      General: She is not in acute distress.    Appearance: She is well-developed. She is not diaphoretic.  HENT:     Head: Normocephalic and atraumatic.     Mouth/Throat:     Pharynx: No oropharyngeal exudate.  Eyes:     Conjunctiva/sclera: Conjunctivae normal.     Pupils: Pupils are equal, round, and reactive to light.  Cardiovascular:     Rate and Rhythm: Normal rate and regular rhythm.     Heart sounds: Normal heart sounds.  Pulmonary:     Effort: Pulmonary effort is normal.     Breath sounds: Normal breath sounds.  Abdominal:     General: Bowel sounds are normal.     Palpations: Abdomen is soft.  Musculoskeletal:     Cervical back: Normal range of motion and neck supple.     Right lower leg: No edema.     Left lower leg: No edema.  Skin:    General: Skin is warm and dry.  Neurological:     Mental Status: She is alert and oriented to person, place, and time. Mental status is at baseline.  Psychiatric:        Mood and Affect: Mood normal.    Labs reviewed: Basic Metabolic Panel: Recent Labs    11/27/20 0942  NA 141   K 4.2  CL 102  CO2 31  GLUCOSE 123*  BUN 17  CREATININE 0.96*  CALCIUM 9.3  TSH 1.99   Liver Function Tests: Recent Labs    11/27/20 0942  AST 12  ALT 10  BILITOT 0.3  PROT 6.9   No results for input(s): LIPASE, AMYLASE in the last 8760 hours. No results for input(s): AMMONIA in the last 8760 hours. CBC: Recent Labs    11/27/20 0942  WBC 7.4  NEUTROABS 5,062  HGB 10.4*  HCT 35.6  MCV 76.9*  PLT 353   Lipid Panel: Recent Labs    11/27/20 0942  CHOL 112  HDL 56  LDLCALC 36  TRIG 122  CHOLHDL 2.0   TSH: Recent Labs    11/27/20 0942  TSH 1.99   A1C: Lab Results  Component Value Date   HGBA1C 6.7 (H) 11/27/2020     Assessment/Plan 1. Diabetes mellitus without complication ([place]) -Z6X at goal, experiencing bad diarrhea with metformin. Will decrease metformin to 500 mg BID also to add more fiber in diet and can add benefiber daily to liquid of choice  -Encouraged dietary compliance-and stressed this with decreasing metformin, routine foot care/monitoring and to keep up with diabetic eye exams through ophthalmology  - Hemoglobin A1c; Future in 3 months, if A1c trending up or still having diarrhea will have to change diabetic medication.  - metFORMIN (GLUCOPHAGE) 500 MG tablet; Take 1 tablet (500 mg total) by mouth 2 (two) times daily with a meal.  Dispense: 90 tablet; Refill: 1  2. Acquired hypothyroidism  Tsh at goal.   3. Essential hypertension, benign -stable on current regimen.  - CBC with Differential/Platelet; Future - CMP with eGFR([facility]); Future  4. Mixed hyperlipidemia She states she has been out of crestor (40 mg every other day) for a couple of months but LDL at goal and improved from previous. Will have her restart crestor 10 mg daily and will recheck lipids in 3 months.  - Lipid panel; Future - rosuvastatin (CRESTOR) 10 MG tablet; Take 1 tablets (10 mg total) by mouth daily.  Dispense: 90 tablet; Refill: 1  5. Minimal change  disease Kidney function stable, has not had follow up with nephrologist in over a year. Recommended follow pu at this time.   6. Chronic obstructive pulmonary disease, unspecified COPD type ([place]) Worsening shortness of breath, has follow up with pulmonary scheduled next month but needs refill before then- given today and to keep follow up with pulmonologist.  Gilford Rile rolling - montelukast (SINGULAIR) 10 MG tablet; Take 1 tablet (10 mg total) by mouth at bedtime.  Dispense: 30 tablet; Refill: 0 - Fluticasone-Umeclidin-Vilant (TRELEGY ELLIPTA) 100-62.5-25 MCG/ACT AEPB; Inhale 1 puff into the lungs daily.  Dispense: 28 each; Refill: 0  7. Iron deficiency anemia secondary to inadequate dietary iron intake -to start ferrous sulfate 325 mg by mouth twice daily  - Fecal occult blood, imunochemical; Future - Walker rolling - Iron, TIBC and Ferritin Panel; Future  8. Gait instability - Walker rolling -handicap placard renewed    Next appt: 3 months, labs prior.  Carlos American. [place], [facility] [phone]

## 2020-12-03 ENCOUNTER — Ambulatory Visit: Payer: Medicare PPO | Attending: Nurse Practitioner | Admitting: Audiology

## 2020-12-03 ENCOUNTER — Telehealth: Payer: Self-pay

## 2020-12-03 DIAGNOSIS — H90A22 Sensorineural hearing loss, unilateral, left ear, with restricted hearing on the contralateral side: Secondary | ICD-10-CM | POA: Diagnosis not present

## 2020-12-03 DIAGNOSIS — H90A31 Mixed conductive and sensorineural hearing loss, unilateral, right ear with restricted hearing on the contralateral side: Secondary | ICD-10-CM | POA: Diagnosis present

## 2020-12-03 NOTE — Telephone Encounter (Signed)
Spoke with patient, patient had not picked up rx yet. Patient informed that I left a message on voicemail for the pharmacist to change rx to 10 mg daily versus 20 mg daily.  Patient verbalized understanding to only take 10 mg daily

## 2020-12-03 NOTE — Telephone Encounter (Signed)
Per secure chat from [name], NP yesterday:  Will you call [facility] and let them know I meant to send in crestor 10 mg daily (not 20)  Left message on voicemail for patient to return call when available, reason for call was to inform patient of this mix-up and to confirm comprehension of how much crestor she is to take daily.   I called the pharmacy and left a message on voicemail informing them of the above, if patient has not already picked up rx.   Awaiting return call from the patient

## 2020-12-03 NOTE — Procedures (Signed)
  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    History: Darien was seen for an audiological evaluation due to decreased hearing in the right ear occurring for the past 6-7 years. She reports her right ear feels "like she is underwater, and it pops and crackles." Orly also reports she feels "she is talking in a barrel." Denai reports right aural fullness. She denies otalgia, tinnitus and vertigo. She was accompanied to the appointment today by her sister.   Evaluation:  Otoscopy showed scant wax, bilaterally Tympanometry results were consistent in the right ear with no tympanic membrane mobility and middle ear dysfunction and in the left ear with normal middle ear pressure and normal tympanic membrane mobility.  Audiometric testing was completed using Conventional Audiometry techniques with insert earphones and TDH headphones. 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 Sensorineural asymmetry noted at [phone], worse in the right ear.  Speech Recognition Thresholds were obtained at 55 dB HL in the right ear and at 25  dB HL in the left ear. Word Recognition Testing was completed at 70 dB HL and Stacy scored 100% in the left ear and was completed at 80 dB HL and Rafeef scored 96%.    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  Sensorineural asymmetry noted at [phone], worse in the right ear. It is recommended for Willma to be referred to an Ear, Nose, and Throat Physician due to asymmetric hearing loss.  Markeshia will have hearing difficulties in many listening environments. She will benefit from the use of good communication strategies, assistive listening devices, and amplification. The test results were reviewed with Elva and her sister.   Recommendations: 1.   Referral to an Ear, Nose, and Throat Physician for further assessment of right asymmetric hearing loss and right middle ear dysfunction. Bianco requested to be referred to Dr. Deeann Saint office.  2.   Communication Needs Assessment with an Audiologist to discuss hearing aids and assistive listening devices after evaluation by an ENT.     If you have any questions please feel free to contact me at [phone].  Bari Mantis Audiologist, Au.D., CCC-A 12/03/2020  3:52 PM  Cc: [name], NP

## 2020-12-04 ENCOUNTER — Other Ambulatory Visit: Payer: Self-pay | Admitting: Nurse Practitioner

## 2020-12-04 DIAGNOSIS — H903 Sensorineural hearing loss, bilateral: Secondary | ICD-10-CM

## 2020-12-11 ENCOUNTER — Ambulatory Visit: Payer: Medicare PPO | Admitting: Pulmonary Disease

## 2020-12-16 ENCOUNTER — Other Ambulatory Visit: Payer: Self-pay

## 2020-12-16 ENCOUNTER — Ambulatory Visit: Payer: Medicare PPO | Admitting: Pulmonary Disease

## 2020-12-16 ENCOUNTER — Encounter: Payer: Self-pay | Admitting: Pulmonary Disease

## 2020-12-16 VITALS — BP 142/84 | HR 86 | Temp 98.1°F | Ht 66.0 in | Wt 221.0 lb

## 2020-12-16 DIAGNOSIS — G4733 Obstructive sleep apnea (adult) (pediatric): Secondary | ICD-10-CM | POA: Diagnosis not present

## 2020-12-16 DIAGNOSIS — J449 Chronic obstructive pulmonary disease, unspecified: Secondary | ICD-10-CM

## 2020-12-16 DIAGNOSIS — J432 Centrilobular emphysema: Secondary | ICD-10-CM | POA: Diagnosis not present

## 2020-12-16 DIAGNOSIS — J4540 Moderate persistent asthma, uncomplicated: Secondary | ICD-10-CM

## 2020-12-16 MED ORDER — TRELEGY ELLIPTA 100-62.5-25 MCG/ACT IN AEPB: 1.0000 | INHALATION_SPRAY | Freq: Every day | RESPIRATORY_TRACT | 5 refills | Status: DC

## 2020-12-16 MED ORDER — IPRATROPIUM BROMIDE 0.03 % NA SOLN: 2.0000 | Freq: Three times a day (TID) | NASAL | 12 refills | Status: DC | PRN

## 2020-12-16 MED ORDER — GUAIFENESIN ER 600 MG PO TB12: 1200.0000 mg | ORAL_TABLET | Freq: Two times a day (BID) | ORAL | Status: DC | PRN

## 2020-12-16 MED ORDER — MONTELUKAST SODIUM 10 MG PO TABS: 10.0000 mg | ORAL_TABLET | Freq: Every day | ORAL | 5 refills | Status: DC

## 2020-12-16 NOTE — Patient Instructions (Signed)
Mucinex 1200 mg twice per day as needed to help with cough and loosen phlegm  Atrovent nasal spray every 8 hours as needed for runny nose  Follow up in 3 months

## 2020-12-16 NOTE — Progress Notes (Signed)
[facility]  Chief Complaint  Patient presents with   Follow-up    OSA not using Emphysema     Past Surgical History:  She  has a past surgical history that includes Rotator cuff repair (Left, 2008); Renal biopsy; Medial partial knee replacement (Bilateral); Abdominal hysterectomy; Bilateral salpingoophorectomy; Mohs surgery; Appendectomy; Conversion to total knee (Right, 07/10/2014); IR Angiogram Selective Each Additional Vessel (02/08/2019); IR INFUSION THROMBOL ARTERIAL INITIAL (MS) (02/08/2019); IR INFUSION THROMBOL ARTERIAL INITIAL (MS) (02/08/2019); IR Angiogram Pulmonary Bilateral Selective (02/08/2019); IR US Guide Vasc Access Right (02/08/2019); IR Angiogram Selective Each Additional Vessel (02/08/2019); IR THROMB F/U EVAL ART/VEN FINAL DAY (MS) (02/09/2019); Replacement total knee (2012); and Colonoscopy (2001).  Past Medical History:  OA, COPD, GERD, HLD, HTN, Hypothyroidism, Nephrotic syndrome, Osteopenia  Constitutional:  BP (!) 142/84 (BP Location: Left Arm, Cuff Size: Large)   Pulse 86   Temp 98.1 F (36.7 C) (Oral)   Ht 5\' 6"  (1.676 m)   Wt 221 lb (100.2 kg)   SpO2 96%   BMI 35.67 kg/m   Brief Summary:  [name] is a 80 y.o. female former smoker with obstructive sleep apnea, asthma, emphysema, pulmonary embolism, and dyspnea on exertion.      Subjective:   She is here with her daughter.  She has moved in with her.  She wasn't using trelegy.  Ran out of script and didn't get refilled until a couple weeks ago.  Cough and chest congestion were bad, but some better now.  Has runny nose.  Was started on IV iron for anemia.  Hb was down to 8.9.  Feels this has helped.  Wasn't able to use CPAP, and stopped using.  Sleeping better without CPAP.    Physical Exam:   Appearance - well kempt   ENMT - no sinus tenderness, no oral exudate, no LAN, Mallampati 3 airway, no stridor  Respiratory - equal breath  sounds bilaterally, no wheezing or rales  CV - s1s2 regular rate and rhythm, no murmurs  Ext - no clubbing, no edema  Skin - no rashes  Psych - normal mood and affect   Pulmonary testing:  PFT 06/13/19 >> FEV1 1.45 (66%), FEV1% 81, TLC 3.85 (71%), DLCO 55%  Chest Imaging:  CT chest 07/14/19 >> mild emphysema, small hiatal hernia  Sleep Tests:  PSG 08/05/19 >> AHI 26.3, SpO2 low 85% Auto CPAP 10/11/19 to 11/02/19 >> used on 23 of 23 nights with average 9 hrs 12 min.  Average AHI 3.5 with median CPAP 8 and 95 th percentile CPAP 10 cm H2O  Cardiac Tests:  Echo 04/11/19 >> EF 65 to 70%, mild LVH, grade 1 DD  Social History:  She  reports that she quit smoking about 47 years ago. Her smoking use included cigarettes. She has a 37.50 pack-year smoking history. She has never used smokeless tobacco. She reports current alcohol use of about 0.5 standard drinks per week. She reports that she does not use drugs.  Family History:  Her family history includes COPD in her mother; Heart disease in her paternal grandfather; Hypertension in her sister; Lymphoma in her father; Rheumatic fever in her paternal grandmother; Scoliosis in her daughter; Stroke in her maternal grandfather.     Assessment/Plan:   Obstructive sleep apnea. - intolerant of CPAP   Emphysema, asthma. - continue trelegy, singulair - prn albuterol, mucinex  Rhinitis. - prn atrovent   Hx of pulmonary embolism. - indefinite therapy with eliquis  Dyspnea on exertion. - reassess after she completes iron therapy for anemia - might benefit from pulmonary rehab at some point  CKD 3a, Minimal change disease. - followed by Nephrology at [facility]  Time Spent Involved in Patient Care on Day of Examination:  32 minutes  Follow up:   Patient Instructions  Mucinex 1200 mg twice per day as needed to help with cough and loosen phlegm  Atrovent nasal spray every 8 hours as needed for runny nose  Follow up in 3  months  Medication List:   Allergies as of 12/16/2020   No Known Allergies      Medication List        Accurate as of December 16, 2020  3:58 PM. If you have any questions, ask your nurse or doctor.          STOP taking these medications    omeprazole 40 MG capsule Commonly known as: PRILOSEC Stopped by: Chesley Mires, MD       TAKE these medications    albuterol 108 (90 Base) MCG/ACT inhaler Commonly known as: VENTOLIN HFA Inhale 1 puff into the lungs as needed for wheezing or shortness of breath.   Calcium-Vitamin D 600-400 MG-UNIT Tabs Take 1 tablet by mouth daily.   cholecalciferol 1000 units tablet Commonly known as: VITAMIN D Take 1,000 Units by mouth 2 (two) times daily.   citalopram 20 MG tablet Commonly known as: CELEXA Take 1 tablet (20 mg total) by mouth at bedtime.   Eliquis 5 MG Tabs tablet Generic drug: apixaban TAKE 1 TABLET BY MOUTH TWICE A DAY   ferrous sulfate 325 (65 FE) MG EC tablet Take 325 mg by mouth in the morning and at bedtime.   guaiFENesin 600 MG 12 hr tablet Commonly known as: Mucinex Take 2 tablets (1,200 mg total) by mouth 2 (two) times daily as needed for to loosen phlegm or cough. Started by: Chesley Mires, MD   ipratropium 0.03 % nasal spray Commonly known as: ATROVENT Place 2 sprays into both nostrils 3 (three) times daily as needed for rhinitis. Started by: Chesley Mires, MD   levothyroxine 88 MCG tablet Commonly known as: SYNTHROID Take 1 tablet (88 mcg total) by mouth every morning.   metFORMIN 500 MG tablet Commonly known as: GLUCOPHAGE Take 1 tablet (500 mg total) by mouth 2 (two) times daily with a meal.   montelukast 10 MG tablet Commonly known as: SINGULAIR Take 1 tablet (10 mg total) by mouth at bedtime.   multivitamin with minerals Tabs tablet Take 1 tablet by mouth 2 (two) times daily.   rOPINIRole 1 MG tablet Commonly known as: REQUIP Take 1 tablet (1 mg total) by mouth at bedtime.   rosuvastatin  10 MG tablet Commonly known as: Crestor Take 1 tablet (10 mg total) by mouth daily.   tacrolimus 1 MG capsule Commonly known as: PROGRAF Take 1 mg by mouth 2 (two) times daily.   Trelegy Ellipta 100-62.5-25 MCG/ACT Aepb Generic drug: Fluticasone-Umeclidin-Vilant Inhale 1 puff into the lungs daily.        Signature:  Chesley Mires, MD [facility] Pager - [phone] 12/16/2020, 3:58 PM

## 2021-01-06 ENCOUNTER — Telehealth: Payer: Self-pay

## 2021-01-06 NOTE — Telephone Encounter (Signed)
Message left on clinical intake voicemail:   Patient called stating she has been off her blood pressure medication x a few weeks and not sure why. Patient states if she is to resume a b/p medication she needs a rx sent to the pharmacy

## 2021-01-06 NOTE — Telephone Encounter (Signed)
I called patient to question the name of B/P medication she is taken as we do not have on on her medication list. Patient states she has no idea, it would be whatever she told us at her initial visit.  I reviewed patients first visit and [place], Carlos American, NP documented patient had taken losartan in the past (yet no dose was recorded nor was it added to medication list).  I asked patient if she had some B/P readings and she states no, patient does not take b/p at home.   I called [facility] to confirm medication and dose, per the pharmacist patient is on Losartan 100 mg daily

## 2021-01-06 NOTE — Telephone Encounter (Signed)
Yes I would recommend she restart since she was never advised to stop. Okay to send in refill to her pharmacy

## 2021-01-07 MED ORDER — LOSARTAN POTASSIUM 100 MG PO TABS: 100.0000 mg | ORAL_TABLET | Freq: Every day | ORAL | 1 refills | Status: DC

## 2021-01-07 NOTE — Telephone Encounter (Signed)
Left message on voicemail informing patient of Jessica's response. RX sent.

## 2021-01-08 ENCOUNTER — Telehealth: Payer: Self-pay | Admitting: *Deleted

## 2021-01-08 NOTE — Telephone Encounter (Signed)
A sticky note was left on my desk stating: "[name] (daughter) [phone] wants CMA to call her to discuss her mother [name] May 05, 1940"   Tried calling daughter and [facility] to return call.  Awaiting callback.

## 2021-01-09 NOTE — Telephone Encounter (Signed)
I am more than happy to see them sooner, or can get in with another provider. We can do an MMSE (memory screening) at that appt and discuss any concerns.

## 2021-01-09 NOTE — Telephone Encounter (Signed)
Appointment scheduled for 01/13/21 with Janett Billow

## 2021-01-09 NOTE — Telephone Encounter (Signed)
Sold house and now living with daughter.   Daughter stated that now she is living with her there are certain things that don't make since.   Patient always been on blood pressure medications and all of a sudden its being overlooked and she has not been taking for a month. Daughter had this corrected.   In her mind things are happening that didn't happen.   Also stated that she is not happy with her mother's Lung Dr. Lenon Ahmadi that he placed her on Mucinex and it seems like patient's cough has worsened.  Daughter is wondering if patient needs a sooner appointment than 03/10/2021 to have her memory tested. Stated that she wanted your advise before scheduling an appointment.   Please Advise.

## 2021-01-12 DIAGNOSIS — D580 Hereditary spherocytosis: Secondary | ICD-10-CM | POA: Diagnosis not present

## 2021-01-13 ENCOUNTER — Encounter: Payer: Self-pay | Admitting: Nurse Practitioner

## 2021-01-13 ENCOUNTER — Ambulatory Visit: Payer: Medicare PPO | Admitting: Nurse Practitioner

## 2021-01-13 ENCOUNTER — Other Ambulatory Visit: Payer: Self-pay

## 2021-01-13 VITALS — BP 136/78 | HR 84 | Temp 97.5°F | Ht 66.5 in | Wt 216.0 lb

## 2021-01-13 DIAGNOSIS — R1310 Dysphagia, unspecified: Secondary | ICD-10-CM

## 2021-01-13 DIAGNOSIS — R0982 Postnasal drip: Secondary | ICD-10-CM | POA: Diagnosis not present

## 2021-01-13 DIAGNOSIS — R053 Chronic cough: Secondary | ICD-10-CM | POA: Diagnosis not present

## 2021-01-13 DIAGNOSIS — G3184 Mild cognitive impairment, so stated: Secondary | ICD-10-CM

## 2021-01-13 MED ORDER — DONEPEZIL HCL 5 MG PO TABS: ORAL_TABLET | ORAL | 1 refills | Status: DC

## 2021-01-13 NOTE — Progress Notes (Signed)
Careteam: Patient Care Team: [name], NP as PCP - General (Geriatric Medicine) Paralee Cancel, MD as Consulting Physician (Orthopedic Surgery) Jari Pigg, MD as Consulting Physician (Dermatology) Biagio Quint, MD as Referring Physician (Nephrology) Chesley Mires, MD as Consulting Physician (Pulmonary Disease)  PLACE OF SERVICE:  [facility] Directive information    No Known Allergies  Chief Complaint  Patient presents with   Acute Visit    Memory concerns and ongoing cough (possibly related to B/P medication). MMSE 27/30, passed clock drawing. Discuss need for eye exam and covid vaccine. Flu vaccine and foot exam today.     HPI: Patient is a 80 y.o. female due to memory concerns.  She has had a cough and it is worse now that she restarted losartan. She reports she has a lot of post nasal drip.  She was taking mucinex and that made the cough worse.  She was placed on atrovent but has not noticed any difference.   Reports sometimes she gets choked with eating. Daughter reports she has noticed some trouble swallowing. She will cough after eating and sometimes caused her to regurgitate her food.   She was off losartan for several month and cough was persistent   Reports some signs of forgetfulness from daughter. MMSE is 27/30.  She admits she is inattentive  She does have issues with hearing loss.   She was having constipation being on iron. No blood in stool.  No abnormal bruising or bleeding.   She is very sedentary.   Review of Systems:  Review of Systems  Respiratory:  Positive for cough and sputum production. Negative for shortness of breath and wheezing.   Psychiatric/Behavioral:  Positive for memory loss. Negative for depression. The patient does not have insomnia.    Past Medical History:  Diagnosis Date   Actinic keratosis    Arthritis    Asthma    Back injury 2019   Fracture   Basal cell carcinoma    COPD (chronic obstructive  pulmonary disease) (HCC)    GERD (gastroesophageal reflux disease)    H/O blood clots 2020   Lung, Per [facility] New Patient Packet   H/O mammogram 2022   Per [facility] New Patient Packet   Heart murmur    hx of in childhood    High cholesterol    Hyperlipidemia    Hypertension    Hypothyroidism    Kidney disease    Per [facility] New Patient Packet   MCNS (minimal change nephrotic syndrome)    OSA (obstructive sleep apnea) 08/29/2019   Osteopenia    Shortness of breath dyspnea    on exertion    Squamous cell carcinoma    Thyroid disease    Per [facility] New Patient Packet   Past Surgical History:  Procedure Laterality Date   ABDOMINAL HYSTERECTOMY     APPENDECTOMY     BILATERAL SALPINGOOPHORECTOMY     Ovarian Cysts    COLONOSCOPY  2001   Dr.Mann, Per [facility] New Patient Packet   CONVERSION TO TOTAL KNEE Right 07/10/2014   Procedure: RIGHT CONVERSION OF PARTIAL KNEE TO TOTAL KNEE;  Surgeon: Paralee Cancel, MD;  Location: [facility] ORS;  Service: Orthopedics;  Laterality: Right;   IR ANGIOGRAM PULMONARY BILATERAL SELECTIVE  02/08/2019   IR ANGIOGRAM SELECTIVE EACH ADDITIONAL VESSEL  02/08/2019   IR ANGIOGRAM SELECTIVE EACH ADDITIONAL VESSEL  02/08/2019   IR INFUSION THROMBOL ARTERIAL INITIAL (MS)  02/08/2019   IR INFUSION THROMBOL ARTERIAL INITIAL (MS)  02/08/2019   IR THROMB F/U EVAL ART/VEN FINAL DAY (MS)  02/09/2019   IR US GUIDE VASC ACCESS RIGHT  02/08/2019   MEDIAL PARTIAL KNEE REPLACEMENT Bilateral    MOHS SURGERY     x 2   RENAL BIOPSY     x 2   REPLACEMENT TOTAL KNEE  2012   Per [facility] New Patient Packet   ROTATOR CUFF REPAIR Left 2008   Social History:   reports that she quit smoking about 48 years ago. Her smoking use included cigarettes. She has a 37.50 pack-year smoking history. She has never used smokeless tobacco. She reports current alcohol use of about 0.5 standard drinks per week. She reports that she does not use drugs.  Family History  Problem Relation Age of Onset   COPD Mother         Husband Smoked    Lymphoma Father    Hypertension Sister    Scoliosis Daughter    Stroke Maternal Grandfather    Rheumatic fever Paternal Grandmother    Heart disease Paternal Grandfather     Medications: Patient's Medications  New Prescriptions   No medications on file  Previous Medications   ALBUTEROL (VENTOLIN HFA) 108 (90 BASE) MCG/ACT INHALER    Inhale 1 puff into the lungs as needed for wheezing or shortness of breath.   CALCIUM CARB-CHOLECALCIFEROL (CALCIUM-VITAMIN D) 600-400 MG-UNIT TABS    Take 1 tablet by mouth daily.   CHOLECALCIFEROL (VITAMIN D) 1000 UNITS TABLET    Take 1,000 Units by mouth 2 (two) times daily.   CITALOPRAM (CELEXA) 20 MG TABLET    Take 1 tablet (20 mg total) by mouth at bedtime.   ELIQUIS 5 MG TABS TABLET    TAKE 1 TABLET BY MOUTH TWICE A DAY   FERROUS SULFATE 325 (65 FE) MG EC TABLET    Take 325 mg by mouth in the morning and at bedtime.   FLUTICASONE-UMECLIDIN-VILANT (TRELEGY ELLIPTA) 100-62.5-25 MCG/ACT AEPB    Inhale 1 puff into the lungs daily.   IPRATROPIUM (ATROVENT) 0.03 % NASAL SPRAY    Place 2 sprays into both nostrils 3 (three) times daily as needed for rhinitis.   LEVOTHYROXINE (SYNTHROID) 88 MCG TABLET    Take 1 tablet (88 mcg total) by mouth every morning.   LOSARTAN (COZAAR) 100 MG TABLET    Take 1 tablet (100 mg total) by mouth daily.   METFORMIN (GLUCOPHAGE) 500 MG TABLET    Take 1 tablet (500 mg total) by mouth 2 (two) times daily with a meal.   MONTELUKAST (SINGULAIR) 10 MG TABLET    Take 1 tablet (10 mg total) by mouth at bedtime.   MULTIPLE VITAMIN (MULTIVITAMIN WITH MINERALS) TABS TABLET    Take 1 tablet by mouth 2 (two) times daily.   ROPINIROLE (REQUIP) 1 MG TABLET    Take 1 tablet (1 mg total) by mouth at bedtime.   ROSUVASTATIN (CRESTOR) 10 MG TABLET    Take 1 tablet (10 mg total) by mouth daily.   TACROLIMUS (PROGRAF) 1 MG CAPSULE    Take 1 mg by mouth 2 (two) times daily.   Modified Medications   No medications on file   Discontinued Medications   GUAIFENESIN (MUCINEX) 600 MG 12 HR TABLET    Take 2 tablets (1,200 mg total) by mouth 2 (two) times daily as needed for to loosen phlegm or cough.    Physical Exam:  Vitals:   01/13/21 1112  BP: 136/78  Pulse: 84  Temp: Marland Kitchen)  97.5 F (36.4 C)  TempSrc: Temporal  SpO2: 95%  Weight: 216 lb (98 kg)  Height: 5' 6.5" (1.689 m)   Body mass index is 34.34 kg/m. Wt Readings from Last 3 Encounters:  01/13/21 216 lb (98 kg)  12/16/20 221 lb (100.2 kg)  12/02/20 221 lb (100.2 kg)    Physical Exam Constitutional:      General: She is not in acute distress.    Appearance: She is well-developed. She is not diaphoretic.  HENT:     Head: Normocephalic and atraumatic.     Mouth/Throat:     Pharynx: No oropharyngeal exudate.  Eyes:     Conjunctiva/sclera: Conjunctivae normal.     Pupils: Pupils are equal, round, and reactive to light.  Cardiovascular:     Rate and Rhythm: Normal rate and regular rhythm.     Heart sounds: Normal heart sounds.  Pulmonary:     Effort: Pulmonary effort is normal.     Breath sounds: Normal breath sounds.  Abdominal:     General: Bowel sounds are normal.     Palpations: Abdomen is soft.  Musculoskeletal:     Cervical back: Normal range of motion and neck supple.     Right lower leg: No edema.     Left lower leg: No edema.  Skin:    General: Skin is warm and dry.  Neurological:     Mental Status: She is alert and oriented to person, place, and time.  Psychiatric:        Mood and Affect: Mood normal.    Labs reviewed: Basic Metabolic Panel: Recent Labs    11/27/20 0942  NA 141  K 4.2  CL 102  CO2 31  GLUCOSE 123*  BUN 17  CREATININE 0.96*  CALCIUM 9.3  TSH 1.99   Liver Function Tests: Recent Labs    11/27/20 0942  AST 12  ALT 10  BILITOT 0.3  PROT 6.9   No results for input(s): LIPASE, AMYLASE in the last 8760 hours. No results for input(s): AMMONIA in the last 8760 hours. CBC: Recent Labs     11/27/20 0942  WBC 7.4  NEUTROABS 5,062  HGB 10.4*  HCT 35.6  MCV 76.9*  PLT 353   Lipid Panel: Recent Labs    11/27/20 0942  CHOL 112  HDL 56  LDLCALC 36  TRIG 122  CHOLHDL 2.0   TSH: Recent Labs    11/27/20 0942  TSH 1.99   A1C: Lab Results  Component Value Date   HGBA1C 6.7 (H) 11/27/2020     Assessment/Plan 1. MCI (mild cognitive impairment) -MMSE 27/30 -encouraged to stay active, physical and mentally.  -will start aricept at this time.  - donepezil (ARICEPT) 5 MG tablet; To take 1/2 tablet daily at bedtime for 4 weeks then increase to 1 tablet at bedtime.  Dispense: 30 tablet; Refill: 1  2. Post-nasal drip -will have her add claritin or zyrtec daily to help with increase post-nasal drip.  - SLP modified barium swallow; Future  3. Dysphagia, unspecified type -reports at times she will get choked on her foods and causes her to cough to the point of regurgitation. Will get swallow evaluation at this time.  - SLP modified barium swallow; Future  4. Chronic cough - could be multi- factorial, considering aspiration, vs COPD or post-nasal drip.  -also when she is having a lot of coughing could be related to wheezing with COPD, encouraged to use albuterol to see if this helps with symptoms.  -  SLP modified barium swallow; Future   Next appt: 03/03/2021 for routine follow up.  Carlos American. [place], [facility] [phone]

## 2021-01-13 NOTE — Patient Instructions (Addendum)
Start zyrtec 10 mg by mouth daily or claritin 10 mg by mouth daily   Start aricept 5 mg (1/2 tablet)by mouth at bedtime, after 1 month increase to 10 mg  (whole tablet)

## 2021-01-14 ENCOUNTER — Other Ambulatory Visit (HOSPITAL_COMMUNITY): Payer: Self-pay | Admitting: *Deleted

## 2021-01-14 DIAGNOSIS — R1310 Dysphagia, unspecified: Secondary | ICD-10-CM

## 2021-01-15 LAB — FECAL GLOBIN BY IMMUNOCHEMISTRY
FECAL GLOBIN RESULT:: NOT DETECTED
MICRO NUMBER:: 12720230
SPECIMEN QUALITY:: ADEQUATE

## 2021-01-23 ENCOUNTER — Ambulatory Visit (HOSPITAL_COMMUNITY)
Admission: RE | Admit: 2021-01-23 | Discharge: 2021-01-23 | Disposition: A | Discharge status: Home / Self Care | Payer: Medicare PPO | Source: Ambulatory Visit | Attending: Nurse Practitioner | Admitting: Nurse Practitioner

## 2021-01-23 ENCOUNTER — Other Ambulatory Visit: Payer: Self-pay

## 2021-01-23 DIAGNOSIS — R053 Chronic cough: Secondary | ICD-10-CM | POA: Insufficient documentation

## 2021-01-23 DIAGNOSIS — D84821 Immunodeficiency due to drugs: Secondary | ICD-10-CM | POA: Diagnosis not present

## 2021-01-23 DIAGNOSIS — N1831 Chronic kidney disease, stage 3a: Secondary | ICD-10-CM | POA: Diagnosis not present

## 2021-01-23 DIAGNOSIS — N050 Unspecified nephritic syndrome with minor glomerular abnormality: Secondary | ICD-10-CM | POA: Diagnosis not present

## 2021-01-23 DIAGNOSIS — R1310 Dysphagia, unspecified: Secondary | ICD-10-CM | POA: Insufficient documentation

## 2021-01-23 DIAGNOSIS — Z86711 Personal history of pulmonary embolism: Secondary | ICD-10-CM | POA: Diagnosis not present

## 2021-01-23 DIAGNOSIS — Z7984 Long term (current) use of oral hypoglycemic drugs: Secondary | ICD-10-CM | POA: Diagnosis not present

## 2021-01-23 DIAGNOSIS — R0982 Postnasal drip: Secondary | ICD-10-CM

## 2021-01-23 DIAGNOSIS — Z79621 Long term (current) use of calcineurin inhibitor: Secondary | ICD-10-CM | POA: Diagnosis not present

## 2021-01-23 DIAGNOSIS — I129 Hypertensive chronic kidney disease with stage 1 through stage 4 chronic kidney disease, or unspecified chronic kidney disease: Secondary | ICD-10-CM | POA: Diagnosis not present

## 2021-01-23 DIAGNOSIS — E1122 Type 2 diabetes mellitus with diabetic chronic kidney disease: Secondary | ICD-10-CM | POA: Diagnosis not present

## 2021-01-23 DIAGNOSIS — Z7901 Long term (current) use of anticoagulants: Secondary | ICD-10-CM | POA: Diagnosis not present

## 2021-01-23 DIAGNOSIS — D509 Iron deficiency anemia, unspecified: Secondary | ICD-10-CM | POA: Diagnosis not present

## 2021-02-07 ENCOUNTER — Other Ambulatory Visit: Payer: Self-pay | Admitting: Nurse Practitioner

## 2021-02-07 DIAGNOSIS — H9071 Mixed conductive and sensorineural hearing loss, unilateral, right ear, with unrestricted hearing on the contralateral side: Secondary | ICD-10-CM | POA: Diagnosis not present

## 2021-02-07 DIAGNOSIS — G3184 Mild cognitive impairment, so stated: Secondary | ICD-10-CM

## 2021-02-07 DIAGNOSIS — H6123 Impacted cerumen, bilateral: Secondary | ICD-10-CM | POA: Diagnosis not present

## 2021-02-17 ENCOUNTER — Other Ambulatory Visit: Payer: Self-pay | Admitting: Otolaryngology

## 2021-02-17 DIAGNOSIS — H918X9 Other specified hearing loss, unspecified ear: Secondary | ICD-10-CM

## 2021-02-25 DIAGNOSIS — C44529 Squamous cell carcinoma of skin of other part of trunk: Secondary | ICD-10-CM | POA: Diagnosis not present

## 2021-02-25 DIAGNOSIS — L988 Other specified disorders of the skin and subcutaneous tissue: Secondary | ICD-10-CM | POA: Diagnosis not present

## 2021-03-03 ENCOUNTER — Other Ambulatory Visit: Payer: Medicare PPO

## 2021-03-03 ENCOUNTER — Other Ambulatory Visit: Payer: Self-pay

## 2021-03-03 DIAGNOSIS — E782 Mixed hyperlipidemia: Secondary | ICD-10-CM

## 2021-03-03 DIAGNOSIS — E119 Type 2 diabetes mellitus without complications: Secondary | ICD-10-CM

## 2021-03-03 DIAGNOSIS — I10 Essential (primary) hypertension: Secondary | ICD-10-CM

## 2021-03-03 DIAGNOSIS — D508 Other iron deficiency anemias: Secondary | ICD-10-CM | POA: Diagnosis not present

## 2021-03-04 LAB — IRON,TIBC AND FERRITIN PANEL
%SAT: 9 % (calc) — ABNORMAL LOW (ref 16–45)
Ferritin: 15 ng/mL — ABNORMAL LOW (ref 16–288)
Iron: 33 ug/dL — ABNORMAL LOW (ref 45–160)
TIBC: 371 mcg/dL (calc) (ref 250–450)

## 2021-03-04 LAB — CBC WITH DIFFERENTIAL/PLATELET
Absolute Monocytes: 1193 cells/uL — ABNORMAL HIGH (ref 200–950)
Basophils Absolute: 71 cells/uL (ref 0–200)
Basophils Relative: 0.7 %
Eosinophils Absolute: 173 cells/uL (ref 15–500)
Eosinophils Relative: 1.7 %
HCT: 43.9 % (ref 35.0–45.0)
Hemoglobin: 13.2 g/dL (ref 11.7–15.5)
Lymphs Abs: 2060 cells/uL (ref 850–3900)
MCH: 24.9 pg — ABNORMAL LOW (ref 27.0–33.0)
MCHC: 30.1 g/dL — ABNORMAL LOW (ref 32.0–36.0)
MCV: 82.8 fL (ref 80.0–100.0)
MPV: 10.7 fL (ref 7.5–12.5)
Monocytes Relative: 11.7 %
Neutro Abs: 6701 cells/uL (ref 1500–7800)
Neutrophils Relative %: 65.7 %
Platelets: 306 10*3/uL (ref 140–400)
RBC: 5.3 10*6/uL — ABNORMAL HIGH (ref 3.80–5.10)
RDW: 19.9 % — ABNORMAL HIGH (ref 11.0–15.0)
Total Lymphocyte: 20.2 %
WBC: 10.2 10*3/uL (ref 3.8–10.8)

## 2021-03-04 LAB — COMPLETE METABOLIC PANEL WITH GFR
AG Ratio: 1.4 (calc) (ref 1.0–2.5)
ALT: 11 U/L (ref 6–29)
AST: 12 U/L (ref 10–35)
Albumin: 4.2 g/dL (ref 3.6–5.1)
Alkaline phosphatase (APISO): 57 U/L (ref 37–153)
BUN/Creatinine Ratio: 19 (calc) (ref 6–22)
BUN: 19 mg/dL (ref 7–25)
CO2: 31 mmol/L (ref 20–32)
Calcium: 9.5 mg/dL (ref 8.6–10.4)
Chloride: 103 mmol/L (ref 98–110)
Creat: 1.02 mg/dL — ABNORMAL HIGH (ref 0.60–0.95)
Globulin: 2.9 g/dL (calc) (ref 1.9–3.7)
Glucose, Bld: 113 mg/dL — ABNORMAL HIGH (ref 65–99)
Potassium: 4.2 mmol/L (ref 3.5–5.3)
Sodium: 141 mmol/L (ref 135–146)
Total Bilirubin: 0.3 mg/dL (ref 0.2–1.2)
Total Protein: 7.1 g/dL (ref 6.1–8.1)
eGFR: 55 mL/min/{1.73_m2} — ABNORMAL LOW (ref >=60)

## 2021-03-04 LAB — LIPID PANEL
Cholesterol: 121 mg/dL (ref <200)
HDL: 60 mg/dL (ref >=50)
LDL Cholesterol (Calc): 39 mg/dL (calc)
Non-HDL Cholesterol (Calc): 61 mg/dL (calc) (ref <130)
Total CHOL/HDL Ratio: 2 (calc) (ref <5.0)
Triglycerides: 139 mg/dL (ref <150)

## 2021-03-04 LAB — HEMOGLOBIN A1C
Hgb A1c MFr Bld: 6.2 % of total Hgb — ABNORMAL HIGH (ref <5.7)
Mean Plasma Glucose: 131 mg/dL
eAG (mmol/L): 7.3 mmol/L

## 2021-03-10 ENCOUNTER — Encounter: Payer: Self-pay | Admitting: Nurse Practitioner

## 2021-03-10 ENCOUNTER — Ambulatory Visit: Payer: Medicare PPO | Admitting: Nurse Practitioner

## 2021-03-10 ENCOUNTER — Other Ambulatory Visit: Payer: Self-pay

## 2021-03-10 VITALS — BP 128/76 | HR 76 | Temp 97.5°F | Ht 67.0 in | Wt 216.0 lb

## 2021-03-10 DIAGNOSIS — R0982 Postnasal drip: Secondary | ICD-10-CM

## 2021-03-10 DIAGNOSIS — D508 Other iron deficiency anemias: Secondary | ICD-10-CM

## 2021-03-10 DIAGNOSIS — N1832 Chronic kidney disease, stage 3b: Secondary | ICD-10-CM

## 2021-03-10 DIAGNOSIS — K219 Gastro-esophageal reflux disease without esophagitis: Secondary | ICD-10-CM

## 2021-03-10 DIAGNOSIS — H903 Sensorineural hearing loss, bilateral: Secondary | ICD-10-CM

## 2021-03-10 DIAGNOSIS — E039 Hypothyroidism, unspecified: Secondary | ICD-10-CM | POA: Diagnosis not present

## 2021-03-10 DIAGNOSIS — I10 Essential (primary) hypertension: Secondary | ICD-10-CM | POA: Diagnosis not present

## 2021-03-10 DIAGNOSIS — E119 Type 2 diabetes mellitus without complications: Secondary | ICD-10-CM

## 2021-03-10 DIAGNOSIS — G2581 Restless legs syndrome: Secondary | ICD-10-CM | POA: Diagnosis not present

## 2021-03-10 DIAGNOSIS — R053 Chronic cough: Secondary | ICD-10-CM

## 2021-03-10 DIAGNOSIS — E1122 Type 2 diabetes mellitus with diabetic chronic kidney disease: Secondary | ICD-10-CM | POA: Diagnosis not present

## 2021-03-10 MED ORDER — PANTOPRAZOLE SODIUM 40 MG PO TBEC: 40.0000 mg | DELAYED_RELEASE_TABLET | Freq: Every day | ORAL | 3 refills | Status: DC

## 2021-03-10 NOTE — Patient Instructions (Signed)
Stop omeprazole  Start protonix 40 mg by mouth once daily

## 2021-03-10 NOTE — Progress Notes (Signed)
Careteam: [name] Care Team: [name], NP as PCP - General (Geriatric Medicine) Paralee Cancel, MD as Consulting Physician (Orthopedic Surgery) Jari Pigg, MD as Consulting Physician (Dermatology) Biagio Quint, MD as Referring Physician (Nephrology) Chesley Mires, MD as Consulting Physician (Pulmonary Disease)  PLACE OF SERVICE:  [facility] Directive information Does [name] Have a Medical Advance Directive?: Yes, Type of Advance Directive: Living will;Healthcare Power of Attorney, Does [name] want to make changes to medical advance directive?: No - [name] declined  No Known Allergies  Chief Complaint  [name] presents with   Medical Management of Chronic Issues    3 month follow-up and discuss labs (copy printed and given to [name]). [name] denies receiving any vaccines since last visit. Discuss need for coivd boosters, td/tdap, and eye exam. Discuss Omeprazole. Foot exam today. Here with [name]      HPI: [name] is a 81 y.o. female for routine follow up.   Due for COVID and Tdap vaccine, plan to get at local pharmacy.   [name]- she is tolerating 5 mg daily, will start 10 mg daily in 1 week, no adverse effects noted.   She had her swallow evaluation which went well. No abnormalities noted. She did an episodes of choking last week.   Reports she has phelm in her throat and then feels choked. 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 Ordered an MRI to further evaluated.  She has MRI this Sunday.   On iron for iron def- hgb has improved  Continues to have diarrhea (was constipation on iron)   Hyperlipidemia continues on crestor, LDL at goal.   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  She was taken off VIT D     Review of Systems:  Review of Systems  Constitutional:  Negative for chills, fever and weight Montgomery.  HENT:   Negative for tinnitus.   Respiratory:  Negative for cough, sputum production and shortness of breath.   Cardiovascular:  Negative for chest pain, palpitations and leg swelling.  Gastrointestinal:  Negative for abdominal pain, constipation, diarrhea and heartburn.  Genitourinary:  Negative for dysuria, frequency and urgency.  Musculoskeletal:  Negative for back pain, falls, joint pain and myalgias.  Skin: Negative.   Neurological:  Negative for dizziness and headaches.  Psychiatric/Behavioral:  Negative for depression and [name]. The [name] does not have insomnia.    Past Medical History:  Diagnosis Date   Actinic keratosis    Arthritis    Asthma    Back injury 2019   Fracture   Basal cell carcinoma    COPD (chronic obstructive pulmonary disease) (HCC)    GERD (gastroesophageal reflux disease)    H/O blood clots 2020   Lung, Per [place] [name]   H/O mammogram 2022   Per [place] [name]   Heart murmur    hx of in childhood    High cholesterol    Hyperlipidemia    Hypertension    Hypothyroidism    Kidney disease    Per [place] [name]   MCNS (minimal change nephrotic syndrome)    OSA (obstructive sleep apnea) 08/29/2019   Osteopenia    Shortness of breath dyspnea    on exertion    Squamous cell carcinoma    Thyroid disease    Per [name]   Past  Surgical History:  Procedure Laterality Date   ABDOMINAL HYSTERECTOMY     APPENDECTOMY     BILATERAL SALPINGOOPHORECTOMY     Ovarian Cysts    COLONOSCOPY  2001   Dr.Mann, [name]   CONVERSION TO TOTAL KNEE Right 07/10/2014   Procedure: RIGHT CONVERSION OF PARTIAL KNEE TO TOTAL KNEE;  Surgeon: Paralee Cancel, MD;  Location: [facility];  Service: Orthopedics;  Laterality: Right;   IR ANGIOGRAM PULMONARY BILATERAL SELECTIVE  02/08/2019   IR ANGIOGRAM SELECTIVE EACH ADDITIONAL VESSEL  02/08/2019   IR ANGIOGRAM SELECTIVE EACH ADDITIONAL VESSEL  02/08/2019   IR INFUSION  THROMBOL ARTERIAL INITIAL (MS)  02/08/2019   IR INFUSION THROMBOL ARTERIAL INITIAL (MS)  02/08/2019   IR THROMB F/U EVAL ART/VEN FINAL DAY (MS)  02/09/2019   IR US GUIDE VASC ACCESS RIGHT  02/08/2019   MEDIAL PARTIAL KNEE REPLACEMENT Bilateral    MOHS SURGERY     x 2   RENAL BIOPSY     x 2   REPLACEMENT TOTAL KNEE  2012   Per [place] [name]   ROTATOR CUFF REPAIR Left 2008   Social History:   reports that she quit smoking about 48 years ago. Her smoking use included cigarettes. She has a 37.50 pack-year smoking history. She has never used smokeless tobacco. She reports current alcohol use of about 0.5 standard drinks per week. She reports that she does not use drugs.  Family History  Problem Relation Age of Onset   COPD Mother        Husband Smoked    Lymphoma Father    Hypertension Sister    Scoliosis Daughter    Stroke Maternal Grandfather    Rheumatic fever Paternal Grandmother    Heart disease Paternal Grandfather     Medications: [name]'s Medications  New Prescriptions   No medications on file  Previous Medications   ALBUTEROL (VENTOLIN HFA) 108 (90 BASE) MCG/ACT INHALER    Inhale 1 puff into the lungs as needed for wheezing or shortness of breath.   CALCIUM CARB-CHOLECALCIFEROL (CALCIUM-VITAMIN D) 600-400 MG-UNIT TABS    Take 1 tablet by mouth daily.   CITALOPRAM (CELEXA) 20 MG TABLET    Take 1 tablet (20 mg total) by mouth at bedtime.   DONEPEZIL (ARICEPT) 5 MG TABLET    To take 1/2 tablet daily at bedtime for 4 weeks then increase to 1 tablet at bedtime.   ELIQUIS 5 MG TABS TABLET    TAKE 1 TABLET BY MOUTH TWICE A DAY   FERROUS SULFATE 325 (65 FE) MG EC TABLET    Take 325 mg by mouth in the morning and at bedtime.   FLUTICASONE-UMECLIDIN-VILANT (TRELEGY ELLIPTA) 100-62.5-25 MCG/ACT AEPB    Inhale 1 puff into the lungs daily.   IPRATROPIUM (ATROVENT) 0.03 % NASAL SPRAY    Place 2 sprays into both nostrils 3 (three) times daily as needed for rhinitis.    LEVOTHYROXINE (SYNTHROID) 88 MCG TABLET    Take 1 tablet (88 mcg total) by mouth every morning.   LOSARTAN (COZAAR) 100 MG TABLET    Take 1 tablet (100 mg total) by mouth daily.   METFORMIN (GLUCOPHAGE) 500 MG TABLET    Take 1 tablet (500 mg total) by mouth 2 (two) times daily with a meal.   MONTELUKAST (SINGULAIR) 10 MG TABLET    Take 1 tablet (10 mg total) by mouth at bedtime.   MULTIPLE VITAMIN (MULTIVITAMIN WITH MINERALS) TABS TABLET    Take  1 tablet by mouth 2 (two) times daily.   OMEPRAZOLE (PRILOSEC) 40 MG CAPSULE    Take 40 mg by mouth 2 (two) times daily.   ROPINIROLE (REQUIP) 1 MG TABLET    Take 1 tablet (1 mg total) by mouth at bedtime.   ROSUVASTATIN (CRESTOR) 10 MG TABLET    Take 1 tablet (10 mg total) by mouth daily.   TACROLIMUS (PROGRAF) 1 MG CAPSULE    Take 1 mg by mouth 2 (two) times daily.   Modified Medications   No medications on file  Discontinued Medications   CHOLECALCIFEROL (VITAMIN D) 1000 UNITS TABLET    Take 1,000 Units by mouth 2 (two) times daily.   OMEPRAZOLE (PRILOSEC) 20 MG CAPSULE    Take 20 mg by mouth 2 (two) times daily before a meal.    Physical Exam:  Vitals:   03/10/21 1459  BP: 128/76  Pulse: 76  Temp: (!) 97.5 F (36.4 C)  TempSrc: Temporal  SpO2: 94%  Weight: 216 lb (98 kg)  Height: _0  (1.702 m)   Body mass index is 33.83 kg/m. Wt Readings from Last 3 Encounters:  03/10/21 216 lb (98 kg)  01/13/21 216 lb (98 kg)  12/16/20 221 lb (100.2 kg)    Physical Exam Constitutional:      General: She is not in acute distress.    Appearance: She is well-developed. She is not diaphoretic.  HENT:     Head: Normocephalic and atraumatic.     Mouth/Throat:     Pharynx: No oropharyngeal exudate.  Eyes:     Conjunctiva/sclera: Conjunctivae normal.     Pupils: Pupils are equal, round, and reactive to light.  Cardiovascular:     Rate and Rhythm: Normal rate and regular rhythm.     Heart sounds: Normal heart sounds.  Pulmonary:     Effort:  Pulmonary effort is normal.     Breath sounds: Normal breath sounds.  Abdominal:     General: Bowel sounds are normal.     Palpations: Abdomen is soft.  Musculoskeletal:     Cervical back: Normal range of motion and neck supple.     Right lower leg: No edema.     Left lower leg: No edema.  Skin:    General: Skin is warm and dry.  Neurological:     Mental Status: She is alert.  Psychiatric:        Mood and Affect: Mood normal.    Labs reviewed: Basic Metabolic Panel: Recent Labs    11/27/20 0942 03/03/21 0951  NA 141 141  K 4.2 4.2  CL 102 103  CO2 31 31  GLUCOSE 123* 113*  BUN 17 19  CREATININE 0.96* 1.02*  CALCIUM 9.3 9.5  TSH 1.99  --    Liver Function Tests: Recent Labs    11/27/20 0942 03/03/21 0951  AST 12 12  ALT 10 11  BILITOT 0.3 0.3  PROT 6.9 7.1   No results for input(s): LIPASE, AMYLASE in the last 8760 hours. No results for input(s): AMMONIA in the last 8760 hours. CBC: Recent Labs    11/27/20 0942 03/03/21 0951  WBC 7.4 10.2  NEUTROABS 5,062 6,701  HGB 10.4* 13.2  HCT 35.6 43.9  MCV 76.9* 82.8  PLT 353 306   Lipid Panel: Recent Labs    11/27/20 0942 03/03/21 0951  CHOL 112 121  HDL 56 60  LDLCALC 36 39  TRIG 122 139  CHOLHDL 2.0 2.0   TSH: Recent Labs  11/27/20 0942  TSH 1.99   A1C: Lab Results  Component Value Date   HGBA1C 6.2 (H) 03/03/2021     Assessment/Plan 1. Chronic cough Ongoing, likely multi-focal from allergies, postnasal drip, GERD, COPD. Will continue on supportive care - pantoprazole (PROTONIX) 40 MG tablet; Take 1 tablet (40 mg total) by mouth daily.  Dispense: 30 tablet; Refill: 3  2. Post-nasal drip -ongoing, contributing to cough  3. Acquired hypothyroidism Tsh stable, continue on synthroid 88 mcg  4. Gastroesophageal reflux disease without esophagitis -stable, no symptoms of indigestion, acid reflex at this time. Continue dietary modifications.  - pantoprazole (PROTONIX) 40 MG tablet; Take  1 tablet (40 mg total) by mouth daily.  Dispense: 30 tablet; Refill: 3  5. Diabetes mellitus with complication (HCC) -X1G at goal, encouraged to continue dietary compliance, routine foot care/monitoring and to keep up with diabetic eye exams through ophthalmology  - CMP with eGFR([facility]); Future - Hemoglobin A1c; Future  6. 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 Essential hypertension, benign -Blood pressure well controlled Continue current medications Recheck metabolic panel - CMP with eGFR([facility]); Future  8. Iron deficiency anemia secondary to inadequate dietary iron intake -improving but still with anemia, continue on  - Iron, TIBC and Ferritin Panel; Future - CBC with Differential/Platelet; Future  9. RLS (restless legs syndrome) Improved with iron, continues on requip   Return in about 4 months (around 07/08/2021) for routine follow up, labs prior to visit . Carlos American. [place], [facility] [phone]

## 2021-03-11 DIAGNOSIS — C44521 Squamous cell carcinoma of skin of breast: Secondary | ICD-10-CM | POA: Diagnosis not present

## 2021-03-11 DIAGNOSIS — D045 Carcinoma in situ of skin of trunk: Secondary | ICD-10-CM | POA: Diagnosis not present

## 2021-03-16 ENCOUNTER — Other Ambulatory Visit: Payer: Self-pay

## 2021-03-16 ENCOUNTER — Ambulatory Visit
Admission: RE | Admit: 2021-03-16 | Discharge: 2021-03-16 | Disposition: A | Discharge status: Home / Self Care | Payer: Medicare PPO | Source: Ambulatory Visit | Attending: Otolaryngology | Admitting: Otolaryngology

## 2021-03-16 DIAGNOSIS — H918X9 Other specified hearing loss, unspecified ear: Secondary | ICD-10-CM

## 2021-03-16 DIAGNOSIS — H905 Unspecified sensorineural hearing loss: Secondary | ICD-10-CM | POA: Diagnosis not present

## 2021-03-16 MED ORDER — GADOBENATE DIMEGLUMINE 529 MG/ML IV SOLN
15.0000 mL | Freq: Once | INTRAVENOUS | Status: AC | PRN
  Administered 2021-03-16: 15 mL via INTRAVENOUS

## 2021-03-21 ENCOUNTER — Ambulatory Visit: Payer: Medicare PPO | Admitting: Pulmonary Disease

## 2021-03-21 ENCOUNTER — Other Ambulatory Visit: Payer: Self-pay

## 2021-03-21 ENCOUNTER — Encounter: Payer: Self-pay | Admitting: Pulmonary Disease

## 2021-03-21 VITALS — BP 124/70 | HR 92 | Temp 98.4°F | Ht 66.5 in | Wt 216.8 lb

## 2021-03-21 DIAGNOSIS — J4540 Moderate persistent asthma, uncomplicated: Secondary | ICD-10-CM

## 2021-03-21 DIAGNOSIS — J432 Centrilobular emphysema: Secondary | ICD-10-CM

## 2021-03-21 DIAGNOSIS — J310 Chronic rhinitis: Secondary | ICD-10-CM

## 2021-03-21 MED ORDER — FLUTICASONE PROPIONATE 50 MCG/ACT NA SUSP: 1.0000 | Freq: Every day | NASAL | 2 refills | Status: DC

## 2021-03-21 NOTE — Patient Instructions (Signed)
Flonase 1 spray in each nostril nightly  Atrovent nasal spray every 8 hours as needed for runny nose  Follow up in 6 months

## 2021-03-21 NOTE — Progress Notes (Signed)
[facility]  Chief Complaint  Patient presents with   Follow-up    Emphysema, Asthma.    Past Surgical History:  She  has a past surgical history that includes Rotator cuff repair (Left, 2008); Renal biopsy; Medial partial knee replacement (Bilateral); Abdominal hysterectomy; Bilateral salpingoophorectomy; Mohs surgery; Appendectomy; Conversion to total knee (Right, 07/10/2014); IR Angiogram Selective Each Additional Vessel (02/08/2019); IR INFUSION THROMBOL ARTERIAL INITIAL (MS) (02/08/2019); IR INFUSION THROMBOL ARTERIAL INITIAL (MS) (02/08/2019); IR Angiogram Pulmonary Bilateral Selective (02/08/2019); IR US Guide Vasc Access Right (02/08/2019); IR Angiogram Selective Each Additional Vessel (02/08/2019); IR THROMB F/U EVAL ART/VEN FINAL DAY (MS) (02/09/2019); Replacement total knee (2012); and Colonoscopy (2001).  Past Medical History:  OA, COPD, GERD, HLD, HTN, Hypothyroidism, Nephrotic syndrome, Osteopenia  Constitutional:  BP 124/70 (BP Location: Left Arm, Cuff Size: Large)    Pulse 92    Temp 98.4 F (36.9 C) (Oral)    Ht 5' 6.5" (1.689 m)    Wt 216 lb 12.8 oz (98.3 kg)    SpO2 92%    BMI 34.47 kg/m   Brief Summary:  [name] is a 81 y.o. female former smoker with asthma, emphysema, pulmonary embolism, and dyspnea on exertion.  She has a history of obstructive sleep apnea, but wasn't able to tolerate CPAP therapy.      Subjective:   She is here with her daughter.    Hemoglobin from 03/03/21 up to 13.2 g/dL.  Breathing is better.  Gets sinus congestion and post nasal drip.  Drains in the back of her throat at night and then she feels like gagging.  Reflux symptoms okay.  Physical Exam:   Appearance - well kempt   ENMT - no sinus tenderness, no oral exudate, no LAN, Mallampati 3 airway, no stridor  Respiratory - equal breath sounds bilaterally, no wheezing or rales  CV - s1s2 regular rate and rhythm, no  murmurs  Ext - no clubbing, no edema  Skin - no rashes  Psych - normal mood and affect    Pulmonary testing:  PFT 06/13/19 >> FEV1 1.45 (66%), FEV1% 81, TLC 3.85 (71%), DLCO 55%  Chest Imaging:  CT chest 07/14/19 >> mild emphysema, small hiatal hernia  Sleep Tests:  PSG 08/05/19 >> AHI 26.3, SpO2 low 85% Auto CPAP 10/11/19 to 11/02/19 >> used on 23 of 23 nights with average 9 hrs 12 min.  Average AHI 3.5 with median CPAP 8 and 95 th percentile CPAP 10 cm H2O  Cardiac Tests:  Echo 04/11/19 >> EF 65 to 70%, mild LVH, grade 1 DD  Social History:  She  reports that she quit smoking about 48 years ago. Her smoking use included cigarettes. She has a 37.50 pack-year smoking history. She has never used smokeless tobacco. She reports current alcohol use of about 0.5 standard drinks per week. She reports that she does not use drugs.  Family History:  Her family history includes COPD in her mother; Heart disease in her paternal grandfather; Hypertension in her sister; Lymphoma in her father; Rheumatic fever in her paternal grandmother; Scoliosis in her daughter; Stroke in her maternal grandfather.     Assessment/Plan:   Emphysema, asthma. - continue trelegy 100 one puff daily, singulair 10 mg nightly - prn albuterol, mucinex  Chronic rhinitis. - add flonase  - prn atrovent   Hx of pulmonary embolism. - indefinite therapy with eliquis  CKD 3a, Minimal change disease. - followed by Nephrology at [facility]  Time  Spent Involved in Patient Care on Day of Examination:  26 minutes  Follow up:   Patient Instructions  Flonase 1 spray in each nostril nightly  Atrovent nasal spray every 8 hours as needed for runny nose  Follow up in 6 months  Medication List:   Allergies as of 03/21/2021   No Known Allergies      Medication List        Accurate as of March 21, 2021  2:51 PM. If you have any questions, ask your nurse or doctor.          albuterol 108 (90 Base) MCG/ACT  inhaler Commonly known as: VENTOLIN HFA Inhale 1 puff into the lungs as needed for wheezing or shortness of breath.   Calcium-Vitamin D 600-400 MG-UNIT Tabs Take 1 tablet by mouth daily.   citalopram 20 MG tablet Commonly known as: CELEXA Take 1 tablet (20 mg total) by mouth at bedtime.   donepezil 5 MG tablet Commonly known as: ARICEPT To take 1/2 tablet daily at bedtime for 4 weeks then increase to 1 tablet at bedtime.   Eliquis 5 MG Tabs tablet Generic drug: apixaban TAKE 1 TABLET BY MOUTH TWICE A DAY   ferrous sulfate 325 (65 FE) MG EC tablet Take 325 mg by mouth in the morning and at bedtime.   fluticasone 50 MCG/ACT nasal spray Commonly known as: FLONASE Place 1 spray into both nostrils daily. Started by: Chesley Mires, MD   ipratropium 0.03 % nasal spray Commonly known as: ATROVENT Place 2 sprays into both nostrils 3 (three) times daily as needed for rhinitis.   levothyroxine 88 MCG tablet Commonly known as: SYNTHROID Take 1 tablet (88 mcg total) by mouth every morning.   losartan 100 MG tablet Commonly known as: COZAAR Take 1 tablet (100 mg total) by mouth daily.   metFORMIN 500 MG tablet Commonly known as: GLUCOPHAGE Take 1 tablet (500 mg total) by mouth 2 (two) times daily with a meal.   montelukast 10 MG tablet Commonly known as: SINGULAIR Take 1 tablet (10 mg total) by mouth at bedtime.   multivitamin with minerals Tabs tablet Take 1 tablet by mouth 2 (two) times daily.   pantoprazole 40 MG tablet Commonly known as: PROTONIX Take 1 tablet (40 mg total) by mouth daily.   rOPINIRole 1 MG tablet Commonly known as: REQUIP Take 1 tablet (1 mg total) by mouth at bedtime.   rosuvastatin 10 MG tablet Commonly known as: Crestor Take 1 tablet (10 mg total) by mouth daily.   tacrolimus 1 MG capsule Commonly known as: PROGRAF Take 1 mg by mouth 2 (two) times daily.   Trelegy Ellipta 100-62.5-25 MCG/ACT Aepb Generic drug:  Fluticasone-Umeclidin-Vilant Inhale 1 puff into the lungs daily.        Signature:  Chesley Mires, MD [place] Pager - [phone] 03/21/2021, 2:51 PM

## 2021-03-28 ENCOUNTER — Other Ambulatory Visit: Payer: Self-pay | Admitting: Nurse Practitioner

## 2021-03-28 DIAGNOSIS — E782 Mixed hyperlipidemia: Secondary | ICD-10-CM

## 2021-04-07 ENCOUNTER — Encounter: Payer: Medicare PPO | Admitting: Nurse Practitioner

## 2021-04-07 DIAGNOSIS — H9071 Mixed conductive and sensorineural hearing loss, unilateral, right ear, with unrestricted hearing on the contralateral side: Secondary | ICD-10-CM | POA: Diagnosis not present

## 2021-04-07 DIAGNOSIS — D333 Benign neoplasm of cranial nerves: Secondary | ICD-10-CM | POA: Diagnosis not present

## 2021-04-07 DIAGNOSIS — H6121 Impacted cerumen, right ear: Secondary | ICD-10-CM | POA: Diagnosis not present

## 2021-04-08 ENCOUNTER — Other Ambulatory Visit: Payer: Self-pay

## 2021-04-08 ENCOUNTER — Ambulatory Visit (INDEPENDENT_AMBULATORY_CARE_PROVIDER_SITE_OTHER): Payer: Medicare PPO | Admitting: Nurse Practitioner

## 2021-04-08 ENCOUNTER — Encounter: Payer: Self-pay | Admitting: Nurse Practitioner

## 2021-04-08 DIAGNOSIS — Z0000 Encounter for general adult medical examination without abnormal findings: Secondary | ICD-10-CM | POA: Diagnosis not present

## 2021-04-08 DIAGNOSIS — E2839 Other primary ovarian failure: Secondary | ICD-10-CM | POA: Diagnosis not present

## 2021-04-08 NOTE — Patient Instructions (Signed)
[name] , Thank you for taking time to come for your Medicare Wellness Visit. I appreciate your ongoing commitment to your health goals. Please review the following plan we discussed and let me know if I can assist you in the future.   Screening recommendations/referrals: Colonoscopy aged out Mammogram aged out Bone Density order placed today, to call and make appt  Recommended yearly ophthalmology/optometry visit for glaucoma screening and checkup Recommended yearly dental visit for hygiene and checkup  Vaccinations: Influenza vaccine up to date Pneumococcal vaccine up to date Tdap vaccine DUE- recommend to get at your local pharmacy    Shingles vaccine up to date    Advanced directives: please bring to your next appt so we can place on file- healthcare power of attorney, living will  Conditions/risks identified: advance age, obesity, diabetes, hyperlipidemia, hypertension  Next appointment: yearly for AWV   Preventive Care 32 Years and Older, Female Preventive care refers to lifestyle choices and visits with your health care provider that can promote health and wellness. What does preventive care include? A yearly physical exam. This is also called an annual well check. Dental exams once or twice a year. Routine eye exams. Ask your health care provider how often you should have your eyes checked. Personal lifestyle choices, including: Daily care of your teeth and gums. Regular physical activity. Eating a healthy diet. Avoiding tobacco and drug use. Limiting alcohol use. Practicing safe sex. Taking low-dose aspirin every day. Taking vitamin and mineral supplements as recommended by your health care provider. What happens during an annual well check? The services and screenings done by your health care provider during your annual well check will depend on your age, overall health, lifestyle risk factors, and family history of disease. Counseling  Your health care  provider may ask you questions about your: Alcohol use. Tobacco use. Drug use. Emotional well-being. Home and relationship well-being. Sexual activity. Eating habits. History of falls. Memory and ability to understand (cognition). Work and work Statistician. Reproductive health. Screening  You may have the following tests or measurements: Height, weight, and BMI. Blood pressure. Lipid and cholesterol levels. These may be checked every 5 years, or more frequently if you are over 67 years old. Skin check. Lung cancer screening. You may have this screening every year starting at age 32 if you have a 30-pack-year history of smoking and currently smoke or have quit within the past 15 years. Fecal occult blood test (FOBT) of the stool. You may have this test every year starting at age 62. Flexible sigmoidoscopy or colonoscopy. You may have a sigmoidoscopy every 5 years or a colonoscopy every 10 years starting at age 53. Hepatitis C blood test. Hepatitis B blood test. Sexually transmitted disease (STD) testing. Diabetes screening. This is done by checking your blood sugar (glucose) after you have not eaten for a while (fasting). You may have this done every 1-3 years. Bone density scan. This is done to screen for osteoporosis. You may have this done starting at age 89. Mammogram. This may be done every 1-2 years. Talk to your health care provider about how often you should have regular mammograms. Talk with your health care provider about your test results, treatment options, and if necessary, the need for more tests. Vaccines  Your health care provider may recommend certain vaccines, such as: Influenza vaccine. This is recommended every year. Tetanus, diphtheria, and acellular pertussis (Tdap, Td) vaccine. You may need a Td booster every 10 years. Zoster vaccine. You may need  this after age 34. Pneumococcal 13-valent conjugate (PCV13) vaccine. One dose is recommended after age  90. Pneumococcal polysaccharide (PPSV23) vaccine. One dose is recommended after age 29. Talk to your health care provider about which screenings and vaccines you need and how often you need them. This information is not intended to replace advice given to you by your health care provider. Make sure you discuss any questions you have with your health care provider. Document Released: 02/22/2015 Document Revised: 10/16/2015 Document Reviewed: 11/27/2014 Elsevier Interactive Patient Education  2017 [place] Prevention in the Home Falls can cause injuries. They can happen to people of all ages. There are many things you can do to make your home safe and to help prevent falls. What can I do on the outside of my home? Regularly fix the edges of walkways and driveways and fix any cracks. Remove anything that might make you trip as you walk through a door, such as a raised step or threshold. Trim any bushes or trees on the path to your home. Use bright outdoor lighting. Clear any walking paths of anything that might make someone trip, such as rocks or tools. Regularly check to see if handrails are loose or broken. Make sure that both sides of any steps have handrails. Any raised decks and porches should have guardrails on the edges. Have any leaves, snow, or ice cleared regularly. Use sand or salt on walking paths during winter. Clean up any spills in your garage right away. This includes oil or grease spills. What can I do in the bathroom? Use night lights. Install grab bars by the toilet and in the tub and shower. Do not use towel bars as grab bars. Use non-skid mats or decals in the tub or shower. If you need to sit down in the shower, use a plastic, non-slip stool. Keep the floor dry. Clean up any water that spills on the floor as soon as it happens. Remove soap buildup in the tub or shower regularly. Attach bath mats securely with double-sided non-slip rug tape. Do not have throw  rugs and other things on the floor that can make you trip. What can I do in the bedroom? Use night lights. Make sure that you have a light by your bed that is easy to reach. Do not use any sheets or blankets that are too big for your bed. They should not hang down onto the floor. Have a firm chair that has side arms. You can use this for support while you get dressed. Do not have throw rugs and other things on the floor that can make you trip. What can I do in the kitchen? Clean up any spills right away. Avoid walking on wet floors. Keep items that you use a lot in easy-to-reach places. If you need to reach something above you, use a strong step stool that has a grab bar. Keep electrical cords out of the way. Do not use floor polish or wax that makes floors slippery. If you must use wax, use non-skid floor wax. Do not have throw rugs and other things on the floor that can make you trip. What can I do with my stairs? Do not leave any items on the stairs. Make sure that there are handrails on both sides of the stairs and use them. Fix handrails that are broken or loose. Make sure that handrails are as long as the stairways. Check any carpeting to make sure that it is firmly attached to  the stairs. Fix any carpet that is loose or worn. Avoid having throw rugs at the top or bottom of the stairs. If you do have throw rugs, attach them to the floor with carpet tape. Make sure that you have a light switch at the top of the stairs and the bottom of the stairs. If you do not have them, ask someone to add them for you. What else can I do to help prevent falls? Wear shoes that: Do not have high heels. Have rubber bottoms. Are comfortable and fit you well. Are closed at the toe. Do not wear sandals. If you use a stepladder: Make sure that it is fully opened. Do not climb a closed stepladder. Make sure that both sides of the stepladder are locked into place. Ask someone to hold it for you, if  possible. Clearly mark and make sure that you can see: Any grab bars or handrails. First and last steps. Where the edge of each step is. Use tools that help you move around (mobility aids) if they are needed. These include: Canes. Walkers. Scooters. Crutches. Turn on the lights when you go into a dark area. Replace any light bulbs as soon as they burn out. Set up your furniture so you have a clear path. Avoid moving your furniture around. If any of your floors are uneven, fix them. If there are any pets around you, be aware of where they are. Review your medicines with your doctor. Some medicines can make you feel dizzy. This can increase your chance of falling. Ask your doctor what other things that you can do to help prevent falls. This information is not intended to replace advice given to you by your health care provider. Make sure you discuss any questions you have with your health care provider. Document Released: 11/22/2008 Document Revised: 07/04/2015 Document Reviewed: 03/02/2014 Elsevier Interactive Patient Education  2017 [employer].

## 2021-04-08 NOTE — Progress Notes (Signed)
This service is provided via telemedicine  No vital signs collected/recorded due to the encounter was a telemedicine visit.   Location of patient (ex: home, work):  Home  Patient consents to a telephone visit:  Yes, see encounter dated 04/08/2021  Location of the provider (ex: office, home):  [place]  Name of any referring provider:  N/A  Names of all persons participating in the telemedicine service and their role in the encounter:  Sherrie Mustache, Nurse Practitioner, Carroll Kinds, CMA, and patient.   Time spent on call:  9 minutes with medical assistant

## 2021-04-08 NOTE — Progress Notes (Signed)
Subjective:   [name] is a 81 y.o. female who presents for Medicare Annual (Subsequent) preventive examination.  Review of Systems     Cardiac Risk Factors include: diabetes mellitus;advanced age (>27men, >41 women);sedentary lifestyle;dyslipidemia;hypertension;obesity (BMI >30kg/m2)     Objective:    There were no vitals filed for this visit. There is no height or weight on file to calculate BMI.  Advanced Directives 04/08/2021 03/10/2021 08/05/2019 02/12/2019 02/07/2019 02/06/2019 05/21/2017  Does Patient Have a Medical Advance Directive? - Yes Yes No Yes No Yes  Type of Advance Directive Healthcare Power of [place] will - Living will - (No Data)  Does patient want to make changes to medical advance directive? - No - Patient declined - - No - Patient declined - No - Patient declined  Copy of Amanda in Chart? No - copy requested No - copy requested - - - - -  Would patient like information on creating a medical advance directive? - - - - No - Patient declined No - Patient declined No - Patient declined    Current Medications (verified) Outpatient Encounter Medications as of 04/08/2021  Medication Sig   albuterol (VENTOLIN HFA) 108 (90 Base) MCG/ACT inhaler Inhale 1 puff into the lungs as needed for wheezing or shortness of breath.   Calcium Carb-Cholecalciferol (CALCIUM-VITAMIN D) 600-400 MG-UNIT TABS Take 1 tablet by mouth daily.   citalopram (CELEXA) 20 MG tablet Take 1 tablet (20 mg total) by mouth at bedtime.   donepezil (ARICEPT) 5 MG tablet To take 1/2 tablet daily at bedtime for 4 weeks then increase to 1 tablet at bedtime.   ELIQUIS 5 MG TABS tablet TAKE 1 TABLET BY MOUTH TWICE A DAY   ferrous sulfate 325 (65 FE) MG EC tablet Take 325 mg by mouth in the morning and at bedtime.   fluticasone (FLONASE) 50 MCG/ACT nasal spray Place 1 spray into both nostrils daily.   Fluticasone-Umeclidin-Vilant  (TRELEGY ELLIPTA) 100-62.5-25 MCG/ACT AEPB Inhale 1 puff into the lungs daily.   ipratropium (ATROVENT) 0.03 % nasal spray Place 2 sprays into both nostrils 3 (three) times daily as needed for rhinitis.   levothyroxine (SYNTHROID) 88 MCG tablet Take 1 tablet (88 mcg total) by mouth every morning.   losartan (COZAAR) 100 MG tablet Take 1 tablet (100 mg total) by mouth daily.   metFORMIN (GLUCOPHAGE) 500 MG tablet Take 1 tablet (500 mg total) by mouth 2 (two) times daily with a meal.   montelukast (SINGULAIR) 10 MG tablet Take 1 tablet (10 mg total) by mouth at bedtime.   Multiple Vitamin (MULTIVITAMIN WITH MINERALS) TABS tablet Take 1 tablet by mouth 2 (two) times daily.   pantoprazole (PROTONIX) 40 MG tablet Take 1 tablet (40 mg total) by mouth daily.   rOPINIRole (REQUIP) 1 MG tablet Take 1 tablet (1 mg total) by mouth at bedtime.   rosuvastatin (CRESTOR) 10 MG tablet TAKE 2 TABLETS BY MOUTH EVERY DAY   tacrolimus (PROGRAF) 1 MG capsule Take 1 mg by mouth 2 (two) times daily.    No facility-administered encounter medications on file as of 04/08/2021.    Allergies (verified) Patient has no known allergies.   History: Past Medical History:  Diagnosis Date   Actinic keratosis    Arthritis    Asthma    Back injury 2019   Fracture   Basal cell carcinoma    COPD (chronic obstructive pulmonary disease) (HCC)    GERD (gastroesophageal reflux  disease)    H/O blood clots 2020   Lung, Per [place] New Patient Packet   H/O mammogram 2022   Per [place] New Patient Packet   Heart murmur    hx of in childhood    High cholesterol    Hyperlipidemia    Hypertension    Hypothyroidism    Kidney disease    Per [place] New Patient Packet   MCNS (minimal change nephrotic syndrome)    OSA (obstructive sleep apnea) 08/29/2019   Osteopenia    Shortness of breath dyspnea    on exertion    Squamous cell carcinoma    Thyroid disease    Per [facility] New Patient Packet   Past Surgical History:  Procedure  Laterality Date   ABDOMINAL HYSTERECTOMY     APPENDECTOMY     BILATERAL SALPINGOOPHORECTOMY     Ovarian Cysts    COLONOSCOPY  2001   Dr.Mann, Per [facility] New Patient Packet   CONVERSION TO TOTAL KNEE Right 07/10/2014   Procedure: RIGHT CONVERSION OF PARTIAL KNEE TO TOTAL KNEE;  Surgeon: Paralee Cancel, MD;  Location: [facility];  Service: Orthopedics;  Laterality: Right;   IR ANGIOGRAM PULMONARY BILATERAL SELECTIVE  02/08/2019   IR ANGIOGRAM SELECTIVE EACH ADDITIONAL VESSEL  02/08/2019   IR ANGIOGRAM SELECTIVE EACH ADDITIONAL VESSEL  02/08/2019   IR INFUSION THROMBOL ARTERIAL INITIAL (MS)  02/08/2019   IR INFUSION THROMBOL ARTERIAL INITIAL (MS)  02/08/2019   IR THROMB F/U EVAL ART/VEN FINAL DAY (MS)  02/09/2019   IR US GUIDE VASC ACCESS RIGHT  02/08/2019   MEDIAL PARTIAL KNEE REPLACEMENT Bilateral    MOHS SURGERY     x 2   RENAL BIOPSY     x 2   REPLACEMENT TOTAL KNEE  2012   Per [place] New Patient Packet   ROTATOR CUFF REPAIR Left 2008   Family History  Problem Relation Age of Onset   COPD Mother        Husband Smoked    Lymphoma Father    Hypertension Sister    Scoliosis Daughter    Stroke Maternal Grandfather    Rheumatic fever Paternal Grandmother    Heart disease Paternal Grandfather    Social History   Socioeconomic History   Marital status: Widowed    Spouse name: Not on file   Number of children: 2   Years of education: 16   Highest education level: Not on file  Occupational History   Occupation: TEACHER     Employer: [employer]: RETIRED   Tobacco Use   Smoking status: Former    Packs/day: 1.50    Years: 25.00    Pack years: 37.50    Types: Cigarettes    Quit date: 01/12/1973    Years since quitting: 48.2   Smokeless tobacco: Never  Vaping Use   Vaping Use: Never used  Substance and Sexual Activity   Alcohol use: Not Currently    Alcohol/week: 0.5 standard drinks    Types: 1 Standard drinks or equivalent per week    Comment: She  occasionally drinks a glass of wine.     Drug use: No   Sexual activity: Not Currently    Partners: Male  Other Topics Concern   Not on file  Social History Narrative   Marital Status:  Widowed   Siblings: Kitty Simmons/ Rachel RuthChildren:  2;  Grandchildren:  5 Pets: NoneLiving Situation: Lives aloneOccupation: Retired Psychiatric nurse: Forensic psychologist Tobacco Use/Exposure:  She quit smoking >  20 years ago after having smoked 1 1/2 ppd for over 20 years.  Alcohol Use:  Occasional (Wine) Drug Use:  NoneDiet:  RegularExercise:  Water Aerobic Class 3 days a week and WeightsHobbies: Reading/ Crafts/ Biking/ Reading]      Per [place] patient packet abstracted on 11/01/2020   Diet: Left blank      Caffeine: Yes      Married, if yes what year: Widow, [place] in [place] you live in a house, apartment, assisted living, condo, trailer, ect: Patient did not answer correctly (lived alone until 10/1/20222)      Is it one or more stories: 2      How many persons live in your home? 2      Pets: cat      Highest level or education completed: 1 year of college       Current/Past profession: Control and instrumentation engineer       Exercise:      No            Type and how often:          Living Will: Yes   DNR: Yes   POA/HPOA: Yes      Functional Status:   Do you have difficulty bathing or dressing yourself?  Left blank   Do you have difficulty preparing food or eating?Left blank   Do you have difficulty managing your medications?Left blank   Do you have difficulty managing your finances?Left blank   Do you have difficulty affording your medications?Left blank   Social Determinants of Health   Financial Resource Strain: Not on file  Food Insecurity: Not on file  Transportation Needs: Not on file  Physical Activity: Not on file  Stress: Not on file  Social Connections: Not on file    Tobacco Counseling Counseling given: Not Answered   Clinical Intake:  Pre-visit preparation completed:  Yes  Pain : No/denies pain     BMI - recorded: 34 Nutritional Risks: None Diabetes: Yes  How often do you need to have someone help you when you read instructions, pamphlets, or other written materials from your doctor or pharmacy?: 1 - Never  Diabetic?yes         Activities of Daily Living In your present state of health, do you have any difficulty performing the following activities: 04/08/2021  Hearing? Y  Vision? N  Difficulty concentrating or making decisions? N  Walking or climbing stairs? N  Dressing or bathing? N  Doing errands, shopping? N  Preparing Food and eating ? N  Using the Toilet? N  In the past six months, have you accidently leaked urine? Y  Do you have problems with loss of bowel control? N  Managing your Medications? N  Managing your Finances? N  Comment daughter helps  Housekeeping or managing your Housekeeping? N  Some recent data might be hidden    Patient Care Team: [name], NP as PCP - General (Geriatric Medicine) Paralee Cancel, MD as Consulting Physician (Orthopedic Surgery) Jari Pigg, MD as Consulting Physician (Dermatology) Biagio Quint, MD as Referring Physician (Nephrology) Chesley Mires, MD as Consulting Physician (Pulmonary Disease)  Indicate any recent Medical Services you may have received from other than [facility] providers in the past year (date may be approximate).     Assessment:   This is a routine wellness examination for [name].  Hearing/Vision screen Hearing Screening - Comments:: Patient wears hearing aids. Vision Screening - Comments:: Patient has  no vision problems. Patient had eye exam last June. Patient sees Dr. Idolina Primer  Dietary issues and exercise activities discussed: Current Exercise Habits: The patient does not participate in regular exercise at present   Goals Addressed   None    Depression Screen PHQ 2/9 Scores 04/08/2021 03/10/2021 12/12/2012  PHQ - 2 Score 0 0 0    Fall Risk Fall Risk   04/08/2021 03/10/2021 11/04/2020 12/12/2012  Falls in the past year? 0 0 1 No  Number falls in past yr: 0 0 1 -  Injury with Fall? 0 0 1 -  Comment - - Broke toe -  Risk for fall due to : No Fall Risks No Fall Risks History of fall(s) -  Follow up Falls evaluation completed Falls evaluation completed Falls evaluation completed -    FALL RISK PREVENTION PERTAINING TO THE HOME:  Any stairs in or around the home? Yes  If so, are there any without handrails? No  Home free of loose throw rugs in walkways, pet beds, electrical cords, etc? Yes  Adequate lighting in your home to reduce risk of falls? Yes   ASSISTIVE DEVICES UTILIZED TO PREVENT FALLS:  Life alert? No  Use of a cane, walker or w/c? No  Grab bars in the bathroom? Yes  Shower chair or bench in shower? Yes  Elevated toilet seat or a handicapped toilet? No   TIMED UP AND GO:  Was the test performed? No .    Cognitive Function: MMSE - Mini Mental State Exam 01/13/2021  Orientation to time 4  Orientation to time comments said the 4th  Orientation to Place 5  Registration 3  Attention/ Calculation 5  Recall 2  Recall-comments missed penny  Language- name 2 objects 2  Language- repeat 1  Language- follow 3 step command 3  Language- read & follow direction 0  Language-read & follow direction-comments did not read aloud  Write a sentence 1  Copy design 1  Total score 27        Immunizations Immunization History  Administered Date(s) Administered   Fluad Quad(high Dose 65+) 09/13/2018, 11/04/2020   Influenza, High Dose Seasonal PF 10/06/2018, 11/07/2019   Influenza,inj,Quad PF,6+ Mos 10/09/2016, 10/27/2017   Influenza-Unspecified 11/09/2013, 10/08/2014, 11/10/2015, 10/09/2016   PFIZER(Purple Top)SARS-COV-2 Vaccination 04/09/2019, 05/09/2019, 12/26/2019   Pneumococcal Conjugate-13 12/26/2014, 04/21/2017   Pneumococcal Polysaccharide-23 02/10/2011   Tdap 12/09/2010   Zoster Recombinat (Shingrix) 04/21/2017,  07/02/2017   Zoster, Live 02/16/2008    TDAP status: Due, Education has been provided regarding the importance of this vaccine. Advised may receive this vaccine at local pharmacy or Health Dept. Aware to provide a copy of the vaccination record if obtained from local pharmacy or Health Dept. Verbalized acceptance and understanding.  Flu Vaccine status: Up to date  Pneumococcal vaccine status: Up to date  Covid-19 vaccine status: Information provided on how to obtain vaccines.   Qualifies for Shingles Vaccine? Yes   Zostavax completed Yes   Shingrix Completed?: Yes  Screening Tests Health Maintenance  Topic Date Due   COVID-19 Vaccine (4 - Booster for Pfizer series) 02/20/2020   TETANUS/TDAP  12/08/2020   OPHTHALMOLOGY EXAM  07/23/2021   HEMOGLOBIN A1C  08/31/2021   FOOT EXAM  03/10/2022   Pneumonia Vaccine 67+ Years old  Completed   INFLUENZA VACCINE  Completed   DEXA SCAN  Completed   Zoster Vaccines- Shingrix  Completed   HPV VACCINES  Aged Out    Health Maintenance  Health Maintenance Due  Topic  Date Due   COVID-19 Vaccine (4 - Booster for Pfizer series) 02/20/2020   TETANUS/TDAP  12/08/2020    Colorectal cancer screening: No longer required.   Mammogram status: No longer required due to aged out.  Bone Density status: Ordered today. Pt provided with contact info and advised to call to schedule appt.  Lung Cancer Screening: (Low Dose CT Chest recommended if Age 49-80 years, 30 pack-year currently smoking OR have quit w/in 15years.) does not qualify.   Lung Cancer Screening Referral: na  Additional Screening:  Hepatitis C Screening: does not qualify  Vision Screening: Recommended annual ophthalmology exams for early detection of glaucoma and other disorders of the eye. Is the patient up to date with their annual eye exam?  Yes  Who is the provider or what is the name of the office in which the patient attends annual eye exams? [facility] If pt is not established  with a provider, would they like to be referred to a provider to establish care? No .   Dental Screening: Recommended annual dental exams for proper oral hygiene  Community Resource Referral / Chronic Care Management: CRR required this visit?  No   CCM required this visit?  No      Plan:     I have personally reviewed and noted the following in the patients chart:   Medical and social history Use of alcohol, tobacco or illicit drugs  Current medications and supplements including opioid prescriptions.  Functional ability and status Nutritional status Physical activity Advanced directives List of other physicians Hospitalizations, surgeries, and ER visits in previous 12 months Vitals Screenings to include cognitive, depression, and falls Referrals and appointments  In addition, I have reviewed and discussed with patient certain preventive protocols, quality metrics, and best practice recommendations. A written personalized care plan for preventive services as well as general preventive health recommendations were provided to patient.     [name], NP   04/08/2021    Virtual Visit via Telephone Note  I connected with patient 04/08/21 at  4:15 PM EST by telephone and verified that I am speaking with the correct person using two identifiers.  Location: Patient: home Provider: [facility]   I discussed the limitations, risks, security and privacy concerns of performing an evaluation and management service by telephone and the availability of in person appointments. I also discussed with the patient that there may be a patient responsible charge related to this service. The patient expressed understanding and agreed to proceed.   I discussed the assessment and treatment plan with the patient. The patient was provided an opportunity to ask questions and all were answered. The patient agreed with the plan and demonstrated an understanding of the instructions.   The  patient was advised to call back or seek an in-person evaluation if the symptoms worsen or if the condition fails to improve as anticipated.  I provided 16 minutes of non-face-to-face time during this encounter.  Carlos American. Harle Battiest Avs printed and mailed

## 2021-04-14 DIAGNOSIS — H6981 Other specified disorders of Eustachian tube, right ear: Secondary | ICD-10-CM | POA: Diagnosis not present

## 2021-04-14 DIAGNOSIS — H6521 Chronic serous otitis media, right ear: Secondary | ICD-10-CM | POA: Diagnosis not present

## 2021-04-14 DIAGNOSIS — H9011 Conductive hearing loss, unilateral, right ear, with unrestricted hearing on the contralateral side: Secondary | ICD-10-CM | POA: Diagnosis not present

## 2021-04-17 ENCOUNTER — Other Ambulatory Visit: Payer: Self-pay | Admitting: Nurse Practitioner

## 2021-04-17 DIAGNOSIS — Z1231 Encounter for screening mammogram for malignant neoplasm of breast: Secondary | ICD-10-CM

## 2021-04-28 ENCOUNTER — Other Ambulatory Visit: Payer: Self-pay | Admitting: Nurse Practitioner

## 2021-04-28 DIAGNOSIS — G3184 Mild cognitive impairment, so stated: Secondary | ICD-10-CM

## 2021-05-03 ENCOUNTER — Other Ambulatory Visit: Payer: Self-pay | Admitting: Nurse Practitioner

## 2021-05-03 DIAGNOSIS — G2581 Restless legs syndrome: Secondary | ICD-10-CM

## 2021-05-03 DIAGNOSIS — E039 Hypothyroidism, unspecified: Secondary | ICD-10-CM

## 2021-05-09 DIAGNOSIS — D044 Carcinoma in situ of skin of scalp and neck: Secondary | ICD-10-CM | POA: Diagnosis not present

## 2021-05-09 DIAGNOSIS — D485 Neoplasm of uncertain behavior of skin: Secondary | ICD-10-CM | POA: Diagnosis not present

## 2021-05-09 DIAGNOSIS — L570 Actinic keratosis: Secondary | ICD-10-CM | POA: Diagnosis not present

## 2021-05-09 DIAGNOSIS — C4441 Basal cell carcinoma of skin of scalp and neck: Secondary | ICD-10-CM | POA: Diagnosis not present

## 2021-05-09 DIAGNOSIS — L578 Other skin changes due to chronic exposure to nonionizing radiation: Secondary | ICD-10-CM | POA: Diagnosis not present

## 2021-05-09 DIAGNOSIS — L821 Other seborrheic keratosis: Secondary | ICD-10-CM | POA: Diagnosis not present

## 2021-05-09 DIAGNOSIS — D225 Melanocytic nevi of trunk: Secondary | ICD-10-CM | POA: Diagnosis not present

## 2021-05-09 DIAGNOSIS — Z85828 Personal history of other malignant neoplasm of skin: Secondary | ICD-10-CM | POA: Diagnosis not present

## 2021-05-09 DIAGNOSIS — D0462 Carcinoma in situ of skin of left upper limb, including shoulder: Secondary | ICD-10-CM | POA: Diagnosis not present

## 2021-05-09 DIAGNOSIS — Z23 Encounter for immunization: Secondary | ICD-10-CM | POA: Diagnosis not present

## 2021-05-14 DIAGNOSIS — H7201 Central perforation of tympanic membrane, right ear: Secondary | ICD-10-CM | POA: Diagnosis not present

## 2021-05-14 DIAGNOSIS — H6981 Other specified disorders of Eustachian tube, right ear: Secondary | ICD-10-CM | POA: Diagnosis not present

## 2021-05-14 DIAGNOSIS — H6122 Impacted cerumen, left ear: Secondary | ICD-10-CM | POA: Diagnosis not present

## 2021-05-14 DIAGNOSIS — H906 Mixed conductive and sensorineural hearing loss, bilateral: Secondary | ICD-10-CM | POA: Diagnosis not present

## 2021-05-17 ENCOUNTER — Other Ambulatory Visit: Payer: Self-pay | Admitting: Pulmonary Disease

## 2021-05-23 ENCOUNTER — Other Ambulatory Visit: Payer: Self-pay | Admitting: Nurse Practitioner

## 2021-05-23 DIAGNOSIS — E119 Type 2 diabetes mellitus without complications: Secondary | ICD-10-CM

## 2021-05-28 ENCOUNTER — Other Ambulatory Visit: Payer: Self-pay | Admitting: Pulmonary Disease

## 2021-06-07 ENCOUNTER — Other Ambulatory Visit: Payer: Self-pay | Admitting: Nurse Practitioner

## 2021-06-07 DIAGNOSIS — K219 Gastro-esophageal reflux disease without esophagitis: Secondary | ICD-10-CM

## 2021-06-07 DIAGNOSIS — R0982 Postnasal drip: Secondary | ICD-10-CM

## 2021-06-07 DIAGNOSIS — R053 Chronic cough: Secondary | ICD-10-CM

## 2021-06-24 DIAGNOSIS — D0462 Carcinoma in situ of skin of left upper limb, including shoulder: Secondary | ICD-10-CM | POA: Diagnosis not present

## 2021-07-01 ENCOUNTER — Other Ambulatory Visit: Payer: Self-pay | Admitting: Nurse Practitioner

## 2021-07-02 DIAGNOSIS — C4441 Basal cell carcinoma of skin of scalp and neck: Secondary | ICD-10-CM | POA: Diagnosis not present

## 2021-07-16 ENCOUNTER — Other Ambulatory Visit: Payer: Self-pay | Admitting: Nurse Practitioner

## 2021-07-16 DIAGNOSIS — F419 Anxiety disorder, unspecified: Secondary | ICD-10-CM

## 2021-07-21 ENCOUNTER — Other Ambulatory Visit: Payer: Self-pay | Admitting: Nurse Practitioner

## 2021-07-21 DIAGNOSIS — N1831 Chronic kidney disease, stage 3a: Secondary | ICD-10-CM | POA: Diagnosis not present

## 2021-07-21 DIAGNOSIS — I10 Essential (primary) hypertension: Secondary | ICD-10-CM | POA: Diagnosis not present

## 2021-07-21 DIAGNOSIS — D508 Other iron deficiency anemias: Secondary | ICD-10-CM

## 2021-07-21 DIAGNOSIS — N1832 Chronic kidney disease, stage 3b: Secondary | ICD-10-CM

## 2021-07-22 ENCOUNTER — Other Ambulatory Visit: Payer: Medicare PPO

## 2021-07-22 DIAGNOSIS — I10 Essential (primary) hypertension: Secondary | ICD-10-CM | POA: Diagnosis not present

## 2021-07-22 DIAGNOSIS — D508 Other iron deficiency anemias: Secondary | ICD-10-CM | POA: Diagnosis not present

## 2021-07-22 DIAGNOSIS — N1832 Chronic kidney disease, stage 3b: Secondary | ICD-10-CM | POA: Diagnosis not present

## 2021-07-22 DIAGNOSIS — E1122 Type 2 diabetes mellitus with diabetic chronic kidney disease: Secondary | ICD-10-CM | POA: Diagnosis not present

## 2021-07-23 LAB — COMPLETE METABOLIC PANEL WITH GFR
AG Ratio: 1.3 (calc) (ref 1.0–2.5)
ALT: 13 U/L (ref 6–29)
AST: 11 U/L (ref 10–35)
Albumin: 4 g/dL (ref 3.6–5.1)
Alkaline phosphatase (APISO): 50 U/L (ref 37–153)
BUN: 16 mg/dL (ref 7–25)
CO2: 29 mmol/L (ref 20–32)
Calcium: 9.2 mg/dL (ref 8.6–10.4)
Chloride: 104 mmol/L (ref 98–110)
Creat: 0.93 mg/dL (ref 0.60–0.95)
Globulin: 3.1 g/dL (calc) (ref 1.9–3.7)
Glucose, Bld: 113 mg/dL — ABNORMAL HIGH (ref 65–99)
Potassium: 4.6 mmol/L (ref 3.5–5.3)
Sodium: 141 mmol/L (ref 135–146)
Total Bilirubin: 0.4 mg/dL (ref 0.2–1.2)
Total Protein: 7.1 g/dL (ref 6.1–8.1)
eGFR: 62 mL/min/{1.73_m2} (ref >=60)

## 2021-07-23 LAB — CBC WITH DIFFERENTIAL/PLATELET
Absolute Monocytes: 716 cells/uL (ref 200–950)
Basophils Absolute: 69 cells/uL (ref 0–200)
Basophils Relative: 0.9 %
Eosinophils Absolute: 169 cells/uL (ref 15–500)
Eosinophils Relative: 2.2 %
HCT: 42.2 % (ref 35.0–45.0)
Hemoglobin: 13.2 g/dL (ref 11.7–15.5)
Lymphs Abs: 1386 cells/uL (ref 850–3900)
MCH: 28.4 pg (ref 27.0–33.0)
MCHC: 31.3 g/dL — ABNORMAL LOW (ref 32.0–36.0)
MCV: 90.8 fL (ref 80.0–100.0)
MPV: 10.1 fL (ref 7.5–12.5)
Monocytes Relative: 9.3 %
Neutro Abs: 5359 cells/uL (ref 1500–7800)
Neutrophils Relative %: 69.6 %
Platelets: 280 10*3/uL (ref 140–400)
RBC: 4.65 10*6/uL (ref 3.80–5.10)
RDW: 14.2 % (ref 11.0–15.0)
Total Lymphocyte: 18 %
WBC: 7.7 10*3/uL (ref 3.8–10.8)

## 2021-07-23 LAB — HEMOGLOBIN A1C
Hgb A1c MFr Bld: 6.2 % of total Hgb — ABNORMAL HIGH (ref <5.7)
Mean Plasma Glucose: 131 mg/dL
eAG (mmol/L): 7.3 mmol/L

## 2021-07-23 LAB — IRON,TIBC AND FERRITIN PANEL
%SAT: 17 % (calc) (ref 16–45)
Ferritin: 23 ng/mL (ref 16–288)
Iron: 61 ug/dL (ref 45–160)
TIBC: 350 mcg/dL (calc) (ref 250–450)

## 2021-07-24 DIAGNOSIS — N182 Chronic kidney disease, stage 2 (mild): Secondary | ICD-10-CM | POA: Diagnosis not present

## 2021-07-24 DIAGNOSIS — Z87891 Personal history of nicotine dependence: Secondary | ICD-10-CM | POA: Diagnosis not present

## 2021-07-24 DIAGNOSIS — Z7984 Long term (current) use of oral hypoglycemic drugs: Secondary | ICD-10-CM | POA: Diagnosis not present

## 2021-07-24 DIAGNOSIS — E1122 Type 2 diabetes mellitus with diabetic chronic kidney disease: Secondary | ICD-10-CM | POA: Diagnosis not present

## 2021-07-24 DIAGNOSIS — I129 Hypertensive chronic kidney disease with stage 1 through stage 4 chronic kidney disease, or unspecified chronic kidney disease: Secondary | ICD-10-CM | POA: Diagnosis not present

## 2021-07-24 NOTE — Patient Instructions (Signed)
Please contact your local pharmacy, previous provider, or insurance carrier for vaccine/immunization records. Ensure that any procedures done outside of [facility] are faxed to us [phone] or you can sign release of records form at the front desk to keep your medical record updated.   ?

## 2021-07-25 ENCOUNTER — Encounter: Payer: Medicare PPO | Admitting: Nurse Practitioner

## 2021-07-28 NOTE — Progress Notes (Signed)
This encounter was created in error - please disregard.

## 2021-08-04 ENCOUNTER — Ambulatory Visit: Payer: Medicare PPO | Admitting: Nurse Practitioner

## 2021-08-04 ENCOUNTER — Encounter: Payer: Self-pay | Admitting: Nurse Practitioner

## 2021-08-04 VITALS — BP 130/82 | HR 77 | Temp 97.7°F | Ht 66.5 in | Wt 217.4 lb

## 2021-08-04 DIAGNOSIS — E782 Mixed hyperlipidemia: Secondary | ICD-10-CM | POA: Diagnosis not present

## 2021-08-04 DIAGNOSIS — K219 Gastro-esophageal reflux disease without esophagitis: Secondary | ICD-10-CM | POA: Diagnosis not present

## 2021-08-04 DIAGNOSIS — R053 Chronic cough: Secondary | ICD-10-CM

## 2021-08-04 DIAGNOSIS — G2581 Restless legs syndrome: Secondary | ICD-10-CM

## 2021-08-04 DIAGNOSIS — D508 Other iron deficiency anemias: Secondary | ICD-10-CM

## 2021-08-04 DIAGNOSIS — N1832 Chronic kidney disease, stage 3b: Secondary | ICD-10-CM

## 2021-08-04 DIAGNOSIS — E039 Hypothyroidism, unspecified: Secondary | ICD-10-CM | POA: Diagnosis not present

## 2021-08-04 DIAGNOSIS — G3184 Mild cognitive impairment, so stated: Secondary | ICD-10-CM | POA: Diagnosis not present

## 2021-08-04 DIAGNOSIS — I10 Essential (primary) hypertension: Secondary | ICD-10-CM | POA: Diagnosis not present

## 2021-08-04 DIAGNOSIS — E1122 Type 2 diabetes mellitus with diabetic chronic kidney disease: Secondary | ICD-10-CM

## 2021-08-04 MED ORDER — DONEPEZIL HCL 10 MG PO TABS: 10.0000 mg | ORAL_TABLET | Freq: Every day | ORAL | 1 refills | Status: DC

## 2021-08-07 DIAGNOSIS — H9071 Mixed conductive and sensorineural hearing loss, unilateral, right ear, with unrestricted hearing on the contralateral side: Secondary | ICD-10-CM | POA: Diagnosis not present

## 2021-09-02 ENCOUNTER — Other Ambulatory Visit: Payer: Self-pay | Admitting: Pulmonary Disease

## 2021-09-02 DIAGNOSIS — J449 Chronic obstructive pulmonary disease, unspecified: Secondary | ICD-10-CM

## 2021-09-10 DIAGNOSIS — L98499 Non-pressure chronic ulcer of skin of other sites with unspecified severity: Secondary | ICD-10-CM | POA: Diagnosis not present

## 2021-09-10 DIAGNOSIS — D0472 Carcinoma in situ of skin of left lower limb, including hip: Secondary | ICD-10-CM | POA: Diagnosis not present

## 2021-09-10 DIAGNOSIS — L578 Other skin changes due to chronic exposure to nonionizing radiation: Secondary | ICD-10-CM | POA: Diagnosis not present

## 2021-09-10 DIAGNOSIS — Z85828 Personal history of other malignant neoplasm of skin: Secondary | ICD-10-CM | POA: Diagnosis not present

## 2021-09-10 DIAGNOSIS — L821 Other seborrheic keratosis: Secondary | ICD-10-CM | POA: Diagnosis not present

## 2021-09-10 DIAGNOSIS — D225 Melanocytic nevi of trunk: Secondary | ICD-10-CM | POA: Diagnosis not present

## 2021-09-10 DIAGNOSIS — D485 Neoplasm of uncertain behavior of skin: Secondary | ICD-10-CM | POA: Diagnosis not present

## 2021-09-10 DIAGNOSIS — L570 Actinic keratosis: Secondary | ICD-10-CM | POA: Diagnosis not present

## 2021-09-18 ENCOUNTER — Other Ambulatory Visit: Payer: Self-pay | Admitting: Pulmonary Disease

## 2021-09-18 DIAGNOSIS — J449 Chronic obstructive pulmonary disease, unspecified: Secondary | ICD-10-CM

## 2021-09-23 ENCOUNTER — Ambulatory Visit
Admission: RE | Admit: 2021-09-23 | Discharge: 2021-09-23 | Disposition: A | Discharge status: Home / Self Care | Payer: Medicare PPO | Source: Ambulatory Visit | Attending: Nurse Practitioner | Admitting: Nurse Practitioner

## 2021-09-23 DIAGNOSIS — E2839 Other primary ovarian failure: Secondary | ICD-10-CM

## 2021-09-23 DIAGNOSIS — Z780 Asymptomatic menopausal state: Secondary | ICD-10-CM | POA: Diagnosis not present

## 2021-09-23 DIAGNOSIS — Z1231 Encounter for screening mammogram for malignant neoplasm of breast: Secondary | ICD-10-CM | POA: Diagnosis not present

## 2021-09-23 DIAGNOSIS — M85832 Other specified disorders of bone density and structure, left forearm: Secondary | ICD-10-CM | POA: Diagnosis not present

## 2021-09-23 DIAGNOSIS — M810 Age-related osteoporosis without current pathological fracture: Secondary | ICD-10-CM | POA: Diagnosis not present

## 2021-09-26 ENCOUNTER — Other Ambulatory Visit: Payer: Self-pay | Admitting: Pulmonary Disease

## 2021-09-26 ENCOUNTER — Telehealth: Payer: Self-pay | Admitting: Pulmonary Disease

## 2021-09-26 MED ORDER — APIXABAN 5 MG PO TABS: 5.0000 mg | ORAL_TABLET | Freq: Two times a day (BID) | ORAL | 3 refills | Status: DC

## 2021-09-26 NOTE — Telephone Encounter (Signed)
Rx for pt's Eliquis has been sent to preferred pharmacy for pt. Called and spoke with pt letting her know this had been done and she verbalized understanding. Nothing further needed.

## 2021-09-30 ENCOUNTER — Encounter: Payer: Self-pay | Admitting: Pulmonary Disease

## 2021-09-30 ENCOUNTER — Ambulatory Visit: Payer: Medicare PPO | Admitting: Pulmonary Disease

## 2021-09-30 VITALS — BP 130/80 | HR 79 | Ht 66.5 in | Wt 222.0 lb

## 2021-09-30 DIAGNOSIS — J432 Centrilobular emphysema: Secondary | ICD-10-CM

## 2021-09-30 DIAGNOSIS — J4540 Moderate persistent asthma, uncomplicated: Secondary | ICD-10-CM

## 2021-09-30 DIAGNOSIS — Z86711 Personal history of pulmonary embolism: Secondary | ICD-10-CM

## 2021-09-30 DIAGNOSIS — J310 Chronic rhinitis: Secondary | ICD-10-CM

## 2021-09-30 NOTE — Progress Notes (Signed)
[facility]  Chief Complaint  Patient presents with   Follow-up    Past Surgical History:  She  has a past surgical history that includes Rotator cuff repair (Left, 2008); Renal biopsy; Medial partial knee replacement (Bilateral); Abdominal hysterectomy; Bilateral salpingoophorectomy; Mohs surgery; Appendectomy; Conversion to total knee (Right, 07/10/2014); IR Angiogram Selective Each Additional Vessel (02/08/2019); IR INFUSION THROMBOL ARTERIAL INITIAL (MS) (02/08/2019); IR INFUSION THROMBOL ARTERIAL INITIAL (MS) (02/08/2019); IR Angiogram Pulmonary Bilateral Selective (02/08/2019); IR US Guide Vasc Access Right (02/08/2019); IR Angiogram Selective Each Additional Vessel (02/08/2019); IR THROMB F/U EVAL ART/VEN FINAL DAY (MS) (02/09/2019); Replacement total knee (2012); and Colonoscopy (2001).  Past Medical History:  OA, COPD, GERD, HLD, HTN, Hypothyroidism, Nephrotic syndrome, Osteopenia  Constitutional:  BP 130/80 (BP Location: Left Arm)   Pulse 79   Ht 5' 6.5" (1.689 m)   Wt 222 lb (100.7 kg)   SpO2 94%   BMI 35.29 kg/m   Brief Summary:  [name] is a 81 y.o. female former smoker with asthma, emphysema, pulmonary embolism, and dyspnea on exertion.  She has a history of obstructive sleep apnea, but wasn't able to tolerate CPAP therapy.      Subjective:   She is here with her daughter.    Still has runny nose, but better since she started flonase.  Uses atrovent when she remembers, but sometimes twice per day.  Has a croupy cough.  Thinks this is mostly from post nasal drip.  She gets winded with activity at home, but feels better after resting a few minutes.  She isn't able to exercise much due to back pain.  Sleeping okay.  Physical Exam:   Appearance - well kempt   ENMT - no sinus tenderness, no oral exudate, no LAN, Mallampati 3 airway, no stridor  Respiratory - equal breath sounds bilaterally, no wheezing or  rales  CV - s1s2 regular rate and rhythm, no murmurs  Ext - no clubbing, no edema  Skin - no rashes  Psych - normal mood and affect     Pulmonary testing:  PFT 06/13/19 >> FEV1 1.45 (66%), FEV1% 81, TLC 3.85 (71%), DLCO 55%  Chest Imaging:  CT chest 07/14/19 >> mild emphysema, small hiatal hernia  Sleep Tests:  PSG 08/05/19 >> AHI 26.3, SpO2 low 85% Auto CPAP 10/11/19 to 11/02/19 >> used on 23 of 23 nights with average 9 hrs 12 min.  Average AHI 3.5 with median CPAP 8 and 95 th percentile CPAP 10 cm H2O  Cardiac Tests:  Echo 04/11/19 >> EF 65 to 70%, mild LVH, grade 1 DD  Social History:  She  reports that she quit smoking about 48 years ago. Her smoking use included cigarettes. She has a 37.50 pack-year smoking history. She has never used smokeless tobacco. She reports that she does not currently use alcohol after a past usage of about 0.5 standard drinks of alcohol per week. She reports that she does not use drugs.  Family History:  Her family history includes COPD in her mother; Heart disease in her paternal grandfather; Hypertension in her sister; Lymphoma in her father; Rheumatic fever in her paternal grandmother; Scoliosis in her daughter; Stroke in her maternal grandfather.     Assessment/Plan:   Emphysema, asthma. - continue trelegy 100 one puff daily and singulair - prn albuterol  Chronic rhinitis. - continue flonase - advised her to try using mucinex and atrovent nasal spray more often   Hx of pulmonary embolism. -  she will continue with eliquis indefinitely  CKD 3a, Minimal change disease. - followed by [facility]rology at [facility]  Time Spent Involved in Patient Care on Day of Examination:  28 minutes  Follow up:   Patient Instructions  You can using mucinex as needed to help with cough and chest congestion  Follow up in 6 months  Medication List:   Allergies as of 09/30/2021   No Known Allergies      Medication List        Accurate as of September 30, 2021  2:28 PM. If you have any questions, ask your nurse or doctor.          albuterol 108 (90 Base) MCG/ACT inhaler Commonly known as: VENTOLIN HFA Inhale 1 puff into the lungs as needed for wheezing or shortness of breath.   apixaban 5 MG Tabs tablet Commonly known as: Eliquis Take 1 tablet (5 mg total) by mouth 2 (two) times daily.   Calcium-Vitamin D 600-400 MG-UNIT Tabs Take 1 tablet by mouth daily.   citalopram 20 MG tablet Commonly known as: CELEXA TAKE 1 TABLET BY MOUTH EVERYDAY AT BEDTIME   donepezil 10 MG tablet Commonly known as: ARICEPT Take 1 tablet (10 mg total) by mouth at bedtime.   ferrous sulfate 325 (65 FE) MG EC tablet Take 325 mg by mouth in the morning and at bedtime.   fluticasone 50 MCG/ACT nasal spray Commonly known as: FLONASE SPRAY 1 SPRAY INTO BOTH NOSTRILS DAILY.   ipratropium 0.03 % nasal spray Commonly known as: ATROVENT Place 2 sprays into both nostrils 3 (three) times daily as needed for rhinitis.   levothyroxine 88 MCG tablet Commonly known as: SYNTHROID TAKE 1 TABLET BY MOUTH EVERY MORNING.   losartan 100 MG tablet Commonly known as: COZAAR TAKE 1 TABLET BY MOUTH EVERY DAY   metFORMIN 500 MG tablet Commonly known as: GLUCOPHAGE TAKE 1 TABLET BY MOUTH 2 TIMES DAILY WITH A MEAL.   montelukast 10 MG tablet Commonly known as: SINGULAIR TAKE 1 TABLET BY MOUTH EVERYDAY AT BEDTIME   multivitamin with minerals Tabs tablet Take 1 tablet by mouth 2 (two) times daily.   pantoprazole 40 MG tablet Commonly known as: PROTONIX TAKE 1 TABLET BY MOUTH EVERY DAY   rOPINIRole 1 MG tablet Commonly known as: REQUIP TAKE 1 TABLET BY MOUTH AT BEDTIME.   rosuvastatin 10 MG tablet Commonly known as: CRESTOR TAKE 2 TABLETS BY MOUTH EVERY DAY   tacrolimus 1 MG capsule Commonly known as: PROGRAF Take 1 mg by mouth 2 (two) times daily.   Trelegy Ellipta 100-62.5-25 MCG/ACT Aepb Generic drug: Fluticasone-Umeclidin-Vilant TAKE 1 PUFF BY  MOUTH EVERY DAY        Signature:  Chesley Mires, MD [place] Pager - [phone] 09/30/2021, 2:28 PM

## 2021-09-30 NOTE — Patient Instructions (Signed)
You can using mucinex as needed to help with cough and chest congestion  Follow up in 6 months

## 2021-10-03 ENCOUNTER — Ambulatory Visit: Payer: Medicare PPO | Admitting: Nurse Practitioner

## 2021-10-03 ENCOUNTER — Encounter: Payer: Self-pay | Admitting: Nurse Practitioner

## 2021-10-03 VITALS — BP 108/82 | HR 80 | Temp 96.8°F | Ht 66.5 in | Wt 219.0 lb

## 2021-10-03 DIAGNOSIS — M810 Age-related osteoporosis without current pathological fracture: Secondary | ICD-10-CM | POA: Diagnosis not present

## 2021-10-03 NOTE — Progress Notes (Signed)
Careteam: Patient Care Team: [name], NP as PCP - General (Geriatric Medicine) Paralee Cancel, MD as Consulting Physician (Orthopedic Surgery) Jari Pigg, MD as Consulting Physician (Dermatology) Biagio Quint, MD as Referring Physician (Nephrology) Chesley Mires, MD as Consulting Physician (Pulmonary Disease)  PLACE OF SERVICE:  [place] Directive information Does Patient Have a Medical Advance Directive?: Yes, Type of Advance Directive: Living will, Does patient want to make changes to medical advance directive?: No - Patient declined  No Known Allergies  Chief Complaint  Patient presents with   Follow-up    Bone density review, go over treatment plan. Here with [name], [name]      HPI: Patient is a 81 y.o. female for osteoporosis. She was diagnosised with osteopenia 10 years ago but unsure of medication.  She is taking calcium or vit d She reports her sister is on prolia and would like to get this.  Does not want to take another medication, hard to keep up with all the prescriptions.   She has GERD- has a lot of burping even though she is on protonix. Occasionally will be sour.  Protonix has been beneficial .  Review of Systems:  Review of Systems  Constitutional:  Negative for chills and fever.  Gastrointestinal:  Negative for heartburn.  Musculoskeletal:  Negative for myalgias.          Past Medical History:  Diagnosis Date   Actinic keratosis    Arthritis    Asthma    Back injury 2019   Fracture   Basal cell carcinoma    COPD (chronic obstructive pulmonary disease) (HCC)    GERD (gastroesophageal reflux disease)    H/O blood clots 2020   Lung, Per [place] New Patient Packet   H/O mammogram 2022   Per [place] New Patient Packet   Heart murmur    hx of in childhood    High cholesterol    Hyperlipidemia    Hypertension    Hypothyroidism    Kidney disease    Per [place] New Patient Packet   MCNS (minimal change nephrotic syndrome)     OSA (obstructive sleep apnea) 08/29/2019   Osteopenia    Shortness of breath dyspnea    on exertion    Squamous cell carcinoma    Thyroid disease    Per [facility] New Patient Packet   Past Surgical History:  Procedure Laterality Date   ABDOMINAL HYSTERECTOMY     APPENDECTOMY     BILATERAL SALPINGOOPHORECTOMY     Ovarian Cysts    COLONOSCOPY  2001   Dr.Mann, Per [facility] New Patient Packet   CONVERSION TO TOTAL KNEE Right 07/10/2014   Procedure: RIGHT CONVERSION OF PARTIAL KNEE TO TOTAL KNEE;  Surgeon: Paralee Cancel, MD;  Location: [facility];  Service: Orthopedics;  Laterality: Right;   IR ANGIOGRAM PULMONARY BILATERAL SELECTIVE  02/08/2019   IR ANGIOGRAM SELECTIVE EACH ADDITIONAL VESSEL  02/08/2019   IR ANGIOGRAM SELECTIVE EACH ADDITIONAL VESSEL  02/08/2019   IR INFUSION THROMBOL ARTERIAL INITIAL (MS)  02/08/2019   IR INFUSION THROMBOL ARTERIAL INITIAL (MS)  02/08/2019   IR THROMB F/U EVAL ART/VEN FINAL DAY (MS)  02/09/2019   IR US GUIDE VASC ACCESS RIGHT  02/08/2019   MEDIAL PARTIAL KNEE REPLACEMENT Bilateral    MOHS SURGERY     x 2   RENAL BIOPSY     x 2   REPLACEMENT TOTAL KNEE  2012   Per [place] [facility] New Patient Packet   ROTATOR CUFF  REPAIR Left 2008   Social History:   reports that she quit smoking about 48 years ago. Her smoking use included cigarettes. She has a 37.50 pack-year smoking history. She has never used smokeless tobacco. She reports current alcohol use of about 0.5 standard drinks of alcohol per week. She reports that she does not use drugs.  Family History  Problem Relation Age of Onset   COPD Mother        Husband Smoked    Lymphoma Father    Hypertension Sister    Scoliosis [name]    Stroke Maternal Grandfather    Rheumatic fever Paternal Grandmother    Heart disease Paternal Grandfather     Medications: Patient's Medications  New Prescriptions   No medications on file  Previous Medications   ALBUTEROL (VENTOLIN HFA) 108 (90 BASE) MCG/ACT INHALER    Inhale 1  puff into the lungs as needed for wheezing or shortness of breath.   APIXABAN (ELIQUIS) 5 MG TABS TABLET    Take 1 tablet (5 mg total) by mouth 2 (two) times daily.   CALCIUM CARB-CHOLECALCIFEROL (CALCIUM-VITAMIN D) 600-400 MG-UNIT TABS    Take 1 tablet by mouth daily.   CITALOPRAM (CELEXA) 20 MG TABLET    TAKE 1 TABLET BY MOUTH EVERYDAY AT BEDTIME   DONEPEZIL (ARICEPT) 10 MG TABLET    Take 1 tablet (10 mg total) by mouth at bedtime.   FERROUS SULFATE 325 (65 FE) MG EC TABLET    Take 325 mg by mouth in the morning and at bedtime.   FLUTICASONE (FLONASE) 50 MCG/ACT NASAL SPRAY    SPRAY 1 SPRAY INTO BOTH NOSTRILS DAILY.   FLUTICASONE-UMECLIDIN-VILANT (TRELEGY ELLIPTA) 100-62.5-25 MCG/ACT AEPB    TAKE 1 PUFF BY MOUTH EVERY DAY   IPRATROPIUM (ATROVENT) 0.03 % NASAL SPRAY    Place 2 sprays into both nostrils 3 (three) times daily as needed for rhinitis.   LEVOTHYROXINE (SYNTHROID) 88 MCG TABLET    TAKE 1 TABLET BY MOUTH EVERY MORNING.   LOSARTAN (COZAAR) 100 MG TABLET    TAKE 1 TABLET BY MOUTH EVERY DAY   METFORMIN (GLUCOPHAGE) 500 MG TABLET    TAKE 1 TABLET BY MOUTH 2 TIMES DAILY WITH A MEAL.   MONTELUKAST (SINGULAIR) 10 MG TABLET    TAKE 1 TABLET BY MOUTH EVERYDAY AT BEDTIME   MULTIPLE VITAMIN (MULTIVITAMIN WITH MINERALS) TABS TABLET    Take 1 tablet by mouth 2 (two) times daily.   PANTOPRAZOLE (PROTONIX) 40 MG TABLET    TAKE 1 TABLET BY MOUTH EVERY DAY   ROPINIROLE (REQUIP) 1 MG TABLET    TAKE 1 TABLET BY MOUTH AT BEDTIME.   ROSUVASTATIN (CRESTOR) 10 MG TABLET    TAKE 2 TABLETS BY MOUTH EVERY DAY   TACROLIMUS (PROGRAF) 1 MG CAPSULE    Take 1 mg by mouth 2 (two) times daily.   Modified Medications   No medications on file  Discontinued Medications   No medications on file    Physical Exam:  Vitals:   10/03/21 1023  BP: 108/82  Pulse: 80  Temp: (!) 96.8 F (36 C)  TempSrc: Temporal  SpO2: 94%  Weight: 219 lb (99.3 kg)  Height: 5' 6.5" (1.689 m)   Body mass index is 34.82 kg/m. Wt  Readings from Last 3 Encounters:  10/03/21 219 lb (99.3 kg)  09/30/21 222 lb (100.7 kg)  08/04/21 217 lb 6.4 oz (98.6 kg)    Physical Exam Constitutional:      Appearance: Normal appearance.  Pulmonary:     Effort: Pulmonary effort is normal.  Neurological:     Mental Status: She is alert. Mental status is at baseline.  Psychiatric:        Mood and Affect: Mood normal.     Labs reviewed: Basic Metabolic Panel: Recent Labs    11/27/20 0942 03/03/21 0951 07/22/21 0956  NA 141 141 141  K 4.2 4.2 4.6  CL 102 103 104  CO2 '31 31 29  '$ GLUCOSE 123* 113* 113*  BUN '17 19 16  '$ CREATININE 0.96* 1.02* 0.93  CALCIUM 9.3 9.5 9.2  TSH 1.99  --   --    Liver Function Tests: Recent Labs    11/27/20 0942 03/03/21 0951 07/22/21 0956  AST '12 12 11  '$ ALT '10 11 13  '$ BILITOT 0.3 0.3 0.4  PROT 6.9 7.1 7.1   No results for input(s): "LIPASE", "AMYLASE" in the last 8760 hours. No results for input(s): "AMMONIA" in the last 8760 hours. CBC: Recent Labs    11/27/20 0942 03/03/21 0951 07/22/21 0956  WBC 7.4 10.2 7.7  NEUTROABS 5,062 6,701 5,359  HGB 10.4* 13.2 13.2  HCT 35.6 43.9 42.2  MCV 76.9* 82.8 90.8  PLT 353 306 280   Lipid Panel: Recent Labs    11/27/20 0942 03/03/21 0951  CHOL 112 121  HDL 56 60  LDLCALC 36 39  TRIG 122 139  CHOLHDL 2.0 2.0   TSH: Recent Labs    11/27/20 0942  TSH 1.99   A1C: Lab Results  Component Value Date   HGBA1C 6.2 (H) 07/22/2021     Assessment/Plan 1. Age-related osteoporosis without current pathological fracture -she is interested in starting prolia for osteoporosis,  She has GERD and therefore fosamax may exacerbate symptoms.  -will get prior auth and her scheduled for injection -Recommended to take calcium 600 mg twice daily with Vitamin D 2000 units daily and weight bearing activity 30 mins/5 days a week    Jessee Newnam K. [place], [facility] [phone]

## 2021-10-03 NOTE — Patient Instructions (Signed)
Recommended to take calcium 600 mg twice daily with Vitamin D 2000 units daily and weight bearing activity 30 mins/5 days a week  Will get prior auth for prolia. To call and speak to anita if you do not hear anything after a week.

## 2021-10-16 DIAGNOSIS — C44729 Squamous cell carcinoma of skin of left lower limb, including hip: Secondary | ICD-10-CM | POA: Diagnosis not present

## 2021-10-29 ENCOUNTER — Telehealth: Payer: Self-pay

## 2021-10-29 NOTE — Telephone Encounter (Signed)
Patient called stating she has not heard anything prolia authroization.  I reviewed [facility]) Prolia notebook and it is noted for [name] to Discuss patients prolia authorization with the prolia representative this Friday 10/31/21.   Patient informed to expect a call Friday

## 2021-10-31 NOTE — Telephone Encounter (Signed)
Patient informed that Verification was submitted to insurance and just waiting for authorization.   Patient aware that we will call her to set up an appointment once Authorization received. Agreed.

## 2021-11-07 ENCOUNTER — Telehealth: Payer: Self-pay | Admitting: *Deleted

## 2021-11-07 NOTE — Telephone Encounter (Signed)
Prior Authorization submitted to [facility] [id number].   Spoke with Levada Dy and Initiated PA. Went into determination. Will received a fax in 72 hours with determination.   EAK#:[id number]  Member VO:[id number]

## 2021-11-10 NOTE — Telephone Encounter (Signed)
Received fax from [facility] [phone] stating Prolia has been APPROVED 11/11/2021-02/08/2022  Approved EOC Number: [id number]    Patient notified that Prolia was APPROVED and there is a $80 Copay. Patient scheduled an appointment for 12/19/2021.

## 2021-11-26 ENCOUNTER — Other Ambulatory Visit: Payer: Self-pay | Admitting: Nurse Practitioner

## 2021-11-26 DIAGNOSIS — E039 Hypothyroidism, unspecified: Secondary | ICD-10-CM

## 2021-11-28 ENCOUNTER — Ambulatory Visit (INDEPENDENT_AMBULATORY_CARE_PROVIDER_SITE_OTHER): Payer: Medicare PPO

## 2021-11-28 DIAGNOSIS — Z23 Encounter for immunization: Secondary | ICD-10-CM

## 2021-12-05 ENCOUNTER — Other Ambulatory Visit: Payer: Self-pay | Admitting: Nurse Practitioner

## 2021-12-05 DIAGNOSIS — E119 Type 2 diabetes mellitus without complications: Secondary | ICD-10-CM

## 2021-12-09 DIAGNOSIS — Z5189 Encounter for other specified aftercare: Secondary | ICD-10-CM | POA: Diagnosis not present

## 2021-12-09 DIAGNOSIS — Z85828 Personal history of other malignant neoplasm of skin: Secondary | ICD-10-CM | POA: Diagnosis not present

## 2021-12-09 DIAGNOSIS — C44729 Squamous cell carcinoma of skin of left lower limb, including hip: Secondary | ICD-10-CM | POA: Diagnosis not present

## 2021-12-17 ENCOUNTER — Other Ambulatory Visit: Payer: Self-pay | Admitting: Nurse Practitioner

## 2021-12-17 DIAGNOSIS — G2581 Restless legs syndrome: Secondary | ICD-10-CM

## 2021-12-19 ENCOUNTER — Ambulatory Visit: Payer: Medicare PPO

## 2021-12-19 DIAGNOSIS — M810 Age-related osteoporosis without current pathological fracture: Secondary | ICD-10-CM | POA: Diagnosis not present

## 2021-12-19 MED ORDER — DENOSUMAB 60 MG/ML ~~LOC~~ SOSY
60.0000 mg | PREFILLED_SYRINGE | Freq: Once | SUBCUTANEOUS | Status: AC
  Administered 2021-12-19: 60 mg via SUBCUTANEOUS

## 2021-12-29 DIAGNOSIS — L821 Other seborrheic keratosis: Secondary | ICD-10-CM | POA: Diagnosis not present

## 2021-12-29 DIAGNOSIS — Z85828 Personal history of other malignant neoplasm of skin: Secondary | ICD-10-CM | POA: Diagnosis not present

## 2021-12-29 DIAGNOSIS — C44622 Squamous cell carcinoma of skin of right upper limb, including shoulder: Secondary | ICD-10-CM | POA: Diagnosis not present

## 2021-12-29 DIAGNOSIS — L570 Actinic keratosis: Secondary | ICD-10-CM | POA: Diagnosis not present

## 2021-12-29 DIAGNOSIS — L578 Other skin changes due to chronic exposure to nonionizing radiation: Secondary | ICD-10-CM | POA: Diagnosis not present

## 2021-12-29 DIAGNOSIS — D225 Melanocytic nevi of trunk: Secondary | ICD-10-CM | POA: Diagnosis not present

## 2021-12-29 DIAGNOSIS — D485 Neoplasm of uncertain behavior of skin: Secondary | ICD-10-CM | POA: Diagnosis not present

## 2022-01-13 ENCOUNTER — Other Ambulatory Visit: Payer: Self-pay | Admitting: Pulmonary Disease

## 2022-01-19 ENCOUNTER — Other Ambulatory Visit: Payer: Medicare PPO

## 2022-01-19 DIAGNOSIS — E782 Mixed hyperlipidemia: Secondary | ICD-10-CM | POA: Diagnosis not present

## 2022-01-19 DIAGNOSIS — I10 Essential (primary) hypertension: Secondary | ICD-10-CM

## 2022-01-19 DIAGNOSIS — N1832 Chronic kidney disease, stage 3b: Secondary | ICD-10-CM | POA: Diagnosis not present

## 2022-01-19 DIAGNOSIS — E039 Hypothyroidism, unspecified: Secondary | ICD-10-CM | POA: Diagnosis not present

## 2022-01-19 DIAGNOSIS — E1122 Type 2 diabetes mellitus with diabetic chronic kidney disease: Secondary | ICD-10-CM | POA: Diagnosis not present

## 2022-01-19 DIAGNOSIS — D508 Other iron deficiency anemias: Secondary | ICD-10-CM

## 2022-01-20 LAB — CBC WITH DIFFERENTIAL/PLATELET
Absolute Monocytes: 717 cells/uL (ref 200–950)
Basophils Absolute: 78 cells/uL (ref 0–200)
Basophils Relative: 1.1 %
Eosinophils Absolute: 170 cells/uL (ref 15–500)
Eosinophils Relative: 2.4 %
HCT: 40.3 % (ref 35.0–45.0)
Hemoglobin: 12.8 g/dL (ref 11.7–15.5)
Lymphs Abs: 1910 cells/uL (ref 850–3900)
MCH: 27.6 pg (ref 27.0–33.0)
MCHC: 31.8 g/dL — ABNORMAL LOW (ref 32.0–36.0)
MCV: 87 fL (ref 80.0–100.0)
MPV: 10.6 fL (ref 7.5–12.5)
Monocytes Relative: 10.1 %
Neutro Abs: 4225 cells/uL (ref 1500–7800)
Neutrophils Relative %: 59.5 %
Platelets: 294 10*3/uL (ref 140–400)
RBC: 4.63 10*6/uL (ref 3.80–5.10)
RDW: 14 % (ref 11.0–15.0)
Total Lymphocyte: 26.9 %
WBC: 7.1 10*3/uL (ref 3.8–10.8)

## 2022-01-20 LAB — COMPLETE METABOLIC PANEL WITH GFR
AG Ratio: 1.2 (calc) (ref 1.0–2.5)
ALT: 13 U/L (ref 6–29)
AST: 15 U/L (ref 10–35)
Albumin: 3.9 g/dL (ref 3.6–5.1)
Alkaline phosphatase (APISO): 50 U/L (ref 37–153)
BUN/Creatinine Ratio: 17 (calc) (ref 6–22)
BUN: 17 mg/dL (ref 7–25)
CO2: 30 mmol/L (ref 20–32)
Calcium: 8.3 mg/dL — ABNORMAL LOW (ref 8.6–10.4)
Chloride: 105 mmol/L (ref 98–110)
Creat: 0.98 mg/dL — ABNORMAL HIGH (ref 0.60–0.95)
Globulin: 3.2 g/dL (calc) (ref 1.9–3.7)
Glucose, Bld: 137 mg/dL — ABNORMAL HIGH (ref 65–99)
Potassium: 4.4 mmol/L (ref 3.5–5.3)
Sodium: 141 mmol/L (ref 135–146)
Total Bilirubin: 0.4 mg/dL (ref 0.2–1.2)
Total Protein: 7.1 g/dL (ref 6.1–8.1)
eGFR: 58 mL/min/{1.73_m2} — ABNORMAL LOW (ref >=60)

## 2022-01-20 LAB — LIPID PANEL
Cholesterol: 125 mg/dL (ref <200)
HDL: 63 mg/dL (ref >=50)
LDL Cholesterol (Calc): 39 mg/dL (calc)
Non-HDL Cholesterol (Calc): 62 mg/dL (calc) (ref <130)
Total CHOL/HDL Ratio: 2 (calc) (ref <5.0)
Triglycerides: 145 mg/dL (ref <150)

## 2022-01-20 LAB — HEMOGLOBIN A1C
Hgb A1c MFr Bld: 6.6 % of total Hgb — ABNORMAL HIGH (ref <5.7)
Mean Plasma Glucose: 143 mg/dL
eAG (mmol/L): 7.9 mmol/L

## 2022-01-20 LAB — IRON,TIBC AND FERRITIN PANEL
%SAT: 12 % (calc) — ABNORMAL LOW (ref 16–45)
Ferritin: 11 ng/mL — ABNORMAL LOW (ref 16–288)
Iron: 44 ug/dL — ABNORMAL LOW (ref 45–160)
TIBC: 379 mcg/dL (calc) (ref 250–450)

## 2022-01-20 LAB — TSH: TSH: 2.68 mIU/L (ref 0.40–4.50)

## 2022-01-22 ENCOUNTER — Other Ambulatory Visit: Payer: Self-pay | Admitting: Pulmonary Disease

## 2022-01-26 ENCOUNTER — Ambulatory Visit: Payer: Medicare PPO | Admitting: Nurse Practitioner

## 2022-01-26 ENCOUNTER — Encounter: Payer: Self-pay | Admitting: Nurse Practitioner

## 2022-01-26 VITALS — BP 128/84 | HR 76 | Temp 97.8°F | Ht 66.5 in | Wt 225.0 lb

## 2022-01-26 DIAGNOSIS — G2581 Restless legs syndrome: Secondary | ICD-10-CM | POA: Diagnosis not present

## 2022-01-26 DIAGNOSIS — D6869 Other thrombophilia: Secondary | ICD-10-CM | POA: Diagnosis not present

## 2022-01-26 DIAGNOSIS — D508 Other iron deficiency anemias: Secondary | ICD-10-CM

## 2022-01-26 DIAGNOSIS — G3184 Mild cognitive impairment, so stated: Secondary | ICD-10-CM | POA: Diagnosis not present

## 2022-01-26 DIAGNOSIS — K219 Gastro-esophageal reflux disease without esophagitis: Secondary | ICD-10-CM

## 2022-01-26 DIAGNOSIS — I2782 Chronic pulmonary embolism: Secondary | ICD-10-CM

## 2022-01-26 DIAGNOSIS — E782 Mixed hyperlipidemia: Secondary | ICD-10-CM

## 2022-01-26 DIAGNOSIS — E1122 Type 2 diabetes mellitus with diabetic chronic kidney disease: Secondary | ICD-10-CM | POA: Diagnosis not present

## 2022-01-26 DIAGNOSIS — E039 Hypothyroidism, unspecified: Secondary | ICD-10-CM | POA: Diagnosis not present

## 2022-01-26 DIAGNOSIS — I110 Hypertensive heart disease with heart failure: Secondary | ICD-10-CM | POA: Insufficient documentation

## 2022-01-26 DIAGNOSIS — N1832 Chronic kidney disease, stage 3b: Secondary | ICD-10-CM

## 2022-01-26 MED ORDER — OMEPRAZOLE 40 MG PO CPDR: 40.0000 mg | DELAYED_RELEASE_CAPSULE | Freq: Every day | ORAL | 0 refills | Status: DC

## 2022-01-26 MED ORDER — FERROUS SULFATE 325 (65 FE) MG PO TBEC: 325.0000 mg | DELAYED_RELEASE_TABLET | Freq: Every day | ORAL | 1 refills | Status: DC

## 2022-01-26 NOTE — Progress Notes (Signed)
Careteam: Patient Care Team: [name], NP as PCP - General (Geriatric Medicine) Paralee Cancel, MD as Consulting Physician (Orthopedic Surgery) Jari Pigg, MD as Consulting Physician (Dermatology) Biagio Quint, MD as Referring Physician (Nephrology) Chesley Mires, MD as Consulting Physician (Pulmonary Disease)  PLACE OF SERVICE:  [place] Directive information Does Patient Have a Medical Advance Directive?: Yes, Type of Advance Directive: [place];Living will, Does patient want to make changes to medical advance directive?: No - Patient declined  No Known Allergies  Chief Complaint  Patient presents with   Medical Management of Chronic Issues    6 month follow-up and discuss recent labs (copy printed).Patient with ongoing long-term cough, ? Related to Losartan. Vomiting episodes at night (off/on), possibly related to drainage).  Discuss need for diabetic kidney evaluation, td/tdap, eye exam and covid boosters or post pone if patient refuses. Discuss acid reflux regimen.      HPI: Patient is a 81 y.o. female for routine follow up.   Asthma- continues on trelegy  GERD- she does not feel like her medication is a good as it used to be. She will burp at night and acid come into mouth. She was on omeprazole prior.   Seeing her nephrologist tomorrow.   Anemia- iron level trending down- she was on supplement but stopped 6 months ago when it was normal.   RLS- stable worse in the afternoon when she wants to nap.   Hypothyroid- TSH at goal   Review of Systems:  Review of Systems  Constitutional:  Negative for chills, fever and weight loss.  HENT:  Negative for tinnitus.   Respiratory:  Negative for cough, sputum production and shortness of breath.   Cardiovascular:  Negative for chest pain, palpitations and leg swelling.  Gastrointestinal:  Negative for abdominal pain, constipation, diarrhea and heartburn.  Genitourinary:  Negative  for dysuria, frequency and urgency.  Musculoskeletal:  Negative for back pain, falls, joint pain and myalgias.  Skin: Negative.   Neurological:  Negative for dizziness and headaches.  Psychiatric/Behavioral:  Negative for depression and memory loss. The patient does not have insomnia.     Past Medical History:  Diagnosis Date   Actinic keratosis    Arthritis    Asthma    Back injury 2019   Fracture   Basal cell carcinoma    COPD (chronic obstructive pulmonary disease) (HCC)    GERD (gastroesophageal reflux disease)    H/O blood clots 2020   Lung, Per [place] New Patient Packet   H/O mammogram 2022   Per [place] New Patient Packet   Heart murmur    hx of in childhood    High cholesterol    Hyperlipidemia    Hypertension    Hypothyroidism    Kidney disease    Per [employer] New Patient Packet   MCNS (minimal change nephrotic syndrome)    OSA (obstructive sleep apnea) 08/29/2019   Osteopenia    Shortness of breath dyspnea    on exertion    Squamous cell carcinoma    Thyroid disease    Per [facility] New Patient Packet   Past Surgical History:  Procedure Laterality Date   ABDOMINAL HYSTERECTOMY     APPENDECTOMY     BILATERAL SALPINGOOPHORECTOMY     Ovarian Cysts    COLONOSCOPY  2001   Dr.Mann, Per [facility] New Patient Packet   CONVERSION TO TOTAL KNEE Right 07/10/2014   Procedure: RIGHT CONVERSION OF PARTIAL KNEE TO TOTAL KNEE;  Surgeon: Paralee Cancel, MD;  Location: [facility];  Service: Orthopedics;  Laterality: Right;   IR ANGIOGRAM PULMONARY BILATERAL SELECTIVE  02/08/2019   IR ANGIOGRAM SELECTIVE EACH ADDITIONAL VESSEL  02/08/2019   IR ANGIOGRAM SELECTIVE EACH ADDITIONAL VESSEL  02/08/2019   IR INFUSION THROMBOL ARTERIAL INITIAL (MS)  02/08/2019   IR INFUSION THROMBOL ARTERIAL INITIAL (MS)  02/08/2019   IR THROMB F/U EVAL ART/VEN FINAL DAY (MS)  02/09/2019   IR US GUIDE VASC ACCESS RIGHT  02/08/2019   MEDIAL PARTIAL KNEE REPLACEMENT Bilateral    MOHS SURGERY     x 2   RENAL BIOPSY     x 2    REPLACEMENT TOTAL KNEE  2012   Per [employer] New Patient Packet   ROTATOR CUFF REPAIR Left 2008   Social History:   reports that she quit smoking about 49 years ago. Her smoking use included cigarettes. She has a 37.50 pack-year smoking history. She has never used smokeless tobacco. She reports current alcohol use of about 0.5 standard drinks of alcohol per week. She reports that she does not use drugs.  Family History  Problem Relation Age of Onset   COPD Mother        Husband Smoked    Lymphoma Father    Hypertension Sister    Scoliosis Daughter    Stroke Maternal Grandfather    Rheumatic fever Paternal Grandmother    Heart disease Paternal Grandfather     Medications: Patient's Medications  New Prescriptions   No medications on file  Previous Medications   ALBUTEROL (VENTOLIN HFA) 108 (90 BASE) MCG/ACT INHALER    Inhale 1 puff into the lungs as needed for wheezing or shortness of breath.   CALCIUM CARB-CHOLECALCIFEROL (CALCIUM-VITAMIN D) 600-400 MG-UNIT TABS    Take 1 tablet by mouth daily.   CITALOPRAM (CELEXA) 20 MG TABLET    TAKE 1 TABLET BY MOUTH EVERYDAY AT BEDTIME   DONEPEZIL (ARICEPT) 10 MG TABLET    Take 1 tablet (10 mg total) by mouth at bedtime.   ELIQUIS 5 MG TABS TABLET    TAKE 1 TABLET BY MOUTH TWICE A DAY   FERROUS SULFATE 325 (65 FE) MG EC TABLET    Take 325 mg by mouth in the morning and at bedtime.   FLUTICASONE (FLONASE) 50 MCG/ACT NASAL SPRAY    SPRAY 1 SPRAY INTO BOTH NOSTRILS DAILY.   FLUTICASONE-UMECLIDIN-VILANT (TRELEGY ELLIPTA) 100-62.5-25 MCG/ACT AEPB    TAKE 1 PUFF BY MOUTH EVERY DAY   IPRATROPIUM (ATROVENT) 0.03 % NASAL SPRAY    PLACE 2 SPRAYS INTO BOTH NOSTRILS 3 (THREE) TIMES DAILY AS NEEDED FOR RHINITIS.   LEVOTHYROXINE (SYNTHROID) 88 MCG TABLET    TAKE 1 TABLET BY MOUTH EVERY DAY IN THE MORNING   LOSARTAN (COZAAR) 100 MG TABLET    TAKE 1 TABLET BY MOUTH EVERY DAY   METFORMIN (GLUCOPHAGE) 500 MG TABLET    TAKE 1 TABLET BY MOUTH TWICE A DAY WITH MEALS    MONTELUKAST (SINGULAIR) 10 MG TABLET    TAKE 1 TABLET BY MOUTH EVERYDAY AT BEDTIME   MULTIPLE VITAMIN (MULTIVITAMIN WITH MINERALS) TABS TABLET    Take 1 tablet by mouth daily.   PANTOPRAZOLE (PROTONIX) 40 MG TABLET    TAKE 1 TABLET BY MOUTH EVERY DAY   ROPINIROLE (REQUIP) 1 MG TABLET    TAKE 1 TABLET BY MOUTH EVERYDAY AT BEDTIME   ROSUVASTATIN (CRESTOR) 10 MG TABLET    TAKE 2 TABLETS BY MOUTH EVERY DAY  TACROLIMUS (PROGRAF) 1 MG CAPSULE    Take 1 mg by mouth 2 (two) times daily.   Modified Medications   No medications on file  Discontinued Medications   No medications on file    Physical Exam:  Vitals:   01/26/22 1021  BP: 128/84  Pulse: 76  Temp: 97.8 F (36.6 C)  TempSrc: Temporal  SpO2: 94%  Weight: 225 lb (102.1 kg)  Height: 5' 6.5" (1.689 m)   Body mass index is 35.77 kg/m. Wt Readings from Last 3 Encounters:  01/26/22 225 lb (102.1 kg)  10/03/21 219 lb (99.3 kg)  09/30/21 222 lb (100.7 kg)    Physical Exam Constitutional:      General: She is not in acute distress.    Appearance: She is well-developed. She is not diaphoretic.  HENT:     Head: Normocephalic and atraumatic.     Mouth/Throat:     Pharynx: No oropharyngeal exudate.  Eyes:     Conjunctiva/sclera: Conjunctivae normal.     Pupils: Pupils are equal, round, and reactive to light.  Cardiovascular:     Rate and Rhythm: Normal rate and regular rhythm.     Heart sounds: Normal heart sounds.  Pulmonary:     Effort: Pulmonary effort is normal.     Breath sounds: Normal breath sounds.  Abdominal:     General: Bowel sounds are normal.     Palpations: Abdomen is soft.  Musculoskeletal:     Cervical back: Normal range of motion and neck supple.     Right lower leg: No edema.     Left lower leg: No edema.  Skin:    General: Skin is warm and dry.  Neurological:     Mental Status: She is alert.  Psychiatric:        Mood and Affect: Mood normal.     Labs reviewed: Basic Metabolic Panel: Recent  Labs    03/03/21 0951 07/22/21 0956 01/19/22 0851  NA 141 141 141  K 4.2 4.6 4.4  CL 103 104 105  CO2 '31 29 30  '$ GLUCOSE 113* 113* 137*  BUN '19 16 17  '$ CREATININE 1.02* 0.93 0.98*  CALCIUM 9.5 9.2 8.3*  TSH  --   --  2.68   Liver Function Tests: Recent Labs    03/03/21 0951 07/22/21 0956 01/19/22 0851  AST '12 11 15  '$ ALT '11 13 13  '$ BILITOT 0.3 0.4 0.4  PROT 7.1 7.1 7.1   No results for input(s): "LIPASE", "AMYLASE" in the last 8760 hours. No results for input(s): "AMMONIA" in the last 8760 hours. CBC: Recent Labs    03/03/21 0951 07/22/21 0956 01/19/22 0851  WBC 10.2 7.7 7.1  NEUTROABS 6,701 5,359 4,225  HGB 13.2 13.2 12.8  HCT 43.9 42.2 40.3  MCV 82.8 90.8 87.0  PLT 306 280 294   Lipid Panel: Recent Labs    03/03/21 0951 01/19/22 0851  CHOL 121 125  HDL 60 63  LDLCALC 39 39  TRIG 139 145  CHOLHDL 2.0 2.0   TSH: Recent Labs    01/19/22 0851  TSH 2.68   A1C: Lab Results  Component Value Date   HGBA1C 6.6 (H) 01/19/2022     Assessment/Plan 1. Gastroesophageal reflux disease without esophagitis -reports omeprazole worked better for her in the past, will change protonix to omeprazole. Continue lifestyle modifications as well.  - omeprazole (PRILOSEC) 40 MG capsule; Take 1 capsule (40 mg total) by mouth daily.  Dispense: 90 capsule; Refill: 0  2.  MCI (mild cognitive impairment) -stable, continue to monitor.  -continues on aricept  3. Iron deficiency anemia secondary to inadequate dietary iron intake -iron levels trending down, hgb still stable. Will add once daily iron to regimen. - ferrous sulfate 325 (65 FE) MG EC tablet; Take 1 tablet (325 mg total) by mouth daily with breakfast.  Dispense: 90 tablet; Refill: 1  4. Hypertensive heart disease with heart failure (HCC) Blood pressure well controlled, goal bp <140/90 Continue current medications and dietary modifications follow metabolic panel  5. Type 2 diabetes mellitus with stage 3b chronic  kidney disease, without long-term current use of insulin (HCC) -A1c at goal, continue medication with dietary compliance, routine foot care/monitoring and to keep up with diabetic eye exams through ophthalmology    6. Mixed hyperlipidemia -LDL at goal  crestor, continue medication with dietary modifications.   7. Acquired hypothyroidism -continues on   8. RLS (restless legs syndrome) Controlled with requip  10. 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 Other chronic pulmonary embolism without acute cor pulmonale (HCC) -contiues on eliquis for lifetime. No worsening shortness of breath, chest pains.  No abnormal bruising or bleeding reported.  Return in about 6 months (around 07/28/2022) for routine follow up.  lab work with appt . Carlos American. [place], [facility] [phone]

## 2022-01-26 NOTE — Patient Instructions (Addendum)
STOP protonix,  Start omeprazole daily -- to notify if acid worsens or does not improve.   Restart iron- once daily at breakfast.

## 2022-01-27 DIAGNOSIS — I129 Hypertensive chronic kidney disease with stage 1 through stage 4 chronic kidney disease, or unspecified chronic kidney disease: Secondary | ICD-10-CM | POA: Diagnosis not present

## 2022-01-27 DIAGNOSIS — Z87891 Personal history of nicotine dependence: Secondary | ICD-10-CM | POA: Diagnosis not present

## 2022-01-27 DIAGNOSIS — E1122 Type 2 diabetes mellitus with diabetic chronic kidney disease: Secondary | ICD-10-CM | POA: Diagnosis not present

## 2022-01-27 DIAGNOSIS — N1831 Chronic kidney disease, stage 3a: Secondary | ICD-10-CM | POA: Diagnosis not present

## 2022-01-27 DIAGNOSIS — Z7901 Long term (current) use of anticoagulants: Secondary | ICD-10-CM | POA: Diagnosis not present

## 2022-01-27 DIAGNOSIS — Z86711 Personal history of pulmonary embolism: Secondary | ICD-10-CM | POA: Diagnosis not present

## 2022-01-27 DIAGNOSIS — Z7984 Long term (current) use of oral hypoglycemic drugs: Secondary | ICD-10-CM | POA: Diagnosis not present

## 2022-02-12 ENCOUNTER — Other Ambulatory Visit: Payer: Self-pay | Admitting: Nurse Practitioner

## 2022-02-12 DIAGNOSIS — F419 Anxiety disorder, unspecified: Secondary | ICD-10-CM

## 2022-02-12 DIAGNOSIS — G3184 Mild cognitive impairment, so stated: Secondary | ICD-10-CM

## 2022-02-12 DIAGNOSIS — C44622 Squamous cell carcinoma of skin of right upper limb, including shoulder: Secondary | ICD-10-CM | POA: Diagnosis not present

## 2022-02-19 ENCOUNTER — Other Ambulatory Visit: Payer: Self-pay | Admitting: Nurse Practitioner

## 2022-02-19 DIAGNOSIS — E782 Mixed hyperlipidemia: Secondary | ICD-10-CM

## 2022-03-12 ENCOUNTER — Other Ambulatory Visit: Payer: Self-pay | Admitting: Nurse Practitioner

## 2022-03-12 ENCOUNTER — Other Ambulatory Visit: Payer: Self-pay | Admitting: Pulmonary Disease

## 2022-03-12 DIAGNOSIS — K219 Gastro-esophageal reflux disease without esophagitis: Secondary | ICD-10-CM

## 2022-03-12 DIAGNOSIS — J449 Chronic obstructive pulmonary disease, unspecified: Secondary | ICD-10-CM

## 2022-03-16 ENCOUNTER — Telehealth: Payer: Self-pay

## 2022-03-16 DIAGNOSIS — D508 Other iron deficiency anemias: Secondary | ICD-10-CM

## 2022-03-16 NOTE — Telephone Encounter (Signed)
Patient called and left message on clinical intake voicemail. She states that she was told that her iron was low ans has been taking an iron supplement. Despite taking the iron supplement she still feels like she has no energy and that she just wants to sleep all the time. She would like to know if she can come have blood work done again.  Message routed to Sherrie Mustache, NP

## 2022-03-17 NOTE — Telephone Encounter (Signed)
Will place orders to recheck labs, would also recommend completing and ifob test.  Will need to schedule lab appt.

## 2022-03-18 ENCOUNTER — Other Ambulatory Visit: Payer: Self-pay

## 2022-03-18 DIAGNOSIS — D508 Other iron deficiency anemias: Secondary | ICD-10-CM

## 2022-03-18 NOTE — Telephone Encounter (Signed)
Lab appointment made and ifob order placed.

## 2022-03-23 ENCOUNTER — Telehealth: Payer: Self-pay

## 2022-03-23 ENCOUNTER — Other Ambulatory Visit: Payer: Self-pay

## 2022-03-23 ENCOUNTER — Other Ambulatory Visit: Payer: Medicare PPO

## 2022-03-23 DIAGNOSIS — D508 Other iron deficiency anemias: Secondary | ICD-10-CM | POA: Diagnosis not present

## 2022-03-23 NOTE — Telephone Encounter (Signed)
Outgoing call placed to patient to inform her that disability placard is complete and ready for pick-up. Someone picked up the phone and it appears they hung up. We will need to try to reach patient again.   Form is housed in the clinical intake area

## 2022-03-23 NOTE — Telephone Encounter (Signed)
Clinical intake for was received from the administrative staff, patient states I need my handicap sticker renewed.   Form filled out and placed in [name], NP review and sign folder. We will need to call patient once complete to find out how she would like to retrieve form. Options include:  Pick up Mail Fax

## 2022-03-24 LAB — CBC WITH DIFFERENTIAL/PLATELET
Absolute Monocytes: 1014 cells/uL — ABNORMAL HIGH (ref 200–950)
Basophils Absolute: 56 cells/uL (ref 0–200)
Basophils Relative: 0.6 %
Eosinophils Absolute: 158 cells/uL (ref 15–500)
Eosinophils Relative: 1.7 %
HCT: 41.7 % (ref 35.0–45.0)
Hemoglobin: 13.2 g/dL (ref 11.7–15.5)
Lymphs Abs: 1544 cells/uL (ref 850–3900)
MCH: 27.2 pg (ref 27.0–33.0)
MCHC: 31.7 g/dL — ABNORMAL LOW (ref 32.0–36.0)
MCV: 86 fL (ref 80.0–100.0)
MPV: 10.6 fL (ref 7.5–12.5)
Monocytes Relative: 10.9 %
Neutro Abs: 6529 cells/uL (ref 1500–7800)
Neutrophils Relative %: 70.2 %
Platelets: 302 10*3/uL (ref 140–400)
RBC: 4.85 10*6/uL (ref 3.80–5.10)
RDW: 14.2 % (ref 11.0–15.0)
Total Lymphocyte: 16.6 %
WBC: 9.3 10*3/uL (ref 3.8–10.8)

## 2022-03-24 LAB — IRON,TIBC AND FERRITIN PANEL
%SAT: 16 % (calc) (ref 16–45)
Ferritin: 11 ng/mL — ABNORMAL LOW (ref 16–288)
Iron: 64 ug/dL (ref 45–160)
TIBC: 392 mcg/dL (calc) (ref 250–450)

## 2022-03-24 NOTE — Telephone Encounter (Signed)
Patient notified and will pick up tomorrow.  Copy sent to scanning.  Form left up front in drawer for pick up.

## 2022-04-03 ENCOUNTER — Other Ambulatory Visit: Payer: Self-pay | Admitting: Nurse Practitioner

## 2022-04-03 DIAGNOSIS — D508 Other iron deficiency anemias: Secondary | ICD-10-CM | POA: Diagnosis not present

## 2022-04-07 LAB — FECAL GLOBIN BY IMMUNOCHEMISTRY
FECAL GLOBIN RESULT:: DETECTED — AB
MICRO NUMBER:: 14614124
SPECIMEN QUALITY:: ADEQUATE

## 2022-04-08 DIAGNOSIS — D225 Melanocytic nevi of trunk: Secondary | ICD-10-CM | POA: Diagnosis not present

## 2022-04-08 DIAGNOSIS — L578 Other skin changes due to chronic exposure to nonionizing radiation: Secondary | ICD-10-CM | POA: Diagnosis not present

## 2022-04-08 DIAGNOSIS — Z85828 Personal history of other malignant neoplasm of skin: Secondary | ICD-10-CM | POA: Diagnosis not present

## 2022-04-08 DIAGNOSIS — D239 Other benign neoplasm of skin, unspecified: Secondary | ICD-10-CM | POA: Diagnosis not present

## 2022-04-08 DIAGNOSIS — D2262 Melanocytic nevi of left upper limb, including shoulder: Secondary | ICD-10-CM | POA: Diagnosis not present

## 2022-04-08 DIAGNOSIS — D485 Neoplasm of uncertain behavior of skin: Secondary | ICD-10-CM | POA: Diagnosis not present

## 2022-04-08 DIAGNOSIS — L570 Actinic keratosis: Secondary | ICD-10-CM | POA: Diagnosis not present

## 2022-04-08 DIAGNOSIS — L820 Inflamed seborrheic keratosis: Secondary | ICD-10-CM | POA: Diagnosis not present

## 2022-04-08 DIAGNOSIS — L814 Other melanin hyperpigmentation: Secondary | ICD-10-CM | POA: Diagnosis not present

## 2022-04-08 DIAGNOSIS — L821 Other seborrheic keratosis: Secondary | ICD-10-CM | POA: Diagnosis not present

## 2022-04-10 ENCOUNTER — Ambulatory Visit: Payer: Medicare PPO | Admitting: Nurse Practitioner

## 2022-04-10 ENCOUNTER — Encounter: Payer: Self-pay | Admitting: Nurse Practitioner

## 2022-04-10 VITALS — BP 134/82 | HR 89 | Temp 97.9°F | Ht 66.5 in | Wt 224.0 lb

## 2022-04-10 DIAGNOSIS — E119 Type 2 diabetes mellitus without complications: Secondary | ICD-10-CM

## 2022-04-10 DIAGNOSIS — E1122 Type 2 diabetes mellitus with diabetic chronic kidney disease: Secondary | ICD-10-CM

## 2022-04-10 DIAGNOSIS — R197 Diarrhea, unspecified: Secondary | ICD-10-CM | POA: Diagnosis not present

## 2022-04-10 DIAGNOSIS — N1832 Chronic kidney disease, stage 3b: Secondary | ICD-10-CM

## 2022-04-10 DIAGNOSIS — D508 Other iron deficiency anemias: Secondary | ICD-10-CM

## 2022-04-10 DIAGNOSIS — J449 Chronic obstructive pulmonary disease, unspecified: Secondary | ICD-10-CM | POA: Diagnosis not present

## 2022-04-10 DIAGNOSIS — K219 Gastro-esophageal reflux disease without esophagitis: Secondary | ICD-10-CM

## 2022-04-10 MED ORDER — METFORMIN HCL 500 MG PO TABS: 500.0000 mg | ORAL_TABLET | Freq: Every day | ORAL | 3 refills | Status: DC

## 2022-04-10 MED ORDER — PANTOPRAZOLE SODIUM 40 MG PO TBEC: 40.0000 mg | DELAYED_RELEASE_TABLET | Freq: Every day | ORAL | 3 refills | Status: DC

## 2022-04-10 NOTE — Progress Notes (Signed)
Careteam: Patient Care Team: [name], NP as PCP - General (Geriatric Medicine) [name], MD as Consulting Physician (Orthopedic Surgery) [name], MD as Consulting Physician (Dermatology) [name], MD as Referring Physician (Nephrology) [name], MD as Consulting Physician (Pulmonary Disease)  PLACE OF SERVICE:  [facility]  Advanced Directive information    No Known Allergies  Chief Complaint  Patient presents with   Acute Visit    Fatigue and discuss recent labs. Foot exam today. AWV pending for 04/16/22. Discuss crestor dosing      HPI: Patient is a 82 y.o. female here for f/u of labs.   Patient's FOBT was positive on 04/03/22. She denies blood in stool and blood in urine. She is not constipated and she seems to be having a lot of diarrhea due to the metformin, she thinks. She has had diarrhea ever since starting it 2 years ago but when she was prescribed iron it sort of offset the diarrhea. She then started taking stool softeners and has been taking this since. She does take metformin with food.   She saw Dr. Loreta Ave for her past colonoscopies and cologuards, but the last one was 6 years ago. She denies hx of hemorrhoids. Reports no constipation.   Fatigue for about a month. Takes melatonin at night to sleep and sleeps really well but also napping during the day 3 hours in the afternoon. She has COPD and has had a persistent cough, especially at night, and has an appointment coming up with the pulmonary doctor.   Denies any swelling in hands or feet.  Denies feeling lightheaded or dizzy.  She drinks a lot of water and eats decently well, she lives with her daughter who is present at her appointment today.    Review of Systems:  Review of Systems  Constitutional:  Negative for chills, fever, malaise/fatigue and weight loss.  HENT:  Negative for congestion and sore throat.   Eyes:  Negative for blurred vision.  Respiratory:  Negative for cough,  shortness of breath and wheezing.   Cardiovascular:  Negative for chest pain, palpitations and leg swelling.  Gastrointestinal:  Negative for abdominal pain, blood in stool, constipation, diarrhea, heartburn, nausea and vomiting.  Genitourinary:  Negative for dysuria, frequency, hematuria and urgency.  Musculoskeletal:  Negative for falls and joint pain.  Skin:  Negative for rash.  Neurological:  Negative for dizziness, tingling and headaches.  Endo/Heme/Allergies:  Negative for polydipsia.  Psychiatric/Behavioral:  Negative for depression. The patient is not nervous/anxious.   All other systems reviewed and are negative.  Past Medical History:  Diagnosis Date   Actinic keratosis    Arthritis    Asthma    Back injury 2019   Fracture   Basal cell carcinoma    COPD (chronic obstructive pulmonary disease) (HCC)    GERD (gastroesophageal reflux disease)    H/O blood clots 2020   Lung, Per [facility] New Patient Packet   H/O mammogram 2022   Per [facility] New Patient Packet   Heart murmur    hx of in childhood    High cholesterol    Hyperlipidemia    Hypertension    Hypothyroidism    Kidney disease    Per [facility] New Patient Packet   MCNS (minimal change nephrotic syndrome)    OSA (obstructive sleep apnea) 08/29/2019   Osteopenia    Shortness of breath dyspnea    on exertion    Squamous cell carcinoma    Thyroid disease  Per [facility] New Patient Packet   Past Surgical History:  Procedure Laterality Date   ABDOMINAL HYSTERECTOMY     APPENDECTOMY     BILATERAL SALPINGOOPHORECTOMY     Ovarian Cysts    COLONOSCOPY  2001   Dr.Mann, Per [facility] New Patient Packet   CONVERSION TO TOTAL KNEE Right 07/10/2014   Procedure: RIGHT CONVERSION OF PARTIAL KNEE TO TOTAL KNEE;  Surgeon: [name], MD;  Location: [facility];  Service: Orthopedics;  Laterality: Right;   IR ANGIOGRAM PULMONARY BILATERAL SELECTIVE  02/08/2019   IR ANGIOGRAM SELECTIVE EACH ADDITIONAL VESSEL  02/08/2019   IR ANGIOGRAM SELECTIVE  EACH ADDITIONAL VESSEL  02/08/2019   IR INFUSION THROMBOL ARTERIAL INITIAL (MS)  02/08/2019   IR INFUSION THROMBOL ARTERIAL INITIAL (MS)  02/08/2019   IR THROMB F/U EVAL ART/VEN FINAL DAY (MS)  02/09/2019   IR US GUIDE VASC ACCESS RIGHT  02/08/2019   MEDIAL PARTIAL KNEE REPLACEMENT Bilateral    MOHS SURGERY     x 2   RENAL BIOPSY     x 2   REPLACEMENT TOTAL KNEE  2012   Per [facility] New Patient Packet   ROTATOR CUFF REPAIR Left 2008   Social History:   reports that she quit smoking about 49 years ago. Her smoking use included cigarettes. She has a 37.50 pack-year smoking history. She has never used smokeless tobacco. She reports current alcohol use of about 0.5 standard drinks of alcohol per week. She reports that she does not use drugs.  Family History  Problem Relation Age of Onset   COPD Mother        Husband Smoked    Lymphoma Father    Hypertension Sister    Scoliosis Daughter    Stroke Maternal Grandfather    Rheumatic fever Paternal Grandmother    Heart disease Paternal Grandfather     Medications: Patient's Medications  New Prescriptions   PANTOPRAZOLE (PROTONIX) 40 MG TABLET    Take 1 tablet (40 mg total) by mouth daily.  Previous Medications   ALBUTEROL (VENTOLIN HFA) 108 (90 BASE) MCG/ACT INHALER    Inhale 1 puff into the lungs as needed for wheezing or shortness of breath.   CALCIUM CARB-CHOLECALCIFEROL (CALCIUM-VITAMIN D) 600-400 MG-UNIT TABS    Take 1 tablet by mouth daily.   CITALOPRAM (CELEXA) 20 MG TABLET    TAKE 1 TABLET BY MOUTH EVERYDAY AT BEDTIME   DONEPEZIL (ARICEPT) 10 MG TABLET    TAKE 1 TABLET BY MOUTH EVERYDAY AT BEDTIME   ELIQUIS 5 MG TABS TABLET    TAKE 1 TABLET BY MOUTH TWICE A DAY   FERROUS SULFATE 325 (65 FE) MG EC TABLET    Take 1 tablet (325 mg total) by mouth daily with breakfast.   FLUTICASONE (FLONASE) 50 MCG/ACT NASAL SPRAY    INSTILL 1 SPRAY INTO BOTH NOSTRILS DAILY   FLUTICASONE-UMECLIDIN-VILANT (TRELEGY ELLIPTA) 100-62.5-25 MCG/ACT AEPB     TAKE 1 PUFF BY MOUTH EVERY DAY   GUAIFENESIN (MUCINEX) 600 MG 12 HR TABLET    Take 1,200 mg by mouth daily.   IPRATROPIUM (ATROVENT) 0.03 % NASAL SPRAY    SPRAY 2 SPRAYS INTO EACH NOSTRIL 3 TIMES A DAY AS NEEDED FOR RHINITIS   LEVOTHYROXINE (SYNTHROID) 88 MCG TABLET    TAKE 1 TABLET BY MOUTH EVERY DAY IN THE MORNING   LOSARTAN (COZAAR) 100 MG TABLET    TAKE 1 TABLET BY MOUTH EVERY DAY   MONTELUKAST (SINGULAIR) 10 MG TABLET    TAKE 1  TABLET BY MOUTH EVERYDAY AT BEDTIME   MULTIPLE VITAMIN (MULTIVITAMIN WITH MINERALS) TABS TABLET    Take 1 tablet by mouth daily.   ROPINIROLE (REQUIP) 1 MG TABLET    TAKE 1 TABLET BY MOUTH EVERYDAY AT BEDTIME   ROSUVASTATIN (CRESTOR) 10 MG TABLET    Take 2 tablets (20 mg total) by mouth daily. E78.2   TACROLIMUS (PROGRAF) 1 MG CAPSULE    Take 1 mg by mouth 2 (two) times daily.   Modified Medications   Modified Medication Previous Medication   METFORMIN (GLUCOPHAGE) 500 MG TABLET metFORMIN (GLUCOPHAGE) 500 MG tablet      Take 1 tablet (500 mg total) by mouth daily with supper.    TAKE 1 TABLET BY MOUTH TWICE A DAY WITH MEALS  Discontinued Medications   OMEPRAZOLE (PRILOSEC) 40 MG CAPSULE    TAKE 1 CAPSULE (40 MG TOTAL) BY MOUTH DAILY.    Physical Exam:  Vitals:   04/10/22 0847  BP: 134/82  Pulse: 89  Temp: 97.9 F (36.6 C)  TempSrc: Temporal  SpO2: 92%  Weight: 224 lb (101.6 kg)  Height: 5' 6.5" (1.689 m)   Body mass index is 35.61 kg/m. Wt Readings from Last 3 Encounters:  04/10/22 224 lb (101.6 kg)  01/26/22 225 lb (102.1 kg)  10/03/21 219 lb (99.3 kg)    Physical Exam Vitals reviewed.  Constitutional:      General: She is not in acute distress.    Appearance: Normal appearance.  Cardiovascular:     Rate and Rhythm: Normal rate and regular rhythm.  Pulmonary:     Effort: No respiratory distress.     Breath sounds: Normal breath sounds.  Abdominal:     General: Bowel sounds are normal. There is no distension.     Palpations: Abdomen is  soft. There is no mass.     Tenderness: There is no abdominal tenderness. There is no guarding.  Genitourinary:    Rectum: Normal.  Musculoskeletal:     Cervical back: Neck supple.  Lymphadenopathy:     Cervical: No cervical adenopathy.  Skin:    General: Skin is warm and dry.  Neurological:     Mental Status: She is alert and oriented to person, place, and time.  Psychiatric:        Mood and Affect: Mood normal.     Labs reviewed: Basic Metabolic Panel: Recent Labs    07/22/21 0956 01/19/22 0851  NA 141 141  K 4.6 4.4  CL 104 105  CO2 29 30  GLUCOSE 113* 137*  BUN 16 17  CREATININE 0.93 0.98*  CALCIUM 9.2 8.3*  TSH  --  2.68   Liver Function Tests: Recent Labs    07/22/21 0956 01/19/22 0851  AST 11 15  ALT 13 13  BILITOT 0.4 0.4  PROT 7.1 7.1   No results for input(s): "LIPASE", "AMYLASE" in the last 8760 hours. No results for input(s): "AMMONIA" in the last 8760 hours. CBC: Recent Labs    07/22/21 0956 01/19/22 0851 03/23/22 1341  WBC 7.7 7.1 9.3  NEUTROABS 5,359 4,225 6,529  HGB 13.2 12.8 13.2  HCT 42.2 40.3 41.7  MCV 90.8 87.0 86.0  PLT 280 294 302   Lipid Panel: Recent Labs    01/19/22 0851  CHOL 125  HDL 63  LDLCALC 39  TRIG 145  CHOLHDL 2.0   TSH: Recent Labs    01/19/22 0851  TSH 2.68   A1C: Lab Results  Component Value Date  HGBA1C 6.6 (H) 01/19/2022     Assessment/Plan 1. Type 2 diabetes mellitus with stage 3b chronic kidney disease, without long-term current use of insulin (HCC) Patient's A1C at goal, and metformin seems to be causing significant diarrhea. Cut metformin back to once a day with a meal. Reassess at next visit. Adjust medications as needed for GFR and encouraged patient to maintain adequate hydration to keep kidneys happy. - metFORMIN (GLUCOPHAGE) 500 MG tablet; Take 1 tablet (500 mg total) by mouth daily with supper.  Dispense: 180 tablet; Refill: 3  3. Gastroesophageal reflux disease without  esophagitis Pt reports having some cough and reflux-type symptoms; she has been on omeprazole. Trial pantoprazole to see if this improves.  - pantoprazole (PROTONIX) 40 MG tablet; Take 1 tablet (40 mg total) by mouth daily.  Dispense: 30 tablet; Refill: 3  4. Iron deficiency anemia secondary to inadequate dietary iron intake Patient's iron is slightly low, she continues to take iron supplementation daily. Encouraged patient to avoid taking iron with dairy products as she is currently doing, and to assist absorption by taking with vitamin C. She was taking stool softeners for constipation related to the iron, but has been having diarrhea for a long time, so told patient she can stop the stool softener or take it every other day, as needed. FOBT was positive, patient's visual rectal exam was benign. Run series of FOBT to monitor for rectal bleeding. - Fecal Globin By Immunochemistry; Future - Fecal Globin By Immunochemistry; Future - Fecal Globin By Immunochemistry - Fecal Globin By Immunochemistry  5. Chronic obstructive pulmonary disease, unspecified COPD type (HCC) Has been coughing, more so at night. Productive at times, with clear mucus. She has not been using a CPAP and has been fatigued. She already has an appointment scheduled with the pulmonologist on Monday.  6. Diarrhea, unspecified type Stop stool softener and cut back on metformin. Adjust as needed. Patient denies abdominal pain or nausea. Appetite is good.    Return in about 3 months (around 07/11/2022) for routine follow up . Student- Darcel Bayley O'Berry ACPCNP-S  I personally was present during the history, physical exam and medical decision-making activities of this service and have verified that the service and findings are accurately documented in the student's note Gaylin Bulthuis K. Biagio Borg [facility] [phone]

## 2022-04-10 NOTE — Patient Instructions (Signed)
STOP OMEPRAZOLE Start protonix daily for acid reflux- follow will help with cough as well  Discuss fatigue and CPAP with pulmonary   Decrease metfomrin to once daily  Will order 2 ifob to see if there continues to be blood in stool

## 2022-04-13 ENCOUNTER — Ambulatory Visit (HOSPITAL_BASED_OUTPATIENT_CLINIC_OR_DEPARTMENT_OTHER): Payer: Medicare PPO | Admitting: Pulmonary Disease

## 2022-04-13 ENCOUNTER — Encounter (HOSPITAL_BASED_OUTPATIENT_CLINIC_OR_DEPARTMENT_OTHER): Payer: Self-pay | Admitting: Pulmonary Disease

## 2022-04-13 VITALS — BP 122/90 | HR 78 | Ht 66.5 in | Wt 222.8 lb

## 2022-04-13 DIAGNOSIS — J310 Chronic rhinitis: Secondary | ICD-10-CM

## 2022-04-13 DIAGNOSIS — J432 Centrilobular emphysema: Secondary | ICD-10-CM | POA: Diagnosis not present

## 2022-04-13 DIAGNOSIS — J4540 Moderate persistent asthma, uncomplicated: Secondary | ICD-10-CM

## 2022-04-13 MED ORDER — AZELASTINE HCL 0.15 % NA SOLN: 1.0000 | Freq: Every morning | NASAL | 5 refills | Status: DC

## 2022-04-13 NOTE — Progress Notes (Signed)
[facility]  Chief Complaint  Patient presents with   Follow-up    Here for her yearly check up    Past Surgical History:  She  has a past surgical history that includes Rotator cuff repair (Left, 2008); Renal biopsy; Medial partial knee replacement (Bilateral); Abdominal hysterectomy; Bilateral salpingoophorectomy; Mohs surgery; Appendectomy; Conversion to total knee (Right, 07/10/2014); IR Angiogram Selective Each Additional Vessel (02/08/2019); IR INFUSION THROMBOL ARTERIAL INITIAL (MS) (02/08/2019); IR INFUSION THROMBOL ARTERIAL INITIAL (MS) (02/08/2019); IR Angiogram Pulmonary Bilateral Selective (02/08/2019); IR US Guide Vasc Access Right (02/08/2019); IR Angiogram Selective Each Additional Vessel (02/08/2019); IR THROMB F/U EVAL ART/VEN FINAL DAY (MS) (02/09/2019); Replacement total knee (2012); and Colonoscopy (2001).  Past Medical History:  OA, COPD, GERD, HLD, HTN, Hypothyroidism, Nephrotic syndrome, Osteopenia  Constitutional:  BP (!) 122/90 (BP Location: Left Arm, Cuff Size: Large)   Pulse 78   Ht 5' 6.5" (1.689 m)   Wt 222 lb 12.8 oz (101.1 kg)   SpO2 94%   BMI 35.42 kg/m   Brief Summary:  [name] is a 82 y.o. female former smoker with asthma, emphysema, pulmonary embolism, and dyspnea on exertion.  She has a history of obstructive sleep apnea, but wasn't able to tolerate CPAP therapy.      Subjective:   She is here with her daughter.    She didn't realize trelegy is a maintenance inhaler, and didn't understand why it didn't help her breathing immediately.  She does use albuterol intermittently and this helps.  She has sinus congestion and post nasal drip.  She gets this more at night.  She will gag and cough, and this sometimes make her throw up.  Atrovent nasal spray hasn't helped.  She is using flonase at night.  Her daughter is concerned she gets winded with activity easily.  Recovers after resting a few  minutes.  No chest pain.  She is limited in activity due to back and knee pains.  Physical Exam:   Appearance - well kempt   ENMT - no sinus tenderness, no oral exudate, no LAN, Mallampati 3 airway, no stridor  Respiratory - equal breath sounds bilaterally, no wheezing or rales  CV - s1s2 regular rate and rhythm, no murmurs  Ext - no clubbing, no edema  Skin - no rashes  Psych - normal mood and affect      Pulmonary testing:  PFT 06/13/19 >> FEV1 1.45 (66%), FEV1% 81, TLC 3.85 (71%), DLCO 55%  Chest Imaging:  CT chest 07/14/19 >> mild emphysema, small hiatal hernia  Sleep Tests:  PSG 08/05/19 >> AHI 26.3, SpO2 low 85% Auto CPAP 10/11/19 to 11/02/19 >> used on 23 of 23 nights with average 9 hrs 12 min.  Average AHI 3.5 with median CPAP 8 and 95 th percentile CPAP 10 cm H2O  Cardiac Tests:  Echo 04/11/19 >> EF 65 to 70%, mild LVH, grade 1 DD  Social History:  She  reports that she quit smoking about 49 years ago. Her smoking use included cigarettes. She has a 37.50 pack-year smoking history. She has never used smokeless tobacco. She reports current alcohol use of about 0.5 standard drinks of alcohol per week. She reports that she does not use drugs.  Family History:  Her family history includes COPD in her mother; Heart disease in her paternal grandfather; Hypertension in her sister; Lymphoma in her father; Rheumatic fever in her paternal grandmother; Scoliosis in her daughter; Stroke in her maternal grandfather.  Assessment/Plan:   Emphysema, asthma. - discussed different roles for her inhalers - continue trelegy 100 one puff daily and singulair 10 mg nightly - prn albuterol  Chronic rhinitis. - add astepro in the morning and continue flonase at nigth - prn mucinex  Dyspnea on exertion. - explained how deconditioning can contribute to this - encouraged her to try maintaining a regular exercise program as tolerated   Hx of pulmonary embolism. - she will continue  with eliquis indefinitely  CKD 3a, Minimal change disease. - followed by Nephrology at [place] - remains on tacrolimus  Time Spent Involved in Patient Care on Day of Examination:  37 minutes  Follow up:   Patient Instructions  Try using astepro 1 spray in each nostril in the morning.  Continue flonase at night.  Follow up in 6 months.  Medication List:   Allergies as of 04/13/2022   No Known Allergies      Medication List        Accurate as of April 13, 2022  1:44 PM. If you have any questions, ask your nurse or doctor.          STOP taking these medications    ipratropium 0.03 % nasal spray Commonly known as: ATROVENT Stopped by: Chesley Mires, MD       TAKE these medications    albuterol 108 (90 Base) MCG/ACT inhaler Commonly known as: VENTOLIN HFA Inhale 1 puff into the lungs as needed for wheezing or shortness of breath.   Azelastine HCl 0.15 % Soln Commonly known as: Astepro Place 1 spray into the nose every morning. Started by: Chesley Mires, MD   Calcium-Vitamin D 600-400 MG-UNIT Tabs Take 1 tablet by mouth daily.   citalopram 20 MG tablet Commonly known as: CELEXA TAKE 1 TABLET BY MOUTH EVERYDAY AT BEDTIME   donepezil 10 MG tablet Commonly known as: ARICEPT TAKE 1 TABLET BY MOUTH EVERYDAY AT BEDTIME   Eliquis 5 MG Tabs tablet Generic drug: apixaban TAKE 1 TABLET BY MOUTH TWICE A DAY   ferrous sulfate 325 (65 FE) MG EC tablet Take 1 tablet (325 mg total) by mouth daily with breakfast.   fluticasone 50 MCG/ACT nasal spray Commonly known as: FLONASE INSTILL 1 SPRAY INTO BOTH NOSTRILS DAILY   guaiFENesin 600 MG 12 hr tablet Commonly known as: MUCINEX Take 1,200 mg by mouth daily.   levothyroxine 88 MCG tablet Commonly known as: SYNTHROID TAKE 1 TABLET BY MOUTH EVERY DAY IN THE MORNING   losartan 100 MG tablet Commonly known as: COZAAR TAKE 1 TABLET BY MOUTH EVERY DAY   metFORMIN 500 MG tablet Commonly known as: GLUCOPHAGE Take 1  tablet (500 mg total) by mouth daily with supper.   montelukast 10 MG tablet Commonly known as: SINGULAIR TAKE 1 TABLET BY MOUTH EVERYDAY AT BEDTIME   multivitamin with minerals Tabs tablet Take 1 tablet by mouth daily.   pantoprazole 40 MG tablet Commonly known as: PROTONIX Take 1 tablet (40 mg total) by mouth daily.   rOPINIRole 1 MG tablet Commonly known as: REQUIP TAKE 1 TABLET BY MOUTH EVERYDAY AT BEDTIME   rosuvastatin 10 MG tablet Commonly known as: CRESTOR Take 2 tablets (20 mg total) by mouth daily. E78.2   tacrolimus 1 MG capsule Commonly known as: PROGRAF Take 1 mg by mouth 2 (two) times daily.   Trelegy Ellipta 100-62.5-25 MCG/ACT Aepb Generic drug: Fluticasone-Umeclidin-Vilant TAKE 1 PUFF BY MOUTH EVERY DAY        Signature:  Chesley Mires, MD  [facility] Pager - [phone] 04/13/2022, 1:44 PM

## 2022-04-13 NOTE — Patient Instructions (Signed)
Try using astepro 1 spray in each nostril in the morning.  Continue flonase at night.  Follow up in 6 months.

## 2022-04-16 ENCOUNTER — Encounter: Payer: Medicare PPO | Admitting: Nurse Practitioner

## 2022-04-16 ENCOUNTER — Encounter: Payer: Self-pay | Admitting: Nurse Practitioner

## 2022-04-17 DIAGNOSIS — D508 Other iron deficiency anemias: Secondary | ICD-10-CM | POA: Diagnosis not present

## 2022-04-20 NOTE — Progress Notes (Signed)
error 

## 2022-04-21 ENCOUNTER — Other Ambulatory Visit: Payer: Self-pay | Admitting: Nurse Practitioner

## 2022-04-21 DIAGNOSIS — R195 Other fecal abnormalities: Secondary | ICD-10-CM

## 2022-04-21 DIAGNOSIS — D508 Other iron deficiency anemias: Secondary | ICD-10-CM

## 2022-04-21 LAB — FECAL GLOBIN BY IMMUNOCHEMISTRY
FECAL GLOBIN RESULT:: DETECTED — AB
FECAL GLOBIN RESULT:: NOT DETECTED
MICRO NUMBER:: 14675266
MICRO NUMBER:: 14675273
SPECIMEN QUALITY:: ADEQUATE
SPECIMEN QUALITY:: ADEQUATE

## 2022-05-04 DIAGNOSIS — H6121 Impacted cerumen, right ear: Secondary | ICD-10-CM | POA: Diagnosis not present

## 2022-05-04 DIAGNOSIS — H6981 Other specified disorders of Eustachian tube, right ear: Secondary | ICD-10-CM | POA: Diagnosis not present

## 2022-05-04 DIAGNOSIS — H903 Sensorineural hearing loss, bilateral: Secondary | ICD-10-CM | POA: Diagnosis not present

## 2022-05-05 ENCOUNTER — Other Ambulatory Visit: Payer: Self-pay | Admitting: Otolaryngology

## 2022-05-05 DIAGNOSIS — D333 Benign neoplasm of cranial nerves: Secondary | ICD-10-CM

## 2022-05-21 ENCOUNTER — Telehealth: Payer: Self-pay | Admitting: *Deleted

## 2022-05-21 ENCOUNTER — Other Ambulatory Visit: Payer: Self-pay | Admitting: Pulmonary Disease

## 2022-05-21 ENCOUNTER — Encounter: Payer: Medicare PPO | Admitting: Nurse Practitioner

## 2022-05-21 NOTE — Telephone Encounter (Signed)
Patient scheduled for AWV with Shanda Bumps today at 11:00 I have tried calling patient: 10:36- LMOM to return call.  10:46 10:53 11:01-LMOM to call the office to reschedule appointment at her convenience.    Message sent to Admin Staff to unarrive and call patient to reschedule appointment.

## 2022-05-21 NOTE — Progress Notes (Deleted)
   10:36am LMOM to return call for AWV Video visit.

## 2022-05-22 NOTE — Progress Notes (Signed)
Err

## 2022-06-02 ENCOUNTER — Telehealth: Payer: Self-pay | Admitting: *Deleted

## 2022-06-02 ENCOUNTER — Ambulatory Visit
Admission: RE | Admit: 2022-06-02 | Discharge: 2022-06-02 | Disposition: A | Discharge status: Home / Self Care | Payer: Medicare PPO | Source: Ambulatory Visit | Attending: Otolaryngology | Admitting: Otolaryngology

## 2022-06-02 DIAGNOSIS — D333 Benign neoplasm of cranial nerves: Secondary | ICD-10-CM

## 2022-06-02 MED ORDER — GADOPICLENOL 0.5 MMOL/ML IV SOLN
10.0000 mL | Freq: Once | INTRAVENOUS | Status: AC | PRN
  Administered 2022-06-02: 10 mL via INTRAVENOUS

## 2022-06-02 NOTE — Telephone Encounter (Signed)
Prior Authorization Submitted through [place] [id number].   Spoke with Lorin Picket and Medication went into Determination. 48-72 hour turn around.   Case ID#: [id number]   (First called [phone] and spoke with Karena Addison, then transferred to [facility] and then transferred to [facility])  Awaiting PA.

## 2022-06-03 NOTE — Telephone Encounter (Signed)
Received fax from [facility]. #[phone] Prolia is APPROVED 06/02/2022-02/09/2023  Approved EOC #: [id number]

## 2022-06-04 ENCOUNTER — Ambulatory Visit (INDEPENDENT_AMBULATORY_CARE_PROVIDER_SITE_OTHER): Payer: Medicare PPO | Admitting: Nurse Practitioner

## 2022-06-04 ENCOUNTER — Telehealth: Payer: Self-pay | Admitting: *Deleted

## 2022-06-04 DIAGNOSIS — Z0000 Encounter for general adult medical examination without abnormal findings: Secondary | ICD-10-CM

## 2022-06-04 NOTE — Telephone Encounter (Signed)
Patient had a Video Visit today with Shanda Bumps for AWV.  Tried calling patient 3 times with no Success.  LMOM @ 9:41am, 9:49am, 10:00am  LMOM at 10:00am for patient to call the office to reschedule her AWV.   Message sent to [facility] Staff.

## 2022-06-04 NOTE — Progress Notes (Signed)
Subjective:   [name] is a 82 y.o. female who presents for Medicare Annual (Subsequent) preventive examination.  Review of Systems     Cardiac Risk Factors include: advanced age (>15men, >36 women);sedentary lifestyle;hypertension;dyslipidemia;obesity (BMI >30kg/m2)     Objective:    There were no vitals filed for this visit. There is no height or weight on file to calculate BMI.     06/04/2022   11:45 AM 01/26/2022   10:55 AM 10/03/2021   10:26 AM 04/08/2021    4:01 PM 03/10/2021    2:58 PM 08/05/2019    8:54 PM 02/12/2019    3:00 PM  Advanced Directives  Does Patient Have a Medical Advance Directive? Yes Yes Yes  Yes Yes No  Type of Estate agent of [place];Living will Healthcare Power of Freedom;Living will Living will Healthcare Power of Attorney Living will;Healthcare Power of Attorney Living will   Does patient want to make changes to medical advance directive? No - Patient declined No - Patient declined No - Patient declined  No - Patient declined    Copy of Healthcare Power of Attorney in Chart? No - copy requested No - copy requested  No - copy requested No - copy requested      Current Medications (verified) Outpatient Encounter Medications as of 06/04/2022  Medication Sig   albuterol (VENTOLIN HFA) 108 (90 Base) MCG/ACT inhaler Inhale 1 puff into the lungs as needed for wheezing or shortness of breath.   Azelastine HCl (ASTEPRO) 0.15 % SOLN Place 1 spray into the nose every morning.   Calcium Carb-Cholecalciferol (CALCIUM-VITAMIN D) 600-400 MG-UNIT TABS Take 1 tablet by mouth daily.   citalopram (CELEXA) 20 MG tablet TAKE 1 TABLET BY MOUTH EVERYDAY AT BEDTIME   donepezil (ARICEPT) 10 MG tablet TAKE 1 TABLET BY MOUTH EVERYDAY AT BEDTIME   ELIQUIS 5 MG TABS tablet TAKE 1 TABLET BY MOUTH TWICE A DAY   ferrous sulfate 325 (65 FE) MG EC tablet Take 1 tablet (325 mg total) by mouth daily with breakfast.   fluticasone (FLONASE) 50 MCG/ACT  nasal spray INSTILL 1 SPRAY INTO BOTH NOSTRILS DAILY   Fluticasone-Umeclidin-Vilant (TRELEGY ELLIPTA) 100-62.5-25 MCG/ACT AEPB TAKE 1 PUFF BY MOUTH EVERY DAY   guaiFENesin (MUCINEX) 600 MG 12 hr tablet Take 1,200 mg by mouth daily.   levothyroxine (SYNTHROID) 88 MCG tablet TAKE 1 TABLET BY MOUTH EVERY DAY IN THE MORNING   losartan (COZAAR) 100 MG tablet TAKE 1 TABLET BY MOUTH EVERY DAY   metFORMIN (GLUCOPHAGE) 500 MG tablet Take 1 tablet (500 mg total) by mouth daily with supper.   montelukast (SINGULAIR) 10 MG tablet TAKE 1 TABLET BY MOUTH EVERYDAY AT BEDTIME   Multiple Vitamin (MULTIVITAMIN WITH MINERALS) TABS tablet Take 1 tablet by mouth daily.   pantoprazole (PROTONIX) 40 MG tablet Take 1 tablet (40 mg total) by mouth daily.   rOPINIRole (REQUIP) 1 MG tablet TAKE 1 TABLET BY MOUTH EVERYDAY AT BEDTIME   rosuvastatin (CRESTOR) 10 MG tablet Take 2 tablets (20 mg total) by mouth daily. E78.2   tacrolimus (PROGRAF) 1 MG capsule Take 1 mg by mouth 2 (two) times daily.    No facility-administered encounter medications on file as of 06/04/2022.    Allergies (verified) Patient has no known allergies.   History: Past Medical History:  Diagnosis Date   Actinic keratosis    Arthritis    Asthma    Back injury 2019   Fracture   Basal cell carcinoma  COPD (chronic obstructive pulmonary disease) (HCC)    GERD (gastroesophageal reflux disease)    H/O blood clots 2020   Lung, Per [facility] New Patient Packet   H/O mammogram 2022   Per [facility] New Patient Packet   Heart murmur    hx of in childhood    High cholesterol    Hyperlipidemia    Hypertension    Hypothyroidism    Kidney disease    Per [facility] New Patient Packet   MCNS (minimal change nephrotic syndrome)    OSA (obstructive sleep apnea) 08/29/2019   Osteopenia    Shortness of breath dyspnea    on exertion    Squamous cell carcinoma    Thyroid disease    Per [facility] New Patient Packet   Past Surgical History:  Procedure Laterality Date    ABDOMINAL HYSTERECTOMY     APPENDECTOMY     BILATERAL SALPINGOOPHORECTOMY     Ovarian Cysts    COLONOSCOPY  2001   Dr.Mann, Per [facility] New Patient Packet   CONVERSION TO TOTAL KNEE Right 07/10/2014   Procedure: RIGHT CONVERSION OF PARTIAL KNEE TO TOTAL KNEE;  Surgeon: [name], MD;  Location: [facility];  Service: Orthopedics;  Laterality: Right;   IR ANGIOGRAM PULMONARY BILATERAL SELECTIVE  02/08/2019   IR ANGIOGRAM SELECTIVE EACH ADDITIONAL VESSEL  02/08/2019   IR ANGIOGRAM SELECTIVE EACH ADDITIONAL VESSEL  02/08/2019   IR INFUSION THROMBOL ARTERIAL INITIAL (MS)  02/08/2019   IR INFUSION THROMBOL ARTERIAL INITIAL (MS)  02/08/2019   IR THROMB F/U EVAL ART/VEN FINAL DAY (MS)  02/09/2019   IR US GUIDE VASC ACCESS RIGHT  02/08/2019   MEDIAL PARTIAL KNEE REPLACEMENT Bilateral    MOHS SURGERY     x 2   RENAL BIOPSY     x 2   REPLACEMENT TOTAL KNEE  2012   Per [facility] New Patient Packet   ROTATOR CUFF REPAIR Left 2008   Family History  Problem Relation Age of Onset   COPD Mother        Husband Smoked    Lymphoma Father    Hypertension Sister    Scoliosis Daughter    Stroke Maternal Grandfather    Rheumatic fever Paternal Grandmother    Heart disease Paternal Grandfather    Social History   Socioeconomic History   Marital status: Widowed    Spouse name: Not on file   Number of children: 2   Years of education: 16   Highest education level: Not on file  Occupational History   Occupation: TEACHER     Employer: [employer]    Comment: RETIRED   Tobacco Use   Smoking status: Former    Packs/day: 1.50    Years: 25.00    Additional pack years: 0.00    Total pack years: 37.50    Types: Cigarettes    Quit date: 01/12/1973    Years since quitting: 49.4   Smokeless tobacco: Never  Vaping Use   Vaping Use: Never used  Substance and Sexual Activity   Alcohol use: Yes    Alcohol/week: 0.5 standard drinks of alcohol    Types: 1 Standard drinks or equivalent per week     Comment: She occasionally drinks a glass of wine.     Drug use: No   Sexual activity: Not Currently    Partners: Male  Other Topics Concern   Not on file  Social History Narrative   Marital Status:  Widowed   Siblings: Kitty Simmons/ Rachel RuthChildren:  2;  Grandchildren:  5 Pets: NoneLiving Situation: Lives aloneOccupation: Retired Public affairs consultant: Engineer, maintenance (IT) Tobacco Use/Exposure:  She quit smoking > 20 years ago after having smoked 1 1/2 ppd for over 20 years.  Alcohol Use:  Occasional (Wine) Drug Use:  NoneDiet:  RegularExercise:  Water Aerobic Class 3 days a week and WeightsHobbies: Reading/ Crafts/ Biking/ Reading]      Per [facility] New patient packet abstracted on 11/01/2020   Diet: Left blank      Caffeine: Yes      Married, if yes what year: Widow, married in 1961      Do you live in a house, apartment, assisted living, condo, trailer, ect: Patient did not answer correctly (lived alone until 10/1/20222)      Is it one or more stories: 2      How many persons live in your home? 2      Pets: cat      Highest level or education completed: 1 year of college       Current/Past profession: Geologist, engineering       Exercise:      No            Type and how often:          Living Will: Yes   DNR: Yes   POA/HPOA: Yes      Functional Status:   Do you have difficulty bathing or dressing yourself?  Left blank   Do you have difficulty preparing food or eating?Left blank   Do you have difficulty managing your medications?Left blank   Do you have difficulty managing your finances?Left blank   Do you have difficulty affording your medications?Left blank   Social Determinants of Health   Financial Resource Strain: Not on file  Food Insecurity: Not on file  Transportation Needs: Not on file  Physical Activity: Not on file  Stress: Not on file  Social Connections: Not on file    Tobacco Counseling Counseling given: Not Answered   Clinical Intake:  Pre-visit  preparation completed: Yes  Pain : No/denies pain     BMI - recorded: 35  How often do you need to have someone help you when you read instructions, pamphlets, or other written materials from your doctor or pharmacy?: 1 - Never  Diabetic?yes         Activities of Daily Living    06/04/2022   11:54 AM  In your present state of health, do you have any difficulty performing the following activities:  Hearing? 1  Vision? 0  Difficulty concentrating or making decisions? 0  Walking or climbing stairs? 0  Dressing or bathing? 0  Doing errands, shopping? 0  Preparing Food and eating ? N  Using the Toilet? N  In the past six months, have you accidently leaked urine? Y  Do you have problems with loss of bowel control? Y  Managing your Medications? N  Managing your Finances? Y  Housekeeping or managing your Housekeeping? N    Patient Care Team: [name], NP as PCP - General (Geriatric Medicine) [name], MD as Consulting Physician (Orthopedic Surgery) [name], MD as Consulting Physician (Dermatology) [name], MD as Referring Physician (Nephrology) [name], MD as Consulting Physician (Pulmonary Disease) [name] [facility])  Indicate any recent Medical Services you may have received from other than [facility] providers in the past year (date may be approximate).     Assessment:   This is a routine wellness examination for [place].  Hearing/Vision screen Vision Screening - Comments:: Dr. London Sheer. Has appointment in June.   Dietary issues and exercise activities discussed: Current Exercise Habits: The patient does not participate in regular exercise at present   Goals Addressed             This Visit's Progress    Patient Stated       To stay as healthy as she is.        Depression Screen    06/04/2022   11:46 AM 01/26/2022   10:54 AM 04/08/2021    3:59 PM 03/10/2021    2:58 PM 12/12/2012   10:12 AM  PHQ 2/9 Scores  PHQ - 2  Score 0 0 0 0 0    Fall Risk    06/04/2022   11:46 AM 04/10/2022   10:46 AM 01/26/2022   10:54 AM 10/03/2021   10:26 AM 08/04/2021    8:05 AM  Fall Risk   Falls in the past year? 0 0 0 0 0  Number falls in past yr: 0 0 0 0 0  Injury with Fall? 0 0 0 0 0  Risk for fall due to :  No Fall Risks No Fall Risks No Fall Risks No Fall Risks  Follow up  Falls evaluation completed Falls evaluation completed Falls evaluation completed Falls evaluation completed    FALL RISK PREVENTION PERTAINING TO THE HOME:  Any stairs in or around the home? Yes  If so, are there any without handrails? No  Home free of loose throw rugs in walkways, pet beds, electrical cords, etc? Yes  Adequate lighting in your home to reduce risk of falls? Yes   ASSISTIVE DEVICES UTILIZED TO PREVENT FALLS:  Life alert? Yes  Use of a cane, walker or w/c? No  Grab bars in the bathroom? Yes  Shower chair or bench in shower? Yes  Elevated toilet seat or a handicapped toilet? No   TIMED UP AND GO:  Was the test performed? No .   Cognitive Function:    01/13/2021   11:23 AM  MMSE - Mini Mental State Exam  Orientation to time 4  Orientation to time comments said the 4th  Orientation to Place 5  Registration 3  Attention/ Calculation 5  Recall 2  Recall-comments missed penny  Language- name 2 objects 2  Language- repeat 1  Language- follow 3 step command 3  Language- read & follow direction 0  Language-read & follow direction-comments did not read aloud  Write a sentence 1  Copy design 1  Total score 27        06/04/2022   11:46 AM  6CIT Screen  What Year? 0 points  What month? 0 points  What time? 0 points  Count back from 20 0 points  Months in reverse 0 points  Repeat phrase 2 points  Total Score 2 points    Immunizations Immunization History  Administered Date(s) Administered   Fluad Quad(high Dose 65+) 09/13/2018, 11/04/2020, 11/28/2021   Influenza, High Dose Seasonal PF 10/06/2018, 11/07/2019    Influenza,inj,Quad PF,6+ Mos 10/09/2016, 10/27/2017   Influenza-Unspecified 11/09/2013, 10/08/2014, 11/10/2015, 10/09/2016   PFIZER(Purple Top)SARS-COV-2 Vaccination 04/09/2019, 05/09/2019, 12/26/2019   Pneumococcal Conjugate-13 12/26/2014, 04/21/2017   Pneumococcal Polysaccharide-23 02/10/2011   Tdap 12/09/2010   Zoster Recombinat (Shingrix) 04/21/2017, 07/02/2017   Zoster, Live 02/16/2008    TDAP status: Due, Education has been provided regarding the importance of this vaccine. Advised may receive this vaccine at local pharmacy or Health Dept. Aware to  provide a copy of the vaccination record if obtained from local pharmacy or Health Dept. Verbalized acceptance and understanding.  Flu Vaccine status: Up to date  Pneumococcal vaccine status: Up to date  Covid-19 vaccine status: Information provided on how to obtain vaccines.   Qualifies for Shingles Vaccine? Yes   Zostavax completed No   Shingrix Completed?: Yes  Screening Tests Health Maintenance  Topic Date Due   DTaP/Tdap/Td (2 - Td or Tdap) 12/08/2020   OPHTHALMOLOGY EXAM  07/23/2021   FOOT EXAM  03/10/2022   COVID-19 Vaccine (4 - 2023-24 season) 02/10/2023 (Originally 10/10/2021)   Diabetic kidney evaluation - Urine ACR  01/27/2027 (Originally 07/07/2013)   HEMOGLOBIN A1C  07/21/2022   INFLUENZA VACCINE  09/10/2022   Diabetic kidney evaluation - eGFR measurement  01/20/2023   Medicare Annual Wellness (AWV)  06/04/2023   Pneumonia Vaccine 59+ Years old  Completed   DEXA SCAN  Completed   Zoster Vaccines- Shingrix  Completed   HPV VACCINES  Aged Out    Health Maintenance  Health Maintenance Due  Topic Date Due   DTaP/Tdap/Td (2 - Td or Tdap) 12/08/2020   OPHTHALMOLOGY EXAM  07/23/2021   FOOT EXAM  03/10/2022    Colorectal cancer screening: No longer required.   Mammogram status: No longer required due to age.  Bone Density status: Completed 09/23/21. Results reflect: Bone density results: OSTEOPOROSIS. Repeat  every 2 years.  Lung Cancer Screening: (Low Dose CT Chest recommended if Age 58-80 years, 30 pack-year currently smoking OR have quit w/in 15years.) does not qualify.   Lung Cancer Screening Referral: na  Additional Screening:  Hepatitis C Screening: does not qualify  Vision Screening: Recommended annual ophthalmology exams for early detection of glaucoma and other disorders of the eye. Is the patient up to date with their annual eye exam?  Yes  Who is the provider or what is the name of the office in which the patient attends annual eye exams? Dr Shea Evans If pt is not established with a provider, would they like to be referred to a provider to establish care? No .   Dental Screening: Recommended annual dental exams for proper oral hygiene  Community Resource Referral / Chronic Care Management: CRR required this visit?  No   CCM required this visit?  No      Plan:     I have personally reviewed and noted the following in the patient's chart:   Medical and social history Use of alcohol, tobacco or illicit drugs  Current medications and supplements including opioid prescriptions. Patient is not currently taking opioid prescriptions. Functional ability and status Nutritional status Physical activity Advanced directives List of other physicians Hospitalizations, surgeries, and ER visits in previous 12 months Vitals Screenings to include cognitive, depression, and falls Referrals and appointments  In addition, I have reviewed and discussed with patient certain preventive protocols, quality metrics, and best practice recommendations. A written personalized care plan for preventive services as well as general preventive health recommendations were provided to patient.     [name], NP   06/04/2022   Virtual Visit via Video Note  I connected with Arman Filter on 06/04/22 at 10:00 AM EDT by a video enabled telemedicine application and verified that I am speaking  with the correct person using two identifiers.  Location: Patient: home Provider: [place]    I discussed the limitations of evaluation and management by telemedicine and the availability of in person appointments. The patient expressed understanding and  agreed to proceed.    I discussed the assessment and treatment plan with the patient. The patient was provided an opportunity to ask questions and all were answered. The patient agreed with the plan and demonstrated an understanding of the instructions.   The patient was advised to call back or seek an in-person evaluation if the symptoms worsen or if the condition fails to improve as anticipated.  I provided 15 minutes of non-face-to-face time during this encounter.  Janene Harvey. Janyth Contes, AGNP Avs printed and mailed.

## 2022-06-04 NOTE — Patient Instructions (Signed)
  Ms. Kuehnel , Thank you for taking time to come for your Medicare Wellness Visit. I appreciate your ongoing commitment to your health goals. Please review the following plan we discussed and let me know if I can assist you in the future.   These are the goals we discussed:  Goals      Patient Stated     To stay as healthy as she is.         This is a list of the screening recommended for you and due dates:  Health Maintenance  Topic Date Due   DTaP/Tdap/Td vaccine (2 - Td or Tdap) 12/08/2020   Eye exam for diabetics  07/23/2021   Complete foot exam   03/10/2022   COVID-19 Vaccine (4 - 2023-24 season) 02/10/2023*   Yearly kidney health urinalysis for diabetes  01/27/2027*   Hemoglobin A1C  07/21/2022   Flu Shot  09/10/2022   Yearly kidney function blood test for diabetes  01/20/2023   Medicare Annual Wellness Visit  06/04/2023   Pneumonia Vaccine  Completed   DEXA scan (bone density measurement)  Completed   Zoster (Shingles) Vaccine  Completed   HPV Vaccine  Aged Out  *Topic was postponed. The date shown is not the original due date.

## 2022-06-04 NOTE — Progress Notes (Signed)
  This service is provided via telemedicine  No vital signs collected/recorded due to the encounter was a telemedicine visit.   Location of patient (ex: home, work):  Home  Patient consents to a telephone visit:  Yes  Location of the provider (ex: office, home):  Office [place].   Name of any referring provider:  na  Names of all persons participating in the telemedicine service and their role in the encounter:  [name], Patient, [name], CMA, Abbey Chatters, NP  Time spent on call:  7:58

## 2022-06-09 ENCOUNTER — Other Ambulatory Visit: Payer: Self-pay | Admitting: Pulmonary Disease

## 2022-06-13 ENCOUNTER — Other Ambulatory Visit: Payer: Self-pay

## 2022-06-13 ENCOUNTER — Emergency Department (HOSPITAL_BASED_OUTPATIENT_CLINIC_OR_DEPARTMENT_OTHER): Payer: Medicare PPO

## 2022-06-13 ENCOUNTER — Encounter (HOSPITAL_BASED_OUTPATIENT_CLINIC_OR_DEPARTMENT_OTHER): Payer: Self-pay

## 2022-06-13 ENCOUNTER — Inpatient Hospital Stay (HOSPITAL_BASED_OUTPATIENT_CLINIC_OR_DEPARTMENT_OTHER)
Admission: EM | Admit: 2022-06-13 | Discharge: 2022-06-21 | DRG: 417 | Disposition: A | Discharge status: Home / Self Care | Payer: Medicare PPO | Attending: Internal Medicine | Admitting: Internal Medicine

## 2022-06-13 DIAGNOSIS — Z7984 Long term (current) use of oral hypoglycemic drugs: Secondary | ICD-10-CM

## 2022-06-13 DIAGNOSIS — J449 Chronic obstructive pulmonary disease, unspecified: Secondary | ICD-10-CM | POA: Diagnosis present

## 2022-06-13 DIAGNOSIS — Z87891 Personal history of nicotine dependence: Secondary | ICD-10-CM

## 2022-06-13 DIAGNOSIS — E876 Hypokalemia: Secondary | ICD-10-CM | POA: Diagnosis not present

## 2022-06-13 DIAGNOSIS — E782 Mixed hyperlipidemia: Secondary | ICD-10-CM | POA: Diagnosis present

## 2022-06-13 DIAGNOSIS — Z825 Family history of asthma and other chronic lower respiratory diseases: Secondary | ICD-10-CM

## 2022-06-13 DIAGNOSIS — K7689 Other specified diseases of liver: Secondary | ICD-10-CM | POA: Diagnosis not present

## 2022-06-13 DIAGNOSIS — Z96653 Presence of artificial knee joint, bilateral: Secondary | ICD-10-CM | POA: Diagnosis present

## 2022-06-13 DIAGNOSIS — Z8249 Family history of ischemic heart disease and other diseases of the circulatory system: Secondary | ICD-10-CM

## 2022-06-13 DIAGNOSIS — E039 Hypothyroidism, unspecified: Secondary | ICD-10-CM | POA: Diagnosis not present

## 2022-06-13 DIAGNOSIS — R9431 Abnormal electrocardiogram [ECG] [EKG]: Secondary | ICD-10-CM | POA: Diagnosis not present

## 2022-06-13 DIAGNOSIS — K858 Other acute pancreatitis without necrosis or infection: Secondary | ICD-10-CM | POA: Diagnosis not present

## 2022-06-13 DIAGNOSIS — I5031 Acute diastolic (congestive) heart failure: Secondary | ICD-10-CM | POA: Diagnosis present

## 2022-06-13 DIAGNOSIS — R0902 Hypoxemia: Secondary | ICD-10-CM | POA: Diagnosis present

## 2022-06-13 DIAGNOSIS — K859 Acute pancreatitis without necrosis or infection, unspecified: Secondary | ICD-10-CM | POA: Diagnosis not present

## 2022-06-13 DIAGNOSIS — Z79899 Other long term (current) drug therapy: Secondary | ICD-10-CM

## 2022-06-13 DIAGNOSIS — Z66 Do not resuscitate: Secondary | ICD-10-CM | POA: Diagnosis present

## 2022-06-13 DIAGNOSIS — R079 Chest pain, unspecified: Secondary | ICD-10-CM | POA: Diagnosis not present

## 2022-06-13 DIAGNOSIS — J4489 Other specified chronic obstructive pulmonary disease: Secondary | ICD-10-CM | POA: Diagnosis not present

## 2022-06-13 DIAGNOSIS — K219 Gastro-esophageal reflux disease without esophagitis: Secondary | ICD-10-CM | POA: Diagnosis not present

## 2022-06-13 DIAGNOSIS — J9811 Atelectasis: Secondary | ICD-10-CM | POA: Diagnosis not present

## 2022-06-13 DIAGNOSIS — Z79621 Long term (current) use of calcineurin inhibitor: Secondary | ICD-10-CM

## 2022-06-13 DIAGNOSIS — I130 Hypertensive heart and chronic kidney disease with heart failure and stage 1 through stage 4 chronic kidney disease, or unspecified chronic kidney disease: Secondary | ICD-10-CM | POA: Diagnosis present

## 2022-06-13 DIAGNOSIS — J45909 Unspecified asthma, uncomplicated: Secondary | ICD-10-CM | POA: Diagnosis not present

## 2022-06-13 DIAGNOSIS — E119 Type 2 diabetes mellitus without complications: Secondary | ICD-10-CM | POA: Diagnosis not present

## 2022-06-13 DIAGNOSIS — Z86711 Personal history of pulmonary embolism: Secondary | ICD-10-CM

## 2022-06-13 DIAGNOSIS — K8500 Idiopathic acute pancreatitis without necrosis or infection: Secondary | ICD-10-CM | POA: Diagnosis not present

## 2022-06-13 DIAGNOSIS — Z85828 Personal history of other malignant neoplasm of skin: Secondary | ICD-10-CM | POA: Diagnosis not present

## 2022-06-13 DIAGNOSIS — K8010 Calculus of gallbladder with chronic cholecystitis without obstruction: Secondary | ICD-10-CM | POA: Diagnosis not present

## 2022-06-13 DIAGNOSIS — I2699 Other pulmonary embolism without acute cor pulmonale: Secondary | ICD-10-CM | POA: Diagnosis present

## 2022-06-13 DIAGNOSIS — G4733 Obstructive sleep apnea (adult) (pediatric): Secondary | ICD-10-CM | POA: Diagnosis present

## 2022-06-13 DIAGNOSIS — K851 Biliary acute pancreatitis without necrosis or infection: Principal | ICD-10-CM | POA: Diagnosis present

## 2022-06-13 DIAGNOSIS — Z79890 Hormone replacement therapy (postmenopausal): Secondary | ICD-10-CM

## 2022-06-13 DIAGNOSIS — N189 Chronic kidney disease, unspecified: Secondary | ICD-10-CM | POA: Diagnosis not present

## 2022-06-13 DIAGNOSIS — E034 Atrophy of thyroid (acquired): Secondary | ICD-10-CM

## 2022-06-13 DIAGNOSIS — R0600 Dyspnea, unspecified: Secondary | ICD-10-CM | POA: Diagnosis not present

## 2022-06-13 DIAGNOSIS — Z823 Family history of stroke: Secondary | ICD-10-CM

## 2022-06-13 DIAGNOSIS — J811 Chronic pulmonary edema: Secondary | ICD-10-CM | POA: Diagnosis not present

## 2022-06-13 DIAGNOSIS — Z7901 Long term (current) use of anticoagulants: Secondary | ICD-10-CM

## 2022-06-13 DIAGNOSIS — K8050 Calculus of bile duct without cholangitis or cholecystitis without obstruction: Secondary | ICD-10-CM | POA: Diagnosis not present

## 2022-06-13 DIAGNOSIS — K838 Other specified diseases of biliary tract: Secondary | ICD-10-CM | POA: Diagnosis not present

## 2022-06-13 DIAGNOSIS — I10 Essential (primary) hypertension: Secondary | ICD-10-CM | POA: Diagnosis not present

## 2022-06-13 DIAGNOSIS — I4719 Other supraventricular tachycardia: Secondary | ICD-10-CM | POA: Diagnosis not present

## 2022-06-13 DIAGNOSIS — R0789 Other chest pain: Secondary | ICD-10-CM | POA: Diagnosis not present

## 2022-06-13 DIAGNOSIS — Z807 Family history of other malignant neoplasms of lymphoid, hematopoietic and related tissues: Secondary | ICD-10-CM

## 2022-06-13 DIAGNOSIS — K8064 Calculus of gallbladder and bile duct with chronic cholecystitis without obstruction: Secondary | ICD-10-CM | POA: Diagnosis not present

## 2022-06-13 DIAGNOSIS — K8581 Other acute pancreatitis with uninfected necrosis: Secondary | ICD-10-CM | POA: Diagnosis not present

## 2022-06-13 LAB — URINALYSIS, ROUTINE W REFLEX MICROSCOPIC
Bilirubin Urine: NEGATIVE
Glucose, UA: NEGATIVE mg/dL
Hgb urine dipstick: NEGATIVE
Ketones, ur: NEGATIVE mg/dL
Leukocytes,Ua: NEGATIVE
Nitrite: NEGATIVE
Specific Gravity, Urine: >1.046 — ABNORMAL HIGH (ref 1.005–1.030)
pH: 5.5 (ref 5.0–8.0)

## 2022-06-13 LAB — CBC WITH DIFFERENTIAL/PLATELET
Abs Immature Granulocytes: 0.1 10*3/uL — ABNORMAL HIGH (ref 0.00–0.07)
Basophils Absolute: 0.1 10*3/uL (ref 0.0–0.1)
Basophils Relative: 1 %
Eosinophils Absolute: 0.2 10*3/uL (ref 0.0–0.5)
Eosinophils Relative: 1 %
HCT: 40.7 % (ref 36.0–46.0)
Hemoglobin: 13 g/dL (ref 12.0–15.0)
Immature Granulocytes: 1 %
Lymphocytes Relative: 10 %
Lymphs Abs: 1.5 10*3/uL (ref 0.7–4.0)
MCH: 27.6 pg (ref 26.0–34.0)
MCHC: 31.9 g/dL (ref 30.0–36.0)
MCV: 86.4 fL (ref 80.0–100.0)
Monocytes Absolute: 1.4 10*3/uL — ABNORMAL HIGH (ref 0.1–1.0)
Monocytes Relative: 9 %
Neutro Abs: 12.1 10*3/uL — ABNORMAL HIGH (ref 1.7–7.7)
Neutrophils Relative %: 78 %
Platelets: 296 10*3/uL (ref 150–400)
RBC: 4.71 MIL/uL (ref 3.87–5.11)
RDW: 15.5 % (ref 11.5–15.5)
WBC: 15.3 10*3/uL — ABNORMAL HIGH (ref 4.0–10.5)
nRBC: 0 % (ref 0.0–0.2)

## 2022-06-13 LAB — TROPONIN I (HIGH SENSITIVITY)
Troponin I (High Sensitivity): 11 ng/L (ref <18)
Troponin I (High Sensitivity): 9 ng/L (ref <18)

## 2022-06-13 LAB — HEPATIC FUNCTION PANEL
ALT: 23 U/L (ref 0–44)
AST: 42 U/L — ABNORMAL HIGH (ref 15–41)
Albumin: 3.4 g/dL — ABNORMAL LOW (ref 3.5–5.0)
Alkaline Phosphatase: 38 U/L (ref 38–126)
Bilirubin, Direct: 0.2 mg/dL (ref 0.0–0.2)
Indirect Bilirubin: 0.3 mg/dL (ref 0.3–0.9)
Total Bilirubin: 0.5 mg/dL (ref 0.3–1.2)
Total Protein: 6.5 g/dL (ref 6.5–8.1)

## 2022-06-13 LAB — CBC
HCT: 40.2 % (ref 36.0–46.0)
Hemoglobin: 12.2 g/dL (ref 12.0–15.0)
MCH: 27.4 pg (ref 26.0–34.0)
MCHC: 30.3 g/dL (ref 30.0–36.0)
MCV: 90.3 fL (ref 80.0–100.0)
Platelets: 262 10*3/uL (ref 150–400)
RBC: 4.45 MIL/uL (ref 3.87–5.11)
RDW: 15.9 % — ABNORMAL HIGH (ref 11.5–15.5)
WBC: 11.6 10*3/uL — ABNORMAL HIGH (ref 4.0–10.5)
nRBC: 0 % (ref 0.0–0.2)

## 2022-06-13 LAB — BASIC METABOLIC PANEL
Anion gap: 10 (ref 5–15)
BUN: 20 mg/dL (ref 8–23)
CO2: 28 mmol/L (ref 22–32)
Calcium: 8.8 mg/dL — ABNORMAL LOW (ref 8.9–10.3)
Chloride: 103 mmol/L (ref 98–111)
Creatinine, Ser: 1.08 mg/dL — ABNORMAL HIGH (ref 0.44–1.00)
GFR, Estimated: 51 mL/min — ABNORMAL LOW (ref >60)
Glucose, Bld: 151 mg/dL — ABNORMAL HIGH (ref 70–99)
Potassium: 3.6 mmol/L (ref 3.5–5.1)
Sodium: 141 mmol/L (ref 135–145)

## 2022-06-13 LAB — PHOSPHORUS: Phosphorus: 3.6 mg/dL (ref 2.5–4.6)

## 2022-06-13 LAB — CREATININE, SERUM
Creatinine, Ser: 0.9 mg/dL (ref 0.44–1.00)
GFR, Estimated: >60 mL/min (ref >60)

## 2022-06-13 LAB — LIPASE, BLOOD: Lipase: 3094 U/L — ABNORMAL HIGH (ref 11–51)

## 2022-06-13 LAB — MAGNESIUM: Magnesium: 1.6 mg/dL — ABNORMAL LOW (ref 1.7–2.4)

## 2022-06-13 MED ORDER — LACTATED RINGERS IV SOLN
INTRAVENOUS | Status: DC
  Administered 2022-06-13 – 2022-06-14 (×2): 125 mL/h via INTRAVENOUS

## 2022-06-13 MED ORDER — ONDANSETRON HCL 4 MG/2ML IJ SOLN
4.0000 mg | Freq: Four times a day (QID) | INTRAMUSCULAR | Status: DC | PRN
  Administered 2022-06-15: 4 mg via INTRAVENOUS
  Filled 2022-06-13: qty 2

## 2022-06-13 MED ORDER — ONDANSETRON HCL 4 MG/2ML IJ SOLN
4.0000 mg | Freq: Once | INTRAMUSCULAR | Status: AC
  Administered 2022-06-13: 4 mg via INTRAVENOUS
  Filled 2022-06-13 (×2): qty 2

## 2022-06-13 MED ORDER — IOHEXOL 350 MG/ML SOLN
100.0000 mL | Freq: Once | INTRAVENOUS | Status: AC | PRN
  Administered 2022-06-13: 75 mL via INTRAVENOUS

## 2022-06-13 MED ORDER — SODIUM CHLORIDE 0.9 % IV BOLUS
1000.0000 mL | Freq: Once | INTRAVENOUS | Status: AC
  Administered 2022-06-13: 1000 mL via INTRAVENOUS

## 2022-06-13 MED ORDER — HYDROMORPHONE HCL 1 MG/ML IJ SOLN
0.5000 mg | INTRAMUSCULAR | Status: DC | PRN
  Administered 2022-06-13 – 2022-06-18 (×15): 0.5 mg via INTRAVENOUS
  Filled 2022-06-13 (×15): qty 0.5

## 2022-06-13 MED ORDER — ONDANSETRON HCL 4 MG PO TABS: 4.0000 mg | ORAL_TABLET | Freq: Four times a day (QID) | ORAL | Status: DC | PRN

## 2022-06-13 MED ORDER — LABETALOL HCL 5 MG/ML IV SOLN
5.0000 mg | INTRAVENOUS | Status: DC | PRN
  Administered 2022-06-14: 5 mg via INTRAVENOUS
  Filled 2022-06-13: qty 4

## 2022-06-13 MED ORDER — ENOXAPARIN SODIUM 40 MG/0.4ML IJ SOSY
40.0000 mg | PREFILLED_SYRINGE | INTRAMUSCULAR | Status: DC
  Administered 2022-06-13 – 2022-06-16 (×4): 40 mg via SUBCUTANEOUS
  Filled 2022-06-13 (×4): qty 0.4

## 2022-06-13 MED ORDER — FENTANYL CITRATE PF 50 MCG/ML IJ SOSY
50.0000 ug | PREFILLED_SYRINGE | Freq: Once | INTRAMUSCULAR | Status: AC
  Administered 2022-06-13: 50 ug via INTRAVENOUS
  Filled 2022-06-13 (×2): qty 1

## 2022-06-13 MED ORDER — SODIUM CHLORIDE 0.9 % IV SOLN: INTRAVENOUS | Status: DC

## 2022-06-13 MED ORDER — MAGNESIUM SULFATE 2 GM/50ML IV SOLN
2.0000 g | Freq: Once | INTRAVENOUS | Status: AC
  Administered 2022-06-13: 2 g via INTRAVENOUS
  Filled 2022-06-13: qty 50

## 2022-06-13 NOTE — ED Provider Notes (Signed)
Care assumed from Dr. Wilkie Aye.  Patient with history of pulmonary embolism on Eliquis awoke with chest pain and pressure about 2 AM.  Pain was fairly constant.  No radiation.  Some shortness of breath.  No cough or fever.  EKG shows no acute ischemia.  Troponin negative x 2.  CT scan is pending for evaluation for new PE or occult pneumonia.  Does have leukocytosis.  CT negative for pulmonary embolism.  There is atelectasis at the lung bases.  Enlarged left atrium.  Small hiatal hernia.  Also some stranding of pancreatic tail and splenic hilum.  On reassessment, patient now complaining of some epigastric pain that radiates up into her chest.  Query whether she could have pancreatitis causing her symptoms.  Will add LFTs and lipase.  She became somewhat short of breath with exertion but then desaturated to 90%.  She feels like her breathing is at baseline.  Lungs are clear without wheezing.  Lipase elevated over 3000.  LFTs are normal.  Pancreatitis likely explains for chest pain and epigastric pain.  CT scan is obtained of the abdomen and pelvis which reveals suspicion for gallstone pancreatitis and choledocholithiasis.  Will hydrate patient.  She declines pain and nausea medications. Plan admission to the hospital.  Discussed with Dr. Stevphen Rochester, MD 06/13/22 1002

## 2022-06-13 NOTE — Plan of Care (Signed)
  Problem: Education: Goal: Knowledge of General Education information will improve Description Including pain rating scale, medication(s)/side effects and non-pharmacologic comfort measures Outcome: Progressing   

## 2022-06-13 NOTE — ED Notes (Signed)
Patient to CT.

## 2022-06-13 NOTE — ED Notes (Signed)
Pt report called to Mick[facility]

## 2022-06-13 NOTE — ED Notes (Signed)
Ambulated patient in hallway. Oxygen saturation remained between 91-93% throughout ambulation. Pt tolerated well.

## 2022-06-13 NOTE — Progress Notes (Signed)
Plan of Care Note for accepted transfer   Patient: [name] MRN: [id number]   DOA: 06/13/2022  [facility]: [facility]  Requesting Provider: Glynn Octave, MD. Reason for transfer: Acute pancreatitis. Facility course:   Per Dr. Wilkie Aye: "About 2 Chief Complaint  Patient presents with   Chest Pain    [name] is a 82 y.o. female.   HPI   This is an 82 year old female who presents with chest discomfort.  Patient reports that she woke up this morning from sleep with pressure-like pain on her chest.  She has never had pain like this before.  She does have a history of PE but reports compliance with her Eliquis.  She denies any fevers or shortness of breath.  She states pain is somewhat improved.  It was nonexertional in nature.  No recent cough or fevers."  Per Dr. Manus Gunning: "Care assumed from Dr. Wilkie Aye.  Patient with history of pulmonary embolism on Eliquis awoke with chest pain and pressure about 2 AM.  Pain was fairly constant.  No radiation.  Some shortness of breath.  No cough or fever.   EKG shows no acute ischemia.  Troponin negative x 2.  CT scan is pending for evaluation for new PE or occult pneumonia.  Does have leukocytosis.   CT negative for pulmonary embolism.  There is atelectasis at the lung bases.  Enlarged left atrium.  Small hiatal hernia.  Also some stranding of pancreatic tail and splenic hilum.   On reassessment, patient now complaining of some epigastric pain that radiates up into her chest.  Query whether she could have pancreatitis causing her symptoms.  Will add LFTs and lipase.   She became somewhat short of breath with exertion but then desaturated to 90%.  She feels like her breathing is at baseline.  Lungs are clear without wheezing.   Lipase elevated over 3000.  LFTs are normal.  Pancreatitis likely explains for chest pain and epigastric pain.   CT scan is obtained of the abdomen and pelvis which reveals suspicion for  gallstone pancreatitis and choledocholithiasis.   Will hydrate patient.  She declines pain and nausea medications. Plan admission to the hospital."   Hepatic function panel [[id number]] (Abnormal)   Collected: 06/13/22 0800   Updated: 06/13/22 0851   Specimen Type: Blood   Specimen Source: Vein    Total Protein 6.5 g/dL   Albumin 3.4 Low  g/dL   AST 42 High  U/L   ALT 23 U/L   Alkaline Phosphatase 38 U/L   Total Bilirubin 0.5 mg/dL   Bilirubin, Direct 0.2 mg/dL   Indirect Bilirubin 0.3 mg/dL  Lipase, blood [[id number]] (Abnormal)   Collected: 06/13/22 0800   Updated: 06/13/22 0851   Specimen Type: Blood   Specimen Source: Vein    Lipase 3,094 High  U/L  Urinalysis, Routine w reflex microscopic -Urine, Clean Catch [[id number]] (Abnormal)   Collected: 06/13/22 0750   Updated: 06/13/22 0806   Specimen Source: Urine, Clean Catch    Color, Urine YELLOW   APPearance CLEAR   Specific Gravity, Urine >1.046 High    pH 5.5   Glucose, UA NEGATIVE mg/dL   Hgb urine dipstick NEGATIVE   Bilirubin Urine NEGATIVE   Ketones, ur NEGATIVE mg/dL   Protein, ur TRACE Abnormal  mg/dL   Nitrite NEGATIVE   Leukocytes,Ua NEGATIVE   RBC / HPF 11-20 RBC/hpf   WBC, UA 11-20 WBC/hpf   Bacteria, UA FEW Abnormal    Squamous Epithelial / HPF  0-5 /HPF   Mucus PRESENT  Troponin I (High Sensitivity) [[id number]]   Collected: 06/13/22 0635   Updated: 06/13/22 0727    Troponin I (High Sensitivity) 9 ng/L  CBC with Differential/Platelet [[id number]] (Abnormal)   Collected: 06/13/22 0504   Updated: 06/13/22 0510    WBC 15.3 High  K/uL   RBC 4.71 MIL/uL   Hemoglobin 13.0 g/dL   HCT 78.2 %   MCV 95.6 fL   MCH 27.6 pg   MCHC 31.9 g/dL   RDW 21.3 %   Platelets 296 K/uL   nRBC 0.0 %   Neutrophils Relative % 78 %   Neutro Abs 12.1 High  K/uL   Lymphocytes Relative 10 %   Lymphs Abs 1.5 K/uL   Monocytes Relative 9 %   Monocytes Absolute 1.4 High  K/uL   Eosinophils Relative 1 %   Eosinophils  Absolute 0.2 K/uL   Basophils Relative 1 %   Basophils Absolute 0.1 K/uL   Immature Granulocytes 1 %   Abs Immature Granulocytes 0.10 High  K/uL  Troponin I (High Sensitivity) [[id number]]   Collected: 06/13/22 0409   Updated: 06/13/22 0459    Troponin I (High Sensitivity) 11 ng/L  Basic metabolic panel [[id number]] (Abnormal)   Collected: 06/13/22 0409   Updated: 06/13/22 0455   Specimen Type: Blood   Specimen Source: Vein    Sodium 141 mmol/L   Potassium 3.6 mmol/L   Chloride 103 mmol/L   CO2 28 mmol/L   Glucose, Bld 151 High  mg/dL   BUN 20 mg/dL   Creatinine, Ser 2.44 High  mg/dL   Calcium 8.8 Low  mg/dL   GFR, Estimated 51 Low  mL/min   CT abdomen/pelvis with contrast: IMPRESSION: 1. Acute pancreatitis with inflammatory changes centered around the pancreas and porta hepatis. Small calculi in the region of the distal common bile duct and findings could represent gallstone pancreatitis. 2. Cholelithiasis and probable choledocholithiasis. 3. Hepatic cysts. 4. Small hiatal hernia. 5. Aortic Atherosclerosis (ICD10-I70.0).   Electronically Signed   By: Richarda Overlie M.D.   On: 06/13/2022 09:04  CTA chest: IMPRESSION: 1. Negative for pulmonary embolism. 2. Question stranding near the pancreatic tail and splenic hilum. This area is incompletely imaged. Recommend clinical correlation and consider further characterization with a dedicated CT of the abdomen and pelvis. 3. Patchy dependent densities in both lungs are most compatible with atelectasis. 4. Enlarged left atrium. Consider further characterization with echocardiogram. 5. Small hiatal hernia. 6. Emphysema (ICD10-J43.9).   Electronically Signed   By: Richarda Overlie M.D.   On: 06/13/2022 07:31   Plan of care: The patient has been treated with IV fluids, analgesics and antiemetics.  She is accepted for admission to Telemetry unit, at [facility].  Author: Bobette Mo, MD 06/13/2022  Check  [email or web address] for on-call coverage.  Nursing staff, Please call [facility] Admits & Consults System-Wide number on Amion as soon as patient's arrival, so appropriate admitting provider can evaluate the pt.

## 2022-06-13 NOTE — H&P (Signed)
History and Physical    Patient: [name] ZOX:[id number] DOB: [birth year] DOA: 06/13/2022 DOS: the patient was seen and examined on 06/13/2022 PCP: [name], NP  Patient coming from: Home  Chief Complaint:  Chief Complaint  Patient presents with   Chest Pain   HPI: [name] is a 82 y.o. female with medical history significant of COPD, GERD, hypothyroidism, essential hypertension, hyperlipidemia, hyperlipidemia, asthma, history of pulmonary embolism on Eliquis who presented to med [facility] with chest and epigastric pain.  Patient was initially evaluated for cardiac cause of her chest pain.  She is compliant with her Eliquis for the PE.  Initial EKG showed normal sinus rhythm with no evidence of ischemia.  Troponin was also negative x 2.  CT scan angiogram of the chest showed no PE. Who CT abdomen and pelvis showed acute pancreatitis with inflammatory changes around the pancreas 4 to hepatitis.  There is a small calculi in the region of the distal common bile duct with cholelithiasis.  Suspected gallstone pancreatitis.  Lipase is also more than 3000.  The LFTs appear relatively stable.  Patient therefore admitted to medical service with diagnosis of gallstone pancreatitis.  GI consulted by the ER.  Review of Systems: As mentioned in the history of present illness. All other systems reviewed and are negative. Past Medical History:  Diagnosis Date   Actinic keratosis    Arthritis    Asthma    Back injury 2019   Fracture   Basal cell carcinoma    COPD (chronic obstructive pulmonary disease) (HCC)    GERD (gastroesophageal reflux disease)    H/O blood clots 2020   Lung, Per [facility] New Patient Packet   H/O mammogram 2022   Per [facility] New Patient Packet   Heart murmur    hx of in childhood    High cholesterol    Hyperlipidemia    Hypertension    Hypothyroidism    Kidney disease    Per [facility] New Patient Packet   MCNS (minimal change nephrotic syndrome)     OSA (obstructive sleep apnea) 08/29/2019   Osteopenia    Shortness of breath dyspnea    on exertion    Squamous cell carcinoma    Thyroid disease    Per [facility] New Patient Packet   Past Surgical History:  Procedure Laterality Date   ABDOMINAL HYSTERECTOMY     APPENDECTOMY     BILATERAL SALPINGOOPHORECTOMY     Ovarian Cysts    COLONOSCOPY  2001   Dr.Mann, Per [facility] New Patient Packet   CONVERSION TO TOTAL KNEE Right 07/10/2014   Procedure: RIGHT CONVERSION OF PARTIAL KNEE TO TOTAL KNEE;  Surgeon: [name], MD;  Location: [facility]S;  Service: Orthopedics;  Laterality: Right;   IR ANGIOGRAM PULMONARY BILATERAL SELECTIVE  02/08/2019   IR ANGIOGRAM SELECTIVE EACH ADDITIONAL VESSEL  02/08/2019   IR ANGIOGRAM SELECTIVE EACH ADDITIONAL VESSEL  02/08/2019   IR INFUSION THROMBOL ARTERIAL INITIAL (MS)  02/08/2019   IR INFUSION THROMBOL ARTERIAL INITIAL (MS)  02/08/2019   IR THROMB F/U EVAL ART/VEN FINAL DAY (MS)  02/09/2019   IR US GUIDE VASC ACCESS RIGHT  02/08/2019   MEDIAL PARTIAL KNEE REPLACEMENT Bilateral    MOHS SURGERY     x 2   RENAL BIOPSY     x 2   REPLACEMENT TOTAL KNEE  2012   Per [facility] New Patient Packet   ROTATOR CUFF REPAIR Left 2008   Social History:  reports that  she quit smoking about 49 years ago. Her smoking use included cigarettes. She has a 37.50 pack-year smoking history. She has never used smokeless tobacco. She reports current alcohol use of about 0.5 standard drinks of alcohol per week. She reports that she does not use drugs.  No Known Allergies  Family History  Problem Relation Age of Onset   COPD Mother        Husband Smoked    Lymphoma Father    Hypertension Sister    Scoliosis Daughter    Stroke Maternal Grandfather    Rheumatic fever Paternal Grandmother    Heart disease Paternal Grandfather     Prior to Admission medications   Medication Sig Start Date End Date Taking? Authorizing Provider  albuterol (VENTOLIN HFA) 108 (90 Base) MCG/ACT inhaler  Inhale 1 puff into the lungs as needed for wheezing or shortness of breath.    [provider]  Azelastine HCl (ASTEPRO) 0.15 % SOLN Place 1 spray into the nose every morning. 04/13/22   [name], MD  Calcium Carb-Cholecalciferol (CALCIUM-VITAMIN D) 600-400 MG-UNIT TABS Take 1 tablet by mouth daily.    [provider]  citalopram (CELEXA) 20 MG tablet TAKE 1 TABLET BY MOUTH EVERYDAY AT BEDTIME 02/12/22   [name], NP  donepezil (ARICEPT) 10 MG tablet TAKE 1 TABLET BY MOUTH EVERYDAY AT BEDTIME 02/12/22   Eubanks, Janene Harvey, NP  ELIQUIS 5 MG TABS tablet TAKE 1 TABLET BY MOUTH TWICE A DAY 05/22/22   [name], MD  ferrous sulfate 325 (65 FE) MG EC tablet Take 1 tablet (325 mg total) by mouth daily with breakfast. 01/26/22   [name], NP  fluticasone (FLONASE) 50 MCG/ACT nasal spray INSTILL 1 SPRAY INTO BOTH NOSTRILS DAILY 03/12/22   [name], MD  Fluticasone-Umeclidin-Vilant (TRELEGY ELLIPTA) 100-62.5-25 MCG/ACT AEPB TAKE 1 PUFF BY MOUTH EVERY DAY 09/02/21   [name], MD  guaiFENesin (MUCINEX) 600 MG 12 hr tablet Take 1,200 mg by mouth daily.    [provider]  levothyroxine (SYNTHROID) 88 MCG tablet TAKE 1 TABLET BY MOUTH EVERY DAY IN THE MORNING 11/26/21   [name], NP  losartan (COZAAR) 100 MG tablet TAKE 1 TABLET BY MOUTH EVERY DAY 03/12/22   [name], NP  metFORMIN (GLUCOPHAGE) 500 MG tablet Take 1 tablet (500 mg total) by mouth daily with supper. 04/10/22   [name], NP  montelukast (SINGULAIR) 10 MG tablet TAKE 1 TABLET BY MOUTH EVERYDAY AT BEDTIME 03/12/22   [name], MD  Multiple Vitamin (MULTIVITAMIN WITH MINERALS) TABS tablet Take 1 tablet by mouth daily.    [provider]  pantoprazole (PROTONIX) 40 MG tablet Take 1 tablet (40 mg total) by mouth daily. 04/10/22   [name], NP  rOPINIRole (REQUIP) 1 MG tablet TAKE 1 TABLET BY MOUTH EVERYDAY AT BEDTIME 12/18/21   [name], NP   rosuvastatin (CRESTOR) 10 MG tablet Take 2 tablets (20 mg total) by mouth daily. E78.2 02/19/22   [name], NP  tacrolimus (PROGRAF) 1 MG capsule Take 1 mg by mouth 2 (two) times daily.  01/03/19   [provider]    Physical Exam: Vitals:   06/13/22 1300 06/13/22 1545 06/13/22 1700 06/13/22 1814  BP: (!) 175/107  (!) 174/104 (!) 161/96  Pulse: 71  69   Resp: 17  (!) 25 16  Temp:  98.2 F (36.8 C)  98 F (36.7 C)  TempSrc:  Oral  SpO2: 97%  94% 98%   Constitutional: Acutely ill looking, no distress NAD, calm, comfortable Eyes: PERRL, lids and conjunctivae normal ENMT: Mucous membranes are moist. Posterior pharynx clear of any exudate or lesions.Normal dentition.  Neck: normal, supple, no masses, no thyromegaly Respiratory: clear to auscultation bilaterally, no wheezing, no crackles. Normal respiratory effort. No accessory muscle use.  Cardiovascular: Regular rate and rhythm, no murmurs / rubs / gallops. No extremity edema. 2+ pedal pulses. No carotid bruits.  Abdomen: Mild epigastric no masses palpated. No hepatosplenomegaly. Bowel sounds positive.  Musculoskeletal: Good range of motion, no joint swelling or tenderness, Skin: no rashes, lesions, ulcers. No induration Neurologic: CN 2-12 grossly intact. Sensation intact, DTR normal. Strength 5/5 in all 4.  Psychiatric: Normal judgment and insight. Alert and oriented x 3. Normal mood  Data Reviewed:  Blood pressure 175/107,Urinalysis negative.  Magnesium 1.6, lipase 3094, AST 42.Creatinine 1.08 and glucose 151.  White count is 15.3 the rest of the CBC within normal.  CT angiogram of the chest shows no PE.  Stranding near pancreatic tail and splenic hilum.  Subsequent CT abdomen and pelvis showed acute pancreatitis with inflammatory changes centered around the pancreas and porta hepatis also small clinically in the region of the distal common bile duct.  Assessment and Plan:  #1 acute gallstone pancreatitis:  Patient will be admitted.  Pain management.  Complete NPO.  Get right upper quadrant abdominal ultrasound.  GI consulted for ERCP.  Will keep patient NPO.  Aggressively hydrate.  #2 cholelithiasis: Possible cholecystitis.  I will empirically start patient on IV Zosyn.  #3 hypomagnesemia: Replete magnesium.  #4 uncontrolled hypertension: Patient unable to take Oral blood pressure medications which may be responsible for these findings.  I will initiate IV labetalol for blood pressure control.  #5 COPD: No acute exacerbation.  #6 GERD: Continue PPIs.  #7 history of pulmonary embolism: We will hold Eliquis as patient may likely get ERCP and possible cholecystectomy.  #8 hypothyroidism: Will resume home treatment when oral intake returns.  #9 hyperlipidemia: Resume home regimen also when oral intake returns.    Advance Care Planning:   Code Status: Full Code   Consults: GI consult, will likely need surgical consult as well  Family Communication: No family at bedside  Severity of Illness: The appropriate patient status for this patient is INPATIENT. Inpatient status is judged to be reasonable and necessary in order to provide the required intensity of service to ensure the patient's safety. The patient's presenting symptoms, physical exam findings, and initial radiographic and laboratory data in the context of their chronic comorbidities is felt to place them at high risk for further clinical deterioration. Furthermore, it is not anticipated that the patient will be medically stable for discharge from the hospital within 2 midnights of admission.   * I certify that at the point of admission it is my clinical judgment that the patient will require inpatient hospital care spanning beyond 2 midnights from the point of admission due to high intensity of service, high risk for further deterioration and high frequency of surveillance required.*  AuthorLonia Blood, MD 06/13/2022 6:44 PM  For on  call review [email or web address].

## 2022-06-13 NOTE — ED Triage Notes (Signed)
Pt states, "I think I'm having a heart attack, my chest is hurting so bad." Pt c/o centralized CP, "tightness, sort of hurt." Associated nausea, "threw up some phlegm." Hx COPD, clots "but I'm on eliquis now." Pt denies radiation into arm, jaw

## 2022-06-13 NOTE — ED Provider Notes (Signed)
[place] EMERGENCY DEPARTMENT AT [facility] Provider Note   CSN: [id number] Arrival date & time: 06/13/22  4098     History {Add pertinent medical, surgical, social history, OB history to HPI:1} Chief Complaint  Patient presents with   Chest Pain    [name] is a 82 y.o. female.  HPI     This is an 82 year old female who presents with chest discomfort.  Patient reports that she woke up this morning from sleep with pressure-like pain on her chest.  She has never had pain like this before.  She does have a history of PE but reports compliance with her Eliquis.  She denies any fevers or shortness of breath.  She states pain is somewhat improved.  It was nonexertional in nature.  No recent cough or fevers.  Home Medications Prior to Admission medications   Medication Sig Start Date End Date Taking? Authorizing Provider  albuterol (VENTOLIN HFA) 108 (90 Base) MCG/ACT inhaler Inhale 1 puff into the lungs as needed for wheezing or shortness of breath.    [provider]  Azelastine HCl (ASTEPRO) 0.15 % SOLN Place 1 spray into the nose every morning. 04/13/22   [name], MD  Calcium Carb-Cholecalciferol (CALCIUM-VITAMIN D) 600-400 MG-UNIT TABS Take 1 tablet by mouth daily.    [provider]  citalopram (CELEXA) 20 MG tablet TAKE 1 TABLET BY MOUTH EVERYDAY AT BEDTIME 02/12/22   [name], NP  donepezil (ARICEPT) 10 MG tablet TAKE 1 TABLET BY MOUTH EVERYDAY AT BEDTIME 02/12/22   Eubanks, Janene Harvey, NP  ELIQUIS 5 MG TABS tablet TAKE 1 TABLET BY MOUTH TWICE A DAY 05/22/22   [name], MD  ferrous sulfate 325 (65 FE) MG EC tablet Take 1 tablet (325 mg total) by mouth daily with breakfast. 01/26/22   [name], NP  fluticasone (FLONASE) 50 MCG/ACT nasal spray INSTILL 1 SPRAY INTO BOTH NOSTRILS DAILY 03/12/22   [name], MD  Fluticasone-Umeclidin-Vilant (TRELEGY ELLIPTA) 100-62.5-25 MCG/ACT AEPB TAKE 1 PUFF BY MOUTH EVERY DAY 09/02/21    [name], MD  guaiFENesin (MUCINEX) 600 MG 12 hr tablet Take 1,200 mg by mouth daily.    [provider]  levothyroxine (SYNTHROID) 88 MCG tablet TAKE 1 TABLET BY MOUTH EVERY DAY IN THE MORNING 11/26/21   [name], NP  losartan (COZAAR) 100 MG tablet TAKE 1 TABLET BY MOUTH EVERY DAY 03/12/22   [name], NP  metFORMIN (GLUCOPHAGE) 500 MG tablet Take 1 tablet (500 mg total) by mouth daily with supper. 04/10/22   [name], NP  montelukast (SINGULAIR) 10 MG tablet TAKE 1 TABLET BY MOUTH EVERYDAY AT BEDTIME 03/12/22   [name], MD  Multiple Vitamin (MULTIVITAMIN WITH MINERALS) TABS tablet Take 1 tablet by mouth daily.    [provider]  pantoprazole (PROTONIX) 40 MG tablet Take 1 tablet (40 mg total) by mouth daily. 04/10/22   [name], NP  rOPINIRole (REQUIP) 1 MG tablet TAKE 1 TABLET BY MOUTH EVERYDAY AT BEDTIME 12/18/21   [name], NP  rosuvastatin (CRESTOR) 10 MG tablet Take 2 tablets (20 mg total) by mouth daily. E78.2 02/19/22   [name], NP  tacrolimus (PROGRAF) 1 MG capsule Take 1 mg by mouth 2 (two) times daily.  01/03/19   [provider]      Allergies    Patient has no known allergies.    Review of Systems   Review of Systems  Constitutional:  Negative for fever.  Respiratory:  Negative for cough and shortness of breath.   Cardiovascular:  Positive for chest pain. Negative for leg swelling.  Gastrointestinal:  Negative for abdominal pain.  All other systems reviewed and are negative.   Physical Exam Updated Vital Signs BP (!) 189/88   Pulse 73   Temp 98.2 F (36.8 C)   Resp (!) 24   SpO2 94%  Physical Exam Vitals and nursing note reviewed.  Constitutional:      Appearance: She is well-developed. She is obese. She is not ill-appearing.  HENT:     Head: Normocephalic and atraumatic.  Eyes:     Pupils: Pupils are equal, round, and reactive to light.  Cardiovascular:     Rate and  Rhythm: Normal rate and regular rhythm.     Heart sounds: Normal heart sounds.  Pulmonary:     Effort: Pulmonary effort is normal. No respiratory distress.     Breath sounds: No wheezing.  Abdominal:     General: Bowel sounds are normal.     Palpations: Abdomen is soft.  Musculoskeletal:     Cervical back: Neck supple.  Skin:    General: Skin is warm and dry.  Neurological:     General: No focal deficit present.     Mental Status: She is alert and oriented to person, place, and time.     ED Results / Procedures / Treatments   Labs (all labs ordered are listed, but only abnormal results are displayed) Labs Reviewed  BASIC METABOLIC PANEL - Abnormal; Notable for the following components:      Result Value   Glucose, Bld 151 (*)    Creatinine, Ser 1.08 (*)    Calcium 8.8 (*)    GFR, Estimated 51 (*)    All other components within normal limits  CBC WITH DIFFERENTIAL/PLATELET - Abnormal; Notable for the following components:   WBC 15.3 (*)    Neutro Abs 12.1 (*)    Monocytes Absolute 1.4 (*)    Abs Immature Granulocytes 0.10 (*)    All other components within normal limits  CBC WITH DIFFERENTIAL/PLATELET  TROPONIN I (HIGH SENSITIVITY)    EKG EKG Interpretation  Date/Time:  Saturday Jun 13 2022 04:05:38 EDT Ventricular Rate:  75 PR Interval:  175 QRS Duration: 95 QT Interval:  438 QTC Calculation: 490 R Axis:   1 Text Interpretation: Sinus rhythm Anterior infarct, old Confirmed by Ross Marcus ([id number]) on 06/13/2022 4:08:15 AM  Radiology DG Chest Portable 1 View  Result Date: 06/13/2022 CLINICAL DATA:  Chest pain. EXAM: PORTABLE CHEST 1 VIEW COMPARISON:  Portable chest 02/11/2019 FINDINGS: There is mild-to-moderate cardiomegaly. No vascular congestion is seen. No pleural collections. Mild chronic elevation right hemidiaphragm. The lungs are clear. Stable mediastinum with aortic tortuosity and atherosclerosis. There is overlying monitor wiring. No acute osseous  findings. Osteopenia. Compare: Unchanged. IMPRESSION: No acute chest findings. Stable chest with cardiomegaly and aortic uncoiling and atherosclerosis. Electronically Signed   By: Almira Bar M.D.   On: 06/13/2022 04:35    Procedures Procedures  {Document cardiac monitor, telemetry assessment procedure when appropriate:1}  Medications Ordered in ED Medications - No data to display  ED Course/ Medical Decision Making/ A&P   {   Click here for ABCD2, HEART and other calculatorsREFRESH Note before signing :1}                          Medical Decision Making Amount and/or Complexity of  Data Reviewed Labs: ordered. Radiology: ordered.   ***  {Document critical care time when appropriate:1} {Document review of labs and clinical decision tools ie heart score, Chads2Vasc2 etc:1}  {Document your independent review of radiology images, and any outside records:1} {Document your discussion with family members, caretakers, and with consultants:1} {Document social determinants of health affecting pt's care:1} {Document your decision making why or why not admission, treatments were needed:1} Final Clinical Impression(s) / ED Diagnoses Final diagnoses:  None    Rx / DC Orders ED Discharge Orders     None

## 2022-06-14 DIAGNOSIS — K859 Acute pancreatitis without necrosis or infection, unspecified: Secondary | ICD-10-CM | POA: Diagnosis not present

## 2022-06-14 LAB — CBC
HCT: 41 % (ref 36.0–46.0)
Hemoglobin: 12.3 g/dL (ref 12.0–15.0)
MCH: 27.5 pg (ref 26.0–34.0)
MCHC: 30 g/dL (ref 30.0–36.0)
MCV: 91.5 fL (ref 80.0–100.0)
Platelets: 269 10*3/uL (ref 150–400)
RBC: 4.48 MIL/uL (ref 3.87–5.11)
RDW: 15.9 % — ABNORMAL HIGH (ref 11.5–15.5)
WBC: 11.1 10*3/uL — ABNORMAL HIGH (ref 4.0–10.5)
nRBC: 0 % (ref 0.0–0.2)

## 2022-06-14 LAB — COMPREHENSIVE METABOLIC PANEL
ALT: 22 U/L (ref 0–44)
AST: 23 U/L (ref 15–41)
Albumin: 3.2 g/dL — ABNORMAL LOW (ref 3.5–5.0)
Alkaline Phosphatase: 39 U/L (ref 38–126)
Anion gap: 7 (ref 5–15)
BUN: 15 mg/dL (ref 8–23)
CO2: 29 mmol/L (ref 22–32)
Calcium: 8 mg/dL — ABNORMAL LOW (ref 8.9–10.3)
Chloride: 104 mmol/L (ref 98–111)
Creatinine, Ser: 0.9 mg/dL (ref 0.44–1.00)
GFR, Estimated: >60 mL/min (ref >60)
Glucose, Bld: 127 mg/dL — ABNORMAL HIGH (ref 70–99)
Potassium: 4.4 mmol/L (ref 3.5–5.1)
Sodium: 140 mmol/L (ref 135–145)
Total Bilirubin: 0.5 mg/dL (ref 0.3–1.2)
Total Protein: 6.9 g/dL (ref 6.5–8.1)

## 2022-06-14 MED ORDER — IPRATROPIUM BROMIDE 0.03 % NA SOLN
2.0000 | Freq: Two times a day (BID) | NASAL | Status: DC
  Administered 2022-06-14 – 2022-06-21 (×12): 2 via NASAL
  Filled 2022-06-14: qty 30

## 2022-06-14 MED ORDER — ROPINIROLE HCL 1 MG PO TABS
1.0000 mg | ORAL_TABLET | Freq: Every day | ORAL | Status: DC
  Administered 2022-06-14 – 2022-06-20 (×7): 1 mg via ORAL
  Filled 2022-06-14 (×7): qty 1

## 2022-06-14 MED ORDER — DONEPEZIL HCL 10 MG PO TABS
10.0000 mg | ORAL_TABLET | Freq: Every day | ORAL | Status: DC
  Administered 2022-06-14 – 2022-06-20 (×7): 10 mg via ORAL
  Filled 2022-06-14 (×7): qty 1

## 2022-06-14 MED ORDER — ALBUTEROL SULFATE HFA 108 (90 BASE) MCG/ACT IN AERS: 1.0000 | INHALATION_SPRAY | RESPIRATORY_TRACT | Status: DC | PRN

## 2022-06-14 MED ORDER — LEVOTHYROXINE SODIUM 88 MCG PO TABS
88.0000 ug | ORAL_TABLET | Freq: Every day | ORAL | Status: DC
  Administered 2022-06-16 – 2022-06-21 (×6): 88 ug via ORAL
  Filled 2022-06-14 (×6): qty 1

## 2022-06-14 MED ORDER — METOPROLOL TARTRATE 25 MG PO TABS
12.5000 mg | ORAL_TABLET | Freq: Two times a day (BID) | ORAL | Status: DC
  Administered 2022-06-14 – 2022-06-15 (×3): 12.5 mg via ORAL
  Filled 2022-06-14 (×3): qty 1

## 2022-06-14 MED ORDER — ROSUVASTATIN CALCIUM 20 MG PO TABS
20.0000 mg | ORAL_TABLET | Freq: Every day | ORAL | Status: DC
  Administered 2022-06-14 – 2022-06-21 (×7): 20 mg via ORAL
  Filled 2022-06-14 (×7): qty 1

## 2022-06-14 MED ORDER — UMECLIDINIUM BROMIDE 62.5 MCG/ACT IN AEPB
1.0000 | INHALATION_SPRAY | Freq: Every day | RESPIRATORY_TRACT | Status: DC
  Administered 2022-06-14 – 2022-06-21 (×8): 1 via RESPIRATORY_TRACT
  Filled 2022-06-14 (×2): qty 7

## 2022-06-14 MED ORDER — LOSARTAN POTASSIUM 50 MG PO TABS
100.0000 mg | ORAL_TABLET | Freq: Every day | ORAL | Status: DC
  Administered 2022-06-14 – 2022-06-21 (×8): 100 mg via ORAL
  Filled 2022-06-14 (×8): qty 2

## 2022-06-14 MED ORDER — MONTELUKAST SODIUM 10 MG PO TABS
10.0000 mg | ORAL_TABLET | Freq: Every day | ORAL | Status: DC
  Administered 2022-06-14 – 2022-06-20 (×7): 10 mg via ORAL
  Filled 2022-06-14 (×7): qty 1

## 2022-06-14 MED ORDER — FLUTICASONE FUROATE-VILANTEROL 100-25 MCG/ACT IN AEPB
1.0000 | INHALATION_SPRAY | Freq: Every day | RESPIRATORY_TRACT | Status: DC
  Administered 2022-06-14 – 2022-06-21 (×8): 1 via RESPIRATORY_TRACT
  Filled 2022-06-14: qty 28

## 2022-06-14 MED ORDER — PANTOPRAZOLE SODIUM 40 MG PO TBEC
40.0000 mg | DELAYED_RELEASE_TABLET | Freq: Every day | ORAL | Status: DC
  Administered 2022-06-14 – 2022-06-21 (×7): 40 mg via ORAL
  Filled 2022-06-14 (×7): qty 1

## 2022-06-14 MED ORDER — CITALOPRAM HYDROBROMIDE 20 MG PO TABS
20.0000 mg | ORAL_TABLET | Freq: Every day | ORAL | Status: DC
  Administered 2022-06-14 – 2022-06-21 (×7): 20 mg via ORAL
  Filled 2022-06-14 (×7): qty 1

## 2022-06-14 MED ORDER — TACROLIMUS 1 MG PO CAPS
1.0000 mg | ORAL_CAPSULE | Freq: Two times a day (BID) | ORAL | Status: DC
  Administered 2022-06-14 – 2022-06-21 (×15): 1 mg via ORAL
  Filled 2022-06-14 (×15): qty 1

## 2022-06-14 MED ORDER — ALBUTEROL SULFATE (2.5 MG/3ML) 0.083% IN NEBU: 2.5000 mg | INHALATION_SOLUTION | Freq: Four times a day (QID) | RESPIRATORY_TRACT | Status: DC | PRN

## 2022-06-14 MED ORDER — LACTATED RINGERS IV SOLN
INTRAVENOUS | Status: DC
  Administered 2022-06-14: 150 mL/h via INTRAVENOUS

## 2022-06-14 NOTE — Progress Notes (Signed)
  Transition of Care [facility]) Screening Note   Patient Details  Name: [name] Date of Birth: [birth year]   Transition of Care [facility]) CM/SW Contact:    Adrian Prows, RN Phone Number: 06/14/2022, 2:22 PM    Transition of Care Department [facility]) has reviewed patient and no TOC needs have been identified at this time. We will continue to monitor patient advancement through interdisciplinary progression rounds. If new patient transition needs arise, please place a TOC consult.

## 2022-06-14 NOTE — Progress Notes (Signed)
PROGRESS NOTE    [name]  AOZ:[id number] DOB: 05-Jun-1940 DOA: 06/13/2022 PCP: [name], NP  Chief Complaint  Patient presents with   Chest Pain    Brief Narrative:   [name] is [name] 82 y.o. female with medical history significant of COPD, GERD, hypothyroidism, essential hypertension, hyperlipidemia, hyperlipidemia, asthma, history of pulmonary embolism on Eliquis who presented to med [facility] with chest and epigastric pain.  Found to have pancreatitis.  Assessment & Plan:   Principal Problem:   Acute pancreatitis Active Problems:   Hypothyroidism   Essential hypertension, benign   Mixed hyperlipidemia   COPD (chronic obstructive pulmonary disease) (HCC)   GERD without esophagitis   Pulmonary embolism (HCC)   OSA (obstructive sleep apnea)  Gallstone Pancreatitis  Choledocholithiasis  Cholelithiasis Lipased >3000, CT with acute pancreatitis, small calculi in region of distal common bile duct.  Cholelithiasis and probable choledocholithiasis.  Normal LFT's. Some meds have association with pancreatitis, but with chololithiasis and choledocholithiasis -> more likely cause Pain seems to be improving Continue aggressive IVF GI consult, appreciate recs Will eventually need to discuss with surgery  Atrial Tachycardia Tele with runs of atrial tachy and sinus rhythm  Will start metoprolol 12.5 mg BID Enlarged L atrium on CT Will follow echo I think cardiac monitoring after discharge may be warranted with cards follow up Continue tele  Hypertension Follow BP on losartan and metoprolol   Minimal Change Disease  CKD  Tacrolimus  COPD Continue controller med, prn rescue inhaler  Hx PE Eliquis on hold with need for possible procedure - ok to hold, 1x PE, ~4 years ago per her report - lovenox for now  Hypothyroidism Synthroid  Dyslipidemia statin    DVT prophylaxis: lovenxo Code Status: full Family Communication:  none Disposition:   Status is: Inpatient Remains inpatient appropriate because: need for GI c/s, continued workup    Consultants:  GI  Procedures:  none  Antimicrobials:  Anti-infectives (From admission, onward)    None       Subjective: No new complaints  Objective: Vitals:   06/13/22 2154 06/14/22 0229 06/14/22 0601 06/14/22 1143  BP: (!) 175/83 (!) 160/96 (!) 169/92 (!) 169/83  Pulse: 71 69  69  Resp: 19 19    Temp: 98.9 F (37.2 C) 98.5 F (36.9 C) 98.9 F (37.2 C)   TempSrc: Oral Oral Oral   SpO2: 96% 97% 98%   Weight:  104.6 kg    Height:  5\' 8"  (1.727 m)      Intake/Output Summary (Last 24 hours) at 06/14/2022 1149 Last data filed at 06/14/2022 0700 Gross per 24 hour  Intake 2370.14 ml  Output --  Net 2370.14 ml   Filed Weights   06/14/22 0229  Weight: 104.6 kg    Examination:  General exam: Appears calm and comfortable  Respiratory system: unlabored Cardiovascular system: tachycardia, regular Gastrointestinal system: mild epigastric TTP  Central nervous system: Alert and oriented. No focal neurological deficits. Extremities: no LEE   Data Reviewed: I have personally reviewed following labs and imaging studies  CBC: Recent Labs  Lab 06/13/22 0504 06/13/22 1857 06/14/22 0417  WBC 15.3* 11.6* 11.1*  NEUTROABS 12.1*  --   --   HGB 13.0 12.2 12.3  HCT 40.7 40.2 41.0  MCV 86.4 90.3 91.5  PLT 296 262 269    Basic Metabolic Panel: Recent Labs  Lab 06/13/22 0409 06/13/22 0800 06/13/22 1857 06/14/22 0417  NA 141  --   --  140  K 3.6  --   --  4.4  CL 103  --   --  104  CO2 28  --   --  29  GLUCOSE 151*  --   --  127*  BUN 20  --   --  15  CREATININE 1.08*  --  0.90 0.90  CALCIUM 8.8*  --   --  8.0*  MG  --  1.6*  --   --   PHOS  --  3.6  --   --     GFR: Estimated Creatinine Clearance: 61 mL/min (by C-G formula based on SCr of 0.9 mg/dL).  Liver Function Tests: Recent Labs  Lab 06/13/22 0800 06/14/22 0417  AST 42* 23   ALT 23 22  ALKPHOS 38 39  BILITOT 0.5 0.5  PROT 6.5 6.9  ALBUMIN 3.4* 3.2*    CBG: No results for input(s): "GLUCAP" in the last 168 hours.   No results found for this or any previous visit (from the past 240 hour(s)).       Radiology Studies: CT ABDOMEN PELVIS WO CONTRAST  Result Date: 06/13/2022 CLINICAL DATA:  Pancreatitis, acute, severe. EXAM: CT ABDOMEN AND PELVIS WITHOUT CONTRAST TECHNIQUE: Multidetector CT imaging of the abdomen and pelvis was performed following the standard protocol without IV contrast. RADIATION DOSE REDUCTION: This exam was performed according to the departmental dose-optimization program which includes automated exposure control, adjustment of the mA and/or kV according to patient size and/or use of iterative reconstruction technique. COMPARISON:  CTA chest 06/13/2022.  Lumbar spine CT 05/08/2017 FINDINGS: Lower chest: Lung bases are clear.  No pleural effusions. Hepatobiliary: At least 3 hypodense structures associated with the liver that probably represent cysts. One hypodensity is exophytic along the inferior right hepatic lobe measuring up to 2.6 cm. Gallbladder is mildly distended with layering stones. Mild stranding near the base of the gallbladder. Common bile duct measures roughly 8 mm but there is concern for 2 small calculi in the distal aspect of the common bile duct best seen on the coronal images, image 59/5. This finding is suggestive for choledocholithiasis. In addition, there may be Leomar Westberg small stone near the cystic duct on image 28/2. Pancreas: Diffuse stranding around the pancreas compatible with acute pancreatitis. No significant pancreatic duct dilatation. Cannot evaluate for pancreatic necrosis on this exam without vascular contrast. Spleen: Spleen is normal for size.  No gross abnormality. Adrenals/Urinary Tract: Normal adrenal glands. Contrast in the renal collecting system compatible with recent chest CTA. Negative for hydronephrosis. No  suspicious renal lesions. Normal appearance of the urinary bladder. Stomach/Bowel: Small hiatal hernia. Diverticula involving the sigmoid colon without acute inflammatory changes. No evidence for bowel obstruction. Vascular/Lymphatic: Aortic atherosclerotic calcifications without aneurysm. Retroaortic left renal vein which is Bradleigh Sonnen normal variant. No significant lymph node enlargement in the abdomen or pelvis. Reproductive: Status post hysterectomy. No adnexal masses. Other: Stranding and inflammatory changes centered around the pancreas and porta hepatis. No discrete fluid collections. No evidence for Hailey Stormer pseudocyst formation at this time. No free fluid in the pelvis. Negative for free air. Small umbilical hernia containing fat. Musculoskeletal: Old compression fracture involving L1 vertebral body. Chronic disc space narrowing at L5-S1. No acute bone abnormality. IMPRESSION: 1. Acute pancreatitis with inflammatory changes centered around the pancreas and porta hepatis. Small calculi in the region of the distal common bile duct and findings could represent gallstone pancreatitis. 2. Cholelithiasis and probable choledocholithiasis. 3. Hepatic cysts. 4. Small hiatal hernia. 5. Aortic Atherosclerosis (ICD10-I70.0). Electronically  Signed   By: Richarda Overlie M.D.   On: 06/13/2022 09:04   CT Angio Chest PE W and/or Wo Contrast  Result Date: 06/13/2022 CLINICAL DATA:  Pulmonary embolism suspected, high probability. Chest pain. EXAM: CT ANGIOGRAPHY CHEST WITH CONTRAST TECHNIQUE: Multidetector CT imaging of the chest was performed using the standard protocol during bolus administration of intravenous contrast. Multiplanar CT image reconstructions and MIPs were obtained to evaluate the vascular anatomy. RADIATION DOSE REDUCTION: This exam was performed according to the departmental dose-optimization program which includes automated exposure control, adjustment of the mA and/or kV according to patient size and/or use of iterative  reconstruction technique. CONTRAST:  75mL OMNIPAQUE IOHEXOL 350 MG/ML SOLN COMPARISON:  07/14/2019 FINDINGS: Cardiovascular: Negative for pulmonary embolism. Heart size is prominent and left atrium appears to be enlarged measuring 5.0 cm in the AP dimension. No significant pericardial effusion. Small amount of calcium involving the LAD. Normal caliber of the thoracic aorta. Incidentally, the patient has Ourania Hamler bovine type arch. Mediastinum/Nodes: Small hiatal hernia. No significant mediastinal or hilar lymph node enlargement. No axillary lymph node enlargement. Lungs/Pleura: Again noted is centrilobular emphysema. Chronic volume loss along the anterior aspect of the right minor fissure on image 73/6. Patchy dependent densities in both lungs particularly in the left lower lobe. Findings are suggestive for atelectasis. No pleural effusions. Upper Abdomen: Question stranding near the pancreatic tail and splenic hilum but this area is incompletely imaged. Musculoskeletal: No acute bone abnormality. Again noted is elevation of the right hemidiaphragm. Review of the MIP images confirms the above findings. IMPRESSION: 1. Negative for pulmonary embolism. 2. Question stranding near the pancreatic tail and splenic hilum. This area is incompletely imaged. Recommend clinical correlation and consider further characterization with Azia Toutant dedicated CT of the abdomen and pelvis. 3. Patchy dependent densities in both lungs are most compatible with atelectasis. 4. Enlarged left atrium. Consider further characterization with echocardiogram. 5. Small hiatal hernia. 6. Emphysema (ICD10-J43.9). Electronically Signed   By: Richarda Overlie M.D.   On: 06/13/2022 07:31   DG Chest Portable 1 View  Result Date: 06/13/2022 CLINICAL DATA:  Chest pain. EXAM: PORTABLE CHEST 1 VIEW COMPARISON:  Portable chest 02/11/2019 FINDINGS: There is mild-to-moderate cardiomegaly. No vascular congestion is seen. No pleural collections. Mild chronic elevation right  hemidiaphragm. The lungs are clear. Stable mediastinum with aortic tortuosity and atherosclerosis. There is overlying monitor wiring. No acute osseous findings. Osteopenia. Compare: Unchanged. IMPRESSION: No acute chest findings. Stable chest with cardiomegaly and aortic uncoiling and atherosclerosis. Electronically Signed   By: Almira Bar M.D.   On: 06/13/2022 04:35        Scheduled Meds:  enoxaparin (LOVENOX) injection  40 mg Subcutaneous Q24H   losartan  100 mg Oral Daily   metoprolol tartrate  12.5 mg Oral BID   Continuous Infusions:  lactated ringers 150 mL/hr at 06/14/22 0808     LOS: 1 day    Time spent: over 30 min    Lacretia Nicks, MD [facility]   To contact the attending provider between 7A-7P or the covering provider during after hours 7P-7A, please log into the web site [email or web address] and access using universal [place] password for that web site. If you do not have the password, please call the hospital operator.  06/14/2022, 11:49 AM

## 2022-06-14 NOTE — Plan of Care (Signed)
  Problem: Education: Goal: Knowledge of General Education information will improve Description: Including pain rating scale, medication(s)/side effects and non-pharmacologic comfort measures 06/14/2022 0806 by Val Eagle, RN Outcome: Progressing 06/13/2022 1903 by Val Eagle, RN Outcome: Progressing

## 2022-06-14 NOTE — Consult Note (Signed)
[facility] Consult  Referring Provider: [facility] Primary Care Physician:  [name], NP Primary Gastroenterologist: Gentry Fitz (states she saw Dr.Mann >15 years ago)  Reason for Consultation: Gallstone pancreatitis, CBD stones  HPI: [name] is a 82 y.o. female was in her usual state of health until Friday when she woke up at 2 AM with severe sternal chest pressure.  This was associated with vomiting and a bowel movement. Since the pain did not improve her daughter brought her to [facility] where she was found to have abnormal labs showing elevated lipase and imaging showing pancreatitis with small stone in distal CBD.  Patient states that pain fluctuates from 5-10 out of 10 in intensity. She has not had any bowel movement since presentation to the ER. Denies radiation of pain. Has not had any further nausea or vomiting. She occasionally has acid reflux but denies difficulty swallowing or pain on swallowing. She takes Eliquis for history of blood clot, pulmonary embolism 4 years ago.  Prior GI workup: Patient states she has had a colonoscopy with Dr. Loreta Ave more than 15 years ago, no repeat was recommended due to age.     Past Medical History:  Diagnosis Date   Actinic keratosis    Arthritis    Asthma    Back injury 2019   Fracture   Basal cell carcinoma    COPD (chronic obstructive pulmonary disease) (HCC)    GERD (gastroesophageal reflux disease)    H/O blood clots 2020   Lung, Per [facility] New Patient Packet   H/O mammogram 2022   Per [facility] New Patient Packet   Heart murmur    hx of in childhood    High cholesterol    Hyperlipidemia    Hypertension    Hypothyroidism    Kidney disease    Per [facility] New Patient Packet   MCNS (minimal change nephrotic syndrome)    OSA (obstructive sleep apnea) 08/29/2019   Osteopenia    Shortness of breath dyspnea    on exertion    Squamous cell carcinoma    Thyroid disease    Per [facility] New  Patient Packet    Past Surgical History:  Procedure Laterality Date   ABDOMINAL HYSTERECTOMY     APPENDECTOMY     BILATERAL SALPINGOOPHORECTOMY     Ovarian Cysts    COLONOSCOPY  2001   Dr.Mann, Per [facility] New Patient Packet   CONVERSION TO TOTAL KNEE Right 07/10/2014   Procedure: RIGHT CONVERSION OF PARTIAL KNEE TO TOTAL KNEE;  Surgeon: [name], MD;  Location: [facility] ORS;  Service: Orthopedics;  Laterality: Right;   IR ANGIOGRAM PULMONARY BILATERAL SELECTIVE  02/08/2019   IR ANGIOGRAM SELECTIVE EACH ADDITIONAL VESSEL  02/08/2019   IR ANGIOGRAM SELECTIVE EACH ADDITIONAL VESSEL  02/08/2019   IR INFUSION THROMBOL ARTERIAL INITIAL (MS)  02/08/2019   IR INFUSION THROMBOL ARTERIAL INITIAL (MS)  02/08/2019   IR THROMB F/U EVAL ART/VEN FINAL DAY (MS)  02/09/2019   IR US GUIDE VASC ACCESS RIGHT  02/08/2019   MEDIAL PARTIAL KNEE REPLACEMENT Bilateral    MOHS SURGERY     x 2   RENAL BIOPSY     x 2   REPLACEMENT TOTAL KNEE  2012   Per [facility] New Patient Packet   ROTATOR CUFF REPAIR Left 2008    Prior to Admission medications   Medication Sig Start Date End Date Taking? Authorizing Provider  albuterol (VENTOLIN HFA) 108 (90 Base) MCG/ACT inhaler Inhale 1 puff into the lungs  as needed for wheezing or shortness of breath.   Yes [provider]  Calcium Carb-Cholecalciferol (CALCIUM-VITAMIN D) 600-400 MG-UNIT TABS Take 1 tablet by mouth daily.   Yes [provider]  citalopram (CELEXA) 20 MG tablet TAKE 1 TABLET BY MOUTH EVERYDAY AT BEDTIME 02/12/22  Yes [name], NP  donepezil (ARICEPT) 10 MG tablet TAKE 1 TABLET BY MOUTH EVERYDAY AT BEDTIME 02/12/22  Yes Eubanks, Janene Harvey, NP  ELIQUIS 5 MG TABS tablet TAKE 1 TABLET BY MOUTH TWICE A DAY 05/22/22  Yes [name], MD  ferrous sulfate 325 (65 FE) MG EC tablet Take 1 tablet (325 mg total) by mouth daily with breakfast. 01/26/22  Yes Eubanks, Janene Harvey, NP  fluticasone (FLONASE) 50 MCG/ACT nasal spray INSTILL 1 SPRAY INTO BOTH  NOSTRILS DAILY 03/12/22  Yes [name], MD  Fluticasone-Umeclidin-Vilant (TRELEGY ELLIPTA) 100-62.5-25 MCG/ACT AEPB TAKE 1 PUFF BY MOUTH EVERY DAY 09/02/21  Yes [name], MD  guaiFENesin (MUCINEX) 600 MG 12 hr tablet Take 1,200 mg by mouth daily.   Yes [provider]  ipratropium (ATROVENT) 0.03 % nasal spray Place 2 sprays into both nostrils 2 (two) times daily.   Yes [provider]  levothyroxine (SYNTHROID) 88 MCG tablet TAKE 1 TABLET BY MOUTH EVERY DAY IN THE MORNING 11/26/21  Yes [name], NP  losartan (COZAAR) 100 MG tablet TAKE 1 TABLET BY MOUTH EVERY DAY 03/12/22  Yes [name], NP  metFORMIN (GLUCOPHAGE) 500 MG tablet Take 1 tablet (500 mg total) by mouth daily with supper. 04/10/22  Yes [name], NP  montelukast (SINGULAIR) 10 MG tablet TAKE 1 TABLET BY MOUTH EVERYDAY AT BEDTIME 03/12/22  Yes [name], MD  Multiple Vitamin (MULTIVITAMIN WITH MINERALS) TABS tablet Take 1 tablet by mouth daily.   Yes [provider]  pantoprazole (PROTONIX) 40 MG tablet Take 1 tablet (40 mg total) by mouth daily. 04/10/22  Yes [name], NP  rOPINIRole (REQUIP) 1 MG tablet TAKE 1 TABLET BY MOUTH EVERYDAY AT BEDTIME 12/18/21  Yes [name], NP  rosuvastatin (CRESTOR) 10 MG tablet Take 2 tablets (20 mg total) by mouth daily. E78.2 02/19/22  Yes [name], NP  tacrolimus (PROGRAF) 1 MG capsule Take 1 mg by mouth 2 (two) times daily.  01/03/19  Yes [provider]  Azelastine HCl (ASTEPRO) 0.15 % SOLN Place 1 spray into the nose every morning. Patient not taking: Reported on 06/13/2022 04/13/22   [name], MD    Current Facility-Administered Medications  Medication Dose Route Frequency Provider Last Rate Last Admin   albuterol (PROVENTIL) (2.5 MG/3ML) 0.083% nebulizer solution 2.5 mg  2.5 mg Nebulization Q6H PRN Zigmund Daniel., MD       citalopram (CELEXA) tablet 20 mg  20 mg Oral Daily Zigmund Daniel.,  MD       donepezil (ARICEPT) tablet 10 mg  10 mg Oral QHS Zigmund Daniel., MD       enoxaparin (LOVENOX) injection 40 mg  40 mg Subcutaneous Q24H Earlie Lou L, MD   40 mg at 06/13/22 2132   fluticasone furoate-vilanterol (BREO ELLIPTA) 100-25 MCG/ACT 1 puff  1 puff Inhalation Daily Zigmund Daniel., MD       And   umeclidinium bromide (INCRUSE ELLIPTA) 62.5 MCG/ACT 1 puff  1 puff Inhalation Daily Zigmund Daniel., MD       HYDROmorphone (DILAUDID) injection 0.5 mg  0.5 mg Intravenous Q2H  PRN Rometta Emery, MD   0.5 mg at 06/14/22 1125   ipratropium (ATROVENT) 0.03 % nasal spray 2 spray  2 spray Each Nare BID Zigmund Daniel., MD       lactated ringers infusion   Intravenous Continuous Zigmund Daniel., MD 150 mL/hr at 06/14/22 0808 New Bag at 06/14/22 0808   [START ON 06/15/2022] levothyroxine (SYNTHROID) tablet 88 mcg  88 mcg Oral Q0600 Zigmund Daniel., MD       losartan (COZAAR) tablet 100 mg  100 mg Oral Daily Zigmund Daniel., MD   100 mg at 06/14/22 9147   metoprolol tartrate (LOPRESSOR) tablet 12.5 mg  12.5 mg Oral BID Zigmund Daniel., MD   12.5 mg at 06/14/22 1136   montelukast (SINGULAIR) tablet 10 mg  10 mg Oral QHS Zigmund Daniel., MD       ondansetron [facility]) tablet 4 mg  4 mg Oral Q6H PRN Rometta Emery, MD       Or   ondansetron (ZOFRAN) injection 4 mg  4 mg Intravenous Q6H PRN Rometta Emery, MD       pantoprazole (PROTONIX) EC tablet 40 mg  40 mg Oral Daily Zigmund Daniel., MD       rOPINIRole (REQUIP) tablet 1 mg  1 mg Oral QHS Zigmund Daniel., MD       rosuvastatin (CRESTOR) tablet 20 mg  20 mg Oral Daily Zigmund Daniel., MD       tacrolimus (PROGRAF) capsule 1 mg  1 mg Oral BID Zigmund Daniel., MD        Allergies as of 06/13/2022   (No Known Allergies)    Family History  Problem Relation Age of Onset   COPD Mother        Husband Smoked    Lymphoma Father     Hypertension Sister    Scoliosis Daughter    Stroke Maternal Grandfather    Rheumatic fever Paternal Grandmother    Heart disease Paternal Grandfather     Social History   Socioeconomic History   Marital status: Widowed    Spouse name: Not on file   Number of children: 2   Years of education: 16   Highest education level: Not on file  Occupational History   Occupation: TEACHER     Employer: [employer]    Comment: RETIRED   Tobacco Use   Smoking status: Former    Packs/day: 1.50    Years: 25.00    Additional pack years: 0.00    Total pack years: 37.50    Types: Cigarettes    Quit date: 01/12/1973    Years since quitting: 49.4   Smokeless tobacco: Never  Vaping Use   Vaping Use: Never used  Substance and Sexual Activity   Alcohol use: Yes    Alcohol/week: 0.5 standard drinks of alcohol    Types: 1 Standard drinks or equivalent per week    Comment: She occasionally drinks a glass of wine.     Drug use: No   Sexual activity: Not Currently    Partners: Male  Other Topics Concern   Not on file  Social History Narrative   Marital Status:  Widowed   Siblings: Kitty Simmons/ Rachel RuthChildren:  2;  Grandchildren:  5 Pets: NoneLiving Situation: Lives aloneOccupation: Retired TeacherEducation: Engineer, maintenance (IT) Tobacco Use/Exposure:  She quit smoking > 20 years ago after having smoked 1 1/2  ppd for over 20 years.  Alcohol Use:  Occasional (Wine) Drug Use:  NoneDiet:  RegularExercise:  Water Aerobic Class 3 days a week and WeightsHobbies: Reading/ Crafts/ Biking/ Reading]      Per [facility] New patient packet abstracted on 11/01/2020   Diet: Left blank      Caffeine: Yes      Married, if yes what year: Widow, married in 1961      Do you live in a house, apartment, assisted living, condo, trailer, ect: Patient did not answer correctly (lived alone until 10/1/20222)      Is it one or more stories: 2      How many persons live in your home? 2      Pets: cat       Highest level or education completed: 1 year of college       Current/Past profession: Geologist, engineering       Exercise:      No            Type and how often:          Living Will: Yes   DNR: Yes   POA/HPOA: Yes      Functional Status:   Do you have difficulty bathing or dressing yourself?  Left blank   Do you have difficulty preparing food or eating?Left blank   Do you have difficulty managing your medications?Left blank   Do you have difficulty managing your finances?Left blank   Do you have difficulty affording your medications?Left blank   Social Determinants of Health   Financial Resource Strain: Not on file  Food Insecurity: No Food Insecurity (06/13/2022)   Hunger Vital Sign    Worried About Running Out of Food in the Last Year: Never true    Ran Out of Food in the Last Year: Never true  Transportation Needs: No Transportation Needs (06/13/2022)   PRAPARE - Administrator, Civil Service (Medical): No    Lack of Transportation (Non-Medical): No  Physical Activity: Not on file  Stress: Not on file  Social Connections: Not on file  Intimate Partner Violence: Not At Risk (06/13/2022)   Humiliation, Afraid, Rape, and Kick questionnaire    Fear of Current or Ex-Partner: No    Emotionally Abused: No    Physically Abused: No    Sexually Abused: No    Review of Systems: As per HPI  Physical Exam: Vital signs in last 24 hours: Temp:  [98 F (36.7 C)-98.9 F (37.2 C)] 98.9 F (37.2 C) (05/05 0601) Pulse Rate:  [69-71] 69 (05/05 1143) Resp:  [16-25] 19 (05/05 0229) BP: (160-175)/(83-107) 169/83 (05/05 1143) SpO2:  [94 %-98 %] 98 % (05/05 0601) Weight:  [104.6 kg] 104.6 kg (05/05 0229) Last BM Date : 06/12/22  General:   Alert,  Well-developed, overweight, pleasant and cooperative in NAD Head:  Normocephalic and atraumatic. Eyes:  Sclera clear, no icterus.   Conjunctiva pink. Ears:  Normal auditory acuity. Nose:  No deformity, discharge,  or lesions. Mouth:   No deformity or lesions.  Oropharynx pink & moist. Neck:  Supple; no masses or thyromegaly. Lungs:  Clear throughout to auscultation.   No wheezes, crackles, or rhonchi. No acute distress. Heart:  Regular rate and rhythm; no murmurs, clicks, rubs,  or gallops. Extremities:  Without clubbing or edema. Neurologic:  Alert and  oriented x4;  grossly normal neurologically. Skin:  Intact without significant lesions or rashes. Psych:  Alert and cooperative. Normal mood and affect.  Abdomen:  Soft, mild tenderness in epigastric and right upper quadrant without rebound, rigidity or guarding and nondistended. No masses, hepatosplenomegaly or hernias noted. Normal bowel sounds, without guarding, and without rebound.         Lab Results: Recent Labs    06/13/22 0504 06/13/22 1857 06/14/22 0417  WBC 15.3* 11.6* 11.1*  HGB 13.0 12.2 12.3  HCT 40.7 40.2 41.0  PLT 296 262 269   BMET Recent Labs    06/13/22 0409 06/13/22 1857 06/14/22 0417  NA 141  --  140  K 3.6  --  4.4  CL 103  --  104  CO2 28  --  29  GLUCOSE 151*  --  127*  BUN 20  --  15  CREATININE 1.08* 0.90 0.90  CALCIUM 8.8*  --  8.0*   LFT Recent Labs    06/13/22 0800 06/14/22 0417  PROT 6.5 6.9  ALBUMIN 3.4* 3.2*  AST 42* 23  ALT 23 22  ALKPHOS 38 39  BILITOT 0.5 0.5  BILIDIR 0.2  --   IBILI 0.3  --    PT/INR No results for input(s): "LABPROT", "INR" in the last 72 hours.  Studies/Results: CT ABDOMEN PELVIS WO CONTRAST  Result Date: 06/13/2022 CLINICAL DATA:  Pancreatitis, acute, severe. EXAM: CT ABDOMEN AND PELVIS WITHOUT CONTRAST TECHNIQUE: Multidetector CT imaging of the abdomen and pelvis was performed following the standard protocol without IV contrast. RADIATION DOSE REDUCTION: This exam was performed according to the departmental dose-optimization program which includes automated exposure control, adjustment of the mA and/or kV according to patient size and/or use of iterative reconstruction technique.  COMPARISON:  CTA chest 06/13/2022.  Lumbar spine CT 05/08/2017 FINDINGS: Lower chest: Lung bases are clear.  No pleural effusions. Hepatobiliary: At least 3 hypodense structures associated with the liver that probably represent cysts. One hypodensity is exophytic along the inferior right hepatic lobe measuring up to 2.6 cm. Gallbladder is mildly distended with layering stones. Mild stranding near the base of the gallbladder. Common bile duct measures roughly 8 mm but there is concern for 2 small calculi in the distal aspect of the common bile duct best seen on the coronal images, image 59/5. This finding is suggestive for choledocholithiasis. In addition, there may be a small stone near the cystic duct on image 28/2. Pancreas: Diffuse stranding around the pancreas compatible with acute pancreatitis. No significant pancreatic duct dilatation. Cannot evaluate for pancreatic necrosis on this exam without vascular contrast. Spleen: Spleen is normal for size.  No gross abnormality. Adrenals/Urinary Tract: Normal adrenal glands. Contrast in the renal collecting system compatible with recent chest CTA. Negative for hydronephrosis. No suspicious renal lesions. Normal appearance of the urinary bladder. Stomach/Bowel: Small hiatal hernia. Diverticula involving the sigmoid colon without acute inflammatory changes. No evidence for bowel obstruction. Vascular/Lymphatic: Aortic atherosclerotic calcifications without aneurysm. Retroaortic left renal vein which is a normal variant. No significant lymph node enlargement in the abdomen or pelvis. Reproductive: Status post hysterectomy. No adnexal masses. Other: Stranding and inflammatory changes centered around the pancreas and porta hepatis. No discrete fluid collections. No evidence for a pseudocyst formation at this time. No free fluid in the pelvis. Negative for free air. Small umbilical hernia containing fat. Musculoskeletal: Old compression fracture involving L1 vertebral body.  Chronic disc space narrowing at L5-S1. No acute bone abnormality. IMPRESSION: 1. Acute pancreatitis with inflammatory changes centered around the pancreas and porta hepatis. Small calculi in the region of the distal common bile duct and findings could  represent gallstone pancreatitis. 2. Cholelithiasis and probable choledocholithiasis. 3. Hepatic cysts. 4. Small hiatal hernia. 5. Aortic Atherosclerosis (ICD10-I70.0). Electronically Signed   By: Richarda Overlie M.D.   On: 06/13/2022 09:04   CT Angio Chest PE W and/or Wo Contrast  Result Date: 06/13/2022 CLINICAL DATA:  Pulmonary embolism suspected, high probability. Chest pain. EXAM: CT ANGIOGRAPHY CHEST WITH CONTRAST TECHNIQUE: Multidetector CT imaging of the chest was performed using the standard protocol during bolus administration of intravenous contrast. Multiplanar CT image reconstructions and MIPs were obtained to evaluate the vascular anatomy. RADIATION DOSE REDUCTION: This exam was performed according to the departmental dose-optimization program which includes automated exposure control, adjustment of the mA and/or kV according to patient size and/or use of iterative reconstruction technique. CONTRAST:  75mL OMNIPAQUE IOHEXOL 350 MG/ML SOLN COMPARISON:  07/14/2019 FINDINGS: Cardiovascular: Negative for pulmonary embolism. Heart size is prominent and left atrium appears to be enlarged measuring 5.0 cm in the AP dimension. No significant pericardial effusion. Small amount of calcium involving the LAD. Normal caliber of the thoracic aorta. Incidentally, the patient has a bovine type arch. Mediastinum/Nodes: Small hiatal hernia. No significant mediastinal or hilar lymph node enlargement. No axillary lymph node enlargement. Lungs/Pleura: Again noted is centrilobular emphysema. Chronic volume loss along the anterior aspect of the right minor fissure on image 73/6. Patchy dependent densities in both lungs particularly in the left lower lobe. Findings are suggestive  for atelectasis. No pleural effusions. Upper Abdomen: Question stranding near the pancreatic tail and splenic hilum but this area is incompletely imaged. Musculoskeletal: No acute bone abnormality. Again noted is elevation of the right hemidiaphragm. Review of the MIP images confirms the above findings. IMPRESSION: 1. Negative for pulmonary embolism. 2. Question stranding near the pancreatic tail and splenic hilum. This area is incompletely imaged. Recommend clinical correlation and consider further characterization with a dedicated CT of the abdomen and pelvis. 3. Patchy dependent densities in both lungs are most compatible with atelectasis. 4. Enlarged left atrium. Consider further characterization with echocardiogram. 5. Small hiatal hernia. 6. Emphysema (ICD10-J43.9). Electronically Signed   By: Richarda Overlie M.D.   On: 06/13/2022 07:31   DG Chest Portable 1 View  Result Date: 06/13/2022 CLINICAL DATA:  Chest pain. EXAM: PORTABLE CHEST 1 VIEW COMPARISON:  Portable chest 02/11/2019 FINDINGS: There is mild-to-moderate cardiomegaly. No vascular congestion is seen. No pleural collections. Mild chronic elevation right hemidiaphragm. The lungs are clear. Stable mediastinum with aortic tortuosity and atherosclerosis. There is overlying monitor wiring. No acute osseous findings. Osteopenia. Compare: Unchanged. IMPRESSION: No acute chest findings. Stable chest with cardiomegaly and aortic uncoiling and atherosclerosis. Electronically Signed   By: Almira Bar M.D.   On: 06/13/2022 04:35    Impression: Acute pancreatitis, lipase 3094, diffuse stranding around pancreas without pancreatic duct dilatation, cannot evaluate for necrosis as exam is without vascular contrast Distended gallbladder with layering stones, mild stranding and based upon black CBD 8 mm with concern for 2 small calculi in distal aspect of common bile duct suggestive of choledocholithiasis, small stone near cystic duct T. bili/ALT/ASTALP normal  at 0.07/01/20/39  Normal renal function, BUN/creatinine/GFR 15/0.9/160 Leukocytosis, 15.2, improving to 11.6, 11.20 Adequate hydration, hemoglobin 13 on presentation, 12.2/12.3 today   Plan: ERCP in a.m. with Dr. Ewing Schlein. Clear liquid diet today, n.p.o. postmidnight. Was on Eliquis, last dose 06/12/2022 at 11 PM. Will receive Lovenox at 10 PM today. Continue IV fluid resuscitation, lactated Ringer's at 150 mill per hour. Continue current pain management with Dilaudid 0.5 mg IV every  2 hours as needed. Will need cholecystectomy post ERCP, recommend surgical evaluation for the same.   LOS: 1 day   Kerin Salen, MD  06/14/2022, 12:18 PM

## 2022-06-15 ENCOUNTER — Encounter (HOSPITAL_COMMUNITY)
Admission: EM | Disposition: A | Discharge status: Home / Self Care | Payer: Self-pay | Source: Home / Self Care | Attending: Internal Medicine

## 2022-06-15 ENCOUNTER — Encounter (HOSPITAL_COMMUNITY): Payer: Self-pay | Admitting: General Surgery

## 2022-06-15 ENCOUNTER — Inpatient Hospital Stay (HOSPITAL_COMMUNITY): Payer: Medicare PPO | Admitting: Anesthesiology

## 2022-06-15 ENCOUNTER — Inpatient Hospital Stay (HOSPITAL_COMMUNITY): Payer: Medicare PPO

## 2022-06-15 DIAGNOSIS — E119 Type 2 diabetes mellitus without complications: Secondary | ICD-10-CM

## 2022-06-15 DIAGNOSIS — K851 Biliary acute pancreatitis without necrosis or infection: Secondary | ICD-10-CM

## 2022-06-15 DIAGNOSIS — I10 Essential (primary) hypertension: Secondary | ICD-10-CM

## 2022-06-15 DIAGNOSIS — R9431 Abnormal electrocardiogram [ECG] [EKG]: Secondary | ICD-10-CM | POA: Diagnosis not present

## 2022-06-15 DIAGNOSIS — K8050 Calculus of bile duct without cholangitis or cholecystitis without obstruction: Secondary | ICD-10-CM | POA: Diagnosis not present

## 2022-06-15 DIAGNOSIS — Z87891 Personal history of nicotine dependence: Secondary | ICD-10-CM

## 2022-06-15 DIAGNOSIS — J449 Chronic obstructive pulmonary disease, unspecified: Secondary | ICD-10-CM | POA: Diagnosis not present

## 2022-06-15 DIAGNOSIS — Z7984 Long term (current) use of oral hypoglycemic drugs: Secondary | ICD-10-CM

## 2022-06-15 HISTORY — PX: ENDOSCOPIC RETROGRADE CHOLANGIOPANCREATOGRAPHY (ERCP) WITH PROPOFOL: SHX5810

## 2022-06-15 HISTORY — PX: REMOVAL OF STONES: SHX5545

## 2022-06-15 HISTORY — PX: SPHINCTEROTOMY: SHX5544

## 2022-06-15 LAB — BRAIN NATRIURETIC PEPTIDE: B Natriuretic Peptide: 234.1 pg/mL — ABNORMAL HIGH (ref 0.0–100.0)

## 2022-06-15 LAB — COMPREHENSIVE METABOLIC PANEL
ALT: 19 U/L (ref 0–44)
AST: 18 U/L (ref 15–41)
Albumin: 3.2 g/dL — ABNORMAL LOW (ref 3.5–5.0)
Alkaline Phosphatase: 43 U/L (ref 38–126)
Anion gap: 7 (ref 5–15)
BUN: 14 mg/dL (ref 8–23)
CO2: 28 mmol/L (ref 22–32)
Calcium: 8.2 mg/dL — ABNORMAL LOW (ref 8.9–10.3)
Chloride: 102 mmol/L (ref 98–111)
Creatinine, Ser: 0.92 mg/dL (ref 0.44–1.00)
GFR, Estimated: >60 mL/min (ref >60)
Glucose, Bld: 102 mg/dL — ABNORMAL HIGH (ref 70–99)
Potassium: 4.3 mmol/L (ref 3.5–5.1)
Sodium: 137 mmol/L (ref 135–145)
Total Bilirubin: 0.8 mg/dL (ref 0.3–1.2)
Total Protein: 7.3 g/dL (ref 6.5–8.1)

## 2022-06-15 LAB — ECHOCARDIOGRAM COMPLETE
AR max vel: 1.68 cm2
AV Area VTI: 1.67 cm2
AV Area mean vel: 1.67 cm2
AV Mean grad: 9 mmHg
AV Peak grad: 15.2 mmHg
Ao pk vel: 1.95 m/s
Area-P 1/2: 3.45 cm2
Calc EF: 56.6 %
Height: 68 in
MV VTI: 1.86 cm2
S' Lateral: 3.6 cm
Single Plane A2C EF: 54.1 %
Single Plane A4C EF: 58.8 %
Weight: 3689.62 oz

## 2022-06-15 LAB — CBC WITH DIFFERENTIAL/PLATELET
Abs Immature Granulocytes: 0.05 10*3/uL (ref 0.00–0.07)
Basophils Absolute: 0.1 10*3/uL (ref 0.0–0.1)
Basophils Relative: 1 %
Eosinophils Absolute: 0.2 10*3/uL (ref 0.0–0.5)
Eosinophils Relative: 2 %
HCT: 40.6 % (ref 36.0–46.0)
Hemoglobin: 12.3 g/dL (ref 12.0–15.0)
Immature Granulocytes: 1 %
Lymphocytes Relative: 16 %
Lymphs Abs: 1.8 10*3/uL (ref 0.7–4.0)
MCH: 27.7 pg (ref 26.0–34.0)
MCHC: 30.3 g/dL (ref 30.0–36.0)
MCV: 91.4 fL (ref 80.0–100.0)
Monocytes Absolute: 1.4 10*3/uL — ABNORMAL HIGH (ref 0.1–1.0)
Monocytes Relative: 12 %
Neutro Abs: 7.7 10*3/uL (ref 1.7–7.7)
Neutrophils Relative %: 68 %
Platelets: 242 10*3/uL (ref 150–400)
RBC: 4.44 MIL/uL (ref 3.87–5.11)
RDW: 15.9 % — ABNORMAL HIGH (ref 11.5–15.5)
WBC: 11.1 10*3/uL — ABNORMAL HIGH (ref 4.0–10.5)
nRBC: 0 % (ref 0.0–0.2)

## 2022-06-15 LAB — PHOSPHORUS: Phosphorus: 3.8 mg/dL (ref 2.5–4.6)

## 2022-06-15 LAB — MAGNESIUM: Magnesium: 1.8 mg/dL (ref 1.7–2.4)

## 2022-06-15 LAB — LIPASE, BLOOD: Lipase: 47 U/L (ref 11–51)

## 2022-06-15 SURGERY — ENDOSCOPIC RETROGRADE CHOLANGIOPANCREATOGRAPHY (ERCP) WITH PROPOFOL
Anesthesia: General

## 2022-06-15 MED ORDER — FENTANYL CITRATE (PF) 100 MCG/2ML IJ SOLN
INTRAMUSCULAR | Status: AC
  Filled 2022-06-15: qty 2

## 2022-06-15 MED ORDER — ESMOLOL HCL 100 MG/10ML IV SOLN
INTRAVENOUS | Status: DC | PRN
  Administered 2022-06-15 (×2): 20 mg via INTRAVENOUS

## 2022-06-15 MED ORDER — LIDOCAINE 2% (20 MG/ML) 5 ML SYRINGE
INTRAMUSCULAR | Status: DC | PRN
  Administered 2022-06-15: 40 mg via INTRAVENOUS

## 2022-06-15 MED ORDER — CIPROFLOXACIN IN D5W 400 MG/200ML IV SOLN
INTRAVENOUS | Status: DC | PRN
  Administered 2022-06-15: 400 mg via INTRAVENOUS

## 2022-06-15 MED ORDER — SODIUM CHLORIDE 0.9 % IV SOLN
INTRAVENOUS | Status: DC | PRN
  Administered 2022-06-15: 25 mL

## 2022-06-15 MED ORDER — DICLOFENAC SUPPOSITORY 100 MG
RECTAL | Status: AC
  Filled 2022-06-15: qty 1

## 2022-06-15 MED ORDER — PROPOFOL 10 MG/ML IV BOLUS
INTRAVENOUS | Status: DC | PRN
  Administered 2022-06-15: 100 mg via INTRAVENOUS

## 2022-06-15 MED ORDER — PHENYLEPHRINE 80 MCG/ML (10ML) SYRINGE FOR IV PUSH (FOR BLOOD PRESSURE SUPPORT)
PREFILLED_SYRINGE | INTRAVENOUS | Status: DC | PRN
  Administered 2022-06-15: 160 ug via INTRAVENOUS
  Administered 2022-06-15: 80 ug via INTRAVENOUS

## 2022-06-15 MED ORDER — FENTANYL CITRATE (PF) 100 MCG/2ML IJ SOLN
INTRAMUSCULAR | Status: DC | PRN
  Administered 2022-06-15: 50 ug via INTRAVENOUS

## 2022-06-15 MED ORDER — SUGAMMADEX SODIUM 200 MG/2ML IV SOLN
INTRAVENOUS | Status: DC | PRN
  Administered 2022-06-15: 300 mg via INTRAVENOUS

## 2022-06-15 MED ORDER — CIPROFLOXACIN IN D5W 400 MG/200ML IV SOLN
INTRAVENOUS | Status: AC
  Filled 2022-06-15: qty 200

## 2022-06-15 MED ORDER — DICLOFENAC SUPPOSITORY 100 MG
RECTAL | Status: DC | PRN
  Administered 2022-06-15: 100 mg via RECTAL

## 2022-06-15 MED ORDER — ONDANSETRON HCL 4 MG/2ML IJ SOLN
INTRAMUSCULAR | Status: DC | PRN
  Administered 2022-06-15: 4 mg via INTRAVENOUS

## 2022-06-15 MED ORDER — METOPROLOL TARTRATE 25 MG PO TABS
12.5000 mg | ORAL_TABLET | Freq: Once | ORAL | Status: AC
  Administered 2022-06-15: 12.5 mg via ORAL
  Filled 2022-06-15: qty 1

## 2022-06-15 MED ORDER — GLUCAGON HCL RDNA (DIAGNOSTIC) 1 MG IJ SOLR
INTRAMUSCULAR | Status: AC
  Filled 2022-06-15: qty 1

## 2022-06-15 MED ORDER — METOPROLOL TARTRATE 25 MG PO TABS
25.0000 mg | ORAL_TABLET | Freq: Two times a day (BID) | ORAL | Status: DC
  Administered 2022-06-15 – 2022-06-21 (×12): 25 mg via ORAL
  Filled 2022-06-15 (×12): qty 1

## 2022-06-15 MED ORDER — ROCURONIUM BROMIDE 10 MG/ML (PF) SYRINGE
PREFILLED_SYRINGE | INTRAVENOUS | Status: DC | PRN
  Administered 2022-06-15: 50 mg via INTRAVENOUS

## 2022-06-15 MED ORDER — PROPOFOL 10 MG/ML IV BOLUS
INTRAVENOUS | Status: AC
  Filled 2022-06-15: qty 20

## 2022-06-15 MED ORDER — ACETAMINOPHEN 650 MG RE SUPP
650.0000 mg | Freq: Once | RECTAL | Status: AC | PRN
  Administered 2022-06-15: 650 mg via RECTAL
  Filled 2022-06-15: qty 1

## 2022-06-15 MED ORDER — DEXAMETHASONE SODIUM PHOSPHATE 10 MG/ML IJ SOLN
INTRAMUSCULAR | Status: DC | PRN
  Administered 2022-06-15: 10 mg via INTRAVENOUS

## 2022-06-15 NOTE — Anesthesia Preprocedure Evaluation (Addendum)
Anesthesia Evaluation  Patient identified by MRN, date of birth, ID band Patient awake    Reviewed: Allergy & Precautions, NPO status , Patient's Chart, lab work & pertinent test results  Airway Mallampati: III  TM Distance: >3 FB Neck ROM: Full    Dental no notable dental hx. (+) Teeth Intact, Dental Advisory Given   Pulmonary asthma , sleep apnea , COPD,  COPD inhaler, former smoker, PE   Pulmonary exam normal breath sounds clear to auscultation       Cardiovascular hypertension, Pt. on medications Normal cardiovascular exam Rhythm:Regular Rate:Normal  TTE 2021  1. Left ventricular ejection fraction, by estimation, is 65 to 70%. The  left ventricle has normal function. The left ventricle has no regional  wall motion abnormalities. There is mild left ventricular hypertrophy.  Left ventricular diastolic parameters  are consistent with Grade I diastolic dysfunction (impaired relaxation).   2. Right ventricular systolic function is normal. The right ventricular  size is normal. Tricuspid regurgitation signal is inadequate for assessing  PA pressure.   3. The mitral valve is grossly normal. No evidence of mitral valve  regurgitation.   4. The aortic valve is tricuspid. Aortic valve regurgitation is not  visualized.     Neuro/Psych  PSYCHIATRIC DISORDERS Anxiety     negative neurological ROS     GI/Hepatic Neg liver ROS,GERD  ,,  Endo/Other  diabetes, Type 2, Oral Hypoglycemic AgentsHypothyroidism    Renal/GU negative Renal ROS  negative genitourinary   Musculoskeletal negative musculoskeletal ROS (+)    Abdominal   Peds  Hematology  (+) Blood dyscrasia (eliquis)   Anesthesia Other Findings   Reproductive/Obstetrics                             Anesthesia Physical Anesthesia Plan  ASA: 3  Anesthesia Plan: General   Post-op Pain Management: Minimal or no pain anticipated   Induction:  Intravenous  PONV Risk Score and Plan: 3 and Dexamethasone, Ondansetron and Treatment may vary due to age or medical condition  Airway Management Planned: Oral ETT  Additional Equipment:   Intra-op Plan:   Post-operative Plan: Extubation in OR  Informed Consent: I have reviewed the patients History and Physical, chart, labs and discussed the procedure including the risks, benefits and alternatives for the proposed anesthesia with the patient or authorized representative who has indicated his/her understanding and acceptance.     Dental advisory given  Plan Discussed with: CRNA  Anesthesia Plan Comments:        Anesthesia Quick Evaluation

## 2022-06-15 NOTE — Anesthesia Postprocedure Evaluation (Signed)
Anesthesia Post Note  Patient: [name]  Procedure(s) Performed: ENDOSCOPIC RETROGRADE CHOLANGIOPANCREATOGRAPHY (ERCP) WITH PROPOFOL SPHINCTEROTOMY REMOVAL OF STONES     Patient location during evaluation: Endoscopy Anesthesia Type: General Level of consciousness: awake and alert Pain management: pain level controlled Vital Signs Assessment: post-procedure vital signs reviewed and stable Respiratory status: spontaneous breathing, nonlabored ventilation, respiratory function stable and patient connected to nasal cannula oxygen Cardiovascular status: blood pressure returned to baseline and stable Postop Assessment: no apparent nausea or vomiting Anesthetic complications: no  No notable events documented.  Last Vitals:  Vitals:   06/15/22 1400 06/15/22 1439  BP: (!) 163/71 (!) 162/71  Pulse: 71 66  Resp: 13 20  Temp:  36.6 C  SpO2: 93% 90%    Last Pain:  Vitals:   06/15/22 1439  TempSrc: Oral  PainSc:                  [name]

## 2022-06-15 NOTE — Transfer of Care (Signed)
Immediate Anesthesia Transfer of Care Note  Patient: [name]  Procedure(s) Performed: ENDOSCOPIC RETROGRADE CHOLANGIOPANCREATOGRAPHY (ERCP) WITH PROPOFOL SPHINCTEROTOMY REMOVAL OF STONES  Patient Location: PACU  Anesthesia Type:General  Level of Consciousness: awake, alert , and oriented  Airway & Oxygen Therapy: Patient Spontanous Breathing and Patient connected to face mask oxygen  Post-op Assessment: Report given to RN and Post -op Vital signs reviewed and stable  Post vital signs: Reviewed and stable  Last Vitals:  Vitals Value Taken Time  BP 181/67 06/15/22 1337  Temp    Pulse 72 06/15/22 1337  Resp 16 06/15/22 1337  SpO2 92 % 06/15/22 1337    Last Pain:  Vitals:   06/15/22 1149  TempSrc: Tympanic  PainSc:       Patients Stated Pain Goal: 2 (06/15/22 0409)  Complications: No notable events documented.

## 2022-06-15 NOTE — Consult Note (Signed)
[name] [facility] 02-23-1940  [id number].    Requesting MD: Dr. Lacretia Nicks Chief Complaint/Reason for Consult: gallstone pancreatitis  HPI:  This is an 82 yo female with a history of COPD, h/o PE on Eliquis (LD Friday), GERD, hypothyroidism, HTN, HLD, who woke up Friday morning with severe pain in her chest and epigastrium.  She then had some nausea and vomiting.  Her symptoms persisted and so she went to the ED where she was found to have a lipase >3000, normal LFTs, and WBC initially of 15K, but now down to 11K.  She underwent a CT scan of her A/P that revealed choledocholithiasis and some stranding about her pancreas c/w pancreatitis.  She has been admitted and her Eliquis held.  She is scheduled today for an ERCP.  We have been asked to see her for lap chole to follow ERCP.   She denies fever, chills, diarrhea, melena, or urinary sxs. Denies wearing home O2. Past abdominal surgeries: hysterectomy Denies a cardiac history. Denies history of CVA.  ROS: ROS: see HPI  Family History  Problem Relation Age of Onset   COPD Mother        Husband Smoked    Lymphoma Father    Hypertension Sister    Scoliosis Daughter    Stroke Maternal Grandfather    Rheumatic fever Paternal Grandmother    Heart disease Paternal Grandfather     Past Medical History:  Diagnosis Date   Actinic keratosis    Arthritis    Asthma    Back injury 2019   Fracture   Basal cell carcinoma    COPD (chronic obstructive pulmonary disease) (HCC)    GERD (gastroesophageal reflux disease)    H/O blood clots 2020   Lung, Per [facility] New Patient Packet   H/O mammogram 2022   Per [facility] New Patient Packet   Heart murmur    hx of in childhood    High cholesterol    Hyperlipidemia    Hypertension    Hypothyroidism    Kidney disease    Per [facility] New Patient Packet   MCNS (minimal change nephrotic syndrome)    OSA (obstructive sleep apnea) 08/29/2019   Osteopenia    Shortness of breath dyspnea    on  exertion    Squamous cell carcinoma    Thyroid disease    Per [facility] New Patient Packet    Past Surgical History:  Procedure Laterality Date   ABDOMINAL HYSTERECTOMY     APPENDECTOMY     BILATERAL SALPINGOOPHORECTOMY     Ovarian Cysts    COLONOSCOPY  2001   Dr.Mann, Per [facility] New Patient Packet   CONVERSION TO TOTAL KNEE Right 07/10/2014   Procedure: RIGHT CONVERSION OF PARTIAL KNEE TO TOTAL KNEE;  Surgeon: [name], MD;  Location: [facility] ORS;  Service: Orthopedics;  Laterality: Right;   IR ANGIOGRAM PULMONARY BILATERAL SELECTIVE  02/08/2019   IR ANGIOGRAM SELECTIVE EACH ADDITIONAL VESSEL  02/08/2019   IR ANGIOGRAM SELECTIVE EACH ADDITIONAL VESSEL  02/08/2019   IR INFUSION THROMBOL ARTERIAL INITIAL (MS)  02/08/2019   IR INFUSION THROMBOL ARTERIAL INITIAL (MS)  02/08/2019   IR THROMB F/U EVAL ART/VEN FINAL DAY (MS)  02/09/2019   IR US GUIDE VASC ACCESS RIGHT  02/08/2019   MEDIAL PARTIAL KNEE REPLACEMENT Bilateral    MOHS SURGERY     x 2   RENAL BIOPSY     x 2   REPLACEMENT TOTAL KNEE  2012   Per [facility] New  Patient Packet   ROTATOR CUFF REPAIR Left 2008    Social History:  reports that she quit smoking about 49 years ago. Her smoking use included cigarettes. She has a 37.50 pack-year smoking history. She has never used smokeless tobacco. She reports current alcohol use of about 0.5 standard drinks of alcohol per week. She reports that she does not use drugs.  Allergies: No Known Allergies  Medications Prior to Admission  Medication Sig Dispense Refill   albuterol (VENTOLIN HFA) 108 (90 Base) MCG/ACT inhaler Inhale 1 puff into the lungs as needed for wheezing or shortness of breath.     Calcium Carb-Cholecalciferol (CALCIUM-VITAMIN D) 600-400 MG-UNIT TABS Take 1 tablet by mouth daily.     citalopram (CELEXA) 20 MG tablet TAKE 1 TABLET BY MOUTH EVERYDAY AT BEDTIME 90 tablet 1   donepezil (ARICEPT) 10 MG tablet TAKE 1 TABLET BY MOUTH EVERYDAY AT BEDTIME 90 tablet 1   ELIQUIS 5 MG TABS  tablet TAKE 1 TABLET BY MOUTH TWICE A DAY 60 tablet 3   ferrous sulfate 325 (65 FE) MG EC tablet Take 1 tablet (325 mg total) by mouth daily with breakfast. 90 tablet 1   fluticasone (FLONASE) 50 MCG/ACT nasal spray INSTILL 1 SPRAY INTO BOTH NOSTRILS DAILY 48 mL 2   Fluticasone-Umeclidin-Vilant (TRELEGY ELLIPTA) 100-62.5-25 MCG/ACT AEPB TAKE 1 PUFF BY MOUTH EVERY DAY 60 each 11   guaiFENesin (MUCINEX) 600 MG 12 hr tablet Take 1,200 mg by mouth daily.     ipratropium (ATROVENT) 0.03 % nasal spray Place 2 sprays into both nostrils 2 (two) times daily.     levothyroxine (SYNTHROID) 88 MCG tablet TAKE 1 TABLET BY MOUTH EVERY DAY IN THE MORNING 90 tablet 3   losartan (COZAAR) 100 MG tablet TAKE 1 TABLET BY MOUTH EVERY DAY 90 tablet 1   metFORMIN (GLUCOPHAGE) 500 MG tablet Take 1 tablet (500 mg total) by mouth daily with supper. 180 tablet 3   montelukast (SINGULAIR) 10 MG tablet TAKE 1 TABLET BY MOUTH EVERYDAY AT BEDTIME 90 tablet 1   Multiple Vitamin (MULTIVITAMIN WITH MINERALS) TABS tablet Take 1 tablet by mouth daily.     pantoprazole (PROTONIX) 40 MG tablet Take 1 tablet (40 mg total) by mouth daily. 30 tablet 3   rOPINIRole (REQUIP) 1 MG tablet TAKE 1 TABLET BY MOUTH EVERYDAY AT BEDTIME 90 tablet 1   rosuvastatin (CRESTOR) 10 MG tablet Take 2 tablets (20 mg total) by mouth daily. E78.2 180 tablet 3   tacrolimus (PROGRAF) 1 MG capsule Take 1 mg by mouth 2 (two) times daily.      Azelastine HCl (ASTEPRO) 0.15 % SOLN Place 1 spray into the nose every morning. (Patient not taking: Reported on 06/13/2022) 30 mL 5     Physical Exam: Blood pressure (!) 187/110, pulse 91, temperature 99.3 F (37.4 C), temperature source Oral, resp. rate 20, height 5\' 8"  (1.727 m), weight 104.6 kg, SpO2 96 %.  General: pleasant, WD, WN female who is laying in bed in NAD HEENT: head is normocephalic, atraumatic.  Sclera are noninjected.  PERRL.  Ears and nose without any masses or lesions.  Mouth is pink and moist Heart:  regular, rate, and rhythm.  Normal s1,s2. No obvious murmurs, gallops, or rubs noted.  Palpable radial and pedal pulses bilaterally Lungs: CTAB, no wheezes, rhonchi, or rales noted.  Respiratory effort nonlabored Abd: soft, mild epigastric and ruq tenderness without guarding, ND, +BS, no masses, hernias, or organomegaly MS: all 4 extremities are symmetrical with no  cyanosis, clubbing, or edema. Skin: warm and dry with no masses, lesions, or rashes Neuro: Cranial nerves 2-12 grossly intact, sensation is normal throughout Psych: A&Ox3 with an appropriate affect.   Results for orders placed or performed during the hospital encounter of 06/13/22 (from the past 48 hour(s))  CBC     Status: Abnormal   Collection Time: 06/13/22  6:57 PM  Result Value Ref Range   WBC 11.6 (H) 4.0 - 10.5 K/uL   RBC 4.45 3.87 - 5.11 MIL/uL   Hemoglobin 12.2 12.0 - 15.0 g/dL   HCT 27.0 35.0 - 09.3 %   MCV 90.3 80.0 - 100.0 fL   MCH 27.4 26.0 - 34.0 pg   MCHC 30.3 30.0 - 36.0 g/dL   RDW 81.8 (H) 29.9 - 37.1 %   Platelets 262 150 - 400 K/uL   nRBC 0.0 0.0 - 0.2 %    Comment: Performed at [facility], [address] [place], [place] [address]  Creatinine, serum     Status: None   Collection Time: 06/13/22  6:57 PM  Result Value Ref Range   Creatinine, Ser 0.90 0.44 - 1.00 mg/dL   GFR, Estimated >93 >81 mL/min    Comment: (NOTE) Calculated using the CKD-EPI Creatinine Equation (2021) Performed at [facility], [address] [place], [place] [address]   CBC     Status: Abnormal   Collection Time: 06/14/22  4:17 AM  Result Value Ref Range   WBC 11.1 (H) 4.0 - 10.5 K/uL   RBC 4.48 3.87 - 5.11 MIL/uL   Hemoglobin 12.3 12.0 - 15.0 g/dL   HCT 02.5 85.2 - 77.8 %   MCV 91.5 80.0 - 100.0 fL   MCH 27.5 26.0 - 34.0 pg   MCHC 30.0 30.0 - 36.0 g/dL   RDW 24.2 (H) 35.3 - 61.4 %   Platelets 269 150 - 400 K/uL   nRBC 0.0 0.0 - 0.2 %    Comment: Performed at [facility], [address], [place], [place] [address]  Comprehensive metabolic panel     Status: Abnormal   Collection Time: 06/14/22  4:17 AM  Result Value Ref Range   Sodium 140 135 - 145 mmol/L   Potassium 4.4 3.5 - 5.1 mmol/L   Chloride 104 98 - 111 mmol/L   CO2 29 22 - 32 mmol/L   Glucose, Bld 127 (H) 70 - 99 mg/dL    Comment: Glucose reference range applies only to samples taken after fasting for at least 8 hours.   BUN 15 8 - 23 mg/dL   Creatinine, Ser 0.08 0.44 - 1.00 mg/dL   Calcium 8.0 (L) 8.9 - 10.3 mg/dL   Total Protein 6.9 6.5 - 8.1 g/dL   Albumin 3.2 (L) 3.5 - 5.0 g/dL   AST 23 15 - 41 U/L   ALT 22 0 - 44 U/L   Alkaline Phosphatase 39 38 - 126 U/L   Total Bilirubin 0.5 0.3 - 1.2 mg/dL   GFR, Estimated >67 >61 mL/min    Comment: (NOTE) Calculated using the CKD-EPI Creatinine Equation (2021)    Anion gap 7 5 - 15    Comment: Performed at [facility], [address] [place], [place] [address]  CBC with Differential/Platelet     Status: Abnormal   Collection Time: 06/15/22  4:00 AM  Result Value Ref Range   WBC 11.1 (H) 4.0 - 10.5 K/uL   RBC 4.44 3.87 - 5.11  MIL/uL   Hemoglobin 12.3 12.0 - 15.0 g/dL   HCT 40.9 81.1 - 91.4 %   MCV 91.4 80.0 - 100.0 fL   MCH 27.7 26.0 - 34.0 pg   MCHC 30.3 30.0 - 36.0 g/dL   RDW 78.2 (H) 95.6 - 21.3 %   Platelets 242 150 - 400 K/uL   nRBC 0.0 0.0 - 0.2 %   Neutrophils Relative % 68 %   Neutro Abs 7.7 1.7 - 7.7 K/uL   Lymphocytes Relative 16 %   Lymphs Abs 1.8 0.7 - 4.0 K/uL   Monocytes Relative 12 %   Monocytes Absolute 1.4 (H) 0.1 - 1.0 K/uL   Eosinophils Relative 2 %   Eosinophils Absolute 0.2 0.0 - 0.5 K/uL   Basophils Relative 1 %   Basophils Absolute 0.1 0.0 - 0.1 K/uL   Immature Granulocytes 1 %   Abs Immature Granulocytes 0.05 0.00 - 0.07 K/uL    Comment: Performed at [facility] [address] [place], [place] [address]  Comprehensive metabolic panel     Status:  Abnormal   Collection Time: 06/15/22  4:00 AM  Result Value Ref Range   Sodium 137 135 - 145 mmol/L   Potassium 4.3 3.5 - 5.1 mmol/L   Chloride 102 98 - 111 mmol/L   CO2 28 22 - 32 mmol/L   Glucose, Bld 102 (H) 70 - 99 mg/dL    Comment: Glucose reference range applies only to samples taken after fasting for at least 8 hours.   BUN 14 8 - 23 mg/dL   Creatinine, Ser 8.46 0.44 - 1.00 mg/dL   Calcium 8.2 (L) 8.9 - 10.3 mg/dL   Total Protein 7.3 6.5 - 8.1 g/dL   Albumin 3.2 (L) 3.5 - 5.0 g/dL   AST 18 15 - 41 U/L   ALT 19 0 - 44 U/L   Alkaline Phosphatase 43 38 - 126 U/L   Total Bilirubin 0.8 0.3 - 1.2 mg/dL   GFR, Estimated >96 >29 mL/min    Comment: (NOTE) Calculated using the CKD-EPI Creatinine Equation (2021)    Anion gap 7 5 - 15    Comment: Performed at [facility], [address] [place], [place] [address]  Magnesium     Status: None   Collection Time: 06/15/22  4:00 AM  Result Value Ref Range   Magnesium 1.8 1.7 - 2.4 mg/dL    Comment: Performed at [facility], [address], [place], [place] [address]  Phosphorus     Status: None   Collection Time: 06/15/22  4:00 AM  Result Value Ref Range   Phosphorus 3.8 2.5 - 4.6 mg/dL    Comment: Performed at [facility], [address] [place], [place] [address]  Lipase, blood     Status: None   Collection Time: 06/15/22 10:15 AM  Result Value Ref Range   Lipase 47 11 - 51 U/L    Comment: Performed at [facility], [address] [place], [place] [address]   No results found.    Assessment/Plan Gallstone pancreatitis with choledocholithiasis The patient has been seen, examined, labs, vitals, chart, and imaging reviewed.  She appears to have choledocholithiasis with associated gallstone pancreatitis.  Her labs are all improving.  Her Eliquis has been held and she is scheduled for ERCP today.  If all goes well with this, then we can plan for lap  chole this admission.     FEN - NPO  for ERCP, NPO p MN for possible OR tomorrow pending scheduling and no complications VTE - Eliquis on hold, Lovenox ID - none currently  COPD HTN HLD GERD Hypothyroidism H/O PE - LD Eliquis Friday  I reviewed hospitalist notes, last 24 h vitals and pain scores, last 48 h intake and output, last 24 h labs and trends, and last 24 h imaging results.  Hosie Spangle, [facility] 06/15/2022, 11:05 AM Please see Amion for pager number during day hours 7:00am-4:30pm or 7:00am -11:30am on weekends

## 2022-06-15 NOTE — Progress Notes (Signed)
[name] 11:56 AM  Subjective: Patient seen and examined and case discussed with my partner Dr. Marca Ancona and her hospital computer chart reviewed she is doing better than she was on admission and we rediscussed the procedure he has no new complaints  Objective: Vital signs stable afebrile no acute distress exam please see preassessment evaluation labs reviewed liver tests okay lipase okay white count minimally elevated CT reviewed  Assessment: Gallstone pancreatitis with probable CBD stone on the CT scan  Plan: Okay to proceed with ERCP with anesthesia assistance and the risk benefits methods and success rate were discussed with the patient  [facility]  office [phone] After 5PM or if no answer call [phone]

## 2022-06-15 NOTE — Op Note (Signed)
[facility] Patient Name: [name] Procedure Date: 06/15/2022 MRN: [id number] Attending MD: Vida Rigger , MD, [id number] Date of Birth: January 04, 1941 CSN: [id number] Age: 82 Admit Type: Inpatient Procedure:                ERCP Indications:              Bile duct stone(s) on CT scan and patient with                            gallstone pancreatitis Providers:                Vida Rigger, MD, Margaree Mackintosh, RN, Stephens Shire RN, RN, Irene Shipper, Technician, Rozetta Nunnery, Technician, Mirian Mo, CRNA Referring MD:              Medicines:                General Anesthesia Complications:            No immediate complications. Estimated Blood Loss:     Estimated blood loss: none. Procedure:                Pre-Anesthesia Assessment:                           - Prior to the procedure, a History and Physical                            was performed, and patient medications and                            allergies were reviewed. The patient's tolerance of                            previous anesthesia was also reviewed. The risks                            and benefits of the procedure and the sedation                            options and risks were discussed with the patient.                            All questions were answered, and informed consent                            was obtained. Prior Anticoagulants: The patient has                            taken Eliquis (apixaban), last dose was 3 days  prior to procedure. ASA Grade Assessment: III - A                            patient with severe systemic disease. After                            reviewing the risks and benefits, the patient was                            deemed in satisfactory condition to undergo the                            procedure.                           After obtaining informed consent, the scope was                             passed under direct vision. Throughout the                            procedure, the patient's blood pressure, pulse, and                            oxygen saturations were monitored continuously. The                            [id number]-[id number] ([id number] Olympus duodenoscope was                            introduced through the mouth, and used to inject                            contrast into and used to cannulate the bile duct.                            The ERCP was accomplished without difficulty. The                            patient tolerated the procedure well. Scope In: Scope Out: Findings:      The major papilla was normal. Deep selective cannulation was readily       obtained and we proceeded with a biliary sphincterotomy was made with a       Hydratome sphincterotome using ERBE electrocautery. There was no       post-sphincterotomy bleeding. We proceeded with the sphincterotomy until       we had adequate biliary drainage and could get the fully bowed       sphincterotome easily in and out of the duct and to discover objects,       the biliary tree was swept with a 12 mm balloon starting at the       bifurcation. Sludge was swept from the duct. All stones were removed.       Nothing was found on occlusion cholangiogram at the end of the procedure  and there was sluggish but adequate biliary drainage and there was no       pancreatic duct injection or wire advancement throughout the procedure       the wire and the balloon were removed and the scope was removed and the       patient tolerated the procedure well. Impression:               - The major papilla appeared normal.                           - Choledocholithiasis was found. Complete removal                            was accomplished by biliary sphincterotomy and                            balloon extraction.                           - A biliary sphincterotomy was performed.                            - The biliary tree was swept and nothing was found. Moderate Sedation:      Not Applicable - Patient had care per Anesthesia. Recommendation:           - Clear liquid diet today. N.p.o. after midnight                            for possible lap chole tomorrow                           - Continue present medications.                           - Return to GI clinic PRN.                           - Telephone GI clinic if symptomatic PRN. Procedure Code(s):        --- Professional ---                           [phone], Endoscopic retrograde                            cholangiopancreatography (ERCP); with removal of                            calculi/debris from biliary/pancreatic duct(s)                           43262, Endoscopic retrograde                            cholangiopancreatography (ERCP); with                            sphincterotomy/papillotomy Diagnosis Code(s):        ---  Professional ---                           K80.50, Calculus of bile duct without cholangitis                            or cholecystitis without obstruction CPT copyright 2022 [employer]. All rights reserved. The codes documented in this report are preliminary and upon coder review may  be revised to meet current compliance requirements. Vida Rigger, MD 06/15/2022 1:23:42 PM This report has been signed electronically. Number of Addenda: 0

## 2022-06-15 NOTE — Progress Notes (Signed)
Contacted Dr Armond Hang regarding pt's runs of tachycardia.  Dr Armond Hang is aware and ok for pt to return to unit with a stable blood pressure.

## 2022-06-15 NOTE — Progress Notes (Signed)
PROGRESS NOTE    [name]  EXB:[id number] DOB: Apr 19, 1940 DOA: 06/13/2022 PCP: [name], NP  Chief Complaint  Patient presents with   Chest Pain    Brief Narrative:   [name] is [name] 82 y.o. female with medical history significant of COPD, GERD, hypothyroidism, essential hypertension, hyperlipidemia, hyperlipidemia, asthma, history of pulmonary embolism on Eliquis who presented to med [facility] with chest and epigastric pain.  Found to have pancreatitis.  Assessment & Plan:   Principal Problem:   Acute pancreatitis Active Problems:   Hypothyroidism   Essential hypertension, benign   Mixed hyperlipidemia   COPD (chronic obstructive pulmonary disease) (HCC)   GERD without esophagitis   Pulmonary embolism (HCC)   OSA (obstructive sleep apnea)  Gallstone Pancreatitis  Choledocholithiasis  Cholelithiasis Lipased >3000, CT with acute pancreatitis, small calculi in region of distal common bile duct.  Cholelithiasis and probable choledocholithiasis.  Normal LFT's. Some meds have association with pancreatitis, but with chololithiasis and choledocholithiasis -> more likely cause Pain seems to be improving Follow with clears GI consult, appreciate recs -> s/p ERCP, s/p biliary sphincterotomy and balloon extraction  Surgery c/s, appreciate recs  -> possible OR tmrw RCRI 1 for intraabdominal surgery, echo as noted below.  Likely no benefit to additional preop testing.  COPD/age most notable risk factors.  Atrial Tachycardia Tele with runs of atrial tachy and sinus rhythm  Still tachy to 130s at times Uptitrate metop as tolerated Echo with EF 50-55%, no RWMA, grade 1 diastolic dysfunction, mild dilation of LA and RA, IVC dilated with < 50% resp variability I think cardiac monitoring after discharge may be warranted with cards follow up Continue tele  Hypertension Follow BP on losartan and metoprolol  Still higher than we'd like, follow  with metop adjustments  Minimal Change Disease  CKD  Tacrolimus  COPD Currently on 2 L, will work on weaning this Continue controller med, prn rescue inhaler  Hx PE Eliquis on hold with need for possible procedure - ok to hold, 1x PE, ~4 years ago per her report - lovenox for now  Hypothyroidism Synthroid  Dyslipidemia statin    DVT prophylaxis: lovenxo Code Status: full Family Communication: none Disposition:   Status is: Inpatient Remains inpatient appropriate because: need for GI c/s, continued workup    Consultants:  GI  Procedures:  Echo IMPRESSIONS     1. Left ventricular ejection fraction, by estimation, is 50 to 55%. The  left ventricle has low normal function. The left ventricle has no regional  wall motion abnormalities. There is mild concentric left ventricular  hypertrophy. Left ventricular  diastolic parameters are consistent with Grade I diastolic dysfunction  (impaired relaxation).   2. Right ventricular systolic function is normal. The right ventricular  size is normal. Tricuspid regurgitation signal is inadequate for assessing  PA pressure.   3. Left atrial size was mildly dilated.   4. Right atrial size was mildly dilated.   5. The mitral valve is normal in structure. Trivial mitral valve  regurgitation. No evidence of mitral stenosis.   6. The aortic valve is tricuspid. There is mild calcification of the  aortic valve. Aortic valve regurgitation is not visualized. Mild aortic  valve stenosis. Aortic valve area, by VTI measures 1.67 cm. Aortic valve  mean gradient measures 9.0 mmHg.   7. The inferior vena cava is dilated in size with <50% respiratory  variability, suggesting right atrial pressure of 15 mmHg.   ERCP - The major  papilla appeared normal. - Choledocholithiasis was found. Complete removal was accomplished by biliary sphincterotomy and balloon extraction. - [name] biliary sphincterotomy was performed. - The biliary tree was swept and  nothing was found. - Clear liquid diet today. N. p. o. after midnight for possible lap chole tomorrow - Continue present medications.  Antimicrobials:  Anti-infectives (From admission, onward)    None       Subjective: Continued abdominal pain, overall improved Daughter at bedside  Objective: Vitals:   06/15/22 1340 06/15/22 1350 06/15/22 1400 06/15/22 1439  BP: (!) 171/68 (!) 165/68 (!) 163/71 (!) 162/71  Pulse: 71 74 71 66  Resp: 13 13 13 20   Temp:    97.9 F (36.6 C)  TempSrc:    Oral  SpO2: 94% 91% 93% 90%  Weight:      Height:        Intake/Output Summary (Last 24 hours) at 06/15/2022 1618 Last data filed at 06/15/2022 0742 Gross per 24 hour  Intake 3037.64 ml  Output 20 ml  Net 3017.64 ml   Filed Weights   06/14/22 0229  Weight: 104.6 kg    Examination:  General: No acute distress. Cardiovascular: runs of atrial tach on tele, RRR Lungs: Clear to auscultation bilaterally, no wheezing, currently on 2 L [place] Abdomen: Soft, nontender, nondistended Neurological: Alert and oriented 3. Moves all extremities 4 with equal strength. Cranial nerves II through XII grossly intact. Extremities: No clubbing or cyanosis. No edema.    Data Reviewed: I have personally reviewed following labs and imaging studies  CBC: Recent Labs  Lab 06/13/22 0504 06/13/22 1857 06/14/22 0417 06/15/22 0400  WBC 15.3* 11.6* 11.1* 11.1*  NEUTROABS 12.1*  --   --  7.7  HGB 13.0 12.2 12.3 12.3  HCT 40.7 40.2 41.0 40.6  MCV 86.4 90.3 91.5 91.4  PLT 296 262 269 242    Basic Metabolic Panel: Recent Labs  Lab 06/13/22 0409 06/13/22 0800 06/13/22 1857 06/14/22 0417 06/15/22 0400  NA 141  --   --  140 137  K 3.6  --   --  4.4 4.3  CL 103  --   --  104 102  CO2 28  --   --  29 28  GLUCOSE 151*  --   --  127* 102*  BUN 20  --   --  15 14  CREATININE 1.08*  --  0.90 0.90 0.92  CALCIUM 8.8*  --   --  8.0* 8.2*  MG  --  1.6*  --   --  1.8  PHOS  --  3.6  --   --  3.8     GFR: Estimated Creatinine Clearance: 59.7 mL/min (by C-G formula based on SCr of 0.92 mg/dL).  Liver Function Tests: Recent Labs  Lab 06/13/22 0800 06/14/22 0417 06/15/22 0400  AST 42* 23 18  ALT 23 22 19   ALKPHOS 38 39 43  BILITOT 0.5 0.5 0.8  PROT 6.5 6.9 7.3  ALBUMIN 3.4* 3.2* 3.2*    CBG: No results for input(s): "GLUCAP" in the last 168 hours.   No results found for this or any previous visit (from the past 240 hour(s)).       Radiology Studies: ECHOCARDIOGRAM COMPLETE  Result Date: 06/15/2022    ECHOCARDIOGRAM REPORT   Patient Name:   [name] Date of Exam: 06/15/2022 Medical Rec #:  [id number]              Height:  68.0 in Accession #:    [id number]             Weight:       230.6 lb Date of Birth:  January 04, 1941              BSA:          2.171 m Patient Age:    81 years               BP:           162/71 mmHg Patient Gender: F                      HR:           62 bpm. Exam Location:  Inpatient Procedure: 2D Echo, Cardiac Doppler and Color Doppler Indications:    Abnormal EKG  History:        Patient has prior history of Echocardiogram examinations, most                 recent 04/11/2019. COPD and Acute Pancreatitis; Risk                 Factors:Hypertension, Dyslipidemia, Sleep Apnea and Diabetes.  Sonographer:    NA Referring Phys: [id number] Dariella Gillihan CALDWELL POWELL JR IMPRESSIONS  1. Left ventricular ejection fraction, by estimation, is 50 to 55%. The left ventricle has low normal function. The left ventricle has no regional wall motion abnormalities. There is mild concentric left ventricular hypertrophy. Left ventricular diastolic parameters are consistent with Grade I diastolic dysfunction (impaired relaxation).  2. Right ventricular systolic function is normal. The right ventricular size is normal. Tricuspid regurgitation signal is inadequate for assessing PA pressure.  3. Left atrial size was mildly dilated.  4. Right atrial size was mildly dilated.  5. The mitral  valve is normal in structure. Trivial mitral valve regurgitation. No evidence of mitral stenosis.  6. The aortic valve is tricuspid. There is mild calcification of the aortic valve. Aortic valve regurgitation is not visualized. Mild aortic valve stenosis. Aortic valve area, by VTI measures 1.67 cm. Aortic valve mean gradient measures 9.0 mmHg.  7. The inferior vena cava is dilated in size with <50% respiratory variability, suggesting right atrial pressure of 15 mmHg. FINDINGS  Left Ventricle: Left ventricular ejection fraction, by estimation, is 50 to 55%. The left ventricle has low normal function. The left ventricle has no regional wall motion abnormalities. The left ventricular internal cavity size was normal in size. There is mild concentric left ventricular hypertrophy. Left ventricular diastolic parameters are consistent with Grade I diastolic dysfunction (impaired relaxation). Right Ventricle: The right ventricular size is normal. No increase in right ventricular wall thickness. Right ventricular systolic function is normal. Tricuspid regurgitation signal is inadequate for assessing PA pressure. Left Atrium: Left atrial size was mildly dilated. Right Atrium: Right atrial size was mildly dilated. Pericardium: There is no evidence of pericardial effusion. Mitral Valve: The mitral valve is normal in structure. There is mild calcification of the mitral valve leaflet(s). Mild mitral annular calcification. Trivial mitral valve regurgitation. No evidence of mitral valve stenosis. MV peak gradient, 5.7 mmHg. The mean mitral valve gradient is 3.0 mmHg. Tricuspid Valve: The tricuspid valve is normal in structure. Tricuspid valve regurgitation is not demonstrated. Aortic Valve: The aortic valve is tricuspid. There is mild calcification of the aortic valve. Aortic valve regurgitation is not visualized. Mild aortic stenosis is present. Aortic valve mean gradient measures 9.0 mmHg. Aortic valve  peak gradient measures   15.2 mmHg. Aortic valve area, by VTI measures 1.67 cm. Pulmonic Valve: The pulmonic valve was normal in structure. Pulmonic valve regurgitation is not visualized. Aorta: The aortic root is normal in size and structure. Venous: The inferior vena cava is dilated in size with less than 50% respiratory variability, suggesting right atrial pressure of 15 mmHg. IAS/Shunts: No atrial level shunt detected by color flow Doppler.  LEFT VENTRICLE PLAX 2D LVIDd:         5.20 cm     Diastology LVIDs:         3.60 cm     LV e' medial:    8.05 cm/s LV PW:         1.20 cm     LV E/e' medial:  12.3 LV IVS:        1.20 cm     LV e' lateral:   8.59 cm/s LVOT diam:     1.90 cm     LV E/e' lateral: 11.6 LV SV:         79 LV SV Index:   36 LVOT Area:     2.84 cm  LV Volumes (MOD) LV vol d, MOD A2C: 91.6 ml LV vol d, MOD A4C: 91.5 ml LV vol s, MOD A2C: 42.0 ml LV vol s, MOD A4C: 37.7 ml LV SV MOD A2C:     49.6 ml LV SV MOD A4C:     91.5 ml LV SV MOD BP:      52.5 ml RIGHT VENTRICLE             IVC RV Basal diam:  3.60 cm     IVC diam: 2.70 cm RV S prime:     15.00 cm/s TAPSE (M-mode): 3.0 cm LEFT ATRIUM             Index        RIGHT ATRIUM           Index LA diam:        3.80 cm 1.75 cm/m   RA Area:     21.30 cm LA Vol (A2C):   64.2 ml 29.57 ml/m  RA Volume:   61.70 ml  28.42 ml/m LA Vol (A4C):   69.3 ml 31.92 ml/m LA Biplane Vol: 68.2 ml 31.41 ml/m  AORTIC VALVE AV Area (Vmax):    1.68 cm AV Area (Vmean):   1.67 cm AV Area (VTI):     1.67 cm AV Vmax:           195.00 cm/s AV Vmean:          140.000 cm/s AV VTI:            0.471 m AV Peak Grad:      15.2 mmHg AV Mean Grad:      9.0 mmHg LVOT Vmax:         115.50 cm/s LVOT Vmean:        82.400 cm/s LVOT VTI:          0.277 m LVOT/AV VTI ratio: 0.59  AORTA Ao Root diam: 2.80 cm Ao Asc diam:  3.40 cm MITRAL VALVE MV Area (PHT): 3.45 cm     SHUNTS MV Area VTI:   1.86 cm     Systemic VTI:  0.28 m MV Peak grad:  5.7 mmHg     Systemic Diam: 1.90 cm MV Mean grad:  3.0 mmHg MV Vmax:        1.19 m/s MV  Vmean:      72.8 cm/s MV Decel Time: 220 msec MV E velocity: 99.30 cm/s MV [name] velocity: 115.00 cm/s MV E/[name] ratio:  0.86 Dalton McleanMD Electronically signed by Wilfred Lacy Signature Date/Time: 06/15/2022/4:03:24 PM    Final    DG ERCP  Result Date: 06/15/2022 CLINICAL DATA:  Pancreatitis.  Concern for choledocholithiasis. EXAM: ERCP TECHNIQUE: Multiple spot images obtained with the fluoroscopic device and submitted for interpretation post-procedure. FLUOROSCOPY TIME: FLUOROSCOPY TIME 2.2 mGy COMPARISON:  CT abdomen and pelvis 06/13/2022 FINDINGS: Five spot intraoperative fluoroscopic images of the right upper abdominal quadrant during ERCP are provided for review Initial image demonstrates an ERCP probe overlying the right upper abdominal quadrant. There is selective cannulation and faint opacification of the common bile duct which appears nondilated Subsequent images demonstrate insufflation of [name] balloon within the central aspect of the CBD with subsequent biliary sweeping and presumed sphincterotomy. There is faint opacification of the cystic duct. There is minimal opacification of the intrahepatic biliary tree which appears nondilated. There is no definitive opacification of the pancreatic duct. IMPRESSION: ERCP with biliary sweeping and presumed sphincterotomy as above. These images were submitted for radiologic interpretation only. Please see the procedural report for the amount of contrast and the fluoroscopy time utilized. Electronically Signed   By: Simonne Come M.D.   On: 06/15/2022 15:36        Scheduled Meds:  citalopram  20 mg Oral Daily   donepezil  10 mg Oral QHS   enoxaparin (LOVENOX) injection  40 mg Subcutaneous Q24H   fluticasone furoate-vilanterol  1 puff Inhalation Daily   And   umeclidinium bromide  1 puff Inhalation Daily   ipratropium  2 spray Each Nare BID   levothyroxine  88 mcg Oral Q0600   losartan  100 mg Oral Daily   metoprolol tartrate  12.5 mg Oral  Once   metoprolol tartrate  25 mg Oral BID   montelukast  10 mg Oral QHS   pantoprazole  40 mg Oral Daily   rOPINIRole  1 mg Oral QHS   rosuvastatin  20 mg Oral Daily   tacrolimus  1 mg Oral BID   Continuous Infusions:  lactated ringers 100 mL/hr at 06/15/22 1223     LOS: 2 days    Time spent: over 30 min    Lacretia Nicks, MD [facility]   To contact the attending provider between 7A-7P or the covering provider during after hours 7P-7A, please log into the web site [email or web address] and access using universal [place] password for that web site. If you do not have the password, please call the hospital operator.  06/15/2022, 4:18 PM

## 2022-06-15 NOTE — Anesthesia Procedure Notes (Signed)
Procedure Name: Intubation Date/Time: 06/15/2022 12:45 PM  Performed by: Kizzie Fantasia, CRNAPre-anesthesia Checklist: Patient identified, Emergency Drugs available, Suction available, Patient being monitored and Timeout performed Patient Re-evaluated:Patient Re-evaluated prior to induction Oxygen Delivery Method: Circle system utilized Preoxygenation: Pre-oxygenation with 100% oxygen Induction Type: IV induction Ventilation: Mask ventilation without difficulty Laryngoscope Size: Glidescope and 4 Grade View: Grade I Tube type: Oral Tube size: 7.0 mm Number of attempts: 2 Airway Equipment and Method: Stylet Placement Confirmation: ETT inserted through vocal cords under direct vision, positive ETCO2 and breath sounds checked- equal and bilateral Secured at: 22 cm Tube secured with: Tape Dental Injury: Teeth and Oropharynx as per pre-operative assessment

## 2022-06-16 ENCOUNTER — Inpatient Hospital Stay (HOSPITAL_COMMUNITY): Payer: Medicare PPO

## 2022-06-16 DIAGNOSIS — K851 Biliary acute pancreatitis without necrosis or infection: Secondary | ICD-10-CM | POA: Diagnosis not present

## 2022-06-16 LAB — CBC WITH DIFFERENTIAL/PLATELET
Abs Immature Granulocytes: 0.03 10*3/uL (ref 0.00–0.07)
Basophils Absolute: 0 10*3/uL (ref 0.0–0.1)
Basophils Relative: 0 %
Eosinophils Absolute: 0 10*3/uL (ref 0.0–0.5)
Eosinophils Relative: 0 %
HCT: 37.2 % (ref 36.0–46.0)
Hemoglobin: 11.1 g/dL — ABNORMAL LOW (ref 12.0–15.0)
Immature Granulocytes: 0 %
Lymphocytes Relative: 9 %
Lymphs Abs: 0.6 10*3/uL — ABNORMAL LOW (ref 0.7–4.0)
MCH: 27.1 pg (ref 26.0–34.0)
MCHC: 29.8 g/dL — ABNORMAL LOW (ref 30.0–36.0)
MCV: 90.7 fL (ref 80.0–100.0)
Monocytes Absolute: 0.5 10*3/uL (ref 0.1–1.0)
Monocytes Relative: 8 %
Neutro Abs: 5.6 10*3/uL (ref 1.7–7.7)
Neutrophils Relative %: 83 %
Platelets: 221 10*3/uL (ref 150–400)
RBC: 4.1 MIL/uL (ref 3.87–5.11)
RDW: 15.2 % (ref 11.5–15.5)
WBC: 6.8 10*3/uL (ref 4.0–10.5)
nRBC: 0 % (ref 0.0–0.2)

## 2022-06-16 LAB — COMPREHENSIVE METABOLIC PANEL
ALT: 23 U/L (ref 0–44)
AST: 17 U/L (ref 15–41)
Albumin: 2.9 g/dL — ABNORMAL LOW (ref 3.5–5.0)
Alkaline Phosphatase: 32 U/L — ABNORMAL LOW (ref 38–126)
Anion gap: 7 (ref 5–15)
BUN: 16 mg/dL (ref 8–23)
CO2: 28 mmol/L (ref 22–32)
Calcium: 8.3 mg/dL — ABNORMAL LOW (ref 8.9–10.3)
Chloride: 99 mmol/L (ref 98–111)
Creatinine, Ser: 0.91 mg/dL (ref 0.44–1.00)
GFR, Estimated: >60 mL/min (ref >60)
Glucose, Bld: 149 mg/dL — ABNORMAL HIGH (ref 70–99)
Potassium: 4.8 mmol/L (ref 3.5–5.1)
Sodium: 134 mmol/L — ABNORMAL LOW (ref 135–145)
Total Bilirubin: 0.5 mg/dL (ref 0.3–1.2)
Total Protein: 6.3 g/dL — ABNORMAL LOW (ref 6.5–8.1)

## 2022-06-16 LAB — MAGNESIUM: Magnesium: 1.9 mg/dL (ref 1.7–2.4)

## 2022-06-16 LAB — PHOSPHORUS: Phosphorus: 4.6 mg/dL (ref 2.5–4.6)

## 2022-06-16 MED ORDER — FUROSEMIDE 10 MG/ML IJ SOLN
20.0000 mg | Freq: Once | INTRAMUSCULAR | Status: AC
  Administered 2022-06-16: 20 mg via INTRAVENOUS
  Filled 2022-06-16: qty 2

## 2022-06-16 MED ORDER — GUAIFENESIN-DM 100-10 MG/5ML PO SYRP
5.0000 mL | ORAL_SOLUTION | ORAL | Status: DC | PRN
  Administered 2022-06-16: 5 mL via ORAL
  Filled 2022-06-16: qty 10

## 2022-06-16 MED ORDER — HYDRALAZINE HCL 20 MG/ML IJ SOLN
5.0000 mg | Freq: Once | INTRAMUSCULAR | Status: AC
  Administered 2022-06-16: 5 mg via INTRAVENOUS
  Filled 2022-06-16: qty 1

## 2022-06-16 MED ORDER — HYDROCHLOROTHIAZIDE 12.5 MG PO TABS: 12.5000 mg | ORAL_TABLET | Freq: Every day | ORAL | Status: DC

## 2022-06-16 MED ORDER — SODIUM CHLORIDE 0.9 % IV SOLN
2.0000 g | INTRAVENOUS | Status: DC
  Administered 2022-06-16 – 2022-06-17 (×2): 2 g via INTRAVENOUS
  Filled 2022-06-16 (×3): qty 20

## 2022-06-16 MED ORDER — HYDRALAZINE HCL 10 MG PO TABS
10.0000 mg | ORAL_TABLET | Freq: Three times a day (TID) | ORAL | Status: DC
  Administered 2022-06-16 – 2022-06-21 (×14): 10 mg via ORAL
  Filled 2022-06-16 (×14): qty 1

## 2022-06-16 NOTE — Progress Notes (Signed)
[facility] Progress Note  1 Day Post-Op  Subjective: CC:  Denies pain at rest. Tolerated CLD after ERCP. Denies nausea/emesis  Objective: Vital signs in last 24 hours: Temp:  [97.2 F (36.2 C)-98.1 F (36.7 C)] 97.8 F (36.6 C) (05/07 0457) Pulse Rate:  [58-74] 67 (05/07 0624) Resp:  [13-20] 20 (05/07 0457) BP: (162-197)/(66-88) 167/66 (05/07 0624) SpO2:  [85 %-99 %] 95 % (05/07 0846) Weight:  [105.6 kg] 105.6 kg (05/07 0500) Last BM Date : 06/12/22  Intake/Output from previous day: 05/06 0701 - 05/07 0700 In: 540 [P.O.:540] Out: 350 [Urine:350] Intake/Output this shift: No intake/output data recorded.  PE: Gen:  Alert, NAD, pleasant Card:  Regular rate and rhythm, pedal pulses 2+ BL Pulm:  Normal effort, clear to auscultation bilaterally Abd: Soft, TTP epigastric region and RUQ without peritonitis Skin: warm and dry, no rashes  Psych: A&Ox3   Lab Results:  Recent Labs    06/15/22 0400 06/16/22 0401  WBC 11.1* 6.8  HGB 12.3 11.1*  HCT 40.6 37.2  PLT 242 221   BMET Recent Labs    06/15/22 0400 06/16/22 0401  NA 137 134*  K 4.3 4.8  CL 102 99  CO2 28 28  GLUCOSE 102* 149*  BUN 14 16  CREATININE 0.92 0.91  CALCIUM 8.2* 8.3*   PT/INR No results for input(s): "LABPROT", "INR" in the last 72 hours. CMP     Component Value Date/Time   NA 134 (L) 06/16/2022 0401   K 4.8 06/16/2022 0401   CL 99 06/16/2022 0401   CO2 28 06/16/2022 0401   GLUCOSE 149 (H) 06/16/2022 0401   BUN 16 06/16/2022 0401   CREATININE 0.91 06/16/2022 0401   CREATININE 0.98 (H) 01/19/2022 0851   CALCIUM 8.3 (L) 06/16/2022 0401   PROT 6.3 (L) 06/16/2022 0401   ALBUMIN 2.9 (L) 06/16/2022 0401   AST 17 06/16/2022 0401   ALT 23 06/16/2022 0401   ALKPHOS 32 (L) 06/16/2022 0401   BILITOT 0.5 06/16/2022 0401   GFRNONAA >60 06/16/2022 0401   GFRNONAA 53 (L) 06/12/2013 0823   GFRAA 52 (L) 02/12/2019 0501   GFRAA 61 06/12/2013 0823   Lipase     Component Value  Date/Time   LIPASE 47 06/15/2022 1015       Studies/Results: ECHOCARDIOGRAM COMPLETE  Result Date: 06/15/2022    ECHOCARDIOGRAM REPORT   Patient Name:   ADMIRE HUME Wohl Date of Exam: 06/15/2022 Medical Rec #:  [id number]              Height:       68.0 in Accession #:    [id number]             Weight:       230.6 lb Date of Birth:  [birth year]              BSA:          2.171 m Patient Age:    81 years               BP:           162/71 mmHg Patient Gender: F                      HR:           62 bpm. Exam Location:  Inpatient Procedure: 2D Echo, Cardiac Doppler and Color Doppler Indications:    Abnormal EKG  History:  Patient has prior history of Echocardiogram examinations, most                 recent 04/11/2019. COPD and Acute Pancreatitis; Risk                 Factors:Hypertension, Dyslipidemia, Sleep Apnea and Diabetes.  Sonographer:    NA Referring Phys: [id number] CALDWELL POWELL JR IMPRESSIONS  1. Left ventricular ejection fraction, by estimation, is 50 to 55%. The left ventricle has low normal function. The left ventricle has no regional wall motion abnormalities. There is mild concentric left ventricular hypertrophy. Left ventricular diastolic parameters are consistent with Grade I diastolic dysfunction (impaired relaxation).  2. Right ventricular systolic function is normal. The right ventricular size is normal. Tricuspid regurgitation signal is inadequate for assessing PA pressure.  3. Left atrial size was mildly dilated.  4. Right atrial size was mildly dilated.  5. The mitral valve is normal in structure. Trivial mitral valve regurgitation. No evidence of mitral stenosis.  6. The aortic valve is tricuspid. There is mild calcification of the aortic valve. Aortic valve regurgitation is not visualized. Mild aortic valve stenosis. Aortic valve area, by VTI measures 1.67 cm. Aortic valve mean gradient measures 9.0 mmHg.  7. The inferior vena cava is dilated in size with <50% respiratory  variability, suggesting right atrial pressure of 15 mmHg. FINDINGS  Left Ventricle: Left ventricular ejection fraction, by estimation, is 50 to 55%. The left ventricle has low normal function. The left ventricle has no regional wall motion abnormalities. The left ventricular internal cavity size was normal in size. There is mild concentric left ventricular hypertrophy. Left ventricular diastolic parameters are consistent with Grade I diastolic dysfunction (impaired relaxation). Right Ventricle: The right ventricular size is normal. No increase in right ventricular wall thickness. Right ventricular systolic function is normal. Tricuspid regurgitation signal is inadequate for assessing PA pressure. Left Atrium: Left atrial size was mildly dilated. Right Atrium: Right atrial size was mildly dilated. Pericardium: There is no evidence of pericardial effusion. Mitral Valve: The mitral valve is normal in structure. There is mild calcification of the mitral valve leaflet(s). Mild mitral annular calcification. Trivial mitral valve regurgitation. No evidence of mitral valve stenosis. MV peak gradient, 5.7 mmHg. The mean mitral valve gradient is 3.0 mmHg. Tricuspid Valve: The tricuspid valve is normal in structure. Tricuspid valve regurgitation is not demonstrated. Aortic Valve: The aortic valve is tricuspid. There is mild calcification of the aortic valve. Aortic valve regurgitation is not visualized. Mild aortic stenosis is present. Aortic valve mean gradient measures 9.0 mmHg. Aortic valve peak gradient measures  15.2 mmHg. Aortic valve area, by VTI measures 1.67 cm. Pulmonic Valve: The pulmonic valve was normal in structure. Pulmonic valve regurgitation is not visualized. Aorta: The aortic root is normal in size and structure. Venous: The inferior vena cava is dilated in size with less than 50% respiratory variability, suggesting right atrial pressure of 15 mmHg. IAS/Shunts: No atrial level shunt detected by color flow  Doppler.  LEFT VENTRICLE PLAX 2D LVIDd:         5.20 cm     Diastology LVIDs:         3.60 cm     LV e' medial:    8.05 cm/s LV PW:         1.20 cm     LV E/e' medial:  12.3 LV IVS:        1.20 cm     LV e' lateral:  8.59 cm/s LVOT diam:     1.90 cm     LV E/e' lateral: 11.6 LV SV:         79 LV SV Index:   36 LVOT Area:     2.84 cm  LV Volumes (MOD) LV vol d, MOD A2C: 91.6 ml LV vol d, MOD A4C: 91.5 ml LV vol s, MOD A2C: 42.0 ml LV vol s, MOD A4C: 37.7 ml LV SV MOD A2C:     49.6 ml LV SV MOD A4C:     91.5 ml LV SV MOD BP:      52.5 ml RIGHT VENTRICLE             IVC RV Basal diam:  3.60 cm     IVC diam: 2.70 cm RV S prime:     15.00 cm/s TAPSE (M-mode): 3.0 cm LEFT ATRIUM             Index        RIGHT ATRIUM           Index LA diam:        3.80 cm 1.75 cm/m   RA Area:     21.30 cm LA Vol (A2C):   64.2 ml 29.57 ml/m  RA Volume:   61.70 ml  28.42 ml/m LA Vol (A4C):   69.3 ml 31.92 ml/m LA Biplane Vol: 68.2 ml 31.41 ml/m  AORTIC VALVE AV Area (Vmax):    1.68 cm AV Area (Vmean):   1.67 cm AV Area (VTI):     1.67 cm AV Vmax:           195.00 cm/s AV Vmean:          140.000 cm/s AV VTI:            0.471 m AV Peak Grad:      15.2 mmHg AV Mean Grad:      9.0 mmHg LVOT Vmax:         115.50 cm/s LVOT Vmean:        82.400 cm/s LVOT VTI:          0.277 m LVOT/AV VTI ratio: 0.59  AORTA Ao Root diam: 2.80 cm Ao Asc diam:  3.40 cm MITRAL VALVE MV Area (PHT): 3.45 cm     SHUNTS MV Area VTI:   1.86 cm     Systemic VTI:  0.28 m MV Peak grad:  5.7 mmHg     Systemic Diam: 1.90 cm MV Mean grad:  3.0 mmHg MV Vmax:       1.19 m/s MV Vmean:      72.8 cm/s MV Decel Time: 220 msec MV E velocity: 99.30 cm/s MV A velocity: 115.00 cm/s MV E/A ratio:  0.86 Dalton McleanMD Electronically signed by Wilfred Lacy Signature Date/Time: 06/15/2022/4:03:24 PM    Final    DG ERCP  Result Date: 06/15/2022 CLINICAL DATA:  Pancreatitis.  Concern for choledocholithiasis. EXAM: ERCP TECHNIQUE: Multiple spot images obtained with the  fluoroscopic device and submitted for interpretation post-procedure. FLUOROSCOPY TIME: FLUOROSCOPY TIME 2.2 mGy COMPARISON:  CT abdomen and pelvis 06/13/2022 FINDINGS: Five spot intraoperative fluoroscopic images of the right upper abdominal quadrant during ERCP are provided for review Initial image demonstrates an ERCP probe overlying the right upper abdominal quadrant. There is selective cannulation and faint opacification of the common bile duct which appears nondilated Subsequent images demonstrate insufflation of a balloon within the central aspect of the CBD with subsequent biliary sweeping and presumed sphincterotomy. There is  faint opacification of the cystic duct. There is minimal opacification of the intrahepatic biliary tree which appears nondilated. There is no definitive opacification of the pancreatic duct. IMPRESSION: ERCP with biliary sweeping and presumed sphincterotomy as above. These images were submitted for radiologic interpretation only. Please see the procedural report for the amount of contrast and the fluoroscopy time utilized. Electronically Signed   By: Simonne Come M.D.   On: 06/15/2022 15:36    Anti-infectives: Anti-infectives (From admission, onward)    None        Assessment/Plan  Gallstone pancreatitis, choledocholithiasis, possible cholecystitis  - s/p ERCP sphincterotomy 5/6 Dr. Ewing Schlein  - afebrile, VSS, WBC 6.8 from 11, LFTs remain unremarkable - lipase was 47 yesterday from over 3,000 on admission - clinically she remains tender in the epigastric region and RUQ. CT 5/4 with significant pancreatitis without pseudocyst or fluid collection. Continue IV abx. Possible OR tomorrow if pain improving.   FEN: SOFT, NPO MN ID: Rocephin  VTE: SCD's, lovenox  Foley: none Dispo: medsurg, TRH, lap chole this admission.    LOS: 3 days   I reviewed nursing notes, hospitalist notes, last 24 h vitals and pain scores, last 48 h intake and output, last 24 h labs and trends, and  last 24 h imaging results.  This care required moderate level of medical decision making.   Hosie Spangle, PA-C [facility] Please see Amion for pager number during day hours 7:00am-4:30pm

## 2022-06-16 NOTE — Progress Notes (Signed)
Arman Filter 5:34 PM  Subjective: Patient doing well without any obvious ERCP problems and wants to eat and had breakfast for lunch and no new complaints  Objective: Vital signs stable afebrile no acute distress abdomen is soft nontender labs okay white count okay  Assessment: Status post ERCP for CBD stones  Plan: Okay to proceed with lap chole tomorrow from my standpoint please call me if I could be of any further assistance with this hospital stay  [facility]  office [phone] After 5PM or if no answer call [phone]

## 2022-06-16 NOTE — Progress Notes (Signed)
Mobility Specialist - Progress Note   06/16/22 1420  Mobility  Activity Ambulated with assistance in hallway  Level of Assistance Standby assist, set-up cues, supervision of patient - no hands on  Assistive Device Front wheel walker  Distance Ambulated (ft) 160 ft  Activity Response Tolerated well  Mobility Referral Yes  $Mobility charge 1 Mobility  Mobility Specialist Start Time (ACUTE ONLY) 0213  Mobility Specialist Stop Time (ACUTE ONLY) 0219  Mobility Specialist Time Calculation (min) (ACUTE ONLY) 6 min   Pt received in bed and agreeable to mobility. No complaints during session. Pt to bed after session with all needs met.     [facility]

## 2022-06-16 NOTE — Progress Notes (Signed)
Mobility Specialist - Progress Note   06/16/22 1105  Mobility  Activity Ambulated with assistance in hallway  Level of Assistance Standby assist, set-up cues, supervision of patient - no hands on  Assistive Device Front wheel walker  Distance Ambulated (ft) 160 ft  Activity Response Tolerated well  Mobility Referral Yes  $Mobility charge 1 Mobility  Mobility Specialist Start Time (ACUTE ONLY) 1053  Mobility Specialist Stop Time (ACUTE ONLY) 1101  Mobility Specialist Time Calculation (min) (ACUTE ONLY) 8 min   Pt received in bed and agreeable to mobility. No complaints during session. Pt to bed after session with all needs met.    [facility]

## 2022-06-16 NOTE — Progress Notes (Signed)
PROGRESS NOTE    [name]  ZOX:[id number] DOB: 01-Dec-1940 DOA: 06/13/2022 PCP: [name], NP  Chief Complaint  Patient presents with   Chest Pain    Brief Narrative:   [name] is [name] 82 y.o. female with medical history significant of COPD, GERD, hypothyroidism, essential hypertension, hyperlipidemia, hyperlipidemia, asthma, history of pulmonary embolism on Eliquis who presented to med [facility] with chest and epigastric pain.  Found to have gallstone pancreatitis.  Now s/p ERCP.  Plan for surgery, likely tmrw.  Assessment & Plan:   Principal Problem:   Acute pancreatitis Active Problems:   Hypothyroidism   Essential hypertension, benign   Mixed hyperlipidemia   COPD (chronic obstructive pulmonary disease) (HCC)   GERD without esophagitis   Pulmonary embolism (HCC)   OSA (obstructive sleep apnea)  Gallstone Pancreatitis  Choledocholithiasis  Cholelithiasis Lipased >3000, CT with acute pancreatitis, small calculi in region of distal common bile duct.  Cholelithiasis and probable choledocholithiasis.  Normal LFT's. Some meds have association with pancreatitis, but with chololithiasis and choledocholithiasis -> more likely cause Pain seems to be improving Follow with diet per surgery Abx per surgery GI consult, appreciate recs -> s/p ERCP, s/p biliary sphincterotomy and balloon extraction  Surgery c/s, appreciate recs  -> possible OR tmrw RCRI 1 for intraabdominal surgery, echo as noted below.  Likely no benefit to additional preop testing.  COPD/age most notable risk factors.  Atrial Tachycardia Tele with runs of atrial tachy and sinus rhythm  Uptitrate metop as tolerated Echo with EF 50-55%, no RWMA, grade 1 diastolic dysfunction, mild dilation of LA and RA, IVC dilated with < 50% resp variability I think cardiac monitoring (ziopatch) after discharge may be warranted with cards follow up Continue tele  Elevated BNP  Dilated  IVC with <50% Respiratory Variability Not grossly overloaded, but requiring 1 L today Net positive 6 L Will give lasix and follow  Follow CXR  Hypertension Follow BP on losartan and metoprolol  Lasix today (continue as needed for volume) Hydralazine added, titrate prn  Minimal Change Disease  CKD  Tacrolimus  COPD Currently on 1 L, will work on weaning this Continue controller med, prn rescue inhaler  Hx PE Eliquis on hold with need for possible procedure - ok to hold, 1x PE, ~4 years ago per her report - lovenox for now  Hypothyroidism Synthroid  Dyslipidemia statin    DVT prophylaxis: lovenxo Code Status: full Family Communication: none Disposition:   Status is: Inpatient Remains inpatient appropriate because: need for GI c/s, continued workup    Consultants:  GI  Procedures:  Echo IMPRESSIONS     1. Left ventricular ejection fraction, by estimation, is 50 to 55%. The  left ventricle has low normal function. The left ventricle has no regional  wall motion abnormalities. There is mild concentric left ventricular  hypertrophy. Left ventricular  diastolic parameters are consistent with Grade I diastolic dysfunction  (impaired relaxation).   2. Right ventricular systolic function is normal. The right ventricular  size is normal. Tricuspid regurgitation signal is inadequate for assessing  PA pressure.   3. Left atrial size was mildly dilated.   4. Right atrial size was mildly dilated.   5. The mitral valve is normal in structure. Trivial mitral valve  regurgitation. No evidence of mitral stenosis.   6. The aortic valve is tricuspid. There is mild calcification of the  aortic valve. Aortic valve regurgitation is not visualized. Mild aortic  valve stenosis. Aortic valve  area, by VTI measures 1.67 cm. Aortic valve  mean gradient measures 9.0 mmHg.   7. The inferior vena cava is dilated in size with <50% respiratory  variability, suggesting right atrial  pressure of 15 mmHg.   ERCP - The major papilla appeared normal. - Choledocholithiasis was found. Complete removal was accomplished by biliary sphincterotomy and balloon extraction. - [name] biliary sphincterotomy was performed. - The biliary tree was swept and nothing was found. - Clear liquid diet today. N. p. o. after midnight for possible lap chole tomorrow - Continue present medications.  Antimicrobials:  Anti-infectives (From admission, onward)    Start     Dose/Rate Route Frequency Ordered Stop   06/16/22 1115  cefTRIAXone (ROCEPHIN) 2 g in sodium chloride 0.9 % 100 mL IVPB        2 g 200 mL/hr over 30 Minutes Intravenous Every 24 hours 06/16/22 1022         Subjective: Less abdominal pain Discussed with daughter outside room   Objective: Vitals:   06/16/22 0624 06/16/22 0845 06/16/22 0846 06/16/22 1306  BP: (!) 167/66   (!) 181/81  Pulse: 67   70  Resp:    (!) 22  Temp:    97.9 F (36.6 C)  TempSrc:    Oral  SpO2: 95% 95% 95% 100%  Weight:      Height:        Intake/Output Summary (Last 24 hours) at 06/16/2022 1356 Last data filed at 06/16/2022 1040 Gross per 24 hour  Intake 540 ml  Output 550 ml  Net -10 ml   Filed Weights   06/14/22 0229 06/16/22 0500  Weight: 104.6 kg 105.6 kg    Examination:  General: No acute distress. Cardiovascular: Heart sounds show [name] regular rate, and rhythm.  Lungs: Clear to auscultation bilaterally  Abdomen: Soft, nontender, nondistended  Neurological: Alert and oriented 3. Moves all extremities 4 with equal strength. Cranial nerves II through XII grossly intact. Extremities: No clubbing or cyanosis. No edema.  Data Reviewed: I have personally reviewed following labs and imaging studies  CBC: Recent Labs  Lab 06/13/22 0504 06/13/22 1857 06/14/22 0417 06/15/22 0400 06/16/22 0401  WBC 15.3* 11.6* 11.1* 11.1* 6.8  NEUTROABS 12.1*  --   --  7.7 5.6  HGB 13.0 12.2 12.3 12.3 11.1*  HCT 40.7 40.2 41.0 40.6 37.2  MCV 86.4 90.3  91.5 91.4 90.7  PLT 296 262 269 242 221    Basic Metabolic Panel: Recent Labs  Lab 06/13/22 0409 06/13/22 0800 06/13/22 1857 06/14/22 0417 06/15/22 0400 06/16/22 0401  NA 141  --   --  140 137 134*  K 3.6  --   --  4.4 4.3 4.8  CL 103  --   --  104 102 99  CO2 28  --   --  29 28 28   GLUCOSE 151*  --   --  127* 102* 149*  BUN 20  --   --  15 14 16   CREATININE 1.08*  --  0.90 0.90 0.92 0.91  CALCIUM 8.8*  --   --  8.0* 8.2* 8.3*  MG  --  1.6*  --   --  1.8 1.9  PHOS  --  3.6  --   --  3.8 4.6    GFR: Estimated Creatinine Clearance: 60.6 mL/min (by C-G formula based on SCr of 0.91 mg/dL).  Liver Function Tests: Recent Labs  Lab 06/13/22 0800 06/14/22 0417 06/15/22 0400 06/16/22 0401  AST 42* 23  18 17  ALT 23 22 19 23   ALKPHOS 38 39 43 32*  BILITOT 0.5 0.5 0.8 0.5  PROT 6.5 6.9 7.3 6.3*  ALBUMIN 3.4* 3.2* 3.2* 2.9*    CBG: No results for input(s): "GLUCAP" in the last 168 hours.   No results found for this or any previous visit (from the past 240 hour(s)).       Radiology Studies: ECHOCARDIOGRAM COMPLETE  Result Date: 06/15/2022    ECHOCARDIOGRAM REPORT   Patient Name:   [name] Date of Exam: 06/15/2022 Medical Rec #:  [id number]              Height:       68.0 in Accession #:    [id number]             Weight:       230.6 lb Date of Birth:  Sep 11, 1940              BSA:          2.171 m Patient Age:    81 years               BP:           162/71 mmHg Patient Gender: F                      HR:           62 bpm. Exam Location:  Inpatient Procedure: 2D Echo, Cardiac Doppler and Color Doppler Indications:    Abnormal EKG  History:        Patient has prior history of Echocardiogram examinations, most                 recent 04/11/2019. COPD and Acute Pancreatitis; Risk                 Factors:Hypertension, Dyslipidemia, Sleep Apnea and Diabetes.  Sonographer:    NA Referring Phys: [id number] [name] CALDWELL POWELL JR IMPRESSIONS  1. Left ventricular ejection fraction, by  estimation, is 50 to 55%. The left ventricle has low normal function. The left ventricle has no regional wall motion abnormalities. There is mild concentric left ventricular hypertrophy. Left ventricular diastolic parameters are consistent with Grade I diastolic dysfunction (impaired relaxation).  2. Right ventricular systolic function is normal. The right ventricular size is normal. Tricuspid regurgitation signal is inadequate for assessing PA pressure.  3. Left atrial size was mildly dilated.  4. Right atrial size was mildly dilated.  5. The mitral valve is normal in structure. Trivial mitral valve regurgitation. No evidence of mitral stenosis.  6. The aortic valve is tricuspid. There is mild calcification of the aortic valve. Aortic valve regurgitation is not visualized. Mild aortic valve stenosis. Aortic valve area, by VTI measures 1.67 cm. Aortic valve mean gradient measures 9.0 mmHg.  7. The inferior vena cava is dilated in size with <50% respiratory variability, suggesting right atrial pressure of 15 mmHg. FINDINGS  Left Ventricle: Left ventricular ejection fraction, by estimation, is 50 to 55%. The left ventricle has low normal function. The left ventricle has no regional wall motion abnormalities. The left ventricular internal cavity size was normal in size. There is mild concentric left ventricular hypertrophy. Left ventricular diastolic parameters are consistent with Grade I diastolic dysfunction (impaired relaxation). Right Ventricle: The right ventricular size is normal. No increase in right ventricular wall thickness. Right ventricular systolic function is normal. Tricuspid regurgitation signal is inadequate for assessing PA pressure.  Left Atrium: Left atrial size was mildly dilated. Right Atrium: Right atrial size was mildly dilated. Pericardium: There is no evidence of pericardial effusion. Mitral Valve: The mitral valve is normal in structure. There is mild calcification of the mitral valve  leaflet(s). Mild mitral annular calcification. Trivial mitral valve regurgitation. No evidence of mitral valve stenosis. MV peak gradient, 5.7 mmHg. The mean mitral valve gradient is 3.0 mmHg. Tricuspid Valve: The tricuspid valve is normal in structure. Tricuspid valve regurgitation is not demonstrated. Aortic Valve: The aortic valve is tricuspid. There is mild calcification of the aortic valve. Aortic valve regurgitation is not visualized. Mild aortic stenosis is present. Aortic valve mean gradient measures 9.0 mmHg. Aortic valve peak gradient measures  15.2 mmHg. Aortic valve area, by VTI measures 1.67 cm. Pulmonic Valve: The pulmonic valve was normal in structure. Pulmonic valve regurgitation is not visualized. Aorta: The aortic root is normal in size and structure. Venous: The inferior vena cava is dilated in size with less than 50% respiratory variability, suggesting right atrial pressure of 15 mmHg. IAS/Shunts: No atrial level shunt detected by color flow Doppler.  LEFT VENTRICLE PLAX 2D LVIDd:         5.20 cm     Diastology LVIDs:         3.60 cm     LV e' medial:    8.05 cm/s LV PW:         1.20 cm     LV E/e' medial:  12.3 LV IVS:        1.20 cm     LV e' lateral:   8.59 cm/s LVOT diam:     1.90 cm     LV E/e' lateral: 11.6 LV SV:         79 LV SV Index:   36 LVOT Area:     2.84 cm  LV Volumes (MOD) LV vol d, MOD A2C: 91.6 ml LV vol d, MOD A4C: 91.5 ml LV vol s, MOD A2C: 42.0 ml LV vol s, MOD A4C: 37.7 ml LV SV MOD A2C:     49.6 ml LV SV MOD A4C:     91.5 ml LV SV MOD BP:      52.5 ml RIGHT VENTRICLE             IVC RV Basal diam:  3.60 cm     IVC diam: 2.70 cm RV S prime:     15.00 cm/s TAPSE (M-mode): 3.0 cm LEFT ATRIUM             Index        RIGHT ATRIUM           Index LA diam:        3.80 cm 1.75 cm/m   RA Area:     21.30 cm LA Vol (A2C):   64.2 ml 29.57 ml/m  RA Volume:   61.70 ml  28.42 ml/m LA Vol (A4C):   69.3 ml 31.92 ml/m LA Biplane Vol: 68.2 ml 31.41 ml/m  AORTIC VALVE AV Area (Vmax):     1.68 cm AV Area (Vmean):   1.67 cm AV Area (VTI):     1.67 cm AV Vmax:           195.00 cm/s AV Vmean:          140.000 cm/s AV VTI:            0.471 m AV Peak Grad:      15.2 mmHg AV Mean Grad:  9.0 mmHg LVOT Vmax:         115.50 cm/s LVOT Vmean:        82.400 cm/s LVOT VTI:          0.277 m LVOT/AV VTI ratio: 0.59  AORTA Ao Root diam: 2.80 cm Ao Asc diam:  3.40 cm MITRAL VALVE MV Area (PHT): 3.45 cm     SHUNTS MV Area VTI:   1.86 cm     Systemic VTI:  0.28 m MV Peak grad:  5.7 mmHg     Systemic Diam: 1.90 cm MV Mean grad:  3.0 mmHg MV Vmax:       1.19 m/s MV Vmean:      72.8 cm/s MV Decel Time: 220 msec MV E velocity: 99.30 cm/s MV Brier Reid velocity: 115.00 cm/s MV E/Allix Blomquist ratio:  0.86 Dalton McleanMD Electronically signed by Wilfred Lacy Signature Date/Time: 06/15/2022/4:03:24 PM    Final    DG ERCP  Result Date: 06/15/2022 CLINICAL DATA:  Pancreatitis.  Concern for choledocholithiasis. EXAM: ERCP TECHNIQUE: Multiple spot images obtained with the fluoroscopic device and submitted for interpretation post-procedure. FLUOROSCOPY TIME: FLUOROSCOPY TIME 2.2 mGy COMPARISON:  CT abdomen and pelvis 06/13/2022 FINDINGS: Five spot intraoperative fluoroscopic images of the right upper abdominal quadrant during ERCP are provided for review Initial image demonstrates an ERCP probe overlying the right upper abdominal quadrant. There is selective cannulation and faint opacification of the common bile duct which appears nondilated Subsequent images demonstrate insufflation of Alekzander Cardell balloon within the central aspect of the CBD with subsequent biliary sweeping and presumed sphincterotomy. There is faint opacification of the cystic duct. There is minimal opacification of the intrahepatic biliary tree which appears nondilated. There is no definitive opacification of the pancreatic duct. IMPRESSION: ERCP with biliary sweeping and presumed sphincterotomy as above. These images were submitted for radiologic interpretation only. Please  see the procedural report for the amount of contrast and the fluoroscopy time utilized. Electronically Signed   By: Simonne Come M.D.   On: 06/15/2022 15:36        Scheduled Meds:  citalopram  20 mg Oral Daily   donepezil  10 mg Oral QHS   enoxaparin (LOVENOX) injection  40 mg Subcutaneous Q24H   fluticasone furoate-vilanterol  1 puff Inhalation Daily   And   umeclidinium bromide  1 puff Inhalation Daily   ipratropium  2 spray Each Nare BID   levothyroxine  88 mcg Oral Q0600   losartan  100 mg Oral Daily   metoprolol tartrate  25 mg Oral BID   montelukast  10 mg Oral QHS   pantoprazole  40 mg Oral Daily   rOPINIRole  1 mg Oral QHS   rosuvastatin  20 mg Oral Daily   tacrolimus  1 mg Oral BID   Continuous Infusions:  cefTRIAXone (ROCEPHIN)  IV 2 g (06/16/22 1333)     LOS: 3 days    Time spent: over 30 min    Lacretia Nicks, MD [facility]   To contact the attending provider between 7A-7P or the covering provider during after hours 7P-7A, please log into the web site [email or web address] and access using universal  password for that web site. If you do not have the password, please call the hospital operator.  06/16/2022, 1:56 PM

## 2022-06-17 ENCOUNTER — Encounter (HOSPITAL_COMMUNITY)
Admission: EM | Disposition: A | Discharge status: Home / Self Care | Payer: Self-pay | Source: Home / Self Care | Attending: Internal Medicine

## 2022-06-17 ENCOUNTER — Inpatient Hospital Stay (HOSPITAL_COMMUNITY): Payer: Medicare PPO | Admitting: Anesthesiology

## 2022-06-17 ENCOUNTER — Other Ambulatory Visit: Payer: Self-pay

## 2022-06-17 DIAGNOSIS — J449 Chronic obstructive pulmonary disease, unspecified: Secondary | ICD-10-CM

## 2022-06-17 DIAGNOSIS — I10 Essential (primary) hypertension: Secondary | ICD-10-CM | POA: Diagnosis not present

## 2022-06-17 DIAGNOSIS — K859 Acute pancreatitis without necrosis or infection, unspecified: Secondary | ICD-10-CM | POA: Diagnosis not present

## 2022-06-17 DIAGNOSIS — K858 Other acute pancreatitis without necrosis or infection: Secondary | ICD-10-CM

## 2022-06-17 DIAGNOSIS — Z87891 Personal history of nicotine dependence: Secondary | ICD-10-CM | POA: Diagnosis not present

## 2022-06-17 HISTORY — PX: CHOLECYSTECTOMY: SHX55

## 2022-06-17 LAB — CBC WITH DIFFERENTIAL/PLATELET
Abs Immature Granulocytes: 0.02 10*3/uL (ref 0.00–0.07)
Basophils Absolute: 0 10*3/uL (ref 0.0–0.1)
Basophils Relative: 0 %
Eosinophils Absolute: 0.1 10*3/uL (ref 0.0–0.5)
Eosinophils Relative: 1 %
HCT: 36.2 % (ref 36.0–46.0)
Hemoglobin: 10.9 g/dL — ABNORMAL LOW (ref 12.0–15.0)
Immature Granulocytes: 0 %
Lymphocytes Relative: 12 %
Lymphs Abs: 1.4 10*3/uL (ref 0.7–4.0)
MCH: 27.1 pg (ref 26.0–34.0)
MCHC: 30.1 g/dL (ref 30.0–36.0)
MCV: 90 fL (ref 80.0–100.0)
Monocytes Absolute: 1.5 10*3/uL — ABNORMAL HIGH (ref 0.1–1.0)
Monocytes Relative: 14 %
Neutro Abs: 8 10*3/uL — ABNORMAL HIGH (ref 1.7–7.7)
Neutrophils Relative %: 73 %
Platelets: 241 10*3/uL (ref 150–400)
RBC: 4.02 MIL/uL (ref 3.87–5.11)
RDW: 15.6 % — ABNORMAL HIGH (ref 11.5–15.5)
WBC: 11 10*3/uL — ABNORMAL HIGH (ref 4.0–10.5)
nRBC: 0 % (ref 0.0–0.2)

## 2022-06-17 LAB — COMPREHENSIVE METABOLIC PANEL
ALT: 23 U/L (ref 0–44)
AST: 20 U/L (ref 15–41)
Albumin: 3.1 g/dL — ABNORMAL LOW (ref 3.5–5.0)
Alkaline Phosphatase: 33 U/L — ABNORMAL LOW (ref 38–126)
Anion gap: 9 (ref 5–15)
BUN: 14 mg/dL (ref 8–23)
CO2: 29 mmol/L (ref 22–32)
Calcium: 8 mg/dL — ABNORMAL LOW (ref 8.9–10.3)
Chloride: 98 mmol/L (ref 98–111)
Creatinine, Ser: 0.91 mg/dL (ref 0.44–1.00)
GFR, Estimated: >60 mL/min (ref >60)
Glucose, Bld: 135 mg/dL — ABNORMAL HIGH (ref 70–99)
Potassium: 3.3 mmol/L — ABNORMAL LOW (ref 3.5–5.1)
Sodium: 136 mmol/L (ref 135–145)
Total Bilirubin: 0.7 mg/dL (ref 0.3–1.2)
Total Protein: 6.4 g/dL — ABNORMAL LOW (ref 6.5–8.1)

## 2022-06-17 LAB — MAGNESIUM: Magnesium: 1.5 mg/dL — ABNORMAL LOW (ref 1.7–2.4)

## 2022-06-17 LAB — PHOSPHORUS: Phosphorus: 3.2 mg/dL (ref 2.5–4.6)

## 2022-06-17 SURGERY — LAPAROSCOPIC CHOLECYSTECTOMY
Anesthesia: General

## 2022-06-17 MED ORDER — DEXAMETHASONE SODIUM PHOSPHATE 10 MG/ML IJ SOLN
INTRAMUSCULAR | Status: DC | PRN
  Administered 2022-06-17: 10 mg via INTRAVENOUS

## 2022-06-17 MED ORDER — ONDANSETRON HCL 4 MG/2ML IJ SOLN: 4.0000 mg | Freq: Once | INTRAMUSCULAR | Status: DC | PRN

## 2022-06-17 MED ORDER — ORAL CARE MOUTH RINSE: 15.0000 mL | Freq: Once | OROMUCOSAL | Status: AC

## 2022-06-17 MED ORDER — ROCURONIUM BROMIDE 100 MG/10ML IV SOLN
INTRAVENOUS | Status: DC | PRN
  Administered 2022-06-17: 60 mg via INTRAVENOUS

## 2022-06-17 MED ORDER — ONDANSETRON HCL 4 MG/2ML IJ SOLN
INTRAMUSCULAR | Status: AC
  Filled 2022-06-17: qty 2

## 2022-06-17 MED ORDER — LACTATED RINGERS IV SOLN: INTRAVENOUS | Status: DC | PRN

## 2022-06-17 MED ORDER — FENTANYL CITRATE (PF) 100 MCG/2ML IJ SOLN
INTRAMUSCULAR | Status: AC
  Filled 2022-06-17: qty 2

## 2022-06-17 MED ORDER — PROPOFOL 10 MG/ML IV BOLUS
INTRAVENOUS | Status: DC | PRN
  Administered 2022-06-17: 100 mg via INTRAVENOUS

## 2022-06-17 MED ORDER — POTASSIUM CHLORIDE CRYS ER 20 MEQ PO TBCR
40.0000 meq | EXTENDED_RELEASE_TABLET | Freq: Once | ORAL | Status: AC
  Administered 2022-06-17: 40 meq via ORAL
  Filled 2022-06-17: qty 2

## 2022-06-17 MED ORDER — ACETAMINOPHEN 325 MG PO TABS: 325.0000 mg | ORAL_TABLET | Freq: Four times a day (QID) | ORAL | Status: DC | PRN

## 2022-06-17 MED ORDER — EPHEDRINE SULFATE-NACL 50-0.9 MG/10ML-% IV SOSY
PREFILLED_SYRINGE | INTRAVENOUS | Status: DC | PRN
  Administered 2022-06-17: 5 mg via INTRAVENOUS

## 2022-06-17 MED ORDER — FENTANYL CITRATE (PF) 100 MCG/2ML IJ SOLN
INTRAMUSCULAR | Status: DC | PRN
  Administered 2022-06-17 (×2): 50 ug via INTRAVENOUS

## 2022-06-17 MED ORDER — LIDOCAINE 5 % EX PTCH
1.0000 | MEDICATED_PATCH | CUTANEOUS | Status: DC
  Administered 2022-06-17 – 2022-06-20 (×4): 1 via TRANSDERMAL
  Filled 2022-06-17 (×4): qty 1

## 2022-06-17 MED ORDER — LACTATED RINGERS IR SOLN
Status: DC | PRN
  Administered 2022-06-17: 1000 mL

## 2022-06-17 MED ORDER — TRAZODONE HCL 50 MG PO TABS
50.0000 mg | ORAL_TABLET | Freq: Every evening | ORAL | Status: DC | PRN
  Administered 2022-06-18: 50 mg via ORAL
  Filled 2022-06-17: qty 1

## 2022-06-17 MED ORDER — METOPROLOL TARTRATE 5 MG/5ML IV SOLN: 5.0000 mg | INTRAVENOUS | Status: DC | PRN

## 2022-06-17 MED ORDER — OXYCODONE HCL 5 MG PO TABS
ORAL_TABLET | ORAL | Status: AC
  Filled 2022-06-17: qty 1

## 2022-06-17 MED ORDER — 0.9 % SODIUM CHLORIDE (POUR BTL) OPTIME
TOPICAL | Status: DC | PRN
  Administered 2022-06-17: 1000 mL

## 2022-06-17 MED ORDER — LIDOCAINE HCL (CARDIAC) PF 100 MG/5ML IV SOSY
PREFILLED_SYRINGE | INTRAVENOUS | Status: DC | PRN
  Administered 2022-06-17: 60 mg via INTRAVENOUS

## 2022-06-17 MED ORDER — FENTANYL CITRATE PF 50 MCG/ML IJ SOSY: 25.0000 ug | PREFILLED_SYRINGE | INTRAMUSCULAR | Status: DC | PRN

## 2022-06-17 MED ORDER — OXYCODONE HCL 5 MG/5ML PO SOLN: 5.0000 mg | Freq: Once | ORAL | Status: AC | PRN

## 2022-06-17 MED ORDER — ACETAMINOPHEN 325 MG PO TABS: 650.0000 mg | ORAL_TABLET | Freq: Four times a day (QID) | ORAL | Status: DC | PRN

## 2022-06-17 MED ORDER — ENOXAPARIN SODIUM 40 MG/0.4ML IJ SOSY: 40.0000 mg | PREFILLED_SYRINGE | INTRAMUSCULAR | Status: DC

## 2022-06-17 MED ORDER — SUGAMMADEX SODIUM 200 MG/2ML IV SOLN
INTRAVENOUS | Status: DC | PRN
  Administered 2022-06-17: 400 mg via INTRAVENOUS

## 2022-06-17 MED ORDER — CHLORHEXIDINE GLUCONATE 0.12 % MT SOLN
15.0000 mL | Freq: Once | OROMUCOSAL | Status: AC
  Administered 2022-06-17: 15 mL via OROMUCOSAL

## 2022-06-17 MED ORDER — BUPIVACAINE HCL (PF) 0.25 % IJ SOLN
INTRAMUSCULAR | Status: DC | PRN
  Administered 2022-06-17: 30 mL

## 2022-06-17 MED ORDER — FENTANYL CITRATE PF 50 MCG/ML IJ SOSY
25.0000 ug | PREFILLED_SYRINGE | INTRAMUSCULAR | Status: DC | PRN
  Administered 2022-06-17 (×2): 50 ug via INTRAVENOUS

## 2022-06-17 MED ORDER — ONDANSETRON HCL 4 MG/2ML IJ SOLN
INTRAMUSCULAR | Status: DC | PRN
  Administered 2022-06-17: 4 mg via INTRAVENOUS

## 2022-06-17 MED ORDER — SENNOSIDES-DOCUSATE SODIUM 8.6-50 MG PO TABS
1.0000 | ORAL_TABLET | Freq: Every evening | ORAL | Status: DC | PRN
  Administered 2022-06-20 (×2): 1 via ORAL
  Filled 2022-06-17 (×2): qty 1

## 2022-06-17 MED ORDER — OXYCODONE HCL 5 MG PO TABS
5.0000 mg | ORAL_TABLET | ORAL | Status: DC | PRN
  Administered 2022-06-18 (×2): 5 mg via ORAL
  Filled 2022-06-17 (×3): qty 1

## 2022-06-17 MED ORDER — POTASSIUM CHLORIDE 10 MEQ/100ML IV SOLN
10.0000 meq | INTRAVENOUS | Status: AC
  Filled 2022-06-17: qty 100

## 2022-06-17 MED ORDER — DIPHENHYDRAMINE HCL 50 MG/ML IJ SOLN
INTRAMUSCULAR | Status: AC
  Filled 2022-06-17: qty 1

## 2022-06-17 MED ORDER — FENTANYL CITRATE PF 50 MCG/ML IJ SOSY
PREFILLED_SYRINGE | INTRAMUSCULAR | Status: AC
  Administered 2022-06-17: 50 ug via INTRAVENOUS
  Filled 2022-06-17: qty 3

## 2022-06-17 MED ORDER — SUCCINYLCHOLINE CHLORIDE 200 MG/10ML IV SOSY
PREFILLED_SYRINGE | INTRAVENOUS | Status: DC | PRN
  Administered 2022-06-17: 120 mg via INTRAVENOUS

## 2022-06-17 MED ORDER — BUPIVACAINE HCL 0.25 % IJ SOLN
INTRAMUSCULAR | Status: AC
  Filled 2022-06-17: qty 1

## 2022-06-17 MED ORDER — DEXAMETHASONE SODIUM PHOSPHATE 10 MG/ML IJ SOLN
INTRAMUSCULAR | Status: AC
  Filled 2022-06-17: qty 1

## 2022-06-17 MED ORDER — MAGNESIUM SULFATE 4 GM/100ML IV SOLN
4.0000 g | Freq: Once | INTRAVENOUS | Status: AC
  Administered 2022-06-17: 4 g via INTRAVENOUS
  Filled 2022-06-17: qty 100

## 2022-06-17 MED ORDER — DIPHENHYDRAMINE HCL 50 MG/ML IJ SOLN
INTRAMUSCULAR | Status: DC | PRN
  Administered 2022-06-17: 12.5 mg via INTRAVENOUS

## 2022-06-17 MED ORDER — OXYCODONE HCL 5 MG PO TABS
5.0000 mg | ORAL_TABLET | Freq: Once | ORAL | Status: AC | PRN
  Administered 2022-06-17: 5 mg via ORAL

## 2022-06-17 MED ORDER — LACTATED RINGERS IV SOLN: INTRAVENOUS | Status: DC

## 2022-06-17 MED ORDER — IPRATROPIUM-ALBUTEROL 0.5-2.5 (3) MG/3ML IN SOLN: 3.0000 mL | RESPIRATORY_TRACT | Status: DC | PRN

## 2022-06-17 SURGICAL SUPPLY — 48 items
ADH SKN CLS APL DERMABOND .7 (GAUZE/BANDAGES/DRESSINGS) ×1
APL PRP STRL LF DISP 70% ISPRP (MISCELLANEOUS) ×1
APPLIER CLIP 5 13 M/L LIGAMAX5 (MISCELLANEOUS) ×1
APR CLP MED LRG 5 ANG JAW (MISCELLANEOUS) ×1
BAG COUNTER SPONGE SURGICOUNT (BAG) IMPLANT
BAG SPEC RTRVL 10 TROC 200 (ENDOMECHANICALS)
BAG SPNG CNTER NS LX DISP (BAG)
CHLORAPREP W/TINT 26 (MISCELLANEOUS) ×1 IMPLANT
CLIP APPLIE 5 13 M/L LIGAMAX5 (MISCELLANEOUS) ×1 IMPLANT
COVER MAYO STAND XLG (MISCELLANEOUS) ×1 IMPLANT
COVER SURGICAL LIGHT HANDLE (MISCELLANEOUS) ×1 IMPLANT
DERMABOND ADVANCED .7 DNX12 (GAUZE/BANDAGES/DRESSINGS) ×1 IMPLANT
DRAPE C-ARM 42X120 X-RAY (DRAPES) IMPLANT
ELECT L-HOOK LAP 45CM DISP (ELECTROSURGICAL)
ELECT PENCIL ROCKER SW 15FT (MISCELLANEOUS) ×1 IMPLANT
ELECT REM PT RETURN 15FT ADLT (MISCELLANEOUS) ×1 IMPLANT
ELECTRODE L-HOOK LAP 45CM DISP (ELECTROSURGICAL) IMPLANT
ENDOLOOP SUT PDS II  0 18 (SUTURE) ×1
ENDOLOOP SUT PDS II 0 18 (SUTURE) IMPLANT
GLOVE BIOGEL PI IND STRL 6 (GLOVE) ×1 IMPLANT
GLOVE BIOGEL PI MICRO STRL 5.5 (GLOVE) ×1 IMPLANT
GOWN STRL REUS W/ TWL LRG LVL3 (GOWN DISPOSABLE) ×1 IMPLANT
GOWN STRL REUS W/TWL LRG LVL3 (GOWN DISPOSABLE) ×1
GRASPER SUT TROCAR 14GX15 (MISCELLANEOUS) IMPLANT
HEMOSTAT SNOW SURGICEL 2X4 (HEMOSTASIS) IMPLANT
IRRIG SUCT STRYKERFLOW 2 WTIP (MISCELLANEOUS) ×1
IRRIGATION SUCT STRKRFLW 2 WTP (MISCELLANEOUS) ×1 IMPLANT
KIT BASIN OR (CUSTOM PROCEDURE TRAY) ×1 IMPLANT
KIT TURNOVER KIT A (KITS) IMPLANT
L-HOOK LAP DISP 36CM (ELECTROSURGICAL) ×1
LHOOK LAP DISP 36CM (ELECTROSURGICAL) ×1 IMPLANT
NDL INSUFFLATION 14GA 120MM (NEEDLE) IMPLANT
NEEDLE INSUFFLATION 14GA 120MM (NEEDLE) IMPLANT
POUCH RETRIEVAL ECOSAC 10 (ENDOMECHANICALS) IMPLANT
SCISSORS LAP 5X35 DISP (ENDOMECHANICALS) ×1 IMPLANT
SET CHOLANGIOGRAPH MIX (MISCELLANEOUS) IMPLANT
SET TUBE SMOKE EVAC HIGH FLOW (TUBING) ×1 IMPLANT
SLEEVE Z-THREAD 5X100MM (TROCAR) ×2 IMPLANT
SPIKE FLUID TRANSFER (MISCELLANEOUS) ×1 IMPLANT
SUT MNCRL AB 4-0 PS2 18 (SUTURE) ×1 IMPLANT
SYS BAG RETRIEVAL 10MM (BASKET) ×1
SYSTEM BAG RETRIEVAL 10MM (BASKET) IMPLANT
TOWEL OR 17X26 10 PK STRL BLUE (TOWEL DISPOSABLE) ×1 IMPLANT
TOWEL OR NON WOVEN STRL DISP B (DISPOSABLE) IMPLANT
TRAY LAPAROSCOPIC (CUSTOM PROCEDURE TRAY) ×1 IMPLANT
TROCAR ADV FIXATION 12X100MM (TROCAR) IMPLANT
TROCAR BALLN 12MMX100 BLUNT (TROCAR) IMPLANT
TROCAR Z-THREAD OPTICAL 5X100M (TROCAR) ×1 IMPLANT

## 2022-06-17 NOTE — Anesthesia Preprocedure Evaluation (Addendum)
Anesthesia Evaluation  Patient identified by MRN, date of birth, ID band Patient awake    Reviewed: Allergy & Precautions, NPO status , Patient's Chart, lab work & pertinent test results  History of Anesthesia Complications Negative for: history of anesthetic complications  Airway Mallampati: III  TM Distance: >3 FB Neck ROM: Full    Dental no notable dental hx. (+) Dental Advisory Given   Pulmonary asthma , sleep apnea , COPD,  COPD inhaler, former smoker, PE   Pulmonary exam normal        Cardiovascular hypertension, Pt. on medications Normal cardiovascular exam  TTE 2021  1. Left ventricular ejection fraction, by estimation, is 65 to 70%. The  left ventricle has normal function. The left ventricle has no regional  wall motion abnormalities. There is mild left ventricular hypertrophy.  Left ventricular diastolic parameters  are consistent with Grade I diastolic dysfunction (impaired relaxation).   2. Right ventricular systolic function is normal. The right ventricular  size is normal. Tricuspid regurgitation signal is inadequate for assessing  PA pressure.   3. The mitral valve is grossly normal. No evidence of mitral valve  regurgitation.   4. The aortic valve is tricuspid. Aortic valve regurgitation is not  visualized.     Neuro/Psych  PSYCHIATRIC DISORDERS Anxiety     negative neurological ROS     GI/Hepatic Neg liver ROS,GERD  ,,  Endo/Other  diabetes, Type 2, Oral Hypoglycemic AgentsHypothyroidism    Renal/GU negative Renal ROS  negative genitourinary   Musculoskeletal negative musculoskeletal ROS (+)    Abdominal   Peds  Hematology  (+) Blood dyscrasia (eliquis)   Anesthesia Other Findings   Reproductive/Obstetrics                             Anesthesia Physical Anesthesia Plan  ASA: 3  Anesthesia Plan: General   Post-op Pain Management: Tylenol PO (pre-op)* and Toradol  IV (intra-op)*   Induction: Intravenous  PONV Risk Score and Plan: 4 or greater and Dexamethasone, Ondansetron, Treatment may vary due to age or medical condition and Diphenhydramine  Airway Management Planned: Oral ETT  Additional Equipment:   Intra-op Plan:   Post-operative Plan: Extubation in OR  Informed Consent: I have reviewed the patients History and Physical, chart, labs and discussed the procedure including the risks, benefits and alternatives for the proposed anesthesia with the patient or authorized representative who has indicated his/her understanding and acceptance.     Dental advisory given  Plan Discussed with: Anesthesiologist and CRNA  Anesthesia Plan Comments:        Anesthesia Quick Evaluation

## 2022-06-17 NOTE — Anesthesia Procedure Notes (Signed)
Procedure Name: Intubation Date/Time: 06/17/2022 1:52 PM  Performed by: [place], Clydene Pugh, CRNAPre-anesthesia Checklist: Patient identified, Emergency Drugs available, Suction available and Patient being monitored Patient Re-evaluated:Patient Re-evaluated prior to induction Oxygen Delivery Method: Circle system utilized Preoxygenation: Pre-oxygenation with 100% oxygen Induction Type: IV induction Ventilation: Mask ventilation without difficulty Laryngoscope Size: Mac, 4 and Glidescope Grade View: Grade IV Tube type: Oral Tube size: 7.0 mm Number of attempts: 3 Airway Equipment and Method: Stylet and Oral airway Placement Confirmation: ETT inserted through vocal cords under direct vision, positive ETCO2 and breath sounds checked- equal and bilateral Secured at: 22 cm Tube secured with: Tape Dental Injury: Teeth and Oropharynx as per pre-operative assessment  Difficulty Due To: Difficulty was unanticipated, Difficult Airway- due to anterior larynx and Difficult Airway- due to limited oral opening Future Recommendations: Recommend- induction with short-acting agent, and alternative techniques readily available Comments: DL x 1 with Mac 3 by CRNA, epiglottis only seen- 7.0ETT placed in esophagus; Dl x 1 by MDA with Miller 2- epiglottis view- ETT placed in Esophagus; DL with Mac 4 glidescope, grade 2 view, 7.0 ETT placed in trachea successfully, dentition same as preop and VSS throughout.

## 2022-06-17 NOTE — Anesthesia Postprocedure Evaluation (Signed)
Anesthesia Post Note  Patient: [name]  Procedure(s) Performed: LAPAROSCOPIC CHOLECYSTECTOMY     Patient location during evaluation: PACU Anesthesia Type: General Level of consciousness: awake and alert Pain management: pain level controlled Vital Signs Assessment: post-procedure vital signs reviewed and stable Respiratory status: spontaneous breathing, nonlabored ventilation and respiratory function stable Cardiovascular status: stable and blood pressure returned to baseline Anesthetic complications: yes   Encounter Notable Events  Notable Event Outcome Phase Comment  Difficult to intubate - unexpected  Intraprocedure Filed from anesthesia note documentation.    Last Vitals:  Vitals:   06/17/22 1615 06/17/22 1649  BP: (!) 154/69 (!) 179/80  Pulse: 69 66  Resp: 14 16  Temp:  36.8 C  SpO2: 92%     Last Pain:  Vitals:   06/17/22 1649  TempSrc: Oral  PainSc:                  [name]

## 2022-06-17 NOTE — Progress Notes (Signed)
Status post ERCP PROGRESS NOTE    [name]  BJY:[id number] DOB: 1940-03-14 DOA: 06/13/2022 PCP: [name], NP   Brief Narrative:  82 y.o. female with medical history significant of COPD, GERD, hypothyroidism, essential hypertension, hyperlipidemia, hyperlipidemia, asthma, history of pulmonary embolism on Eliquis who presented to med [facility] with chest and epigastric pain.  Found to have gallstone pancreatitis. S/p ERCP on 5/6.  General surgery following for possible laparoscopic cholecystectomy.    Assessment & Plan:  Principal Problem:   Acute pancreatitis Active Problems:   Hypothyroidism   Essential hypertension, benign   Mixed hyperlipidemia   COPD (chronic obstructive pulmonary disease) (HCC)   GERD without esophagitis   Pulmonary embolism (HCC)   OSA (obstructive sleep apnea)     Gallstone Pancreatitis  Choledocholithiasis  Cholelithiasis And initially lipase significantly elevated which is now trended down.  Status post ERCP 5/6.  General surgery following for possible laparoscopic cholecystectomy.  Pain control.   Atrial Tachycardia Echo with EF 50-55%, no RWMA, grade 1 diastolic dysfunction, mild dilation of LA and RA, IVC dilated with < 50% resp variability Zio patch upon discharge  Hypokalemia/hypomagnesemia - As needed repletion   Elevated BNP  Dilated IVC with <50% Respiratory Variability Some signs of volume overload, received as needed Lasix   Hypertension Continue losartan, metoprolol twice daily.  IV as needed ordered   Minimal Change Disease  CKD  Tacrolimus   COPD As needed bronchodilators  Hx PE Outpatient Eliquis currently on hold.   Hypothyroidism Synthroid   Dyslipidemia statin       DVT prophylaxis: Lovenox Code Status: full Family Communication: none Disposition:    Status is: Inpatient Remains inpatient appropriate because: Will need laparoscopic cholecystectomy.  Hopefully home in next 1-2  days postoperatively           Diet Orders (From admission, onward)     Start     Ordered   06/17/22 0001  Diet NPO time specified Except for: Sips with Meds  Diet effective midnight       Question:  Except for  Answer:  Clearance Coots with Meds   06/16/22 1025            Subjective:  Doing okay no complaints  Examination:  General exam: Appears calm and comfortable  Respiratory system: Clear to auscultation. Respiratory effort normal. Cardiovascular system: S1 & S2 heard, RRR. No JVD, murmurs, rubs, gallops or clicks. No pedal edema. Gastrointestinal system: Abdomen is nondistended, soft and nontender. No organomegaly or masses felt. Normal bowel sounds heard. Central nervous system: Alert and oriented. No focal neurological deficits. Extremities: Symmetric 5 x 5 power. Skin: No rashes, lesions or ulcers Psychiatry: Judgement and insight appear normal. Mood & affect appropriate.  Objective: Vitals:   06/16/22 1306 06/16/22 2100 06/17/22 0435 06/17/22 0604  BP: (!) 181/81 (!) 190/93 (!) 158/74   Pulse: 70 76 70   Resp: (!) 22 19 17    Temp: 97.9 F (36.6 C) 97.9 F (36.6 C) 98.5 F (36.9 C)   TempSrc: Oral Oral Oral   SpO2: 100% 100% 95%   Weight:    106.3 kg  Height:        Intake/Output Summary (Last 24 hours) at 06/17/2022 0748 Last data filed at 06/17/2022 0300 Gross per 24 hour  Intake 581.97 ml  Output 2350 ml  Net -1768.03 ml   Filed Weights   06/14/22 0229 06/16/22 0500 06/17/22 0604  Weight: 104.6 kg 105.6 kg 106.3 kg  Scheduled Meds:  citalopram  20 mg Oral Daily   donepezil  10 mg Oral QHS   enoxaparin (LOVENOX) injection  40 mg Subcutaneous Q24H   fluticasone furoate-vilanterol  1 puff Inhalation Daily   And   umeclidinium bromide  1 puff Inhalation Daily   hydrALAZINE  10 mg Oral Q8H   ipratropium  2 spray Each Nare BID   levothyroxine  88 mcg Oral Q0600   losartan  100 mg Oral Daily   metoprolol tartrate  25 mg Oral BID   montelukast  10  mg Oral QHS   pantoprazole  40 mg Oral Daily   rOPINIRole  1 mg Oral QHS   rosuvastatin  20 mg Oral Daily   tacrolimus  1 mg Oral BID   Continuous Infusions:  cefTRIAXone (ROCEPHIN)  IV Stopped (06/16/22 1408)    Nutritional status     Body mass index is 35.63 kg/m.  Data Reviewed:   CBC: Recent Labs  Lab 06/13/22 0504 06/13/22 1857 06/14/22 0417 06/15/22 0400 06/16/22 0401 06/17/22 0358  WBC 15.3* 11.6* 11.1* 11.1* 6.8 11.0*  NEUTROABS 12.1*  --   --  7.7 5.6 8.0*  HGB 13.0 12.2 12.3 12.3 11.1* 10.9*  HCT 40.7 40.2 41.0 40.6 37.2 36.2  MCV 86.4 90.3 91.5 91.4 90.7 90.0  PLT 296 262 269 242 221 241   Basic Metabolic Panel: Recent Labs  Lab 06/13/22 0409 06/13/22 0800 06/13/22 1857 06/14/22 0417 06/15/22 0400 06/16/22 0401 06/17/22 0358  NA 141  --   --  140 137 134* 136  K 3.6  --   --  4.4 4.3 4.8 3.3*  CL 103  --   --  104 102 99 98  CO2 28  --   --  29 28 28 29   GLUCOSE 151*  --   --  127* 102* 149* 135*  BUN 20  --   --  15 14 16 14   CREATININE 1.08*  --  0.90 0.90 0.92 0.91 0.91  CALCIUM 8.8*  --   --  8.0* 8.2* 8.3* 8.0*  MG  --  1.6*  --   --  1.8 1.9 1.5*  PHOS  --  3.6  --   --  3.8 4.6 3.2   GFR: Estimated Creatinine Clearance: 60.9 mL/min (by C-G formula based on SCr of 0.91 mg/dL). Liver Function Tests: Recent Labs  Lab 06/13/22 0800 06/14/22 0417 06/15/22 0400 06/16/22 0401 06/17/22 0358  AST 42* 23 18 17 20   ALT 23 22 19 23 23   ALKPHOS 38 39 43 32* 33*  BILITOT 0.5 0.5 0.8 0.5 0.7  PROT 6.5 6.9 7.3 6.3* 6.4*  ALBUMIN 3.4* 3.2* 3.2* 2.9* 3.1*   Recent Labs  Lab 06/13/22 0800 06/15/22 1015  LIPASE 3,094* 47   No results for input(s): "AMMONIA" in the last 168 hours. Coagulation Profile: No results for input(s): "INR", "PROTIME" in the last 168 hours. Cardiac Enzymes: No results for input(s): "CKTOTAL", "CKMB", "CKMBINDEX", "TROPONINI" in the last 168 hours. BNP (last 3 results) No results for input(s): "PROBNP" in the  last 8760 hours. HbA1C: No results for input(s): "HGBA1C" in the last 72 hours. CBG: No results for input(s): "GLUCAP" in the last 168 hours. Lipid Profile: No results for input(s): "CHOL", "HDL", "LDLCALC", "TRIG", "CHOLHDL", "LDLDIRECT" in the last 72 hours. Thyroid Function Tests: No results for input(s): "TSH", "T4TOTAL", "FREET4", "T3FREE", "THYROIDAB" in the last 72 hours. Anemia Panel: No results for input(s): "VITAMINB12", "FOLATE", "FERRITIN", "TIBC", "IRON", "RETICCTPCT"  in the last 72 hours. Sepsis Labs: No results for input(s): "PROCALCITON", "LATICACIDVEN" in the last 168 hours.  No results found for this or any previous visit (from the past 240 hour(s)).       Radiology Studies: DG CHEST PORT 1 VIEW  Result Date: 06/16/2022 CLINICAL DATA:  Elevated BNP EXAM: PORTABLE CHEST 1 VIEW COMPARISON:  Portable exam 1440 hours compared to 06/13/2022 FINDINGS: Enlargement of cardiac silhouette with slight pulmonary vascular congestion. Mediastinal contours normal. Subsegmental atelectasis RIGHT base. No definite pulmonary infiltrate or edema. No pleural effusion, pneumothorax or acute osseous findings. Diffuse osseous demineralization noted. IMPRESSION: Enlargement of cardiac silhouette with pulmonary vascular congestion. Subsegmental atelectasis RIGHT base. Electronically Signed   By: Ulyses Southward M.D.   On: 06/16/2022 15:05   ECHOCARDIOGRAM COMPLETE  Result Date: 06/15/2022    ECHOCARDIOGRAM REPORT   Patient Name:   [name] Date of Exam: 06/15/2022 Medical Rec #:  [id number]              Height:       68.0 in Accession #:    [id number]             Weight:       230.6 lb Date of Birth:  1940/10/25              BSA:          2.171 m Patient Age:    81 years               BP:           162/71 mmHg Patient Gender: F                      HR:           62 bpm. Exam Location:  Inpatient Procedure: 2D Echo, Cardiac Doppler and Color Doppler Indications:    Abnormal EKG  History:         Patient has prior history of Echocardiogram examinations, most                 recent 04/11/2019. COPD and Acute Pancreatitis; Risk                 Factors:Hypertension, Dyslipidemia, Sleep Apnea and Diabetes.  Sonographer:    NA Referring Phys: [id number] CALDWELL POWELL JR IMPRESSIONS  1. Left ventricular ejection fraction, by estimation, is 50 to 55%. The left ventricle has low normal function. The left ventricle has no regional wall motion abnormalities. There is mild concentric left ventricular hypertrophy. Left ventricular diastolic parameters are consistent with Grade I diastolic dysfunction (impaired relaxation).  2. Right ventricular systolic function is normal. The right ventricular size is normal. Tricuspid regurgitation signal is inadequate for assessing PA pressure.  3. Left atrial size was mildly dilated.  4. Right atrial size was mildly dilated.  5. The mitral valve is normal in structure. Trivial mitral valve regurgitation. No evidence of mitral stenosis.  6. The aortic valve is tricuspid. There is mild calcification of the aortic valve. Aortic valve regurgitation is not visualized. Mild aortic valve stenosis. Aortic valve area, by VTI measures 1.67 cm. Aortic valve mean gradient measures 9.0 mmHg.  7. The inferior vena cava is dilated in size with <50% respiratory variability, suggesting right atrial pressure of 15 mmHg. FINDINGS  Left Ventricle: Left ventricular ejection fraction, by estimation, is 50 to 55%. The left ventricle has low normal function. The left ventricle has no regional  wall motion abnormalities. The left ventricular internal cavity size was normal in size. There is mild concentric left ventricular hypertrophy. Left ventricular diastolic parameters are consistent with Grade I diastolic dysfunction (impaired relaxation). Right Ventricle: The right ventricular size is normal. No increase in right ventricular wall thickness. Right ventricular systolic function is normal. Tricuspid  regurgitation signal is inadequate for assessing PA pressure. Left Atrium: Left atrial size was mildly dilated. Right Atrium: Right atrial size was mildly dilated. Pericardium: There is no evidence of pericardial effusion. Mitral Valve: The mitral valve is normal in structure. There is mild calcification of the mitral valve leaflet(s). Mild mitral annular calcification. Trivial mitral valve regurgitation. No evidence of mitral valve stenosis. MV peak gradient, 5.7 mmHg. The mean mitral valve gradient is 3.0 mmHg. Tricuspid Valve: The tricuspid valve is normal in structure. Tricuspid valve regurgitation is not demonstrated. Aortic Valve: The aortic valve is tricuspid. There is mild calcification of the aortic valve. Aortic valve regurgitation is not visualized. Mild aortic stenosis is present. Aortic valve mean gradient measures 9.0 mmHg. Aortic valve peak gradient measures  15.2 mmHg. Aortic valve area, by VTI measures 1.67 cm. Pulmonic Valve: The pulmonic valve was normal in structure. Pulmonic valve regurgitation is not visualized. Aorta: The aortic root is normal in size and structure. Venous: The inferior vena cava is dilated in size with less than 50% respiratory variability, suggesting right atrial pressure of 15 mmHg. IAS/Shunts: No atrial level shunt detected by color flow Doppler.  LEFT VENTRICLE PLAX 2D LVIDd:         5.20 cm     Diastology LVIDs:         3.60 cm     LV e' medial:    8.05 cm/s LV PW:         1.20 cm     LV E/e' medial:  12.3 LV IVS:        1.20 cm     LV e' lateral:   8.59 cm/s LVOT diam:     1.90 cm     LV E/e' lateral: 11.6 LV SV:         79 LV SV Index:   36 LVOT Area:     2.84 cm  LV Volumes (MOD) LV vol d, MOD A2C: 91.6 ml LV vol d, MOD A4C: 91.5 ml LV vol s, MOD A2C: 42.0 ml LV vol s, MOD A4C: 37.7 ml LV SV MOD A2C:     49.6 ml LV SV MOD A4C:     91.5 ml LV SV MOD BP:      52.5 ml RIGHT VENTRICLE             IVC RV Basal diam:  3.60 cm     IVC diam: 2.70 cm RV S prime:     15.00  cm/s TAPSE (M-mode): 3.0 cm LEFT ATRIUM             Index        RIGHT ATRIUM           Index LA diam:        3.80 cm 1.75 cm/m   RA Area:     21.30 cm LA Vol (A2C):   64.2 ml 29.57 ml/m  RA Volume:   61.70 ml  28.42 ml/m LA Vol (A4C):   69.3 ml 31.92 ml/m LA Biplane Vol: 68.2 ml 31.41 ml/m  AORTIC VALVE AV Area (Vmax):    1.68 cm AV Area (Vmean):   1.67 cm AV  Area (VTI):     1.67 cm AV Vmax:           195.00 cm/s AV Vmean:          140.000 cm/s AV VTI:            0.471 m AV Peak Grad:      15.2 mmHg AV Mean Grad:      9.0 mmHg LVOT Vmax:         115.50 cm/s LVOT Vmean:        82.400 cm/s LVOT VTI:          0.277 m LVOT/AV VTI ratio: 0.59  AORTA Ao Root diam: 2.80 cm Ao Asc diam:  3.40 cm MITRAL VALVE MV Area (PHT): 3.45 cm     SHUNTS MV Area VTI:   1.86 cm     Systemic VTI:  0.28 m MV Peak grad:  5.7 mmHg     Systemic Diam: 1.90 cm MV Mean grad:  3.0 mmHg MV Vmax:       1.19 m/s MV Vmean:      72.8 cm/s MV Decel Time: 220 msec MV E velocity: 99.30 cm/s MV A velocity: 115.00 cm/s MV E/A ratio:  0.86 Dalton McleanMD Electronically signed by Wilfred Lacy Signature Date/Time: 06/15/2022/4:03:24 PM    Final    DG ERCP  Result Date: 06/15/2022 CLINICAL DATA:  Pancreatitis.  Concern for choledocholithiasis. EXAM: ERCP TECHNIQUE: Multiple spot images obtained with the fluoroscopic device and submitted for interpretation post-procedure. FLUOROSCOPY TIME: FLUOROSCOPY TIME 2.2 mGy COMPARISON:  CT abdomen and pelvis 06/13/2022 FINDINGS: Five spot intraoperative fluoroscopic images of the right upper abdominal quadrant during ERCP are provided for review Initial image demonstrates an ERCP probe overlying the right upper abdominal quadrant. There is selective cannulation and faint opacification of the common bile duct which appears nondilated Subsequent images demonstrate insufflation of a balloon within the central aspect of the CBD with subsequent biliary sweeping and presumed sphincterotomy. There is faint  opacification of the cystic duct. There is minimal opacification of the intrahepatic biliary tree which appears nondilated. There is no definitive opacification of the pancreatic duct. IMPRESSION: ERCP with biliary sweeping and presumed sphincterotomy as above. These images were submitted for radiologic interpretation only. Please see the procedural report for the amount of contrast and the fluoroscopy time utilized. Electronically Signed   By: Simonne Come M.D.   On: 06/15/2022 15:36           LOS: 4 days   Time spent= 35 mins    Markon Jares Joline Maxcy, MD [facility]  If 7PM-7AM, please contact night-coverage  06/17/2022, 7:48 AM

## 2022-06-17 NOTE — Care Management Important Message (Signed)
Important Message  Patient Details IM Letter given to the Patient. Name: Dorea Berrigan MRN: [id number] Date of Birth: 05-18-1940   Medicare Important Message Given:  Yes     Caren Macadam 06/17/2022, 10:35 AM

## 2022-06-17 NOTE — Progress Notes (Signed)
[facility] Progress Note  2 Days Post-Op  Subjective: CC:  States her pain is better. Does report some discomfort after solid food yesterday. Denies nausea or vomiting. +flatus  Objective: Vital signs in last 24 hours: Temp:  [97.9 F (36.6 C)-98.5 F (36.9 C)] 98.5 F (36.9 C) (05/08 0435) Pulse Rate:  [70-76] 70 (05/08 0435) Resp:  [17-22] 17 (05/08 0435) BP: (158-190)/(74-93) 158/74 (05/08 0435) SpO2:  [95 %-100 %] 95 % (05/08 0756) Weight:  [106.3 kg] 106.3 kg (05/08 0604) Last BM Date : 06/12/22  Intake/Output from previous day: 05/07 0701 - 05/08 0700 In: 582 [P.O.:480; IV Piggyback:102] Out: 2350 [Urine:2350] Intake/Output this shift: No intake/output data recorded.  PE: Gen:  Alert, NAD, pleasant Card:  Regular rate and rhythm, pedal pulses 2+ BL Pulm:  Normal effort, clear to auscultation bilaterally Abd: Soft, overall nontender today, much less tender compared to yesterday. Less distention Skin: warm and dry, no rashes  Psych: A&Ox3   Lab Results:  Recent Labs    06/16/22 0401 06/17/22 0358  WBC 6.8 11.0*  HGB 11.1* 10.9*  HCT 37.2 36.2  PLT 221 241   BMET Recent Labs    06/16/22 0401 06/17/22 0358  NA 134* 136  K 4.8 3.3*  CL 99 98  CO2 28 29  GLUCOSE 149* 135*  BUN 16 14  CREATININE 0.91 0.91  CALCIUM 8.3* 8.0*   PT/INR No results for input(s): "LABPROT", "INR" in the last 72 hours. CMP     Component Value Date/Time   NA 136 06/17/2022 0358   K 3.3 (L) 06/17/2022 0358   CL 98 06/17/2022 0358   CO2 29 06/17/2022 0358   GLUCOSE 135 (H) 06/17/2022 0358   BUN 14 06/17/2022 0358   CREATININE 0.91 06/17/2022 0358   CREATININE 0.98 (H) 01/19/2022 0851   CALCIUM 8.0 (L) 06/17/2022 0358   PROT 6.4 (L) 06/17/2022 0358   ALBUMIN 3.1 (L) 06/17/2022 0358   AST 20 06/17/2022 0358   ALT 23 06/17/2022 0358   ALKPHOS 33 (L) 06/17/2022 0358   BILITOT 0.7 06/17/2022 0358   GFRNONAA >60 06/17/2022 0358   GFRNONAA 53 (L) 06/12/2013  0823   GFRAA 52 (L) 02/12/2019 0501   GFRAA 61 06/12/2013 0823   Lipase     Component Value Date/Time   LIPASE 47 06/15/2022 1015       Studies/Results: DG CHEST PORT 1 VIEW  Result Date: 06/16/2022 CLINICAL DATA:  Elevated BNP EXAM: PORTABLE CHEST 1 VIEW COMPARISON:  Portable exam 1440 hours compared to 06/13/2022 FINDINGS: Enlargement of cardiac silhouette with slight pulmonary vascular congestion. Mediastinal contours normal. Subsegmental atelectasis RIGHT base. No definite pulmonary infiltrate or edema. No pleural effusion, pneumothorax or acute osseous findings. Diffuse osseous demineralization noted. IMPRESSION: Enlargement of cardiac silhouette with pulmonary vascular congestion. Subsegmental atelectasis RIGHT base. Electronically Signed   By: Ulyses Southward M.D.   On: 06/16/2022 15:05   ECHOCARDIOGRAM COMPLETE  Result Date: 06/15/2022    ECHOCARDIOGRAM REPORT   Patient Name:   [name] Date of Exam: 06/15/2022 Medical Rec #:  [id number]              Height:       68.0 in Accession #:    [id number]             Weight:       230.6 lb Date of Birth:  [birth year]              BSA:  2.171 m Patient Age:    81 years               BP:           162/71 mmHg Patient Gender: F                      HR:           62 bpm. Exam Location:  Inpatient Procedure: 2D Echo, Cardiac Doppler and Color Doppler Indications:    Abnormal EKG  History:        Patient has prior history of Echocardiogram examinations, most                 recent 04/11/2019. COPD and Acute Pancreatitis; Risk                 Factors:Hypertension, Dyslipidemia, Sleep Apnea and Diabetes.  Sonographer:    NA Referring Phys: [id number] CALDWELL POWELL JR IMPRESSIONS  1. Left ventricular ejection fraction, by estimation, is 50 to 55%. The left ventricle has low normal function. The left ventricle has no regional wall motion abnormalities. There is mild concentric left ventricular hypertrophy. Left ventricular diastolic parameters  are consistent with Grade I diastolic dysfunction (impaired relaxation).  2. Right ventricular systolic function is normal. The right ventricular size is normal. Tricuspid regurgitation signal is inadequate for assessing PA pressure.  3. Left atrial size was mildly dilated.  4. Right atrial size was mildly dilated.  5. The mitral valve is normal in structure. Trivial mitral valve regurgitation. No evidence of mitral stenosis.  6. The aortic valve is tricuspid. There is mild calcification of the aortic valve. Aortic valve regurgitation is not visualized. Mild aortic valve stenosis. Aortic valve area, by VTI measures 1.67 cm. Aortic valve mean gradient measures 9.0 mmHg.  7. The inferior vena cava is dilated in size with <50% respiratory variability, suggesting right atrial pressure of 15 mmHg. FINDINGS  Left Ventricle: Left ventricular ejection fraction, by estimation, is 50 to 55%. The left ventricle has low normal function. The left ventricle has no regional wall motion abnormalities. The left ventricular internal cavity size was normal in size. There is mild concentric left ventricular hypertrophy. Left ventricular diastolic parameters are consistent with Grade I diastolic dysfunction (impaired relaxation). Right Ventricle: The right ventricular size is normal. No increase in right ventricular wall thickness. Right ventricular systolic function is normal. Tricuspid regurgitation signal is inadequate for assessing PA pressure. Left Atrium: Left atrial size was mildly dilated. Right Atrium: Right atrial size was mildly dilated. Pericardium: There is no evidence of pericardial effusion. Mitral Valve: The mitral valve is normal in structure. There is mild calcification of the mitral valve leaflet(s). Mild mitral annular calcification. Trivial mitral valve regurgitation. No evidence of mitral valve stenosis. MV peak gradient, 5.7 mmHg. The mean mitral valve gradient is 3.0 mmHg. Tricuspid Valve: The tricuspid valve is  normal in structure. Tricuspid valve regurgitation is not demonstrated. Aortic Valve: The aortic valve is tricuspid. There is mild calcification of the aortic valve. Aortic valve regurgitation is not visualized. Mild aortic stenosis is present. Aortic valve mean gradient measures 9.0 mmHg. Aortic valve peak gradient measures  15.2 mmHg. Aortic valve area, by VTI measures 1.67 cm. Pulmonic Valve: The pulmonic valve was normal in structure. Pulmonic valve regurgitation is not visualized. Aorta: The aortic root is normal in size and structure. Venous: The inferior vena cava is dilated in size with less than 50% respiratory variability, suggesting right  atrial pressure of 15 mmHg. IAS/Shunts: No atrial level shunt detected by color flow Doppler.  LEFT VENTRICLE PLAX 2D LVIDd:         5.20 cm     Diastology LVIDs:         3.60 cm     LV e' medial:    8.05 cm/s LV PW:         1.20 cm     LV E/e' medial:  12.3 LV IVS:        1.20 cm     LV e' lateral:   8.59 cm/s LVOT diam:     1.90 cm     LV E/e' lateral: 11.6 LV SV:         79 LV SV Index:   36 LVOT Area:     2.84 cm  LV Volumes (MOD) LV vol d, MOD A2C: 91.6 ml LV vol d, MOD A4C: 91.5 ml LV vol s, MOD A2C: 42.0 ml LV vol s, MOD A4C: 37.7 ml LV SV MOD A2C:     49.6 ml LV SV MOD A4C:     91.5 ml LV SV MOD BP:      52.5 ml RIGHT VENTRICLE             IVC RV Basal diam:  3.60 cm     IVC diam: 2.70 cm RV S prime:     15.00 cm/s TAPSE (M-mode): 3.0 cm LEFT ATRIUM             Index        RIGHT ATRIUM           Index LA diam:        3.80 cm 1.75 cm/m   RA Area:     21.30 cm LA Vol (A2C):   64.2 ml 29.57 ml/m  RA Volume:   61.70 ml  28.42 ml/m LA Vol (A4C):   69.3 ml 31.92 ml/m LA Biplane Vol: 68.2 ml 31.41 ml/m  AORTIC VALVE AV Area (Vmax):    1.68 cm AV Area (Vmean):   1.67 cm AV Area (VTI):     1.67 cm AV Vmax:           195.00 cm/s AV Vmean:          140.000 cm/s AV VTI:            0.471 m AV Peak Grad:      15.2 mmHg AV Mean Grad:      9.0 mmHg LVOT Vmax:          115.50 cm/s LVOT Vmean:        82.400 cm/s LVOT VTI:          0.277 m LVOT/AV VTI ratio: 0.59  AORTA Ao Root diam: 2.80 cm Ao Asc diam:  3.40 cm MITRAL VALVE MV Area (PHT): 3.45 cm     SHUNTS MV Area VTI:   1.86 cm     Systemic VTI:  0.28 m MV Peak grad:  5.7 mmHg     Systemic Diam: 1.90 cm MV Mean grad:  3.0 mmHg MV Vmax:       1.19 m/s MV Vmean:      72.8 cm/s MV Decel Time: 220 msec MV E velocity: 99.30 cm/s MV A velocity: 115.00 cm/s MV E/A ratio:  0.86 Dalton McleanMD Electronically signed by Wilfred Lacy Signature Date/Time: 06/15/2022/4:03:24 PM    Final    DG ERCP  Result Date: 06/15/2022 CLINICAL DATA:  Pancreatitis.  Concern for choledocholithiasis. EXAM: ERCP TECHNIQUE: Multiple spot images obtained with the fluoroscopic device and submitted for interpretation post-procedure. FLUOROSCOPY TIME: FLUOROSCOPY TIME 2.2 mGy COMPARISON:  CT abdomen and pelvis 06/13/2022 FINDINGS: Five spot intraoperative fluoroscopic images of the right upper abdominal quadrant during ERCP are provided for review Initial image demonstrates an ERCP probe overlying the right upper abdominal quadrant. There is selective cannulation and faint opacification of the common bile duct which appears nondilated Subsequent images demonstrate insufflation of a balloon within the central aspect of the CBD with subsequent biliary sweeping and presumed sphincterotomy. There is faint opacification of the cystic duct. There is minimal opacification of the intrahepatic biliary tree which appears nondilated. There is no definitive opacification of the pancreatic duct. IMPRESSION: ERCP with biliary sweeping and presumed sphincterotomy as above. These images were submitted for radiologic interpretation only. Please see the procedural report for the amount of contrast and the fluoroscopy time utilized. Electronically Signed   By: Simonne Come M.D.   On: 06/15/2022 15:36    Anti-infectives: Anti-infectives (From admission, onward)    Start      Dose/Rate Route Frequency Ordered Stop   06/16/22 1115  cefTRIAXone (ROCEPHIN) 2 g in sodium chloride 0.9 % 100 mL IVPB        2 g 200 mL/hr over 30 Minutes Intravenous Every 24 hours 06/16/22 1022        Assessment/Plan  Gallstone pancreatitis, choledocholithiasis, possible cholecystitis  - s/p ERCP sphincterotomy 5/6 Dr. Ewing Schlein  - afebrile, VSS, WBC 6.8 from 11, LFTs remain unremarkable - CT 5/4 with significant pancreatitis without pseudocyst or fluid collection.  - pain improving, clinically pancreatitis is resolving. Recommending proceeding with lap chole today. Discussed the risks of surgery including bleeding, infection, conversion to open, damage to surrounding structures, need for additional procedures/surgeries, prolonged hospital stay. Patient would like to proceed with surgery.  FEN: SOFT, NPO MN ID: Rocephin  VTE: SCD's, lovenox  Foley: none Dispo: medsurg, TRH, lap chole this admission.    LOS: 4 days   I reviewed nursing notes, hospitalist notes, last 24 h vitals and pain scores, last 48 h intake and output, last 24 h labs and trends, and last 24 h imaging results.  This care required moderate level of medical decision making.   Hosie Spangle, PA-C [facility] Please see Amion for pager number during day hours 7:00am-4:30pm

## 2022-06-17 NOTE — Transfer of Care (Signed)
Immediate Anesthesia Transfer of Care Note  Patient: [name]  Procedure(s) Performed: LAPAROSCOPIC CHOLECYSTECTOMY  Patient Location: PACU  Anesthesia Type:General  Level of Consciousness: drowsy and patient cooperative  Airway & Oxygen Therapy: Patient Spontanous Breathing and Patient connected to face mask oxygen  Post-op Assessment: Report given to RN and Post -op Vital signs reviewed and stable  Post vital signs: Reviewed and stable  Last Vitals:  Vitals Value Taken Time  BP 176/79 06/17/22 1518  Temp    Pulse 66 06/17/22 1521  Resp 22 06/17/22 1521  SpO2 97 % 06/17/22 1521  Vitals shown include unvalidated device data.  Last Pain:  Vitals:   06/17/22 1239  TempSrc: Oral  PainSc: 0-No pain      Patients Stated Pain Goal: 4 (06/17/22 1239)  Complications:  Encounter Notable Events  Notable Event Outcome Phase Comment  Difficult to intubate - unexpected  Intraprocedure Filed from anesthesia note documentation.

## 2022-06-17 NOTE — Op Note (Signed)
Date: 06/17/22  Patient: [name] MRN: [id number]  Preoperative Diagnosis: Biliary pancreatitis Postoperative Diagnosis: Same  Procedure: Laparoscopic cholecystectomy  Surgeon: Sophronia Simas, MD Assistant: Hosie Spangle, PA-C  EBL: 50 mL  Anesthesia: General endotracheal  Specimens: Gallbladder  Indications: [name] is an 82 yo female who presented with acute abdominal pain and was found to have choledocholithiasis with acute pancreatitis. She underwent an ERCP with sphincterotomy and stone extraction two days. Abdominal pain has significantly improved. After a discussion of the risks and benefits of surgery, she agreed to proceed with cholecystectomy.  Findings: Cholelithiasis without acute cholecystitis. Benign hepatic cysts.  Procedure details: Informed consent was obtained in the preoperative area prior to the procedure. The patient was brought to the operating room and placed on the table in the supine position. General anesthesia was induced and appropriate lines and drains were placed for intraoperative monitoring. Perioperative antibiotics were administered per SCIP guidelines. The abdomen was prepped and draped in the usual sterile fashion. A pre-procedure timeout was taken verifying patient identity, surgical site and procedure to be performed.  A small infraumbilical skin incision was made, the subcutaneous tissue was bluntly spread, and the umbilical stalk was grasped and elevated. A Veress needle was inserted through the fascia, intraperitoneal placement was confirmed with the saline drop test, and the abdomen was insufflated. A 12mm port was placed and the peritoneal cavity was inspected with no evidence of visceral or vascular injury. Three 5mm ports were placed in the right subcostal margin, all under direct visualization. The fundus of the gallbladder was grasped and retracted cephalad. There was a small simple cyst adjacent to the fundus of the  gallbladder, which drained serous fluid. There were minimal omental adhesions to the fundus of the gallbladder, which were taken down with cautery. The infundibulum was retracted laterally, and the peritoneum overlying the gallbladder was incised with cautery. The cystic triangle was dissected out using cautery and blunt dissection, and the critical view of safety was obtained. The cystic duct and cystic artery were clipped and ligated. There was a tear in the cystic duct, and the clips were placed proximal to this tear. The clips did not completely cross the cystic duct, so the cystic duct stump was also ligated with a PDS endoloop. The gallbladder was taken off the liver using cautery, and the specimen was placed in an endocatch bag and removed via the umbilical port site. There was bleeding from a small posterior branch of the cystic artery in the middle of the gallbladder fossa, which was controlled with placement of a clip. Hemostasis was achieved in the remainder of the gallbladder fossa with cautery. The surgical site was irrigated with saline until the effluent was clear. There were several small dropped stones, all of which were extracted. The cystic duct and artery stumps were visually inspected and there was no evidence of bile leak or bleeding. The umbilical port was removed and the fascia at this site was closed with a 0 Vicryl figure-of-eight suture using a PMI. The remaining ports were removed under direct visualization and the abdomen was desufflated. The skin at all port sites was closed with 4-0 monocryl subcuticular suture. Dermabond was applied.  The patient tolerated the procedure well with no apparent complications. All counts were correct x2 at the end of the procedure. The patient was extubated and taken to PACU in stable condition.  Sophronia Simas, MD 06/17/22 3:15 PM

## 2022-06-18 ENCOUNTER — Encounter (HOSPITAL_COMMUNITY): Payer: Self-pay | Admitting: Surgery

## 2022-06-18 ENCOUNTER — Inpatient Hospital Stay (INDEPENDENT_AMBULATORY_CARE_PROVIDER_SITE_OTHER): Payer: Medicare PPO

## 2022-06-18 ENCOUNTER — Other Ambulatory Visit: Payer: Self-pay | Admitting: Physician Assistant

## 2022-06-18 DIAGNOSIS — I4719 Other supraventricular tachycardia: Secondary | ICD-10-CM

## 2022-06-18 DIAGNOSIS — K8500 Idiopathic acute pancreatitis without necrosis or infection: Secondary | ICD-10-CM | POA: Diagnosis not present

## 2022-06-18 LAB — CBC
HCT: 36.8 % (ref 36.0–46.0)
Hemoglobin: 11 g/dL — ABNORMAL LOW (ref 12.0–15.0)
MCH: 27.4 pg (ref 26.0–34.0)
MCHC: 29.9 g/dL — ABNORMAL LOW (ref 30.0–36.0)
MCV: 91.5 fL (ref 80.0–100.0)
Platelets: 235 10*3/uL (ref 150–400)
RBC: 4.02 MIL/uL (ref 3.87–5.11)
RDW: 15.7 % — ABNORMAL HIGH (ref 11.5–15.5)
WBC: 11.3 10*3/uL — ABNORMAL HIGH (ref 4.0–10.5)
nRBC: 0 % (ref 0.0–0.2)

## 2022-06-18 LAB — COMPREHENSIVE METABOLIC PANEL
ALT: 38 U/L (ref 0–44)
AST: 46 U/L — ABNORMAL HIGH (ref 15–41)
Albumin: 3 g/dL — ABNORMAL LOW (ref 3.5–5.0)
Alkaline Phosphatase: 35 U/L — ABNORMAL LOW (ref 38–126)
Anion gap: 6 (ref 5–15)
BUN: 17 mg/dL (ref 8–23)
CO2: 33 mmol/L — ABNORMAL HIGH (ref 22–32)
Calcium: 8 mg/dL — ABNORMAL LOW (ref 8.9–10.3)
Chloride: 99 mmol/L (ref 98–111)
Creatinine, Ser: 1.04 mg/dL — ABNORMAL HIGH (ref 0.44–1.00)
GFR, Estimated: 54 mL/min — ABNORMAL LOW (ref >60)
Glucose, Bld: 154 mg/dL — ABNORMAL HIGH (ref 70–99)
Potassium: 4.8 mmol/L (ref 3.5–5.1)
Sodium: 138 mmol/L (ref 135–145)
Total Bilirubin: 0.4 mg/dL (ref 0.3–1.2)
Total Protein: 6.6 g/dL (ref 6.5–8.1)

## 2022-06-18 LAB — GLUCOSE, CAPILLARY: Glucose-Capillary: 107 mg/dL — ABNORMAL HIGH (ref 70–99)

## 2022-06-18 LAB — MAGNESIUM: Magnesium: 2.1 mg/dL (ref 1.7–2.4)

## 2022-06-18 MED ORDER — ACETAMINOPHEN 500 MG PO TABS
1000.0000 mg | ORAL_TABLET | Freq: Four times a day (QID) | ORAL | Status: DC
  Administered 2022-06-18 – 2022-06-21 (×10): 1000 mg via ORAL
  Filled 2022-06-18 (×11): qty 2

## 2022-06-18 MED ORDER — FERROUS SULFATE 325 (65 FE) MG PO TABS
325.0000 mg | ORAL_TABLET | Freq: Every day | ORAL | Status: DC
  Administered 2022-06-19 – 2022-06-21 (×3): 325 mg via ORAL
  Filled 2022-06-18 (×3): qty 1

## 2022-06-18 MED ORDER — APIXABAN 5 MG PO TABS
5.0000 mg | ORAL_TABLET | Freq: Two times a day (BID) | ORAL | Status: DC
  Administered 2022-06-18 – 2022-06-21 (×7): 5 mg via ORAL
  Filled 2022-06-18 (×7): qty 1

## 2022-06-18 MED ORDER — ACETAMINOPHEN 500 MG PO TABS: 1000.0000 mg | ORAL_TABLET | Freq: Four times a day (QID) | ORAL | 0 refills | Status: DC | PRN

## 2022-06-18 MED ORDER — OXYCODONE HCL 5 MG PO TABS: 5.0000 mg | ORAL_TABLET | ORAL | 0 refills | Status: DC | PRN

## 2022-06-18 NOTE — Plan of Care (Signed)
  Problem: Clinical Measurements: Goal: Diagnostic test results will improve Outcome: Progressing Goal: Respiratory complications will improve Outcome: Progressing   Problem: Activity: Goal: Risk for activity intolerance will decrease Outcome: Progressing   Problem: Nutrition: Goal: Adequate nutrition will be maintained Outcome: Progressing   

## 2022-06-18 NOTE — Discharge Instructions (Signed)

## 2022-06-18 NOTE — Progress Notes (Signed)
Zio XT ordered as recommended by Dr. Nelson Chimes. Dr. Rennis Golden to read. Staff message sent to scheduler to arrange 2-3 month new patient visit with one of the cardiologist to review monitor report.

## 2022-06-18 NOTE — Progress Notes (Signed)
[facility] Progress Note  1 Day Post-Op  Subjective: Pain as expected with movement post op. Tolerating CLD.    Objective: Vital signs in last 24 hours: Temp:  [97.8 F (36.6 C)-98.6 F (37 C)] 98.5 F (36.9 C) (05/09 0858) Pulse Rate:  [60-69] 65 (05/09 0858) Resp:  [12-24] 17 (05/09 0858) BP: (136-187)/(61-85) 169/64 (05/09 0858) SpO2:  [85 %-98 %] 97 % (05/09 0858) Weight:  [102.9 kg] 102.9 kg (05/09 0541) Last BM Date : 06/16/22  Intake/Output from previous day: 05/08 0701 - 05/09 0700 In: 1485 [P.O.:285; I.V.:1200] Out: 620 [Urine:600; Blood:20] Intake/Output this shift: Total I/O In: -  Out: 50 [Urine:50]  PE: Abd: Soft, appropriately tender as expected, incisions c/d/I, ND   Lab Results:  Recent Labs    06/17/22 0358 06/18/22 0416  WBC 11.0* 11.3*  HGB 10.9* 11.0*  HCT 36.2 36.8  PLT 241 235   BMET Recent Labs    06/17/22 0358 06/18/22 0416  NA 136 138  K 3.3* 4.8  CL 98 99  CO2 29 33*  GLUCOSE 135* 154*  BUN 14 17  CREATININE 0.91 1.04*  CALCIUM 8.0* 8.0*   PT/INR No results for input(s): "LABPROT", "INR" in the last 72 hours. CMP     Component Value Date/Time   NA 138 06/18/2022 0416   K 4.8 06/18/2022 0416   CL 99 06/18/2022 0416   CO2 33 (H) 06/18/2022 0416   GLUCOSE 154 (H) 06/18/2022 0416   BUN 17 06/18/2022 0416   CREATININE 1.04 (H) 06/18/2022 0416   CREATININE 0.98 (H) 01/19/2022 0851   CALCIUM 8.0 (L) 06/18/2022 0416   PROT 6.6 06/18/2022 0416   ALBUMIN 3.0 (L) 06/18/2022 0416   AST 46 (H) 06/18/2022 0416   ALT 38 06/18/2022 0416   ALKPHOS 35 (L) 06/18/2022 0416   BILITOT 0.4 06/18/2022 0416   GFRNONAA 54 (L) 06/18/2022 0416   GFRNONAA 53 (L) 06/12/2013 0823   GFRAA 52 (L) 02/12/2019 0501   GFRAA 61 06/12/2013 0823   Lipase     Component Value Date/Time   LIPASE 47 06/15/2022 1015       Studies/Results: DG CHEST PORT 1 VIEW  Result Date: 06/16/2022 CLINICAL DATA:  Elevated BNP EXAM: PORTABLE  CHEST 1 VIEW COMPARISON:  Portable exam 1440 hours compared to 06/13/2022 FINDINGS: Enlargement of cardiac silhouette with slight pulmonary vascular congestion. Mediastinal contours normal. Subsegmental atelectasis RIGHT base. No definite pulmonary infiltrate or edema. No pleural effusion, pneumothorax or acute osseous findings. Diffuse osseous demineralization noted. IMPRESSION: Enlargement of cardiac silhouette with pulmonary vascular congestion. Subsegmental atelectasis RIGHT base. Electronically Signed   By: Ulyses Southward M.D.   On: 06/16/2022 15:05    Anti-infectives: Anti-infectives (From admission, onward)    Start     Dose/Rate Route Frequency Ordered Stop   06/16/22 1115  cefTRIAXone (ROCEPHIN) 2 g in sodium chloride 0.9 % 100 mL IVPB  Status:  Discontinued        2 g 200 mL/hr over 30 Minutes Intravenous Every 24 hours 06/16/22 1022 06/17/22 1647        Assessment/Plan  POD 1, s/p lap chole by Dr. Freida Busman on 5/8 for Gallstone pancreatitis, choledocholithiasis, possible cholecystitis  - s/p ERCP sphincterotomy 5/6 Dr. Ewing Schlein  - having some pain with movement as expected post operatively. -tolerating CLD, adv to soft diet -getting ready to take oxy for pain. -if tolerating her diet and pain well controlled, she is surgically stable for DC home -d/w primary MD  FEN: SOFT ID: Rocephin, preop  VTE: SCD's, eliquis    LOS: 5 days   Letha Cape, [facility] Please see Amion for pager number during day hours 7:00am-4:30pm

## 2022-06-18 NOTE — Evaluation (Signed)
Physical Therapy Evaluation Patient Details Name: [name] MRN: [id number] DOB: August 10, 1940 Today's Date: 06/18/2022  History of Present Illness  82 y.o. female found to have gallstone pancreatitis. Pt is s/p ERCP on 5/6 and s/p lap chole 5/8. PMH: COPD, GERD, hypothyroidism, essential hypertension, hyperlipidemia, hyperlipidemia, asthma, history of pulmonary embolism on Eliquis  Clinical Impression  Pt admitted with above diagnosis. Pt from home, lives with daughter who works, 2 steps to enter the home with handrails, pt has DME but isn't currently using it, ind with amb and self care, drives. Educated pt on log rolling and avoiding breath holding to come to sitting EOB, supv to complete. Pt powers up to stand, again educated on avoiding breath holding, BUE assisting to power up with RW. Pt amb 30 ft with RW, min guard, pt on 3L with SpO2 90% pre and post amb, unable to obtain during due to finger probe falling off. Pt reports 6/10 pain consistently throughout eval. Educated pt on amb as able with nursing and OOB activity; anticipate no f/u PT needs at d/c. Pt currently with functional limitations due to the deficits listed below (see PT Problem List). Pt will benefit from acute skilled PT to increase their independence and safety with mobility to allow discharge.          Recommendations for follow up therapy are one component of a multi-disciplinary discharge planning process, led by the attending physician.  Recommendations may be updated based on patient status, additional functional criteria and insurance authorization.  Follow Up Recommendations       Assistance Recommended at Discharge Set up Supervision/Assistance  Patient can return home with the following  A little help with bathing/dressing/bathroom;Assistance with cooking/housework;Assist for transportation;Help with stairs or ramp for entrance    Equipment Recommendations None recommended by PT  Recommendations for  Other Services       Functional Status Assessment Patient has had a recent decline in their functional status and demonstrates the ability to make significant improvements in function in a reasonable and predictable amount of time.     Precautions / Restrictions Precautions Precautions: Fall Precaution Comments: monitor SpO2 Restrictions Weight Bearing Restrictions: No      Mobility  Bed Mobility Overal bed mobility: Needs Assistance Bed Mobility: Supine to Sit  Supine to sit: Supervision  General bed mobility comments: verbal cues for log rolling to protect abdomen, able to perform with supv    Transfers Overall transfer level: Needs assistance Equipment used: Rolling walker (2 wheels) Transfers: Sit to/from Stand, Bed to chair/wheelchair/BSC Sit to Stand: Supervision  Step pivot transfers: Supervision  General transfer comment: powers to stand with BUE assisting, cues to avoid breath holding with powering up to stand    Ambulation/Gait Ambulation/Gait assistance: Min guard Gait Distance (Feet): 30 Feet Assistive device: Rolling walker (2 wheels) Gait Pattern/deviations: Step-through pattern, Decreased stride length, Trunk flexed Gait velocity: decreased  General Gait Details: step through gait pattern with trunk slightly flexed, decreased cadence, no overt LOB, cues for pursed lip breathing  Stairs            Wheelchair Mobility    Modified Rankin (Stroke Patients Only)       Balance Overall balance assessment: Needs assistance Sitting-balance support: Feet supported Sitting balance-Leahy Scale: Good  Standing balance support: Reliant on assistive device for balance, During functional activity, Bilateral upper extremity supported Standing balance-Leahy Scale: Poor       Pertinent Vitals/Pain Pain Assessment Pain Assessment: 0-10 Pain Score: 6  Pain  Location: surgical site abdomen Pain Descriptors / Indicators: Sore, Grimacing Pain Intervention(s):  Limited activity within patient's tolerance, Monitored during session, Premedicated before session, Repositioned    Home Living Family/patient expects to be discharged to:: Private residence Living Arrangements: Children Available Help at Discharge: Family;Available PRN/intermittently Type of Home: House Home Access: Stairs to enter Entrance Stairs-Rails: Right;Left Entrance Stairs-Number of Steps: 2   Home Layout: Two level;Able to live on main level with bedroom/bathroom Home Equipment: Rolling Walker (2 wheels);Cane - single point;BSC/3in1;Shower seat      Prior Function Prior Level of Function : Independent/Modified Independent;Driving  Mobility Comments: pt reports ind with amb without AD ADLs Comments: pt reports ind with self care, light household chores     Hand Dominance        Extremity/Trunk Assessment   Upper Extremity Assessment Upper Extremity Assessment: Defer to OT evaluation    Lower Extremity Assessment Lower Extremity Assessment: Overall WFL for tasks assessed (AROM WFL, strength grossly 4-/, denies numbness/tingling throughout BLE)    Cervical / Trunk Assessment Cervical / Trunk Assessment: Normal  Communication   Communication: No difficulties  Cognition Arousal/Alertness: Awake/alert Behavior During Therapy: WFL for tasks assessed/performed Overall Cognitive Status: Within Functional Limits for tasks assessed     General Comments General comments (skin integrity, edema, etc.): Pt on 3L with SpO2 90% while seated and 90% post-ambulation, unable to maintain finger probe on with ambulation- RN notified    Exercises     Assessment/Plan    PT Assessment Patient needs continued PT services  PT Problem List Decreased strength;Decreased activity tolerance;Decreased balance;Decreased knowledge of use of DME;Decreased safety awareness;Decreased knowledge of precautions;Cardiopulmonary status limiting activity;Pain       PT Treatment Interventions DME  instruction;Gait training;Stair training;Functional mobility training;Therapeutic activities;Therapeutic exercise;Balance training;Neuromuscular re-education;Patient/family education    PT Goals (Current goals can be found in the Care Plan section)  Acute Rehab PT Goals Patient Stated Goal: less pain PT Goal Formulation: With patient Time For Goal Achievement: 07/02/22 Potential to Achieve Goals: Good    Frequency Min 1X/week     Co-evaluation               AM-PAC PT "6 Clicks" Mobility  Outcome Measure Help needed turning from your back to your side while in a flat bed without using bedrails?: A Little Help needed moving from lying on your back to sitting on the side of a flat bed without using bedrails?: A Little Help needed moving to and from a bed to a chair (including a wheelchair)?: A Little Help needed standing up from a chair using your arms (e.g., wheelchair or bedside chair)?: A Little Help needed to walk in hospital room?: A Little Help needed climbing 3-5 steps with a railing? : A Lot 6 Click Score: 17    End of Session Equipment Utilized During Treatment: Gait belt Activity Tolerance: Patient tolerated treatment well Patient left: in chair;with call bell/phone within reach;with family/visitor present Nurse Communication: Mobility status;Other (comment) (SPO2) PT Visit Diagnosis: Other abnormalities of gait and mobility (R26.89);Pain    Time: 1610-9604 PT Time Calculation (min) (ACUTE ONLY): 25 min   Charges:   PT Evaluation $PT Eval Low Complexity: 1 Low PT Treatments $Therapeutic Activity: 8-22 mins         Tori Scot Shiraishi PT, DPT 06/18/22, 12:07 PM

## 2022-06-18 NOTE — Progress Notes (Unsigned)
Enrolled for [employer] to mail a ZIO XT long term holter monitor to the patients address on file.   Dr. Hilty to read. 

## 2022-06-18 NOTE — Progress Notes (Signed)
Status post ERCP PROGRESS NOTE    [name]  VHQ:[id number] DOB: 08-17-1940 DOA: 06/13/2022 PCP: [name], NP   Brief Narrative:  82 y.o. female with medical history significant of COPD, GERD, hypothyroidism, essential hypertension, hyperlipidemia, hyperlipidemia, asthma, history of pulmonary embolism on Eliquis who presented to med [facility] with chest and epigastric pain.  Found to have gallstone pancreatitis. S/p ERCP on 5/6.  General surgery following for possible laparoscopic cholecystectomy.    Assessment & Plan:  Principal Problem:   Acute pancreatitis Active Problems:   Hypothyroidism   Essential hypertension, benign   Mixed hyperlipidemia   COPD (chronic obstructive pulmonary disease) (HCC)   GERD without esophagitis   Pulmonary embolism (HCC)   OSA (obstructive sleep apnea)     Gallstone Pancreatitis  Choledocholithiasis  Cholelithiasis And initially lipase significantly elevated which is now trended down.  Status post ERCP 5/6.  General surgery following for possible laparoscopic cholecystectomy.  Pain control.   Atrial Tachycardia Echo with EF 50-55%, no RWMA, grade 1 diastolic dysfunction, mild dilation of LA and RA, IVC dilated with < 50% resp variability Zio patch upon discharge  Hypokalemia/hypomagnesemia - As needed repletion   Elevated BNP  Dilated IVC with <50% Respiratory Variability Some signs of volume overload, received as needed Lasix   Hypertension Continue losartan, metoprolol twice daily.  IV as needed ordered   Minimal Change Disease  CKD  Tacrolimus   COPD As needed bronchodilators  Hx PE Outpatient Eliquis currently on hold.   Hypothyroidism Synthroid   Dyslipidemia statin   PT/OT   DVT prophylaxis: Lovenox Code Status: full Family Communication: none Disposition:    Status is: Inpatient Remains inpatient appropriate because: Will attempt to wean her off oxygen, have physical therapy see  her.  Will ensure pain is adequately controlled.  Hopefully discharge either later today or tomorrow         Diet Orders (From admission, onward)     Start     Ordered   06/18/22 1022  DIET SOFT Room service appropriate? Yes; Fluid consistency: Thin  Diet effective now       Question Answer Comment  Room service appropriate? Yes   Fluid consistency: Thin      06/18/22 1021            Subjective:  Tolerating liquid diet this morning.  Remains on 1-2 L nasal cannula.  Overall feels very weak.  Also reporting of surgical site pain.  Denies any nausea, vomiting.  She is passing gas  Examination: Constitutional: Not in acute distress Respiratory: Bibasilar rhonchi Cardiovascular: Normal sinus rhythm, no rubs Abdomen: Nontender nondistended good bowel sounds Musculoskeletal: No edema noted Skin: No rashes seen Neurologic: CN 2-12 grossly intact.  And nonfocal Psychiatric: Normal judgment and insight. Alert and oriented x 3. Normal mood. Objective: Vitals:   06/18/22 0452 06/18/22 0541 06/18/22 0855 06/18/22 0858  BP: 136/61   (!) 169/64  Pulse: 61   65  Resp: 20   17  Temp: 98.4 F (36.9 C)   98.5 F (36.9 C)  TempSrc: Oral   Oral  SpO2: 98%  96% 97%  Weight:  102.9 kg    Height:        Intake/Output Summary (Last 24 hours) at 06/18/2022 1135 Last data filed at 06/18/2022 0847 Gross per 24 hour  Intake 1485 ml  Output 670 ml  Net 815 ml   Filed Weights   06/16/22 0500 06/17/22 0604 06/18/22 0541  Weight: 105.6  kg 106.3 kg 102.9 kg    Scheduled Meds:  acetaminophen  1,000 mg Oral Q6H   apixaban  5 mg Oral BID   citalopram  20 mg Oral Daily   donepezil  10 mg Oral QHS   [START ON 06/19/2022] ferrous sulfate  325 mg Oral Q breakfast   fluticasone furoate-vilanterol  1 puff Inhalation Daily   And   umeclidinium bromide  1 puff Inhalation Daily   hydrALAZINE  10 mg Oral Q8H   ipratropium  2 spray Each Nare BID   levothyroxine  88 mcg Oral Q0600   lidocaine   1 patch Transdermal Q24H   losartan  100 mg Oral Daily   metoprolol tartrate  25 mg Oral BID   montelukast  10 mg Oral QHS   pantoprazole  40 mg Oral Daily   rOPINIRole  1 mg Oral QHS   rosuvastatin  20 mg Oral Daily   tacrolimus  1 mg Oral BID   Continuous Infusions:    Nutritional status     Body mass index is 34.49 kg/m.  Data Reviewed:   CBC: Recent Labs  Lab 06/13/22 0504 06/13/22 1857 06/14/22 0417 06/15/22 0400 06/16/22 0401 06/17/22 0358 06/18/22 0416  WBC 15.3*   < > 11.1* 11.1* 6.8 11.0* 11.3*  NEUTROABS 12.1*  --   --  7.7 5.6 8.0*  --   HGB 13.0   < > 12.3 12.3 11.1* 10.9* 11.0*  HCT 40.7   < > 41.0 40.6 37.2 36.2 36.8  MCV 86.4   < > 91.5 91.4 90.7 90.0 91.5  PLT 296   < > 269 242 221 241 235   < > = values in this interval not displayed.   Basic Metabolic Panel: Recent Labs  Lab 06/13/22 0800 06/13/22 1857 06/14/22 0417 06/15/22 0400 06/16/22 0401 06/17/22 0358 06/18/22 0416  NA  --   --  140 137 134* 136 138  K  --   --  4.4 4.3 4.8 3.3* 4.8  CL  --   --  104 102 99 98 99  CO2  --   --  29 28 28 29  33*  GLUCOSE  --   --  127* 102* 149* 135* 154*  BUN  --   --  15 14 16 14 17   CREATININE  --    < > 0.90 0.92 0.91 0.91 1.04*  CALCIUM  --   --  8.0* 8.2* 8.3* 8.0* 8.0*  MG 1.6*  --   --  1.8 1.9 1.5* 2.1  PHOS 3.6  --   --  3.8 4.6 3.2  --    < > = values in this interval not displayed.   GFR: Estimated Creatinine Clearance: 52.3 mL/min (A) (by C-G formula based on SCr of 1.04 mg/dL (H)). Liver Function Tests: Recent Labs  Lab 06/14/22 0417 06/15/22 0400 06/16/22 0401 06/17/22 0358 06/18/22 0416  AST 23 18 17 20  46*  ALT 22 19 23 23  38  ALKPHOS 39 43 32* 33* 35*  BILITOT 0.5 0.8 0.5 0.7 0.4  PROT 6.9 7.3 6.3* 6.4* 6.6  ALBUMIN 3.2* 3.2* 2.9* 3.1* 3.0*   Recent Labs  Lab 06/13/22 0800 06/15/22 1015  LIPASE 3,094* 47   No results for input(s): "AMMONIA" in the last 168 hours. Coagulation Profile: No results for input(s):  "INR", "PROTIME" in the last 168 hours. Cardiac Enzymes: No results for input(s): "CKTOTAL", "CKMB", "CKMBINDEX", "TROPONINI" in the last 168 hours. BNP (last 3 results) No  results for input(s): "PROBNP" in the last 8760 hours. HbA1C: No results for input(s): "HGBA1C" in the last 72 hours. CBG: Recent Labs  Lab 06/17/22 1244  GLUCAP 107*   Lipid Profile: No results for input(s): "CHOL", "HDL", "LDLCALC", "TRIG", "CHOLHDL", "LDLDIRECT" in the last 72 hours. Thyroid Function Tests: No results for input(s): "TSH", "T4TOTAL", "FREET4", "T3FREE", "THYROIDAB" in the last 72 hours. Anemia Panel: No results for input(s): "VITAMINB12", "FOLATE", "FERRITIN", "TIBC", "IRON", "RETICCTPCT" in the last 72 hours. Sepsis Labs: No results for input(s): "PROCALCITON", "LATICACIDVEN" in the last 168 hours.  No results found for this or any previous visit (from the past 240 hour(s)).       Radiology Studies: DG CHEST PORT 1 VIEW  Result Date: 06/16/2022 CLINICAL DATA:  Elevated BNP EXAM: PORTABLE CHEST 1 VIEW COMPARISON:  Portable exam 1440 hours compared to 06/13/2022 FINDINGS: Enlargement of cardiac silhouette with slight pulmonary vascular congestion. Mediastinal contours normal. Subsegmental atelectasis RIGHT base. No definite pulmonary infiltrate or edema. No pleural effusion, pneumothorax or acute osseous findings. Diffuse osseous demineralization noted. IMPRESSION: Enlargement of cardiac silhouette with pulmonary vascular congestion. Subsegmental atelectasis RIGHT base. Electronically Signed   By: Ulyses Southward M.D.   On: 06/16/2022 15:05           LOS: 5 days   Time spent= 35 mins    Khy Pitre Joline Maxcy, MD [facility]  If 7PM-7AM, please contact night-coverage  06/18/2022, 11:35 AM

## 2022-06-19 ENCOUNTER — Inpatient Hospital Stay (HOSPITAL_COMMUNITY): Payer: Medicare PPO

## 2022-06-19 DIAGNOSIS — K8581 Other acute pancreatitis with uninfected necrosis: Secondary | ICD-10-CM | POA: Diagnosis not present

## 2022-06-19 LAB — CBC
HCT: 36.8 % (ref 36.0–46.0)
Hemoglobin: 10.9 g/dL — ABNORMAL LOW (ref 12.0–15.0)
MCH: 27.4 pg (ref 26.0–34.0)
MCHC: 29.6 g/dL — ABNORMAL LOW (ref 30.0–36.0)
MCV: 92.5 fL (ref 80.0–100.0)
Platelets: 225 10*3/uL (ref 150–400)
RBC: 3.98 MIL/uL (ref 3.87–5.11)
RDW: 15.8 % — ABNORMAL HIGH (ref 11.5–15.5)
WBC: 10.8 10*3/uL — ABNORMAL HIGH (ref 4.0–10.5)
nRBC: 0 % (ref 0.0–0.2)

## 2022-06-19 LAB — SURGICAL PATHOLOGY

## 2022-06-19 LAB — COMPREHENSIVE METABOLIC PANEL
ALT: 32 U/L (ref 0–44)
AST: 26 U/L (ref 15–41)
Albumin: 2.8 g/dL — ABNORMAL LOW (ref 3.5–5.0)
Alkaline Phosphatase: 35 U/L — ABNORMAL LOW (ref 38–126)
Anion gap: 8 (ref 5–15)
BUN: 20 mg/dL (ref 8–23)
CO2: 31 mmol/L (ref 22–32)
Calcium: 8 mg/dL — ABNORMAL LOW (ref 8.9–10.3)
Chloride: 101 mmol/L (ref 98–111)
Creatinine, Ser: 1.01 mg/dL — ABNORMAL HIGH (ref 0.44–1.00)
GFR, Estimated: 56 mL/min — ABNORMAL LOW (ref >60)
Glucose, Bld: 133 mg/dL — ABNORMAL HIGH (ref 70–99)
Potassium: 3.9 mmol/L (ref 3.5–5.1)
Sodium: 140 mmol/L (ref 135–145)
Total Bilirubin: 0.5 mg/dL (ref 0.3–1.2)
Total Protein: 6.2 g/dL — ABNORMAL LOW (ref 6.5–8.1)

## 2022-06-19 LAB — BRAIN NATRIURETIC PEPTIDE: B Natriuretic Peptide: 139.2 pg/mL — ABNORMAL HIGH (ref 0.0–100.0)

## 2022-06-19 LAB — MAGNESIUM: Magnesium: 1.8 mg/dL (ref 1.7–2.4)

## 2022-06-19 MED ORDER — FUROSEMIDE 10 MG/ML IJ SOLN
40.0000 mg | Freq: Four times a day (QID) | INTRAMUSCULAR | Status: AC
  Administered 2022-06-19 (×2): 40 mg via INTRAVENOUS
  Filled 2022-06-19 (×2): qty 4

## 2022-06-19 NOTE — Progress Notes (Addendum)
Physical Therapy Treatment Patient Details Name: [name] MRN: [id number] DOB: 02-24-1940 Today's Date: 06/19/2022   History of Present Illness 82 y.o. female found to have gallstone pancreatitis. Pt is s/p ERCP on 5/6 and s/p lap chole 5/8. PMH: COPD, GERD, hypothyroidism, essential hypertension, hyperlipidemia, hyperlipidemia, asthma, history of pulmonary embolism on Eliquis    PT Comments    Pt needing supv for STS transfer from recliner and toilet, good hand placement, RW present, therapist managing O2 tubing. Pt amb 40 ft with RW, step through gait pattern, visible dyspnea, unable to obtain walking SpO2 due to pulse ox not staying on pt's finger; Pt on 3L O2 with SpO2 95% post ambulation once seated EOB and 98% pre-ambulation. Pt with nail polish on, needed to rotate pulse ox to obtain reading as pulse ox was unable to register through polish. Encouraged pt on sitting in recliner and ambulation in hallway with nursing and therapy as able to continue progressing back to PLOF and pt verbalizes understanding.    Recommendations for follow up therapy are one component of a multi-disciplinary discharge planning process, led by the attending physician.  Recommendations may be updated based on patient status, additional functional criteria and insurance authorization.  Follow Up Recommendations       Assistance Recommended at Discharge Set up Supervision/Assistance  Patient can return home with the following A little help with bathing/dressing/bathroom;Assistance with cooking/housework;Assist for transportation;Help with stairs or ramp for entrance   Equipment Recommendations  None recommended by PT    Recommendations for Other Services       Precautions / Restrictions Precautions Precautions: Fall Precaution Comments: monitor SpO2 Restrictions Weight Bearing Restrictions: No     Mobility  Bed Mobility Overal bed mobility: Modified Independent Bed Mobility: Sit to  Supine  Sit to supine: Modified independent (Device/Increase time)  General bed mobility comments: return to supine, modified ind    Transfers Overall transfer level: Needs assistance Equipment used: Rolling walker (2 wheels) Transfers: Sit to/from Stand Sit to Stand: Supervision  General transfer comment: supv, good BUE assisting to power up, avoids breath holding    Ambulation/Gait Ambulation/Gait assistance: Supervision Gait Distance (Feet): 40 Feet Assistive device: Rolling walker (2 wheels) Gait Pattern/deviations: Step-through pattern, Decreased stride length Gait velocity: decreased  General Gait Details: step through gait pattern with trunk more upright, decreased cadence, dyspnea 2/4 but pulse ox unable to register, once return to sitting SpO2 95% on 3L O2.   Stairs             Wheelchair Mobility    Modified Rankin (Stroke Patients Only)       Balance Overall balance assessment: Needs assistance Sitting-balance support: Feet supported Sitting balance-Leahy Scale: Good  Standing balance support: During functional activity, Bilateral upper extremity supported Standing balance-Leahy Scale: Fair Standing balance comment: fair static able to release BUE, dynamic with RW       Cognition Arousal/Alertness: Awake/alert Behavior During Therapy: WFL for tasks assessed/performed Overall Cognitive Status: Within Functional Limits for tasks assessed     Exercises      General Comments General comments (skin integrity, edema, etc.): Pt on 3L O2, SpO2 98% at rest and 95% post ambulation, unable to obtain reading during ambulation due to pulse ox unable to obtain during ambulation; rotated pulse ox to obtain reading due to pt's fingernail polish limiting reading      Pertinent Vitals/Pain Pain Assessment Pain Assessment: Faces Faces Pain Scale: Hurts a little bit Pain Location: surgical site abdomen Pain Descriptors /  Indicators: Grimacing, Guarding Pain  Intervention(s): Limited activity within patient's tolerance, Monitored during session, Repositioned    Home Living                          Prior Function            PT Goals (current goals can now be found in the care plan section) Acute Rehab PT Goals Patient Stated Goal: less pain PT Goal Formulation: With patient Time For Goal Achievement: 07/02/22 Potential to Achieve Goals: Good Progress towards PT goals: Progressing toward goals    Frequency    Min 1X/week      PT Plan Current plan remains appropriate    Co-evaluation              AM-PAC PT "6 Clicks" Mobility   Outcome Measure  Help needed turning from your back to your side while in a flat bed without using bedrails?: A Little Help needed moving from lying on your back to sitting on the side of a flat bed without using bedrails?: A Little Help needed moving to and from a bed to a chair (including a wheelchair)?: A Little Help needed standing up from a chair using your arms (e.g., wheelchair or bedside chair)?: A Little Help needed to walk in hospital room?: A Little Help needed climbing 3-5 steps with a railing? : A Lot 6 Click Score: 17    End of Session Equipment Utilized During Treatment: Gait belt;Oxygen Activity Tolerance: Patient tolerated treatment well Patient left: in bed;with call bell/phone within reach;with bed alarm set Nurse Communication: Mobility status;Other (comment) (SPO2) PT Visit Diagnosis: Other abnormalities of gait and mobility (R26.89);Pain     Time: 1350-1409 PT Time Calculation (min) (ACUTE ONLY): 19 min  Charges:  $Gait Training: 8-22 mins                      Tori Ranya Fiddler PT, DPT 06/19/22, 2:20 PM

## 2022-06-19 NOTE — ED Notes (Signed)
Patient became short of breath when ambulating to bathroom. Oxygen level 85% on 3 LNC. After taking some deep breaths and resting oxygen level increased to 93% on 3L [place]. Patient up in chair, no acute distress, and call bell within reach. Will continue to follow POC.

## 2022-06-19 NOTE — Evaluation (Signed)
Occupational Therapy Evaluation Patient Details Name: [name] MRN: [id number] DOB: March 06, 1940 Today's Date: 06/19/2022   History of Present Illness 82 y.o. female found to have gallstone pancreatitis. Pt is s/p ERCP on 5/6 and s/p lap chole 5/8. PMH: COPD, GERD, hypothyroidism, essential hypertension, hyperlipidemia, hyperlipidemia, asthma, history of pulmonary embolism on Eliquis   Clinical Impression   Patient evaluated by Occupational Therapy with no further acute OT needs identified. All education, including handouts and discussion of energy conservation strategies and use of adaptive equipment for task simplification and pain management with ADLs at home, has been completed and the patient has no further questions.  See below for any follow-up Occupational Therapy or equipment needs. OT is signing off. Thank you for this referral.       Recommendations for follow up therapy are one component of a multi-disciplinary discharge planning process, led by the attending physician.  Recommendations may be updated based on patient status, additional functional criteria and insurance authorization.   Assistance Recommended at Discharge Set up Supervision/Assistance  Patient can return home with the following A little help with bathing/dressing/bathroom;Assistance with cooking/housework;A little help with walking and/or transfers    Functional Status Assessment  Patient has had a recent decline in their functional status and demonstrates the ability to make significant improvements in function in a reasonable and predictable amount of time.  Equipment Recommendations       Recommendations for Other Services       Precautions / Restrictions Precautions Precautions: Fall Precaution Comments: monitor SpO2 Restrictions Weight Bearing Restrictions: No      Mobility Bed Mobility               General bed mobility comments: Pt up in recliner.                               Balance Overall balance assessment: Needs assistance Sitting-balance support: Feet supported Sitting balance-Leahy Scale: Good     Standing balance support: During functional activity, Single extremity supported Standing balance-Leahy Scale: Fair (standing at sink for grooming)                             ADL either performed or assessed with clinical judgement   ADL Overall ADL's : Needs assistance/impaired     Grooming: Wash/dry hands;Standing;Supervision/safety   Upper Body Bathing: Sitting;Set up Upper Body Bathing Details (indicate cue type and reason): Per general assessment. Lower Body Bathing: Minimal assistance;Sitting/lateral leans Lower Body Bathing Details (indicate cue type and reason): Pt educated on use of long handled brush and recommended that pt use her shower chair for energy conservation. Upper Body Dressing : Set up;Sitting   Lower Body Dressing: Minimal assistance Lower Body Dressing Details (indicate cue type and reason): Pt  unable to tolerate leaning forward to doff socks. Pt does have a reacher at home and was educated on use to doff socks and assist with donning under/pants while seated. Pt educated on sock aid and long shoe horn as well. Pt reports that she was trained in all adaptive equipment a few years ago following back surgery and declined to practice stating that she felt comfortable with use at home if needed. Toilet Transfer: Ambulation;Min guard;Rolling walker (2 wheels);Comfort height toilet Toilet Transfer Details (indicate cue type and reason): Min Guard as pt had posterior drift when lining herself up to toilet. Pt was able to self  correct and stood from toilet without assist. Toileting- Clothing Manipulation and Hygiene: Supervision/safety;Sit to/from stand;Sitting/lateral lean Toileting - Clothing Manipulation Details (indicate cue type and reason): Pt able to manage clothing and perform her peri care with distant  supervision.     Functional mobility during ADLs: Min guard;Supervision/safety;Cueing for safety;Cueing for sequencing;Rolling walker (2 wheels)       Vision Baseline Vision/History: 0 No visual deficits Vision Assessment?: No apparent visual deficits     Perception     Praxis      Pertinent Vitals/Pain Pain Assessment Pain Assessment: Faces Faces Pain Scale: Hurts little more Pain Location: surgical site abdomen during functional ADL activity. Denied pain at rest. Pain Descriptors / Indicators: Sore, Grimacing, Guarding Pain Intervention(s): Limited activity within patient's tolerance, Monitored during session, Repositioned (Ed pt on compensatory strategies to avoid forward bend at waist for pain management)     Hand Dominance Right   Extremity/Trunk Assessment Upper Extremity Assessment Upper Extremity Assessment: Generalized weakness   Lower Extremity Assessment Lower Extremity Assessment: Generalized weakness   Cervical / Trunk Assessment Cervical / Trunk Assessment: Normal   Communication Communication Communication: HOH   Cognition Arousal/Alertness: Awake/alert Behavior During Therapy: WFL for tasks assessed/performed Overall Cognitive Status: Within Functional Limits for tasks assessed                                       General Comments       Exercises Other Exercises Other Exercises: Energy conservation handouts provided and ket concepts discussed with pt including reinforcement of pt's adaptive equipment at home. Pt verbalized understanding and denied question.   Shoulder Instructions      Home Living Family/patient expects to be discharged to:: Private residence Living Arrangements: Children Available Help at Discharge: Family;Available PRN/intermittently (Lives with daughter who works) Type of Home: House Home Access: Stairs to enter Secretary/administrator of Steps: 2 Entrance Stairs-Rails: Right;Left Home Layout: Two  level;Able to live on main level with bedroom/bathroom     Bathroom Shower/Tub: Producer, television/film/video: Standard     Home Equipment: Agricultural consultant (2 wheels);Cane - single point;BSC/3in1;Shower seat;Adaptive equipment Adaptive Equipment: Reacher        Prior Functioning/Environment Prior Level of Function : Independent/Modified Independent;Driving             Mobility Comments: pt reports ind with amb without AD ADLs Comments: pt reports ind with self care, light household chores and cooking        OT Problem List: Decreased activity tolerance;Cardiopulmonary status limiting activity;Pain      OT Treatment/Interventions:      OT Goals(Current goals can be found in the care plan section) Acute Rehab OT Goals Patient Stated Goal: Pt denied feeling a need for any follow up with home health OT. OT Goal Formulation: All assessment and education complete, DC therapy Potential to Achieve Goals: Good ADL Goals Additional ADL Goal #1: Patient will identify at least 3 energy conservation strategies to employ at home in order to maximize function and quality of life and decrease caregiver burden while preventing exacerbation of symptoms and rehospitalization.  OT Frequency:      Co-evaluation              AM-PAC OT "6 Clicks" Daily Activity     Outcome Measure Help from another person eating meals?: None Help from another person taking care of personal grooming?: None Help from  another person toileting, which includes using toliet, bedpan, or urinal?: A Little Help from another person bathing (including washing, rinsing, drying)?: A Little Help from another person to put on and taking off regular upper body clothing?: None Help from another person to put on and taking off regular lower body clothing?: A Little 6 Click Score: 21   End of Session Equipment Utilized During Treatment: Rolling walker (2 wheels);Oxygen Nurse Communication: Other (comment) (Rn in  room as pt ambulating back to chair)  Activity Tolerance: Patient tolerated treatment well Patient left: in chair;with call bell/phone within reach;with nursing/sitter in room  OT Visit Diagnosis: Unsteadiness on feet (R26.81);Pain Pain - part of body:  (ABD)                Time: 8119-1478 OT Time Calculation (min): 26 min Charges:  OT General Charges $OT Visit: 1 Visit OT Evaluation $OT Eval Low Complexity: 1 Low OT Treatments $Self Care/Home Management : 8-22 mins  Victorino Dike, OT Acute Rehab Services Office: [phone] 06/19/2022   Theodoro Clock 06/19/2022, 10:05 AM

## 2022-06-19 NOTE — Progress Notes (Signed)
[facility] Progress Note  2 Days Post-Op  Subjective: Feeling better than yesterday but still has lower abdominal pain. No nausea or vomiting. On oxygen, had desats when ambulating this morning.    Objective: Vital signs in last 24 hours: Temp:  [98.3 F (36.8 C)-98.6 F (37 C)] 98.3 F (36.8 C) (05/10 0517) Pulse Rate:  [64-68] 64 (05/10 0517) Resp:  [17-20] 20 (05/10 0517) BP: (144-187)/(65-82) 176/68 (05/10 0633) SpO2:  [90 %-94 %] 93 % (05/10 0823) Weight:  [102.6 kg] 102.6 kg (05/10 0600) Last BM Date :  (pta)  Intake/Output from previous day: 05/09 0701 - 05/10 0700 In: 460 [P.O.:460] Out: 200 [Urine:200] Intake/Output this shift: No intake/output data recorded.  PE: NAD Alert and oriented Nonlabored respirations on nasal cannula Abd: Soft, minimally tender, incisions clean and dry with no erythema or induration   Lab Results:  Recent Labs    06/18/22 0416 06/19/22 0415  WBC 11.3* 10.8*  HGB 11.0* 10.9*  HCT 36.8 36.8  PLT 235 225   BMET Recent Labs    06/18/22 0416 06/19/22 0415  NA 138 140  K 4.8 3.9  CL 99 101  CO2 33* 31  GLUCOSE 154* 133*  BUN 17 20  CREATININE 1.04* 1.01*  CALCIUM 8.0* 8.0*   PT/INR No results for input(s): "LABPROT", "INR" in the last 72 hours. CMP     Component Value Date/Time   NA 140 06/19/2022 0415   K 3.9 06/19/2022 0415   CL 101 06/19/2022 0415   CO2 31 06/19/2022 0415   GLUCOSE 133 (H) 06/19/2022 0415   BUN 20 06/19/2022 0415   CREATININE 1.01 (H) 06/19/2022 0415   CREATININE 0.98 (H) 01/19/2022 0851   CALCIUM 8.0 (L) 06/19/2022 0415   PROT 6.2 (L) 06/19/2022 0415   ALBUMIN 2.8 (L) 06/19/2022 0415   AST 26 06/19/2022 0415   ALT 32 06/19/2022 0415   ALKPHOS 35 (L) 06/19/2022 0415   BILITOT 0.5 06/19/2022 0415   GFRNONAA 56 (L) 06/19/2022 0415   GFRNONAA 53 (L) 06/12/2013 0823   GFRAA 52 (L) 02/12/2019 0501   GFRAA 61 06/12/2013 0823   Lipase     Component Value Date/Time   LIPASE 47  06/15/2022 1015       Studies/Results: DG Chest Port 1 View  Result Date: 06/19/2022 CLINICAL DATA:  Dyspnea, asthma, COPD, hypertension, smoking history EXAM: PORTABLE CHEST 1 VIEW COMPARISON:  Portable exam 0014 hours compared to 06/16/2022 FINDINGS: Enlargement of cardiac silhouette with pulmonary vascular congestion. Mediastinal contour stable. Perihilar interstitial infiltrates with mild atelectasis in the RIGHT mid lung. Elevation of RIGHT diaphragm. No pleural effusion or pneumothorax. Bones demineralized. IMPRESSION: Enlargement of cardiac silhouette with pulmonary vascular congestion. Perihilar infiltrates favor pulmonary edema with subsegmental atelectasis mid RIGHT lung. Electronically Signed   By: Ulyses Southward M.D.   On: 06/19/2022 08:42      Assessment/Plan  POD 2, s/p laparoscopic cholecystectomy on 5/8 for Gallstone pancreatitis and choledocholithiasis. - s/p ERCP sphincterotomy 5/6 Dr. Ewing Schlein - Expected postoperative pain is improving - Continue regular diet - Multimodal pain control - No acute postop issues. Ok for discharge from a surgical standpoint once patient is medically stable. She remains on oxygen this morning.   FEN: SOFT VTE: SCD's, eliquis    LOS: 6 days   Fritzi Mandes, MD [facility] Please see Amion for pager number during day hours 7:00am-4:30pm

## 2022-06-19 NOTE — Progress Notes (Addendum)
Status post ERCP PROGRESS NOTE    [name]  WUJ:[id number] DOB: [birth year] DOA: 06/13/2022 PCP: [name], NP   Brief Narrative:  82 y.o. female with medical history significant of COPD, GERD, hypothyroidism, essential hypertension, hyperlipidemia, hyperlipidemia, asthma, history of pulmonary embolism on Eliquis who presented to med [facility] with chest and epigastric pain.  Found to have gallstone pancreatitis. S/p ERCP on 5/6.  General surgery following for possible laparoscopic cholecystectomy.    Assessment & Plan:  Principal Problem:   Acute pancreatitis Active Problems:   Hypothyroidism   Essential hypertension, benign   Mixed hyperlipidemia   COPD (chronic obstructive pulmonary disease) (HCC)   GERD without esophagitis   Pulmonary embolism (HCC)   OSA (obstructive sleep apnea)     Gallstone Pancreatitis  Choledocholithiasis  Cholelithiasis And initially lipase significantly elevated which is now trended down.  Status post ERCP 5/6.  General surgery following for possible laparoscopic cholecystectomy.  Pain control.   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 Class 3 Atelectasis  Acute hypoxia.  Some signs of volume overload, elevated BNP. Will order Lasix 40mg  IV x 2 IS/ flutter.  CXR reviewed.    Hypertension Continue losartan, metoprolol twice daily.  IV as needed ordered   Minimal Change Disease  CKD  Tacrolimus   COPD As needed bronchodilators  Hx PE Outpatient Eliquis    Hypothyroidism Synthroid   Dyslipidemia statin   PT/OT   DVT prophylaxis: Eliqis Code Status: full Family Communication: Daughter updated Disposition:    Status is: Inpatient Remains inpatient appropriate because: Will attempt to  wean her off oxygen, have physical therapy see her.  Will ensure pain is adequately controlled.  Hopefully discharge either later today or tomorrow         Diet Orders (From admission, onward)     Start     Ordered   06/18/22 1022  DIET SOFT Room service appropriate? Yes; Fluid consistency: Thin  Diet effective now       Question Answer Comment  Room service appropriate? Yes   Fluid consistency: Thin      06/18/22 1021            Subjective:  Sitting in the chair, Still has exertional hypoxia.   Examination: Constitutional: Not in acute distress; 3L Delta Respiratory:  bibasilar rhonchi Cardiovascular: Normal sinus rhythm, no rubs Abdomen: Nontender nondistended good bowel sounds Musculoskeletal: No edema noted Skin: No rashes seen Neurologic: CN 2-12 grossly intact.  And nonfocal Psychiatric: Normal judgment and insight. Alert and oriented x 3. Normal mood.   Objective: Vitals:   06/19/22 0633 06/19/22 0823 06/19/22 0946 06/19/22 0949  BP: (!) 176/68  (!) 146/80   Pulse:   60 60  Resp:      Temp:      TempSrc:      SpO2:  93%  92%  Weight:      Height:        Intake/Output Summary (Last 24 hours) at 06/19/2022 1156 Last data filed at 06/19/2022 1102 Gross per 24 hour  Intake 940 ml  Output 150 ml  Net 790 ml   Filed Weights   06/17/22 0604 06/18/22 0541 06/19/22 0600  Weight: 106.3 kg 102.9 kg 102.6 kg    Scheduled Meds:  acetaminophen  1,000 mg Oral Q6H   apixaban  5 mg Oral BID   citalopram  20 mg Oral Daily   donepezil  10 mg Oral QHS   ferrous sulfate  325 mg Oral Q breakfast   fluticasone furoate-vilanterol  1 puff Inhalation Daily   And   umeclidinium bromide  1 puff Inhalation Daily   furosemide  40 mg Intravenous Q6H   hydrALAZINE  10 mg Oral Q8H   ipratropium  2 spray Each Nare BID   levothyroxine  88 mcg Oral Q0600   lidocaine  1 patch Transdermal Q24H   losartan  100 mg Oral Daily   metoprolol tartrate  25 mg Oral BID    montelukast  10 mg Oral QHS   pantoprazole  40 mg Oral Daily   rOPINIRole  1 mg Oral QHS   rosuvastatin  20 mg Oral Daily   tacrolimus  1 mg Oral BID   Continuous Infusions:    Nutritional status     Body mass index is 34.39 kg/m.  Data Reviewed:   CBC: Recent Labs  Lab 06/13/22 0504 06/13/22 1857 06/15/22 0400 06/16/22 0401 06/17/22 0358 06/18/22 0416 06/19/22 0415  WBC 15.3*   < > 11.1* 6.8 11.0* 11.3* 10.8*  NEUTROABS 12.1*  --  7.7 5.6 8.0*  --   --   HGB 13.0   < > 12.3 11.1* 10.9* 11.0* 10.9*  HCT 40.7   < > 40.6 37.2 36.2 36.8 36.8  MCV 86.4   < > 91.4 90.7 90.0 91.5 92.5  PLT 296   < > 242 221 241 235 225   < > = values in this interval not displayed.   Basic Metabolic Panel: Recent Labs  Lab 06/13/22 0800 06/13/22 1857 06/15/22 0400 06/16/22 0401 06/17/22 0358 06/18/22 0416 06/19/22 0415  NA  --    < > 137 134* 136 138 140  K  --    < > 4.3 4.8 3.3* 4.8 3.9  CL  --    < > 102 99 98 99 101  CO2  --    < > 28 28 29  33* 31  GLUCOSE  --    < > 102* 149* 135* 154* 133*  BUN  --    < > 14 16 14 17 20   CREATININE  --    < > 0.92 0.91 0.91 1.04* 1.01*  CALCIUM  --    < > 8.2* 8.3* 8.0* 8.0* 8.0*  MG 1.6*  --  1.8 1.9 1.5* 2.1 1.8  PHOS 3.6  --  3.8 4.6 3.2  --   --    < > = values in this interval not displayed.   GFR: Estimated Creatinine Clearance: 53.8 mL/min (A) (by C-G formula based on SCr of 1.01 mg/dL (H)). Liver Function Tests: Recent Labs  Lab 06/15/22 0400 06/16/22 0401 06/17/22 0358 06/18/22 0416 06/19/22 0415  AST 18 17 20  46* 26  ALT 19 23 23  38 32  ALKPHOS 43 32* 33* 35* 35*  BILITOT 0.8 0.5 0.7 0.4 0.5  PROT 7.3 6.3* 6.4* 6.6 6.2*  ALBUMIN 3.2* 2.9* 3.1* 3.0* 2.8*   Recent Labs  Lab 06/13/22 0800 06/15/22 1015  LIPASE 3,094* 47   No results for input(s): "AMMONIA" in the last 168 hours. Coagulation Profile: No results for input(s): "INR", "PROTIME" in the last 168 hours. Cardiac Enzymes: No results for input(s):  "CKTOTAL", "CKMB", "CKMBINDEX", "TROPONINI" in the last 168 hours. BNP (last 3 results) No results for input(s): "  PROBNP" in the last 8760 hours. HbA1C: No results for input(s): "HGBA1C" in the last 72 hours. CBG: Recent Labs  Lab 06/17/22 1244  GLUCAP 107*   Lipid Profile: No results for input(s): "CHOL", "HDL", "LDLCALC", "TRIG", "CHOLHDL", "LDLDIRECT" in the last 72 hours. Thyroid Function Tests: No results for input(s): "TSH", "T4TOTAL", "FREET4", "T3FREE", "THYROIDAB" in the last 72 hours. Anemia Panel: No results for input(s): "VITAMINB12", "FOLATE", "FERRITIN", "TIBC", "IRON", "RETICCTPCT" in the last 72 hours. Sepsis Labs: No results for input(s): "PROCALCITON", "LATICACIDVEN" in the last 168 hours.  No results found for this or any previous visit (from the past 240 hour(s)).       Radiology Studies: DG Chest Port 1 View  Result Date: 06/19/2022 CLINICAL DATA:  Dyspnea, asthma, COPD, hypertension, smoking history EXAM: PORTABLE CHEST 1 VIEW COMPARISON:  Portable exam 0014 hours compared to 06/16/2022 FINDINGS: Enlargement of cardiac silhouette with pulmonary vascular congestion. Mediastinal contour stable. Perihilar interstitial infiltrates with mild atelectasis in the RIGHT mid lung. Elevation of RIGHT diaphragm. No pleural effusion or pneumothorax. Bones demineralized. IMPRESSION: Enlargement of cardiac silhouette with pulmonary vascular congestion. Perihilar infiltrates favor pulmonary edema with subsegmental atelectasis mid RIGHT lung. Electronically Signed   By: Ulyses Southward M.D.   On: 06/19/2022 08:42           LOS: 6 days   Time spent= 35 mins    Brynnan Rodenbaugh Joline Maxcy, MD [facility]  If 7PM-7AM, please contact night-coverage  06/19/2022, 11:56 AM

## 2022-06-19 NOTE — Plan of Care (Signed)
?  Problem: Activity: ?Goal: Risk for activity intolerance will decrease ?Outcome: Progressing ?  ?Problem: Nutrition: ?Goal: Adequate nutrition will be maintained ?Outcome: Progressing ?  ?Problem: Elimination: ?Goal: Will not experience complications related to urinary retention ?Outcome: Progressing ?  ?Problem: Safety: ?Goal: Ability to remain free from injury will improve ?Outcome: Progressing ?  ?

## 2022-06-20 DIAGNOSIS — K858 Other acute pancreatitis without necrosis or infection: Secondary | ICD-10-CM | POA: Diagnosis not present

## 2022-06-20 LAB — COMPREHENSIVE METABOLIC PANEL
ALT: 31 U/L (ref 0–44)
AST: 21 U/L (ref 15–41)
Albumin: 2.9 g/dL — ABNORMAL LOW (ref 3.5–5.0)
Alkaline Phosphatase: 41 U/L (ref 38–126)
Anion gap: 9 (ref 5–15)
BUN: 19 mg/dL (ref 8–23)
CO2: 34 mmol/L — ABNORMAL HIGH (ref 22–32)
Calcium: 8.1 mg/dL — ABNORMAL LOW (ref 8.9–10.3)
Chloride: 98 mmol/L (ref 98–111)
Creatinine, Ser: 0.95 mg/dL (ref 0.44–1.00)
GFR, Estimated: 60 mL/min — ABNORMAL LOW (ref >60)
Glucose, Bld: 131 mg/dL — ABNORMAL HIGH (ref 70–99)
Potassium: 3.6 mmol/L (ref 3.5–5.1)
Sodium: 141 mmol/L (ref 135–145)
Total Bilirubin: 0.6 mg/dL (ref 0.3–1.2)
Total Protein: 6.7 g/dL (ref 6.5–8.1)

## 2022-06-20 LAB — CBC
HCT: 38.6 % (ref 36.0–46.0)
Hemoglobin: 11.4 g/dL — ABNORMAL LOW (ref 12.0–15.0)
MCH: 26.8 pg (ref 26.0–34.0)
MCHC: 29.5 g/dL — ABNORMAL LOW (ref 30.0–36.0)
MCV: 90.6 fL (ref 80.0–100.0)
Platelets: 273 10*3/uL (ref 150–400)
RBC: 4.26 MIL/uL (ref 3.87–5.11)
RDW: 15.4 % (ref 11.5–15.5)
WBC: 9.8 10*3/uL (ref 4.0–10.5)
nRBC: 0 % (ref 0.0–0.2)

## 2022-06-20 LAB — MAGNESIUM: Magnesium: 1.5 mg/dL — ABNORMAL LOW (ref 1.7–2.4)

## 2022-06-20 MED ORDER — POTASSIUM CHLORIDE CRYS ER 20 MEQ PO TBCR
40.0000 meq | EXTENDED_RELEASE_TABLET | Freq: Two times a day (BID) | ORAL | Status: AC
  Administered 2022-06-20 (×2): 40 meq via ORAL
  Filled 2022-06-20 (×2): qty 2

## 2022-06-20 MED ORDER — MAGNESIUM SULFATE 2 GM/50ML IV SOLN
2.0000 g | Freq: Once | INTRAVENOUS | Status: AC
  Administered 2022-06-20: 2 g via INTRAVENOUS
  Filled 2022-06-20: qty 50

## 2022-06-20 MED ORDER — MAGNESIUM OXIDE -MG SUPPLEMENT 400 (240 MG) MG PO TABS
800.0000 mg | ORAL_TABLET | Freq: Once | ORAL | Status: AC
  Administered 2022-06-20: 800 mg via ORAL
  Filled 2022-06-20: qty 2

## 2022-06-20 MED ORDER — FUROSEMIDE 10 MG/ML IJ SOLN
40.0000 mg | Freq: Once | INTRAMUSCULAR | Status: AC
  Administered 2022-06-20: 40 mg via INTRAVENOUS
  Filled 2022-06-20: qty 4

## 2022-06-20 MED ORDER — FUROSEMIDE 10 MG/ML IJ SOLN
20.0000 mg | Freq: Once | INTRAMUSCULAR | Status: AC
  Administered 2022-06-20: 20 mg via INTRAVENOUS
  Filled 2022-06-20: qty 2

## 2022-06-20 NOTE — Progress Notes (Signed)
Status post ERCP PROGRESS NOTE    [name]  ZOX:[id number] DOB: 1940/06/01 DOA: 06/13/2022 PCP: [name], NP   Brief Narrative:  82 y.o. female with medical history significant of COPD, GERD, hypothyroidism, essential hypertension, hyperlipidemia, hyperlipidemia, asthma, history of pulmonary embolism on Eliquis who presented to med [facility] with chest and epigastric pain.  Found to have gallstone pancreatitis. S/p ERCP on 5/6.  General surgery following for possible laparoscopic cholecystectomy.  Postoperatively developed hypoxia secondary to atelectasis and fluid overload    Assessment & Plan:  Principal Problem:   Acute pancreatitis Active Problems:   Hypothyroidism   Essential hypertension, benign   Mixed hyperlipidemia   COPD (chronic obstructive pulmonary disease) (HCC)   GERD without esophagitis   Pulmonary embolism (HCC)   OSA (obstructive sleep apnea)     Gallstone Pancreatitis  Choledocholithiasis  Cholelithiasis And initially lipase significantly elevated which is now trended down.  Status post ERCP 5/6.  General surgery following for possible laparoscopic cholecystectomy.  Pain control.   Atrial Tachycardia Echo with EF 50-55%, no RWMA, grade 1 diastolic dysfunction, mild dilation of LA and RA, IVC dilated with < 50% resp variability Zio patch upon discharge  Hypokalemia/hypomagnesemia - As needed repletion   Acute diastolic CHF, ef 81%. Class 3 Atelectasis  Acute hypoxia.  Some signs of volume overload, elevated BNP.  Lasix IV IS/ flutter.  CXR reviewed.    Hypertension Continue losartan, metoprolol twice daily.  IV as needed ordered   Minimal Change Disease  CKD  Tacrolimus   COPD As needed bronchodilators  Hx PE Outpatient Eliquis    Hypothyroidism Synthroid   Dyslipidemia statin   PT/OT   DVT prophylaxis: Eliqis Code Status: full Family Communication: Son at bedside Disposition:    Status is:  Inpatient Remains inpatient appropriate because: Will attempt to wean her off oxygen today         Diet Orders (From admission, onward)     Start     Ordered   06/18/22 1022  DIET SOFT Room service appropriate? Yes; Fluid consistency: Thin  Diet effective now       Question Answer Comment  Room service appropriate? Yes   Fluid consistency: Thin      06/18/22 1021            Subjective: Feeling little better, pain is much better controlled.   Examination: Constitutional: Not in acute distress, 2 L nasal cannula Respiratory: Clear to auscultation bilaterally Cardiovascular: Normal sinus rhythm, no rubs Abdomen: Nontender nondistended good bowel sounds Musculoskeletal: No edema noted Skin: No rashes seen Neurologic: CN 2-12 grossly intact.  And nonfocal Psychiatric: Normal judgment and insight. Alert and oriented x 3. Normal mood.  Objective: Vitals:   06/20/22 0146 06/20/22 0443 06/20/22 0639 06/20/22 0750  BP: (!) 173/91 (!) 162/80 (!) 153/115   Pulse: 61 (!) 51    Resp: 20 19    Temp: 99.4 F (37.4 C) 98.7 F (37.1 C)    TempSrc: Oral Oral    SpO2: 95% 95%  95%  Weight:  106.9 kg    Height:        Intake/Output Summary (Last 24 hours) at 06/20/2022 0759 Last data filed at 06/20/2022 0648 Gross per 24 hour  Intake 1180 ml  Output 1950 ml  Net -770 ml   Filed Weights   06/18/22 0541 06/19/22 0600 06/20/22 0443  Weight: 102.9 kg 102.6 kg 106.9 kg    Scheduled Meds:  acetaminophen  1,000 mg  Oral Q6H   apixaban  5 mg Oral BID   citalopram  20 mg Oral Daily   donepezil  10 mg Oral QHS   ferrous sulfate  325 mg Oral Q breakfast   fluticasone furoate-vilanterol  1 puff Inhalation Daily   And   umeclidinium bromide  1 puff Inhalation Daily   furosemide  20 mg Intravenous Once   hydrALAZINE  10 mg Oral Q8H   ipratropium  2 spray Each Nare BID   levothyroxine  88 mcg Oral Q0600   lidocaine  1 patch Transdermal Q24H   losartan  100 mg Oral Daily    magnesium oxide  800 mg Oral Once   metoprolol tartrate  25 mg Oral BID   montelukast  10 mg Oral QHS   pantoprazole  40 mg Oral Daily   potassium chloride  40 mEq Oral BID   rOPINIRole  1 mg Oral QHS   rosuvastatin  20 mg Oral Daily   tacrolimus  1 mg Oral BID   Continuous Infusions:  magnesium sulfate bolus IVPB       Nutritional status     Body mass index is 35.83 kg/m.  Data Reviewed:   CBC: Recent Labs  Lab 06/15/22 0400 06/16/22 0401 06/17/22 0358 06/18/22 0416 06/19/22 0415 06/20/22 0352  WBC 11.1* 6.8 11.0* 11.3* 10.8* 9.8  NEUTROABS 7.7 5.6 8.0*  --   --   --   HGB 12.3 11.1* 10.9* 11.0* 10.9* 11.4*  HCT 40.6 37.2 36.2 36.8 36.8 38.6  MCV 91.4 90.7 90.0 91.5 92.5 90.6  PLT 242 221 241 235 225 273   Basic Metabolic Panel: Recent Labs  Lab 06/13/22 0800 06/13/22 1857 06/15/22 0400 06/16/22 0401 06/17/22 0358 06/18/22 0416 06/19/22 0415 06/20/22 0352  NA  --    < > 137 134* 136 138 140 141  K  --    < > 4.3 4.8 3.3* 4.8 3.9 3.6  CL  --    < > 102 99 98 99 101 98  CO2  --    < > 28 28 29  33* 31 34*  GLUCOSE  --    < > 102* 149* 135* 154* 133* 131*  BUN  --    < > 14 16 14 17 20 19   CREATININE  --    < > 0.92 0.91 0.91 1.04* 1.01* 0.95  CALCIUM  --    < > 8.2* 8.3* 8.0* 8.0* 8.0* 8.1*  MG 1.6*  --  1.8 1.9 1.5* 2.1 1.8 1.5*  PHOS 3.6  --  3.8 4.6 3.2  --   --   --    < > = values in this interval not displayed.   GFR: Estimated Creatinine Clearance: 58.5 mL/min (by C-G formula based on SCr of 0.95 mg/dL). Liver Function Tests: Recent Labs  Lab 06/16/22 0401 06/17/22 0358 06/18/22 0416 06/19/22 0415 06/20/22 0352  AST 17 20 46* 26 21  ALT 23 23 38 32 31  ALKPHOS 32* 33* 35* 35* 41  BILITOT 0.5 0.7 0.4 0.5 0.6  PROT 6.3* 6.4* 6.6 6.2* 6.7  ALBUMIN 2.9* 3.1* 3.0* 2.8* 2.9*   Recent Labs  Lab 06/13/22 0800 06/15/22 1015  LIPASE 3,094* 47   No results for input(s): "AMMONIA" in the last 168 hours. Coagulation Profile: No results for  input(s): "INR", "PROTIME" in the last 168 hours. Cardiac Enzymes: No results for input(s): "CKTOTAL", "CKMB", "CKMBINDEX", "TROPONINI" in the last 168 hours. BNP (last 3 results) No results  for input(s): "PROBNP" in the last 8760 hours. HbA1C: No results for input(s): "HGBA1C" in the last 72 hours. CBG: Recent Labs  Lab 06/17/22 1244  GLUCAP 107*   Lipid Profile: No results for input(s): "CHOL", "HDL", "LDLCALC", "TRIG", "CHOLHDL", "LDLDIRECT" in the last 72 hours. Thyroid Function Tests: No results for input(s): "TSH", "T4TOTAL", "FREET4", "T3FREE", "THYROIDAB" in the last 72 hours. Anemia Panel: No results for input(s): "VITAMINB12", "FOLATE", "FERRITIN", "TIBC", "IRON", "RETICCTPCT" in the last 72 hours. Sepsis Labs: No results for input(s): "PROCALCITON", "LATICACIDVEN" in the last 168 hours.  No results found for this or any previous visit (from the past 240 hour(s)).       Radiology Studies: DG Chest Port 1 View  Result Date: 06/19/2022 CLINICAL DATA:  Dyspnea, asthma, COPD, hypertension, smoking history EXAM: PORTABLE CHEST 1 VIEW COMPARISON:  Portable exam 0014 hours compared to 06/16/2022 FINDINGS: Enlargement of cardiac silhouette with pulmonary vascular congestion. Mediastinal contour stable. Perihilar interstitial infiltrates with mild atelectasis in the RIGHT mid lung. Elevation of RIGHT diaphragm. No pleural effusion or pneumothorax. Bones demineralized. IMPRESSION: Enlargement of cardiac silhouette with pulmonary vascular congestion. Perihilar infiltrates favor pulmonary edema with subsegmental atelectasis mid RIGHT lung. Electronically Signed   By: Ulyses Southward M.D.   On: 06/19/2022 08:42           LOS: 7 days   Time spent= 35 mins    Maurica Omura Joline Maxcy, MD [facility]  If 7PM-7AM, please contact night-coverage  06/20/2022, 7:59 AM

## 2022-06-20 NOTE — Plan of Care (Signed)
  Problem: Safety: Goal: Ability to remain free from injury will improve Outcome: Progressing   Problem: Pain Managment: Goal: General experience of comfort will improve Outcome: Progressing   Problem: Coping: Goal: Level of anxiety will decrease Outcome: Progressing   Problem: Clinical Measurements: Goal: Respiratory complications will improve Outcome: Progressing   

## 2022-06-20 NOTE — Progress Notes (Signed)
Mobility Specialist - Progress Note   06/20/22 1140  Oxygen Therapy  SpO2 91 %  O2 Device Nasal Cannula  O2 Flow Rate (L/min) 3 L/min  Patient Activity (if Appropriate) Ambulating  Mobility  Activity Ambulated with assistance in hallway  Level of Assistance Standby assist, set-up cues, supervision of patient - no hands on  Assistive Device Front wheel walker  Distance Ambulated (ft) 120 ft  Activity Response Tolerated well  Mobility Referral Yes  $Mobility charge 1 Mobility  Mobility Specialist Start Time (ACUTE ONLY) 1124  Mobility Specialist Stop Time (ACUTE ONLY) 1133  Mobility Specialist Time Calculation (min) (ACUTE ONLY) 9 min   Pt received in recliner and agreeable to mobility. Upon returning to room pt c/o feeling SOB despite O2 ar 92% & feeling like todays walk felt harder for her. No other complaints during session. Pt to recliner after session with all needs met.    Pre-mobility: 94%  SpO2 (3L [place]) During mobility: 91% SpO2 (3L [place]) Post-mobility: 92%  SPO2 (3L [place])  Chief Technology Officer

## 2022-06-21 DIAGNOSIS — K858 Other acute pancreatitis without necrosis or infection: Secondary | ICD-10-CM | POA: Diagnosis not present

## 2022-06-21 LAB — CBC
HCT: 40.2 % (ref 36.0–46.0)
Hemoglobin: 12.1 g/dL (ref 12.0–15.0)
MCH: 27 pg (ref 26.0–34.0)
MCHC: 30.1 g/dL (ref 30.0–36.0)
MCV: 89.7 fL (ref 80.0–100.0)
Platelets: 323 10*3/uL (ref 150–400)
RBC: 4.48 MIL/uL (ref 3.87–5.11)
RDW: 15.3 % (ref 11.5–15.5)
WBC: 10.1 10*3/uL (ref 4.0–10.5)
nRBC: 0 % (ref 0.0–0.2)

## 2022-06-21 LAB — COMPREHENSIVE METABOLIC PANEL
ALT: 25 U/L (ref 0–44)
AST: 18 U/L (ref 15–41)
Albumin: 3.1 g/dL — ABNORMAL LOW (ref 3.5–5.0)
Alkaline Phosphatase: 46 U/L (ref 38–126)
Anion gap: 10 (ref 5–15)
BUN: 19 mg/dL (ref 8–23)
CO2: 31 mmol/L (ref 22–32)
Calcium: 8.4 mg/dL — ABNORMAL LOW (ref 8.9–10.3)
Chloride: 100 mmol/L (ref 98–111)
Creatinine, Ser: 1.06 mg/dL — ABNORMAL HIGH (ref 0.44–1.00)
GFR, Estimated: 52 mL/min — ABNORMAL LOW (ref >60)
Glucose, Bld: 142 mg/dL — ABNORMAL HIGH (ref 70–99)
Potassium: 4.1 mmol/L (ref 3.5–5.1)
Sodium: 141 mmol/L (ref 135–145)
Total Bilirubin: 0.7 mg/dL (ref 0.3–1.2)
Total Protein: 7.1 g/dL (ref 6.5–8.1)

## 2022-06-21 LAB — MAGNESIUM: Magnesium: 1.8 mg/dL (ref 1.7–2.4)

## 2022-06-21 MED ORDER — SENNOSIDES-DOCUSATE SODIUM 8.6-50 MG PO TABS: 1.0000 | ORAL_TABLET | Freq: Every evening | ORAL | 0 refills | Status: DC | PRN

## 2022-06-21 MED ORDER — METOPROLOL TARTRATE 25 MG PO TABS: 25.0000 mg | ORAL_TABLET | Freq: Two times a day (BID) | ORAL | 0 refills | Status: DC

## 2022-06-21 NOTE — Discharge Summary (Signed)
Physician Discharge Summary  [name] WUJ:[id number] DOB: [birth year] DOA: 06/13/2022  PCP: [name], NP  Admit date: 06/13/2022 Discharge date: 06/21/2022  Admitted From: Home Disposition:  Home  Recommendations for Outpatient Follow-up:  Follow up with PCP in 1-2 weeks Please obtain BMP/CBC in one week your next doctors visit.  Pain meds with bowel regimen.  Home O2 2-3 Liters.    Discharge Condition: Stable CODE STATUS: Full  Diet recommendation: 2g Na  Brief/Interim Summary: 82 y.o. female with medical history significant of COPD, GERD, hypothyroidism, essential hypertension, hyperlipidemia, hyperlipidemia, asthma, history of pulmonary embolism on Eliquis who presented to med [facility] with chest and epigastric pain.  Found to have gallstone pancreatitis. S/p ERCP on 5/6.  General surgery following for possible laparoscopic cholecystectomy.  Postoperatively developed hypoxia secondary to atelectasis and fluid overload.  Postoperatively developed hypoxia secondary to atelectasis which is slowly improving with the use of incentive spirometer, flutter valve and mobility.  Today patient is medically stable for discharge. Daughter updated on the day of discharge   Assessment & Plan:  Principal Problem:   Acute pancreatitis Active Problems:   Hypothyroidism   Essential hypertension, benign   Mixed hyperlipidemia   COPD (chronic obstructive pulmonary disease) (HCC)   GERD without esophagitis   Pulmonary embolism (HCC)   OSA (obstructive sleep apnea)      Gallstone Pancreatitis  Choledocholithiasis  Cholelithiasis And initially lipase significantly elevated which is now trended down.  Status post ERCP 5/6.  General surgery following for possible laparoscopic cholecystectomy.   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 Class 3 Atelectasis  Acute hypoxia.  Some signs of volume overload, elevated BNP.  She had received Lasix IV IS/ flutter.  Now has atelactesis.  So mobility and incentive spirometer should help   Hypertension Continue losartan, metoprolol twice daily.    Minimal Change Disease  CKD  Tacrolimus   COPD As needed bronchodilators  Hx PE Outpatient Eliquis    Hypothyroidism Synthroid   Dyslipidemia statin   PT/OT= no f/u   Discharge Diagnoses:  Principal Problem:   Acute pancreatitis Active Problems:   Hypothyroidism   Essential hypertension, benign   Mixed hyperlipidemia   COPD (chronic obstructive pulmonary disease) (HCC)   GERD without esophagitis   Pulmonary embolism (HCC)   OSA (obstructive sleep apnea)      Consultations: Gen Surgery  Subjective: Feels well no complaints.   Discharge Exam: Vitals:   06/21/22 1050 06/21/22 1053  BP: (!) 179/86 (!) 179/86  Pulse: 66 66  Resp:  (!) 22  Temp:    SpO2:  94%   Vitals:   06/21/22 0655 06/21/22 0808 06/21/22 1050 06/21/22 1053  BP:   (!) 179/86 (!) 179/86  Pulse:   66 66  Resp:    (!) 22  Temp:      TempSrc:      SpO2:  96%  94%  Weight: 99.1 kg     Height:        General: Pt is alert, awake, not in acute distress Cardiovascular: RRR, S1/S2 +, no rubs, no gallops Respiratory: CTA bilaterally, no wheezing, no rhonchi Abdominal: Soft, NT, ND, bowel sounds + Extremities: no edema, no cyanosis  Discharge  Instructions   Allergies as of 06/21/2022   No Known Allergies      Medication List     TAKE these medications    acetaminophen 500 MG tablet Commonly known as: TYLENOL Take 2 tablets (1,000 mg total) by mouth every 6 (six) hours as needed.   albuterol 108 (90 Base) MCG/ACT inhaler Commonly known as: VENTOLIN HFA Inhale 1 puff into the lungs as  needed for wheezing or shortness of breath.   Azelastine HCl 0.15 % Soln Commonly known as: Astepro Place 1 spray into the nose every morning.   Calcium-Vitamin D 600-400 MG-UNIT Tabs Take 1 tablet by mouth daily.   citalopram 20 MG tablet Commonly known as: CELEXA TAKE 1 TABLET BY MOUTH EVERYDAY AT BEDTIME   donepezil 10 MG tablet Commonly known as: ARICEPT TAKE 1 TABLET BY MOUTH EVERYDAY AT BEDTIME   Eliquis 5 MG Tabs tablet Generic drug: apixaban TAKE 1 TABLET BY MOUTH TWICE A DAY   ferrous sulfate 325 (65 FE) MG EC tablet Take 1 tablet (325 mg total) by mouth daily with breakfast.   fluticasone 50 MCG/ACT nasal spray Commonly known as: FLONASE INSTILL 1 SPRAY INTO BOTH NOSTRILS DAILY   guaiFENesin 600 MG 12 hr tablet Commonly known as: MUCINEX Take 1,200 mg by mouth daily.   ipratropium 0.03 % nasal spray Commonly known as: ATROVENT Place 2 sprays into both nostrils 2 (two) times daily.   levothyroxine 88 MCG tablet Commonly known as: SYNTHROID TAKE 1 TABLET BY MOUTH EVERY DAY IN THE MORNING   losartan 100 MG tablet Commonly known as: COZAAR TAKE 1 TABLET BY MOUTH EVERY DAY   metFORMIN 500 MG tablet Commonly known as: GLUCOPHAGE Take 1 tablet (500 mg total) by mouth daily with supper.   metoprolol tartrate 25 MG tablet Commonly known as: LOPRESSOR Take 1 tablet (25 mg total) by mouth 2 (two) times daily.   montelukast 10 MG tablet Commonly known as: SINGULAIR TAKE 1 TABLET BY MOUTH EVERYDAY AT BEDTIME   multivitamin with minerals Tabs tablet Take 1 tablet by mouth daily.   oxyCODONE 5 MG immediate release tablet Commonly known as: Oxy IR/ROXICODONE Take 1 tablet (5 mg total) by mouth every 4 (four) hours as needed for moderate pain, severe pain or breakthrough pain.   pantoprazole 40 MG tablet Commonly known as: PROTONIX Take 1 tablet (40 mg total) by mouth daily.   rOPINIRole 1 MG tablet Commonly known as: REQUIP TAKE 1 TABLET BY MOUTH  EVERYDAY AT BEDTIME   rosuvastatin 10 MG tablet Commonly known as: CRESTOR Take 2 tablets (20 mg total) by mouth daily. E78.2   senna-docusate 8.6-50 MG tablet Commonly known as: Senokot-S Take 1 tablet by mouth at bedtime as needed for moderate constipation.   tacrolimus 1 MG capsule Commonly known as: PROGRAF Take 1 mg by mouth 2 (two) times daily.   Trelegy Ellipta 100-62.5-25 MCG/ACT Aepb Generic drug: Fluticasone-Umeclidin-Vilant TAKE 1 PUFF BY MOUTH EVERY DAY               Durable Medical Equipment  (From admission, onward)           Start     Ordered   06/21/22 1102  For home use only DME oxygen  Once       Question Answer Comment  Length of Need Lifetime   Mode or (Route) Nasal cannula   Liters per Minute 3   Oxygen delivery system Gas      06/21/22 1101  Follow-up Information     Maczis, Hedda Slade, PA-C Follow up on 07/14/2022.   Specialty: General Surgery Why: 11:30am, Arrive 30 minutes prior to your appointment time, Please bring your insurance card and photo ID Contact information: [address] [place] [address] [phone]                No Known Allergies  You were cared for by a hospitalist during your hospital stay. If you have any questions about your discharge medications or the care you received while you were in the hospital after you are discharged, you can call the unit and asked to speak with the hospitalist on call if the hospitalist that took care of you is not available. Once you are discharged, your primary care physician will handle any further medical issues. Please note that no refills for any discharge medications will be authorized once you are discharged, as it is imperative that you return to your primary care physician (or establish a relationship with a primary care physician if you do not have one) for your aftercare needs so that they can reassess your need for medications and monitor  your lab values.  You were cared for by a hospitalist during your hospital stay. If you have any questions about your discharge medications or the care you received while you were in the hospital after you are discharged, you can call the unit and asked to speak with the hospitalist on call if the hospitalist that took care of you is not available. Once you are discharged, your primary care physician will handle any further medical issues. Please note that NO REFILLS for any discharge medications will be authorized once you are discharged, as it is imperative that you return to your primary care physician (or establish a relationship with a primary care physician if you do not have one) for your aftercare needs so that they can reassess your need for medications and monitor your lab values.  Please request your Prim.MD to go over all Hospital Tests and Procedure/Radiological results at the follow up, please get all Hospital records sent to your Prim MD by signing hospital release before you go home.  Get CBC, CMP, 2 view Chest X ray checked  by Primary MD during your next visit or SNF MD in 5-7 days ( we routinely change or add medications that can affect your baseline labs and fluid status, therefore we recommend that you get the mentioned basic workup next visit with your PCP, your PCP may decide not to get them or add new tests based on their clinical decision)  On your next visit with your primary care physician please Get Medicines reviewed and adjusted.  If you experience worsening of your admission symptoms, develop shortness of breath, life threatening emergency, suicidal or homicidal thoughts you must seek medical attention immediately by calling 911 or calling your MD immediately  if symptoms less severe.  You Must read complete instructions/literature along with all the possible adverse reactions/side effects for all the Medicines you take and that have been prescribed to you. Take any new  Medicines after you have completely understood and accpet all the possible adverse reactions/side effects.   Do not drive, operate heavy machinery, perform activities at heights, swimming or participation in water activities or provide baby sitting services if your were admitted for syncope or siezures until you have seen by Primary MD or a Neurologist and advised to do so again.  Do not drive when  taking Pain medications.   Procedures/Studies: DG Chest Port 1 View  Result Date: 06/19/2022 CLINICAL DATA:  Dyspnea, asthma, COPD, hypertension, smoking history EXAM: PORTABLE CHEST 1 VIEW COMPARISON:  Portable exam 0014 hours compared to 06/16/2022 FINDINGS: Enlargement of cardiac silhouette with pulmonary vascular congestion. Mediastinal contour stable. Perihilar interstitial infiltrates with mild atelectasis in the RIGHT mid lung. Elevation of RIGHT diaphragm. No pleural effusion or pneumothorax. Bones demineralized. IMPRESSION: Enlargement of cardiac silhouette with pulmonary vascular congestion. Perihilar infiltrates favor pulmonary edema with subsegmental atelectasis mid RIGHT lung. Electronically Signed   By: Ulyses Southward M.D.   On: 06/19/2022 08:42   DG CHEST PORT 1 VIEW  Result Date: 06/16/2022 CLINICAL DATA:  Elevated BNP EXAM: PORTABLE CHEST 1 VIEW COMPARISON:  Portable exam 1440 hours compared to 06/13/2022 FINDINGS: Enlargement of cardiac silhouette with slight pulmonary vascular congestion. Mediastinal contours normal. Subsegmental atelectasis RIGHT base. No definite pulmonary infiltrate or edema. No pleural effusion, pneumothorax or acute osseous findings. Diffuse osseous demineralization noted. IMPRESSION: Enlargement of cardiac silhouette with pulmonary vascular congestion. Subsegmental atelectasis RIGHT base. Electronically Signed   By: Ulyses Southward M.D.   On: 06/16/2022 15:05   ECHOCARDIOGRAM COMPLETE  Result Date: 06/15/2022    ECHOCARDIOGRAM REPORT   Patient Name:   [name] Date of Exam: 06/15/2022 Medical Rec #:  [id number]              Height:       68.0 in Accession #:    [id number]             Weight:       230.6 lb Date of Birth:  10/22/1940              BSA:          2.171 m Patient Age:    81 years               BP:           162/71 mmHg Patient Gender: F                      HR:           62 bpm. Exam Location:  Inpatient Procedure: 2D Echo, Cardiac Doppler and Color Doppler Indications:    Abnormal EKG  History:        Patient has prior history of Echocardiogram examinations, most                 recent 04/11/2019. COPD and Acute Pancreatitis; Risk                 Factors:Hypertension, Dyslipidemia, Sleep Apnea and Diabetes.  Sonographer:    NA Referring Phys: [id number] A CALDWELL POWELL JR IMPRESSIONS  1. Left ventricular ejection fraction, by estimation, is 50 to 55%. The left ventricle has low normal function. The left ventricle has no regional wall motion abnormalities. There is mild concentric left ventricular hypertrophy. Left ventricular diastolic parameters are consistent with Grade I diastolic dysfunction (impaired relaxation).  2. Right ventricular systolic function is normal. The right ventricular size is normal. Tricuspid regurgitation signal is inadequate for assessing PA pressure.  3. Left atrial size was mildly dilated.  4. Right atrial size was mildly dilated.  5. The mitral valve is normal in structure. Trivial mitral valve regurgitation. No evidence of mitral stenosis.  6. The aortic valve is tricuspid. There is mild calcification of the aortic valve. Aortic valve regurgitation is not visualized.  Mild aortic valve stenosis. Aortic valve area, by VTI measures 1.67 cm. Aortic valve mean gradient measures 9.0 mmHg.  7. The inferior vena cava is dilated in size with <50% respiratory variability, suggesting right atrial pressure of 15 mmHg. FINDINGS  Left Ventricle: Left ventricular ejection fraction, by estimation, is 50 to 55%. The left ventricle has low  normal function. The left ventricle has no regional wall motion abnormalities. The left ventricular internal cavity size was normal in size. There is mild concentric left ventricular hypertrophy. Left ventricular diastolic parameters are consistent with Grade I diastolic dysfunction (impaired relaxation). Right Ventricle: The right ventricular size is normal. No increase in right ventricular wall thickness. Right ventricular systolic function is normal. Tricuspid regurgitation signal is inadequate for assessing PA pressure. Left Atrium: Left atrial size was mildly dilated. Right Atrium: Right atrial size was mildly dilated. Pericardium: There is no evidence of pericardial effusion. Mitral Valve: The mitral valve is normal in structure. There is mild calcification of the mitral valve leaflet(s). Mild mitral annular calcification. Trivial mitral valve regurgitation. No evidence of mitral valve stenosis. MV peak gradient, 5.7 mmHg. The mean mitral valve gradient is 3.0 mmHg. Tricuspid Valve: The tricuspid valve is normal in structure. Tricuspid valve regurgitation is not demonstrated. Aortic Valve: The aortic valve is tricuspid. There is mild calcification of the aortic valve. Aortic valve regurgitation is not visualized. Mild aortic stenosis is present. Aortic valve mean gradient measures 9.0 mmHg. Aortic valve peak gradient measures  15.2 mmHg. Aortic valve area, by VTI measures 1.67 cm. Pulmonic Valve: The pulmonic valve was normal in structure. Pulmonic valve regurgitation is not visualized. Aorta: The aortic root is normal in size and structure. Venous: The inferior vena cava is dilated in size with less than 50% respiratory variability, suggesting right atrial pressure of 15 mmHg. IAS/Shunts: No atrial level shunt detected by color flow Doppler.  LEFT VENTRICLE PLAX 2D LVIDd:         5.20 cm     Diastology LVIDs:         3.60 cm     LV e' medial:    8.05 cm/s LV PW:         1.20 cm     LV E/e' medial:  12.3 LV  IVS:        1.20 cm     LV e' lateral:   8.59 cm/s LVOT diam:     1.90 cm     LV E/e' lateral: 11.6 LV SV:         79 LV SV Index:   36 LVOT Area:     2.84 cm  LV Volumes (MOD) LV vol d, MOD A2C: 91.6 ml LV vol d, MOD A4C: 91.5 ml LV vol s, MOD A2C: 42.0 ml LV vol s, MOD A4C: 37.7 ml LV SV MOD A2C:     49.6 ml LV SV MOD A4C:     91.5 ml LV SV MOD BP:      52.5 ml RIGHT VENTRICLE             IVC RV Basal diam:  3.60 cm     IVC diam: 2.70 cm RV S prime:     15.00 cm/s TAPSE (M-mode): 3.0 cm LEFT ATRIUM             Index        RIGHT ATRIUM           Index LA diam:        3.80  cm 1.75 cm/m   RA Area:     21.30 cm LA Vol (A2C):   64.2 ml 29.57 ml/m  RA Volume:   61.70 ml  28.42 ml/m LA Vol (A4C):   69.3 ml 31.92 ml/m LA Biplane Vol: 68.2 ml 31.41 ml/m  AORTIC VALVE AV Area (Vmax):    1.68 cm AV Area (Vmean):   1.67 cm AV Area (VTI):     1.67 cm AV Vmax:           195.00 cm/s AV Vmean:          140.000 cm/s AV VTI:            0.471 m AV Peak Grad:      15.2 mmHg AV Mean Grad:      9.0 mmHg LVOT Vmax:         115.50 cm/s LVOT Vmean:        82.400 cm/s LVOT VTI:          0.277 m LVOT/AV VTI ratio: 0.59  AORTA Ao Root diam: 2.80 cm Ao Asc diam:  3.40 cm MITRAL VALVE MV Area (PHT): 3.45 cm     SHUNTS MV Area VTI:   1.86 cm     Systemic VTI:  0.28 m MV Peak grad:  5.7 mmHg     Systemic Diam: 1.90 cm MV Mean grad:  3.0 mmHg MV Vmax:       1.19 m/s MV Vmean:      72.8 cm/s MV Decel Time: 220 msec MV E velocity: 99.30 cm/s MV A velocity: 115.00 cm/s MV E/A ratio:  0.86 Dalton McleanMD Electronically signed by Wilfred Lacy Signature Date/Time: 06/15/2022/4:03:24 PM    Final    DG ERCP  Result Date: 06/15/2022 CLINICAL DATA:  Pancreatitis.  Concern for choledocholithiasis. EXAM: ERCP TECHNIQUE: Multiple spot images obtained with the fluoroscopic device and submitted for interpretation post-procedure. FLUOROSCOPY TIME: FLUOROSCOPY TIME 2.2 mGy COMPARISON:  CT abdomen and pelvis 06/13/2022 FINDINGS: Five spot  intraoperative fluoroscopic images of the right upper abdominal quadrant during ERCP are provided for review Initial image demonstrates an ERCP probe overlying the right upper abdominal quadrant. There is selective cannulation and faint opacification of the common bile duct which appears nondilated Subsequent images demonstrate insufflation of a balloon within the central aspect of the CBD with subsequent biliary sweeping and presumed sphincterotomy. There is faint opacification of the cystic duct. There is minimal opacification of the intrahepatic biliary tree which appears nondilated. There is no definitive opacification of the pancreatic duct. IMPRESSION: ERCP with biliary sweeping and presumed sphincterotomy as above. These images were submitted for radiologic interpretation only. Please see the procedural report for the amount of contrast and the fluoroscopy time utilized. Electronically Signed   By: Simonne Come M.D.   On: 06/15/2022 15:36   CT ABDOMEN PELVIS WO CONTRAST  Result Date: 06/13/2022 CLINICAL DATA:  Pancreatitis, acute, severe. EXAM: CT ABDOMEN AND PELVIS WITHOUT CONTRAST TECHNIQUE: Multidetector CT imaging of the abdomen and pelvis was performed following the standard protocol without IV contrast. RADIATION DOSE REDUCTION: This exam was performed according to the departmental dose-optimization program which includes automated exposure control, adjustment of the mA and/or kV according to patient size and/or use of iterative reconstruction technique. COMPARISON:  CTA chest 06/13/2022.  Lumbar spine CT 05/08/2017 FINDINGS: Lower chest: Lung bases are clear.  No pleural effusions. Hepatobiliary: At least 3 hypodense structures associated with the liver that probably represent cysts. One hypodensity is exophytic along the inferior right  hepatic lobe measuring up to 2.6 cm. Gallbladder is mildly distended with layering stones. Mild stranding near the base of the gallbladder. Common bile duct measures  roughly 8 mm but there is concern for 2 small calculi in the distal aspect of the common bile duct best seen on the coronal images, image 59/5. This finding is suggestive for choledocholithiasis. In addition, there may be a small stone near the cystic duct on image 28/2. Pancreas: Diffuse stranding around the pancreas compatible with acute pancreatitis. No significant pancreatic duct dilatation. Cannot evaluate for pancreatic necrosis on this exam without vascular contrast. Spleen: Spleen is normal for size.  No gross abnormality. Adrenals/Urinary Tract: Normal adrenal glands. Contrast in the renal collecting system compatible with recent chest CTA. Negative for hydronephrosis. No suspicious renal lesions. Normal appearance of the urinary bladder. Stomach/Bowel: Small hiatal hernia. Diverticula involving the sigmoid colon without acute inflammatory changes. No evidence for bowel obstruction. Vascular/Lymphatic: Aortic atherosclerotic calcifications without aneurysm. Retroaortic left renal vein which is a normal variant. No significant lymph node enlargement in the abdomen or pelvis. Reproductive: Status post hysterectomy. No adnexal masses. Other: Stranding and inflammatory changes centered around the pancreas and porta hepatis. No discrete fluid collections. No evidence for a pseudocyst formation at this time. No free fluid in the pelvis. Negative for free air. Small umbilical hernia containing fat. Musculoskeletal: Old compression fracture involving L1 vertebral body. Chronic disc space narrowing at L5-S1. No acute bone abnormality. IMPRESSION: 1. Acute pancreatitis with inflammatory changes centered around the pancreas and porta hepatis. Small calculi in the region of the distal common bile duct and findings could represent gallstone pancreatitis. 2. Cholelithiasis and probable choledocholithiasis. 3. Hepatic cysts. 4. Small hiatal hernia. 5. Aortic Atherosclerosis (ICD10-I70.0). Electronically Signed   By: Richarda Overlie M.D.   On: 06/13/2022 09:04   CT Angio Chest PE W and/or Wo Contrast  Result Date: 06/13/2022 CLINICAL DATA:  Pulmonary embolism suspected, high probability. Chest pain. EXAM: CT ANGIOGRAPHY CHEST WITH CONTRAST TECHNIQUE: Multidetector CT imaging of the chest was performed using the standard protocol during bolus administration of intravenous contrast. Multiplanar CT image reconstructions and MIPs were obtained to evaluate the vascular anatomy. RADIATION DOSE REDUCTION: This exam was performed according to the departmental dose-optimization program which includes automated exposure control, adjustment of the mA and/or kV according to patient size and/or use of iterative reconstruction technique. CONTRAST:  75mL OMNIPAQUE IOHEXOL 350 MG/ML SOLN COMPARISON:  07/14/2019 FINDINGS: Cardiovascular: Negative for pulmonary embolism. Heart size is prominent and left atrium appears to be enlarged measuring 5.0 cm in the AP dimension. No significant pericardial effusion. Small amount of calcium involving the LAD. Normal caliber of the thoracic aorta. Incidentally, the patient has a bovine type arch. Mediastinum/Nodes: Small hiatal hernia. No significant mediastinal or hilar lymph node enlargement. No axillary lymph node enlargement. Lungs/Pleura: Again noted is centrilobular emphysema. Chronic volume loss along the anterior aspect of the right minor fissure on image 73/6. Patchy dependent densities in both lungs particularly in the left lower lobe. Findings are suggestive for atelectasis. No pleural effusions. Upper Abdomen: Question stranding near the pancreatic tail and splenic hilum but this area is incompletely imaged. Musculoskeletal: No acute bone abnormality. Again noted is elevation of the right hemidiaphragm. Review of the MIP images confirms the above findings. IMPRESSION: 1. Negative for pulmonary embolism. 2. Question stranding near the pancreatic tail and splenic hilum. This area is incompletely imaged.  Recommend clinical correlation and consider further characterization with a dedicated CT of the abdomen  and pelvis. 3. Patchy dependent densities in both lungs are most compatible with atelectasis. 4. Enlarged left atrium. Consider further characterization with echocardiogram. 5. Small hiatal hernia. 6. Emphysema (ICD10-J43.9). Electronically Signed   By: Richarda Overlie M.D.   On: 06/13/2022 07:31   DG Chest Portable 1 View  Result Date: 06/13/2022 CLINICAL DATA:  Chest pain. EXAM: PORTABLE CHEST 1 VIEW COMPARISON:  Portable chest 02/11/2019 FINDINGS: There is mild-to-moderate cardiomegaly. No vascular congestion is seen. No pleural collections. Mild chronic elevation right hemidiaphragm. The lungs are clear. Stable mediastinum with aortic tortuosity and atherosclerosis. There is overlying monitor wiring. No acute osseous findings. Osteopenia. Compare: Unchanged. IMPRESSION: No acute chest findings. Stable chest with cardiomegaly and aortic uncoiling and atherosclerosis. Electronically Signed   By: Almira Bar M.D.   On: 06/13/2022 04:35   MR BRAIN W WO CONTRAST  Result Date: 06/07/2022 CLINICAL DATA:  Provided history: Acoustic neuroma. EXAM: MRI HEAD WITHOUT AND WITH CONTRAST TECHNIQUE: Multiplanar, multiecho pulse sequences of the brain and surrounding structures were obtained without and with intravenous contrast. CONTRAST:  10 mL Vueway intravenous contrast. COMPARISON:  Brain MRI 03/16/2021.  Head CT 05/08/2017. FINDINGS: Brain: No age advanced or lobar predominant parenchymal atrophy. Multifocal T2 FLAIR hyperintense signal abnormality within the cerebral white matter and pons, nonspecific but compatible with moderate chronic small vessel ischemic disease. Redemonstrated focus of chronic encephalomalacia/gliosis within the anterior left temporal lobe. Unremarkable appearance of the seventh and eighth cranial nerves on today's examination. Specifically, the small foci of enhancement within the bilateral  internal auditory canals described on the prior brain MRI of 03/16/2021 are not appreciated on today's study. No evidence of acute infarct. No chronic intracranial blood products. No extra-axial fluid collection. No midline shift. No pathologic intracranial enhancement identified. Vascular: Maintained flow voids within the proximal large arterial vessels. Skull and upper cervical spine: No focal suspicious marrow lesion. Sinuses/Orbits: No mass or acute finding within the imaged orbits. Prior bilateral ocular lens replacement. Mild mucosal thickening within the right maxillary sinus. Other: Small-volume fluid within the bilateral mastoid air cells (right greater than left). IMPRESSION: 1. Unremarkable appearance of the bilateral seventh and eighth cranial nerves on today's examination. Specifically, the small foci of enhancement within bilateral internal auditory canals described on the prior brain MRI of 03/16/2021 are not appreciated on today's study. This may have reflected prominent vascular enhancement on the prior MRI. 2. Otherwise stable MRI appearance of the brain. 3. Moderate chronic small vessel ischemic changes within the cerebral white matter and pons. 4. Focus of chronic encephalomalacia/gliosis within the anterior left temporal lobe. 5. Mild mucosal thickening within the right maxillary sinus. 6. Small-volume fluid within the bilateral mastoid air cells (right greater than left). Electronically Signed   By: Jackey Loge D.O.   On: 06/07/2022 11:07     The results of significant diagnostics from this hospitalization (including imaging, microbiology, ancillary and laboratory) are listed below for reference.     Microbiology: No results found for this or any previous visit (from the past 240 hour(s)).   Labs: BNP (last 3 results) Recent Labs    06/15/22 1651 06/19/22 0414  BNP 234.1* 139.2*   Basic Metabolic Panel: Recent Labs  Lab 06/15/22 0400 06/16/22 0401 06/17/22 0358  06/18/22 0416 06/19/22 0415 06/20/22 0352 06/21/22 0352  NA 137 134* 136 138 140 141 141  K 4.3 4.8 3.3* 4.8 3.9 3.6 4.1  CL 102 99 98 99 101 98 100  CO2 28 28 29  33* 31 34*  31  GLUCOSE 102* 149* 135* 154* 133* 131* 142*  BUN 14 16 14 17 20 19 19   CREATININE 0.92 0.91 0.91 1.04* 1.01* 0.95 1.06*  CALCIUM 8.2* 8.3* 8.0* 8.0* 8.0* 8.1* 8.4*  MG 1.8 1.9 1.5* 2.1 1.8 1.5* 1.8  PHOS 3.8 4.6 3.2  --   --   --   --    Liver Function Tests: Recent Labs  Lab 06/17/22 0358 06/18/22 0416 06/19/22 0415 06/20/22 0352 06/21/22 0352  AST 20 46* 26 21 18   ALT 23 38 32 31 25  ALKPHOS 33* 35* 35* 41 46  BILITOT 0.7 0.4 0.5 0.6 0.7  PROT 6.4* 6.6 6.2* 6.7 7.1  ALBUMIN 3.1* 3.0* 2.8* 2.9* 3.1*   Recent Labs  Lab 06/15/22 1015  LIPASE 47   No results for input(s): "AMMONIA" in the last 168 hours. CBC: Recent Labs  Lab 06/15/22 0400 06/16/22 0401 06/17/22 0358 06/18/22 0416 06/19/22 0415 06/20/22 0352 06/21/22 0352  WBC 11.1* 6.8 11.0* 11.3* 10.8* 9.8 10.1  NEUTROABS 7.7 5.6 8.0*  --   --   --   --   HGB 12.3 11.1* 10.9* 11.0* 10.9* 11.4* 12.1  HCT 40.6 37.2 36.2 36.8 36.8 38.6 40.2  MCV 91.4 90.7 90.0 91.5 92.5 90.6 89.7  PLT 242 221 241 235 225 273 323   Cardiac Enzymes: No results for input(s): "CKTOTAL", "CKMB", "CKMBINDEX", "TROPONINI" in the last 168 hours. BNP: Invalid input(s): "POCBNP" CBG: Recent Labs  Lab 06/17/22 1244  GLUCAP 107*   D-Dimer No results for input(s): "DDIMER" in the last 72 hours. Hgb A1c No results for input(s): "HGBA1C" in the last 72 hours. Lipid Profile No results for input(s): "CHOL", "HDL", "LDLCALC", "TRIG", "CHOLHDL", "LDLDIRECT" in the last 72 hours. Thyroid function studies No results for input(s): "TSH", "T4TOTAL", "T3FREE", "THYROIDAB" in the last 72 hours.  Invalid input(s): "FREET3" Anemia work up No results for input(s): "VITAMINB12", "FOLATE", "FERRITIN", "TIBC", "IRON", "RETICCTPCT" in the last 72 hours. Urinalysis     Component Value Date/Time   COLORURINE YELLOW 06/13/2022 0750   APPEARANCEUR CLEAR 06/13/2022 0750   LABSPEC >1.046 (H) 06/13/2022 0750   PHURINE 5.5 06/13/2022 0750   GLUCOSEU NEGATIVE 06/13/2022 0750   HGBUR NEGATIVE 06/13/2022 0750   BILIRUBINUR NEGATIVE 06/13/2022 0750   BILIRUBINUR Neg 01/12/2013 1604   KETONESUR NEGATIVE 06/13/2022 0750   PROTEINUR TRACE (A) 06/13/2022 0750   UROBILINOGEN 0.2 07/04/2014 1317   NITRITE NEGATIVE 06/13/2022 0750   LEUKOCYTESUR NEGATIVE 06/13/2022 0750   Sepsis Labs Recent Labs  Lab 06/18/22 0416 06/19/22 0415 06/20/22 0352 06/21/22 0352  WBC 11.3* 10.8* 9.8 10.1   Microbiology No results found for this or any previous visit (from the past 240 hour(s)).   Time coordinating discharge:  I have spent 35 minutes face to face with the patient and on the ward discussing the patients care, assessment, plan and disposition with other care givers. >50% of the time was devoted counseling the patient about the risks and benefits of treatment/Discharge disposition and coordinating care.   SIGNED:   Dimple Nanas, MD  [facility] 06/21/2022, 11:03 AM   If 7PM-7AM, please contact night-coverage

## 2022-06-21 NOTE — TOC Progression Note (Signed)
Transition of Care [facility]) - Progression Note    Patient Details  Name: [name] MRN: [id number] Date of Birth: 11-15-1940  [facility] CM/SW Contact  Georgie Chard, LCSW Phone Number: 06/21/2022, 1:06 PM  Clinical Narrative:    CSW has set up O2 with Jermaine at [employer]. At this time patient will wait on o2 to be delivered.        Expected Discharge Plan and Services         Expected Discharge Date: 06/21/22                                     Social Determinants of Health (SDOH) Interventions SDOH Screenings   Food Insecurity: No Food Insecurity (06/13/2022)  Housing: Low Risk  (06/13/2022)  Transportation Needs: No Transportation Needs (06/13/2022)  Utilities: Not At Risk (06/13/2022)  Depression (PHQ2-9): Low Risk  (06/04/2022)  Tobacco Use: Medium Risk (06/18/2022)    Readmission Risk Interventions     No data to display

## 2022-06-21 NOTE — Progress Notes (Signed)
Physical Therapy Treatment Patient Details Name: [name] MRN: [id number] DOB: 11-13-1940 Today's Date: 06/21/2022   History of Present Illness 82 y.o. female found to have gallstone pancreatitis. Pt is s/p ERCP on 5/6 and s/p lap chole 5/8. PMH: COPD, GERD, hypothyroidism, essential hypertension, hyperlipidemia, hyperlipidemia, asthma, history of pulmonary embolism on Eliquis    PT Comments    Pt pleasant and agreeable to ambulate.  Pt ambulated short distance in hallway limited by fatigue.  Pt's SpO2 mostly 91% on room air during session (RN notified).    Recommendations for follow up therapy are one component of a multi-disciplinary discharge planning process, led by the attending physician.  Recommendations may be updated based on patient status, additional functional criteria and insurance authorization.  Follow Up Recommendations       Assistance Recommended at Discharge Set up Supervision/Assistance  Patient can return home with the following A little help with bathing/dressing/bathroom;Assistance with cooking/housework;Assist for transportation;Help with stairs or ramp for entrance   Equipment Recommendations  None recommended by PT    Recommendations for Other Services       Precautions / Restrictions Precautions Precautions: Fall Precaution Comments: monitor SpO2     Mobility  Bed Mobility Overal bed mobility: Modified Independent                  Transfers Overall transfer level: Needs assistance Equipment used: Rolling walker (2 wheels) Transfers: Sit to/from Stand Sit to Stand: Supervision                Ambulation/Gait Ambulation/Gait assistance: Min guard, Supervision Gait Distance (Feet): 50 Feet Assistive device: Rolling walker (2 wheels) Gait Pattern/deviations: Step-through pattern, Decreased stride length Gait velocity: decreased     General Gait Details: cues for RW positioning, posture, slow pace, SPO2 91% on room  air   Stairs             Wheelchair Mobility    Modified Rankin (Stroke Patients Only)       Balance           Standing balance support: No upper extremity supported Standing balance-Leahy Scale: Fair Standing balance comment: static fair                            Cognition Arousal/Alertness: Awake/alert Behavior During Therapy: WFL for tasks assessed/performed Overall Cognitive Status: Within Functional Limits for tasks assessed                                          Exercises      General Comments        Pertinent Vitals/Pain Pain Assessment Pain Assessment: No/denies pain Pain Intervention(s): Monitored during session, Repositioned    Home Living                          Prior Function            PT Goals (current goals can now be found in the care plan section) Progress towards PT goals: Progressing toward goals    Frequency    Min 1X/week      PT Plan Current plan remains appropriate    Co-evaluation              AM-PAC PT "6 Clicks" Mobility   Outcome Measure  Help needed turning  from your back to your side while in a flat bed without using bedrails?: A Little Help needed moving from lying on your back to sitting on the side of a flat bed without using bedrails?: A Little Help needed moving to and from a bed to a chair (including a wheelchair)?: A Little Help needed standing up from a chair using your arms (e.g., wheelchair or bedside chair)?: A Little Help needed to walk in hospital room?: A Little Help needed climbing 3-5 steps with a railing? : A Lot 6 Click Score: 17    End of Session Equipment Utilized During Treatment: Gait belt Activity Tolerance: Patient tolerated treatment well Patient left: in chair;with call bell/phone within reach;with nursing/sitter in room Nurse Communication: Mobility status (aware pt ambulated on room air and left off O2, SPO2 91%) PT Visit  Diagnosis: Other abnormalities of gait and mobility (R26.89)     Time: 1035-1050 PT Time Calculation (min) (ACUTE ONLY): 15 min  Charges:  $Gait Training: 8-22 mins                     Paulino Door, DPT Physical Therapist Acute Rehabilitation Services Office: [phone]    [name] 06/21/2022, 3:38 PM

## 2022-06-22 ENCOUNTER — Other Ambulatory Visit: Payer: Self-pay | Admitting: Nurse Practitioner

## 2022-06-22 ENCOUNTER — Telehealth: Payer: Self-pay

## 2022-06-22 ENCOUNTER — Ambulatory Visit (INDEPENDENT_AMBULATORY_CARE_PROVIDER_SITE_OTHER): Payer: Medicare PPO | Admitting: Adult Health

## 2022-06-22 ENCOUNTER — Encounter: Payer: Self-pay | Admitting: Adult Health

## 2022-06-22 ENCOUNTER — Ambulatory Visit: Payer: Medicare PPO

## 2022-06-22 VITALS — BP 138/78 | HR 75 | Temp 97.5°F | Resp 18 | Ht 68.0 in | Wt 217.2 lb

## 2022-06-22 DIAGNOSIS — R5381 Other malaise: Secondary | ICD-10-CM

## 2022-06-22 DIAGNOSIS — E034 Atrophy of thyroid (acquired): Secondary | ICD-10-CM

## 2022-06-22 DIAGNOSIS — I2782 Chronic pulmonary embolism: Secondary | ICD-10-CM

## 2022-06-22 DIAGNOSIS — I2609 Other pulmonary embolism with acute cor pulmonale: Secondary | ICD-10-CM | POA: Diagnosis not present

## 2022-06-22 DIAGNOSIS — K219 Gastro-esophageal reflux disease without esophagitis: Secondary | ICD-10-CM | POA: Diagnosis not present

## 2022-06-22 DIAGNOSIS — J449 Chronic obstructive pulmonary disease, unspecified: Secondary | ICD-10-CM

## 2022-06-22 DIAGNOSIS — K859 Acute pancreatitis without necrosis or infection, unspecified: Secondary | ICD-10-CM

## 2022-06-22 DIAGNOSIS — G2581 Restless legs syndrome: Secondary | ICD-10-CM

## 2022-06-22 DIAGNOSIS — I4719 Other supraventricular tachycardia: Secondary | ICD-10-CM

## 2022-06-22 DIAGNOSIS — I2699 Other pulmonary embolism without acute cor pulmonale: Secondary | ICD-10-CM | POA: Diagnosis not present

## 2022-06-22 DIAGNOSIS — G4733 Obstructive sleep apnea (adult) (pediatric): Secondary | ICD-10-CM | POA: Diagnosis not present

## 2022-06-22 LAB — CBC
Hemoglobin: 13.7 g/dL (ref 11.7–15.5)
Platelets: 382 10*3/uL (ref 140–400)
RDW: 14.4 % (ref 11.0–15.0)
WBC: 10.7 10*3/uL (ref 3.8–10.8)

## 2022-06-22 NOTE — Progress Notes (Signed)
[name]  Provider:  Kenard Gower DNP  Code Status:  Full Code  Goals of Care:     06/13/2022    6:23 PM  Advanced Directives  Does Patient Have a Medical Advance Directive? Yes  Type of Advance Directive Living will  Does patient want to make changes to medical advance directive? No - Guardian declined  Copy of Healthcare Power of Attorney in Chart? No - copy requested     Chief Complaint  Patient presents with   Transitions Of Care    follow up after hospital stay with referral to cardio needed     HPI: Patient is a 82 y.o. female seen today for hospitalization follow up. She was hospitalized on  06/13/22 to 06/21/22 for acute pancreatitis. She has a PMH of COPD, GERD, hypothyroidism, essential hypertension, hyperlipidemia, asthma and history of pulmonary embolism on Eliquis. She was having epigastric pain and chest pain on presentation to hospital. She was found to have gallstone pancreatitis for which she had ERCP on 06/15/22 and for possible laparoscopic cholecystectomy. Post op was complicated by hypoxia secondary to atelectasis and fluid overload which improved with the use of incentive spirometer, flutter valve and mobility.   Past Medical History:  Diagnosis Date   Actinic keratosis    Arthritis    Asthma    Back injury 2019   Fracture   Basal cell carcinoma    COPD (chronic obstructive pulmonary disease) (HCC)    GERD (gastroesophageal reflux disease)    H/O blood clots 2020   Lung, Per [facility] New Patient Packet   H/O mammogram 2022   Per [facility] New Patient Packet   Heart murmur    hx of in childhood    High cholesterol    Hyperlipidemia    Hypertension    Hypothyroidism    Kidney disease    Per [facility] New Patient Packet   MCNS (minimal change nephrotic syndrome)    OSA (obstructive sleep apnea) 08/29/2019   Osteopenia    Shortness of breath dyspnea    on exertion    Squamous cell carcinoma    Thyroid disease    Per [facility] New Patient Packet    Past  Surgical History:  Procedure Laterality Date   ABDOMINAL HYSTERECTOMY     APPENDECTOMY     BILATERAL SALPINGOOPHORECTOMY     Ovarian Cysts    CHOLECYSTECTOMY N/A 06/17/2022   Procedure: LAPAROSCOPIC CHOLECYSTECTOMY;  Surgeon: Fritzi Mandes, MD;  Location: [facility];  Service: General;  Laterality: N/A;   COLONOSCOPY  2001   Dr.Mann, Per [facility] New Patient Packet   CONVERSION TO TOTAL KNEE Right 07/10/2014   Procedure: RIGHT CONVERSION OF PARTIAL KNEE TO TOTAL KNEE;  Surgeon: [name], MD;  Location: [facility];  Service: Orthopedics;  Laterality: Right;   ENDOSCOPIC RETROGRADE CHOLANGIOPANCREATOGRAPHY (ERCP) WITH PROPOFOL N/A 06/15/2022   Procedure: ENDOSCOPIC RETROGRADE CHOLANGIOPANCREATOGRAPHY (ERCP) WITH PROPOFOL;  Surgeon: Vida Rigger, MD;  Location: [facility];  Service: Gastroenterology;  Laterality: N/A;   IR ANGIOGRAM PULMONARY BILATERAL SELECTIVE  02/08/2019   IR ANGIOGRAM SELECTIVE EACH ADDITIONAL VESSEL  02/08/2019   IR ANGIOGRAM SELECTIVE EACH ADDITIONAL VESSEL  02/08/2019   IR INFUSION THROMBOL ARTERIAL INITIAL (MS)  02/08/2019   IR INFUSION THROMBOL ARTERIAL INITIAL (MS)  02/08/2019   IR THROMB F/U EVAL ART/VEN FINAL DAY (MS)  02/09/2019   IR US GUIDE VASC ACCESS RIGHT  02/08/2019   MEDIAL PARTIAL KNEE REPLACEMENT Bilateral    MOHS SURGERY  x 2   REMOVAL OF STONES  06/15/2022   Procedure: REMOVAL OF STONES;  Surgeon: Vida Rigger, MD;  Location: [facility];  Service: Gastroenterology;;   RENAL BIOPSY     x 2   REPLACEMENT TOTAL KNEE  2012   Per [facility] New Patient Packet   ROTATOR CUFF REPAIR Left 2008   SPHINCTEROTOMY  06/15/2022   Procedure: SPHINCTEROTOMY;  Surgeon: Vida Rigger, MD;  Location: [facility];  Service: Gastroenterology;;    No Known Allergies  Outpatient Encounter Medications as of 06/22/2022  Medication Sig   acetaminophen (TYLENOL) 500 MG tablet Take 2 tablets (1,000 mg total) by mouth every 6 (six) hours as needed.   albuterol (VENTOLIN HFA) 108 (90  Base) MCG/ACT inhaler Inhale 1 puff into the lungs as needed for wheezing or shortness of breath.   Calcium Carb-Cholecalciferol (CALCIUM-VITAMIN D) 600-400 MG-UNIT TABS Take 1 tablet by mouth daily.   citalopram (CELEXA) 20 MG tablet TAKE 1 TABLET BY MOUTH EVERYDAY AT BEDTIME   donepezil (ARICEPT) 10 MG tablet TAKE 1 TABLET BY MOUTH EVERYDAY AT BEDTIME   ELIQUIS 5 MG TABS tablet TAKE 1 TABLET BY MOUTH TWICE A DAY   ferrous sulfate 325 (65 FE) MG EC tablet Take 1 tablet (325 mg total) by mouth daily with breakfast.   fluticasone (FLONASE) 50 MCG/ACT nasal spray INSTILL 1 SPRAY INTO BOTH NOSTRILS DAILY   Fluticasone-Umeclidin-Vilant (TRELEGY ELLIPTA) 100-62.5-25 MCG/ACT AEPB TAKE 1 PUFF BY MOUTH EVERY DAY   guaiFENesin (MUCINEX) 600 MG 12 hr tablet Take 1,200 mg by mouth daily.   ipratropium (ATROVENT) 0.03 % nasal spray Place 2 sprays into both nostrils 2 (two) times daily.   levothyroxine (SYNTHROID) 88 MCG tablet TAKE 1 TABLET BY MOUTH EVERY DAY IN THE MORNING   losartan (COZAAR) 100 MG tablet TAKE 1 TABLET BY MOUTH EVERY DAY   metFORMIN (GLUCOPHAGE) 500 MG tablet Take 1 tablet (500 mg total) by mouth daily with supper.   metoprolol tartrate (LOPRESSOR) 25 MG tablet Take 1 tablet (25 mg total) by mouth 2 (two) times daily.   montelukast (SINGULAIR) 10 MG tablet TAKE 1 TABLET BY MOUTH EVERYDAY AT BEDTIME   Multiple Vitamin (MULTIVITAMIN WITH MINERALS) TABS tablet Take 1 tablet by mouth daily.   pantoprazole (PROTONIX) 40 MG tablet Take 1 tablet (40 mg total) by mouth daily.   rosuvastatin (CRESTOR) 10 MG tablet Take 2 tablets (20 mg total) by mouth daily. E78.2   tacrolimus (PROGRAF) 1 MG capsule Take 1 mg by mouth 2 (two) times daily.    [DISCONTINUED] rOPINIRole (REQUIP) 1 MG tablet TAKE 1 TABLET BY MOUTH EVERYDAY AT BEDTIME   [DISCONTINUED] Azelastine HCl (ASTEPRO) 0.15 % SOLN Place 1 spray into the nose every morning. (Patient not taking: Reported on 06/13/2022)   [DISCONTINUED] oxyCODONE  (OXY IR/ROXICODONE) 5 MG immediate release tablet Take 1 tablet (5 mg total) by mouth every 4 (four) hours as needed for moderate pain, severe pain or breakthrough pain.   [DISCONTINUED] senna-docusate (SENOKOT-S) 8.6-50 MG tablet Take 1 tablet by mouth at bedtime as needed for moderate constipation.   No facility-administered encounter medications on file as of 06/22/2022.    Review of Systems:  Review of Systems  Constitutional:  Negative for appetite change, chills, fatigue and fever.  HENT:  Negative for congestion, hearing loss, rhinorrhea and sore throat.   Eyes: Negative.   Respiratory:  Negative for cough, shortness of breath and wheezing.   Cardiovascular:  Negative for chest pain, palpitations and leg swelling.  Gastrointestinal:  Negative for abdominal pain, constipation, diarrhea, nausea and vomiting.  Genitourinary:  Negative for dysuria.  Musculoskeletal:  Negative for arthralgias, back pain and myalgias.  Skin:  Negative for color change, rash and wound.  Neurological:  Negative for dizziness, weakness and headaches.  Psychiatric/Behavioral:  Negative for behavioral problems. The patient is not nervous/anxious.     Health Maintenance  Topic Date Due   DTaP/Tdap/Td (2 - Td or Tdap) 12/08/2020   OPHTHALMOLOGY EXAM  07/23/2021   FOOT EXAM  03/10/2022   COVID-19 Vaccine (4 - 2023-24 season) 02/10/2023 (Originally 10/10/2021)   Diabetic kidney evaluation - Urine ACR  01/27/2027 (Originally 07/07/2013)   HEMOGLOBIN A1C  07/21/2022   INFLUENZA VACCINE  09/10/2022   Medicare Annual Wellness (AWV)  06/04/2023   Diabetic kidney evaluation - eGFR measurement  06/22/2023   Pneumonia Vaccine 4+ Years old  Completed   DEXA SCAN  Completed   Zoster Vaccines- Shingrix  Completed   HPV VACCINES  Aged Out    Physical Exam: Vitals:   06/22/22 1436  BP: 138/78  Pulse: 75  Resp: 18  Temp: (!) 97.5 F (36.4 C)  SpO2: 92%  Weight: 217 lb 4 oz (98.5 kg)  Height: 5\' 8"  (1.727 m)    Body mass index is 33.03 kg/m. Physical Exam Constitutional:      Appearance: She is obese.  HENT:     Head: Normocephalic and atraumatic.     Nose: Nose normal.     Mouth/Throat:     Mouth: Mucous membranes are moist.  Eyes:     Conjunctiva/sclera: Conjunctivae normal.  Cardiovascular:     Rate and Rhythm: Normal rate and regular rhythm.  Pulmonary:     Effort: Pulmonary effort is normal.     Breath sounds: Normal breath sounds.  Abdominal:     General: Bowel sounds are normal.     Palpations: Abdomen is soft.  Musculoskeletal:        General: Normal range of motion.     Cervical back: Normal range of motion.  Skin:    General: Skin is warm and dry.  Neurological:     General: No focal deficit present.     Mental Status: She is alert and oriented to person, place, and time.  Psychiatric:        Mood and Affect: Mood normal.        Behavior: Behavior normal.        Thought Content: Thought content normal.        Judgment: Judgment normal.     Labs reviewed: Basic Metabolic Panel: Recent Labs    01/19/22 0851 06/13/22 0409 06/15/22 0400 06/16/22 0401 06/17/22 0358 06/18/22 0416 06/19/22 0415 06/20/22 0352 06/21/22 0352 06/22/22 1521  NA 141   < > 137 134* 136   < > 140 141 141 143  K 4.4   < > 4.3 4.8 3.3*   < > 3.9 3.6 4.1 4.2  CL 105   < > 102 99 98   < > 101 98 100 103  CO2 30   < > 28 28 29    < > 31 34* 31 27  GLUCOSE 137*   < > 102* 149* 135*   < > 133* 131* 142* 132*  BUN 17   < > 14 16 14    < > 20 19 19 20   CREATININE 0.98*   < > 0.92 0.91 0.91   < > 1.01* 0.95 1.06* 0.96*  CALCIUM 8.3*   < >  8.2* 8.3* 8.0*   < > 8.0* 8.1* 8.4* 9.3  MG  --    < > 1.8 1.9 1.5*   < > 1.8 1.5* 1.8  --   PHOS  --    < > 3.8 4.6 3.2  --   --   --   --   --   TSH 2.68  --   --   --   --   --   --   --   --   --    < > = values in this interval not displayed.   Liver Function Tests: Recent Labs    06/19/22 0415 06/20/22 0352 06/21/22 0352  AST 26 21 18   ALT 32  31 25  ALKPHOS 35* 41 46  BILITOT 0.5 0.6 0.7  PROT 6.2* 6.7 7.1  ALBUMIN 2.8* 2.9* 3.1*   Recent Labs    06/13/22 0800 06/15/22 1015  LIPASE 3,094* 47   No results for input(s): "AMMONIA" in the last 8760 hours. CBC: Recent Labs    06/15/22 0400 06/16/22 0401 06/17/22 0358 06/18/22 0416 06/20/22 0352 06/21/22 0352 06/22/22 1521  WBC 11.1* 6.8 11.0*   < > 9.8 10.1 10.7  NEUTROABS 7.7 5.6 8.0*  --   --   --   --   HGB 12.3 11.1* 10.9*   < > 11.4* 12.1 13.7  HCT 40.6 37.2 36.2   < > 38.6 40.2 42.8  MCV 91.4 90.7 90.0   < > 90.6 89.7 84.4  PLT 242 221 241   < > 273 323 382   < > = values in this interval not displayed.   Lipid Panel: Recent Labs    01/19/22 0851  CHOL 125  HDL 63  LDLCALC 39  TRIG 145  CHOLHDL 2.0   Lab Results  Component Value Date   HGBA1C 6.6 (H) 01/19/2022    Procedures since last visit: DG Chest Port 1 View  Result Date: 06/19/2022 CLINICAL DATA:  Dyspnea, asthma, COPD, hypertension, smoking history EXAM: PORTABLE CHEST 1 VIEW COMPARISON:  Portable exam 0014 hours compared to 06/16/2022 FINDINGS: Enlargement of cardiac silhouette with pulmonary vascular congestion. Mediastinal contour stable. Perihilar interstitial infiltrates with mild atelectasis in the RIGHT mid lung. Elevation of RIGHT diaphragm. No pleural effusion or pneumothorax. Bones demineralized. IMPRESSION: Enlargement of cardiac silhouette with pulmonary vascular congestion. Perihilar infiltrates favor pulmonary edema with subsegmental atelectasis mid RIGHT lung. Electronically Signed   By: Ulyses Southward M.D.   On: 06/19/2022 08:42   DG CHEST PORT 1 VIEW  Result Date: 06/16/2022 CLINICAL DATA:  Elevated BNP EXAM: PORTABLE CHEST 1 VIEW COMPARISON:  Portable exam 1440 hours compared to 06/13/2022 FINDINGS: Enlargement of cardiac silhouette with slight pulmonary vascular congestion. Mediastinal contours normal. Subsegmental atelectasis RIGHT base. No definite pulmonary infiltrate or  edema. No pleural effusion, pneumothorax or acute osseous findings. Diffuse osseous demineralization noted. IMPRESSION: Enlargement of cardiac silhouette with pulmonary vascular congestion. Subsegmental atelectasis RIGHT base. Electronically Signed   By: Ulyses Southward M.D.   On: 06/16/2022 15:05   ECHOCARDIOGRAM COMPLETE  Result Date: 06/15/2022    ECHOCARDIOGRAM REPORT   Patient Name:   [name] Date of Exam: 06/15/2022 Medical Rec #:  [id number]              Height:       68.0 in Accession #:    [id number]             Weight:  230.6 lb Date of Birth:  [birth year]              BSA:          2.171 m Patient Age:    81 years               BP:           162/71 mmHg Patient Gender: F                      HR:           62 bpm. Exam Location:  Inpatient Procedure: 2D Echo, Cardiac Doppler and Color Doppler Indications:    Abnormal EKG  History:        Patient has prior history of Echocardiogram examinations, most                 recent 04/11/2019. COPD and Acute Pancreatitis; Risk                 Factors:Hypertension, Dyslipidemia, Sleep Apnea and Diabetes.  Sonographer:    NA Referring Phys: [id number] CALDWELL POWELL JR IMPRESSIONS  1. Left ventricular ejection fraction, by estimation, is 50 to 55%. The left ventricle has low normal function. The left ventricle has no regional wall motion abnormalities. There is mild concentric left ventricular hypertrophy. Left ventricular diastolic parameters are consistent with Grade I diastolic dysfunction (impaired relaxation).  2. Right ventricular systolic function is normal. The right ventricular size is normal. Tricuspid regurgitation signal is inadequate for assessing PA pressure.  3. Left atrial size was mildly dilated.  4. Right atrial size was mildly dilated.  5. The mitral valve is normal in structure. Trivial mitral valve regurgitation. No evidence of mitral stenosis.  6. The aortic valve is tricuspid. There is mild calcification of the aortic valve. Aortic  valve regurgitation is not visualized. Mild aortic valve stenosis. Aortic valve area, by VTI measures 1.67 cm. Aortic valve mean gradient measures 9.0 mmHg.  7. The inferior vena cava is dilated in size with <50% respiratory variability, suggesting right atrial pressure of 15 mmHg. FINDINGS  Left Ventricle: Left ventricular ejection fraction, by estimation, is 50 to 55%. The left ventricle has low normal function. The left ventricle has no regional wall motion abnormalities. The left ventricular internal cavity size was normal in size. There is mild concentric left ventricular hypertrophy. Left ventricular diastolic parameters are consistent with Grade I diastolic dysfunction (impaired relaxation). Right Ventricle: The right ventricular size is normal. No increase in right ventricular wall thickness. Right ventricular systolic function is normal. Tricuspid regurgitation signal is inadequate for assessing PA pressure. Left Atrium: Left atrial size was mildly dilated. Right Atrium: Right atrial size was mildly dilated. Pericardium: There is no evidence of pericardial effusion. Mitral Valve: The mitral valve is normal in structure. There is mild calcification of the mitral valve leaflet(s). Mild mitral annular calcification. Trivial mitral valve regurgitation. No evidence of mitral valve stenosis. MV peak gradient, 5.7 mmHg. The mean mitral valve gradient is 3.0 mmHg. Tricuspid Valve: The tricuspid valve is normal in structure. Tricuspid valve regurgitation is not demonstrated. Aortic Valve: The aortic valve is tricuspid. There is mild calcification of the aortic valve. Aortic valve regurgitation is not visualized. Mild aortic stenosis is present. Aortic valve mean gradient measures 9.0 mmHg. Aortic valve peak gradient measures  15.2 mmHg. Aortic valve area, by VTI measures 1.67 cm. Pulmonic Valve: The pulmonic valve was normal in structure. Pulmonic valve regurgitation is  not visualized. Aorta: The aortic root is  normal in size and structure. Venous: The inferior vena cava is dilated in size with less than 50% respiratory variability, suggesting right atrial pressure of 15 mmHg. IAS/Shunts: No atrial level shunt detected by color flow Doppler.  LEFT VENTRICLE PLAX 2D LVIDd:         5.20 cm     Diastology LVIDs:         3.60 cm     LV e' medial:    8.05 cm/s LV PW:         1.20 cm     LV E/e' medial:  12.3 LV IVS:        1.20 cm     LV e' lateral:   8.59 cm/s LVOT diam:     1.90 cm     LV E/e' lateral: 11.6 LV SV:         79 LV SV Index:   36 LVOT Area:     2.84 cm  LV Volumes (MOD) LV vol d, MOD A2C: 91.6 ml LV vol d, MOD A4C: 91.5 ml LV vol s, MOD A2C: 42.0 ml LV vol s, MOD A4C: 37.7 ml LV SV MOD A2C:     49.6 ml LV SV MOD A4C:     91.5 ml LV SV MOD BP:      52.5 ml RIGHT VENTRICLE             IVC RV Basal diam:  3.60 cm     IVC diam: 2.70 cm RV S prime:     15.00 cm/s TAPSE (M-mode): 3.0 cm LEFT ATRIUM             Index        RIGHT ATRIUM           Index LA diam:        3.80 cm 1.75 cm/m   RA Area:     21.30 cm LA Vol (A2C):   64.2 ml 29.57 ml/m  RA Volume:   61.70 ml  28.42 ml/m LA Vol (A4C):   69.3 ml 31.92 ml/m LA Biplane Vol: 68.2 ml 31.41 ml/m  AORTIC VALVE AV Area (Vmax):    1.68 cm AV Area (Vmean):   1.67 cm AV Area (VTI):     1.67 cm AV Vmax:           195.00 cm/s AV Vmean:          140.000 cm/s AV VTI:            0.471 m AV Peak Grad:      15.2 mmHg AV Mean Grad:      9.0 mmHg LVOT Vmax:         115.50 cm/s LVOT Vmean:        82.400 cm/s LVOT VTI:          0.277 m LVOT/AV VTI ratio: 0.59  AORTA Ao Root diam: 2.80 cm Ao Asc diam:  3.40 cm MITRAL VALVE MV Area (PHT): 3.45 cm     SHUNTS MV Area VTI:   1.86 cm     Systemic VTI:  0.28 m MV Peak grad:  5.7 mmHg     Systemic Diam: 1.90 cm MV Mean grad:  3.0 mmHg MV Vmax:       1.19 m/s MV Vmean:      72.8 cm/s MV Decel Time: 220 msec MV E velocity: 99.30 cm/s MV A velocity: 115.00 cm/s MV E/A ratio:  0.86  [employer] Electronically signed by Wilfred Lacy Signature Date/Time: 06/15/2022/4:03:24 PM    Final    DG ERCP  Result Date: 06/15/2022 CLINICAL DATA:  Pancreatitis.  Concern for choledocholithiasis. EXAM: ERCP TECHNIQUE: Multiple spot images obtained with the fluoroscopic device and submitted for interpretation post-procedure. FLUOROSCOPY TIME: FLUOROSCOPY TIME 2.2 mGy COMPARISON:  CT abdomen and pelvis 06/13/2022 FINDINGS: Five spot intraoperative fluoroscopic images of the right upper abdominal quadrant during ERCP are provided for review Initial image demonstrates an ERCP probe overlying the right upper abdominal quadrant. There is selective cannulation and faint opacification of the common bile duct which appears nondilated Subsequent images demonstrate insufflation of a balloon within the central aspect of the CBD with subsequent biliary sweeping and presumed sphincterotomy. There is faint opacification of the cystic duct. There is minimal opacification of the intrahepatic biliary tree which appears nondilated. There is no definitive opacification of the pancreatic duct. IMPRESSION: ERCP with biliary sweeping and presumed sphincterotomy as above. These images were submitted for radiologic interpretation only. Please see the procedural report for the amount of contrast and the fluoroscopy time utilized. Electronically Signed   By: Simonne Come M.D.   On: 06/15/2022 15:36   CT ABDOMEN PELVIS WO CONTRAST  Result Date: 06/13/2022 CLINICAL DATA:  Pancreatitis, acute, severe. EXAM: CT ABDOMEN AND PELVIS WITHOUT CONTRAST TECHNIQUE: Multidetector CT imaging of the abdomen and pelvis was performed following the standard protocol without IV contrast. RADIATION DOSE REDUCTION: This exam was performed according to the departmental dose-optimization program which includes automated exposure control, adjustment of the mA and/or kV according to patient size and/or use of iterative reconstruction technique. COMPARISON:  CTA chest 06/13/2022.  Lumbar spine CT  05/08/2017 FINDINGS: Lower chest: Lung bases are clear.  No pleural effusions. Hepatobiliary: At least 3 hypodense structures associated with the liver that probably represent cysts. One hypodensity is exophytic along the inferior right hepatic lobe measuring up to 2.6 cm. Gallbladder is mildly distended with layering stones. Mild stranding near the base of the gallbladder. Common bile duct measures roughly 8 mm but there is concern for 2 small calculi in the distal aspect of the common bile duct best seen on the coronal images, image 59/5. This finding is suggestive for choledocholithiasis. In addition, there may be a small stone near the cystic duct on image 28/2. Pancreas: Diffuse stranding around the pancreas compatible with acute pancreatitis. No significant pancreatic duct dilatation. Cannot evaluate for pancreatic necrosis on this exam without vascular contrast. Spleen: Spleen is normal for size.  No gross abnormality. Adrenals/Urinary Tract: Normal adrenal glands. Contrast in the renal collecting system compatible with recent chest CTA. Negative for hydronephrosis. No suspicious renal lesions. Normal appearance of the urinary bladder. Stomach/Bowel: Small hiatal hernia. Diverticula involving the sigmoid colon without acute inflammatory changes. No evidence for bowel obstruction. Vascular/Lymphatic: Aortic atherosclerotic calcifications without aneurysm. Retroaortic left renal vein which is a normal variant. No significant lymph node enlargement in the abdomen or pelvis. Reproductive: Status post hysterectomy. No adnexal masses. Other: Stranding and inflammatory changes centered around the pancreas and porta hepatis. No discrete fluid collections. No evidence for a pseudocyst formation at this time. No free fluid in the pelvis. Negative for free air. Small umbilical hernia containing fat. Musculoskeletal: Old compression fracture involving L1 vertebral body. Chronic disc space narrowing at L5-S1. No acute  bone abnormality. IMPRESSION: 1. Acute pancreatitis with inflammatory changes centered around the pancreas and porta hepatis. Small calculi in the region of the distal common bile duct and findings  could represent gallstone pancreatitis. 2. Cholelithiasis and probable choledocholithiasis. 3. Hepatic cysts. 4. Small hiatal hernia. 5. Aortic Atherosclerosis (ICD10-I70.0). Electronically Signed   By: Richarda Overlie M.D.   On: 06/13/2022 09:04   CT Angio Chest PE W and/or Wo Contrast  Result Date: 06/13/2022 CLINICAL DATA:  Pulmonary embolism suspected, high probability. Chest pain. EXAM: CT ANGIOGRAPHY CHEST WITH CONTRAST TECHNIQUE: Multidetector CT imaging of the chest was performed using the standard protocol during bolus administration of intravenous contrast. Multiplanar CT image reconstructions and MIPs were obtained to evaluate the vascular anatomy. RADIATION DOSE REDUCTION: This exam was performed according to the departmental dose-optimization program which includes automated exposure control, adjustment of the mA and/or kV according to patient size and/or use of iterative reconstruction technique. CONTRAST:  75mL OMNIPAQUE IOHEXOL 350 MG/ML SOLN COMPARISON:  07/14/2019 FINDINGS: Cardiovascular: Negative for pulmonary embolism. Heart size is prominent and left atrium appears to be enlarged measuring 5.0 cm in the AP dimension. No significant pericardial effusion. Small amount of calcium involving the LAD. Normal caliber of the thoracic aorta. Incidentally, the patient has a bovine type arch. Mediastinum/Nodes: Small hiatal hernia. No significant mediastinal or hilar lymph node enlargement. No axillary lymph node enlargement. Lungs/Pleura: Again noted is centrilobular emphysema. Chronic volume loss along the anterior aspect of the right minor fissure on image 73/6. Patchy dependent densities in both lungs particularly in the left lower lobe. Findings are suggestive for atelectasis. No pleural effusions. Upper  Abdomen: Question stranding near the pancreatic tail and splenic hilum but this area is incompletely imaged. Musculoskeletal: No acute bone abnormality. Again noted is elevation of the right hemidiaphragm. Review of the MIP images confirms the above findings. IMPRESSION: 1. Negative for pulmonary embolism. 2. Question stranding near the pancreatic tail and splenic hilum. This area is incompletely imaged. Recommend clinical correlation and consider further characterization with a dedicated CT of the abdomen and pelvis. 3. Patchy dependent densities in both lungs are most compatible with atelectasis. 4. Enlarged left atrium. Consider further characterization with echocardiogram. 5. Small hiatal hernia. 6. Emphysema (ICD10-J43.9). Electronically Signed   By: Richarda Overlie M.D.   On: 06/13/2022 07:31   DG Chest Portable 1 View  Result Date: 06/13/2022 CLINICAL DATA:  Chest pain. EXAM: PORTABLE CHEST 1 VIEW COMPARISON:  Portable chest 02/11/2019 FINDINGS: There is mild-to-moderate cardiomegaly. No vascular congestion is seen. No pleural collections. Mild chronic elevation right hemidiaphragm. The lungs are clear. Stable mediastinum with aortic tortuosity and atherosclerosis. There is overlying monitor wiring. No acute osseous findings. Osteopenia. Compare: Unchanged. IMPRESSION: No acute chest findings. Stable chest with cardiomegaly and aortic uncoiling and atherosclerosis. Electronically Signed   By: Almira Bar M.D.   On: 06/13/2022 04:35   MR BRAIN W WO CONTRAST  Result Date: 06/07/2022 CLINICAL DATA:  Provided history: Acoustic neuroma. EXAM: MRI HEAD WITHOUT AND WITH CONTRAST TECHNIQUE: Multiplanar, multiecho pulse sequences of the brain and surrounding structures were obtained without and with intravenous contrast. CONTRAST:  10 mL Vueway intravenous contrast. COMPARISON:  Brain MRI 03/16/2021.  Head CT 05/08/2017. FINDINGS: Brain: No age advanced or lobar predominant parenchymal atrophy. Multifocal T2  FLAIR hyperintense signal abnormality within the cerebral white matter and pons, nonspecific but compatible with moderate chronic small vessel ischemic disease. Redemonstrated focus of chronic encephalomalacia/gliosis within the anterior left temporal lobe. Unremarkable appearance of the seventh and eighth cranial nerves on today's examination. Specifically, the small foci of enhancement within the bilateral internal auditory canals described on the prior brain MRI of 03/16/2021 are not  appreciated on today's study. No evidence of acute infarct. No chronic intracranial blood products. No extra-axial fluid collection. No midline shift. No pathologic intracranial enhancement identified. Vascular: Maintained flow voids within the proximal large arterial vessels. Skull and upper cervical spine: No focal suspicious marrow lesion. Sinuses/Orbits: No mass or acute finding within the imaged orbits. Prior bilateral ocular lens replacement. Mild mucosal thickening within the right maxillary sinus. Other: Small-volume fluid within the bilateral mastoid air cells (right greater than left). IMPRESSION: 1. Unremarkable appearance of the bilateral seventh and eighth cranial nerves on today's examination. Specifically, the small foci of enhancement within bilateral internal auditory canals described on the prior brain MRI of 03/16/2021 are not appreciated on today's study. This may have reflected prominent vascular enhancement on the prior MRI. 2. Otherwise stable MRI appearance of the brain. 3. Moderate chronic small vessel ischemic changes within the cerebral white matter and pons. 4. Focus of chronic encephalomalacia/gliosis within the anterior left temporal lobe. 5. Mild mucosal thickening within the right maxillary sinus. 6. Small-volume fluid within the bilateral mastoid air cells (right greater than left). Electronically Signed   By: Jackey Loge D.O.   On: 06/07/2022 11:07    Assessment/Plan . 1. Acute pancreatitis,  unspecified complication status, unspecified pancreatitis type -  S/P ERCP on 06/15/22 -  general surgery following for possible laparoscopic cholecystectomy - CBC - Basic Metabolic Panel with eGFR  2. Physical deconditioning - Ambulatory referral to Home Health  3. Atrial tachycardia -  echo EF 50-55% -  was discharged with Zio patch -  continue Metoprolol BID - Ambulatory referral to Cardiology - CBC - Basic Metabolic Panel with eGFR  4. Chronic obstructive pulmonary disease, unspecified COPD type (HCC) -  no wheezing, stable -  continue PRN Albuterol and Trelegy Ellipta  5. 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 Hypothyroidism due to acquired atrophy of thyroid Lab Results  Component Value Date   TSH 2.68 01/19/2022   -  continue Synthroid    Labs/tests ordered:   - CBC - Basic Metabolic Panel with eGFR  Next appt:  07/31/2022

## 2022-06-22 NOTE — Transitions of Care (Post Inpatient/ED Visit) (Unsigned)
06/22/2022  Name: [name] MRN: [id number] DOB: 14-Feb-1940  Today's TOC FU Call Status: Today's TOC FU Call Status:: Successful TOC FU Call Competed  Transition Care Management Follow-up Telephone Call    Items Reviewed:    Medications Reviewed Today: Medications Reviewed Today     Reviewed by Ralph Dowdy, RN (Registered Nurse) on 06/15/22 at 1348  Med List Status: Complete   Medication Order Taking? Sig Documenting Provider Last Dose Status Informant  albuterol (VENTOLIN HFA) 108 (90 Base) MCG/ACT inhaler [id number] Yes Inhale 1 puff into the lungs as needed for wheezing or shortness of breath. [provider] unknown Active Self  Azelastine HCl (ASTEPRO) 0.15 % SOLN [id number] No Place 1 spray into the nose every morning.  Patient not taking: Reported on 06/13/2022   [name], MD Completed Course Active Self  Calcium Carb-Cholecalciferol (CALCIUM-VITAMIN D) 600-400 MG-UNIT TABS [id number] Yes Take 1 tablet by mouth daily. [provider] 06/12/2022 Active Self           Med Note (CAMPBELL, ROBIN   Thu May 13, 2017  8:33 AM)    citalopram (CELEXA) 20 MG tablet [id number] Yes TAKE 1 TABLET BY MOUTH EVERYDAY AT BEDTIME [name], NP 06/12/2022 Active Self  donepezil (ARICEPT) 10 MG tablet [id number] Yes TAKE 1 TABLET BY MOUTH EVERYDAY AT BEDTIME [name], NP 06/12/2022 Active Self  ELIQUIS 5 MG TABS tablet [id number] Yes TAKE 1 TABLET BY MOUTH TWICE A Brock Bad, MD 06/12/2022 2300 Active Self  ferrous sulfate 325 (65 FE) MG EC tablet [id number] Yes Take 1 tablet (325 mg total) by mouth daily with breakfast. [name], NP 06/12/2022 Active Self  fluticasone (FLONASE) 50 MCG/ACT nasal spray [id number] Yes INSTILL 1 SPRAY INTO BOTH NOSTRILS DAILY [name], MD 06/12/2022 Active Self  Fluticasone-Umeclidin-Vilant (TRELEGY ELLIPTA) 100-62.5-25 MCG/ACT AEPB [id number] Yes TAKE 1 PUFF BY MOUTH EVERY DAY [name], MD 06/12/2022 Active  Self  guaiFENesin (MUCINEX) 600 MG 12 hr tablet [id number] Yes Take 1,200 mg by mouth daily. [provider] 06/12/2022 Active Self  ipratropium (ATROVENT) 0.03 % nasal spray [id number] Yes Place 2 sprays into both nostrils 2 (two) times daily. [provider] 06/12/2022 Active Self  levothyroxine (SYNTHROID) 88 MCG tablet [id number] Yes TAKE 1 TABLET BY MOUTH EVERY DAY IN THE MORNING [name], NP 06/12/2022 Active Self  losartan (COZAAR) 100 MG tablet [id number] Yes TAKE 1 TABLET BY MOUTH EVERY DAY [name], NP 06/12/2022 Active Self  metFORMIN (GLUCOPHAGE) 500 MG tablet [id number] Yes Take 1 tablet (500 mg total) by mouth daily with supper. [name], NP 06/12/2022 Active Self  montelukast (SINGULAIR) 10 MG tablet [id number] Yes TAKE 1 TABLET BY MOUTH EVERYDAY AT BEDTIME [name], MD 06/12/2022 Active Self  Multiple Vitamin (MULTIVITAMIN WITH MINERALS) TABS tablet [id number] Yes Take 1 tablet by mouth daily. [provider] 06/12/2022 Active Self  pantoprazole (PROTONIX) 40 MG tablet [id number] Yes Take 1 tablet (40 mg total) by mouth daily. [name], NP 06/12/2022 Active Self  rOPINIRole (REQUIP) 1 MG tablet [id number] Yes TAKE 1 TABLET BY MOUTH EVERYDAY AT BEDTIME [name], NP 06/12/2022 Active Self  rosuvastatin (CRESTOR) 10 MG tablet [id number] Yes Take 2 tablets (20 mg total) by mouth daily. E78.2 [name], NP 06/12/2022 Active Self  tacrolimus (PROGRAF) 1 MG capsule [id number] Yes Take 1 mg by mouth 2 (two) times daily.  [provider] 06/12/2022 Active Self  Home Care and Equipment/Supplies:    Functional Questionnaire:    Follow up appointments reviewed:      SIGNATUREMLP

## 2022-06-23 LAB — CBC
HCT: 42.8 % (ref 35.0–45.0)
MCH: 27 pg (ref 27.0–33.0)
MCHC: 32 g/dL (ref 32.0–36.0)
MCV: 84.4 fL (ref 80.0–100.0)
MPV: 10.7 fL (ref 7.5–12.5)
RBC: 5.07 10*6/uL (ref 3.80–5.10)

## 2022-06-23 LAB — BASIC METABOLIC PANEL WITH GFR
BUN/Creatinine Ratio: 21 (calc) (ref 6–22)
BUN: 20 mg/dL (ref 7–25)
CO2: 27 mmol/L (ref 20–32)
Calcium: 9.3 mg/dL (ref 8.6–10.4)
Chloride: 103 mmol/L (ref 98–110)
Creat: 0.96 mg/dL — ABNORMAL HIGH (ref 0.60–0.95)
Glucose, Bld: 132 mg/dL — ABNORMAL HIGH (ref 65–99)
Potassium: 4.2 mmol/L (ref 3.5–5.3)
Sodium: 143 mmol/L (ref 135–146)
eGFR: 59 mL/min/{1.73_m2} — ABNORMAL LOW (ref >=60)

## 2022-06-24 DIAGNOSIS — I4719 Other supraventricular tachycardia: Secondary | ICD-10-CM | POA: Diagnosis not present

## 2022-06-26 DIAGNOSIS — E119 Type 2 diabetes mellitus without complications: Secondary | ICD-10-CM | POA: Diagnosis not present

## 2022-06-26 DIAGNOSIS — I110 Hypertensive heart disease with heart failure: Secondary | ICD-10-CM | POA: Diagnosis not present

## 2022-06-26 DIAGNOSIS — J4489 Other specified chronic obstructive pulmonary disease: Secondary | ICD-10-CM | POA: Diagnosis not present

## 2022-06-26 DIAGNOSIS — I509 Heart failure, unspecified: Secondary | ICD-10-CM | POA: Diagnosis not present

## 2022-06-26 DIAGNOSIS — I4719 Other supraventricular tachycardia: Secondary | ICD-10-CM | POA: Diagnosis not present

## 2022-06-26 DIAGNOSIS — Z48815 Encounter for surgical aftercare following surgery on the digestive system: Secondary | ICD-10-CM | POA: Diagnosis not present

## 2022-06-26 DIAGNOSIS — K449 Diaphragmatic hernia without obstruction or gangrene: Secondary | ICD-10-CM | POA: Diagnosis not present

## 2022-06-26 DIAGNOSIS — I350 Nonrheumatic aortic (valve) stenosis: Secondary | ICD-10-CM | POA: Diagnosis not present

## 2022-06-26 DIAGNOSIS — I700 Atherosclerosis of aorta: Secondary | ICD-10-CM | POA: Diagnosis not present

## 2022-06-29 DIAGNOSIS — I509 Heart failure, unspecified: Secondary | ICD-10-CM | POA: Diagnosis not present

## 2022-06-29 DIAGNOSIS — J4489 Other specified chronic obstructive pulmonary disease: Secondary | ICD-10-CM | POA: Diagnosis not present

## 2022-06-29 DIAGNOSIS — I4719 Other supraventricular tachycardia: Secondary | ICD-10-CM | POA: Diagnosis not present

## 2022-06-29 DIAGNOSIS — I110 Hypertensive heart disease with heart failure: Secondary | ICD-10-CM | POA: Diagnosis not present

## 2022-06-29 DIAGNOSIS — E119 Type 2 diabetes mellitus without complications: Secondary | ICD-10-CM | POA: Diagnosis not present

## 2022-06-29 DIAGNOSIS — Z48815 Encounter for surgical aftercare following surgery on the digestive system: Secondary | ICD-10-CM | POA: Diagnosis not present

## 2022-06-29 DIAGNOSIS — K449 Diaphragmatic hernia without obstruction or gangrene: Secondary | ICD-10-CM | POA: Diagnosis not present

## 2022-06-29 DIAGNOSIS — I350 Nonrheumatic aortic (valve) stenosis: Secondary | ICD-10-CM | POA: Diagnosis not present

## 2022-06-29 DIAGNOSIS — I700 Atherosclerosis of aorta: Secondary | ICD-10-CM | POA: Diagnosis not present

## 2022-06-29 NOTE — Progress Notes (Signed)
-    kidney function is same as previous, stable -  electrolytes within normal -  CBC normal, no anemia

## 2022-06-30 ENCOUNTER — Other Ambulatory Visit: Payer: Self-pay

## 2022-06-30 DIAGNOSIS — R5381 Other malaise: Secondary | ICD-10-CM

## 2022-06-30 NOTE — Progress Notes (Signed)
Order signed

## 2022-06-30 NOTE — Progress Notes (Deleted)
Patient called requesting new order for walker. The previous order was placed in 2022 and has expired. Patient never got walker. Order has been pended and sent to Abbey Chatters, NP to review and sign. Patient is wanting to pick up order when she comes in for her prolia injection on 5/23.  Message sent to Abbey Chatters, NP

## 2022-06-30 NOTE — Progress Notes (Unsigned)
Patient called requesting new order for walker. The previous order was placed in 2022 and has expired. Patient never got walker. Order has been pended and sent to Abbey Chatters, NP to review and sign. Patient is wanting to pick up order when she comes in for her prolia injection on 5/23.  Diagnosis taken from original order   Message sent to Abbey Chatters, NP

## 2022-07-01 NOTE — Progress Notes (Signed)
Patient is aware that order has been placed. Order printed and placed upfront for patient to pick up.

## 2022-07-02 ENCOUNTER — Ambulatory Visit: Payer: Medicare PPO

## 2022-07-02 DIAGNOSIS — J4489 Other specified chronic obstructive pulmonary disease: Secondary | ICD-10-CM | POA: Diagnosis not present

## 2022-07-02 DIAGNOSIS — M810 Age-related osteoporosis without current pathological fracture: Secondary | ICD-10-CM

## 2022-07-02 DIAGNOSIS — I700 Atherosclerosis of aorta: Secondary | ICD-10-CM | POA: Diagnosis not present

## 2022-07-02 DIAGNOSIS — E119 Type 2 diabetes mellitus without complications: Secondary | ICD-10-CM | POA: Diagnosis not present

## 2022-07-02 DIAGNOSIS — I350 Nonrheumatic aortic (valve) stenosis: Secondary | ICD-10-CM | POA: Diagnosis not present

## 2022-07-02 DIAGNOSIS — I110 Hypertensive heart disease with heart failure: Secondary | ICD-10-CM | POA: Diagnosis not present

## 2022-07-02 DIAGNOSIS — I509 Heart failure, unspecified: Secondary | ICD-10-CM | POA: Diagnosis not present

## 2022-07-02 DIAGNOSIS — Z48815 Encounter for surgical aftercare following surgery on the digestive system: Secondary | ICD-10-CM | POA: Diagnosis not present

## 2022-07-02 DIAGNOSIS — I4719 Other supraventricular tachycardia: Secondary | ICD-10-CM | POA: Diagnosis not present

## 2022-07-02 DIAGNOSIS — K449 Diaphragmatic hernia without obstruction or gangrene: Secondary | ICD-10-CM | POA: Diagnosis not present

## 2022-07-02 MED ORDER — DENOSUMAB 60 MG/ML ~~LOC~~ SOSY
60.0000 mg | PREFILLED_SYRINGE | Freq: Once | SUBCUTANEOUS | Status: AC
Start: 2022-07-02 — End: 2022-07-02
  Administered 2022-07-02: 60 mg via SUBCUTANEOUS

## 2022-07-03 DIAGNOSIS — E119 Type 2 diabetes mellitus without complications: Secondary | ICD-10-CM | POA: Diagnosis not present

## 2022-07-03 DIAGNOSIS — I350 Nonrheumatic aortic (valve) stenosis: Secondary | ICD-10-CM | POA: Diagnosis not present

## 2022-07-03 DIAGNOSIS — K449 Diaphragmatic hernia without obstruction or gangrene: Secondary | ICD-10-CM | POA: Diagnosis not present

## 2022-07-03 DIAGNOSIS — Z48815 Encounter for surgical aftercare following surgery on the digestive system: Secondary | ICD-10-CM | POA: Diagnosis not present

## 2022-07-03 DIAGNOSIS — I509 Heart failure, unspecified: Secondary | ICD-10-CM | POA: Diagnosis not present

## 2022-07-03 DIAGNOSIS — I700 Atherosclerosis of aorta: Secondary | ICD-10-CM | POA: Diagnosis not present

## 2022-07-03 DIAGNOSIS — I4719 Other supraventricular tachycardia: Secondary | ICD-10-CM | POA: Diagnosis not present

## 2022-07-03 DIAGNOSIS — J4489 Other specified chronic obstructive pulmonary disease: Secondary | ICD-10-CM | POA: Diagnosis not present

## 2022-07-03 DIAGNOSIS — I110 Hypertensive heart disease with heart failure: Secondary | ICD-10-CM | POA: Diagnosis not present

## 2022-07-07 DIAGNOSIS — Z48815 Encounter for surgical aftercare following surgery on the digestive system: Secondary | ICD-10-CM | POA: Diagnosis not present

## 2022-07-07 DIAGNOSIS — I700 Atherosclerosis of aorta: Secondary | ICD-10-CM | POA: Diagnosis not present

## 2022-07-07 DIAGNOSIS — I509 Heart failure, unspecified: Secondary | ICD-10-CM | POA: Diagnosis not present

## 2022-07-07 DIAGNOSIS — I350 Nonrheumatic aortic (valve) stenosis: Secondary | ICD-10-CM | POA: Diagnosis not present

## 2022-07-07 DIAGNOSIS — K449 Diaphragmatic hernia without obstruction or gangrene: Secondary | ICD-10-CM | POA: Diagnosis not present

## 2022-07-07 DIAGNOSIS — I110 Hypertensive heart disease with heart failure: Secondary | ICD-10-CM | POA: Diagnosis not present

## 2022-07-07 DIAGNOSIS — E119 Type 2 diabetes mellitus without complications: Secondary | ICD-10-CM | POA: Diagnosis not present

## 2022-07-07 DIAGNOSIS — J4489 Other specified chronic obstructive pulmonary disease: Secondary | ICD-10-CM | POA: Diagnosis not present

## 2022-07-07 DIAGNOSIS — I4719 Other supraventricular tachycardia: Secondary | ICD-10-CM | POA: Diagnosis not present

## 2022-07-08 DIAGNOSIS — D239 Other benign neoplasm of skin, unspecified: Secondary | ICD-10-CM | POA: Diagnosis not present

## 2022-07-08 DIAGNOSIS — D485 Neoplasm of uncertain behavior of skin: Secondary | ICD-10-CM | POA: Diagnosis not present

## 2022-07-08 DIAGNOSIS — D225 Melanocytic nevi of trunk: Secondary | ICD-10-CM | POA: Diagnosis not present

## 2022-07-08 DIAGNOSIS — D0472 Carcinoma in situ of skin of left lower limb, including hip: Secondary | ICD-10-CM | POA: Diagnosis not present

## 2022-07-08 DIAGNOSIS — L578 Other skin changes due to chronic exposure to nonionizing radiation: Secondary | ICD-10-CM | POA: Diagnosis not present

## 2022-07-08 DIAGNOSIS — L570 Actinic keratosis: Secondary | ICD-10-CM | POA: Diagnosis not present

## 2022-07-08 DIAGNOSIS — C44529 Squamous cell carcinoma of skin of other part of trunk: Secondary | ICD-10-CM | POA: Diagnosis not present

## 2022-07-08 DIAGNOSIS — D045 Carcinoma in situ of skin of trunk: Secondary | ICD-10-CM | POA: Diagnosis not present

## 2022-07-08 DIAGNOSIS — D2262 Melanocytic nevi of left upper limb, including shoulder: Secondary | ICD-10-CM | POA: Diagnosis not present

## 2022-07-08 DIAGNOSIS — L814 Other melanin hyperpigmentation: Secondary | ICD-10-CM | POA: Diagnosis not present

## 2022-07-08 DIAGNOSIS — Z85828 Personal history of other malignant neoplasm of skin: Secondary | ICD-10-CM | POA: Diagnosis not present

## 2022-07-08 DIAGNOSIS — L821 Other seborrheic keratosis: Secondary | ICD-10-CM | POA: Diagnosis not present

## 2022-07-12 ENCOUNTER — Other Ambulatory Visit: Payer: Self-pay | Admitting: Nurse Practitioner

## 2022-07-12 DIAGNOSIS — K219 Gastro-esophageal reflux disease without esophagitis: Secondary | ICD-10-CM

## 2022-07-15 DIAGNOSIS — K449 Diaphragmatic hernia without obstruction or gangrene: Secondary | ICD-10-CM | POA: Diagnosis not present

## 2022-07-15 DIAGNOSIS — I700 Atherosclerosis of aorta: Secondary | ICD-10-CM | POA: Diagnosis not present

## 2022-07-15 DIAGNOSIS — J4489 Other specified chronic obstructive pulmonary disease: Secondary | ICD-10-CM | POA: Diagnosis not present

## 2022-07-15 DIAGNOSIS — I110 Hypertensive heart disease with heart failure: Secondary | ICD-10-CM | POA: Diagnosis not present

## 2022-07-15 DIAGNOSIS — I509 Heart failure, unspecified: Secondary | ICD-10-CM | POA: Diagnosis not present

## 2022-07-15 DIAGNOSIS — I350 Nonrheumatic aortic (valve) stenosis: Secondary | ICD-10-CM | POA: Diagnosis not present

## 2022-07-15 DIAGNOSIS — I4719 Other supraventricular tachycardia: Secondary | ICD-10-CM | POA: Diagnosis not present

## 2022-07-15 DIAGNOSIS — E119 Type 2 diabetes mellitus without complications: Secondary | ICD-10-CM | POA: Diagnosis not present

## 2022-07-15 DIAGNOSIS — Z48815 Encounter for surgical aftercare following surgery on the digestive system: Secondary | ICD-10-CM | POA: Diagnosis not present

## 2022-07-17 DIAGNOSIS — I4719 Other supraventricular tachycardia: Secondary | ICD-10-CM | POA: Diagnosis not present

## 2022-07-22 DIAGNOSIS — J4489 Other specified chronic obstructive pulmonary disease: Secondary | ICD-10-CM | POA: Diagnosis not present

## 2022-07-22 DIAGNOSIS — I509 Heart failure, unspecified: Secondary | ICD-10-CM | POA: Diagnosis not present

## 2022-07-22 DIAGNOSIS — E119 Type 2 diabetes mellitus without complications: Secondary | ICD-10-CM | POA: Diagnosis not present

## 2022-07-22 DIAGNOSIS — I350 Nonrheumatic aortic (valve) stenosis: Secondary | ICD-10-CM | POA: Diagnosis not present

## 2022-07-22 DIAGNOSIS — I4719 Other supraventricular tachycardia: Secondary | ICD-10-CM | POA: Diagnosis not present

## 2022-07-22 DIAGNOSIS — K449 Diaphragmatic hernia without obstruction or gangrene: Secondary | ICD-10-CM | POA: Diagnosis not present

## 2022-07-22 DIAGNOSIS — I110 Hypertensive heart disease with heart failure: Secondary | ICD-10-CM | POA: Diagnosis not present

## 2022-07-22 DIAGNOSIS — I700 Atherosclerosis of aorta: Secondary | ICD-10-CM | POA: Diagnosis not present

## 2022-07-22 DIAGNOSIS — Z48815 Encounter for surgical aftercare following surgery on the digestive system: Secondary | ICD-10-CM | POA: Diagnosis not present

## 2022-07-23 DIAGNOSIS — K859 Acute pancreatitis without necrosis or infection, unspecified: Secondary | ICD-10-CM | POA: Diagnosis not present

## 2022-07-23 DIAGNOSIS — K219 Gastro-esophageal reflux disease without esophagitis: Secondary | ICD-10-CM | POA: Diagnosis not present

## 2022-07-23 DIAGNOSIS — G4733 Obstructive sleep apnea (adult) (pediatric): Secondary | ICD-10-CM | POA: Diagnosis not present

## 2022-07-23 DIAGNOSIS — I2699 Other pulmonary embolism without acute cor pulmonale: Secondary | ICD-10-CM | POA: Diagnosis not present

## 2022-07-24 ENCOUNTER — Other Ambulatory Visit: Payer: Self-pay | Admitting: Nurse Practitioner

## 2022-07-24 ENCOUNTER — Telehealth: Payer: Self-pay

## 2022-07-24 NOTE — Telephone Encounter (Signed)
Patient called stating that she no longer needs her oxygen. She states that she needs a doctor's note or phone call to [employer] saying that she no longer needs the oxygen in order for it to be picked up.   [address], [place] [address], [place] [phone] Fax [phone] Toll Free [phone]   Message sent to Abbey Chatters, NP

## 2022-07-28 ENCOUNTER — Other Ambulatory Visit: Payer: Self-pay | Admitting: Nurse Practitioner

## 2022-07-28 DIAGNOSIS — D508 Other iron deficiency anemias: Secondary | ICD-10-CM

## 2022-07-28 NOTE — Telephone Encounter (Signed)
Will need to discuss at next OV

## 2022-07-29 ENCOUNTER — Other Ambulatory Visit: Payer: Self-pay

## 2022-07-29 MED ORDER — METOPROLOL TARTRATE 25 MG PO TABS: 37.5000 mg | ORAL_TABLET | Freq: Two times a day (BID) | ORAL | 3 refills | Status: DC

## 2022-07-29 NOTE — Telephone Encounter (Signed)
Informed patient that this will be discussed at her upcoming visit.

## 2022-07-31 ENCOUNTER — Ambulatory Visit: Payer: Medicare PPO | Admitting: Nurse Practitioner

## 2022-07-31 ENCOUNTER — Encounter: Payer: Self-pay | Admitting: Nurse Practitioner

## 2022-07-31 VITALS — BP 124/80 | HR 79 | Temp 97.1°F | Ht 68.0 in | Wt 215.0 lb

## 2022-07-31 DIAGNOSIS — I10 Essential (primary) hypertension: Secondary | ICD-10-CM | POA: Diagnosis not present

## 2022-07-31 DIAGNOSIS — N1832 Chronic kidney disease, stage 3b: Secondary | ICD-10-CM

## 2022-07-31 DIAGNOSIS — D508 Other iron deficiency anemias: Secondary | ICD-10-CM

## 2022-07-31 DIAGNOSIS — I110 Hypertensive heart disease with heart failure: Secondary | ICD-10-CM

## 2022-07-31 DIAGNOSIS — D6869 Other thrombophilia: Secondary | ICD-10-CM | POA: Diagnosis not present

## 2022-07-31 DIAGNOSIS — G4733 Obstructive sleep apnea (adult) (pediatric): Secondary | ICD-10-CM | POA: Diagnosis not present

## 2022-07-31 DIAGNOSIS — E034 Atrophy of thyroid (acquired): Secondary | ICD-10-CM

## 2022-07-31 DIAGNOSIS — M810 Age-related osteoporosis without current pathological fracture: Secondary | ICD-10-CM

## 2022-07-31 DIAGNOSIS — F419 Anxiety disorder, unspecified: Secondary | ICD-10-CM | POA: Diagnosis not present

## 2022-07-31 DIAGNOSIS — E1122 Type 2 diabetes mellitus with diabetic chronic kidney disease: Secondary | ICD-10-CM | POA: Diagnosis not present

## 2022-07-31 DIAGNOSIS — M6281 Muscle weakness (generalized): Secondary | ICD-10-CM | POA: Diagnosis not present

## 2022-07-31 DIAGNOSIS — J449 Chronic obstructive pulmonary disease, unspecified: Secondary | ICD-10-CM | POA: Diagnosis not present

## 2022-07-31 DIAGNOSIS — S32020D Wedge compression fracture of second lumbar vertebra, subsequent encounter for fracture with routine healing: Secondary | ICD-10-CM | POA: Diagnosis not present

## 2022-07-31 DIAGNOSIS — M179 Osteoarthritis of knee, unspecified: Secondary | ICD-10-CM | POA: Diagnosis not present

## 2022-07-31 DIAGNOSIS — G629 Polyneuropathy, unspecified: Secondary | ICD-10-CM | POA: Diagnosis not present

## 2022-07-31 DIAGNOSIS — S32010D Wedge compression fracture of first lumbar vertebra, subsequent encounter for fracture with routine healing: Secondary | ICD-10-CM | POA: Diagnosis not present

## 2022-07-31 NOTE — Progress Notes (Signed)
Careteam: Patient Care Team: [name], NP as PCP - General (Geriatric Medicine) [name], MD as Consulting Physician (Orthopedic Surgery) [name], MD as Consulting Physician (Dermatology) [name], MD as Referring Physician (Nephrology) [name], MD as Consulting Physician (Pulmonary Disease) Shea Evans Genice Rouge (Optometry)  PLACE OF SERVICE:  [facility]  Advanced Directive information Does Patient Have a Medical Advance Directive?: Yes, Type of Advance Directive: Healthcare Power of Price;Living will, Does patient want to make changes to medical advance directive?: No - Patient declined  No Known Allergies  Chief Complaint  Patient presents with   Medical Management of Chronic Issues    6 month follow-up and foot exam. Discuss need for A1c, td/tdap, and eye exam (Dr.Dunn, patient is due and plans to call for an appointment). Patient needs a copy of today';s note in order to get her walker from [employer].      HPI: Patient is a 82 y.o. female for routine follow up.   Reports she was discharged home from the hospital they sent her home with oxgyen, she was not even wearing the oxygen all the time when she was discharged from the hospital. Her O2 has been fine at home- checking with a pulse ox ranging from 92-95%. She is able to walk around without shortness of breath.  Completed home health PT. Continues to exercises.  Not having any trouble eating at this time. Able to eat fried food and not having any diarrhea or abdominal pain.   S/p lap chole- doing well at this time. Would like to have a rolling walker in case she needs to sit down and rest. She has hx of COPD, DM with neuropathy.   Reports she does have some numbness in her feet. No pain.  Reports they are purple all the time.   Continues on iron supplement- has more energy now than what she did    Review of Systems:  Review of Systems  Constitutional:  Negative for chills, fever and  weight loss.  HENT:  Negative for tinnitus.   Respiratory:  Negative for cough, sputum production and shortness of breath.   Cardiovascular:  Negative for chest pain, palpitations and leg swelling.  Gastrointestinal:  Negative for abdominal pain, constipation, diarrhea and heartburn.  Genitourinary:  Negative for dysuria, frequency and urgency.  Musculoskeletal:  Negative for back pain, falls, joint pain and myalgias.  Skin: Negative.   Neurological:  Negative for dizziness and headaches.  Psychiatric/Behavioral:  Negative for depression and memory loss. The patient does not have insomnia.     Past Medical History:  Diagnosis Date   Actinic keratosis    Arthritis    Asthma    Back injury 2019   Fracture   Basal cell carcinoma    COPD (chronic obstructive pulmonary disease) (HCC)    GERD (gastroesophageal reflux disease)    H/O blood clots 2020   Lung, Per [facility] New Patient Packet   H/O mammogram 2022   Per [facility] New Patient Packet   Heart murmur    hx of in childhood    High cholesterol    Hyperlipidemia    Hypertension    Hypothyroidism    Kidney disease    Per [facility] New Patient Packet   MCNS (minimal change nephrotic syndrome)    OSA (obstructive sleep apnea) 08/29/2019   Osteopenia    Shortness of breath dyspnea    on exertion    Squamous cell carcinoma    Thyroid disease  Per [facility] New Patient Packet   Past Surgical History:  Procedure Laterality Date   ABDOMINAL HYSTERECTOMY     APPENDECTOMY     BILATERAL SALPINGOOPHORECTOMY     Ovarian Cysts    CHOLECYSTECTOMY N/A 06/17/2022   Procedure: LAPAROSCOPIC CHOLECYSTECTOMY;  Surgeon: Fritzi Mandes, MD;  Location: [facility];  Service: General;  Laterality: N/A;   COLONOSCOPY  2001   Dr.Mann, Per [facility] New Patient Packet   CONVERSION TO TOTAL KNEE Right 07/10/2014   Procedure: RIGHT CONVERSION OF PARTIAL KNEE TO TOTAL KNEE;  Surgeon: [name], MD;  Location: [facility];  Service: Orthopedics;  Laterality: Right;   ENDOSCOPIC  RETROGRADE CHOLANGIOPANCREATOGRAPHY (ERCP) WITH PROPOFOL N/A 06/15/2022   Procedure: ENDOSCOPIC RETROGRADE CHOLANGIOPANCREATOGRAPHY (ERCP) WITH PROPOFOL;  Surgeon: Vida Rigger, MD;  Location: [facility];  Service: Gastroenterology;  Laterality: N/A;   IR ANGIOGRAM PULMONARY BILATERAL SELECTIVE  02/08/2019   IR ANGIOGRAM SELECTIVE EACH ADDITIONAL VESSEL  02/08/2019   IR ANGIOGRAM SELECTIVE EACH ADDITIONAL VESSEL  02/08/2019   IR INFUSION THROMBOL ARTERIAL INITIAL (MS)  02/08/2019   IR INFUSION THROMBOL ARTERIAL INITIAL (MS)  02/08/2019   IR THROMB F/U EVAL ART/VEN FINAL DAY (MS)  02/09/2019   IR US GUIDE VASC ACCESS RIGHT  02/08/2019   MEDIAL PARTIAL KNEE REPLACEMENT Bilateral    MOHS SURGERY     x 2   REMOVAL OF STONES  06/15/2022   Procedure: REMOVAL OF STONES;  Surgeon: Vida Rigger, MD;  Location: [facility];  Service: Gastroenterology;;   RENAL BIOPSY     x 2   REPLACEMENT TOTAL KNEE  2012   Per [facility] New Patient Packet   ROTATOR CUFF REPAIR Left 2008   SPHINCTEROTOMY  06/15/2022   Procedure: SPHINCTEROTOMY;  Surgeon: Vida Rigger, MD;  Location: [facility];  Service: Gastroenterology;;   Social History:   reports that she quit smoking about 49 years ago. Her smoking use included cigarettes. She has a 37.50 pack-year smoking history. She has never used smokeless tobacco. She reports current alcohol use of about 0.5 standard drinks of alcohol per week. She reports that she does not use drugs.  Family History  Problem Relation Age of Onset   COPD Mother        Husband Smoked    Lymphoma Father    Hypertension Sister    Scoliosis Daughter    Stroke Maternal Grandfather    Rheumatic fever Paternal Grandmother    Heart disease Paternal Grandfather     Medications: Patient's Medications  New Prescriptions   No medications on file  Previous Medications   ACETAMINOPHEN (TYLENOL) 500 MG TABLET    Take 2 tablets (1,000 mg total) by mouth every 6 (six) hours as needed.   ALBUTEROL  (VENTOLIN HFA) 108 (90 BASE) MCG/ACT INHALER    Inhale 1 puff into the lungs as needed for wheezing or shortness of breath.   CALCIUM CARB-CHOLECALCIFEROL (CALCIUM-VITAMIN D) 600-400 MG-UNIT TABS    Take 1 tablet by mouth daily.   CITALOPRAM (CELEXA) 20 MG TABLET    TAKE 1 TABLET BY MOUTH EVERYDAY AT BEDTIME   DONEPEZIL (ARICEPT) 10 MG TABLET    TAKE 1 TABLET BY MOUTH EVERYDAY AT BEDTIME   ELIQUIS 5 MG TABS TABLET    TAKE 1 TABLET BY MOUTH TWICE A DAY   FERROUS SULFATE 325 (65 FE) MG EC TABLET    TAKE 1 TABLET BY MOUTH EVERY DAY WITH BREAKFAST   FLUTICASONE (FLONASE) 50 MCG/ACT NASAL SPRAY    INSTILL 1  SPRAY INTO BOTH NOSTRILS DAILY   FLUTICASONE-UMECLIDIN-VILANT (TRELEGY ELLIPTA) 100-62.5-25 MCG/ACT AEPB    TAKE 1 PUFF BY MOUTH EVERY DAY   GUAIFENESIN (MUCINEX) 600 MG 12 HR TABLET    Take 1,200 mg by mouth daily.   IPRATROPIUM (ATROVENT) 0.03 % NASAL SPRAY    Place 2 sprays into both nostrils 2 (two) times daily.   LEVOTHYROXINE (SYNTHROID) 88 MCG TABLET    TAKE 1 TABLET BY MOUTH EVERY DAY IN THE MORNING   LOSARTAN (COZAAR) 100 MG TABLET    TAKE 1 TABLET BY MOUTH EVERY DAY   METFORMIN (GLUCOPHAGE) 500 MG TABLET    Take 1 tablet (500 mg total) by mouth daily with supper.   METOPROLOL TARTRATE (LOPRESSOR) 25 MG TABLET    Take 1.5 tablets (37.5 mg total) by mouth 2 (two) times daily.   MONTELUKAST (SINGULAIR) 10 MG TABLET    TAKE 1 TABLET BY MOUTH EVERYDAY AT BEDTIME   MULTIPLE VITAMIN (MULTIVITAMIN WITH MINERALS) TABS TABLET    Take 1 tablet by mouth daily.   PANTOPRAZOLE (PROTONIX) 40 MG TABLET    TAKE 1 TABLET BY MOUTH EVERY DAY   ROPINIROLE (REQUIP) 1 MG TABLET    TAKE 1 TABLET BY MOUTH EVERYDAY AT BEDTIME   ROSUVASTATIN (CRESTOR) 10 MG TABLET    Take 2 tablets (20 mg total) by mouth daily. E78.2   TACROLIMUS (PROGRAF) 1 MG CAPSULE    Take 1 mg by mouth 2 (two) times daily.   Modified Medications   No medications on file  Discontinued Medications   No medications on file    Physical  Exam:  Vitals:   07/31/22 1440  BP: 124/80  Pulse: 79  Temp: (!) 97.1 F (36.2 C)  TempSrc: Temporal  SpO2: 98%  Weight: 215 lb (97.5 kg)  Height: 5\' 8"  (1.727 m)   Body mass index is 32.69 kg/m. Wt Readings from Last 3 Encounters:  07/31/22 215 lb (97.5 kg)  06/22/22 217 lb 4 oz (98.5 kg)  06/21/22 218 lb 8 oz (99.1 kg)    Physical Exam Constitutional:      General: She is not in acute distress.    Appearance: She is well-developed. She is not diaphoretic.  HENT:     Head: Normocephalic and atraumatic.     Mouth/Throat:     Pharynx: No oropharyngeal exudate.  Eyes:     Conjunctiva/sclera: Conjunctivae normal.     Pupils: Pupils are equal, round, and reactive to light.  Cardiovascular:     Rate and Rhythm: Normal rate and regular rhythm.     Heart sounds: Normal heart sounds.  Pulmonary:     Effort: Pulmonary effort is normal.     Breath sounds: Normal breath sounds.  Abdominal:     General: Bowel sounds are normal.     Palpations: Abdomen is soft.  Musculoskeletal:     Cervical back: Normal range of motion and neck supple.     Right lower leg: No edema.     Left lower leg: No edema.  Skin:    General: Skin is warm and dry.  Neurological:     Mental Status: She is alert.  Psychiatric:        Mood and Affect: Mood normal.     Labs reviewed: Basic Metabolic Panel: Recent Labs    01/19/22 0851 06/13/22 0409 06/15/22 0400 06/16/22 0401 06/17/22 0358 06/18/22 0416 06/19/22 0415 06/20/22 0352 06/21/22 0352 06/22/22 1521  NA 141   < > 137 134* 136   < >  140 141 141 143  K 4.4   < > 4.3 4.8 3.3*   < > 3.9 3.6 4.1 4.2  CL 105   < > 102 99 98   < > 101 98 100 103  CO2 30   < > 28 28 29    < > 31 34* 31 27  GLUCOSE 137*   < > 102* 149* 135*   < > 133* 131* 142* 132*  BUN 17   < > 14 16 14    < > 20 19 19 20   CREATININE 0.98*   < > 0.92 0.91 0.91   < > 1.01* 0.95 1.06* 0.96*  CALCIUM 8.3*   < > 8.2* 8.3* 8.0*   < > 8.0* 8.1* 8.4* 9.3  MG  --    < > 1.8  1.9 1.5*   < > 1.8 1.5* 1.8  --   PHOS  --    < > 3.8 4.6 3.2  --   --   --   --   --   TSH 2.68  --   --   --   --   --   --   --   --   --    < > = values in this interval not displayed.   Liver Function Tests: Recent Labs    06/19/22 0415 06/20/22 0352 06/21/22 0352  AST 26 21 18   ALT 32 31 25  ALKPHOS 35* 41 46  BILITOT 0.5 0.6 0.7  PROT 6.2* 6.7 7.1  ALBUMIN 2.8* 2.9* 3.1*   Recent Labs    06/13/22 0800 06/15/22 1015  LIPASE 3,094* 47   No results for input(s): "AMMONIA" in the last 8760 hours. CBC: Recent Labs    06/15/22 0400 06/16/22 0401 06/17/22 0358 06/18/22 0416 06/20/22 0352 06/21/22 0352 06/22/22 1521  WBC 11.1* 6.8 11.0*   < > 9.8 10.1 10.7  NEUTROABS 7.7 5.6 8.0*  --   --   --   --   HGB 12.3 11.1* 10.9*   < > 11.4* 12.1 13.7  HCT 40.6 37.2 36.2   < > 38.6 40.2 42.8  MCV 91.4 90.7 90.0   < > 90.6 89.7 84.4  PLT 242 221 241   < > 273 323 382   < > = values in this interval not displayed.   Lipid Panel: Recent Labs    01/19/22 0851  CHOL 125  HDL 63  LDLCALC 39  TRIG 145  CHOLHDL 2.0   TSH: Recent Labs    01/19/22 0851  TSH 2.68   A1C: Lab Results  Component Value Date   HGBA1C 6.6 (H) 01/19/2022     Assessment/Plan 1. Age-related osteoporosis without current pathological fracture -continues on prolia with cal and vit D -weight bearing exercises encouraged  2. Chronic obstructive pulmonary disease, unspecified COPD type (HCC) Has been stable and oxygen level well maintain off oxygen, okay to discontinued.  DME 4 wheeled rolling walker with seat ([id number])  3. Hypothyroidism due to acquired atrophy of thyroid TSH at goal on synthroid 88 mcg  4. Type 2 diabetes mellitus with stage 3b chronic kidney disease, without long-term current use of insulin (HCC) -Encouraged dietary compliance, routine foot care/monitoring and to keep up with diabetic eye exams through ophthalmology  Continue son metformin  - Hemoglobin A1c -  Complete Metabolic Panel with eGFR - For home use only DME 4 wheeled rolling walker with seat ([id number]) - Urine microalbumin-creatinine with uACR  5. Neuropathy Reports  some numbness in feet,will monitor DME 4 wheeled rolling walker with seat ([id number])  6. Iron deficiency anemia secondary to inadequate dietary iron intake Improved symptoms since on iron, no signs of blood loss.   7. Hypertensive heart disease with heart failure (HCC) -continues on losartan,   8. 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  Janene Harvey. Biagio Borg [facility] [phone]

## 2022-07-31 NOTE — Patient Instructions (Signed)
To call Dr Shea Evans and set up diabetic eye exam.

## 2022-08-01 LAB — COMPLETE METABOLIC PANEL WITH GFR
AG Ratio: 1.2 (calc) (ref 1.0–2.5)
ALT: 11 U/L (ref 6–29)
AST: 15 U/L (ref 10–35)
Albumin: 3.8 g/dL (ref 3.6–5.1)
Alkaline phosphatase (APISO): 55 U/L (ref 37–153)
BUN: 16 mg/dL (ref 7–25)
CO2: 27 mmol/L (ref 20–32)
Calcium: 9 mg/dL (ref 8.6–10.4)
Chloride: 102 mmol/L (ref 98–110)
Creat: 0.94 mg/dL (ref 0.60–0.95)
Globulin: 3.3 g/dL (calc) (ref 1.9–3.7)
Glucose, Bld: 87 mg/dL (ref 65–99)
Potassium: 4.7 mmol/L (ref 3.5–5.3)
Sodium: 139 mmol/L (ref 135–146)
Total Bilirubin: 0.6 mg/dL (ref 0.2–1.2)
Total Protein: 7.1 g/dL (ref 6.1–8.1)
eGFR: 61 mL/min/{1.73_m2} (ref >=60)

## 2022-08-01 LAB — MICROALBUMIN / CREATININE URINE RATIO
Creatinine, Urine: 119 mg/dL (ref 20–275)
Microalb Creat Ratio: 29 mg/g creat (ref <30)
Microalb, Ur: 3.4 mg/dL

## 2022-08-01 LAB — HEMOGLOBIN A1C
Hgb A1c MFr Bld: 6.4 % of total Hgb — ABNORMAL HIGH (ref <5.7)
Mean Plasma Glucose: 137 mg/dL
eAG (mmol/L): 7.6 mmol/L

## 2022-08-08 ENCOUNTER — Other Ambulatory Visit: Payer: Self-pay | Admitting: Nurse Practitioner

## 2022-08-08 DIAGNOSIS — G3184 Mild cognitive impairment, so stated: Secondary | ICD-10-CM

## 2022-08-08 DIAGNOSIS — F419 Anxiety disorder, unspecified: Secondary | ICD-10-CM

## 2022-08-14 NOTE — Progress Notes (Unsigned)
Cardiology Office Note:    Date:  08/18/2022   ID:  Brianna, Audibert [birth year], MRN [id number]  PCP:  [name], NP   [facility] Providers Cardiologist:  None     Referring MD: Dimple Nanas, MD   Chief Complaint  Patient presents with   svt    History of Present Illness:    [name] is a 82 y.o. female is seen at the request of Dr Nelson Chimes for evaluation of PAT. She has a history of COPD, GERD, PE, HLD, HTN, OSA-did not tolerate CPAP , and thyroid disease. She has a history of CKD with minimal change disease. She is on chronic tacrolimus. She was admitted in May with acute gallstone pancreatitis. Had ERCP and then lap choly. Post op developed hypoxia due to volume overload and atelectasis. She had atrial tachycardia during her hospital stay with strips reviewed from 5/6-5/11/24. Echo unremarkable. Was discharged on metoprolol. Later had an event monitor showing recurrent SVT and metoprolol dose was increased. She was asymptomatic.   On follow up today she is feeling much better. Notes she is still SOB secondary to COPD. Weight is stable and no edema. No chest pain. Denies palpitations or dizziness.   Past Medical History:  Diagnosis Date   Actinic keratosis    Arthritis    Asthma    Back injury 2019   Fracture   Basal cell carcinoma    COPD (chronic obstructive pulmonary disease) (HCC)    GERD (gastroesophageal reflux disease)    H/O blood clots 2020   Lung, Per [facility] New Patient Packet   H/O mammogram 2022   Per [facility] New Patient Packet   Heart murmur    hx of in childhood    High cholesterol    Hyperlipidemia    Hypertension    Hypothyroidism    Kidney disease    Per [facility] New Patient Packet   MCNS (minimal change nephrotic syndrome)    OSA (obstructive sleep apnea) 08/29/2019   Osteopenia    Shortness of breath dyspnea    on exertion    Squamous cell carcinoma    Thyroid disease    Per [facility] New Patient Packet    Past  Surgical History:  Procedure Laterality Date   ABDOMINAL HYSTERECTOMY     APPENDECTOMY     BILATERAL SALPINGOOPHORECTOMY     Ovarian Cysts    CHOLECYSTECTOMY N/A 06/17/2022   Procedure: LAPAROSCOPIC CHOLECYSTECTOMY;  Surgeon: Fritzi Mandes, MD;  Location: [facility];  Service: General;  Laterality: N/A;   COLONOSCOPY  2001   Dr.Mann, Per [facility] New Patient Packet   CONVERSION TO TOTAL KNEE Right 07/10/2014   Procedure: RIGHT CONVERSION OF PARTIAL KNEE TO TOTAL KNEE;  Surgeon: [name], MD;  Location: [facility];  Service: Orthopedics;  Laterality: Right;   ENDOSCOPIC RETROGRADE CHOLANGIOPANCREATOGRAPHY (ERCP) WITH PROPOFOL N/A 06/15/2022   Procedure: ENDOSCOPIC RETROGRADE CHOLANGIOPANCREATOGRAPHY (ERCP) WITH PROPOFOL;  Surgeon: Vida Rigger, MD;  Location: [facility];  Service: Gastroenterology;  Laterality: N/A;   IR ANGIOGRAM PULMONARY BILATERAL SELECTIVE  02/08/2019   IR ANGIOGRAM SELECTIVE EACH ADDITIONAL VESSEL  02/08/2019   IR ANGIOGRAM SELECTIVE EACH ADDITIONAL VESSEL  02/08/2019   IR INFUSION THROMBOL ARTERIAL INITIAL (MS)  02/08/2019   IR INFUSION THROMBOL ARTERIAL INITIAL (MS)  02/08/2019   IR THROMB F/U EVAL ART/VEN FINAL DAY (MS)  02/09/2019   IR US GUIDE VASC ACCESS RIGHT  02/08/2019   MEDIAL PARTIAL KNEE REPLACEMENT Bilateral  MOHS SURGERY     x 2   REMOVAL OF STONES  06/15/2022   Procedure: REMOVAL OF STONES;  Surgeon: Vida Rigger, MD;  Location: [facility];  Service: Gastroenterology;;   RENAL BIOPSY     x 2   REPLACEMENT TOTAL KNEE  2012   Per [facility] New Patient Packet   ROTATOR CUFF REPAIR Left 2008   SPHINCTEROTOMY  06/15/2022   Procedure: SPHINCTEROTOMY;  Surgeon: Vida Rigger, MD;  Location: [facility];  Service: Gastroenterology;;    Current Medications: Current Meds  Medication Sig   acetaminophen (TYLENOL) 500 MG tablet Take 2 tablets (1,000 mg total) by mouth every 6 (six) hours as needed.   albuterol (VENTOLIN HFA) 108 (90 Base) MCG/ACT inhaler Inhale 1 puff  into the lungs as needed for wheezing or shortness of breath.   Calcium Carb-Cholecalciferol (CALCIUM-VITAMIN D) 600-400 MG-UNIT TABS Take 1 tablet by mouth daily.   citalopram (CELEXA) 20 MG tablet Take 1 tablet (20 mg total) by mouth at bedtime. DX F41.9   donepezil (ARICEPT) 10 MG tablet TAKE 1 TABLET BY MOUTH EVERYDAY AT BEDTIME   ELIQUIS 5 MG TABS tablet TAKE 1 TABLET BY MOUTH TWICE A DAY   ferrous sulfate 325 (65 FE) MG EC tablet TAKE 1 TABLET BY MOUTH EVERY DAY WITH BREAKFAST   fluticasone (FLONASE) 50 MCG/ACT nasal spray INSTILL 1 SPRAY INTO BOTH NOSTRILS DAILY   Fluticasone-Umeclidin-Vilant (TRELEGY ELLIPTA) 100-62.5-25 MCG/ACT AEPB TAKE 1 PUFF BY MOUTH EVERY DAY   guaiFENesin (MUCINEX) 600 MG 12 hr tablet Take 1,200 mg by mouth daily.   ipratropium (ATROVENT) 0.03 % nasal spray SPRAY 2 SPRAYS INTO EACH NOSTRIL 3 TIMES A DAY AS NEEDED FOR RHINITIS   levothyroxine (SYNTHROID) 88 MCG tablet TAKE 1 TABLET BY MOUTH EVERY DAY IN THE MORNING   losartan (COZAAR) 100 MG tablet TAKE 1 TABLET BY MOUTH EVERY DAY   metFORMIN (GLUCOPHAGE) 500 MG tablet Take 1 tablet (500 mg total) by mouth daily with supper.   montelukast (SINGULAIR) 10 MG tablet TAKE 1 TABLET BY MOUTH EVERYDAY AT BEDTIME   Multiple Vitamin (MULTIVITAMIN WITH MINERALS) TABS tablet Take 1 tablet by mouth daily.   pantoprazole (PROTONIX) 40 MG tablet TAKE 1 TABLET BY MOUTH EVERY DAY   rOPINIRole (REQUIP) 1 MG tablet TAKE 1 TABLET BY MOUTH EVERYDAY AT BEDTIME   rosuvastatin (CRESTOR) 20 MG tablet Take 1 tablet (20 mg total) by mouth daily.   tacrolimus (PROGRAF) 1 MG capsule Take 1 mg by mouth 2 (two) times daily.    [DISCONTINUED] metoprolol tartrate (LOPRESSOR) 25 MG tablet Take 1.5 tablets (37.5 mg total) by mouth 2 (two) times daily.   [DISCONTINUED] rosuvastatin (CRESTOR) 10 MG tablet Take 2 tablets (20 mg total) by mouth daily. E78.2     Allergies:   Patient has no known allergies.   Social History   Socioeconomic History    Marital status: Widowed    Spouse name: Not on file   Number of children: 2   Years of education: 16   Highest education level: Not on file  Occupational History   Occupation: TEACHER     Employer: [employer]    Comment: RETIRED   Tobacco Use   Smoking status: Former    Packs/day: 1.50    Years: 25.00    Additional pack years: 0.00    Total pack years: 37.50    Types: Cigarettes    Quit date: 01/12/1973    Years since quitting: 49.6   Smokeless tobacco:  Never  Vaping Use   Vaping Use: Never used  Substance and Sexual Activity   Alcohol use: Yes    Alcohol/week: 0.5 standard drinks of alcohol    Types: 1 Standard drinks or equivalent per week    Comment: She occasionally drinks a glass of wine.     Drug use: No   Sexual activity: Not Currently    Partners: Male  Other Topics Concern   Not on file  Social History Narrative   Marital Status:  Widowed   Siblings: Kitty Simmons/ Rachel RuthChildren:  2;  Grandchildren:  5 Pets: NoneLiving Situation: Lives aloneOccupation: Retired Public affairs consultant: Engineer, maintenance (IT) Tobacco Use/Exposure:  She quit smoking > 20 years ago after having smoked 1 1/2 ppd for over 20 years.  Alcohol Use:  Occasional (Wine) Drug Use:  NoneDiet:  RegularExercise:  Water Aerobic Class 3 days a week and WeightsHobbies: Reading/ Crafts/ Biking/ Reading]      Per [facility] New patient packet abstracted on 11/01/2020   Diet: Left blank      Caffeine: Yes      Married, if yes what year: Widow, married in 1961      Do you live in a house, apartment, assisted living, condo, trailer, ect: Patient did not answer correctly (lived alone until 10/1/20222)      Is it one or more stories: 2      How many persons live in your home? 2      Pets: cat      Highest level or education completed: 1 year of college       Current/Past profession: Geologist, engineering       Exercise:      No            Type and how often:          Living Will: Yes   DNR: Yes    POA/HPOA: Yes      Functional Status:   Do you have difficulty bathing or dressing yourself?  Left blank   Do you have difficulty preparing food or eating?Left blank   Do you have difficulty managing your medications?Left blank   Do you have difficulty managing your finances?Left blank   Do you have difficulty affording your medications?Left blank   Social Determinants of Health   Financial Resource Strain: Not on file  Food Insecurity: No Food Insecurity (06/13/2022)   Hunger Vital Sign    Worried About Running Out of Food in the Last Year: Never true    Ran Out of Food in the Last Year: Never true  Transportation Needs: No Transportation Needs (06/13/2022)   PRAPARE - Administrator, Civil Service (Medical): No    Lack of Transportation (Non-Medical): No  Physical Activity: Not on file  Stress: Not on file  Social Connections: Not on file     Family History: The patient's family history includes COPD in her mother; Heart disease in her paternal grandfather; Hypertension in her sister; Lymphoma in her father; Rheumatic fever in her paternal grandmother; Scoliosis in her daughter; Stroke in her maternal grandfather.  ROS:   Please see the history of present illness.     All other systems reviewed and are negative.  EKGs/Labs/Other Studies Reviewed:    The following studies were reviewed today: Event monitor 07/17/22: Study Highlights    Patch Wear Time:  13 days and 23 hours (2024-05-15T14:27:01-399 to 2024-05-29T14:27:09-398)   Patient had a min HR of 49 bpm, max HR of 197 bpm,  and avg HR of 66 bpm. Predominant underlying rhythm was Sinus Rhythm. 1 run of Ventricular Tachycardia occurred lasting 22.1 secs with a max rate of 158 bpm (avg 115 bpm). 1516 Supraventricular  Tachycardia runs occurred, the run with the fastest interval lasting 12.1 secs with a max rate of 197 bpm, the longest lasting 38.9 secs with an avg rate of 116 bpm. Isolated SVEs were occasional (3.4%,  45187), SVE Couplets were occasional (2.4%, 16459),  and SVE Triplets were rare (<1.0%, 1104). Isolated VEs were rare (<1.0%, 2324), VE Couplets were rare (<1.0%, 20), and VE Triplets were rare (<1.0%, 2). Ventricular Bigeminy and Trigeminy were present.    1 run of wide-complex tachycardia (115 bpm) lasting 22 seconds (could VT or SVT with aberrancy) - also, numerous PSVT runs - over 1500 runs were recorded, lasting up to 38 seconds again with an average rate of 116 bpm.    Echo 06/15/22: IMPRESSIONS     1. Left ventricular ejection fraction, by estimation, is 50 to 55%. The  left ventricle has low normal function. The left ventricle has no regional  wall motion abnormalities. There is mild concentric left ventricular  hypertrophy. Left ventricular  diastolic parameters are consistent with Grade I diastolic dysfunction  (impaired relaxation).   2. Right ventricular systolic function is normal. The right ventricular  size is normal. Tricuspid regurgitation signal is inadequate for assessing  PA pressure.   3. Left atrial size was mildly dilated.   4. Right atrial size was mildly dilated.   5. The mitral valve is normal in structure. Trivial mitral valve  regurgitation. No evidence of mitral stenosis.   6. The aortic valve is tricuspid. There is mild calcification of the  aortic valve. Aortic valve regurgitation is not visualized. Mild aortic  valve stenosis. Aortic valve area, by VTI measures 1.67 cm. Aortic valve  mean gradient measures 9.0 mmHg.   7. The inferior vena cava is dilated in size with <50% respiratory  variability, suggesting right atrial pressure of 15 mmHg.         Recent Labs: 01/19/2022: TSH 2.68 06/19/2022: B Natriuretic Peptide 139.2 06/21/2022: Magnesium 1.8 06/22/2022: Hemoglobin 13.7; Platelets 382 07/31/2022: ALT 11; BUN 16; Creat 0.94; Potassium 4.7; Sodium 139  Recent Lipid Panel    Component Value Date/Time   CHOL 125 01/19/2022 0851   TRIG 145 01/19/2022  0851   HDL 63 01/19/2022 0851   CHOLHDL 2.0 01/19/2022 0851   VLDL 24 06/12/2013 0823   LDLCALC 39 01/19/2022 0851     Risk Assessment/Calculations:     Physical Exam:    VS:  BP (!) 142/78   Pulse (!) 56   Ht 5\' 5"  (1.651 m)   Wt 214 lb 6.4 oz (97.3 kg)   SpO2 95%   BMI 35.68 kg/m     Wt Readings from Last 3 Encounters:  08/18/22 214 lb 6.4 oz (97.3 kg)  07/31/22 215 lb (97.5 kg)  06/22/22 217 lb 4 oz (98.5 kg)     GEN:  Well nourished, obese,  in no acute distress HEENT: Normal NECK: No JVD; No carotid bruits LYMPHATICS: No lymphadenopathy CARDIAC: RRR, no murmurs, rubs, gallops RESPIRATORY:  Clear to auscultation without rales, wheezing or rhonchi  ABDOMEN: Soft, non-tender, non-distended MUSCULOSKELETAL:  No edema; No deformity  SKIN: Warm and dry NEUROLOGIC:  Alert and oriented x 3 PSYCHIATRIC:  Normal affect   ASSESSMENT:    1. PAT (paroxysmal atrial tachycardia)   2. Chronic obstructive pulmonary disease, unspecified  COPD type (HCC)    PLAN:    In order of problems listed above:  PAT- noted initially in hospital when under a lot of CV stress. Still with recurrent PAT on follow up event monitor. Now on metoprolol. Rate well controlled. No current symptoms. Predisposed to PAT given lung disease, history of PE and thyroid disease. Echo unremarkable. Volume overload in the setting of aggressive volume resuscitation with pancreatitis. No real history of CHF. She surely has some diastolic dysfunction due to age but Echo looked ok. Volume status now normal. Not on diuretic.  COPD on Trelegy History of CKD with minimal change disease. Managed with Tacrolimus History of gallstone pancreatitis. S/p lap choly. Resolved.  History of PE on chronic Eliquis.          Follow up in 4 months with APP  Medication Adjustments/Labs and Tests Ordered: Current medicines are reviewed at length with the patient today.  Concerns regarding medicines are outlined above.  No  orders of the defined types were placed in this encounter.  Meds ordered this encounter  Medications   metoprolol tartrate (LOPRESSOR) 25 MG tablet    Sig: Take 1.5 tablets (37.5 mg total) by mouth 2 (two) times daily.    Dispense:  180 tablet    Refill:  3   rosuvastatin (CRESTOR) 20 MG tablet    Sig: Take 1 tablet (20 mg total) by mouth daily.    Dispense:  90 tablet    Refill:  3    Patient Instructions  Medication Instructions:  Continue same medications *If you need a refill on your cardiac medications before your next appointment, please call your pharmacy*   Lab Work: None ordered   Testing/Procedures: None ordered   Follow-Up: At [facility], you and your health needs are our priority.  As part of our continuing mission to provide you with exceptional heart care, we have created designated Provider Care Teams.  These Care Teams include your primary Cardiologist (physician) and Advanced Practice Providers (APPs -  Physician Assistants and Nurse Practitioners) who all work together to provide you with the care you need, when you need it.  We recommend signing up for the patient portal called "MyChart".  Sign up information is provided on this After Visit Summary.  MyChart is used to connect with patients for Virtual Visits (Telemedicine).  Patients are able to view lab/test results, encounter notes, upcoming appointments, etc.  Non-urgent messages can be sent to your provider as well.   To learn more about what you can do with MyChart, go to [email or web address].    Your next appointment:  4 months     Provider:  Dr.Brionne Mertz's PA     Signed, Qiana Landgrebe [name], MD  08/18/2022 4:36 PM    [facility]

## 2022-08-17 ENCOUNTER — Other Ambulatory Visit: Payer: Self-pay | Admitting: Nurse Practitioner

## 2022-08-17 ENCOUNTER — Other Ambulatory Visit: Payer: Self-pay | Admitting: Pulmonary Disease

## 2022-08-18 ENCOUNTER — Ambulatory Visit: Payer: Medicare PPO | Attending: Cardiology | Admitting: Cardiology

## 2022-08-18 ENCOUNTER — Encounter: Payer: Self-pay | Admitting: Cardiology

## 2022-08-18 VITALS — BP 142/78 | HR 56 | Ht 65.0 in | Wt 214.4 lb

## 2022-08-18 DIAGNOSIS — J449 Chronic obstructive pulmonary disease, unspecified: Secondary | ICD-10-CM

## 2022-08-18 DIAGNOSIS — I4719 Other supraventricular tachycardia: Secondary | ICD-10-CM

## 2022-08-18 MED ORDER — METOPROLOL TARTRATE 25 MG PO TABS: 37.5000 mg | ORAL_TABLET | Freq: Two times a day (BID) | ORAL | 3 refills | Status: DC

## 2022-08-18 MED ORDER — ROSUVASTATIN CALCIUM 20 MG PO TABS: 20.0000 mg | ORAL_TABLET | Freq: Every day | ORAL | 3 refills | Status: DC

## 2022-08-18 NOTE — Patient Instructions (Signed)
Medication Instructions:  Continue same medications *If you need a refill on your cardiac medications before your next appointment, please call your pharmacy*   Lab Work: None ordered   Testing/Procedures: None ordered   Follow-Up: At [facility], you and your health needs are our priority.  As part of our continuing mission to provide you with exceptional heart care, we have created designated Provider Care Teams.  These Care Teams include your primary Cardiologist (physician) and Advanced Practice Providers (APPs -  Physician Assistants and Nurse Practitioners) who all work together to provide you with the care you need, when you need it.  We recommend signing up for the patient portal called "MyChart".  Sign up information is provided on this After Visit Summary.  MyChart is used to connect with patients for Virtual Visits (Telemedicine).  Patients are able to view lab/test results, encounter notes, upcoming appointments, etc.  Non-urgent messages can be sent to your provider as well.   To learn more about what you can do with MyChart, go to [email or web address].    Your next appointment:  4 months    Provider: Dr.Jordan's PA     

## 2022-08-19 ENCOUNTER — Telehealth: Payer: Medicare PPO

## 2022-08-19 NOTE — Telephone Encounter (Signed)
Tried Occupational hygienist, no answer. I left message for someone to give me a call here at our office.

## 2022-08-19 NOTE — Telephone Encounter (Signed)
Patient called wanting to know if you had gotten in contact with her oxygen supplier in order for them to cancel her oxygen.  Message sent to Abbey Chatters, NP

## 2022-08-19 NOTE — Telephone Encounter (Signed)
No, okay to call the supplier to dc at this time.

## 2022-08-20 NOTE — Telephone Encounter (Signed)
Tried calling [employer] again, but I called during lunch hours. Office will open back up at 1:00. I left another message requesting a call regarding cancelling order for oxygen.

## 2022-08-21 ENCOUNTER — Other Ambulatory Visit: Payer: Self-pay | Admitting: Nurse Practitioner

## 2022-08-21 DIAGNOSIS — Z1231 Encounter for screening mammogram for malignant neoplasm of breast: Secondary | ICD-10-CM

## 2022-08-21 NOTE — Telephone Encounter (Signed)
I have placed another call to [employer] and has to leave another message.

## 2022-08-22 DIAGNOSIS — I2699 Other pulmonary embolism without acute cor pulmonale: Secondary | ICD-10-CM | POA: Diagnosis not present

## 2022-08-22 DIAGNOSIS — G4733 Obstructive sleep apnea (adult) (pediatric): Secondary | ICD-10-CM | POA: Diagnosis not present

## 2022-08-22 DIAGNOSIS — K859 Acute pancreatitis without necrosis or infection, unspecified: Secondary | ICD-10-CM | POA: Diagnosis not present

## 2022-08-22 DIAGNOSIS — K219 Gastro-esophageal reflux disease without esophagitis: Secondary | ICD-10-CM | POA: Diagnosis not present

## 2022-08-24 ENCOUNTER — Encounter: Payer: Self-pay | Admitting: Nurse Practitioner

## 2022-08-24 NOTE — Telephone Encounter (Signed)
I spoke with a representative from [employer] and they stated that they needed a letter that stated order needs to be discontinued. Letter was typed and sent to [facility]

## 2022-08-25 ENCOUNTER — Other Ambulatory Visit: Payer: Self-pay | Admitting: Pulmonary Disease

## 2022-08-25 DIAGNOSIS — J449 Chronic obstructive pulmonary disease, unspecified: Secondary | ICD-10-CM

## 2022-08-25 DIAGNOSIS — E119 Type 2 diabetes mellitus without complications: Secondary | ICD-10-CM | POA: Diagnosis not present

## 2022-08-25 LAB — HM DIABETES EYE EXAM

## 2022-08-27 NOTE — Telephone Encounter (Signed)
Patient called and stated that [employer] needed a letter to be faxed to Fax: [phone] to discontinue her Oxygen.   Letter Refaxed to the given number.

## 2022-09-08 DIAGNOSIS — C44529 Squamous cell carcinoma of skin of other part of trunk: Secondary | ICD-10-CM | POA: Diagnosis not present

## 2022-09-14 ENCOUNTER — Other Ambulatory Visit: Payer: Self-pay | Admitting: Nurse Practitioner

## 2022-09-14 ENCOUNTER — Other Ambulatory Visit: Payer: Self-pay | Admitting: Pulmonary Disease

## 2022-09-22 DIAGNOSIS — E1122 Type 2 diabetes mellitus with diabetic chronic kidney disease: Secondary | ICD-10-CM | POA: Diagnosis not present

## 2022-09-22 DIAGNOSIS — Z87441 Personal history of nephrotic syndrome: Secondary | ICD-10-CM | POA: Diagnosis not present

## 2022-09-22 DIAGNOSIS — N1831 Chronic kidney disease, stage 3a: Secondary | ICD-10-CM | POA: Diagnosis not present

## 2022-09-22 DIAGNOSIS — I129 Hypertensive chronic kidney disease with stage 1 through stage 4 chronic kidney disease, or unspecified chronic kidney disease: Secondary | ICD-10-CM | POA: Diagnosis not present

## 2022-09-23 ENCOUNTER — Ambulatory Visit (INDEPENDENT_AMBULATORY_CARE_PROVIDER_SITE_OTHER): Payer: Medicare PPO | Admitting: Sports Medicine

## 2022-09-23 ENCOUNTER — Encounter: Payer: Self-pay | Admitting: Sports Medicine

## 2022-09-23 VITALS — BP 130/78 | HR 56 | Temp 97.9°F | Ht 65.0 in | Wt 213.6 lb

## 2022-09-23 DIAGNOSIS — J449 Chronic obstructive pulmonary disease, unspecified: Secondary | ICD-10-CM | POA: Diagnosis not present

## 2022-09-23 DIAGNOSIS — R051 Acute cough: Secondary | ICD-10-CM

## 2022-09-23 LAB — POCT INFLUENZA A/B
Influenza A, POC: NEGATIVE
Influenza B, POC: NEGATIVE

## 2022-09-23 LAB — POC COVID19 BINAXNOW: SARS Coronavirus 2 Ag: NEGATIVE

## 2022-09-23 MED ORDER — BENZONATATE 200 MG PO CAPS
200.0000 mg | ORAL_CAPSULE | Freq: Two times a day (BID) | ORAL | 0 refills | Status: DC | PRN
Start: 2022-09-23 — End: 2022-10-13

## 2022-09-23 MED ORDER — AZITHROMYCIN 250 MG PO TABS
ORAL_TABLET | ORAL | 0 refills | Status: AC
Start: 2022-09-23 — End: 2022-09-28

## 2022-09-23 NOTE — Progress Notes (Signed)
Careteam: Patient Care Team: [name], NP as PCP - General (Geriatric Medicine) [name], MD as Consulting Physician (Orthopedic Surgery) [name], MD as Consulting Physician (Dermatology) [name], MD as Referring Physician (Nephrology) [name], MD as Consulting Physician (Pulmonary Disease) [name] (Optometry)  PLACE OF SERVICE:  [facility]  Advanced Directive information    No Known Allergies  Chief Complaint  Patient presents with   Acute Visit    Patient c/o cough x 2 weeks. Denies fever. Patient performed an at home covid test when symptoms first onset and it was negative. Discuss mucinex and recommended dosing daily versus as needed. Discuss iron supplement, clarify time of day to take. Patient also c/o being fatigued/low energy.      HPI: Patient is a 82 y.o. female with h/o COPD , minimal change disease , COPD presented to clinic for cough  Pt is accompanied by her daughter  Cough x 2 weeks Reports poor appetite No sick contacts Nasal congestion with post nasal drip+ Denies fevers, chills, sore throat Feels fatigue  Denies myalgias, sinus pressure, dizzy or lightheaded Tied mucinex, tylenol  Pt has h/o COPD , has not required to use albuterol  She is on Trelligy  No recent COPD exacerbations     Review of Systems:  Review of Systems  Constitutional:  Negative for chills and fever.  HENT:  Positive for congestion. Negative for sore throat.   Eyes:  Negative for double vision.  Respiratory:  Positive for cough and sputum production. Negative for shortness of breath.   Cardiovascular:  Negative for chest pain, palpitations and leg swelling.  Gastrointestinal:  Negative for abdominal pain, heartburn and nausea.  Genitourinary:  Negative for dysuria, frequency and hematuria.  Musculoskeletal:  Negative for myalgias.  Neurological:  Negative for dizziness, sensory change and focal weakness.     Past Medical History:   Diagnosis Date   Actinic keratosis    Arthritis    Asthma    Back injury 2019   Fracture   Basal cell carcinoma    COPD (chronic obstructive pulmonary disease) (HCC)    GERD (gastroesophageal reflux disease)    H/O blood clots 2020   Lung, Per [facility] New Patient Packet   H/O mammogram 2022   Per [facility] New Patient Packet   Heart murmur    hx of in childhood    High cholesterol    Hyperlipidemia    Hypertension    Hypothyroidism    Kidney disease    Per [facility] New Patient Packet   MCNS (minimal change nephrotic syndrome)    OSA (obstructive sleep apnea) 08/29/2019   Osteopenia    Shortness of breath dyspnea    on exertion    Squamous cell carcinoma    Thyroid disease    Per [facility] New Patient Packet   Past Surgical History:  Procedure Laterality Date   ABDOMINAL HYSTERECTOMY     APPENDECTOMY     BILATERAL SALPINGOOPHORECTOMY     Ovarian Cysts    CHOLECYSTECTOMY N/A 06/17/2022   Procedure: LAPAROSCOPIC CHOLECYSTECTOMY;  Surgeon: Fritzi Mandes, MD;  Location: [facility];  Service: General;  Laterality: N/A;   COLONOSCOPY  2001   Dr.Mann, Per [facility] New Patient Packet   CONVERSION TO TOTAL KNEE Right 07/10/2014   Procedure: RIGHT CONVERSION OF PARTIAL KNEE TO TOTAL KNEE;  Surgeon: [name], MD;  Location: [facility];  Service: Orthopedics;  Laterality: Right;   ENDOSCOPIC RETROGRADE CHOLANGIOPANCREATOGRAPHY (ERCP) WITH PROPOFOL N/A  06/15/2022   Procedure: ENDOSCOPIC RETROGRADE CHOLANGIOPANCREATOGRAPHY (ERCP) WITH PROPOFOL;  Surgeon: Vida Rigger, MD;  Location: [facility];  Service: Gastroenterology;  Laterality: N/A;   IR ANGIOGRAM PULMONARY BILATERAL SELECTIVE  02/08/2019   IR ANGIOGRAM SELECTIVE EACH ADDITIONAL VESSEL  02/08/2019   IR ANGIOGRAM SELECTIVE EACH ADDITIONAL VESSEL  02/08/2019   IR INFUSION THROMBOL ARTERIAL INITIAL (MS)  02/08/2019   IR INFUSION THROMBOL ARTERIAL INITIAL (MS)  02/08/2019   IR THROMB F/U EVAL ART/VEN FINAL DAY (MS)  02/09/2019   IR US GUIDE VASC ACCESS  RIGHT  02/08/2019   MEDIAL PARTIAL KNEE REPLACEMENT Bilateral    MOHS SURGERY     x 2   REMOVAL OF STONES  06/15/2022   Procedure: REMOVAL OF STONES;  Surgeon: Vida Rigger, MD;  Location: [facility];  Service: Gastroenterology;;   RENAL BIOPSY     x 2   REPLACEMENT TOTAL KNEE  2012   Per [facility] New Patient Packet   ROTATOR CUFF REPAIR Left 2008   SPHINCTEROTOMY  06/15/2022   Procedure: SPHINCTEROTOMY;  Surgeon: Vida Rigger, MD;  Location: [facility];  Service: Gastroenterology;;   Social History:   reports that she quit smoking about 49 years ago. Her smoking use included cigarettes. She started smoking about 74 years ago. She has a 37.5 pack-year smoking history. She has never used smokeless tobacco. She reports current alcohol use of about 0.5 standard drinks of alcohol per week. She reports that she does not use drugs.  Family History  Problem Relation Age of Onset   COPD Mother        Husband Smoked    Lymphoma Father    Hypertension Sister    Scoliosis Daughter    Stroke Maternal Grandfather    Rheumatic fever Paternal Grandmother    Heart disease Paternal Grandfather     Medications: Patient's Medications  New Prescriptions   AZITHROMYCIN (ZITHROMAX) 250 MG TABLET    Take 2 tablets on day 1, then 1 tablet daily on days 2 through 5   BENZONATATE (TESSALON) 200 MG CAPSULE    Take 1 capsule (200 mg total) by mouth 2 (two) times daily as needed for cough.  Previous Medications   ACETAMINOPHEN (TYLENOL) 500 MG TABLET    Take 2 tablets (1,000 mg total) by mouth every 6 (six) hours as needed.   ALBUTEROL (VENTOLIN HFA) 108 (90 BASE) MCG/ACT INHALER    Inhale 1 puff into the lungs as needed for wheezing or shortness of breath.   CALCIUM CARB-CHOLECALCIFEROL (CALCIUM-VITAMIN D) 600-400 MG-UNIT TABS    Take 1 tablet by mouth daily.   CITALOPRAM (CELEXA) 20 MG TABLET    Take 1 tablet (20 mg total) by mouth at bedtime. DX F41.9   DONEPEZIL (ARICEPT) 10 MG TABLET    TAKE 1 TABLET BY  MOUTH EVERYDAY AT BEDTIME   ELIQUIS 5 MG TABS TABLET    TAKE 1 TABLET BY MOUTH TWICE A DAY   FERROUS SULFATE 325 (65 FE) MG EC TABLET    TAKE 1 TABLET BY MOUTH EVERY DAY WITH BREAKFAST   FLUTICASONE (FLONASE) 50 MCG/ACT NASAL SPRAY    SPRAY 1 SPRAY INTO EACH NOSTRIL DAILY   GUAIFENESIN (MUCINEX) 600 MG 12 HR TABLET    Take 1,200 mg by mouth as needed.   IPRATROPIUM (ATROVENT) 0.03 % NASAL SPRAY    SPRAY 2 SPRAYS INTO EACH NOSTRIL 3 TIMES A DAY AS NEEDED FOR RHINITIS   LEVOTHYROXINE (SYNTHROID) 88 MCG TABLET    TAKE 1 TABLET  BY MOUTH EVERY DAY IN THE MORNING   LOSARTAN (COZAAR) 100 MG TABLET    TAKE 1 TABLET BY MOUTH EVERY DAY   METFORMIN (GLUCOPHAGE) 500 MG TABLET    Take 1 tablet (500 mg total) by mouth daily with supper.   METOPROLOL TARTRATE (LOPRESSOR) 25 MG TABLET    TAKE 1 TABLET BY MOUTH TWICE A DAY   MONTELUKAST (SINGULAIR) 10 MG TABLET    TAKE 1 TABLET BY MOUTH EVERYDAY AT BEDTIME   MULTIPLE VITAMIN (MULTIVITAMIN WITH MINERALS) TABS TABLET    Take 1 tablet by mouth daily.   PANTOPRAZOLE (PROTONIX) 40 MG TABLET    TAKE 1 TABLET BY MOUTH EVERY DAY   ROPINIROLE (REQUIP) 1 MG TABLET    TAKE 1 TABLET BY MOUTH EVERYDAY AT BEDTIME   ROSUVASTATIN (CRESTOR) 20 MG TABLET    Take 1 tablet (20 mg total) by mouth daily.   TACROLIMUS (PROGRAF) 1 MG CAPSULE    Take 1 mg by mouth 2 (two) times daily.    TRELEGY ELLIPTA 100-62.5-25 MCG/ACT AEPB    INHALE 1 PUFF BY MOUTH EVERY DAY  Modified Medications   No medications on file  Discontinued Medications   No medications on file    Physical Exam:  Vitals:   09/23/22 1145  BP: 130/78  Pulse: (!) 56  Temp: 97.9 F (36.6 C)  TempSrc: Temporal  SpO2: 95%  Weight: 213 lb 9.6 oz (96.9 kg)  Height: 5\' 5"  (1.651 m)   Body mass index is 35.54 kg/m. Wt Readings from Last 3 Encounters:  09/23/22 213 lb 9.6 oz (96.9 kg)  08/18/22 214 lb 6.4 oz (97.3 kg)  07/31/22 215 lb (97.5 kg)    Physical Exam Constitutional:      Appearance: Normal  appearance.  HENT:     Head: Normocephalic and atraumatic.  Cardiovascular:     Rate and Rhythm: Normal rate and regular rhythm.     Heart sounds: No murmur heard. Pulmonary:     Effort: Pulmonary effort is normal. No respiratory distress.     Breath sounds: Normal breath sounds. No wheezing.  Abdominal:     General: Bowel sounds are normal. There is no distension.     Tenderness: There is no abdominal tenderness. There is no guarding or rebound.  Musculoskeletal:        General: No swelling or tenderness.  Skin:    General: Skin is dry.  Neurological:     Mental Status: She is alert. Mental status is at baseline.     Sensory: No sensory deficit.     Motor: No weakness.      Labs reviewed: Basic Metabolic Panel: Recent Labs    01/19/22 0851 06/13/22 0409 06/15/22 0400 06/16/22 0401 06/17/22 0358 06/18/22 0416 06/19/22 0415 06/20/22 0352 06/21/22 0352 06/22/22 1521 07/31/22 1533  NA 141   < > 137 134* 136   < > 140 141 141 143 139  K 4.4   < > 4.3 4.8 3.3*   < > 3.9 3.6 4.1 4.2 4.7  CL 105   < > 102 99 98   < > 101 98 100 103 102  CO2 30   < > 28 28 29    < > 31 34* 31 27 27   GLUCOSE 137*   < > 102* 149* 135*   < > 133* 131* 142* 132* 87  BUN 17   < > 14 16 14    < > 20 19 19 20 16   CREATININE 0.98*   < >  0.92 0.91 0.91   < > 1.01* 0.95 1.06* 0.96* 0.94  CALCIUM 8.3*   < > 8.2* 8.3* 8.0*   < > 8.0* 8.1* 8.4* 9.3 9.0  MG  --    < > 1.8 1.9 1.5*   < > 1.8 1.5* 1.8  --   --   PHOS  --    < > 3.8 4.6 3.2  --   --   --   --   --   --   TSH 2.68  --   --   --   --   --   --   --   --   --   --    < > = values in this interval not displayed.   Liver Function Tests: Recent Labs    06/19/22 0415 06/20/22 0352 06/21/22 0352 07/31/22 1533  AST 26 21 18 15   ALT 32 31 25 11   ALKPHOS 35* 41 46  --   BILITOT 0.5 0.6 0.7 0.6  PROT 6.2* 6.7 7.1 7.1  ALBUMIN 2.8* 2.9* 3.1*  --    Recent Labs    06/13/22 0800 06/15/22 1015  LIPASE 3,094* 47   No results for input(s):  "AMMONIA" in the last 8760 hours. CBC: Recent Labs    06/15/22 0400 06/16/22 0401 06/17/22 0358 06/18/22 0416 06/20/22 0352 06/21/22 0352 06/22/22 1521  WBC 11.1* 6.8 11.0*   < > 9.8 10.1 10.7  NEUTROABS 7.7 5.6 8.0*  --   --   --   --   HGB 12.3 11.1* 10.9*   < > 11.4* 12.1 13.7  HCT 40.6 37.2 36.2   < > 38.6 40.2 42.8  MCV 91.4 90.7 90.0   < > 90.6 89.7 84.4  PLT 242 221 241   < > 273 323 382   < > = values in this interval not displayed.   Lipid Panel: Recent Labs    01/19/22 0851  CHOL 125  HDL 63  LDLCALC 39  TRIG 145  CHOLHDL 2.0   TSH: Recent Labs    01/19/22 0851  TSH 2.68   A1C: Lab Results  Component Value Date   HGBA1C 6.4 (H) 07/31/2022     Assessment/Plan   1. Chronic obstructive pulmonary disease, unspecified COPD type (HCC) Lungs clear,  No wheezing  Cont with trelligy  Will give zpak - azithromycin (ZITHROMAX) 250 MG tablet; Take 2 tablets on day 1, then 1 tablet daily on days 2 through 5  Dispense: 6 tablet; Refill: 0  2. Acute cough FLU/Covid neg Pt with productive cough since 2 weeks  Tried mucinex Will send tessalon - azithromycin (ZITHROMAX) 250 MG tablet; Take 2 tablets on day 1, then 1 tablet daily on days 2 through 5  Dispense: 6 tablet; Refill: 0 - benzonatate (TESSALON) 200 MG capsule; Take 1 capsule (200 mg total) by mouth 2 (two) times daily as needed for cough.  Dispense: 20 capsule; Refill: 0  Follow up if no improvement    I spent greater than 30 minutes for the care of this patient in face to face time, chart review, clinical documentation, patient education.

## 2022-09-25 ENCOUNTER — Ambulatory Visit
Admission: RE | Admit: 2022-09-25 | Discharge: 2022-09-25 | Disposition: A | Discharge status: Home / Self Care | Payer: Medicare PPO | Source: Ambulatory Visit | Attending: Nurse Practitioner | Admitting: Nurse Practitioner

## 2022-09-25 DIAGNOSIS — Z1231 Encounter for screening mammogram for malignant neoplasm of breast: Secondary | ICD-10-CM | POA: Diagnosis not present

## 2022-10-13 ENCOUNTER — Ambulatory Visit (HOSPITAL_BASED_OUTPATIENT_CLINIC_OR_DEPARTMENT_OTHER): Payer: Medicare PPO | Admitting: Pulmonary Disease

## 2022-10-13 ENCOUNTER — Encounter (HOSPITAL_BASED_OUTPATIENT_CLINIC_OR_DEPARTMENT_OTHER): Payer: Self-pay | Admitting: Pulmonary Disease

## 2022-10-13 VITALS — BP 138/72 | HR 71 | Resp 18 | Ht 65.0 in | Wt 215.0 lb

## 2022-10-13 DIAGNOSIS — J4540 Moderate persistent asthma, uncomplicated: Secondary | ICD-10-CM | POA: Diagnosis not present

## 2022-10-13 DIAGNOSIS — J432 Centrilobular emphysema: Secondary | ICD-10-CM

## 2022-10-13 DIAGNOSIS — J310 Chronic rhinitis: Secondary | ICD-10-CM | POA: Diagnosis not present

## 2022-10-13 NOTE — Patient Instructions (Signed)
Follow up in 1 year.

## 2022-10-13 NOTE — Progress Notes (Signed)
[place] [facility], Critical Care, and Sleep Medicine  Chief Complaint  Patient presents with   Follow-up    Centrilobular emphysema- no longer on CPAP. Has been doing good but had gallbladder surgery in May. She had a cold that was treated with zpak and tessalon but the cough is still there.     Past Surgical History:  She  has a past surgical history that includes Rotator cuff repair (Left, 2008); Renal biopsy; Medial partial knee replacement (Bilateral); Abdominal hysterectomy; Bilateral salpingoophorectomy; Mohs surgery; Appendectomy; Conversion to total knee (Right, 07/10/2014); IR Angiogram Selective Each Additional Vessel (02/08/2019); IR INFUSION THROMBOL ARTERIAL INITIAL (MS) (02/08/2019); IR INFUSION THROMBOL ARTERIAL INITIAL (MS) (02/08/2019); IR Angiogram Pulmonary Bilateral Selective (02/08/2019); IR US Guide Vasc Access Right (02/08/2019); IR Angiogram Selective Each Additional Vessel (02/08/2019); IR THROMB F/U EVAL ART/VEN FINAL DAY (MS) (02/09/2019); Replacement total knee (2012); Colonoscopy (2001); Cholecystectomy (N/A, 06/17/2022); Endoscopic retrograde cholangiopancreatography (ercp) with propofol (N/A, 06/15/2022); sphincterotomy (06/15/2022); and removal of stones (06/15/2022).  Past Medical History:  OA, COPD, GERD, HLD, HTN, Hypothyroidism, Nephrotic syndrome, Osteopenia  Constitutional:  BP 138/72   Pulse 71   Resp 18   Ht 5\' 5"  (1.651 m)   Wt 215 lb (97.5 kg)   SpO2 93%   BMI 35.78 kg/m   Brief Summary:  [name] is a 82 y.o. female former smoker with asthma, emphysema, pulmonary embolism, and dyspnea on exertion.  She has a history of obstructive sleep apnea, but wasn't able to tolerate CPAP therapy.      Subjective:   She had a cold a couple of weeks ago.  Treated with a Zpak.  Feels better, but still has a dry cough.  Has sinus congestion still with drainage.  Occasionally gets up clear phlegm.  Not having wheeze, chest pain, fever, or  hemoptysis.  Sleeping well.  Has lost about 20 lbs since she was in hospital.  Keeps up with exercises she learned at PT.  Physical Exam:   Appearance - well kempt   ENMT - no sinus tenderness, no oral exudate, no LAN, Mallampati 3 airway, no stridor  Respiratory - equal breath sounds bilaterally, no wheezing or rales  CV - s1s2 regular rate and rhythm, no murmurs  Ext - no clubbing, no edema  Skin - no rashes  Psych - normal mood and affect      Pulmonary testing:  PFT 06/13/19 >> FEV1 1.45 (66%), FEV1% 81, TLC 3.85 (71%), DLCO 55%  Chest Imaging:  CT chest 07/14/19 >> mild emphysema, small hiatal hernia  Sleep Tests:  PSG 08/05/19 >> AHI 26.3, SpO2 low 85% Auto CPAP 10/11/19 to 11/02/19 >> used on 23 of 23 nights with average 9 hrs 12 min.  Average AHI 3.5 with median CPAP 8 and 95 th percentile CPAP 10 cm H2O  Cardiac Tests:  Echo 06/15/22 >> EF 50 to 55%, grade 1 DD, mild LA/RA dilation, mild AS  Social History:  She  reports that she quit smoking about 49 years ago. Her smoking use included cigarettes. She started smoking about 74 years ago. She has a 37.5 pack-year smoking history. She has never used smokeless tobacco. She reports current alcohol use of about 0.5 standard drinks of alcohol per week. She reports that she does not use drugs.  Family History:  Her family history includes COPD in her mother; Heart disease in her paternal grandfather; Hypertension in her sister; Lymphoma in her father; Rheumatic fever in her paternal grandmother; Scoliosis in her daughter;  Stroke in her maternal grandfather.     Assessment/Plan:   Emphysema, asthma. - continue trelegy 100 one puff daily and singulair 10 mg nightly - prn albuterol  Chronic rhinitis. - continue astepro and flonase - prn mucinex   Hx of pulmonary embolism. - she will continue with eliquis indefinitely  CKD 3a, Minimal change disease. - followed by Nephrology at [facility] - remains on  tacrolimus  Time Spent Involved in Patient Care on Day of Examination:  25 minutes  Follow up:   Patient Instructions  Follow up in 1 year  Medication List:   Allergies as of 10/13/2022   No Known Allergies      Medication List        Accurate as of October 13, 2022  3:38 PM. If you have any questions, ask your nurse or doctor.          STOP taking these medications    acetaminophen 500 MG tablet Commonly known as: TYLENOL Stopped by: Minetta Krisher   benzonatate 200 MG capsule Commonly known as: TESSALON Stopped by: [name]       TAKE these medications    albuterol 108 (90 Base) MCG/ACT inhaler Commonly known as: VENTOLIN HFA Inhale 1 puff into the lungs as needed for wheezing or shortness of breath.   Calcium-Vitamin D 600-400 MG-UNIT Tabs Take 1 tablet by mouth daily.   citalopram 20 MG tablet Commonly known as: CELEXA Take 1 tablet (20 mg total) by mouth at bedtime. DX F41.9   donepezil 10 MG tablet Commonly known as: ARICEPT TAKE 1 TABLET BY MOUTH EVERYDAY AT BEDTIME   Eliquis 5 MG Tabs tablet Generic drug: apixaban TAKE 1 TABLET BY MOUTH TWICE A DAY   ferrous sulfate 325 (65 FE) MG EC tablet TAKE 1 TABLET BY MOUTH EVERY DAY WITH BREAKFAST   fluticasone 50 MCG/ACT nasal spray Commonly known as: FLONASE SPRAY 1 SPRAY INTO EACH NOSTRIL DAILY   guaiFENesin 600 MG 12 hr tablet Commonly known as: MUCINEX Take 1,200 mg by mouth as needed.   ipratropium 0.03 % nasal spray Commonly known as: ATROVENT SPRAY 2 SPRAYS INTO EACH NOSTRIL 3 TIMES A DAY AS NEEDED FOR RHINITIS   levothyroxine 88 MCG tablet Commonly known as: SYNTHROID TAKE 1 TABLET BY MOUTH EVERY DAY IN THE MORNING   losartan 100 MG tablet Commonly known as: COZAAR TAKE 1 TABLET BY MOUTH EVERY DAY   metFORMIN 500 MG tablet Commonly known as: GLUCOPHAGE Take 1 tablet (500 mg total) by mouth daily with supper.   metoprolol tartrate 25 MG tablet Commonly known as:  LOPRESSOR TAKE 1 TABLET BY MOUTH TWICE A DAY   montelukast 10 MG tablet Commonly known as: SINGULAIR TAKE 1 TABLET BY MOUTH EVERYDAY AT BEDTIME   multivitamin with minerals Tabs tablet Take 1 tablet by mouth daily.   pantoprazole 40 MG tablet Commonly known as: PROTONIX TAKE 1 TABLET BY MOUTH EVERY DAY   rOPINIRole 1 MG tablet Commonly known as: REQUIP TAKE 1 TABLET BY MOUTH EVERYDAY AT BEDTIME   rosuvastatin 20 MG tablet Commonly known as: CRESTOR Take 1 tablet (20 mg total) by mouth daily.   tacrolimus 1 MG capsule Commonly known as: PROGRAF Take 1 mg by mouth 2 (two) times daily.   Trelegy Ellipta 100-62.5-25 MCG/ACT Aepb Generic drug: Fluticasone-Umeclidin-Vilant INHALE 1 PUFF BY MOUTH EVERY DAY        Signature:  [name], MD [facility] Pulmonary/Critical Care Pager - [phone] 10/13/2022, 3:38 PM

## 2022-10-22 ENCOUNTER — Telehealth: Payer: Self-pay | Admitting: Pulmonary Disease

## 2022-10-22 ENCOUNTER — Other Ambulatory Visit: Payer: Self-pay | Admitting: Nurse Practitioner

## 2022-10-22 ENCOUNTER — Other Ambulatory Visit: Payer: Self-pay | Admitting: Pulmonary Disease

## 2022-10-22 DIAGNOSIS — E039 Hypothyroidism, unspecified: Secondary | ICD-10-CM

## 2022-10-22 DIAGNOSIS — K219 Gastro-esophageal reflux disease without esophagitis: Secondary | ICD-10-CM

## 2022-10-22 NOTE — Telephone Encounter (Signed)
Medication sent.

## 2022-10-22 NOTE — Telephone Encounter (Signed)
Patient states needs refill for Eliquis. Pharmacy is [facility] [place]. Patient phone number is [phone].

## 2022-10-31 ENCOUNTER — Emergency Department (HOSPITAL_COMMUNITY): Payer: Medicare PPO

## 2022-10-31 ENCOUNTER — Encounter (HOSPITAL_COMMUNITY): Payer: Self-pay

## 2022-10-31 ENCOUNTER — Inpatient Hospital Stay (HOSPITAL_COMMUNITY)
Admission: EM | Admit: 2022-10-31 | Discharge: 2022-11-06 | DRG: 481 | Disposition: A | Discharge status: Skilled Nursing Facility | Payer: Medicare PPO | Attending: Family Medicine | Admitting: Family Medicine

## 2022-10-31 ENCOUNTER — Inpatient Hospital Stay (HOSPITAL_COMMUNITY): Payer: Medicare PPO

## 2022-10-31 ENCOUNTER — Other Ambulatory Visit: Payer: Self-pay

## 2022-10-31 DIAGNOSIS — J4489 Other specified chronic obstructive pulmonary disease: Secondary | ICD-10-CM | POA: Diagnosis not present

## 2022-10-31 DIAGNOSIS — Z86711 Personal history of pulmonary embolism: Secondary | ICD-10-CM

## 2022-10-31 DIAGNOSIS — I129 Hypertensive chronic kidney disease with stage 1 through stage 4 chronic kidney disease, or unspecified chronic kidney disease: Secondary | ICD-10-CM | POA: Diagnosis present

## 2022-10-31 DIAGNOSIS — E871 Hypo-osmolality and hyponatremia: Secondary | ICD-10-CM | POA: Diagnosis not present

## 2022-10-31 DIAGNOSIS — W010XXA Fall on same level from slipping, tripping and stumbling without subsequent striking against object, initial encounter: Secondary | ICD-10-CM | POA: Diagnosis present

## 2022-10-31 DIAGNOSIS — K219 Gastro-esophageal reflux disease without esophagitis: Secondary | ICD-10-CM | POA: Diagnosis present

## 2022-10-31 DIAGNOSIS — S7222XA Displaced subtrochanteric fracture of left femur, initial encounter for closed fracture: Secondary | ICD-10-CM | POA: Diagnosis present

## 2022-10-31 DIAGNOSIS — E039 Hypothyroidism, unspecified: Secondary | ICD-10-CM | POA: Diagnosis present

## 2022-10-31 DIAGNOSIS — E7800 Pure hypercholesterolemia, unspecified: Secondary | ICD-10-CM | POA: Diagnosis present

## 2022-10-31 DIAGNOSIS — R2689 Other abnormalities of gait and mobility: Secondary | ICD-10-CM | POA: Diagnosis not present

## 2022-10-31 DIAGNOSIS — G4733 Obstructive sleep apnea (adult) (pediatric): Secondary | ICD-10-CM | POA: Diagnosis present

## 2022-10-31 DIAGNOSIS — Z6835 Body mass index (BMI) 35.0-35.9, adult: Secondary | ICD-10-CM

## 2022-10-31 DIAGNOSIS — Z01810 Encounter for preprocedural cardiovascular examination: Secondary | ICD-10-CM | POA: Diagnosis not present

## 2022-10-31 DIAGNOSIS — S72142A Displaced intertrochanteric fracture of left femur, initial encounter for closed fracture: Principal | ICD-10-CM

## 2022-10-31 DIAGNOSIS — E669 Obesity, unspecified: Secondary | ICD-10-CM | POA: Diagnosis present

## 2022-10-31 DIAGNOSIS — N183 Chronic kidney disease, stage 3 unspecified: Secondary | ICD-10-CM | POA: Diagnosis not present

## 2022-10-31 DIAGNOSIS — Z8249 Family history of ischemic heart disease and other diseases of the circulatory system: Secondary | ICD-10-CM

## 2022-10-31 DIAGNOSIS — Z79621 Long term (current) use of calcineurin inhibitor: Secondary | ICD-10-CM | POA: Diagnosis not present

## 2022-10-31 DIAGNOSIS — I517 Cardiomegaly: Secondary | ICD-10-CM | POA: Diagnosis not present

## 2022-10-31 DIAGNOSIS — Z7951 Long term (current) use of inhaled steroids: Secondary | ICD-10-CM

## 2022-10-31 DIAGNOSIS — R41841 Cognitive communication deficit: Secondary | ICD-10-CM | POA: Diagnosis not present

## 2022-10-31 DIAGNOSIS — S72009A Fracture of unspecified part of neck of unspecified femur, initial encounter for closed fracture: Secondary | ICD-10-CM | POA: Diagnosis not present

## 2022-10-31 DIAGNOSIS — Z96653 Presence of artificial knee joint, bilateral: Secondary | ICD-10-CM | POA: Diagnosis present

## 2022-10-31 DIAGNOSIS — Z7984 Long term (current) use of oral hypoglycemic drugs: Secondary | ICD-10-CM

## 2022-10-31 DIAGNOSIS — S79919A Unspecified injury of unspecified hip, initial encounter: Secondary | ICD-10-CM | POA: Diagnosis not present

## 2022-10-31 DIAGNOSIS — S72002A Fracture of unspecified part of neck of left femur, initial encounter for closed fracture: Secondary | ICD-10-CM | POA: Diagnosis present

## 2022-10-31 DIAGNOSIS — J449 Chronic obstructive pulmonary disease, unspecified: Secondary | ICD-10-CM | POA: Diagnosis not present

## 2022-10-31 DIAGNOSIS — Y92015 Private garage of single-family (private) house as the place of occurrence of the external cause: Secondary | ICD-10-CM

## 2022-10-31 DIAGNOSIS — Z807 Family history of other malignant neoplasms of lymphoid, hematopoietic and related tissues: Secondary | ICD-10-CM

## 2022-10-31 DIAGNOSIS — R001 Bradycardia, unspecified: Secondary | ICD-10-CM | POA: Diagnosis not present

## 2022-10-31 DIAGNOSIS — M25552 Pain in left hip: Secondary | ICD-10-CM | POA: Diagnosis not present

## 2022-10-31 DIAGNOSIS — Z85828 Personal history of other malignant neoplasm of skin: Secondary | ICD-10-CM

## 2022-10-31 DIAGNOSIS — M6281 Muscle weakness (generalized): Secondary | ICD-10-CM | POA: Diagnosis not present

## 2022-10-31 DIAGNOSIS — I10 Essential (primary) hypertension: Secondary | ICD-10-CM | POA: Diagnosis not present

## 2022-10-31 DIAGNOSIS — Z7901 Long term (current) use of anticoagulants: Secondary | ICD-10-CM

## 2022-10-31 DIAGNOSIS — R0902 Hypoxemia: Secondary | ICD-10-CM | POA: Diagnosis not present

## 2022-10-31 DIAGNOSIS — Z87891 Personal history of nicotine dependence: Secondary | ICD-10-CM

## 2022-10-31 DIAGNOSIS — Z8679 Personal history of other diseases of the circulatory system: Secondary | ICD-10-CM

## 2022-10-31 DIAGNOSIS — Z79899 Other long term (current) drug therapy: Secondary | ICD-10-CM

## 2022-10-31 DIAGNOSIS — N1831 Chronic kidney disease, stage 3a: Secondary | ICD-10-CM | POA: Diagnosis not present

## 2022-10-31 DIAGNOSIS — J9811 Atelectasis: Secondary | ICD-10-CM | POA: Diagnosis not present

## 2022-10-31 DIAGNOSIS — D72829 Elevated white blood cell count, unspecified: Secondary | ICD-10-CM | POA: Diagnosis present

## 2022-10-31 DIAGNOSIS — Z823 Family history of stroke: Secondary | ICD-10-CM

## 2022-10-31 DIAGNOSIS — I4719 Other supraventricular tachycardia: Secondary | ICD-10-CM | POA: Diagnosis present

## 2022-10-31 DIAGNOSIS — N050 Unspecified nephritic syndrome with minor glomerular abnormality: Secondary | ICD-10-CM | POA: Diagnosis present

## 2022-10-31 DIAGNOSIS — Z743 Need for continuous supervision: Secondary | ICD-10-CM | POA: Diagnosis not present

## 2022-10-31 DIAGNOSIS — Z9181 History of falling: Secondary | ICD-10-CM | POA: Diagnosis not present

## 2022-10-31 DIAGNOSIS — S72142D Displaced intertrochanteric fracture of left femur, subsequent encounter for closed fracture with routine healing: Secondary | ICD-10-CM | POA: Diagnosis not present

## 2022-10-31 DIAGNOSIS — I4891 Unspecified atrial fibrillation: Secondary | ICD-10-CM | POA: Diagnosis not present

## 2022-10-31 DIAGNOSIS — D62 Acute posthemorrhagic anemia: Secondary | ICD-10-CM | POA: Diagnosis not present

## 2022-10-31 DIAGNOSIS — R2681 Unsteadiness on feet: Secondary | ICD-10-CM | POA: Diagnosis not present

## 2022-10-31 DIAGNOSIS — Z79890 Hormone replacement therapy (postmenopausal): Secondary | ICD-10-CM

## 2022-10-31 DIAGNOSIS — Z8489 Family history of other specified conditions: Secondary | ICD-10-CM

## 2022-10-31 DIAGNOSIS — E782 Mixed hyperlipidemia: Secondary | ICD-10-CM | POA: Diagnosis not present

## 2022-10-31 DIAGNOSIS — W19XXXA Unspecified fall, initial encounter: Secondary | ICD-10-CM

## 2022-10-31 DIAGNOSIS — Z825 Family history of asthma and other chronic lower respiratory diseases: Secondary | ICD-10-CM

## 2022-10-31 LAB — CBC WITH DIFFERENTIAL/PLATELET
Abs Immature Granulocytes: 0.05 10*3/uL (ref 0.00–0.07)
Basophils Absolute: 0.1 10*3/uL (ref 0.0–0.1)
Basophils Relative: 1 %
Eosinophils Absolute: 0 10*3/uL (ref 0.0–0.5)
Eosinophils Relative: 0 %
HCT: 40.8 % (ref 36.0–46.0)
Hemoglobin: 12.5 g/dL (ref 12.0–15.0)
Immature Granulocytes: 1 %
Lymphocytes Relative: 9 %
Lymphs Abs: 0.9 10*3/uL (ref 0.7–4.0)
MCH: 28.5 pg (ref 26.0–34.0)
MCHC: 30.6 g/dL (ref 30.0–36.0)
MCV: 92.9 fL (ref 80.0–100.0)
Monocytes Absolute: 0.7 10*3/uL (ref 0.1–1.0)
Monocytes Relative: 8 %
Neutro Abs: 7.7 10*3/uL (ref 1.7–7.7)
Neutrophils Relative %: 81 %
Platelets: 243 10*3/uL (ref 150–400)
RBC: 4.39 MIL/uL (ref 3.87–5.11)
RDW: 15.2 % (ref 11.5–15.5)
WBC: 9.4 10*3/uL (ref 4.0–10.5)
nRBC: 0 % (ref 0.0–0.2)

## 2022-10-31 LAB — TYPE AND SCREEN
ABO/RH(D): A POS
Antibody Screen: NEGATIVE

## 2022-10-31 LAB — BASIC METABOLIC PANEL
Anion gap: 6 (ref 5–15)
BUN: 22 mg/dL (ref 8–23)
CO2: 27 mmol/L (ref 22–32)
Calcium: 8.6 mg/dL — ABNORMAL LOW (ref 8.9–10.3)
Chloride: 105 mmol/L (ref 98–111)
Creatinine, Ser: 0.91 mg/dL (ref 0.44–1.00)
GFR, Estimated: >60 mL/min (ref >60)
Glucose, Bld: 122 mg/dL — ABNORMAL HIGH (ref 70–99)
Potassium: 4.1 mmol/L (ref 3.5–5.1)
Sodium: 138 mmol/L (ref 135–145)

## 2022-10-31 LAB — PROTIME-INR
INR: 1.3 — ABNORMAL HIGH (ref 0.8–1.2)
Prothrombin Time: 16 seconds — ABNORMAL HIGH (ref 11.4–15.2)

## 2022-10-31 LAB — GLUCOSE, CAPILLARY: Glucose-Capillary: 117 mg/dL — ABNORMAL HIGH (ref 70–99)

## 2022-10-31 MED ORDER — METOPROLOL TARTRATE 25 MG PO TABS
25.0000 mg | ORAL_TABLET | Freq: Two times a day (BID) | ORAL | Status: DC
  Administered 2022-10-31 – 2022-11-06 (×11): 25 mg via ORAL
  Filled 2022-10-31 (×12): qty 1

## 2022-10-31 MED ORDER — METHOCARBAMOL 1000 MG/10ML IJ SOLN: 500.0000 mg | Freq: Four times a day (QID) | INTRAVENOUS | Status: DC | PRN

## 2022-10-31 MED ORDER — ROPINIROLE HCL 0.5 MG PO TABS
1.0000 mg | ORAL_TABLET | Freq: Every day | ORAL | Status: DC
  Administered 2022-10-31 – 2022-11-05 (×6): 1 mg via ORAL
  Filled 2022-10-31: qty 2
  Filled 2022-10-31: qty 1
  Filled 2022-10-31 (×4): qty 2

## 2022-10-31 MED ORDER — ALBUTEROL SULFATE (2.5 MG/3ML) 0.083% IN NEBU: 2.5000 mg | INHALATION_SOLUTION | RESPIRATORY_TRACT | Status: DC | PRN

## 2022-10-31 MED ORDER — HYDROCODONE-ACETAMINOPHEN 5-325 MG PO TABS
1.0000 | ORAL_TABLET | Freq: Four times a day (QID) | ORAL | Status: DC | PRN
  Administered 2022-10-31 (×2): 2 via ORAL
  Administered 2022-11-01: 1 via ORAL
  Administered 2022-11-01 – 2022-11-05 (×11): 2 via ORAL
  Administered 2022-11-05: 1 via ORAL
  Administered 2022-11-05: 2 via ORAL
  Administered 2022-11-06: 1 via ORAL
  Administered 2022-11-06: 2 via ORAL
  Filled 2022-10-31: qty 1
  Filled 2022-10-31 (×2): qty 2
  Filled 2022-10-31: qty 1
  Filled 2022-10-31 (×9): qty 2
  Filled 2022-10-31: qty 1
  Filled 2022-10-31 (×4): qty 2

## 2022-10-31 MED ORDER — ROSUVASTATIN CALCIUM 20 MG PO TABS
20.0000 mg | ORAL_TABLET | Freq: Every day | ORAL | Status: DC
  Administered 2022-10-31 – 2022-11-05 (×6): 20 mg via ORAL
  Filled 2022-10-31 (×6): qty 1

## 2022-10-31 MED ORDER — FLUTICASONE FUROATE-VILANTEROL 100-25 MCG/ACT IN AEPB
1.0000 | INHALATION_SPRAY | Freq: Every day | RESPIRATORY_TRACT | Status: DC
  Administered 2022-11-01 – 2022-11-06 (×6): 1 via RESPIRATORY_TRACT
  Filled 2022-10-31 (×2): qty 28

## 2022-10-31 MED ORDER — ONDANSETRON HCL 4 MG/2ML IJ SOLN
4.0000 mg | Freq: Once | INTRAMUSCULAR | Status: AC | PRN
  Administered 2022-10-31: 4 mg via INTRAVENOUS
  Filled 2022-10-31: qty 2

## 2022-10-31 MED ORDER — PANTOPRAZOLE SODIUM 40 MG PO TBEC
40.0000 mg | DELAYED_RELEASE_TABLET | Freq: Every day | ORAL | Status: DC
  Administered 2022-11-01 – 2022-11-06 (×6): 40 mg via ORAL
  Filled 2022-10-31 (×6): qty 1

## 2022-10-31 MED ORDER — LOSARTAN POTASSIUM 50 MG PO TABS
100.0000 mg | ORAL_TABLET | Freq: Every day | ORAL | Status: DC
  Administered 2022-11-01 – 2022-11-04 (×3): 100 mg via ORAL
  Filled 2022-10-31 (×4): qty 2

## 2022-10-31 MED ORDER — UMECLIDINIUM BROMIDE 62.5 MCG/ACT IN AEPB
1.0000 | INHALATION_SPRAY | Freq: Every day | RESPIRATORY_TRACT | Status: DC
  Administered 2022-11-01 – 2022-11-06 (×6): 1 via RESPIRATORY_TRACT
  Filled 2022-10-31: qty 7

## 2022-10-31 MED ORDER — LEVOTHYROXINE SODIUM 88 MCG PO TABS
88.0000 ug | ORAL_TABLET | Freq: Every day | ORAL | Status: DC
  Administered 2022-11-01 – 2022-11-06 (×6): 88 ug via ORAL
  Filled 2022-10-31 (×6): qty 1

## 2022-10-31 MED ORDER — CITALOPRAM HYDROBROMIDE 20 MG PO TABS
20.0000 mg | ORAL_TABLET | Freq: Every day | ORAL | Status: DC
  Administered 2022-10-31 – 2022-11-05 (×6): 20 mg via ORAL
  Filled 2022-10-31 (×3): qty 1
  Filled 2022-10-31: qty 2
  Filled 2022-10-31 (×2): qty 1

## 2022-10-31 MED ORDER — HYDROMORPHONE HCL 1 MG/ML IJ SOLN
0.5000 mg | INTRAMUSCULAR | Status: AC | PRN
  Administered 2022-10-31 (×2): 0.5 mg via INTRAVENOUS
  Filled 2022-10-31 (×2): qty 1

## 2022-10-31 MED ORDER — METHOCARBAMOL 500 MG PO TABS
500.0000 mg | ORAL_TABLET | Freq: Four times a day (QID) | ORAL | Status: DC | PRN
  Administered 2022-10-31 – 2022-11-02 (×5): 500 mg via ORAL
  Filled 2022-10-31 (×5): qty 1

## 2022-10-31 MED ORDER — MORPHINE SULFATE (PF) 2 MG/ML IV SOLN
0.5000 mg | INTRAVENOUS | Status: DC | PRN
  Administered 2022-10-31 – 2022-11-02 (×6): 0.5 mg via INTRAVENOUS
  Filled 2022-10-31 (×6): qty 1

## 2022-10-31 NOTE — ED Notes (Signed)
ED TO INPATIENT HANDOFF REPORT  ED Nurse Name and Phone #: Julian Reil [phone]  S Name/Age/Gender [name] 82 y.o. female Room/Bed: WA15/WA15  Code Status   Code Status: Full Code  Home/SNF/Other Home Patient oriented to: self, place, time, and situation Is this baseline? Yes   Triage Complete: Triage complete  Chief Complaint Closed left hip fracture, initial encounter [facility]) [S72.002A]  Triage Note Pt reports reaching for something on the wall in the garage, turned and got caught and fell. Pt reports left hip pain after falling. Patient denies any LOC or hitting head. No other complaints. Pt does take eliquis. EMS gave 200 mcg fentanyl.    Allergies No Known Allergies  Level of Care/Admitting Diagnosis ED Disposition     ED Disposition  Admit   Condition  --   Comment  Hospital Area: [facility] [[id number]]  Level of Care: Telemetry Medical [104]  May admit patient to [facility] or [facility] if equivalent level of care is available:: No  Covid Evaluation: Asymptomatic - no recent exposure (last 10 days) testing not required  Diagnosis: Closed left hip fracture, initial encounter [facility]) [[id number]]  Admitting Physician: Andris Baumann [[id number]]  Attending Physician: Andris Baumann [[id number]]  Certification:: I certify this patient will need inpatient services for at least 2 midnights  Expected Medical Readiness: 11/05/2022          B Medical/Surgery History Past Medical History:  Diagnosis Date   Actinic keratosis    Arthritis    Asthma    Back injury 2019   Fracture   Basal cell carcinoma    COPD (chronic obstructive pulmonary disease) (HCC)    GERD (gastroesophageal reflux disease)    H/O blood clots 2020   Lung, Per [facility] New Patient Packet   H/O mammogram 2022   Per [facility] New Patient Packet   Heart murmur    hx of in childhood    High cholesterol    Hyperlipidemia    Hypertension    Hypothyroidism    Kidney disease     Per [facility] Packet   MCNS (minimal change nephrotic syndrome)    OSA (obstructive sleep apnea) 08/29/2019   Osteopenia    Shortness of breath dyspnea    on exertion    Squamous cell carcinoma    Thyroid disease    Per [facility] New Patient Packet   Past Surgical History:  Procedure Laterality Date   ABDOMINAL HYSTERECTOMY     APPENDECTOMY     BILATERAL SALPINGOOPHORECTOMY     Ovarian Cysts    CHOLECYSTECTOMY N/A 06/17/2022   Procedure: LAPAROSCOPIC CHOLECYSTECTOMY;  Surgeon: Fritzi Mandes, MD;  Location: [facility];  Service: General;  Laterality: N/A;   COLONOSCOPY  2001   Dr.Mann, Per [facility] New Patient Packet   CONVERSION TO TOTAL KNEE Right 07/10/2014   Procedure: RIGHT CONVERSION OF PARTIAL KNEE TO TOTAL KNEE;  Surgeon: [name], MD;  Location: [facility];  Service: Orthopedics;  Laterality: Right;   ENDOSCOPIC RETROGRADE CHOLANGIOPANCREATOGRAPHY (ERCP) WITH PROPOFOL N/A 06/15/2022   Procedure: ENDOSCOPIC RETROGRADE CHOLANGIOPANCREATOGRAPHY (ERCP) WITH PROPOFOL;  Surgeon: Vida Rigger, MD;  Location: [facility];  Service: Gastroenterology;  Laterality: N/A;   IR ANGIOGRAM PULMONARY BILATERAL SELECTIVE  02/08/2019   IR ANGIOGRAM SELECTIVE EACH ADDITIONAL VESSEL  02/08/2019   IR ANGIOGRAM SELECTIVE EACH ADDITIONAL VESSEL  02/08/2019   IR INFUSION THROMBOL ARTERIAL INITIAL (MS)  02/08/2019   IR INFUSION THROMBOL ARTERIAL INITIAL (MS)  02/08/2019  IR THROMB F/U EVAL ART/VEN FINAL DAY (MS)  02/09/2019   IR US GUIDE VASC ACCESS RIGHT  02/08/2019   MEDIAL PARTIAL KNEE REPLACEMENT Bilateral    MOHS SURGERY     x 2   REMOVAL OF STONES  06/15/2022   Procedure: REMOVAL OF STONES;  Surgeon: Vida Rigger, MD;  Location: [facility];  Service: Gastroenterology;;   RENAL BIOPSY     x 2   REPLACEMENT TOTAL KNEE  2012   Per [facility] New Patient Packet   ROTATOR CUFF REPAIR Left 2008   SPHINCTEROTOMY  06/15/2022   Procedure: SPHINCTEROTOMY;  Surgeon: Vida Rigger, MD;  Location: [facility];   Service: Gastroenterology;;     A IV Location/Drains/Wounds Patient Lines/Drains/Airways Status     Active Line/Drains/Airways     Name Placement date Placement time Site Days   Peripheral IV 10/31/22 20 G Left Antecubital 10/31/22  1455  Antecubital  less than 1   External Urinary Catheter 10/31/22  1628  --  less than 1   Incision - 4 Ports Abdomen 1: Right;Lateral;Lower 2: Right;Lateral;Mid 3: Medial;Upper 4: Umbilicus 06/17/22  --  -- 136            Intake/Output Last 24 hours No intake or output data in the 24 hours ending 10/31/22 1941  Labs/Imaging Results for orders placed or performed during the hospital encounter of 10/31/22 (from the past 48 hour(s))  CBC with Differential     Status: None   Collection Time: 10/31/22  3:06 PM  Result Value Ref Range   WBC 9.4 4.0 - 10.5 K/uL   RBC 4.39 3.87 - 5.11 MIL/uL   Hemoglobin 12.5 12.0 - 15.0 g/dL   HCT 91.4 78.2 - 95.6 %   MCV 92.9 80.0 - 100.0 fL   MCH 28.5 26.0 - 34.0 pg   MCHC 30.6 30.0 - 36.0 g/dL   RDW 21.3 08.6 - 57.8 %   Platelets 243 150 - 400 K/uL   nRBC 0.0 0.0 - 0.2 %   Neutrophils Relative % 81 %   Neutro Abs 7.7 1.7 - 7.7 K/uL   Lymphocytes Relative 9 %   Lymphs Abs 0.9 0.7 - 4.0 K/uL   Monocytes Relative 8 %   Monocytes Absolute 0.7 0.1 - 1.0 K/uL   Eosinophils Relative 0 %   Eosinophils Absolute 0.0 0.0 - 0.5 K/uL   Basophils Relative 1 %   Basophils Absolute 0.1 0.0 - 0.1 K/uL   Immature Granulocytes 1 %   Abs Immature Granulocytes 0.05 0.00 - 0.07 K/uL    Comment: Performed at [facility], [address] [place], [place] [address]  Protime-INR     Status: Abnormal   Collection Time: 10/31/22  3:06 PM  Result Value Ref Range   Prothrombin Time 16.0 (H) 11.4 - 15.2 seconds   INR 1.3 (H) 0.8 - 1.2    Comment: (NOTE) INR goal varies based on device and disease states. Performed at [facility], [address] [place], [place] [address]   Type and  screen [facility]     Status: None   Collection Time: 10/31/22  3:06 PM  Result Value Ref Range   ABO/RH(D) A POS    Antibody Screen NEG    Sample Expiration      11/03/2022,2359 Performed at [facility], [address] [place], [place] [address]   Basic metabolic panel     Status: Abnormal   Collection Time: 10/31/22  4:15 PM  Result Value Ref Range   Sodium 138 135 - 145 mmol/L   Potassium 4.1 3.5 - 5.1 mmol/L   Chloride 105 98 - 111 mmol/L   CO2 27 22 - 32 mmol/L   Glucose, Bld 122 (H) 70 - 99 mg/dL    Comment: Glucose reference range applies only to samples taken after fasting for at least 8 hours.   BUN 22 8 - 23 mg/dL   Creatinine, Ser 2.35 0.44 - 1.00 mg/dL   Calcium 8.6 (L) 8.9 - 10.3 mg/dL   GFR, Estimated >57 >32 mL/min    Comment: (NOTE) Calculated using the CKD-EPI Creatinine Equation (2021)    Anion gap 6 5 - 15    Comment: Performed at [facility], [address], [place], [place] [address]   Chest Portable 1 View  Result Date: 10/31/2022 CLINICAL DATA:  Preop hip fracture EXAM: PORTABLE CHEST 1 VIEW COMPARISON:  Chest x-ray 06/19/2022 FINDINGS: The heart is mildly enlarged, unchanged. The lungs are clear. There is no pleural effusion or pneumothorax. No acute fractures are seen. IMPRESSION: 1. No active disease. 2. Mild cardiomegaly. Electronically Signed   By: Darliss Cheney M.D.   On: 10/31/2022 19:07   DG Hip Unilat With Pelvis 2-3 Views Left  Result Date: 10/31/2022 CLINICAL DATA:  Left hip injury, pain EXAM: DG HIP (WITH OR WITHOUT PELVIS) 2-3V LEFT COMPARISON:  06/13/2022 FINDINGS: Acute, comminuted intertrochanteric fracture of the proximal left femur with varus angulation. The lateral trochanteric fracture fragment is mildly displaced medially. Hip joint alignment is maintained without dislocation. Bony pelvis intact without fracture or diastasis. Soft tissue swelling at the fracture site. IMPRESSION:  Acute, comminuted intertrochanteric fracture of the proximal left femur. Electronically Signed   By: Duanne Guess D.O.   On: 10/31/2022 16:04    Pending Labs Unresulted Labs (From admission, onward)     Start     Ordered   10/31/22 1847  Magnesium  Add-on,   AD        10/31/22 1846            Vitals/Pain Today's Vitals   10/31/22 1730 10/31/22 1800 10/31/22 1845 10/31/22 1848  BP: (!) 152/100 (!) 153/67 (!) 151/74   Pulse: (!) 59 64 64   Resp: (!) 9 11 (!) 21   Temp:    98.3 F (36.8 C)  TempSrc:    Oral  SpO2: 100% 94% 100%   Weight:      Height:      PainSc:        Isolation Precautions No active isolations  Medications Medications  losartan (COZAAR) tablet 100 mg (has no administration in time range)  metoprolol tartrate (LOPRESSOR) tablet 25 mg (has no administration in time range)  rosuvastatin (CRESTOR) tablet 20 mg (has no administration in time range)  citalopram (CELEXA) tablet 20 mg (has no administration in time range)  levothyroxine (SYNTHROID) tablet 88 mcg (has no administration in time range)  pantoprazole (PROTONIX) EC tablet 40 mg (has no administration in time range)  rOPINIRole (REQUIP) tablet 1 mg (has no administration in time range)  albuterol (PROVENTIL) (2.5 MG/3ML) 0.083% nebulizer solution 2.5 mg (has no administration in time range)  fluticasone furoate-vilanterol (BREO ELLIPTA) 100-25 MCG/ACT 1 puff (1 puff Inhalation Not Given 10/31/22 1849)    And  umeclidinium bromide (INCRUSE ELLIPTA) 62.5 MCG/ACT 1 puff (1 puff Inhalation Not Given 10/31/22 1849)  HYDROcodone-acetaminophen (NORCO/VICODIN) 5-325 MG per tablet 1-2 tablet (2 tablets Oral Given  10/31/22 1843)  morphine (PF) 2 MG/ML injection 0.5 mg (has no administration in time range)  methocarbamol (ROBAXIN) tablet 500 mg (500 mg Oral Given 10/31/22 1843)    Or  methocarbamol (ROBAXIN) 500 mg in dextrose 5 % 50 mL IVPB ( Intravenous See Alternative 10/31/22 1843)  HYDROmorphone  (DILAUDID) injection 0.5 mg (0.5 mg Intravenous Given 10/31/22 1609)  ondansetron (ZOFRAN) injection 4 mg (4 mg Intravenous Given 10/31/22 1505)    Mobility non-ambulatory     Focused Assessments    R Recommendations: See Admitting Provider Note  Report given to:   Additional Notes:

## 2022-10-31 NOTE — Assessment & Plan Note (Signed)
Not acutely exacerbated Continue home Trelegy with DuoNebs as needed

## 2022-10-31 NOTE — Assessment & Plan Note (Addendum)
Accidental fall Patient had no preceding symptoms Hold Eliquis Continue diet as surgery will be anticipated on 9/23 Patient placed in Buck's traction from the ED per request of orthopedist For admission to [facility] per request of Dr. Roda Shutters

## 2022-10-31 NOTE — H&P (Signed)
History and Physical    Patient: [name] ZOX:[id number] DOB: 23-Feb-1940 DOA: 10/31/2022 DOS: the patient was seen and examined on 10/31/2022 PCP: [name], NP  Patient coming from: Home  Chief Complaint:  Chief Complaint  Patient presents with   Fall    HPI: [name] is a 82 y.o. 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  She denied preceding palpitations, lightheadedness, visual disturbance, shortness of breath, chest pain, one-sided weakness numbness or tingling.  She did not hit her head, had no loss of consciousness.  She complains of pain in the left hip and numbness in the leg. ED course and data review: BP 179/85 on arrival mildly bradycardic to 54 Labs including CBC with differential, BMP and INR unremarkable. EKG, Personally viewed and interpreted showing sinus bradycardia at 56 with nonspecific ST-T wave changes. X-ray hip shows acute comminuted intertrochanteric fracture of the proximal left femur. The ED provider spoke with orthopedist Dr. Roda Shutters who would like patient in Buck's traction and admitted to [facility] for surgery on Monday Hospitalist consulted for admission.   Review of Systems: As mentioned in the history of present illness. All other systems reviewed and are negative.  Past Medical History:  Diagnosis Date   Actinic keratosis    Arthritis    Asthma    Back injury 2019   Fracture   Basal cell carcinoma    COPD  (chronic obstructive pulmonary disease) (HCC)    GERD (gastroesophageal reflux disease)    H/O blood clots 2020   Lung, Per [facility] New Patient Packet   H/O mammogram 2022   Per [facility] New Patient Packet   Heart murmur    hx of in childhood    High cholesterol    Hyperlipidemia    Hypertension    Hypothyroidism    Kidney disease    Per [facility] New Patient Packet   MCNS (minimal change nephrotic syndrome)    OSA (obstructive sleep apnea) 08/29/2019   Osteopenia    Shortness of breath dyspnea    on exertion    Squamous cell carcinoma    Thyroid disease    Per [facility] New Patient Packet   Past Surgical History:  Procedure Laterality Date   ABDOMINAL HYSTERECTOMY     APPENDECTOMY     BILATERAL SALPINGOOPHORECTOMY     Ovarian Cysts    CHOLECYSTECTOMY N/A 06/17/2022   Procedure: LAPAROSCOPIC CHOLECYSTECTOMY;  Surgeon: Fritzi Mandes, MD;  Location: [facility];  Service: General;  Laterality: N/A;   COLONOSCOPY  2001   Dr.Mann, Per [facility] New Patient Packet   CONVERSION TO TOTAL KNEE Right 07/10/2014   Procedure: RIGHT CONVERSION OF PARTIAL KNEE TO TOTAL KNEE;  Surgeon: [name], MD;  Location: [facility];  Service: Orthopedics;  Laterality: Right;   ENDOSCOPIC RETROGRADE CHOLANGIOPANCREATOGRAPHY (ERCP) WITH PROPOFOL N/A 06/15/2022   Procedure: ENDOSCOPIC RETROGRADE CHOLANGIOPANCREATOGRAPHY (ERCP) WITH PROPOFOL;  Surgeon: Vida Rigger, MD;  Location: [facility];  Service: Gastroenterology;  Laterality: N/A;   IR ANGIOGRAM PULMONARY BILATERAL SELECTIVE  02/08/2019  IR ANGIOGRAM SELECTIVE EACH ADDITIONAL VESSEL  02/08/2019   IR ANGIOGRAM SELECTIVE EACH ADDITIONAL VESSEL  02/08/2019   IR INFUSION THROMBOL ARTERIAL INITIAL (MS)  02/08/2019   IR INFUSION THROMBOL ARTERIAL INITIAL (MS)  02/08/2019   IR THROMB F/U EVAL ART/VEN FINAL DAY (MS)  02/09/2019   IR US GUIDE VASC ACCESS RIGHT  02/08/2019   MEDIAL PARTIAL KNEE REPLACEMENT Bilateral    MOHS SURGERY     x 2   REMOVAL OF STONES  06/15/2022   Procedure:  REMOVAL OF STONES;  Surgeon: Vida Rigger, MD;  Location: [facility];  Service: Gastroenterology;;   RENAL BIOPSY     x 2   REPLACEMENT TOTAL KNEE  2012   Per [facility] New Patient Packet   ROTATOR CUFF REPAIR Left 2008   SPHINCTEROTOMY  06/15/2022   Procedure: SPHINCTEROTOMY;  Surgeon: Vida Rigger, MD;  Location: [facility];  Service: Gastroenterology;;   Social History:  reports that she quit smoking about 49 years ago. Her smoking use included cigarettes. She started smoking about 74 years ago. She has a 37.5 pack-year smoking history. She has never used smokeless tobacco. She reports current alcohol use of about 0.5 standard drinks of alcohol per week. She reports that she does not use drugs.  No Known Allergies  Family History  Problem Relation Age of Onset   COPD Mother        Husband Smoked    Lymphoma Father    Hypertension Sister    Scoliosis Daughter    Stroke Maternal Grandfather    Rheumatic fever Paternal Grandmother    Heart disease Paternal Grandfather     Prior to Admission medications   Medication Sig Start Date End Date Taking? Authorizing Provider  albuterol (VENTOLIN HFA) 108 (90 Base) MCG/ACT inhaler Inhale 1 puff into the lungs as needed for wheezing or shortness of breath.    [provider]  Calcium Carb-Cholecalciferol (CALCIUM-VITAMIN D) 600-400 MG-UNIT TABS Take 1 tablet by mouth daily.    [provider]  citalopram (CELEXA) 20 MG tablet Take 1 tablet (20 mg total) by mouth at bedtime. DX F41.9 08/10/22   [name], NP  donepezil (ARICEPT) 10 MG tablet TAKE 1 TABLET BY MOUTH EVERYDAY AT BEDTIME 08/10/22   Eubanks, Janene Harvey, NP  ELIQUIS 5 MG TABS tablet TAKE 1 TABLET BY MOUTH TWICE A DAY 10/22/22   [name], MD  ferrous sulfate 325 (65 FE) MG EC tablet TAKE 1 TABLET BY MOUTH EVERY DAY WITH BREAKFAST 07/28/22   [name], NP  fluticasone (FLONASE) 50 MCG/ACT nasal spray SPRAY 1 SPRAY INTO EACH NOSTRIL DAILY 09/14/22   [name], MD  guaiFENesin (MUCINEX) 600 MG 12 hr tablet Take 1,200 mg by mouth as needed.    [provider]  ipratropium (ATROVENT) 0.03 % nasal spray SPRAY 2 SPRAYS INTO EACH NOSTRIL 3 TIMES A DAY AS NEEDED FOR RHINITIS 09/14/22   [name], MD  levothyroxine (SYNTHROID) 88 MCG tablet TAKE 1 TABLET BY MOUTH EVERY DAY IN THE MORNING 10/22/22   [name], NP  losartan (COZAAR) 100 MG tablet TAKE 1 TABLET BY MOUTH EVERY DAY 10/22/22   [name], NP  metFORMIN (GLUCOPHAGE) 500 MG tablet Take 1 tablet (500 mg total) by mouth daily with supper. 04/10/22   [name], NP  metoprolol tartrate (LOPRESSOR) 25 MG tablet TAKE 1 TABLET BY MOUTH TWICE A DAY 09/14/22   [name], NP  montelukast (SINGULAIR) 10 MG  tablet TAKE 1 TABLET BY MOUTH EVERYDAY AT BEDTIME 03/12/22   [name], MD  Multiple Vitamin (MULTIVITAMIN WITH MINERALS) TABS tablet Take 1 tablet by mouth daily.    [provider]  pantoprazole (PROTONIX) 40 MG tablet TAKE 1 TABLET BY MOUTH EVERY DAY 07/13/22   [name], NP  rOPINIRole (REQUIP) 1 MG tablet TAKE 1 TABLET BY MOUTH EVERYDAY AT BEDTIME 06/22/22   [name], NP  rosuvastatin (CRESTOR) 20 MG tablet Take 1 tablet (20 mg total) by mouth daily. 08/18/22 11/16/22  [name], [name], MD  tacrolimus (PROGRAF) 1 MG capsule Take 1 mg by mouth 2 (two) times daily.  01/03/19   [provider]  Dwyane Luo 100-62.5-25 MCG/ACT AEPB INHALE 1 PUFF BY MOUTH EVERY DAY 08/25/22   [name], MD    Physical Exam: Vitals:   10/31/22 1415 10/31/22 1700  BP: (!) 179/85 (!) 157/83  Pulse: (!) 54 62  Resp: 13 11  Temp: 98.3 F (36.8 C)   SpO2: 93% 98%  Weight: 97.5 kg   Height: 5\' 5"  (1.651 m)    Physical Exam Vitals and nursing note reviewed.  Constitutional:      General: She is not in acute distress. HENT:     Head: Normocephalic and atraumatic.  Cardiovascular:     Rate and Rhythm: Normal rate and regular rhythm.      Heart sounds: Normal heart sounds.  Pulmonary:     Effort: Pulmonary effort is normal.     Breath sounds: Normal breath sounds.  Abdominal:     Palpations: Abdomen is soft.     Tenderness: There is no abdominal tenderness.  Neurological:     Mental Status: Mental status is at baseline.     Labs on Admission: I have personally reviewed following labs and imaging studies  CBC: Recent Labs  Lab 10/31/22 1506  WBC 9.4  NEUTROABS 7.7  HGB 12.5  HCT 40.8  MCV 92.9  PLT 243   Basic Metabolic Panel: Recent Labs  Lab 10/31/22 1615  NA 138  K 4.1  CL 105  CO2 27  GLUCOSE 122*  BUN 22  CREATININE 0.91  CALCIUM 8.6*   GFR: Estimated Creatinine Clearance: 55.1 mL/min (by C-G formula based on SCr of 0.91 mg/dL). Liver Function Tests: No results for input(s): "AST", "ALT", "ALKPHOS", "BILITOT", "PROT", "ALBUMIN" in the last 168 hours. No results for input(s): "LIPASE", "AMYLASE" in the last 168 hours. No results for input(s): "AMMONIA" in the last 168 hours. Coagulation Profile: Recent Labs  Lab 10/31/22 1506  INR 1.3*   Cardiac Enzymes: No results for input(s): "CKTOTAL", "CKMB", "CKMBINDEX", "TROPONINI" in the last 168 hours. BNP (last 3 results) No results for input(s): "PROBNP" in the last 8760 hours. HbA1C: No results for input(s): "HGBA1C" in the last 72 hours. CBG: No results for input(s): "GLUCAP" in the last 168 hours. Lipid Profile: No results for input(s): "CHOL", "HDL", "LDLCALC", "TRIG", "CHOLHDL", "LDLDIRECT" in the last 72 hours. Thyroid Function Tests: No results for input(s): "TSH", "T4TOTAL", "FREET4", "T3FREE", "THYROIDAB" in the last 72 hours. Anemia Panel: No results for input(s): "VITAMINB12", "FOLATE", "FERRITIN", "TIBC", "IRON", "RETICCTPCT" in the last 72 hours. Urine analysis:    Component Value Date/Time   COLORURINE YELLOW 06/13/2022 0750   APPEARANCEUR CLEAR 06/13/2022 0750   LABSPEC >1.046 (H) 06/13/2022 0750   PHURINE 5.5  06/13/2022 0750   GLUCOSEU NEGATIVE 06/13/2022 0750   HGBUR NEGATIVE 06/13/2022 0750   BILIRUBINUR NEGATIVE 06/13/2022 0750  BILIRUBINUR Neg 01/12/2013 1604   KETONESUR NEGATIVE 06/13/2022 0750   PROTEINUR TRACE (A) 06/13/2022 0750   UROBILINOGEN 0.2 07/04/2014 1317   NITRITE NEGATIVE 06/13/2022 0750   LEUKOCYTESUR NEGATIVE 06/13/2022 0750    Radiological Exams on Admission: DG Hip Unilat With Pelvis 2-3 Views Left  Result Date: 10/31/2022 CLINICAL DATA:  Left hip injury, pain EXAM: DG HIP (WITH OR WITHOUT PELVIS) 2-3V LEFT COMPARISON:  06/13/2022 FINDINGS: Acute, comminuted intertrochanteric fracture of the proximal left femur with varus angulation. The lateral trochanteric fracture fragment is mildly displaced medially. Hip joint alignment is maintained without dislocation. Bony pelvis intact without fracture or diastasis. Soft tissue swelling at the fracture site. IMPRESSION: Acute, comminuted intertrochanteric fracture of the proximal left femur. Electronically Signed   By: Duanne Guess D.O.   On: 10/31/2022 16:04     Data Reviewed: Relevant notes from primary care and specialist visits, past discharge summaries as available in EHR, including Care Everywhere. Prior diagnostic testing as pertinent to current admission diagnoses Updated medications and problem lists for reconciliation ED course, including vitals, labs, imaging, treatment and response to treatment Triage notes, nursing and pharmacy notes and ED provider's notes Notable results as noted in HPI   Assessment and Plan: * Closed comminuted intertrochanteric fracture of proximal femur, left, initial encounter (HCC) Accidental fall Patient had no preceding symptoms Hold Eliquis Continue diet as surgery will be anticipated on 9/23 Patient placed in Buck's traction from the ED per request of orthopedist For admission to [facility] per request of Dr. Roda Shutters  Chronic anticoagulation History of pulmonary embolism Hold  Eliquis in anticipation of hip repair 9/23  History of PAT (paroxysmal atrial tachycardia) 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 07/2022 Patient was last seen by her cardiologist 08/18/2022 following inpatient PAT following gallbladder surgery in May which was complicated by postop hypoxia Echo in 06/2022 showed G1 DD with EF 50 to 55% Holter monitor 07/2022 showed "1 run of wide-complex tachycardia (115 bpm) lasting 22 seconds (could VT or SVT with aberrancy) - also, numerous PSVT runs - over 1500 runs were recorded, lasting up to 38 seconds again with an average rate of 116 bpm" Will admit to medical telemetry for heart rhythm monitoring Can consider cardiology consult for preoperative clearance Patient currently mildly bradycardic Will continue metoprolol and losartan and keep on telemetry  Essential hypertension, benign Continue metoprolol and losartan  Hypothyroidism Continue levothyroxine  COPD (chronic obstructive pulmonary disease) (HCC) Not acutely exacerbated Continue home Trelegy with DuoNebs as needed  OSA (obstructive sleep apnea) Patient intolerant of CPAP  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 Inpatient status is judged to be reasonable and necessary in order to provide the required intensity of service to ensure the patient's safety. The patient's presenting symptoms, physical exam findings, and initial radiographic and laboratory data in the context of their chronic comorbidities is felt to place them at high risk for further clinical deterioration. Furthermore, it is not  anticipated that the patient will be medically stable for discharge from the hospital within 2 midnights of admission.   * I certify that at the point of admission it is  my clinical judgment that the patient will require inpatient hospital care spanning beyond 2 midnights from the point of admission due to high intensity of service, high risk for further deterioration and high frequency of surveillance required.*  Author: Andris Baumann, MD 10/31/2022 5:46 PM  For on call review [email or web address].

## 2022-10-31 NOTE — Assessment & Plan Note (Addendum)
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 07/2022 Patient was last seen by her cardiologist 08/18/2022 following inpatient PAT following gallbladder surgery in May which was complicated by postop hypoxia Echo in 06/2022 showed G1 DD with EF 50 to 55% Holter monitor 07/2022 showed "1 run of wide-complex tachycardia (115 bpm) lasting 22 seconds (could VT or SVT with aberrancy) - also, numerous PSVT runs - over 1500 runs were recorded, lasting up to 38 seconds again with an average rate of 116 bpm" Will admit to medical telemetry for heart rhythm monitoring Can consider cardiology consult for preoperative clearance Patient currently mildly bradycardic Will continue metoprolol and losartan and keep on telemetry

## 2022-10-31 NOTE — Assessment & Plan Note (Signed)
Continue metoprolol and losartan

## 2022-10-31 NOTE — Assessment & Plan Note (Addendum)
Patient is on tacrolimus 1 mg nightly Will continue Can consider nephrology consult regarding need to discontinue perioperatively

## 2022-10-31 NOTE — Assessment & Plan Note (Addendum)
History of pulmonary embolism Hold Eliquis in anticipation of hip repair 9/23

## 2022-10-31 NOTE — ED Triage Notes (Signed)
Pt reports reaching for something on the wall in the garage, turned and got caught and fell. Pt reports left hip pain after falling. Patient denies any LOC or hitting head. No other complaints. Pt does take eliquis. EMS gave 200 mcg fentanyl.

## 2022-10-31 NOTE — ED Notes (Signed)
ED TO INPATIENT HANDOFF REPORT  Name/Age/Gender [name] 82 y.o. female  Code Status    Code Status Orders  (From admission, onward)           Start     Ordered   10/31/22 1759  Full code  Continuous       Question:  By:  Answer:  Consent: discussion documented in EHR   10/31/22 1800           Code Status History     Date Active Date Inactive Code Status Order ID Comments User Context   06/13/2022 1844 06/21/2022 2057 Full Code [id number]  Rometta Emery, MD Inpatient   02/07/2019 1729 02/13/2019 2304 Full Code [id number]  Yvette Rack, MD ED   05/08/2017 2134 05/12/2017 2044 Full Code [id number]  Clydie Braun, MD ED   07/10/2014 1553 07/12/2014 1631 Full Code [id number]  Shelly Coss Inpatient       Home/SNF/Other Home  Chief Complaint Closed left hip fracture, initial encounter (HCC) [S72.002A]  Level of Care/Admitting Diagnosis ED Disposition     ED Disposition  Admit   Condition  --   Comment  Hospital [facility] [[id number]]  Level of Care: Telemetry Medical [104]  May admit patient to [facility] or [facility] if equivalent level of care is available:: No  Covid Evaluation: Asymptomatic - no recent exposure (last 10 days) testing not required  Diagnosis: Closed left hip fracture, initial encounter [facility]) [[id number]]  Admitting Physician: Andris Baumann [[id number]]  Attending Physician: Andris Baumann [[id number]]  Certification:: I certify this patient will need inpatient services for at least 2 midnights  Expected Medical Readiness: 11/05/2022          Medical History Past Medical History:  Diagnosis Date   Actinic keratosis    Arthritis    Asthma    Back injury 2019   Fracture   Basal cell carcinoma    COPD (chronic obstructive pulmonary disease) (HCC)    GERD (gastroesophageal reflux disease)    H/O blood clots 2020   Lung, Per [facility] New Patient Packet   H/O mammogram 2022   Per [facility] New Patient Packet    Heart murmur    hx of in childhood    High cholesterol    Hyperlipidemia    Hypertension    Hypothyroidism    Kidney disease    Per [facility] New Patient Packet   MCNS (minimal change nephrotic syndrome)    OSA (obstructive sleep apnea) 08/29/2019   Osteopenia    Shortness of breath dyspnea    on exertion    Squamous cell carcinoma    Thyroid disease    Per [facility] New Patient Packet    Allergies No Known Allergies  IV Location/Drains/Wounds Patient Lines/Drains/Airways Status     Active Line/Drains/Airways     Name Placement date Placement time Site Days   Peripheral IV 10/31/22 20 G Left Antecubital 10/31/22  1455  Antecubital  less than 1   External Urinary Catheter 10/31/22  1628  --  less than 1   Incision - 4 Ports Abdomen 1: Right;Lateral;Lower 2: Right;Lateral;Mid 3: Medial;Upper 4: Umbilicus 06/17/22  --  -- 136            Labs/Imaging Results for orders placed or performed during the hospital encounter of 10/31/22 (from the past 48 hour(s))  CBC with Differential     Status: None   Collection Time: 10/31/22  3:06 PM  Result Value Ref Range   WBC 9.4 4.0 - 10.5 K/uL   RBC 4.39 3.87 - 5.11 MIL/uL   Hemoglobin 12.5 12.0 - 15.0 g/dL   HCT 96.0 45.4 - 09.8 %   MCV 92.9 80.0 - 100.0 fL   MCH 28.5 26.0 - 34.0 pg   MCHC 30.6 30.0 - 36.0 g/dL   RDW 11.9 14.7 - 82.9 %   Platelets 243 150 - 400 K/uL   nRBC 0.0 0.0 - 0.2 %   Neutrophils Relative % 81 %   Neutro Abs 7.7 1.7 - 7.7 K/uL   Lymphocytes Relative 9 %   Lymphs Abs 0.9 0.7 - 4.0 K/uL   Monocytes Relative 8 %   Monocytes Absolute 0.7 0.1 - 1.0 K/uL   Eosinophils Relative 0 %   Eosinophils Absolute 0.0 0.0 - 0.5 K/uL   Basophils Relative 1 %   Basophils Absolute 0.1 0.0 - 0.1 K/uL   Immature Granulocytes 1 %   Abs Immature Granulocytes 0.05 0.00 - 0.07 K/uL    Comment: Performed at [facility], [address] [place], [place] [address]  Protime-INR     Status: Abnormal   Collection  Time: 10/31/22  3:06 PM  Result Value Ref Range   Prothrombin Time 16.0 (H) 11.4 - 15.2 seconds   INR 1.3 (H) 0.8 - 1.2    Comment: (NOTE) INR goal varies based on device and disease states. Performed at [facility], [address], [place], [place] [address]   Type and screen [facility]     Status: None   Collection Time: 10/31/22  3:06 PM  Result Value Ref Range   ABO/RH(D) A POS    Antibody Screen NEG    Sample Expiration      11/03/2022,2359 Performed at [facility], [address], [place], [place] [address]   Basic metabolic panel     Status: Abnormal   Collection Time: 10/31/22  4:15 PM  Result Value Ref Range   Sodium 138 135 - 145 mmol/L   Potassium 4.1 3.5 - 5.1 mmol/L   Chloride 105 98 - 111 mmol/L   CO2 27 22 - 32 mmol/L   Glucose, Bld 122 (H) 70 - 99 mg/dL    Comment: Glucose reference range applies only to samples taken after fasting for at least 8 hours.   BUN 22 8 - 23 mg/dL   Creatinine, Ser 2.95 0.44 - 1.00 mg/dL   Calcium 8.6 (L) 8.9 - 10.3 mg/dL   GFR, Estimated >28 >41 mL/min    Comment: (NOTE) Calculated using the CKD-EPI Creatinine Equation (2021)    Anion gap 6 5 - 15    Comment: Performed at [facility], [address] [place], [place] [address]   DG Hip Unilat With Pelvis 2-3 Views Left  Result Date: 10/31/2022 CLINICAL DATA:  Left hip injury, pain EXAM: DG HIP (WITH OR WITHOUT PELVIS) 2-3V LEFT COMPARISON:  06/13/2022 FINDINGS: Acute, comminuted intertrochanteric fracture of the proximal left femur with varus angulation. The lateral trochanteric fracture fragment is mildly displaced medially. Hip joint alignment is maintained without dislocation. Bony pelvis intact without fracture or diastasis. Soft tissue swelling at the fracture site. IMPRESSION: Acute, comminuted intertrochanteric fracture of the proximal left femur. Electronically Signed   By: Duanne Guess D.O.    On: 10/31/2022 16:04    Pending Labs [employer] (From admission, onward)     Start     Ordered  10/31/22 1847  Magnesium  Add-on,   AD        10/31/22 1846            Vitals/Pain Today's Vitals   10/31/22 1415 10/31/22 1700 10/31/22 1848  BP: (!) 179/85 (!) 157/83   Pulse: (!) 54 62   Resp: 13 11   Temp: 98.3 F (36.8 C)  98.3 F (36.8 C)  TempSrc:   Oral  SpO2: 93% 98%   Weight: 97.5 kg    Height: 5\' 5"  (1.651 m)    PainSc: 5       Isolation Precautions No active isolations  Medications Medications  losartan (COZAAR) tablet 100 mg (has no administration in time range)  metoprolol tartrate (LOPRESSOR) tablet 25 mg (has no administration in time range)  rosuvastatin (CRESTOR) tablet 20 mg (has no administration in time range)  citalopram (CELEXA) tablet 20 mg (has no administration in time range)  levothyroxine (SYNTHROID) tablet 88 mcg (has no administration in time range)  pantoprazole (PROTONIX) EC tablet 40 mg (has no administration in time range)  rOPINIRole (REQUIP) tablet 1 mg (has no administration in time range)  albuterol (VENTOLIN HFA) 108 (90 Base) MCG/ACT inhaler 1 puff (has no administration in time range)  fluticasone furoate-vilanterol (BREO ELLIPTA) 100-25 MCG/ACT 1 puff (1 puff Inhalation Not Given 10/31/22 1849)    And  umeclidinium bromide (INCRUSE ELLIPTA) 62.5 MCG/ACT 1 puff (1 puff Inhalation Not Given 10/31/22 1849)  HYDROcodone-acetaminophen (NORCO/VICODIN) 5-325 MG per tablet 1-2 tablet (2 tablets Oral Given 10/31/22 1843)  morphine (PF) 2 MG/ML injection 0.5 mg (has no administration in time range)  methocarbamol (ROBAXIN) tablet 500 mg (500 mg Oral Given 10/31/22 1843)    Or  methocarbamol (ROBAXIN) 500 mg in dextrose 5 % 50 mL IVPB ( Intravenous See Alternative 10/31/22 1843)  HYDROmorphone (DILAUDID) injection 0.5 mg (0.5 mg Intravenous Given 10/31/22 1609)  ondansetron (ZOFRAN) injection 4 mg (4 mg Intravenous Given 10/31/22 1505)     Mobility walks

## 2022-10-31 NOTE — Progress Notes (Signed)
Received consult from EDP.  Request transfer to [facility] for surgical repair on Monday due to recent intake of eliquis.  Medial optimization.  Full consult to follow.

## 2022-10-31 NOTE — ED Notes (Signed)
[name] given report at [facility]. [facility] arranged for transportation.

## 2022-10-31 NOTE — Assessment & Plan Note (Signed)
Continue levothyroxine 

## 2022-10-31 NOTE — ED Provider Notes (Signed)
[place] EMERGENCY DEPARTMENT AT [facility] Provider Note   CSN: [id number] Arrival date & time: 10/31/22  1406     History  Chief Complaint  Patient presents with   [name] is a 82 y.o. female.  HPI 82 year old female presents with a fall and left hip injury.  She was in the garage and thinks her feet got tripped up as she was turning around and she fell, striking her left lateral hip.  Did not hit her head.  She has a small bruise to her left arm but states that does not really hurt and is primarily her left hip.  EMS gave her 200 mcg of fentanyl in total.  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  She last took her dose this morning.  She feels like her leg is getting a little numb.  Home Medications Prior to Admission medications   Medication Sig Start Date End Date Taking? Authorizing Provider  albuterol (VENTOLIN HFA) 108 (90 Base) MCG/ACT inhaler Inhale 1-2 puffs into the lungs every 6 (six) hours as needed for wheezing or shortness of breath.   Yes [provider]  Cholecalciferol (VITAMIN D3 PO) Take 1 capsule by mouth daily.   Yes [provider]  citalopram (CELEXA) 20 MG tablet Take 1 tablet (20 mg total) by mouth at bedtime. DX F41.9 08/10/22  Yes [name], NP  donepezil (ARICEPT) 10 MG tablet TAKE 1 TABLET BY MOUTH EVERYDAY AT BEDTIME Patient taking differently: Take 10 mg by mouth at bedtime. 08/10/22  Yes Eubanks, Janene Harvey, NP  ELIQUIS 5 MG TABS tablet TAKE 1 TABLET BY MOUTH TWICE A DAY 10/22/22  Yes [name], MD  ferrous sulfate 325 (65 FE) MG EC tablet TAKE 1 TABLET BY MOUTH EVERY DAY WITH BREAKFAST Patient taking differently: Take 325 mg by mouth at bedtime. 07/28/22  Yes [name], NP  fluticasone (FLONASE) 50 MCG/ACT nasal spray SPRAY 1 SPRAY INTO EACH NOSTRIL DAILY Patient taking differently: Place 1-2 sprays into both nostrils in the morning and at bedtime. 09/14/22  Yes [name], MD  guaiFENesin (MUCINEX) 600 MG 12 hr tablet Take 1,200 mg by mouth as needed for to loosen phlegm.   Yes [provider]  ipratropium (ATROVENT) 0.03 % nasal spray SPRAY 2 SPRAYS INTO EACH NOSTRIL 3 TIMES A DAY AS NEEDED FOR RHINITIS Patient taking differently: Place 1 spray into both nostrils See admin instructions. Instill 1 spray into each nostril in the morning and at bedtime- as needed for unresolved allergic symptoms 09/14/22  Yes [name], MD  levothyroxine (SYNTHROID) 88 MCG tablet TAKE 1 TABLET BY MOUTH EVERY DAY IN THE MORNING Patient taking differently: Take 88 mcg by mouth daily before breakfast. 10/22/22  Yes Eubanks, Janene Harvey, NP  losartan (COZAAR) 100 MG tablet TAKE 1 TABLET BY MOUTH EVERY DAY Patient taking differently: Take 100 mg by mouth daily. 10/22/22  Yes [name], NP  Melatonin 10 MG TABS Take 10 mg by mouth at bedtime.   Yes [provider]  metFORMIN (GLUCOPHAGE) 500 MG tablet Take 1 tablet (500 mg total) by mouth daily with supper. 04/10/22  Yes [name], NP  metoprolol tartrate (LOPRESSOR) 25 MG tablet TAKE 1 TABLET BY MOUTH TWICE A DAY Patient taking differently: Take 37.5 mg by mouth in the morning and at bedtime. 09/14/22  Yes [name], NP  Multiple Vitamin (MULTIVITAMIN WITH MINERALS) TABS  tablet Take 1 tablet by mouth daily with breakfast.   Yes [provider]  pantoprazole (PROTONIX) 40 MG tablet TAKE 1 TABLET BY MOUTH EVERY DAY Patient taking differently: Take 40 mg by mouth at bedtime. 07/13/22  Yes [name], NP  rOPINIRole (REQUIP) 1 MG tablet TAKE 1 TABLET BY MOUTH EVERYDAY AT BEDTIME Patient taking differently: Take 1 mg by mouth at bedtime. 06/22/22  Yes [name], NP  rosuvastatin (CRESTOR) 20 MG tablet Take 1 tablet (20 mg total) by mouth daily. Patient taking differently: Take 20 mg by mouth at bedtime. 08/18/22 11/16/22 Yes [name], [name], MD  tacrolimus (PROGRAF) 1 MG capsule  Take 1 mg by mouth in the morning and at bedtime. 01/03/19  Yes [provider]  TRELEGY ELLIPTA 100-62.5-25 MCG/ACT AEPB INHALE 1 PUFF BY MOUTH EVERY DAY Patient taking differently: Inhale 1 puff into the lungs in the morning. 08/25/22  Yes [name], MD  TYLENOL 325 MG CAPS Take 325-650 mg by mouth every 6 (six) hours as needed (for pain or headaches).   Yes [provider]  montelukast (SINGULAIR) 10 MG tablet TAKE 1 TABLET BY MOUTH EVERYDAY AT BEDTIME Patient not taking: Reported on 10/31/2022 03/12/22   [name], MD      Allergies    Patient has no known allergies.    Review of Systems   Review of Systems  Gastrointestinal:  Negative for abdominal pain.  Musculoskeletal:  Positive for arthralgias.  Neurological:  Negative for headaches.    Physical Exam Updated Vital Signs BP (!) 149/79 (BP Location: Right Arm)   Pulse 68   Temp 98.4 F (36.9 C) (Oral)   Resp 18   Ht 5\' 5"  (1.651 m)   Wt 97.5 kg   SpO2 100%   BMI 35.77 kg/m  Physical Exam Vitals and nursing note reviewed.  Constitutional:      General: She is not in acute distress.    Appearance: She is well-developed. She is obese.  HENT:     Head: Normocephalic and atraumatic.  Cardiovascular:     Rate and Rhythm: Normal rate and regular rhythm.     Pulses:          Dorsalis pedis pulses are 2+ on the left side.  Pulmonary:     Effort: Pulmonary effort is normal.  Abdominal:     General: There is no distension.     Tenderness: There is no abdominal tenderness.  Musculoskeletal:     Left hip: Deformity and tenderness present.     Left upper leg: No tenderness.     Left knee: No tenderness.     Comments: Grossly normal sensation in left foot and leg. Able to move toes/foot without difficulty.  Skin:    General: Skin is warm and dry.  Neurological:     Mental Status: She is alert.     ED Results / Procedures / Treatments   Labs (all labs ordered are listed, but only abnormal results  are displayed) Labs Reviewed  PROTIME-INR - Abnormal; Notable for the following components:      Result Value   Prothrombin Time 16.0 (*)    INR 1.3 (*)    All other components within normal limits  BASIC METABOLIC PANEL - Abnormal; Notable for the following components:   Glucose, Bld 122 (*)    Calcium 8.6 (*)    All other components within normal limits  CBC WITH DIFFERENTIAL/PLATELET  MAGNESIUM  TYPE AND SCREEN  EKG EKG Interpretation Date/Time:  Saturday October 31 2022 14:16:49 EDT Ventricular Rate:  56 PR Interval:  192 QRS Duration:  94 QT Interval:  464 QTC Calculation: 448 R Axis:   24  Text Interpretation: Sinus bradycardia nonspecific ST changes Confirmed by Pricilla Loveless [phone]) on 10/31/2022 2:39:31 PM  Radiology Chest Portable 1 View  Result Date: 10/31/2022 CLINICAL DATA:  Preop hip fracture EXAM: PORTABLE CHEST 1 VIEW COMPARISON:  Chest x-ray 06/19/2022 FINDINGS: The heart is mildly enlarged, unchanged. The lungs are clear. There is no pleural effusion or pneumothorax. No acute fractures are seen. IMPRESSION: 1. No active disease. 2. Mild cardiomegaly. Electronically Signed   By: Darliss Cheney M.D.   On: 10/31/2022 19:07   DG Hip Unilat With Pelvis 2-3 Views Left  Result Date: 10/31/2022 CLINICAL DATA:  Left hip injury, pain EXAM: DG HIP (WITH OR WITHOUT PELVIS) 2-3V LEFT COMPARISON:  06/13/2022 FINDINGS: Acute, comminuted intertrochanteric fracture of the proximal left femur with varus angulation. The lateral trochanteric fracture fragment is mildly displaced medially. Hip joint alignment is maintained without dislocation. Bony pelvis intact without fracture or diastasis. Soft tissue swelling at the fracture site. IMPRESSION: Acute, comminuted intertrochanteric fracture of the proximal left femur. Electronically Signed   By: Duanne Guess D.O.   On: 10/31/2022 16:04    Procedures Procedures    Medications Ordered in ED Medications  losartan (COZAAR)  tablet 100 mg (has no administration in time range)  metoprolol tartrate (LOPRESSOR) tablet 25 mg (has no administration in time range)  rosuvastatin (CRESTOR) tablet 20 mg (20 mg Oral Given 10/31/22 1958)  citalopram (CELEXA) tablet 20 mg (has no administration in time range)  levothyroxine (SYNTHROID) tablet 88 mcg (has no administration in time range)  pantoprazole (PROTONIX) EC tablet 40 mg (has no administration in time range)  rOPINIRole (REQUIP) tablet 1 mg (has no administration in time range)  albuterol (PROVENTIL) (2.5 MG/3ML) 0.083% nebulizer solution 2.5 mg (has no administration in time range)  fluticasone furoate-vilanterol (BREO ELLIPTA) 100-25 MCG/ACT 1 puff (1 puff Inhalation Not Given 10/31/22 1849)    And  umeclidinium bromide (INCRUSE ELLIPTA) 62.5 MCG/ACT 1 puff (1 puff Inhalation Not Given 10/31/22 1849)  HYDROcodone-acetaminophen (NORCO/VICODIN) 5-325 MG per tablet 1-2 tablet (2 tablets Oral Given 10/31/22 1843)  morphine (PF) 2 MG/ML injection 0.5 mg (has no administration in time range)  methocarbamol (ROBAXIN) tablet 500 mg (500 mg Oral Given 10/31/22 1843)    Or  methocarbamol (ROBAXIN) 500 mg in dextrose 5 % 50 mL IVPB ( Intravenous See Alternative 10/31/22 1843)  HYDROmorphone (DILAUDID) injection 0.5 mg (0.5 mg Intravenous Given 10/31/22 1609)  ondansetron (ZOFRAN) injection 4 mg (4 mg Intravenous Given 10/31/22 1505)    ED Course/ Medical Decision Making/ A&P                                 Medical Decision Making Amount and/or Complexity of Data Reviewed Labs: ordered.    Details: Normal WBC Radiology: ordered and independent interpretation performed.    Details: Left hip fracture ECG/medicine tests: ordered and independent interpretation performed.    Details: Nonspecific changes  Risk Prescription drug management. Decision regarding hospitalization.   Patient presents after a mechanical fall and now has a left hip fracture.  It is closed and she seems  to be neurovascular intact.  Reports some tingling in her leg but no focal deficits appreciated.  Good pulse.  Originally discussed with  [facility], Dr. Aundria Rud, but they are unable to treat her due to the list of OR cases despite her having been operated on by Dr. Constance Goltz in the past.  Discussed with Dr. Roda Shutters, who will take to OR on Monday, 9/23.  He would like her transferred to [facility] and to have 10 pounds of Buck's traction.  Otherwise, will be admitted to the hospitalist service, discussed with Dr. Para March.       Final Clinical Impression(s) / ED Diagnoses Final diagnoses:  Closed displaced intertrochanteric fracture of left femur, initial encounter [facility])    Rx / DC Orders ED Discharge Orders     None         Pricilla Loveless, MD 10/31/22 2034

## 2022-10-31 NOTE — Assessment & Plan Note (Signed)
Patient intolerant of CPAP

## 2022-11-01 DIAGNOSIS — I4719 Other supraventricular tachycardia: Secondary | ICD-10-CM | POA: Diagnosis not present

## 2022-11-01 DIAGNOSIS — S72142A Displaced intertrochanteric fracture of left femur, initial encounter for closed fracture: Secondary | ICD-10-CM | POA: Diagnosis not present

## 2022-11-01 DIAGNOSIS — Z01810 Encounter for preprocedural cardiovascular examination: Secondary | ICD-10-CM

## 2022-11-01 LAB — GLUCOSE, CAPILLARY
Glucose-Capillary: 123 mg/dL — ABNORMAL HIGH (ref 70–99)
Glucose-Capillary: 110 mg/dL — ABNORMAL HIGH (ref 70–99)
Glucose-Capillary: 141 mg/dL — ABNORMAL HIGH (ref 70–99)

## 2022-11-01 MED ORDER — ENSURE ENLIVE PO LIQD
237.0000 mL | Freq: Two times a day (BID) | ORAL | Status: DC
  Administered 2022-11-01 – 2022-11-06 (×8): 237 mL via ORAL

## 2022-11-01 NOTE — Progress Notes (Signed)
Initial Nutrition Assessment  DOCUMENTATION CODES:   Obesity unspecified  INTERVENTION:  Ensure BID   NUTRITION DIAGNOSIS:   Increased nutrient needs related to hip fracture as evidenced by estimated needs.    GOAL:   Patient will meet greater than or equal to 90% of their needs    MONITOR:   PO intake, Supplement acceptance, Labs, Weight trends  REASON FOR ASSESSMENT:   Consult Assessment of nutrition requirement/status  ASSESSMENT:  82 y.o. F, presented to the ED following an accidental fall when she tripped after turning around in the garage falling onto her left hip. Pmhx; COPD, HTN, OSA, SVA and PAT, Arthritis, GERD. Ortho following with rec's for Buck's traction and surgery Monday. No appetite or weight changes noted.  Pt reports good appetite with no concerns. UBW 210-215#. No weight loss noted.RN reports small amount of breakfast and lunch. LLE non-pitting edema noted.  Prior to admission ambulating on own. Able to self feed with no chewing swallowing concerns noted.  Meds;REQUIP,  Labs;Reviewed.   NUTRITION - FOCUSED PHYSICAL EXAM:  Flowsheet Row Most Recent Value  Orbital Region Unable to assess  Upper Arm Region Unable to assess  Thoracic and Lumbar Region Unable to assess  Buccal Region Unable to assess  [place] Region Unable to assess  Clavicle Bone Region Unable to assess  Clavicle and Acromion Bone Region Unable to assess  Scapular Bone Region Unable to assess  Dorsal Hand Unable to assess  Patellar Region Unable to assess  Anterior Thigh Region Unable to assess  Posterior Calf Region Unable to assess  Edema (RD Assessment) Unable to assess  Hair Unable to assess  Eyes Unable to assess  Mouth Unable to assess  Skin Unable to assess  Nails Unable to assess       Diet Order:   Diet Order             Diet NPO time specified Except for: [employer], Sips with Meds  Diet effective midnight           Diet Heart Room service appropriate? Yes;  Fluid consistency: Thin  Diet effective now                   EDUCATION NEEDS:   No education needs have been identified at this time  Skin:  Skin Assessment: Reviewed RN Assessment  Last BM:  9/221  Height:   Ht Readings from Last 1 Encounters:  10/31/22 5\' 5"  (1.651 m)    Weight:   Wt Readings from Last 1 Encounters:  10/31/22 97.5 kg    Ideal Body Weight:  57 kg  BMI:  Body mass index is 35.77 kg/m.  Estimated Nutritional Needs:   Kcal:  1700-2000 Kcal  Protein:  70-90 g  Fluid:  53ml/kcal    Ricardo Jericho, RDN, LDN

## 2022-11-01 NOTE — Plan of Care (Signed)
Problem: Education: Goal: Knowledge of General Education information will improve Description: Including pain rating scale, medication(s)/side effects and non-pharmacologic comfort measures Outcome: Progressing   Problem: Health Behavior/Discharge Planning: Goal: Ability to manage health-related needs will improve Outcome: Progressing   Problem: Clinical Measurements: Goal: Ability to maintain clinical measurements within normal limits will improve Outcome: Progressing Goal: Will remain free from infection Outcome: Progressing Goal: Diagnostic test results will improve Outcome: Progressing Goal: Respiratory complications will improve Outcome: Progressing Goal: Cardiovascular complication will be avoided Outcome: Progressing   Problem: Elimination: Goal: Will not experience complications related to bowel motility Outcome: Progressing Goal: Will not experience complications related to urinary retention Outcome: Progressing   Problem: Coping: Goal: Level of anxiety will decrease Outcome: Progressing   Problem: Pain Managment: Goal: General experience of comfort will improve Outcome: Progressing   Problem: Safety: Goal: Ability to remain free from injury will improve Outcome: Progressing

## 2022-11-01 NOTE — Progress Notes (Signed)
Orthopedic Tech Progress Note Patient Details:  [name] [birth year] [id number]  Musculoskeletal Traction Type of Traction: Bucks Skin Traction Traction Location: lle Traction Weight: 10 lbs   Post Interventions Patient Tolerated: Well Instructions Provided: Care of device, Adjustment of device  Trinna Post 11/01/2022, 6:23 AM

## 2022-11-01 NOTE — Progress Notes (Addendum)
PROGRESS NOTE    [name]  KGU:[id number] DOB: 15-Nov-1940 DOA: 10/31/2022 PCP: [name], NP   Brief Narrative:  82 y.o. 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  She was found to have acute comminuted intertrochanteric fracture of the proximal left femur.  Orthopedics recommended Buck's traction and admission to [facility] for surgery on Monday.  Assessment & Plan:   Acute closed comminuted intertrochanteric fracture of the proximal left femur after a mechanical fall -Continue pain management.  Fall precautions. - Orthopedics recommended Buck's traction and admission to [facility] for surgery on Monday.  History of PE on Eliquis -Eliquis on hold for now.  History of PAT/SVT/wide-complex tachycardia on Zio patch -Continue metoprolol.  Currently rate controlled.  I have consulted cardiology for the same and also for preoperative cardiac clearance  Essential hypertension Hyperlipidemia -Monitor blood pressure.  Continue statin, metoprolol and losartan.  Hypothyroidism -Continue levothyroxine  COPD -Currently stable.  Continue current inhaled regimen  OSA -Patient intolerant of CPAP  Obesity -Outpatient follow-up  Minimal-change disease -Continue tacrolimus.  Outpatient follow-up with nephrology.  DVT prophylaxis: None for surgical intervention Code Status: Full Family Communication: None at bedside Disposition Plan: Status is: Inpatient Remains inpatient appropriate because: Of need for surgical  intervention  Consultants: Orthopedic/cardiology  Procedures: None  Antimicrobials: None   Subjective: Patient seen and examined at bedside.  Complains of left hip pain.  No fever, vomiting, chest pain reported.  Objective: Vitals:   10/31/22 2240 11/01/22 0523 11/01/22 0738 11/01/22 0927  BP: (!) 163/66 128/79 135/61   Pulse: 66 68 61   Resp: 16  16   Temp: 98.6 F (37 C)  98.7 F (37.1 C)   TempSrc: Oral  Oral   SpO2: 97% 96% 97% 95%  Weight:      Height:       No intake or output data in the 24 hours ending 11/01/22 0930 Filed Weights   10/31/22 1415  Weight: 97.5 kg    Examination:  General exam: Appears calm and comfortable.  On 2 L oxygen via nasal cannula. Respiratory system: Bilateral decreased breath sounds at bases with some scattered crackles Cardiovascular system: S1 & S2 heard, Rate controlled Gastrointestinal system: Abdomen is obese, nondistended, soft and nontender. Normal bowel sounds heard. Extremities: Left hip tenderness present.  Left lower extremity is in Buck's traction.  Central nervous system: Alert and oriented. No focal neurological deficits. Moving extremities Skin: No rashes, lesions or ulcers Psychiatry: Judgement and insight appear normal. Mood & affect appropriate.     Data Reviewed: I have personally reviewed following labs and imaging studies  CBC: Recent Labs  Lab 10/31/22 1506  WBC 9.4  NEUTROABS 7.7  HGB 12.5  HCT 40.8  MCV 92.9  PLT 243   Basic Metabolic Panel: Recent Labs  Lab 10/31/22 1615  NA 138  K 4.1  CL 105  CO2 27  GLUCOSE 122*  BUN 22  CREATININE 0.91  CALCIUM 8.6*   GFR: Estimated Creatinine Clearance: 54.1 mL/min (by C-G formula based on SCr of 0.91 mg/dL). Liver Function Tests: No results for input(s): "AST", "ALT", "  ALKPHOS", "BILITOT", "PROT", "ALBUMIN" in the last 168 hours. No results for input(s): "LIPASE", "AMYLASE" in the last 168 hours. No results for input(s): "AMMONIA" in the last  168 hours. Coagulation Profile: Recent Labs  Lab 10/31/22 1506  INR 1.3*   Cardiac Enzymes: No results for input(s): "CKTOTAL", "CKMB", "CKMBINDEX", "TROPONINI" in the last 168 hours. BNP (last 3 results) No results for input(s): "PROBNP" in the last 8760 hours. HbA1C: No results for input(s): "HGBA1C" in the last 72 hours. CBG: Recent Labs  Lab 10/31/22 2302 11/01/22 0736  GLUCAP 117* 123*   Lipid Profile: No results for input(s): "CHOL", "HDL", "LDLCALC", "TRIG", "CHOLHDL", "LDLDIRECT" in the last 72 hours. Thyroid Function Tests: No results for input(s): "TSH", "T4TOTAL", "FREET4", "T3FREE", "THYROIDAB" in the last 72 hours. Anemia Panel: No results for input(s): "VITAMINB12", "FOLATE", "FERRITIN", "TIBC", "IRON", "RETICCTPCT" in the last 72 hours. Sepsis Labs: No results for input(s): "PROCALCITON", "LATICACIDVEN" in the last 168 hours.  No results found for this or any previous visit (from the past 240 hour(s)).       Radiology Studies: Chest Portable 1 View  Result Date: 10/31/2022 CLINICAL DATA:  Preop hip fracture EXAM: PORTABLE CHEST 1 VIEW COMPARISON:  Chest x-ray 06/19/2022 FINDINGS: The heart is mildly enlarged, unchanged. The lungs are clear. There is no pleural effusion or pneumothorax. No acute fractures are seen. IMPRESSION: 1. No active disease. 2. Mild cardiomegaly. Electronically Signed   By: Darliss Cheney M.D.   On: 10/31/2022 19:07   DG Hip Unilat With Pelvis 2-3 Views Left  Result Date: 10/31/2022 CLINICAL DATA:  Left hip injury, pain EXAM: DG HIP (WITH OR WITHOUT PELVIS) 2-3V LEFT COMPARISON:  06/13/2022 FINDINGS: Acute, comminuted intertrochanteric fracture of the proximal left femur with varus angulation. The lateral trochanteric fracture fragment is mildly displaced medially. Hip joint alignment is maintained without dislocation. Bony pelvis intact without fracture or diastasis. Soft tissue swelling at the fracture site. IMPRESSION: Acute,  comminuted intertrochanteric fracture of the proximal left femur. Electronically Signed   By: Duanne Guess D.O.   On: 10/31/2022 16:04        Scheduled Meds:  citalopram  20 mg Oral QHS   fluticasone furoate-vilanterol  1 puff Inhalation Daily   And   umeclidinium bromide  1 puff Inhalation Daily   levothyroxine  88 mcg Oral Q0600   losartan  100 mg Oral Daily   metoprolol tartrate  25 mg Oral BID   pantoprazole  40 mg Oral Daily   rOPINIRole  1 mg Oral QHS   rosuvastatin  20 mg Oral Daily   Continuous Infusions:  methocarbamol (ROBAXIN) IV            Glade Lloyd, MD [facility] 11/01/2022, 9:30 AM

## 2022-11-01 NOTE — Consult Note (Addendum)
Cardiology Consultation   Patient ID: Jatziry Heffelfinger MRN: [id number]; DOB: [birth year]  Admit date: 10/31/2022 Date of Consult: 11/01/2022  PCP:  [name], NP   [facility] Providers Cardiologist:  Peter [name], MD        Patient Profile:   Sidonie Rosander is a 82 y.o. female with a hx of paroxysmal atrial tachycardia, COPD, GERD, PE (on Eliquis), hyperlipidemia, hypertension, OSA (intolerant of CPAP), thyroid disease, CKD with minimal change who is being seen 11/01/2022 for surgical risk stratification at the request of Dr. Hanley Ben.  History of Present Illness:   Ms. Stem presented to the ED via EMS after a mechanical fall in her garage onto left hip. CXR in the ED noted acute comminuted intertrochanteric fracture of the proximal left femur. Patient was admitted and is pending surgical repair on Monday 11/02/22.   She was admitted in May with acute gallstone pancreatitis. Had ERCP and then lap choly. Post op developed hypoxia due to volume overload and atelectasis. She had atrial tachycardia during her hospital stay with strips reviewed from 5/6-5/11/24. Echo unremarkable. Was discharged on metoprolol. Later had an event monitor showing recurrent SVT and metoprolol dose was increased. She was asymptomatic.   Since her last visit with cardiology, patient has remained symptom free with no angina, worsening shortness of breath (has baseline dyspnea with COPD), no palpitations. She has limited exertional tolerance with her COPD but is able to walk 1-2 blocks on flat ground as well as climb a flight of stairs. She confirms that there were no pre-syncopal symptoms with her recent fall and she is very sure that it was caused by her shoe catching/resulting in her being tripped.   Past Medical History:  Diagnosis Date   Actinic keratosis    Arthritis    Asthma    Back injury 2019   Fracture   Basal cell carcinoma    COPD (chronic obstructive pulmonary  disease) (HCC)    GERD (gastroesophageal reflux disease)    H/O blood clots 2020   Lung, Per [facility] New Patient Packet   H/O mammogram 2022   Per [facility] New Patient Packet   Heart murmur    hx of in childhood    High cholesterol    Hyperlipidemia    Hypertension    Hypothyroidism    Kidney disease    Per [facility] New Patient Packet   MCNS (minimal change nephrotic syndrome)    OSA (obstructive sleep apnea) 08/29/2019   Osteopenia    Shortness of breath dyspnea    on exertion    Squamous cell carcinoma    Thyroid disease    Per [facility] New Patient Packet    Past Surgical History:  Procedure Laterality Date   ABDOMINAL HYSTERECTOMY     APPENDECTOMY     BILATERAL SALPINGOOPHORECTOMY     Ovarian Cysts    CHOLECYSTECTOMY N/A 06/17/2022   Procedure: LAPAROSCOPIC CHOLECYSTECTOMY;  Surgeon: Fritzi Mandes, MD;  Location: [facility] ORS;  Service: General;  Laterality: N/A;   COLONOSCOPY  2001   Dr.Mann, Per [facility] New Patient Packet   CONVERSION TO TOTAL KNEE Right 07/10/2014   Procedure: RIGHT CONVERSION OF PARTIAL KNEE TO TOTAL KNEE;  Surgeon: [name], MD;  Location: [facility];  Service: Orthopedics;  Laterality: Right;   ENDOSCOPIC RETROGRADE CHOLANGIOPANCREATOGRAPHY (ERCP) WITH PROPOFOL N/A 06/15/2022   Procedure: ENDOSCOPIC RETROGRADE CHOLANGIOPANCREATOGRAPHY (ERCP) WITH PROPOFOL;  Surgeon: Vida Rigger, MD;  Location: [facility];  Service: Gastroenterology;  Laterality: N/A;  IR ANGIOGRAM PULMONARY BILATERAL SELECTIVE  02/08/2019   IR ANGIOGRAM SELECTIVE EACH ADDITIONAL VESSEL  02/08/2019   IR ANGIOGRAM SELECTIVE EACH ADDITIONAL VESSEL  02/08/2019   IR INFUSION THROMBOL ARTERIAL INITIAL (MS)  02/08/2019   IR INFUSION THROMBOL ARTERIAL INITIAL (MS)  02/08/2019   IR THROMB F/U EVAL ART/VEN FINAL DAY (MS)  02/09/2019   IR US GUIDE VASC ACCESS RIGHT  02/08/2019   MEDIAL PARTIAL KNEE REPLACEMENT Bilateral    MOHS SURGERY     x 2   REMOVAL OF STONES  06/15/2022   Procedure: REMOVAL OF STONES;  Surgeon:  Vida Rigger, MD;  Location: [facility] ENDOSCOPY;  Service: Gastroenterology;;   RENAL BIOPSY     x 2   REPLACEMENT TOTAL KNEE  2012   Per [facility] New Patient Packet   ROTATOR CUFF REPAIR Left 2008   SPHINCTEROTOMY  06/15/2022   Procedure: SPHINCTEROTOMY;  Surgeon: Vida Rigger, MD;  Location: [facility] ENDOS[facility];  Service: Gastroenterology;;     Home Medications:  Prior to Admission medications   Medication Sig Start Date End Date Taking? Authorizing Provider  albuterol (VENTOLIN HFA) 108 (90 Base) MCG/ACT inhaler Inhale 1-2 puffs into the lungs every 6 (six) hours as needed for wheezing or shortness of breath.   Yes [provider]  Cholecalciferol (VITAMIN D3 PO) Take 1 capsule by mouth daily.   Yes [provider]  citalopram (CELEXA) 20 MG tablet Take 1 tablet (20 mg total) by mouth at bedtime. DX F41.9 08/10/22  Yes [name], NP  donepezil (ARICEPT) 10 MG tablet TAKE 1 TABLET BY MOUTH EVERYDAY AT BEDTIME Patient taking differently: Take 10 mg by mouth at bedtime. 08/10/22  Yes Eubanks, Janene Harvey, NP  ELIQUIS 5 MG TABS tablet TAKE 1 TABLET BY MOUTH TWICE A DAY 10/22/22  Yes [name], MD  ferrous sulfate 325 (65 FE) MG EC tablet TAKE 1 TABLET BY MOUTH EVERY DAY WITH BREAKFAST Patient taking differently: Take 325 mg by mouth at bedtime. 07/28/22  Yes [name], NP  fluticasone (FLONASE) 50 MCG/ACT nasal spray SPRAY 1 SPRAY INTO EACH NOSTRIL DAILY Patient taking differently: Place 1-2 sprays into both nostrils in the morning and at bedtime. 09/14/22  Yes [name], MD  guaiFENesin (MUCINEX) 600 MG 12 hr tablet Take 1,200 mg by mouth as needed for to loosen phlegm.   Yes [provider]  ipratropium (ATROVENT) 0.03 % nasal spray SPRAY 2 SPRAYS INTO EACH NOSTRIL 3 TIMES A DAY AS NEEDED FOR RHINITIS Patient taking differently: Place 1 spray into both nostrils See admin instructions. Instill 1 spray into each nostril in the morning and at bedtime- as needed for  unresolved allergic symptoms 09/14/22  Yes [name], MD  levothyroxine (SYNTHROID) 88 MCG tablet TAKE 1 TABLET BY MOUTH EVERY DAY IN THE MORNING Patient taking differently: Take 88 mcg by mouth daily before breakfast. 10/22/22  Yes Eubanks, Janene Harvey, NP  losartan (COZAAR) 100 MG tablet TAKE 1 TABLET BY MOUTH EVERY DAY Patient taking differently: Take 100 mg by mouth daily. 10/22/22  Yes [name], NP  Melatonin 10 MG TABS Take 10 mg by mouth at bedtime.   Yes [provider]  metFORMIN (GLUCOPHAGE) 500 MG tablet Take 1 tablet (500 mg total) by mouth daily with supper. 04/10/22  Yes [name], NP  metoprolol tartrate (LOPRESSOR) 25 MG tablet TAKE 1 TABLET BY MOUTH TWICE A DAY Patient taking differently: Take 37.5 mg by mouth in the morning and at bedtime.  09/14/22  Yes [name], NP  Multiple Vitamin (MULTIVITAMIN WITH MINERALS) TABS tablet Take 1 tablet by mouth daily with breakfast.   Yes [provider]  pantoprazole (PROTONIX) 40 MG tablet TAKE 1 TABLET BY MOUTH EVERY DAY Patient taking differently: Take 40 mg by mouth at bedtime. 07/13/22  Yes [name], NP  rOPINIRole (REQUIP) 1 MG tablet TAKE 1 TABLET BY MOUTH EVERYDAY AT BEDTIME Patient taking differently: Take 1 mg by mouth at bedtime. 06/22/22  Yes [name], NP  rosuvastatin (CRESTOR) 20 MG tablet Take 1 tablet (20 mg total) by mouth daily. Patient taking differently: Take 20 mg by mouth at bedtime. 08/18/22 11/16/22 Yes [name], [name], MD  tacrolimus (PROGRAF) 1 MG capsule Take 1 mg by mouth in the morning and at bedtime. 01/03/19  Yes [provider]  TRELEGY ELLIPTA 100-62.5-25 MCG/ACT AEPB INHALE 1 PUFF BY MOUTH EVERY DAY Patient taking differently: Inhale 1 puff into the lungs in the morning. 08/25/22  Yes [name], MD  TYLENOL 325 MG CAPS Take 325-650 mg by mouth every 6 (six) hours as needed (for pain or headaches).   Yes [provider]  montelukast  (SINGULAIR) 10 MG tablet TAKE 1 TABLET BY MOUTH EVERYDAY AT BEDTIME Patient not taking: Reported on 10/31/2022 03/12/22   [name], MD    Inpatient Medications: Scheduled Meds:  citalopram  20 mg Oral QHS   fluticasone furoate-vilanterol  1 puff Inhalation Daily   And   umeclidinium bromide  1 puff Inhalation Daily   levothyroxine  88 mcg Oral Q0600   losartan  100 mg Oral Daily   metoprolol tartrate  25 mg Oral BID   pantoprazole  40 mg Oral Daily   rOPINIRole  1 mg Oral QHS   rosuvastatin  20 mg Oral Daily   Continuous Infusions:  methocarbamol (ROBAXIN) IV     PRN Meds: albuterol, HYDROcodone-acetaminophen, methocarbamol **OR** methocarbamol (ROBAXIN) IV, morphine injection  Allergies:   No Known Allergies  Social History:   Social History   Tobacco Use   Smoking status: Former    Current packs/day: 0.00    Average packs/day: 1.5 packs/day for 25.0 years (37.5 ttl pk-yrs)    Types: Cigarettes    Start date: 01/13/1948    Quit date: 01/12/1973    Years since quitting: 49.8   Smokeless tobacco: Never  Substance Use Topics   Alcohol use: Yes    Alcohol/week: 0.5 standard drinks of alcohol    Types: 1 Standard drinks or equivalent per week    Comment: She occasionally drinks a glass of wine.       Family History:    Family History  Problem Relation Age of Onset   COPD Mother        Husband Smoked    Lymphoma Father    Hypertension Sister    Scoliosis Daughter    Stroke Maternal Grandfather    Rheumatic fever Paternal Grandmother    Heart disease Paternal Grandfather      ROS:  Please see the history of present illness.  All other ROS reviewed and negative.     Physical Exam/Data:   Vitals:   10/31/22 2240 11/01/22 0523 11/01/22 0738 11/01/22 0927  BP: (!) 163/66 128/79 135/61   Pulse: 66 68 61   Resp: 16  16   Temp: 98.6 F (37 C)  98.7 F (37.1 C)   TempSrc: Oral  Oral   SpO2: 97% 96% 97% 95%  Weight:  Height:       No intake or output  data in the 24 hours ending 11/01/22 0931    10/31/2022    2:15 PM 10/13/2022    3:16 PM 09/23/2022   11:45 AM  Last 3 Weights  Weight (lbs) 214 lb 15.2 oz 215 lb 213 lb 9.6 oz  Weight (kg) 97.5 kg 97.523 kg 96.888 kg     Body mass index is 35.77 kg/m.  General:  Well nourished, well developed, in no acute distress HEENT: normal Neck: no JVD Vascular: No carotid bruits; Distal pulses 2+ bilaterally Cardiac:  normal S1, S2; RRR; systolic murmur at RUSB, consistent with AS Lungs:  clear to auscultation bilaterally, no wheezing, rhonchi or rales  Abd: soft, nontender, no hepatomegaly  Ext: no edema Musculoskeletal:  Left leg held in traction. Skin: warm and dry  Neuro:  CNs 2-12 intact, no focal abnormalities noted Psych:  Normal affect   EKG:  The EKG was personally reviewed and demonstrates:  10/31/22 tracing with sinus bradycardia. No acute ischemic changes.  Telemetry:  Telemetry was personally reviewed and demonstrates:  sinus rhythm with paroxysmal atrial tachycardia, rates up to ~120. No sustained arrhythmia.   Relevant CV Studies:  06/15/22 TTE  IMPRESSIONS    1. Left ventricular ejection fraction, by estimation, is 50 to 55%. The  left ventricle has low normal function. The left ventricle has no regional  wall motion abnormalities. There is mild concentric left ventricular  hypertrophy. Left ventricular  diastolic parameters are consistent with Grade I diastolic dysfunction  (impaired relaxation).   2. Right ventricular systolic function is normal. The right ventricular  size is normal. Tricuspid regurgitation signal is inadequate for assessing  PA pressure.   3. Left atrial size was mildly dilated.   4. Right atrial size was mildly dilated.   5. The mitral valve is normal in structure. Trivial mitral valve  regurgitation. No evidence of mitral stenosis.   6. The aortic valve is tricuspid. There is mild calcification of the  aortic valve. Aortic valve regurgitation is  not visualized. Mild aortic  valve stenosis. Aortic valve area, by VTI measures 1.67 cm. Aortic valve  mean gradient measures 9.0 mmHg.   7. The inferior vena cava is dilated in size with <50% respiratory  variability, suggesting right atrial pressure of 15 mmHg.   FINDINGS   Left Ventricle: Left ventricular ejection fraction, by estimation, is 50  to 55%. The left ventricle has low normal function. The left ventricle has  no regional wall motion abnormalities. The left ventricular internal  cavity size was normal in size.  There is mild concentric left ventricular hypertrophy. Left ventricular  diastolic parameters are consistent with Grade I diastolic dysfunction  (impaired relaxation).   Right Ventricle: The right ventricular size is normal. No increase in  right ventricular wall thickness. Right ventricular systolic function is  normal. Tricuspid regurgitation signal is inadequate for assessing PA  pressure.   Left Atrium: Left atrial size was mildly dilated.   Right Atrium: Right atrial size was mildly dilated.   Pericardium: There is no evidence of pericardial effusion.   Mitral Valve: The mitral valve is normal in structure. There is mild  calcification of the mitral valve leaflet(s). Mild mitral annular  calcification. Trivial mitral valve regurgitation. No evidence of mitral  valve stenosis. MV peak gradient, 5.7 mmHg.  The mean mitral valve gradient is 3.0 mmHg.   Tricuspid Valve: The tricuspid valve is normal in structure. Tricuspid  valve regurgitation  is not demonstrated.   Aortic Valve: The aortic valve is tricuspid. There is mild calcification  of the aortic valve. Aortic valve regurgitation is not visualized. Mild  aortic stenosis is present. Aortic valve mean gradient measures 9.0 mmHg.  Aortic valve peak gradient measures   15.2 mmHg. Aortic valve area, by VTI measures 1.67 cm.   Pulmonic Valve: The pulmonic valve was normal in structure. Pulmonic valve   regurgitation is not visualized.   Aorta: The aortic root is normal in size and structure.   Venous: The inferior vena cava is dilated in size with less than 50%  respiratory variability, suggesting right atrial pressure of 15 mmHg.   IAS/Shunts: No atrial level shunt detected by color flow Doppler.    07/29/22 Zio Patch Results  Patch Wear Time:  13 days and 23 hours (2024-05-15T14:27:01-399 to 2024-05-29T14:27:09-398)   Patient had a min HR of 49 bpm, max HR of 197 bpm, and avg HR of 66 bpm. Predominant underlying rhythm was Sinus Rhythm. 1 run of Ventricular Tachycardia occurred lasting 22.1 secs with a max rate of 158 bpm (avg 115 bpm). 1516 Supraventricular  Tachycardia runs occurred, the run with the fastest interval lasting 12.1 secs with a max rate of 197 bpm, the longest lasting 38.9 secs with an avg rate of 116 bpm. Isolated SVEs were occasional (3.4%, 45187), SVE Couplets were occasional (2.4%, 16459),  and SVE Triplets were rare (<1.0%, 1104). Isolated VEs were rare (<1.0%, 2324), VE Couplets were rare (<1.0%, 20), and VE Triplets were rare (<1.0%, 2). Ventricular Bigeminy and Trigeminy were present.    1 run of wide-complex tachycardia (115 bpm) lasting 22 seconds (could VT or SVT with aberrancy) - also, numerous PSVT runs - over 1500 runs were recorded, lasting up to 38 seconds again with an average rate of 116 bpm.    Laboratory Data:  High Sensitivity Troponin:  No results for input(s): "TROPONINIHS" in the last 720 hours.   Chemistry Recent Labs  Lab 10/31/22 1615  NA 138  K 4.1  CL 105  CO2 27  GLUCOSE 122*  BUN 22  CREATININE 0.91  CALCIUM 8.6*  GFRNONAA >60  ANIONGAP 6    Hematology Recent Labs  Lab 10/31/22 1506  WBC 9.4  RBC 4.39  HGB 12.5  HCT 40.8  MCV 92.9  MCH 28.5  MCHC 30.6  RDW 15.2  PLT 243    Radiology/Studies:  Chest Portable 1 View  Result Date: 10/31/2022 CLINICAL DATA:  Preop hip fracture EXAM: PORTABLE CHEST 1 VIEW  COMPARISON:  Chest x-ray 06/19/2022 FINDINGS: The heart is mildly enlarged, unchanged. The lungs are clear. There is no pleural effusion or pneumothorax. No acute fractures are seen. IMPRESSION: 1. No active disease. 2. Mild cardiomegaly. Electronically Signed   By: Darliss Cheney M.D.   On: 10/31/2022 19:07   DG Hip Unilat With Pelvis 2-3 Views Left  Result Date: 10/31/2022 CLINICAL DATA:  Left hip injury, pain EXAM: DG HIP (WITH OR WITHOUT PELVIS) 2-3V LEFT COMPARISON:  06/13/2022 FINDINGS: Acute, comminuted intertrochanteric fracture of the proximal left femur with varus angulation. The lateral trochanteric fracture fragment is mildly displaced medially. Hip joint alignment is maintained without dislocation. Bony pelvis intact without fracture or diastasis. Soft tissue swelling at the fracture site. IMPRESSION: Acute, comminuted intertrochanteric fracture of the proximal left femur. Electronically Signed   By: Duanne Guess D.O.   On: 10/31/2022 16:04     Assessment and Plan:   Paroxysmal atrial tachycardia  Patient with  atrial tachycardia initially noted while admitted this past May. Multiple risk factors for PAT including COPD. A subsequently worn Zio noted ongoing PAT as well as brief wide complex rhythm (felt to be aberrancy) and patient was started on Metoprolol with rates well controlled and no recurrent symptoms.   Telemetry this admission with paroxysmal atrial tachycardia, no wide complex arrhythmias. Patient denies any symptoms with transient tachycardia this admission.  Recommend continuation of home metoprolol for rate control during this admission.   Chronic Diastolic dysfunction  Patient with May 2024 echo revealing low normal LVEF of 50-55% and grade I DD. Patient without symptoms of CHF and is euvolemic on exam. No indication for repeat echocardiogram this admission.   Cardiac pre-operative evaluation and risk stratification  The patient does not have any unstable cardiac  conditions.  Upon evaluation today, she can achieve 4 METs or greater without anginal symptoms.  Her perioperative risk of major cardiac events is 0.9%. According to [facility] and AHA guidelines, she requires no further cardiac workup prior to her noncardiac surgery and should be at acceptable risk.  Our service is available as necessary in the perioperative period.   Per primary team:  Hip fracture Hx PE (Eliquis held pre surgery) COPD Hypothyroidism   Risk Assessment/Risk Scores:      For questions or updates, please contact [facility] Please consult [email or web address] for contact info under    Signed, Perlie Gold, PA-C  11/01/2022 9:31 AM   Attending note:  Patient seen and examined.  I reviewed her records and discussed the case with Mr. Latanya Maudlin, I agree with his above findings.  Preoperative cardiac assessment requested with patient pending left hip surgery for treatment of closed comminuted intertrochanteric fracture of the proximal left femur following a mechanical fall at home (no syncope).  She is currently in Buck's traction.  Cardiac history includes PAT confirmed by cardiac monitor, LVEF 50 to 55%, mild calcific aortic stenosis.  She is asymptomatic in terms of palpitations.  She has a prior history of unprovoked pulmonary embolus and is on chronic Eliquis.  She does not report any sense of palpitations with her arrhythmia.  No exertional chest pain or change in stamina, NYHA class II dyspnea.  Describes activities meeting or exceeding 4 METS and RCRI perioperative cardiac risk index indicates low risk for adverse cardiac event.  She is afebrile, heart rate in the 60s in sinus rhythm, blood pressure 135/61.  Telemetry has shown episodes of PAT during which time patient asymptomatic.  Lungs are clear.  Cardiac exam with RRR and 1-2/6 systolic murmur.  Pertinent lab work includes potassium 4.1, creatinine 0.91, hemoglobin 12.5, platelets 243, INR 1.3.  ECG shows  sinus bradycardia with nonspecific ST changes.  Chest x-ray reports no acute process with mild cardiomegaly.  Patient should be able to proceed with planned operation at relatively low perioperative cardiac risk as discussed above.  She is likely to continue to experience breakthrough PAT as has been the case before.  Would continue beta-blocker throughout and keep her on telemetry.  Eliquis on hold for now.  We will continue to follow.  Jonelle Sidle, M.D., F.A.C.C.

## 2022-11-02 ENCOUNTER — Other Ambulatory Visit: Payer: Self-pay

## 2022-11-02 ENCOUNTER — Inpatient Hospital Stay (HOSPITAL_COMMUNITY): Payer: Medicare PPO

## 2022-11-02 ENCOUNTER — Encounter (HOSPITAL_COMMUNITY)
Admission: EM | Disposition: A | Discharge status: Skilled Nursing Facility | Payer: Self-pay | Source: Home / Self Care | Attending: Internal Medicine

## 2022-11-02 ENCOUNTER — Encounter (HOSPITAL_COMMUNITY): Payer: Self-pay | Admitting: Internal Medicine

## 2022-11-02 DIAGNOSIS — S72142A Displaced intertrochanteric fracture of left femur, initial encounter for closed fracture: Secondary | ICD-10-CM | POA: Diagnosis not present

## 2022-11-02 DIAGNOSIS — I10 Essential (primary) hypertension: Secondary | ICD-10-CM | POA: Diagnosis not present

## 2022-11-02 DIAGNOSIS — E039 Hypothyroidism, unspecified: Secondary | ICD-10-CM | POA: Diagnosis not present

## 2022-11-02 DIAGNOSIS — E782 Mixed hyperlipidemia: Secondary | ICD-10-CM

## 2022-11-02 HISTORY — PX: INTRAMEDULLARY (IM) NAIL INTERTROCHANTERIC: SHX5875

## 2022-11-02 LAB — GLUCOSE, CAPILLARY
Glucose-Capillary: 115 mg/dL — ABNORMAL HIGH (ref 70–99)
Glucose-Capillary: 146 mg/dL — ABNORMAL HIGH (ref 70–99)

## 2022-11-02 LAB — BASIC METABOLIC PANEL
Anion gap: 8 (ref 5–15)
BUN: 22 mg/dL (ref 8–23)
CO2: 28 mmol/L (ref 22–32)
Calcium: 8.5 mg/dL — ABNORMAL LOW (ref 8.9–10.3)
Chloride: 102 mmol/L (ref 98–111)
Creatinine, Ser: 1.19 mg/dL — ABNORMAL HIGH (ref 0.44–1.00)
GFR, Estimated: 45 mL/min — ABNORMAL LOW (ref >60)
Glucose, Bld: 148 mg/dL — ABNORMAL HIGH (ref 70–99)
Potassium: 5 mmol/L (ref 3.5–5.1)
Sodium: 138 mmol/L (ref 135–145)

## 2022-11-02 LAB — CBC WITH DIFFERENTIAL/PLATELET
Abs Immature Granulocytes: 0.04 10*3/uL (ref 0.00–0.07)
Basophils Absolute: 0.1 10*3/uL (ref 0.0–0.1)
Basophils Relative: 1 %
Eosinophils Absolute: 0.3 10*3/uL (ref 0.0–0.5)
Eosinophils Relative: 3 %
HCT: 35.1 % — ABNORMAL LOW (ref 36.0–46.0)
Hemoglobin: 10.8 g/dL — ABNORMAL LOW (ref 12.0–15.0)
Immature Granulocytes: 0 %
Lymphocytes Relative: 15 %
Lymphs Abs: 1.7 10*3/uL (ref 0.7–4.0)
MCH: 29 pg (ref 26.0–34.0)
MCHC: 30.8 g/dL (ref 30.0–36.0)
MCV: 94.4 fL (ref 80.0–100.0)
Monocytes Absolute: 1.6 10*3/uL — ABNORMAL HIGH (ref 0.1–1.0)
Monocytes Relative: 14 %
Neutro Abs: 7.4 10*3/uL (ref 1.7–7.7)
Neutrophils Relative %: 67 %
Platelets: 211 10*3/uL (ref 150–400)
RBC: 3.72 MIL/uL — ABNORMAL LOW (ref 3.87–5.11)
RDW: 15.4 % (ref 11.5–15.5)
WBC: 11 10*3/uL — ABNORMAL HIGH (ref 4.0–10.5)
nRBC: 0 % (ref 0.0–0.2)

## 2022-11-02 LAB — MAGNESIUM: Magnesium: 2 mg/dL (ref 1.7–2.4)

## 2022-11-02 LAB — SURGICAL PCR SCREEN
MRSA, PCR: NEGATIVE
Staphylococcus aureus: NEGATIVE

## 2022-11-02 SURGERY — FIXATION, FRACTURE, INTERTROCHANTERIC, WITH INTRAMEDULLARY ROD
Anesthesia: General | Site: Hip | Laterality: Left

## 2022-11-02 MED ORDER — ONDANSETRON HCL 4 MG PO TABS: 4.0000 mg | ORAL_TABLET | Freq: Four times a day (QID) | ORAL | Status: DC | PRN

## 2022-11-02 MED ORDER — SUCCINYLCHOLINE CHLORIDE 200 MG/10ML IV SOSY
PREFILLED_SYRINGE | INTRAVENOUS | Status: AC
  Filled 2022-11-02: qty 10

## 2022-11-02 MED ORDER — SORBITOL 70 % SOLN
30.0000 mL | Freq: Every day | Status: DC | PRN
  Filled 2022-11-02: qty 30

## 2022-11-02 MED ORDER — METHOCARBAMOL 1000 MG/10ML IJ SOLN: 500.0000 mg | Freq: Four times a day (QID) | INTRAVENOUS | Status: DC | PRN

## 2022-11-02 MED ORDER — ACETAMINOPHEN 325 MG PO TABS: 325.0000 mg | ORAL_TABLET | Freq: Four times a day (QID) | ORAL | Status: DC | PRN

## 2022-11-02 MED ORDER — PROPOFOL 10 MG/ML IV BOLUS
INTRAVENOUS | Status: DC | PRN
  Administered 2022-11-02: 75 ug/kg/min via INTRAVENOUS
  Administered 2022-11-02: 100 mg via INTRAVENOUS
  Administered 2022-11-02: 50 ug via INTRAVENOUS

## 2022-11-02 MED ORDER — ROCURONIUM BROMIDE 10 MG/ML (PF) SYRINGE
PREFILLED_SYRINGE | INTRAVENOUS | Status: AC
  Filled 2022-11-02: qty 10

## 2022-11-02 MED ORDER — TRANEXAMIC ACID-NACL 1000-0.7 MG/100ML-% IV SOLN: 1000.0000 mg | Freq: Once | INTRAVENOUS | Status: DC

## 2022-11-02 MED ORDER — LIDOCAINE 2% (20 MG/ML) 5 ML SYRINGE
INTRAMUSCULAR | Status: AC
  Filled 2022-11-02: qty 5

## 2022-11-02 MED ORDER — DOCUSATE SODIUM 100 MG PO CAPS
100.0000 mg | ORAL_CAPSULE | Freq: Two times a day (BID) | ORAL | Status: DC
  Administered 2022-11-02 – 2022-11-06 (×8): 100 mg via ORAL
  Filled 2022-11-02 (×8): qty 1

## 2022-11-02 MED ORDER — LIDOCAINE 2% (20 MG/ML) 5 ML SYRINGE
INTRAMUSCULAR | Status: DC | PRN
  Administered 2022-11-02: 100 mg via INTRAVENOUS

## 2022-11-02 MED ORDER — OXYCODONE HCL 5 MG PO TABS: 5.0000 mg | ORAL_TABLET | Freq: Once | ORAL | Status: DC | PRN

## 2022-11-02 MED ORDER — ACETAMINOPHEN 10 MG/ML IV SOLN
1000.0000 mg | Freq: Once | INTRAVENOUS | Status: DC | PRN
  Administered 2022-11-02: 1000 mg via INTRAVENOUS

## 2022-11-02 MED ORDER — PROPOFOL 10 MG/ML IV BOLUS
INTRAVENOUS | Status: AC
  Filled 2022-11-02: qty 20

## 2022-11-02 MED ORDER — ROCURONIUM BROMIDE 10 MG/ML (PF) SYRINGE
PREFILLED_SYRINGE | INTRAVENOUS | Status: DC | PRN
  Administered 2022-11-02: 20 mg via INTRAVENOUS
  Administered 2022-11-02: 10 mg via INTRAVENOUS
  Administered 2022-11-02: 60 mg via INTRAVENOUS

## 2022-11-02 MED ORDER — MENTHOL 3 MG MT LOZG: 1.0000 | LOZENGE | OROMUCOSAL | Status: DC | PRN

## 2022-11-02 MED ORDER — TRANEXAMIC ACID 1000 MG/10ML IV SOLN
2000.0000 mg | Freq: Once | INTRAVENOUS | Status: AC
  Administered 2022-11-02: 2000 mg via TOPICAL
  Filled 2022-11-02: qty 20

## 2022-11-02 MED ORDER — SUGAMMADEX SODIUM 200 MG/2ML IV SOLN
INTRAVENOUS | Status: DC | PRN
  Administered 2022-11-02: 200 mg via INTRAVENOUS

## 2022-11-02 MED ORDER — ONDANSETRON HCL 4 MG/2ML IJ SOLN
INTRAMUSCULAR | Status: AC
  Filled 2022-11-02: qty 2

## 2022-11-02 MED ORDER — ONDANSETRON HCL 4 MG/2ML IJ SOLN
INTRAMUSCULAR | Status: DC | PRN
Start: 2022-11-02 — End: 2022-11-02
  Administered 2022-11-02: 4 mg via INTRAVENOUS

## 2022-11-02 MED ORDER — FENTANYL CITRATE (PF) 250 MCG/5ML IJ SOLN
INTRAMUSCULAR | Status: AC
  Filled 2022-11-02: qty 5

## 2022-11-02 MED ORDER — POVIDONE-IODINE 10 % EX SWAB
2.0000 | Freq: Once | CUTANEOUS | Status: AC
  Administered 2022-11-02: 2 via TOPICAL

## 2022-11-02 MED ORDER — MAGNESIUM CITRATE PO SOLN
1.0000 | Freq: Once | ORAL | Status: AC | PRN
  Administered 2022-11-06: 1 via ORAL
  Filled 2022-11-02 (×2): qty 296

## 2022-11-02 MED ORDER — ONDANSETRON HCL 4 MG/2ML IJ SOLN: 4.0000 mg | Freq: Once | INTRAMUSCULAR | Status: DC | PRN

## 2022-11-02 MED ORDER — 0.9 % SODIUM CHLORIDE (POUR BTL) OPTIME
TOPICAL | Status: DC | PRN
Start: 2022-11-02 — End: 2022-11-02
  Administered 2022-11-02: 1000 mL

## 2022-11-02 MED ORDER — SODIUM CHLORIDE 0.9 % IV SOLN
INTRAVENOUS | Status: DC
  Administered 2022-11-02: 990 mL via INTRAVENOUS

## 2022-11-02 MED ORDER — HYDROMORPHONE HCL 1 MG/ML IJ SOLN
0.2500 mg | INTRAMUSCULAR | Status: DC | PRN
  Administered 2022-11-02 (×4): 0.5 mg via INTRAVENOUS

## 2022-11-02 MED ORDER — OXYCODONE HCL 5 MG PO TABS: 10.0000 mg | ORAL_TABLET | ORAL | Status: DC | PRN

## 2022-11-02 MED ORDER — DROPERIDOL 2.5 MG/ML IJ SOLN: 0.6250 mg | Freq: Once | INTRAMUSCULAR | Status: DC | PRN

## 2022-11-02 MED ORDER — HYDROMORPHONE HCL 1 MG/ML IJ SOLN
0.5000 mg | INTRAMUSCULAR | Status: DC | PRN
  Administered 2022-11-03: 1 mg via INTRAVENOUS
  Administered 2022-11-03: 0.5 mg via INTRAVENOUS
  Administered 2022-11-04 – 2022-11-06 (×7): 1 mg via INTRAVENOUS
  Filled 2022-11-02 (×13): qty 1

## 2022-11-02 MED ORDER — CHLORHEXIDINE GLUCONATE 0.12 % MT SOLN: 15.0000 mL | Freq: Once | OROMUCOSAL | Status: AC

## 2022-11-02 MED ORDER — ENOXAPARIN SODIUM 40 MG/0.4ML IJ SOSY: 40.0000 mg | PREFILLED_SYRINGE | Freq: Every day | INTRAMUSCULAR | 0 refills | Status: DC

## 2022-11-02 MED ORDER — DEXAMETHASONE SODIUM PHOSPHATE 10 MG/ML IJ SOLN
INTRAMUSCULAR | Status: DC | PRN
  Administered 2022-11-02: 10 mg via INTRAVENOUS

## 2022-11-02 MED ORDER — ALUM & MAG HYDROXIDE-SIMETH 200-200-20 MG/5ML PO SUSP: 30.0000 mL | ORAL | Status: DC | PRN

## 2022-11-02 MED ORDER — ONDANSETRON HCL 4 MG/2ML IJ SOLN: 4.0000 mg | Freq: Four times a day (QID) | INTRAMUSCULAR | Status: DC | PRN

## 2022-11-02 MED ORDER — LACTATED RINGERS IV SOLN: INTRAVENOUS | Status: DC

## 2022-11-02 MED ORDER — PHENYLEPHRINE HCL-NACL 20-0.9 MG/250ML-% IV SOLN
INTRAVENOUS | Status: DC | PRN
  Administered 2022-11-02: 80 ug via INTRAVENOUS
  Administered 2022-11-02: 30 ug/min via INTRAVENOUS

## 2022-11-02 MED ORDER — ACETAMINOPHEN 500 MG PO TABS
1000.0000 mg | ORAL_TABLET | Freq: Four times a day (QID) | ORAL | Status: AC
  Administered 2022-11-02 – 2022-11-03 (×2): 1000 mg via ORAL
  Filled 2022-11-02 (×4): qty 2

## 2022-11-02 MED ORDER — PHENYLEPHRINE 80 MCG/ML (10ML) SYRINGE FOR IV PUSH (FOR BLOOD PRESSURE SUPPORT)
PREFILLED_SYRINGE | INTRAVENOUS | Status: DC | PRN
  Administered 2022-11-02: 80 ug via INTRAVENOUS

## 2022-11-02 MED ORDER — METHOCARBAMOL 500 MG PO TABS: 500.0000 mg | ORAL_TABLET | Freq: Four times a day (QID) | ORAL | Status: DC | PRN

## 2022-11-02 MED ORDER — CEFAZOLIN SODIUM-DEXTROSE 2-4 GM/100ML-% IV SOLN
INTRAVENOUS | Status: AC
  Filled 2022-11-02: qty 100

## 2022-11-02 MED ORDER — FENTANYL CITRATE (PF) 100 MCG/2ML IJ SOLN
INTRAMUSCULAR | Status: AC
  Filled 2022-11-02: qty 2

## 2022-11-02 MED ORDER — POLYETHYLENE GLYCOL 3350 17 G PO PACK
17.0000 g | PACK | Freq: Every day | ORAL | Status: DC | PRN
  Administered 2022-11-05 – 2022-11-06 (×2): 17 g via ORAL
  Filled 2022-11-02 (×2): qty 1

## 2022-11-02 MED ORDER — PHENOL 1.4 % MT LIQD: 1.0000 | OROMUCOSAL | Status: DC | PRN

## 2022-11-02 MED ORDER — ACETAMINOPHEN 10 MG/ML IV SOLN
INTRAVENOUS | Status: AC
  Filled 2022-11-02: qty 100

## 2022-11-02 MED ORDER — ORAL CARE MOUTH RINSE: 15.0000 mL | Freq: Once | OROMUCOSAL | Status: AC

## 2022-11-02 MED ORDER — CEFAZOLIN SODIUM-DEXTROSE 2-4 GM/100ML-% IV SOLN
2.0000 g | Freq: Four times a day (QID) | INTRAVENOUS | Status: AC
  Administered 2022-11-02 – 2022-11-03 (×3): 2 g via INTRAVENOUS
  Filled 2022-11-02 (×3): qty 100

## 2022-11-02 MED ORDER — CHLORHEXIDINE GLUCONATE 4 % EX SOLN: 60.0000 mL | Freq: Once | CUTANEOUS | Status: DC

## 2022-11-02 MED ORDER — FENTANYL CITRATE (PF) 100 MCG/2ML IJ SOLN
25.0000 ug | Freq: Once | INTRAMUSCULAR | Status: AC
  Administered 2022-11-02: 25 ug via INTRAVENOUS

## 2022-11-02 MED ORDER — CHLORHEXIDINE GLUCONATE 0.12 % MT SOLN
OROMUCOSAL | Status: AC
  Administered 2022-11-02: 15 mL via OROMUCOSAL
  Filled 2022-11-02: qty 15

## 2022-11-02 MED ORDER — FENTANYL CITRATE (PF) 250 MCG/5ML IJ SOLN
INTRAMUSCULAR | Status: DC | PRN
  Administered 2022-11-02 (×2): 25 ug via INTRAVENOUS

## 2022-11-02 MED ORDER — OXYCODONE HCL 5 MG PO TABS
5.0000 mg | ORAL_TABLET | Freq: Three times a day (TID) | ORAL | 0 refills | Status: DC | PRN
Start: 2022-11-02 — End: 2022-12-23

## 2022-11-02 MED ORDER — HYDROMORPHONE HCL 1 MG/ML IJ SOLN
INTRAMUSCULAR | Status: AC
  Filled 2022-11-02: qty 1

## 2022-11-02 MED ORDER — OXYCODONE HCL 5 MG PO TABS: 5.0000 mg | ORAL_TABLET | ORAL | Status: DC | PRN

## 2022-11-02 MED ORDER — OXYCODONE HCL 5 MG/5ML PO SOLN: 5.0000 mg | Freq: Once | ORAL | Status: DC | PRN

## 2022-11-02 MED ORDER — CEFAZOLIN SODIUM-DEXTROSE 2-4 GM/100ML-% IV SOLN
2.0000 g | INTRAVENOUS | Status: AC
  Administered 2022-11-02: 2 g via INTRAVENOUS

## 2022-11-02 MED ORDER — LACTATED RINGERS IV SOLN
INTRAVENOUS | Status: DC | PRN
Start: 2022-11-02 — End: 2022-11-02

## 2022-11-02 MED ORDER — DEXAMETHASONE SODIUM PHOSPHATE 10 MG/ML IJ SOLN
INTRAMUSCULAR | Status: AC
  Filled 2022-11-02: qty 1

## 2022-11-02 SURGICAL SUPPLY — 54 items
BAG COUNTER SPONGE SURGICOUNT (BAG) ×1 IMPLANT
BAG DECANTER FOR FLEXI CONT (MISCELLANEOUS) IMPLANT
BAG SPNG CNTER NS LX DISP (BAG)
BIT DRILL INTERTAN LAG SCREW (BIT) IMPLANT
BIT DRILL SHORT 4.0 (BIT) IMPLANT
BNDG CMPR 5X6 CHSV STRCH STRL (GAUZE/BANDAGES/DRESSINGS)
BNDG COHESIVE 4X5 TAN STRL (GAUZE/BANDAGES/DRESSINGS) ×1 IMPLANT
BNDG COHESIVE 6X5 TAN ST LF (GAUZE/BANDAGES/DRESSINGS) IMPLANT
BNDG GAUZE DERMACEA FLUFF 4 (GAUZE/BANDAGES/DRESSINGS) ×1 IMPLANT
BNDG GZE DERMACEA 4 6PLY (GAUZE/BANDAGES/DRESSINGS)
COVER PERINEAL POST (MISCELLANEOUS) ×1 IMPLANT
COVER SURGICAL LIGHT HANDLE (MISCELLANEOUS) ×1 IMPLANT
DRAPE C-ARMOR (DRAPES) ×1 IMPLANT
DRAPE STERI IOBAN 125X83 (DRAPES) ×1 IMPLANT
DRESSING MEPILEX FLEX 4X4 (GAUZE/BANDAGES/DRESSINGS) ×1 IMPLANT
DRILL BIT SHORT 4.0 (BIT) ×1
DRSG MEPILEX FLEX 4X4 (GAUZE/BANDAGES/DRESSINGS) ×2
DRSG MEPILEX POST OP 4X8 (GAUZE/BANDAGES/DRESSINGS) ×1 IMPLANT
DURAPREP 26ML APPLICATOR (WOUND CARE) ×1 IMPLANT
ELECT REM PT RETURN 9FT ADLT (ELECTROSURGICAL) ×1
ELECTRODE REM PT RTRN 9FT ADLT (ELECTROSURGICAL) ×1 IMPLANT
GAUZE PAD ABD 8X10 STRL (GAUZE/BANDAGES/DRESSINGS) ×2 IMPLANT
GLOVE BIOGEL PI IND STRL 7.0 (GLOVE) ×2 IMPLANT
GLOVE BIOGEL PI IND STRL 7.5 (GLOVE) ×1 IMPLANT
GLOVE ECLIPSE 7.0 STRL STRAW (GLOVE) ×1 IMPLANT
GLOVE SKINSENSE STRL SZ7.5 (GLOVE) ×2 IMPLANT
GLOVE SURG SYN 7.5 E (GLOVE) ×2 IMPLANT
GLOVE SURG SYN 7.5 PF PI (GLOVE) ×2 IMPLANT
GLOVE SURG UNDER POLY LF SZ7 (GLOVE) ×19 IMPLANT
GLOVE SURG UNDER POLY LF SZ7.5 (GLOVE) ×4 IMPLANT
GOWN STRL SURGICAL XL XLNG (GOWN DISPOSABLE) ×1 IMPLANT
GUIDE PIN 3.2X343 (PIN) ×3
GUIDE PIN 3.2X343MM (PIN) ×3
KIT BASIN OR (CUSTOM PROCEDURE TRAY) ×1 IMPLANT
KIT TURNOVER KIT B (KITS) ×1 IMPLANT
MANIFOLD NEPTUNE II (INSTRUMENTS) ×1 IMPLANT
NAIL LEFT 10X36 (Nail) IMPLANT
NS IRRIG 1000ML POUR BTL (IV SOLUTION) ×1 IMPLANT
PACK GENERAL/GYN (CUSTOM PROCEDURE TRAY) ×1 IMPLANT
PAD ARMBOARD 7.5X6 YLW CONV (MISCELLANEOUS) ×2 IMPLANT
PAD CAST 4YDX4 CTTN HI CHSV (CAST SUPPLIES) ×2 IMPLANT
PADDING CAST COTTON 4X4 STRL (CAST SUPPLIES)
PIN GUIDE 3.2X343MM (PIN) IMPLANT
SCREW LAG COMPR KIT 95/90 (Screw) IMPLANT
SCREW TRIGEN LOW PROF 5.0X37.5 (Screw) IMPLANT
STAPLER VISISTAT 35W (STAPLE) ×1 IMPLANT
SUT VIC AB 0 CT1 27 (SUTURE) ×1
SUT VIC AB 0 CT1 27XBRD ANBCTR (SUTURE) ×1 IMPLANT
SUT VIC AB 2-0 CT1 27 (SUTURE) ×3
SUT VIC AB 2-0 CT1 TAPERPNT 27 (SUTURE) ×1 IMPLANT
SYR BULB IRRIG 60ML STRL (SYRINGE) IMPLANT
TOWEL GREEN STERILE (TOWEL DISPOSABLE) ×1 IMPLANT
TOWEL GREEN STERILE FF (TOWEL DISPOSABLE) ×1 IMPLANT
WATER STERILE IRR 1000ML POUR (IV SOLUTION) ×1 IMPLANT

## 2022-11-02 NOTE — Anesthesia Preprocedure Evaluation (Addendum)
Anesthesia Evaluation  Patient identified by MRN, date of birth, ID band Patient awake    Reviewed: Allergy & Precautions, NPO status , Patient's Chart, lab work & pertinent test results  Airway Mallampati: II  TM Distance: >3 FB Neck ROM: Full    Dental no notable dental hx. (+) Teeth Intact, Dental Advisory Given   Pulmonary asthma , sleep apnea , COPD,  COPD inhaler, former smoker, PE (on eliquis)   Pulmonary exam normal breath sounds clear to auscultation       Cardiovascular hypertension, Pt. on medications and Pt. on home beta blockers Normal cardiovascular exam Rhythm:Regular Rate:Normal  06/15/2022 Echo  1. Left ventricular ejection fraction, by estimation, is 50 to 55%. The  left ventricle has low normal function. The left ventricle has no regional  wall motion abnormalities. There is mild concentric left ventricular  hypertrophy. Left ventricular  diastolic parameters are consistent with Grade I diastolic dysfunction  (impaired relaxation).   2. Right ventricular systolic function is normal. The right ventricular  size is normal. Tricuspid regurgitation signal is inadequate for assessing  PA pressure.   3. Left atrial size was mildly dilated.   4. Right atrial size was mildly dilated.   5. The mitral valve is normal in structure. Trivial mitral valve  regurgitation. No evidence of mitral stenosis.   6. The aortic valve is tricuspid. There is mild calcification of the  aortic valve. Aortic valve regurgitation is not visualized. Mild aortic  valve stenosis. Aortic valve area, by VTI measures 1.67 cm. Aortic valve  mean gradient measures 9.0 mmHg.   7. The inferior vena cava is dilated in size with <50% respiratory  variability, suggesting right atrial pressure of 15 mmHg.     Neuro/Psych    GI/Hepatic ,GERD  Medicated and Controlled,,  Endo/Other  Hypothyroidism    Renal/GU Lab Results      Component                 Value               Date                         K                        5.0                 11/02/2022                   CREATININE               1.19 (H)            11/02/2022                              Musculoskeletal   Abdominal   Peds  Hematology Lab Results      Component                Value               Date                      WBC                      11.0 (H)  11/02/2022                HGB                      10.8 (L)            11/02/2022                HCT                      35.1 (L)            11/02/2022                MCV                      94.4                11/02/2022                PLT                      211                 11/02/2022              Anesthesia Other Findings   Reproductive/Obstetrics                              Anesthesia Physical Anesthesia Plan  ASA: 3  Anesthesia Plan: General   Post-op Pain Management: Ofirmev IV (intra-op)*   Induction: Intravenous  PONV Risk Score and Plan: Treatment may vary due to age or medical condition, Ondansetron and Dexamethasone  Airway Management Planned: Oral ETT  Additional Equipment: None  Intra-op Plan:   Post-operative Plan: Extubation in OR  Informed Consent:      Dental advisory given  Plan Discussed with:   Anesthesia Plan Comments:          Anesthesia Quick Evaluation

## 2022-11-02 NOTE — Op Note (Signed)
   Date of Surgery: 11/02/2022  INDICATIONS: [name] is a 82 y.o.-year-old female who sustained a left hip fracture. The risks and benefits of the procedure discussed with the patient prior to the procedure and all questions were answered; consent was obtained.  PREOPERATIVE DIAGNOSIS: left intertrochanteric hip fracture   POSTOPERATIVE DIAGNOSIS: Same   PROCEDURE: Open treatment of intertrochanteric, pertrochanteric, subtrochanteric fracture with intramedullary implant. CPT [phone]   SURGEON: N. Glee Arvin, M.D.   ASSIST: Oneal Grout, [place]  ANESTHESIA: general   IV FLUIDS AND URINE: See anesthesia record   ESTIMATED BLOOD LOSS: 200 cc  IMPLANTS: [employer] InterTAN 10 x 36 cm, 95/90 lag screws  DRAINS: None.   COMPLICATIONS: see description of procedure.   DESCRIPTION OF PROCEDURE: The patient was brought to the operating room and placed supine on the operating table. The patient's leg had been signed prior to the procedure. The patient had the anesthesia placed by the anesthesiologist. The prep verification and incision time-outs were performed to confirm that this was the correct patient, site, side and location. The patient had an SCD on the opposite lower extremity. The patient did receive antibiotics prior to the incision and was re-dosed during the procedure as needed at indicated intervals. The patient was positioned on the fracture table with the table in traction and internal rotation to reduce the hip. The well leg was placed in a scissor position and all bony prominences were well-padded. The patient had the lower extremity prepped and draped in the standard surgical fashion. The incision was made 4 finger breadths superior to the greater trochanter. A guide pin was inserted into the tip of the greater trochanter under fluoroscopic guidance. An opening reamer was used to gain access to the femoral canal. The nail length was measured and inserted down the  femoral canal to its proper depth. The appropriate version of insertion for the lag screw was found under fluoroscopy. A pin was inserted up the femoral neck through the jig. Then, a second antirotation pin was inserted inferior to the first pin. The length of the lag screw was then measured. The lag screw was inserted as near to center-center in the head as possible. The antirotation pin was then taken out and an interdigitating compression screw was placed in its place. The leg was taken out of traction, then the interdigitating compression screw was used to compress across the fracture. Compression was visualized on serial xrays.  A distal interlocking screw was placed using the perfect circle technique.  The wound was copiously irrigated with saline and the subcutaneous layer closed with 2.0 vicryl and the skin was reapproximated with staples. The wounds were cleaned and dried a final time and a sterile dressing was placed. The hip was taken through a range of motion at the end of the case under fluoroscopic imaging to visualize the approach-withdraw phenomenon and confirm implant length in the head. The patient was then awakened from anesthesia and taken to the recovery room in stable condition. All counts were correct at the end of the case.   [name] was necessary for opening, closing, retracting, limb positioning and overall facilitation and completion of the surgery.  POSTOPERATIVE PLAN: The patient will be weight bearing as tolerated and will return in 2 weeks for staple removal and the patient will receive DVT prophylaxis based on other medications, activity level, and risk ratio of bleeding to thrombosis.   Danielle Reel, MD [facility] 2:21 PM

## 2022-11-02 NOTE — Progress Notes (Addendum)
PROGRESS NOTE    [name]  WGN:[id number] DOB: 1940-05-24 DOA: 10/31/2022 PCP: [name], NP   Brief Narrative:  82 y.o. 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  She was found to have acute comminuted intertrochanteric fracture of the proximal left femur.  Orthopedics recommended Buck's traction and admission to [facility] for surgery on Monday.  Assessment & Plan:   Acute closed comminuted intertrochanteric fracture of the proximal left femur after a mechanical fall -Continue pain management.  Fall precautions. - Orthopedics recommended Buck's traction and admission to [facility] for surgery on Monday. -N.p.o. for possible surgical intervention today.  History of PE on Eliquis -Eliquis on hold for now.  History of PAT/SVT/wide-complex tachycardia on Zio patch -Continue metoprolol.  Currently rate controlled.  Cardiology following.  Currently rate controlled. Who Essential hypertension Hyperlipidemia -Monitor blood pressure.  Continue statin, metoprolol and losartan.  Hypothyroidism -Continue levothyroxine  COPD -Currently stable.  Continue current inhaled regimen  OSA -Patient intolerant of CPAP  Obesity -Outpatient follow-up  Minimal-change disease with CKD IIIa -creatinine stable. Continue tacrolimus.  Outpatient follow-up with nephrology.  DVT prophylaxis: None for surgical intervention Code Status: Full Family Communication: None at bedside Disposition Plan: Status is: Inpatient Remains inpatient  appropriate because: Of need for surgical intervention  Consultants: Orthopedic/cardiology  Procedures: None  Antimicrobials: None   Subjective: Patient seen and examined at bedside.  Complains of intermittent left hip pain.  No shortness of breath, abdominal pain, fever or vomiting reported.  Objective: Vitals:   11/01/22 1758 11/01/22 1942 11/02/22 0532 11/02/22 0743  BP: (!) 122/49 (!) 129/51 (!) 119/46 (!) 129/58  Pulse: 69 72 69 70  Resp:  17 17   Temp:  99.4 F (37.4 C) 98.7 F (37.1 C) 99 F (37.2 C)  TempSrc:  Oral Oral Oral  SpO2:  94% 94% 94%  Weight:      Height:        Intake/Output Summary (Last 24 hours) at 11/02/2022 0807 Last data filed at 11/01/2022 1800 Gross per 24 hour  Intake --  Output 300 ml  Net -300 ml   Filed Weights   10/31/22 1415  Weight: 97.5 kg    Examination:  General: On room air.  No distress ENT/neck: No thyromegaly.  JVD is not elevated  respiratory: Decreased breath sounds at bases bilaterally with some crackles; no wheezing  CVS: S1-S2 heard, rate controlled currently Abdominal: Soft, obese, nontender, slightly distended; no organomegaly, bowel sounds are heard Extremities: Trace lower extremity edema; no cyanosis.  Left hip tenderness present. CNS: Awake and alert.  No focal neurologic deficit.  Moves extremities Lymph: No obvious lymphadenopathy Skin: No obvious ecchymosis/lesions  psych: Affect, judgment and mood are normal  musculoskeletal: No obvious joint swelling/deformity     Data Reviewed: I have personally reviewed following labs and imaging studies  CBC: Recent Labs  Lab 10/31/22 1506 11/02/22 0403  WBC 9.4 11.0*  NEUTROABS 7.7 7.4  HGB 12.5 10.8*  HCT 40.8 35.1*  MCV 92.9 94.4  PLT 243 211   Basic Metabolic Panel: Recent Labs  Lab 10/31/22 1615 11/02/22 0403  NA 138 138  K 4.1 5.0  CL 105 102  CO2 27 28  GLUCOSE 122* 148*  BUN 22 22  CREATININE 0.91 1.19*  CALCIUM 8.6* 8.5*  MG  --  2.0    GFR: Estimated Creatinine Clearance: 41.4 mL/min (A) (by C-G formula based on SCr of 1.19 mg/dL (H)). Liver Function Tests: No results for input(s): "AST", "ALT", "ALKPHOS", "BILITOT", "PROT", "ALBUMIN" in the last 168 hours. No results for input(s): "LIPASE", "AMYLASE" in the last 168 hours. No results for input(s): "AMMONIA" in the last 168 hours. Coagulation Profile: Recent Labs  Lab 10/31/22 1506  INR 1.3*   Cardiac Enzymes: No results for input(s): "CKTOTAL", "CKMB", "CKMBINDEX", "TROPONINI" in the last 168 hours. BNP (last 3 results) No results for input(s): "PROBNP" in the last 8760 hours. HbA1C: No results for input(s): "HGBA1C" in the last 72 hours. CBG: Recent Labs  Lab 10/31/22 2302 11/01/22 0736 11/01/22 1123 11/01/22 1624 11/02/22 0050  GLUCAP 117* 123* 110* 141* 146*   Lipid Profile: No results for input(s): "CHOL", "HDL", "LDLCALC", "TRIG", "CHOLHDL", "LDLDIRECT" in the last 72 hours. Thyroid Function Tests: No results for input(s): "TSH", "T4TOTAL", "FREET4", "T3FREE", "THYROIDAB" in the last 72 hours. Anemia Panel: No results for input(s): "VITAMINB12", "FOLATE", "FERRITIN", "TIBC", "IRON", "RETICCTPCT" in the last 72 hours. Sepsis Labs: No results for input(s): "PROCALCITON", "LATICACIDVEN" in the last 168 hours.  Recent Results (from the past 240 hour(s))  Surgical pcr screen     Status: None   Collection Time: 11/02/22  5:40 AM   Specimen: Nasal Mucosa; Nasal Swab  Result Value Ref Range Status   MRSA, PCR NEGATIVE NEGATIVE Final   Staphylococcus aureus NEGATIVE NEGATIVE Final    Comment: (NOTE) The Xpert SA Assay (FDA approved for NASAL specimens in patients 54 years of age and older), is one component of a comprehensive surveillance program. It is not intended to diagnose infection nor to guide or monitor treatment. Performed at [facility], [address], [place], [place] [address]          Radiology Studies: Chest Portable  1 View  Result Date: 10/31/2022 CLINICAL DATA:  Preop hip fracture EXAM: PORTABLE CHEST 1 VIEW COMPARISON:  Chest x-ray 06/19/2022 FINDINGS: The heart is mildly enlarged, unchanged. The lungs are clear. There is no pleural effusion or pneumothorax. No acute fractures are seen. IMPRESSION: 1. No active disease. 2. Mild cardiomegaly. Electronically Signed   By: Darliss Cheney M.D.   On: 10/31/2022 19:07   DG Hip Unilat With Pelvis 2-3 Views Left  Result Date: 10/31/2022 CLINICAL DATA:  Left hip injury, pain EXAM: DG HIP (WITH OR WITHOUT PELVIS) 2-3V LEFT COMPARISON:  06/13/2022 FINDINGS: Acute, comminuted intertrochanteric fracture of the proximal left femur with varus angulation. The lateral trochanteric fracture fragment is mildly displaced medially. Hip joint alignment is maintained without dislocation. Bony pelvis intact without fracture or diastasis. Soft tissue swelling at the fracture site. IMPRESSION: Acute, comminuted intertrochanteric fracture of the proximal left femur. Electronically Signed   By: Duanne Guess D.O.   On: 10/31/2022 16:04        Scheduled Meds:  citalopram  20 mg Oral QHS   feeding supplement  237 mL Oral BID BM   fluticasone furoate-vilanterol  1 puff Inhalation Daily   And   umeclidinium bromide  1 puff Inhalation Daily   levothyroxine  88 mcg Oral Q0600   losartan  100 mg Oral Daily   metoprolol tartrate  25 mg Oral BID  pantoprazole  40 mg Oral Daily   rOPINIRole  1 mg Oral QHS   rosuvastatin  20 mg Oral Daily   Continuous Infusions:  methocarbamol (ROBAXIN) IV            Glade Lloyd, MD [facility] 11/02/2022, 8:07 AM

## 2022-11-02 NOTE — Plan of Care (Signed)
  Problem: Education: Goal: Knowledge of General Education information will improve Description: Including pain rating scale, medication(s)/side effects and non-pharmacologic comfort measures Outcome: Progressing   Problem: Health Behavior/Discharge Planning: Goal: Ability to manage health-related needs will improve Outcome: Progressing   Problem: Clinical Measurements: Goal: Ability to maintain clinical measurements within normal limits will improve Outcome: Progressing Goal: Will remain free from infection Outcome: Progressing Goal: Diagnostic test results will improve Outcome: Progressing Goal: Respiratory complications will improve Outcome: Progressing Goal: Cardiovascular complication will be avoided Outcome: Progressing   Problem: Nutrition: Goal: Adequate nutrition will be maintained Outcome: Progressing   Problem: Coping: Goal: Level of anxiety will decrease Outcome: Progressing   Problem: Skin Integrity: Goal: Risk for impaired skin integrity will decrease Outcome: Progressing

## 2022-11-02 NOTE — Anesthesia Procedure Notes (Signed)
Procedure Name: Intubation Date/Time: 11/02/2022 1:36 PM  Performed by: Pincus Large, CRNAPre-anesthesia Checklist: Patient identified, Emergency Drugs available, Suction available and Patient being monitored Patient Re-evaluated:Patient Re-evaluated prior to induction Oxygen Delivery Method: Circle System Utilized Preoxygenation: Pre-oxygenation with 100% oxygen Induction Type: IV induction Ventilation: Mask ventilation without difficulty Laryngoscope Size: Mac and 4 (Attempted with Miller 2, successful intubation with MAC 4) Grade View: Grade II Tube type: Oral Tube size: 7.0 mm Number of attempts: 3 (CRNA, MDA, CRNA) Airway Equipment and Method: Stylet and Oral airway Placement Confirmation: ETT inserted through vocal cords under direct vision, positive ETCO2 and breath sounds checked- equal and bilateral Tube secured with: Tape Dental Injury: Teeth and Oropharynx as per pre-operative assessment  Difficulty Due To: Difficult Airway- due to limited oral opening and Difficult Airway- due to reduced neck mobility

## 2022-11-02 NOTE — Anesthesia Postprocedure Evaluation (Signed)
Anesthesia Post Note  Patient: [name]  Procedure(s) Performed: INTRAMEDULLARY NAILING OF LEFT FEMUR (Left: Hip)     Patient location during evaluation: PACU Anesthesia Type: General Level of consciousness: awake and alert Pain management: pain level controlled Vital Signs Assessment: post-procedure vital signs reviewed and stable Respiratory status: spontaneous breathing, nonlabored ventilation, respiratory function stable and patient connected to nasal cannula oxygen Cardiovascular status: blood pressure returned to baseline and stable Postop Assessment: no apparent nausea or vomiting Anesthetic complications: no   No notable events documented.  Last Vitals:  Vitals:   11/02/22 1545 11/02/22 1600  BP: 122/65 (!) 152/66  Pulse: 72 70  Resp: 14 16  Temp: 36.6 C 36.4 C  SpO2: 96% 96%    Last Pain:  Vitals:   11/02/22 1600  TempSrc: Oral  PainSc:                  Trevor Iha

## 2022-11-02 NOTE — TOC CAGE-AID Note (Signed)
Transition of Care [facility]) - CAGE-AID Screening   Patient Details  Name: [name] MRN: [id number] Date of Birth: Jul 19, 1940  [facility]) CM/SW Contact:    Katha Hamming, RN Phone Number: 11/02/2022, 5:09 AM   Clinical Narrative:  Chart review with occasional alcohol use. No resources indicated  CAGE-AID Screening:    Have You Ever Felt You Ought to Cut Down on Your Drinking or Drug Use?: No Have People Annoyed You By Critizing Your Drinking Or Drug Use?: No Have You Felt Bad Or Guilty About Your Drinking Or Drug Use?: No Have You Ever Had a Drink or Used Drugs First Thing In The Morning to Steady Your Nerves or to Get Rid of a Hangover?: No CAGE-AID Score: 0  Substance Abuse Education Offered: No

## 2022-11-02 NOTE — H&P (Signed)

## 2022-11-02 NOTE — Progress Notes (Signed)
Report given to short stay, consent signed, pt ready for transport.

## 2022-11-02 NOTE — Transfer of Care (Signed)
Immediate Anesthesia Transfer of Care Note  Patient: [name]  Procedure(s) Performed: INTRAMEDULLARY NAILING OF LEFT FEMUR (Left: Hip)  Patient Location: PACU  Anesthesia Type:General  Level of Consciousness: awake, alert , and oriented  Airway & Oxygen Therapy: Patient Spontanous Breathing and Patient connected to face mask oxygen  Post-op Assessment: Report given to RN and Post -op Vital signs reviewed and stable  Post vital signs: Reviewed and stable  Last Vitals:  Vitals Value Taken Time  BP 138/71 11/02/22 1454  Temp 36.6 C 11/02/22 1454  Pulse 65 11/02/22 1456  Resp 17 11/02/22 1456  SpO2 77 % 11/02/22 1456  Vitals shown include unfiled device data.  Last Pain:  Vitals:   11/02/22 1220  TempSrc:   PainSc: 4       Patients Stated Pain Goal: 2 (11/02/22 1220)  Complications: No notable events documented.

## 2022-11-02 NOTE — Consult Note (Signed)
Reason for Consult:Left hip fx Referring Physician: Glade Lloyd Time called: 0730 Time at bedside: 0853   [name] is an 82 y.o. female.  HPI: [name] was in her garage and tripped and fell. She had immediate left hip pain and could not get up. She was brought to the ED where x-rays showed a left hip fx and orthopedic surgery was consulted. She lives at home with her daughter and does not use any assistive devices to ambulate.  Past Medical History:  Diagnosis Date   Actinic keratosis    Arthritis    Asthma    Back injury 2019   Fracture   Basal cell carcinoma    COPD (chronic obstructive pulmonary disease) (HCC)    GERD (gastroesophageal reflux disease)    H/O blood clots 2020   Lung, Per [facility] New Patient Packet   H/O mammogram 2022   Per [facility] New Patient Packet   Heart murmur    hx of in childhood    High cholesterol    Hyperlipidemia    Hypertension    Hypothyroidism    Kidney disease    Per [facility] New Patient Packet   MCNS (minimal change nephrotic syndrome)    OSA (obstructive sleep apnea) 08/29/2019   Osteopenia    Shortness of breath dyspnea    on exertion    Squamous cell carcinoma    Thyroid disease    Per [facility] New Patient Packet    Past Surgical History:  Procedure Laterality Date   ABDOMINAL HYSTERECTOMY     APPENDECTOMY     BILATERAL SALPINGOOPHORECTOMY     Ovarian Cysts    CHOLECYSTECTOMY N/A 06/17/2022   Procedure: LAPAROSCOPIC CHOLECYSTECTOMY;  Surgeon: Fritzi Mandes, MD;  Location: [facility];  Service: General;  Laterality: N/A;   COLONOSCOPY  2001   Dr.Mann, Per [facility] New Patient Packet   CONVERSION TO TOTAL KNEE Right 07/10/2014   Procedure: RIGHT CONVERSION OF PARTIAL KNEE TO TOTAL KNEE;  Surgeon: [name], MD;  Location: [facility];  Service: Orthopedics;  Laterality: Right;   ENDOSCOPIC RETROGRADE CHOLANGIOPANCREATOGRAPHY (ERCP) WITH PROPOFOL N/A 06/15/2022   Procedure: ENDOSCOPIC RETROGRADE CHOLANGIOPANCREATOGRAPHY (ERCP) WITH PROPOFOL;   Surgeon: Vida Rigger, MD;  Location: [facility];  Service: Gastroenterology;  Laterality: N/A;   IR ANGIOGRAM PULMONARY BILATERAL SELECTIVE  02/08/2019   IR ANGIOGRAM SELECTIVE EACH ADDITIONAL VESSEL  02/08/2019   IR ANGIOGRAM SELECTIVE EACH ADDITIONAL VESSEL  02/08/2019   IR INFUSION THROMBOL ARTERIAL INITIAL (MS)  02/08/2019   IR INFUSION THROMBOL ARTERIAL INITIAL (MS)  02/08/2019   IR THROMB F/U EVAL ART/VEN FINAL DAY (MS)  02/09/2019   IR US GUIDE VASC ACCESS RIGHT  02/08/2019   MEDIAL PARTIAL KNEE REPLACEMENT Bilateral    MOHS SURGERY     x 2   REMOVAL OF STONES  06/15/2022   Procedure: REMOVAL OF STONES;  Surgeon: Vida Rigger, MD;  Location: [facility];  Service: Gastroenterology;;   RENAL BIOPSY     x 2   REPLACEMENT TOTAL KNEE  2012   Per [facility] New Patient Packet   ROTATOR CUFF REPAIR Left 2008   SPHINCTEROTOMY  06/15/2022   Procedure: SPHINCTEROTOMY;  Surgeon: Vida Rigger, MD;  Location: [facility];  Service: Gastroenterology;;    Family History  Problem Relation Age of Onset   COPD Mother        Husband Smoked    Lymphoma Father    Hypertension Sister    Scoliosis Daughter    Stroke Maternal Grandfather  Rheumatic fever Paternal Grandmother    Heart disease Paternal Grandfather     Social History:  reports that she quit smoking about 49 years ago. Her smoking use included cigarettes. She started smoking about 74 years ago. She has a 37.5 pack-year smoking history. She has never used smokeless tobacco. She reports current alcohol use of about 0.5 standard drinks of alcohol per week. She reports that she does not use drugs.  Allergies: No Known Allergies  Medications: I have reviewed the patient's current medications.  Results for orders placed or performed during the hospital encounter of 10/31/22 (from the past 48 hour(s))  CBC with Differential     Status: None   Collection Time: 10/31/22  3:06 PM  Result Value Ref Range   WBC 9.4 4.0 - 10.5 K/uL   RBC 4.39  3.87 - 5.11 MIL/uL   Hemoglobin 12.5 12.0 - 15.0 g/dL   HCT 95.6 21.3 - 08.6 %   MCV 92.9 80.0 - 100.0 fL   MCH 28.5 26.0 - 34.0 pg   MCHC 30.6 30.0 - 36.0 g/dL   RDW 57.8 46.9 - 62.9 %   Platelets 243 150 - 400 K/uL   nRBC 0.0 0.0 - 0.2 %   Neutrophils Relative % 81 %   Neutro Abs 7.7 1.7 - 7.7 K/uL   Lymphocytes Relative 9 %   Lymphs Abs 0.9 0.7 - 4.0 K/uL   Monocytes Relative 8 %   Monocytes Absolute 0.7 0.1 - 1.0 K/uL   Eosinophils Relative 0 %   Eosinophils Absolute 0.0 0.0 - 0.5 K/uL   Basophils Relative 1 %   Basophils Absolute 0.1 0.0 - 0.1 K/uL   Immature Granulocytes 1 %   Abs Immature Granulocytes 0.05 0.00 - 0.07 K/uL    Comment: Performed at [facility], [address], [place], [place] [address]  Protime-INR     Status: Abnormal   Collection Time: 10/31/22  3:06 PM  Result Value Ref Range   Prothrombin Time 16.0 (H) 11.4 - 15.2 seconds   INR 1.3 (H) 0.8 - 1.2    Comment: (NOTE) INR goal varies based on device and disease states. Performed at [facility], [address] [place], [place] [address]   Type and screen [facility]     Status: None   Collection Time: 10/31/22  3:06 PM  Result Value Ref Range   ABO/RH(D) A POS    Antibody Screen NEG    Sample Expiration      11/03/2022,2359 Performed at [facility], [address], [place], [place] [address]   Basic metabolic panel     Status: Abnormal   Collection Time: 10/31/22  4:15 PM  Result Value Ref Range   Sodium 138 135 - 145 mmol/L   Potassium 4.1 3.5 - 5.1 mmol/L   Chloride 105 98 - 111 mmol/L   CO2 27 22 - 32 mmol/L   Glucose, Bld 122 (H) 70 - 99 mg/dL    Comment: Glucose reference range applies only to samples taken after fasting for at least 8 hours.   BUN 22 8 - 23 mg/dL   Creatinine, Ser 5.36 0.44 - 1.00 mg/dL   Calcium 8.6 (L) 8.9 - 10.3 mg/dL   GFR, Estimated >64 >40 mL/min    Comment: (NOTE) Calculated using  the CKD-EPI Creatinine Equation (2021)    Anion gap 6 5 - 15    Comment: Performed at [facility], [address] [place], [place] [address]  Glucose, capillary     Status: Abnormal   Collection Time: 10/31/22 11:02 PM  Result Value Ref Range   Glucose-Capillary 117 (H) 70 - 99 mg/dL    Comment: Glucose reference range applies only to samples taken after fasting for at least 8 hours.  Glucose, capillary     Status: Abnormal   Collection Time: 11/01/22  7:36 AM  Result Value Ref Range   Glucose-Capillary 123 (H) 70 - 99 mg/dL    Comment: Glucose reference range applies only to samples taken after fasting for at least 8 hours.  Glucose, capillary     Status: Abnormal   Collection Time: 11/01/22 11:23 AM  Result Value Ref Range   Glucose-Capillary 110 (H) 70 - 99 mg/dL    Comment: Glucose reference range applies only to samples taken after fasting for at least 8 hours.  Glucose, capillary     Status: Abnormal   Collection Time: 11/01/22  4:24 PM  Result Value Ref Range   Glucose-Capillary 141 (H) 70 - 99 mg/dL    Comment: Glucose reference range applies only to samples taken after fasting for at least 8 hours.  Glucose, capillary     Status: Abnormal   Collection Time: 11/02/22 12:50 AM  Result Value Ref Range   Glucose-Capillary 146 (H) 70 - 99 mg/dL    Comment: Glucose reference range applies only to samples taken after fasting for at least 8 hours.  CBC with Differential/Platelet     Status: Abnormal   Collection Time: 11/02/22  4:03 AM  Result Value Ref Range   WBC 11.0 (H) 4.0 - 10.5 K/uL   RBC 3.72 (L) 3.87 - 5.11 MIL/uL   Hemoglobin 10.8 (L) 12.0 - 15.0 g/dL   HCT 78.2 (L) 95.6 - 21.3 %   MCV 94.4 80.0 - 100.0 fL   MCH 29.0 26.0 - 34.0 pg   MCHC 30.8 30.0 - 36.0 g/dL   RDW 08.6 57.8 - 46.9 %   Platelets 211 150 - 400 K/uL   nRBC 0.0 0.0 - 0.2 %   Neutrophils Relative % 67 %   Neutro Abs 7.4 1.7 - 7.7 K/uL   Lymphocytes Relative 15 %   Lymphs Abs  1.7 0.7 - 4.0 K/uL   Monocytes Relative 14 %   Monocytes Absolute 1.6 (H) 0.1 - 1.0 K/uL   Eosinophils Relative 3 %   Eosinophils Absolute 0.3 0.0 - 0.5 K/uL   Basophils Relative 1 %   Basophils Absolute 0.1 0.0 - 0.1 K/uL   Immature Granulocytes 0 %   Abs Immature Granulocytes 0.04 0.00 - 0.07 K/uL    Comment: Performed at [facility], [address], [place], [place] [address]  Basic metabolic panel     Status: Abnormal   Collection Time: 11/02/22  4:03 AM  Result Value Ref Range   Sodium 138 135 - 145 mmol/L   Potassium 5.0 3.5 - 5.1 mmol/L   Chloride 102 98 - 111 mmol/L   CO2 28 22 - 32 mmol/L   Glucose, Bld 148 (H) 70 - 99 mg/dL    Comment: Glucose reference range applies only to samples taken after fasting for at least 8 hours.   BUN 22 8 - 23 mg/dL   Creatinine, Ser 8.41 (H) 0.44 - 1.00 mg/dL   Calcium 8.5 (L) 8.9 - 10.3 mg/dL   GFR, Estimated 45 (L) >60 mL/min    Comment: (NOTE) Calculated using the CKD-EPI Creatinine Equation (  2021)    Anion gap 8 5 - 15    Comment: Performed at [facility], [address], [place], [place] [address]  Magnesium     Status: None   Collection Time: 11/02/22  4:03 AM  Result Value Ref Range   Magnesium 2.0 1.7 - 2.4 mg/dL    Comment: Performed at [facility], [address], [place], [place] [address]  Surgical pcr screen     Status: None   Collection Time: 11/02/22  5:40 AM   Specimen: Nasal Mucosa; Nasal Swab  Result Value Ref Range   MRSA, PCR NEGATIVE NEGATIVE   Staphylococcus aureus NEGATIVE NEGATIVE    Comment: (NOTE) The Xpert SA Assay (FDA approved for NASAL specimens in patients 26 years of age and older), is one component of a comprehensive surveillance program. It is not intended to diagnose infection nor to guide or monitor treatment. Performed at [facility], [address], [place], [place] [address]     Chest Portable 1 View  Result Date: 10/31/2022 CLINICAL DATA:  Preop hip fracture  EXAM: PORTABLE CHEST 1 VIEW COMPARISON:  Chest x-ray 06/19/2022 FINDINGS: The heart is mildly enlarged, unchanged. The lungs are clear. There is no pleural effusion or pneumothorax. No acute fractures are seen. IMPRESSION: 1. No active disease. 2. Mild cardiomegaly. Electronically Signed   By: Darliss Cheney M.D.   On: 10/31/2022 19:07   DG Hip Unilat With Pelvis 2-3 Views Left  Result Date: 10/31/2022 CLINICAL DATA:  Left hip injury, pain EXAM: DG HIP (WITH OR WITHOUT PELVIS) 2-3V LEFT COMPARISON:  06/13/2022 FINDINGS: Acute, comminuted intertrochanteric fracture of the proximal left femur with varus angulation. The lateral trochanteric fracture fragment is mildly displaced medially. Hip joint alignment is maintained without dislocation. Bony pelvis intact without fracture or diastasis. Soft tissue swelling at the fracture site. IMPRESSION: Acute, comminuted intertrochanteric fracture of the proximal left femur. Electronically Signed   By: Duanne Guess D.O.   On: 10/31/2022 16:04    Review of Systems  HENT:  Negative for ear discharge, ear pain, hearing loss and tinnitus.   Eyes:  Negative for photophobia and pain.  Respiratory:  Negative for cough and shortness of breath.   Cardiovascular:  Negative for chest pain.  Gastrointestinal:  Negative for abdominal pain, nausea and vomiting.  Genitourinary:  Negative for dysuria, flank pain, frequency and urgency.  Musculoskeletal:  Positive for arthralgias (Left hip). Negative for back pain, myalgias and neck pain.  Neurological:  Negative for dizziness and headaches.  Hematological:  Does not bruise/bleed easily.  Psychiatric/Behavioral:  The patient is not nervous/anxious.    Blood pressure (!) 129/58, pulse 70, temperature 99 F (37.2 C), temperature source Oral, resp. rate 17, height 5\' 5"  (1.651 m), weight 97.5 kg, SpO2 95%. Physical Exam Constitutional:      General: She is not in acute distress.    Appearance: She is well-developed. She  is not diaphoretic.  HENT:     Head: Normocephalic and atraumatic.  Eyes:     General: No scleral icterus.       Right eye: No discharge.        Left eye: No discharge.     Conjunctiva/sclera: Conjunctivae normal.  Cardiovascular:     Rate and Rhythm: Normal rate and regular rhythm.  Pulmonary:     Effort: Pulmonary effort is normal. No respiratory distress.  Musculoskeletal:     Cervical back: Normal range of motion.  Comments: LLE No traumatic wounds, ecchymosis, or rash  Mod TTP hip, in [place]  No knee or ankle effusion  Knee stable to varus/ valgus and anterior/posterior stress  Sens DPN, SPN, TN intact  Motor EHL 5/5  DP 2+, No significant edema  Skin:    General: Skin is warm and dry.  Neurological:     Mental Status: She is alert.  Psychiatric:        Mood and Affect: Mood normal.        Behavior: Behavior normal.     Assessment/Plan: Left hip fx -- Plan IMN today with Dr. Roda Shutters. Please keep NPO. 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

## 2022-11-02 NOTE — Progress Notes (Signed)
Note occasional episodes of nonsustained atrial tachycardia. No change in planned CV treatment. Make sur the beta blockers are not inadvertently interrupted. Will continue to monitor the rhythm.

## 2022-11-02 NOTE — Discharge Instructions (Signed)
1. Change dressings as needed 2. May shower but keep incisions covered and dry 3. Take eliquis to prevent blood clots 4. Take stool softeners as needed 5. Take pain meds as needed

## 2022-11-03 ENCOUNTER — Encounter (HOSPITAL_COMMUNITY): Payer: Self-pay | Admitting: Orthopaedic Surgery

## 2022-11-03 DIAGNOSIS — S72142A Displaced intertrochanteric fracture of left femur, initial encounter for closed fracture: Secondary | ICD-10-CM | POA: Diagnosis not present

## 2022-11-03 LAB — CBC WITH DIFFERENTIAL/PLATELET
Abs Immature Granulocytes: 0.07 10*3/uL (ref 0.00–0.07)
Basophils Absolute: 0 10*3/uL (ref 0.0–0.1)
Basophils Relative: 0 %
Eosinophils Absolute: 0 10*3/uL (ref 0.0–0.5)
Eosinophils Relative: 0 %
HCT: 30.2 % — ABNORMAL LOW (ref 36.0–46.0)
Hemoglobin: 9.3 g/dL — ABNORMAL LOW (ref 12.0–15.0)
Immature Granulocytes: 1 %
Lymphocytes Relative: 7 %
Lymphs Abs: 0.9 10*3/uL (ref 0.7–4.0)
MCH: 28.7 pg (ref 26.0–34.0)
MCHC: 30.8 g/dL (ref 30.0–36.0)
MCV: 93.2 fL (ref 80.0–100.0)
Monocytes Absolute: 1.5 10*3/uL — ABNORMAL HIGH (ref 0.1–1.0)
Monocytes Relative: 13 %
Neutro Abs: 9.1 10*3/uL — ABNORMAL HIGH (ref 1.7–7.7)
Neutrophils Relative %: 79 %
Platelets: 194 10*3/uL (ref 150–400)
RBC: 3.24 MIL/uL — ABNORMAL LOW (ref 3.87–5.11)
RDW: 15.1 % (ref 11.5–15.5)
WBC: 11.5 10*3/uL — ABNORMAL HIGH (ref 4.0–10.5)
nRBC: 0 % (ref 0.0–0.2)

## 2022-11-03 LAB — BASIC METABOLIC PANEL
Anion gap: 6 (ref 5–15)
BUN: 32 mg/dL — ABNORMAL HIGH (ref 8–23)
CO2: 26 mmol/L (ref 22–32)
Calcium: 7.9 mg/dL — ABNORMAL LOW (ref 8.9–10.3)
Chloride: 101 mmol/L (ref 98–111)
Creatinine, Ser: 1.21 mg/dL — ABNORMAL HIGH (ref 0.44–1.00)
GFR, Estimated: 44 mL/min — ABNORMAL LOW (ref >60)
Glucose, Bld: 175 mg/dL — ABNORMAL HIGH (ref 70–99)
Potassium: 4.7 mmol/L (ref 3.5–5.1)
Sodium: 133 mmol/L — ABNORMAL LOW (ref 135–145)

## 2022-11-03 LAB — MAGNESIUM: Magnesium: 1.9 mg/dL (ref 1.7–2.4)

## 2022-11-03 MED ORDER — TACROLIMUS 1 MG PO CAPS
1.0000 mg | ORAL_CAPSULE | Freq: Two times a day (BID) | ORAL | Status: DC
  Administered 2022-11-03 – 2022-11-06 (×7): 1 mg via ORAL
  Filled 2022-11-03 (×7): qty 1

## 2022-11-03 MED ORDER — APIXABAN 5 MG PO TABS
5.0000 mg | ORAL_TABLET | Freq: Two times a day (BID) | ORAL | Status: DC
  Administered 2022-11-03 – 2022-11-06 (×7): 5 mg via ORAL
  Filled 2022-11-03 (×7): qty 1

## 2022-11-03 NOTE — Plan of Care (Signed)

## 2022-11-03 NOTE — Care Management Important Message (Signed)
Important Message  Patient Details  Name: Dashawna Kirchberg MRN: [id number] Date of Birth: 09-20-1940   Important Message Given:  Yes - Medicare IM     Sherilyn Banker 11/03/2022, 3:10 PM

## 2022-11-03 NOTE — Evaluation (Signed)
Physical Therapy Evaluation Patient Details Name: [name] MRN: [id number] DOB: 1940/03/25 Today's Date: 11/03/2022  History of Present Illness  82 y.o. female presented with fall and left hip pain. She was found to have acute comminuted intertrochanteric fracture of the proximal left femur. 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 Lives at home with her daughter, in a 2-level home (stays on 1st floor) with 2 steps to enter; Prior to admission, pt was fully independent, enjoys going out, active with her church; Presents to PT with a significant functional decline from baseline, with L hip pain limiting movement and activity tolerance, decr AROM and strength;  Needed 2 person total assist for all aspects of bed mobility; Able to sit EOB for approx 20 minutes, initiallly needing Mod assist, but progressing to Supervision; Attempted sit to stand at EOB with +2 total assist and RW, however unable to rise; Will need post-acute rehab to maximize independence and safety with mobility and ADLs and allow for dc home; Pt currently with functional limitations due to the deficits listed below (see PT Problem List). Pt will benefit from skilled PT to increase their independence and safety with mobility to allow discharge to the venue listed below.           If plan is discharge home, recommend the following: Two people to help with walking and/or transfers;Two people to help with bathing/dressing/bathroom   Can travel by private vehicle    No    Equipment Recommendations Rolling walker (2 wheels);BSC/3in1;Wheelchair (measurements PT);Wheelchair cushion (measurements PT);Hospital bed  Recommendations for Other Services  Other (comment) (Mobility Specialists for assist with safe transfers)    Functional Status Assessment Patient has had a recent decline in their functional status and demonstrates the ability to make significant improvements in function in a reasonable and predictable amount of time.     Precautions / Restrictions Precautions Precautions: Fall Restrictions Weight Bearing Restrictions: No LLE Weight Bearing: Weight bearing as tolerated      Mobility  Bed Mobility Overal bed mobility: Needs Assistance Bed Mobility: Supine to Sit, Sit to Supine     Supine to sit: +2 for physical assistance, Total assist Sit to supine: +2 for physical assistance, Total assist   General bed mobility comments: Used bed pad and 2 person assist for getting to EOB, and then back to bed, helicopter method; once back in bed, positioned bed in semi-chair position    Transfers Overall transfer level: Needs assistance Equipment used: Rolling walker (2 wheels) Transfers: Sit to/from Stand Sit to Stand: +2 physical assistance, Total assist           General transfer comment: Attempted to stand at bedside with 2 person total assist; Able to clear hips with 2 person heavy assist and RW (changed bed pads    Ambulation/Gait                  Stairs            Wheelchair Mobility     Tilt Bed    Modified Rankin (Stroke Patients Only)  Balance Overall balance assessment: Needs assistance   Sitting balance-Leahy Scale: Poor       Standing balance-Leahy Scale: Zero                               Pertinent Vitals/Pain Pain Assessment Pain Assessment: Faces Faces Pain Scale: Hurts worst Pain Location: left hip Pain Descriptors / Indicators: Aching Pain Intervention(s):  Monitored during session, Limited activity within patient's tolerance    Home Living Family/patient expects to be discharged to:: Private residence Living Arrangements: Children (lives with daughter) Available Help at Discharge: Available PRN/intermittently Type of Home: House Home Access: Stairs to enter Entrance Stairs-Rails: Doctor, general practice of Steps: 2   Home Layout: Two level;Able to live on main level with bedroom/bathroom Home Equipment: Rolling Walker (2 wheels);Cane - single point;BSC/3in1;Shower seat      Prior Function Prior Level of Function : Independent/Modified Independent;Driving             Mobility Comments: pt reports ind with amb without AD ADLs Comments: pt reports ind with self care, light household chores and cooking     Extremity/Trunk Assessment   Upper Extremity Assessment Upper Extremity Assessment: Defer to OT evaluation    Lower Extremity Assessment Lower Extremity Assessment: LLE deficits/detail LLE Deficits / Details: Exquisite pain L hip with any movement LLE: Unable to fully assess due to pain       Communication   Communication Communication: No apparent difficulties  Cognition Arousal: Alert Behavior During Therapy: WFL for tasks assessed/performed, Anxious (anxious with anticipation of pain) Overall Cognitive Status: Within Functional Limits for tasks assessed                                          General Comments General comments (skin integrity, edema, etc.): Session conducted on Room Air; O2 sats ranged 89%-91% throughout session; opted to re-start supplemental O2 1L;     11/03/22 1100 11/03/22 1105 11/03/22 1110  Orthostatic Lying   BP- Lying 98/57 (MAP 67)  --  127/84 (MAP 84)  Pulse- Lying 69  --  74  Orthostatic Sitting  BP- Sitting 142/77 (MAP 94) 129/77 (MAP 90)  --   Pulse- Sitting 91 73  --        Exercises     Assessment/Plan    PT Assessment Patient needs  continued PT services  PT Problem List Decreased strength;Decreased range of motion;Decreased activity tolerance;Decreased balance;Decreased mobility;Decreased knowledge of use of DME;Decreased knowledge of precautions;Cardiopulmonary status limiting activity;Pain       PT Treatment Interventions DME instruction;Gait training;Functional mobility training;Therapeutic activities;Therapeutic exercise;Balance training;Patient/family education;Wheelchair mobility training    PT Goals (Current goals can be found in the Care Plan section)  Acute Rehab PT Goals Patient Stated Goal: Back to independence PT Goal Formulation: With patient Time For Goal Achievement: 11/17/22 Potential to Achieve Goals: Good    Frequency Min 1X/week     Co-evaluation               AM-PAC PT "6 Clicks" Mobility  Outcome Measure Help needed turning from your back to your side while in a flat bed without using bedrails?: Total Help needed moving from lying on your back to sitting on the side of a flat bed without using bedrails?: Total Help needed moving to and from a bed to a chair (including  a wheelchair)?: Total Help needed standing up from a chair using your arms (e.g., wheelchair or bedside chair)?: Total Help needed to walk in hospital room?: Total Help needed climbing 3-5 steps with a railing? : Total 6 Click Score: 6    End of Session Equipment Utilized During Treatment: Other (comment) (bed pads, monitor) Activity Tolerance: Patient limited by pain Patient left: in bed;with call bell/phone within reach;Other (comment) (bed in semi-chair position) Nurse Communication: Mobility status;Other (comment);Patient requests pain meds (Pain level during session, BPs up) PT Visit Diagnosis: Pain;Other abnormalities of gait and mobility (R26.89);Muscle weakness (generalized) (M62.81);Unsteadiness on feet (R26.81) Pain - Right/Left: Left Pain - part of body: Hip    Time: 1610-9604 PT Time Calculation (min)  (ACUTE ONLY): 58 min   Charges:   PT Evaluation $PT Eval Moderate Complexity: 1 Mod PT Treatments $Therapeutic Activity: 38-52 mins PT General Charges $$ ACUTE PT VISIT: 1 Visit         Van Clines, PT  Acute Rehabilitation Services Office [phone] Secure Chat welcomed   Levi Aland 11/03/2022, 12:50 PM

## 2022-11-03 NOTE — [place] FL2 (Signed)
[place] MEDICAID FL2 LEVEL OF CARE FORM     IDENTIFICATION  Patient Name: [name] Birthdate: 08/20/1940 Sex: female Admission Date (Current Location): 10/31/2022  [facility] and [place] Number:  Producer, television/film/video and Address:  [facility] [place], [address], [place], [place] [address]      Provider Number: [id number]  Attending Physician Name and Address:  Glade Lloyd, MD  Relative Name and Phone Number:  Weyman Pedro Daughter [phone]    Current Level of Care: Hospital Recommended Level of Care: Skilled Nursing Facility Prior Approval Number:    Date Approved/Denied:   PASRR Number: [id number]  Discharge Plan: SNF    Current Diagnoses: Patient Active Problem List   Diagnosis Date Noted   Closed comminuted intertrochanteric fracture of proximal femur, left, initial encounter (HCC) 10/31/2022   Accidental fall 10/31/2022   Chronic anticoagulation 10/31/2022   History of pulmonary embolism 10/31/2022   History of PAT (paroxysmal atrial tachycardia) 10/31/2022   Closed left hip fracture, initial encounter (HCC) 10/31/2022   Acute pancreatitis 06/13/2022   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/26/2022   Acquired hypercoagulable state (HCC) 01/26/2022   Bilateral hearing loss 11/04/2020   Diabetes mellitus without complication (HCC) 11/04/2020   RLS (restless legs syndrome) 11/04/2020   OSA (obstructive sleep apnea) 08/29/2019   Abnormal findings on diagnostic imaging of lung 08/29/2019   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   Pulmonary embolism (HCC) 02/07/2019   GERD without esophagitis 05/15/2017   Dyslipidemia 05/15/2017   Anxiety 05/15/2017   Constipation due to opioid therapy 05/15/2017   Accidental fall from chair, initial encounter 05/08/2017   Closed posttraumatic compression fracture of lumbar vertebra (HCC) 05/08/2017   Leukocytosis 05/08/2017   S/P right TKA 07/10/2014   S/P knee replacement  07/10/2014   COPD (chronic obstructive pulmonary disease) (HCC) 09/24/2013   Hypothyroidism 07/24/2012   Essential hypertension, benign 07/24/2012   Allergic rhinitis 07/24/2012   Mixed hyperlipidemia 07/24/2012    Orientation RESPIRATION BLADDER Height & Weight     Self, Time, Situation, Place  O2 Continent Weight: 210 lb 1.6 oz (95.3 kg) Height:  5\' 5"  (165.1 cm)  BEHAVIORAL SYMPTOMS/MOOD NEUROLOGICAL BOWEL NUTRITION STATUS      Continent Diet (see discharge summary)  AMBULATORY STATUS COMMUNICATION OF NEEDS Skin   Total Care Verbally Surgical wounds                       Personal Care Assistance Level of Assistance  Bathing, Feeding, Dressing Bathing Assistance: Maximum assistance Feeding assistance: Limited assistance Dressing Assistance: Maximum assistance     Functional Limitations Info  Sight, Hearing, Speech Sight Info: Adequate Hearing Info: Adequate Speech Info: Adequate    SPECIAL CARE FACTORS FREQUENCY  PT (By licensed PT), OT (By licensed OT)     PT Frequency: 5x week OT Frequency: 5x week            Contractures Contractures Info: Not present    Additional Factors Info  Code Status, Allergies Code Status Info: full Allergies Info: NKA           Current Medications (11/03/2022):  This is the current hospital active medication list Current Facility-Administered Medications  Medication Dose Route Frequency Provider Last Rate Last Admin   acetaminophen (TYLENOL) tablet 1,000 mg  1,000 mg Oral Q6H Tarry Kos, MD   1,000 mg at 11/03/22 3220   acetaminophen (TYLENOL) tablet 325-650 mg  325-650 mg Oral Q6H PRN Gershon Mussel  M, MD       albuterol (PROVENTIL) (2.5 MG/3ML) 0.083% nebulizer solution 2.5 mg  2.5 mg Inhalation PRN Tarry Kos, MD       alum & mag hydroxide-simeth (MAALOX/MYLANTA) 200-200-20 MG/5ML suspension 30 mL  30 mL Oral Q4H PRN Tarry Kos, MD       apixaban Everlene Balls) tablet 5 mg  5 mg Oral BID Paytes, Austin A, RPH   5 mg  at 11/03/22 0934   citalopram (CELEXA) tablet 20 mg  20 mg Oral QHS Tarry Kos, MD   20 mg at 11/02/22 2118   docusate sodium (COLACE) capsule 100 mg  100 mg Oral BID Tarry Kos, MD   100 mg at 11/03/22 0934   feeding supplement (ENSURE ENLIVE / ENSURE PLUS) liquid 237 mL  237 mL Oral BID BM Tarry Kos, MD   237 mL at 11/03/22 0940   fluticasone furoate-vilanterol (BREO ELLIPTA) 100-25 MCG/ACT 1 puff  1 puff Inhalation Daily Tarry Kos, MD   1 puff at 11/03/22 5409   And   umeclidinium bromide (INCRUSE ELLIPTA) 62.5 MCG/ACT 1 puff  1 puff Inhalation Daily Tarry Kos, MD   1 puff at 11/03/22 0823   HYDROcodone-acetaminophen (NORCO/VICODIN) 5-325 MG per tablet 1-2 tablet  1-2 tablet Oral Q6H PRN Tarry Kos, MD   2 tablet at 11/03/22 0325   HYDROmorphone (DILAUDID) injection 0.5-1 mg  0.5-1 mg Intravenous Q4H PRN Tarry Kos, MD   0.5 mg at 11/03/22 1124   levothyroxine (SYNTHROID) tablet 88 mcg  88 mcg Oral Q0600 Tarry Kos, MD   88 mcg at 11/03/22 8119   losartan (COZAAR) tablet 100 mg  100 mg Oral Daily Tarry Kos, MD   100 mg at 11/02/22 1003   magnesium citrate solution 1 Bottle  1 Bottle Oral Once PRN Tarry Kos, MD       menthol-cetylpyridinium (CEPACOL) lozenge 3 mg  1 lozenge Oral PRN Tarry Kos, MD       Or   phenol (CHLORASEPTIC) mouth spray 1 spray  1 spray Mouth/Throat PRN Tarry Kos, MD       metoprolol tartrate (LOPRESSOR) tablet 25 mg  25 mg Oral BID Tarry Kos, MD   25 mg at 11/03/22 0934   morphine (PF) 2 MG/ML injection 0.5 mg  0.5 mg Intravenous Q2H PRN Tarry Kos, MD   0.5 mg at 11/02/22 0756   ondansetron (ZOFRAN) tablet 4 mg  4 mg Oral Q6H PRN Tarry Kos, MD       Or   ondansetron [facility]) injection 4 mg  4 mg Intravenous Q6H PRN Tarry Kos, MD       pantoprazole (PROTONIX) EC tablet 40 mg  40 mg Oral Daily Tarry Kos, MD   40 mg at 11/03/22 0934   polyethylene glycol (MIRALAX / GLYCOLAX) packet 17 g  17 g Oral Daily PRN Tarry Kos, MD       rOPINIRole (REQUIP) tablet 1 mg  1 mg Oral QHS Tarry Kos, MD   1 mg at 11/02/22 2118   rosuvastatin (CRESTOR) tablet 20 mg  20 mg Oral Daily Tarry Kos, MD   20 mg at 11/02/22 2118   sorbitol 70 % solution 30 mL  30 mL Oral Daily PRN Tarry Kos, MD       tacrolimus (PROGRAF) capsule 1 mg  1 mg Oral BID Glade Lloyd, MD  1 mg at 11/03/22 0934   tranexamic acid (CYKLOKAPRON) IVPB 1,000 mg  1,000 mg Intravenous Once Tarry Kos, MD         Discharge Medications: Please see discharge summary for a list of discharge medications.  Relevant Imaging Results:  Relevant Lab Results:   Additional Information SSN   [id number]  Lorri Frederick, LCSW

## 2022-11-03 NOTE — Progress Notes (Signed)
PROGRESS NOTE    [name]  NWG:[id number] DOB: 1940-05-06 DOA: 10/31/2022 PCP: [name], NP   Brief Narrative:  82 y.o. 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  She was found to have acute comminuted intertrochanteric fracture of the proximal left femur.  She underwent surgical intervention by orthopedics on 11/02/2022.  Cardiology following as well.  Assessment & Plan:   Acute closed comminuted intertrochanteric fracture of the proximal left femur after a mechanical fall -Continue pain management.  Fall precautions. -Underwent intramedullary nailing of left femur on 11/02/2022 by orthopedics.  Wound care/pain management/activity/DVT prophylaxis as per orthopedics recommendations -PT/OT eval.  History of PE on Eliquis -Eliquis on hold for now.  Resume Eliquis once cleared by orthopedics.  History of PAT/SVT/wide-complex tachycardia on Zio patch -Continue metoprolol.  Currently rate controlled.  Cardiology following.  Currently rate controlled.  Hyponatremia -Mild.  Encourage oral intake.  Monitor  Leukocytosis -Mild.  Mostly reactive.  Monitor.  Acute blood loss anemia -Possibly from femoral fracture and perioperative blood loss.  Monitor H&H.  Essential hypertension Hyperlipidemia -Monitor blood pressure.  Continue statin, metoprolol and losartan.  Hypothyroidism -Continue levothyroxine  COPD -Currently stable.  Continue current inhaled regimen  OSA -Patient intolerant of CPAP  Obesity -Outpatient follow-up  Minimal-change  disease with CKD IIIa -creatinine stable. Continue tacrolimus.  Outpatient follow-up with nephrology.  DVT prophylaxis: None for surgical intervention Code Status: Full Family Communication: Daughter/Teri on phone Disposition Plan: Status is: Inpatient Remains inpatient appropriate because: Of severity of illness.  Need for PT eval.  Consultants: Orthopedic/cardiology  Procedures: Intramedullary nailing of left femur on 11/02/2022  Antimicrobials: Perioperative  Subjective: Patient seen and examined at bedside.  Still complains of intermittent left hip pain.  No fever, vomiting, chest pain reported.  Objective: Vitals:   11/02/22 1600 11/02/22 1800 11/02/22 2116 11/03/22 0500  BP: (!) 152/66 (!) 112/53 (!) 132/56 (!) 129/58  Pulse: 70 65 71   Resp: 16     Temp: 97.6 F (36.4 C) 98.6 F (37 C) 98.3 F (36.8 C)   TempSrc: Oral Oral Oral   SpO2: 96% 96% 96%   Weight:    95.3 kg  Height:        Intake/Output Summary (Last 24 hours) at 11/03/2022 0748 Last data filed at 11/03/2022 0545 Gross per 24 hour  Intake 1301.81 ml  Output 200 ml  Net 1101.81 ml   Filed Weights   10/31/22 1415 11/03/22 0500  Weight: 97.5 kg 95.3 kg    Examination:  General: No acute distress.  On 2 L oxygen by nasal cannula. ENT/neck: No palpable neck masses or elevated JVD noted  respiratory: Bilateral decreased breath sounds at bases bilaterally with scattered crackles  CVS: Rate mostly controlled; S1 and S2 are heard Abdominal: Soft, obese, nontender, distended mildly; no organomegaly, bowel sounds are heard normally Extremities: Left hip tenderness present.  Mild lower extremity edema present; no clubbing CNS: Alert and oriented.  Able to move extremities  lymph: No palpable lymphadenopathy noted  skin: No obvious petechiae/rashes psych: Affect is mostly flat.  Not agitated.   Data Reviewed: I have personally reviewed  following labs and imaging studies  CBC: Recent Labs  Lab  10/31/22 1506 11/02/22 0403 11/03/22 0433  WBC 9.4 11.0* 11.5*  NEUTROABS 7.7 7.4 9.1*  HGB 12.5 10.8* 9.3*  HCT 40.8 35.1* 30.2*  MCV 92.9 94.4 93.2  PLT 243 211 194   Basic Metabolic Panel: Recent Labs  Lab 10/31/22 1615 11/02/22 0403 11/03/22 0433  NA 138 138 133*  K 4.1 5.0 4.7  CL 105 102 101  CO2 27 28 26   GLUCOSE 122* 148* 175*  BUN 22 22 32*  CREATININE 0.91 1.19* 1.21*  CALCIUM 8.6* 8.5* 7.9*  MG  --  2.0 1.9   GFR: Estimated Creatinine Clearance: 40.2 mL/min (A) (by C-G formula based on SCr of 1.21 mg/dL (H)). Liver Function Tests: No results for input(s): "AST", "ALT", "ALKPHOS", "BILITOT", "PROT", "ALBUMIN" in the last 168 hours. No results for input(s): "LIPASE", "AMYLASE" in the last 168 hours. No results for input(s): "AMMONIA" in the last 168 hours. Coagulation Profile: Recent Labs  Lab 10/31/22 1506  INR 1.3*   Cardiac Enzymes: No results for input(s): "CKTOTAL", "CKMB", "CKMBINDEX", "TROPONINI" in the last 168 hours. BNP (last 3 results) No results for input(s): "PROBNP" in the last 8760 hours. HbA1C: No results for input(s): "HGBA1C" in the last 72 hours. CBG: Recent Labs  Lab 11/01/22 0736 11/01/22 1123 11/01/22 1624 11/02/22 0050 11/02/22 1103  GLUCAP 123* 110* 141* 146* 115*   Lipid Profile: No results for input(s): "CHOL", "HDL", "LDLCALC", "TRIG", "CHOLHDL", "LDLDIRECT" in the last 72 hours. Thyroid Function Tests: No results for input(s): "TSH", "T4TOTAL", "FREET4", "T3FREE", "THYROIDAB" in the last 72 hours. Anemia Panel: No results for input(s): "VITAMINB12", "FOLATE", "FERRITIN", "TIBC", "IRON", "RETICCTPCT" in the last 72 hours. Sepsis Labs: No results for input(s): "PROCALCITON", "LATICACIDVEN" in the last 168 hours.  Recent Results (from the past 240 hour(s))  Surgical pcr screen     Status: None   Collection Time: 11/02/22  5:40 AM   Specimen: Nasal Mucosa; Nasal Swab  Result Value Ref Range Status   MRSA, PCR  NEGATIVE NEGATIVE Final   Staphylococcus aureus NEGATIVE NEGATIVE Final    Comment: (NOTE) The Xpert SA Assay (FDA approved for NASAL specimens in patients 63 years of age and older), is one component of a comprehensive surveillance program. It is not intended to diagnose infection nor to guide or monitor treatment. Performed at [facility], [address] [place], [place] [address]          Radiology Studies: DG FEMUR MIN 2 VIEWS LEFT  Result Date: 11/02/2022 CLINICAL DATA:  Elective surgery. EXAM: LEFT FEMUR 2 VIEWS COMPARISON:  Preoperative radiograph FINDINGS: Seven fluoroscopic spot views of the left femur obtained in the operating room. Sequential images demonstrating intramedullary nail with trans trochanteric and distal locking screw fixation fixating proximal femur fracture. Fluoroscopy time 3 minutes 12 seconds. Dose 48.91 mGy. IMPRESSION: Intraoperative fluoroscopy during ORIF of proximal femur fracture. Electronically Signed   By: Narda Rutherford M.D.   On: 11/02/2022 16:44   DG C-Arm 1-60 Min-No Report  Result Date: 11/02/2022 Fluoroscopy was utilized by the requesting physician.  No radiographic interpretation.   DG C-Arm 1-60 Min-No Report  Result Date: 11/02/2022 Fluoroscopy was utilized by the requesting physician.  No radiographic interpretation.        Scheduled Meds:  acetaminophen  1,000 mg Oral Q6H   apixaban  5 mg Oral BID   citalopram  20 mg Oral QHS   docusate sodium  100 mg  Oral BID   feeding supplement  237 mL Oral BID BM   fluticasone furoate-vilanterol  1 puff Inhalation Daily   And   umeclidinium bromide  1 puff Inhalation Daily   levothyroxine  88 mcg Oral Q0600   losartan  100 mg Oral Daily   metoprolol tartrate  25 mg Oral BID   pantoprazole  40 mg Oral Daily   rOPINIRole  1 mg Oral QHS   rosuvastatin  20 mg Oral Daily   Continuous Infusions:  sodium chloride Stopped (11/02/22 1657)   tranexamic acid             Glade Lloyd, MD [facility] 11/03/2022, 7:48 AM

## 2022-11-03 NOTE — Plan of Care (Signed)
Problem: Clinical Measurements: Goal: Will remain free from infection Outcome: Progressing Goal: Diagnostic test results will improve Outcome: Progressing Goal: Respiratory complications will improve Outcome: Progressing Goal: Cardiovascular complication will be avoided Outcome: Progressing   Problem: Activity: Goal: Risk for activity intolerance will decrease Outcome: Progressing

## 2022-11-03 NOTE — Progress Notes (Signed)
Subjective: 1 Day Post-Op Procedure(s) (LRB): INTRAMEDULLARY NAILING OF LEFT FEMUR (Left) Patient reports pain as mild.    Objective: Vital signs in last 24 hours: Temp:  [97.6 F (36.4 C)-99.7 F (37.6 C)] 98.3 F (36.8 C) (09/23 2116) Pulse Rate:  [64-75] 71 (09/23 2116) Resp:  [10-18] 16 (09/23 1600) BP: (112-169)/(53-71) 129/58 (09/24 0500) SpO2:  [78 %-100 %] 96 % (09/23 2116) Weight:  [95.3 kg] 95.3 kg (09/24 0500)  Intake/Output from previous day: 09/23 0701 - 09/24 0700 In: 1301.8 [I.V.:1001.8; IV Piggyback:300] Out: 200 [Urine:150; Blood:50] Intake/Output this shift: No intake/output data recorded.  Recent Labs    10/31/22 1506 11/02/22 0403 11/03/22 0433  HGB 12.5 10.8* 9.3*   Recent Labs    11/02/22 0403 11/03/22 0433  WBC 11.0* 11.5*  RBC 3.72* 3.24*  HCT 35.1* 30.2*  PLT 211 194   Recent Labs    11/02/22 0403 11/03/22 0433  NA 138 133*  K 5.0 4.7  CL 102 101  CO2 28 26  BUN 22 32*  CREATININE 1.19* 1.21*  GLUCOSE 148* 175*  CALCIUM 8.5* 7.9*   Recent Labs    10/31/22 1506  INR 1.3*    Neurologically intact Neurovascular intact Sensation intact distally Intact pulses distally Dorsiflexion/Plantar flexion intact Incision: dressing C/D/I No cellulitis present Compartment soft   Assessment/Plan: 1 Day Post-Op Procedure(s) (LRB): INTRAMEDULLARY NAILING OF LEFT FEMUR (Left) Up with therapy WBAT LLE ABLA- mild and stable Ok to resume Eliquis from ortho standpoint F/u with Corrie Dandy Anwyn Kriegel pa-c two weeks po       Cristie Hem 11/03/2022, 7:39 AM

## 2022-11-03 NOTE — Plan of Care (Signed)

## 2022-11-03 NOTE — Evaluation (Signed)
Occupational Therapy Evaluation Patient Details Name: [name] MRN: [id number] DOB: 09-15-1940 Today's Date: 11/03/2022   History of Present Illness 82 y.o. female presented with fall and left hip pain. She was found to have acute comminuted intertrochanteric fracture of the proximal left femur. 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  She was unable to participate in bed mobility or transition to sitting secondary to being in too much pain, even when meds were given by IV at start of session.  She was able to complete grooming tasks with setup and HOB elevated, but even positional changes in the bed were severely painful.  Prior to session PT was able to transition to EOB with total +2 assistance and she was unable to stand.  At baseline pt is independent living with her daughter who works.  Feel currently based on limitations in ADL participation and pain limitations that pt will benefit from acute care OT to help progress ADL participation and tolerance.  Recommend continued inpatient follow up therapy, <3 hours/day post acute stay.          If plan is discharge home, recommend the following: Two people to help with walking and/or transfers;Two people to help with bathing/dressing/bathroom;Assist for transportation;Help with stairs or ramp for entrance    Functional Status Assessment  Patient has had a recent decline in their functional status and demonstrates the ability  to make significant improvements in function in a reasonable and predictable amount of time.  Equipment Recommendations  Other (comment) (TBD next venue of care)       Precautions / Restrictions Precautions Precautions: Fall Restrictions Weight Bearing Restrictions: No LLE Weight Bearing: Weight bearing as tolerated             ADL either performed or assessed with clinical judgement   ADL Overall ADL's : Needs assistance/impaired Eating/Feeding: Independent;Bed level   Grooming: Wash/dry hands;Wash/dry face;Brushing hair;Bed level;Set up   Upper Body Bathing: Supervision/ safety;Bed level Upper Body Bathing Details (indicate cue type and reason): simulated Lower Body Bathing: Total assistance;Bed level Lower Body Bathing Details (indicate cue type and reason): simulated                       General ADL Comments: Limited eval secondary to patient reporting significant pain in the LLE.  She declined to sit EOB and was unable to attempt rolling to the right side when asked secondary to pain.   Meds brought during session but had no impact.  Will assess transitional movements next visit.     Vision Baseline Vision/History: 0 No visual deficits Ability to See in Adequate Light: 0 Adequate Patient Visual Report: No change from baseline Vision Assessment?: No apparent visual deficits     Perception Perception: Not tested       Praxis Praxis: Not tested       Pertinent Vitals/Pain Pain Assessment Pain Assessment: Faces Pain Score: 10-Worst pain ever Pain Location: left hip Pain Descriptors / Indicators: Aching Pain Intervention(s): Limited activity within patient's tolerance,  Patient requesting pain meds-RN notified, RN gave pain meds during session     Extremity/Trunk Assessment Upper Extremity Assessment Upper Extremity Assessment: Overall WFL for tasks assessed   Lower Extremity Assessment Lower Extremity Assessment: Defer to PT evaluation LLE  Deficits / Details: Exquisite pain L hip with any movement LLE: Unable to fully assess due to pain       Communication Communication Communication: No apparent difficulties   Cognition Arousal: Alert Behavior During Therapy: WFL for tasks assessed/performed, Anxious Overall Cognitive Status: Within Functional Limits for tasks assessed                                                  Home Living Family/patient expects to be discharged to:: Private residence Living Arrangements: Children (lives with daughter) Available Help at Discharge: Available PRN/intermittently Type of Home: House Home Access: Stairs to enter Secretary/administrator of Steps: 2 Entrance Stairs-Rails: Right;Left Home Layout: Two level;Able to live on main level with bedroom/bathroom     Bathroom Shower/Tub: Producer, television/film/video: Standard     Home Equipment: Agricultural consultant (2 wheels);Cane - single point;BSC/3in1;Shower seat          Prior Functioning/Environment Prior Level of Function : Independent/Modified Independent;Driving             Mobility Comments: pt reports ind with amb without AD ADLs Comments: pt reports ind with self care, light household chores and cooking        OT Problem List: Impaired balance (sitting and/or standing);Pain;Decreased activity tolerance;Decreased knowledge of use of DME or AE      OT Treatment/Interventions: Self-care/ADL training;Patient/family education;DME and/or AE instruction;Therapeutic activities;Balance training    OT Goals(Current goals can be found in the care plan section) Acute Rehab OT Goals Patient Stated Goal: Pt understands she may need rehab prior to home. OT Goal Formulation: With patient Time For Goal Achievement: 11/17/22 Potential to Achieve Goals: Good  OT Frequency: Min 1X/week       AM-PAC OT "6 Clicks" Daily Activity     Outcome Measure Help from another person eating meals?: None Help from  another person taking care of personal grooming?: A Little Help from another person toileting, which includes using toliet, bedpan, or urinal?: Total Help from another person bathing (including washing, rinsing, drying)?: Total Help from another person to put on and taking off regular upper body clothing?: Total Help from another person to put on and taking off regular lower body clothing?: Total 6 Click Score: 11   End of Session Nurse Communication: Mobility status  Activity Tolerance: Patient limited by pain Patient left: in bed;with call bell/phone within reach  OT Visit Diagnosis: Unsteadiness on feet (R26.81);Other abnormalities of gait and mobility (R26.89);Muscle weakness (generalized) (M62.81);Pain Pain - Right/Left: Left Pain - part of body: Hip                Time: 1308-6578 OT Time Calculation (min): 29 min Charges:  OT General Charges $OT Visit: 1 Visit OT Evaluation $OT Eval Moderate Complexity: 1 Mod OT Treatments $Self Care/Home Management : 8-22 mins Perrin Maltese, OTR/L Acute Rehabilitation Services  Office [phone] 11/03/2022

## 2022-11-03 NOTE — Progress Notes (Signed)
One episode of sustained but brief ectopic atrial tachycardia around 7 AM, otherwise NSR. No change in therapy recommended.

## 2022-11-03 NOTE — TOC Initial Note (Signed)
Transition of Care [facility]) - Initial/Assessment Note    Patient Details  Name: [name] MRN: [id number] Date of Birth: [birth year]  Transition of Care [facility]) CM/SW Contact:    Lorri Frederick, LCSW Phone Number: 11/03/2022, 1:32 PM  Clinical Narrative:  CSW spoke with pt and her two children regarding DC recommendation for SNF.  Son [name] in the room, daughter [name] on speakerphone.  Pt lives with daughter [name], no current services.  Some discussion regarding SNF vs HH, pt stating she wants SNF, daughter worried about the quality of SNF options.  They are agreeable to send out referral, [facility].  They are going to look into hiring [facility] aide support to see if DC home with [facility] is feasible.  Medicare choice document provided.    Referral sent out in hub for SNF, CSW reached out to [facility] and [facility].                 Expected Discharge Plan: Skilled Nursing Facility Barriers to Discharge: Continued Medical Work up, SNF Pending bed offer   Patient Goals and CMS Choice Patient states their goals for this hospitalization and ongoing recovery are:: be mobile again CMS Medicare.gov Compare Post Acute Care list provided to:: Patient Choice offered to / list presented to : Patient      Expected Discharge Plan and Services In-house Referral: Clinical Social Work   Post Acute Care Choice: Skilled Nursing Facility Living arrangements for the past 2 months: Single Family Home                                      Prior Living Arrangements/Services Living arrangements for the past 2 months: Single Family Home Lives with:: Adult Children (lives with daughter [name]) Patient language and need for interpreter reviewed:: Yes Do you feel safe going back to the place where you live?: Yes      Need for Family Participation in Patient Care: Yes (Comment) Care giver support system in place?: Yes (comment) Current home services:  Other (comment) (none) Criminal Activity/Legal Involvement Pertinent to Current Situation/Hospitalization: No - Comment as needed  Activities of Daily Living      Permission Sought/Granted Permission sought to share information with : Family Supports Permission granted to share information with : Yes, Verbal Permission Granted  Share Information with NAME: daughter [name], son [name]  Permission granted to share info w AGENCY: SNF        Emotional Assessment Appearance:: Appears stated age Attitude/Demeanor/Rapport: Engaged Affect (typically observed): Appropriate, Pleasant Orientation: : Oriented to Self, Oriented to Place, Oriented to  Time, Oriented to Situation      Admission diagnosis:  Closed displaced intertrochanteric fracture of left femur, initial encounter (HCC) [S72.142A] Closed left hip fracture, initial encounter (HCC) [S72.002A] Patient Active Problem List   Diagnosis Date Noted   Closed comminuted intertrochanteric fracture of proximal femur, left, initial encounter (HCC) 10/31/2022   Accidental fall 10/31/2022   Chronic anticoagulation 10/31/2022   History of pulmonary embolism 10/31/2022   History of PAT (paroxysmal atrial tachycardia) 10/31/2022   Closed left hip fracture, initial encounter (HCC) 10/31/2022   Acute pancreatitis 06/13/2022   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/26/2022   Acquired hypercoagulable state (HCC) 01/26/2022   Bilateral hearing loss 11/04/2020   Diabetes mellitus without complication (HCC) 11/04/2020   RLS (restless legs syndrome) 11/04/2020  OSA (obstructive sleep apnea) 08/29/2019   Abnormal findings on diagnostic imaging of lung 08/29/2019   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   Pulmonary embolism (HCC) 02/07/2019   GERD without esophagitis 05/15/2017   Dyslipidemia 05/15/2017   Anxiety 05/15/2017   Constipation due to opioid therapy 05/15/2017   Accidental fall from chair, initial encounter 05/08/2017    Closed posttraumatic compression fracture of lumbar vertebra (HCC) 05/08/2017   Leukocytosis 05/08/2017   S/P right TKA 07/10/2014   S/P knee replacement 07/10/2014   COPD (chronic obstructive pulmonary disease) (HCC) 09/24/2013   Hypothyroidism 07/24/2012   Essential hypertension, benign 07/24/2012   Allergic rhinitis 07/24/2012   Mixed hyperlipidemia 07/24/2012   PCP:  [name], NP Pharmacy:   [facility] [phone] - [place], [place] - [address] [place] [address] Phone: [phone] Fax: [phone]     Social Determinants of Health (SDOH) Social History: SDOH Screenings   Food Insecurity: No Food Insecurity (06/13/2022)  Housing: Low Risk  (06/13/2022)  Transportation Needs: No Transportation Needs (06/13/2022)  Utilities: Not At Risk (06/13/2022)  Depression (PHQ2-9): Low Risk  (06/22/2022)  Tobacco Use: Medium Risk (11/02/2022)   SDOH Interventions:     Readmission Risk Interventions     No data to display

## 2022-11-03 NOTE — Progress Notes (Signed)
Foley cath removed, pt is d/t void

## 2022-11-04 DIAGNOSIS — S72142A Displaced intertrochanteric fracture of left femur, initial encounter for closed fracture: Secondary | ICD-10-CM | POA: Diagnosis not present

## 2022-11-04 LAB — CBC
HCT: 26.5 % — ABNORMAL LOW (ref 36.0–46.0)
Hemoglobin: 8.2 g/dL — ABNORMAL LOW (ref 12.0–15.0)
MCH: 29.1 pg (ref 26.0–34.0)
MCHC: 30.9 g/dL (ref 30.0–36.0)
MCV: 94 fL (ref 80.0–100.0)
Platelets: 198 10*3/uL (ref 150–400)
RBC: 2.82 MIL/uL — ABNORMAL LOW (ref 3.87–5.11)
RDW: 15 % (ref 11.5–15.5)
WBC: 11.2 10*3/uL — ABNORMAL HIGH (ref 4.0–10.5)
nRBC: 0 % (ref 0.0–0.2)

## 2022-11-04 LAB — BASIC METABOLIC PANEL
Anion gap: 7 (ref 5–15)
BUN: 47 mg/dL — ABNORMAL HIGH (ref 8–23)
CO2: 27 mmol/L (ref 22–32)
Calcium: 8.3 mg/dL — ABNORMAL LOW (ref 8.9–10.3)
Chloride: 102 mmol/L (ref 98–111)
Creatinine, Ser: 1.42 mg/dL — ABNORMAL HIGH (ref 0.44–1.00)
GFR, Estimated: 37 mL/min — ABNORMAL LOW (ref >60)
Glucose, Bld: 133 mg/dL — ABNORMAL HIGH (ref 70–99)
Potassium: 4.7 mmol/L (ref 3.5–5.1)
Sodium: 136 mmol/L (ref 135–145)

## 2022-11-04 LAB — MAGNESIUM: Magnesium: 2.3 mg/dL (ref 1.7–2.4)

## 2022-11-04 NOTE — Progress Notes (Signed)
Physical Therapy Treatment Patient Details Name: [name] MRN: [id number] DOB: 1940-12-28 Today's Date: 11/04/2022   History of Present Illness 82 y.o. female presented with fall and left hip pain. She was found to have acute comminuted intertrochanteric fracture of the proximal left femur. 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, SVT and PAT in  06/2022 with a 22-second wide complex rhythm on follow-up Zio patch 07/2022, on metoprolol currently followed by cardiology, history of lap chole in May secondary to gallstone pancreatitis complicated by  post-op hypoxia/fluid overload/PAT    PT Comments  Continuing work on functional mobility and activity tolerance;  Session focused on functional transfers, and pt was able to tolerate sitting up EOB, squat pivot transfer to recliner, and sit to stand trial to rW form recliner; Premedicated, and better able to tolerate LLE movement, but still very anxious with anticipation of pain; Patient will benefit from continued inpatient follow up therapy, <3 hours/day    If plan is discharge home, recommend the following: Two people to help with walking and/or transfers;Two people to help with bathing/dressing/bathroom   Can travel by private vehicle     No  Equipment Recommendations  Rolling walker (2 wheels);BSC/3in1;Wheelchair (measurements PT);Wheelchair cushion (measurements PT);Hospital bed    Recommendations for Other Services Other (comment) (Mobility Specialists for assist with safe transfers)     Precautions / Restrictions Precautions Precautions: Fall Precaution Comments: Premedicate for pain Restrictions LLE Weight Bearing: Weight bearing as tolerated     Mobility  Bed Mobility Overal bed mobility: Needs Assistance Bed Mobility: Supine to Sit     Supine to sit: +2 for  physical assistance, Total assist     General bed mobility comments: Used bed pad and 2 person assist for getting to EOB, and then back to bed, helicopter method    Transfers Overall transfer level: Needs assistance Equipment used: 2 person hand held assist, Rolling walker (2 wheels) Transfers: Bed to chair/wheelchair/BSC, Sit to/from Stand Sit to Stand: +2 physical assistance, Max assist     Squat pivot transfers: +2 physical assistance, Max assist     General transfer comment: Performed squat pivot transfer bed to recliner (placed on pt's R side) with 2 person assist and R knee blocked for stability; minimal weight acceptance on LLE; Stood from recliner to wheelchair with 2 person max assist; still painful, and pt's trunk was flexed fully over RW, but able to shift weight fully onto LEs (mostly RLE)    Ambulation/Gait                   Stairs             Wheelchair Mobility     Tilt Bed    Modified Rankin (Stroke Patients Only)       Balance     Sitting balance-Leahy Scale: Poor       Standing balance-Leahy Scale: Zero                              Cognition Arousal: Alert Behavior During Therapy: WFL for tasks assessed/performed, Anxious Overall Cognitive Status: Within Functional Limits for tasks assessed  Exercises      General Comments General comments (skin integrity, edema, etc.): PT's son, [name] present and encouraging during session      Pertinent Vitals/Pain Pain Assessment Pain Assessment: Faces Faces Pain Scale: Hurts whole lot Pain Location: left hip Pain Descriptors / Indicators: Aching, Crying Pain Intervention(s): Monitored during session, Premedicated before session, Repositioned, Limited activity within patient's tolerance    Home Living                          Prior Function            PT Goals (current goals can now be found in  the care plan section) Acute Rehab PT Goals Patient Stated Goal: Back to independence PT Goal Formulation: With patient Time For Goal Achievement: 11/17/22 Potential to Achieve Goals: Good Progress towards PT goals: Progressing toward goals    Frequency    Min 1X/week      PT Plan      Co-evaluation              AM-PAC PT "6 Clicks" Mobility   Outcome Measure  Help needed turning from your back to your side while in a flat bed without using bedrails?: Total Help needed moving from lying on your back to sitting on the side of a flat bed without using bedrails?: Total Help needed moving to and from a bed to a chair (including a wheelchair)?: Total Help needed standing up from a chair using your arms (e.g., wheelchair or bedside chair)?: Total Help needed to walk in hospital room?: Total Help needed climbing 3-5 steps with a railing? : Total 6 Click Score: 6    End of Session Equipment Utilized During Treatment: Gait belt;Other (comment) (and placed Maximove pad) Activity Tolerance: Patient limited by pain Patient left: in chair;with call bell/phone within reach;with family/visitor present Nurse Communication: Mobility status;Need for lift equipment;Other (comment) (Mobility TEam can assist with back to bed) PT Visit Diagnosis: Pain;Other abnormalities of gait and mobility (R26.89);Muscle weakness (generalized) (M62.81);Unsteadiness on feet (R26.81) Pain - Right/Left: Left Pain - part of body: Hip     Time: 1610-9604 PT Time Calculation (min) (ACUTE ONLY): 40 min  Charges:    $Therapeutic Activity: 38-52 mins PT General Charges $$ ACUTE PT VISIT: 1 Visit                     Van Clines, PT  Acute Rehabilitation Services Office [phone] Secure Chat welcomed    Levi Aland 11/04/2022, 1:58 PM

## 2022-11-04 NOTE — Progress Notes (Signed)
Subjective: 2 Days Post-Op Procedure(s) (LRB): INTRAMEDULLARY NAILING OF LEFT FEMUR (Left) Patient reports pain as mild.    Objective: Vital signs in last 24 hours: Temp:  [98 F (36.7 C)-98.8 F (37.1 C)] 98 F (36.7 C) (09/25 0527) Pulse Rate:  [70-90] 76 (09/25 0527) Resp:  [14-17] 15 (09/25 0527) BP: (102-128)/(48-70) 128/52 (09/25 0527) SpO2:  [93 %-97 %] 96 % (09/25 0527)  Intake/Output from previous day: 09/24 0701 - 09/25 0700 In: 720 [P.O.:720] Out: 300 [Urine:300] Intake/Output this shift: No intake/output data recorded.  Recent Labs    11/02/22 0403 11/03/22 0433 11/04/22 0305  HGB 10.8* 9.3* 8.2*   Recent Labs    11/03/22 0433 11/04/22 0305  WBC 11.5* 11.2*  RBC 3.24* 2.82*  HCT 30.2* 26.5*  PLT 194 198   Recent Labs    11/03/22 0433 11/04/22 0305  NA 133* 136  K 4.7 4.7  CL 101 102  CO2 26 27  BUN 32* 47*  CREATININE 1.21* 1.42*  GLUCOSE 175* 133*  CALCIUM 7.9* 8.3*   No results for input(s): "LABPT", "INR" in the last 72 hours.  Neurologically intact Neurovascular intact Sensation intact distally Intact pulses distally Dorsiflexion/Plantar flexion intact Incision: dressing C/D/I No cellulitis present Compartment soft   Assessment/Plan: 2 Days Post-Op Procedure(s) (LRB): INTRAMEDULLARY NAILING OF LEFT FEMUR (Left) Up with therapy Weightbearing: WBAT LLE Insicional and dressing care: Dressings left intact until follow-up Orthopedic device(s): None Showering: pod #3, but needs to cover bandages VTE prophylaxis: on eliquis at baseline Pain control: oxycodone Follow - up plan: 2 weeks Contact information:  Deeric Cruise PA      Cristie Hem 11/04/2022, 7:35 AM

## 2022-11-04 NOTE — Progress Notes (Signed)
Mobility Specialist: Progress Note   11/04/22 1609  Mobility  Activity Transferred from chair to bed  Level of Assistance Total care (+2)  Assistive Device None  LLE Weight Bearing WBAT  Activity Response Tolerated well  Mobility Referral Yes  $Mobility charge 1 Mobility  Mobility Specialist Start Time (ACUTE ONLY) 1353  Mobility Specialist Stop Time (ACUTE ONLY) 1417  Mobility Specialist Time Calculation (min) (ACUTE ONLY) 24 min    Pt requested to be moved back to bed - received in chair. Opted for stand pivot instead of maxi move. TotA+2 for transfer, maxA +2 for bed mobility with helicopter maneuver. Had c/o pain throughout transfer especially during pivot. Left in bed with all needs met, call bell in reach.   Maurene Capes Mobility Specialist Please contact via SecureChat or Rehab office at [phone]

## 2022-11-04 NOTE — Progress Notes (Signed)
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  She was found to have acute comminuted intertrochanteric fracture of the proximal left femur.  She underwent surgical intervention by orthopedics on 11/02/2022.  Cardiology following as well.   Assessment & Plan:   Principal Problem:   Closed comminuted intertrochanteric fracture of proximal femur, left, initial encounter (HCC) Active Problems:   Accidental fall   Chronic anticoagulation   History of pulmonary embolism   History of PAT (paroxysmal atrial tachycardia)   Essential hypertension, benign   Hypothyroidism   COPD (chronic obstructive pulmonary disease) (HCC)   OSA (obstructive sleep apnea)   Minimal change disease   Closed left hip fracture, initial encounter (HCC)   Acute closed intertrochanteric fracture proximal left femur, status post mechanical fall: Continue pain management. Fall precautions. She underwent intramedullary nailing of left femur on 11/02/2022 by orthopedics.   Continue Wound care/ pain management /activity. DVT prophylaxis as per orthopedics recommendations -PT/OT eval.   History of PE on Eliquis: Continue Eliquis postoperatively.   History of PAT/SVT/wide-complex tachycardia on Zio patch: Continue metoprolol.  Currently rate controlled.   Cardiology signed off.   Hyponatremia: Encourage oral intake.  Serum sodium  improved.   Leukocytosis: -Mild.  Mostly reactive.  Monitor.   Acute blood loss anemia: Possibly from postoperative blood loss.  Monitor H&H.   Essential hypertension: Continue statin, metoprolol and losartan.   Hypothyroidism: Continue levothyroxine   COPD Currently stable. Continue current inhaled regimen   OSA Patient intolerant of CPAP   Obesity: Outpatient follow-up. Diet and exercise discussed in detail   Minimal-change disease with CKD IIIa Creatinine stable. Continue tacrolimus.   Outpatient follow-up with nephrology.   DVT prophylaxis: Eliquis Code Status: Full code Family Communication: No family at bed side. Disposition Plan:   Status is: Inpatient Remains inpatient appropriate because: Patient admitted for hip fracture underwent ORIF.  Postoperative day 2.   Consultants:  Orthopaedics Cardiology  Procedures: ORIF Antimicrobials:  Anti-infectives (From admission, onward)    Start     Dose/Rate Route Frequency Ordered Stop   11/02/22 1900  ceFAZolin (ANCEF) IVPB 2g/100 mL premix        2 g 200 mL/hr over 30 Minutes Intravenous Every 6 hours 11/02/22 1620 11/03/22 0657   11/02/22 1100  ceFAZolin (ANCEF) IVPB 2g/100 mL premix        2 g 200 mL/hr over 30 Minutes Intravenous On call to O.R. 11/02/22 1056 11/02/22 1346      Subjective: Patient was seen and examined at bedside.  Overnight events noted.   Patient  S/P ORIF POD #1.  Reports pain is worse,  She has participated in physical therapy.  Objective: Vitals:   11/03/22 2022 11/04/22 0527 11/04/22 0758 11/04/22 0810  BP: (!) 121/57 (!) 128/52 (!) 119/52   Pulse: 90 76 76   Resp: 14 15 17    Temp: 98.8 F (37.1 C) 98 F (36.7 C) 98.3 F (36.8 C)   TempSrc: Oral  Oral Oral   SpO2: 97% 96% 95% 97%  Weight:      Height:        Intake/Output Summary (Last 24 hours) at 11/04/2022 1221 Last data filed at 11/03/2022 1700 Gross per 24 hour  Intake 480 ml  Output 300 ml  Net 180 ml    Filed Weights   10/31/22 1415 11/03/22 0500  Weight: 97.5 kg 95.3 kg    Examination:  General exam: Appears calm and comfortable, deconditioned, remains in a lot of pain. Respiratory system: Clear to auscultation. Respiratory effort normal. RR 15 Cardiovascular system: S1 & S2 heard, RRR. No murmer ,  No pedal edema. Gastrointestinal system: Abdomen is non distended, soft and non tender.  Normal bowel sounds heard. Central nervous system: Alert and oriented x 3. No focal neurological deficits. Extremities: S/P Let ORIF, POD 2 Skin: No rashes, lesions or ulcers Psychiatry: Judgement and insight appear normal. Mood & affect appropriate.     Data Reviewed: I have personally reviewed following labs and imaging studies  CBC: Recent Labs  Lab 10/31/22 1506 11/02/22 0403 11/03/22 0433 11/04/22 0305  WBC 9.4 11.0* 11.5* 11.2*  NEUTROABS 7.7 7.4 9.1*  --   HGB 12.5 10.8* 9.3* 8.2*  HCT 40.8 35.1* 30.2* 26.5*  MCV 92.9 94.4 93.2 94.0  PLT 243 211 194 198   Basic Metabolic Panel: Recent Labs  Lab 10/31/22 1615 11/02/22 0403 11/03/22 0433 11/04/22 0305  NA 138 138 133* 136  K 4.1 5.0 4.7 4.7  CL 105 102 101 102  CO2 27 28 26 27   GLUCOSE 122* 148* 175* 133*  BUN 22 22 32* 47*  CREATININE 0.91 1.19* 1.21* 1.42*  CALCIUM 8.6* 8.5* 7.9* 8.3*  MG  --  2.0 1.9 2.3   GFR: Estimated Creatinine Clearance: 34.3 mL/min (A) (by C-G formula based on SCr of 1.42 mg/dL (H)). Liver Function Tests: No results for input(s): "AST", "ALT", "ALKPHOS", "BILITOT", "PROT", "ALBUMIN" in the last 168 hours. No results for input(s): "LIPASE", "AMYLASE" in the last 168 hours. No results for input(s): "AMMONIA" in the last 168 hours. Coagulation Profile: Recent Labs  Lab 10/31/22 1506  INR 1.3*   Cardiac Enzymes: No results for input(s): "CKTOTAL", "CKMB", "CKMBINDEX", "TROPONINI" in the last 168 hours. BNP (last 3 results) No results for input(s): "PROBNP" in the last 8760  hours. HbA1C: No results for input(s): "HGBA1C" in the last 72 hours. CBG: Recent Labs  Lab 11/01/22 0736 11/01/22 1123 11/01/22 1624 11/02/22 0050 11/02/22 1103  GLUCAP 123* 110* 141* 146* 115*   Lipid Profile: No results for input(s): "CHOL", "HDL", "LDLCALC", "TRIG", "CHOLHDL", "LDLDIRECT" in the last 72 hours. Thyroid Function Tests: No results for input(s): "TSH", "T4TOTAL", "FREET4", "T3FREE", "THYROIDAB" in the last 72 hours. Anemia Panel: No results for input(s): "VITAMINB12", "FOLATE", "FERRITIN", "TIBC", "IRON", "RETICCTPCT" in the last 72 hours. Sepsis Labs: No results for input(s): "PROCALCITON", "LATICACIDVEN" in the last 168 hours.  Recent Results (from the past 240 hour(s))  Surgical pcr screen     Status: None   Collection Time: 11/02/22  5:40 AM   Specimen: Nasal Mucosa; Nasal Swab  Result Value Ref Range Status   MRSA, PCR NEGATIVE NEGATIVE Final   Staphylococcus aureus NEGATIVE NEGATIVE Final    Comment: (NOTE) The Xpert SA Assay (FDA approved for NASAL specimens in patients 57 years of age and older), is one component of a comprehensive surveillance program. It is not intended to diagnose infection nor to guide or monitor treatment. Performed at  [facility], [address] [place], [place] [address]     Radiology Studies: DG FEMUR MIN 2 VIEWS LEFT  Result Date: 11/02/2022 CLINICAL DATA:  Elective surgery. EXAM: LEFT FEMUR 2 VIEWS COMPARISON:  Preoperative radiograph FINDINGS: Seven fluoroscopic spot views of the left femur obtained in the operating room. Sequential images demonstrating intramedullary nail with trans trochanteric and distal locking screw fixation fixating proximal femur fracture. Fluoroscopy time 3 minutes 12 seconds. Dose 48.91 mGy. IMPRESSION: Intraoperative fluoroscopy during ORIF of proximal femur fracture. Electronically Signed   By: Narda Rutherford M.D.   On: 11/02/2022 16:44   DG C-Arm 1-60 Min-No Report  Result Date:  11/02/2022 Fluoroscopy was utilized by the requesting physician.  No radiographic interpretation.   DG C-Arm 1-60 Min-No Report  Result Date: 11/02/2022 Fluoroscopy was utilized by the requesting physician.  No radiographic interpretation.    Scheduled Meds:  apixaban  5 mg Oral BID   citalopram  20 mg Oral QHS   docusate sodium  100 mg Oral BID   feeding supplement  237 mL Oral BID BM   fluticasone furoate-vilanterol  1 puff Inhalation Daily   And   umeclidinium bromide  1 puff Inhalation Daily   levothyroxine  88 mcg Oral Q0600   metoprolol tartrate  25 mg Oral BID   pantoprazole  40 mg Oral Daily   rOPINIRole  1 mg Oral QHS   rosuvastatin  20 mg Oral Daily   tacrolimus  1 mg Oral BID   Continuous Infusions:  tranexamic acid       LOS: 4 days    Time spent: 50 mins    Willeen Niece, MD [facility]   If 7PM-7AM, please contact night-coverage

## 2022-11-04 NOTE — TOC Progression Note (Addendum)
Transition of Care [facility]) - Progression Note    Patient Details  Name: Ryden Kruszka MRN: [id number] Date of Birth: 1940/09/28  Transition of [facility]) CM/SW Contact  Lorri Frederick, LCSW Phone Number: 11/04/2022, 11:51 AM  Clinical Narrative:   CSW spoke with pt, son Leonette Most, daughter Barth Kirks on speakerphone.  Discussed bed offers, they are wanting pt to remain in hospital longer, but will accept offer at [facility].  [facility] informed.  1315: Auth request submitted in [place] and approved: [id number], 5 days: 9/26-9/30.      Expected Discharge Plan: Skilled Nursing Facility Barriers to Discharge: Continued Medical Work up, SNF Pending bed offer  Expected Discharge Plan and Services In-house Referral: Clinical Social Work   Post Acute Care Choice: Skilled Nursing Facility Living arrangements for the past 2 months: Single Family Home                                       Social Determinants of Health (SDOH) Interventions SDOH Screenings   Food Insecurity: No Food Insecurity (06/13/2022)  Housing: Low Risk  (06/13/2022)  Transportation Needs: No Transportation Needs (06/13/2022)  Utilities: Not At Risk (06/13/2022)  Depression (PHQ2-9): Low Risk  (06/22/2022)  Tobacco Use: Medium Risk (11/02/2022)    Readmission Risk Interventions     No data to display

## 2022-11-04 NOTE — Progress Notes (Signed)
No new arrhythmic events in the last 24 hours.  Continue same treatment.  Will sign off.  She has a follow-up appointment scheduled 12/17/2022.  Please reconsult if needed.

## 2022-11-04 NOTE — Plan of Care (Signed)
  Problem: Education: Goal: Knowledge of General Education information will improve Description Including pain rating scale, medication(s)/side effects and non-pharmacologic comfort measures Outcome: Progressing   

## 2022-11-05 DIAGNOSIS — S72142A Displaced intertrochanteric fracture of left femur, initial encounter for closed fracture: Secondary | ICD-10-CM | POA: Diagnosis not present

## 2022-11-05 LAB — COMPREHENSIVE METABOLIC PANEL
ALT: 12 U/L (ref 0–44)
AST: 24 U/L (ref 15–41)
Albumin: 2.3 g/dL — ABNORMAL LOW (ref 3.5–5.0)
Alkaline Phosphatase: 34 U/L — ABNORMAL LOW (ref 38–126)
Anion gap: 8 (ref 5–15)
BUN: 50 mg/dL — ABNORMAL HIGH (ref 8–23)
CO2: 26 mmol/L (ref 22–32)
Calcium: 8.2 mg/dL — ABNORMAL LOW (ref 8.9–10.3)
Chloride: 104 mmol/L (ref 98–111)
Creatinine, Ser: 1.31 mg/dL — ABNORMAL HIGH (ref 0.44–1.00)
GFR, Estimated: 40 mL/min — ABNORMAL LOW (ref >60)
Glucose, Bld: 144 mg/dL — ABNORMAL HIGH (ref 70–99)
Potassium: 4.9 mmol/L (ref 3.5–5.1)
Sodium: 138 mmol/L (ref 135–145)
Total Bilirubin: 0.6 mg/dL (ref 0.3–1.2)
Total Protein: 5.7 g/dL — ABNORMAL LOW (ref 6.5–8.1)

## 2022-11-05 LAB — CBC
HCT: 24.2 % — ABNORMAL LOW (ref 36.0–46.0)
Hemoglobin: 7.4 g/dL — ABNORMAL LOW (ref 12.0–15.0)
MCH: 28.1 pg (ref 26.0–34.0)
MCHC: 30.6 g/dL (ref 30.0–36.0)
MCV: 92 fL (ref 80.0–100.0)
Platelets: 229 10*3/uL (ref 150–400)
RBC: 2.63 MIL/uL — ABNORMAL LOW (ref 3.87–5.11)
RDW: 15.3 % (ref 11.5–15.5)
WBC: 9.7 10*3/uL (ref 4.0–10.5)
nRBC: 0 % (ref 0.0–0.2)

## 2022-11-05 LAB — BPAM RBC
Blood Product Expiration Date: 202410032359
ISSUE DATE / TIME: 202409261144
Unit Type and Rh: 6200

## 2022-11-05 LAB — GLUCOSE, CAPILLARY
Glucose-Capillary: 118 mg/dL — ABNORMAL HIGH (ref 70–99)
Glucose-Capillary: 123 mg/dL — ABNORMAL HIGH (ref 70–99)
Glucose-Capillary: 95 mg/dL (ref 70–99)

## 2022-11-05 LAB — TYPE AND SCREEN
ABO/RH(D): A POS
Antibody Screen: NEGATIVE
Unit division: 0

## 2022-11-05 LAB — HEMOGLOBIN AND HEMATOCRIT, BLOOD
HCT: 24.7 % — ABNORMAL LOW (ref 36.0–46.0)
HCT: 27 % — ABNORMAL LOW (ref 36.0–46.0)
Hemoglobin: 7.7 g/dL — ABNORMAL LOW (ref 12.0–15.0)
Hemoglobin: 8.4 g/dL — ABNORMAL LOW (ref 12.0–15.0)

## 2022-11-05 LAB — MAGNESIUM: Magnesium: 2.2 mg/dL (ref 1.7–2.4)

## 2022-11-05 LAB — PHOSPHORUS: Phosphorus: 3.8 mg/dL (ref 2.5–4.6)

## 2022-11-05 LAB — PREPARE RBC (CROSSMATCH)

## 2022-11-05 MED ORDER — SODIUM CHLORIDE 0.9% IV SOLUTION: Freq: Once | INTRAVENOUS | Status: AC

## 2022-11-05 NOTE — TOC Progression Note (Signed)
Transition of Care [facility]) - Progression Note    Patient Details  Name: [name] MRN: [id number] Date of Birth: 25-Jul-1940  Transition of Care [facility]) CM/SW Contact  Lorri Frederick, LCSW Phone Number: 11/05/2022, 10:22 AM  Clinical Narrative:    1000: Message from [employer].  They can receive pt today.  1010: Per MD, no DC today.  Nikki informed.     Expected Discharge Plan: Skilled Nursing Facility Barriers to Discharge: Continued Medical Work up, SNF Pending bed offer  Expected Discharge Plan and Services In-house Referral: Clinical Social Work   Post Acute Care Choice: Skilled Nursing Facility Living arrangements for the past 2 months: Single Family Home                                       Social Determinants of Health (SDOH) Interventions SDOH Screenings   Food Insecurity: No Food Insecurity (06/13/2022)  Housing: Low Risk  (06/13/2022)  Transportation Needs: No Transportation Needs (06/13/2022)  Utilities: Not At Risk (06/13/2022)  Depression (PHQ2-9): Low Risk  (06/22/2022)  Tobacco Use: Medium Risk (11/02/2022)    Readmission Risk Interventions     No data to display

## 2022-11-05 NOTE — Progress Notes (Signed)
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  She was found to have acute comminuted intertrochanteric fracture of the proximal left femur.  She underwent surgical intervention by orthopedics on 11/02/2022.  Cardiology following as well.   Assessment & Plan:   Principal Problem:   Closed comminuted intertrochanteric fracture of proximal femur, left, initial encounter (HCC) Active Problems:   Accidental fall   Chronic anticoagulation   History of pulmonary embolism   History of PAT (paroxysmal atrial tachycardia)   Essential hypertension, benign   Hypothyroidism   COPD (chronic obstructive pulmonary disease) (HCC)   OSA (obstructive sleep apnea)   Minimal change disease   Closed left hip fracture, initial encounter (HCC)   Acute closed intertrochanteric fracture proximal left femur, status post mechanical fall: Continue pain management. Fall precautions. She underwent intramedullary nailing of left femur on 11/02/2022 by orthopedics.   Continue Wound care/ pain management /activity. DVT prophylaxis as per orthopedics recommendations. -PT/OT eval >  SNF.   History of PE on Eliquis: Continue Eliquis postoperatively.   History of PAT/SVT/wide-complex tachycardia on Zio patch: Continue metoprolol.  Currently rate controlled.   Cardiology signed off.   Hyponatremia: Encourage oral intake.  Serum  sodium improved.   Leukocytosis: -Mild.  Mostly reactive.  Monitor.   Acute blood loss anemia: Possibly from postoperative blood loss.  Transfuse 1 unit PRBC , could be postoperative from surgery  Monitor H&H.  Essential hypertension: Continue statin, metoprolol and losartan.   Hypothyroidism: Continue levothyroxine   COPD Currently stable. Continue current inhaled regimen.   OSA Patient intolerant of CPAP.    Obesity: Outpatient follow-up. Diet and exercise discussed in detail   Minimal-change disease with CKD IIIa Creatinine stable. Continue tacrolimus.   Outpatient follow-up with nephrology.   DVT prophylaxis: Eliquis Code Status: Full code Family Communication: No family at bed side. Disposition Plan:   Status is: Inpatient Remains inpatient appropriate because: Patient admitted for hip fracture underwent ORIF.  Postoperative day 3.   Consultants:  Orthopaedics Cardiology  Procedures: ORIF Antimicrobials:  Anti-infectives (From admission, onward)    Start     Dose/Rate Route Frequency Ordered Stop   11/02/22 1900  ceFAZolin (ANCEF) IVPB 2g/100 mL premix        2 g 200 mL/hr over 30 Minutes Intravenous Every 6 hours 11/02/22 1620 11/03/22 0657   11/02/22 1100  ceFAZolin (ANCEF) IVPB 2g/100 mL premix        2 g 200 mL/hr over 30 Minutes Intravenous On call to O.R. 11/02/22 1056 11/02/22 1346      Subjective: Patient was seen and examined at bedside. Overnight events noted.   Patient is  S/P ORIF POD #3.  Reports pain is worse,  She has participated in physical therapy.  Objective: Vitals:   11/05/22 0820 11/05/22 0955 11/05/22 1155 11/05/22 1215  BP: (!) 125/48  (!) 109/43 (!) 102/42  Pulse: 76  72 69  Resp: 16  19 12   Temp: 97.8 F (  36.6 C)  98.2 F (36.8 C) 98.3 F (36.8 C)  TempSrc: Oral  Oral Oral  SpO2: (!) 39% 93% 94% 97%  Weight:      Height:        Intake/Output Summary (Last 24 hours) at 11/05/2022 1316 Last data filed at  11/05/2022 1155 Gross per 24 hour  Intake 250 ml  Output --  Net 250 ml   Filed Weights   10/31/22 1415 11/03/22 0500 11/05/22 0800  Weight: 97.5 kg 95.3 kg 101.6 kg    Examination:  General exam: Appears comfortable, deconditioned, not in any acute distress. Respiratory system: Clear to auscultation. Respiratory effort normal. RR 14 Cardiovascular system: S1 & S2 heard, RRR. No murmer ,  No pedal edema. Gastrointestinal system: Abdomen is non distended, soft and non tender.  Normal bowel sounds heard. Central nervous system: Alert and oriented x 3. No focal neurological deficits. Extremities: S/P Let ORIF, POD 3 Skin: No rashes, lesions or ulcers Psychiatry: Judgement and insight appear normal. Mood & affect appropriate.     Data Reviewed: I have personally reviewed following labs and imaging studies  CBC: Recent Labs  Lab 10/31/22 1506 11/02/22 0403 11/03/22 0433 11/04/22 0305 11/05/22 0417 11/05/22 0854  WBC 9.4 11.0* 11.5* 11.2* 9.7  --   NEUTROABS 7.7 7.4 9.1*  --   --   --   HGB 12.5 10.8* 9.3* 8.2* 7.4* 7.7*  HCT 40.8 35.1* 30.2* 26.5* 24.2* 24.7*  MCV 92.9 94.4 93.2 94.0 92.0  --   PLT 243 211 194 198 229  --    Basic Metabolic Panel: Recent Labs  Lab 10/31/22 1615 11/02/22 0403 11/03/22 0433 11/04/22 0305 11/05/22 0417  NA 138 138 133* 136 138  K 4.1 5.0 4.7 4.7 4.9  CL 105 102 101 102 104  CO2 27 28 26 27 26   GLUCOSE 122* 148* 175* 133* 144*  BUN 22 22 32* 47* 50*  CREATININE 0.91 1.19* 1.21* 1.42* 1.31*  CALCIUM 8.6* 8.5* 7.9* 8.3* 8.2*  MG  --  2.0 1.9 2.3 2.2  PHOS  --   --   --   --  3.8   GFR: Estimated Creatinine Clearance: 38.4 mL/min (A) (by C-G formula based on SCr of 1.31 mg/dL (H)). Liver Function Tests: Recent Labs  Lab 11/05/22 0417  AST 24  ALT 12  ALKPHOS 34*  BILITOT 0.6  PROT 5.7*  ALBUMIN 2.3*   No results for input(s): "LIPASE", "AMYLASE" in the last 168 hours. No results for input(s): "AMMONIA" in the last 168  hours. Coagulation Profile: Recent Labs  Lab 10/31/22 1506  INR 1.3*   Cardiac Enzymes: No results for input(s): "CKTOTAL", "CKMB", "CKMBINDEX", "TROPONINI" in the last 168 hours. BNP (last 3 results) No results for input(s): "PROBNP" in the last 8760 hours. HbA1C: No results for input(s): "HGBA1C" in the last 72 hours. CBG: Recent Labs  Lab 11/01/22 1624 11/02/22 0050 11/02/22 1103 11/05/22 0821 11/05/22 1205  GLUCAP 141* 146* 115* 118* 123*   Lipid Profile: No results for input(s): "CHOL", "HDL", "LDLCALC", "TRIG", "CHOLHDL", "LDLDIRECT" in the last 72 hours. Thyroid Function Tests: No results for input(s): "TSH", "T4TOTAL", "FREET4", "T3FREE", "THYROIDAB" in the last 72 hours. Anemia Panel: No results for input(s): "VITAMINB12", "FOLATE", "FERRITIN", "TIBC", "IRON", "RETICCTPCT" in the last 72 hours. Sepsis Labs: No results for input(s): "PROCALCITON", "LATICACIDVEN" in the last 168 hours.  Recent Results (from the past 240 hour(s))  Surgical pcr screen     Status: None  Collection Time: 11/02/22  5:40 AM   Specimen: Nasal Mucosa; Nasal Swab  Result Value Ref Range Status   MRSA, PCR NEGATIVE NEGATIVE Final   Staphylococcus aureus NEGATIVE NEGATIVE Final    Comment: (NOTE) The Xpert SA Assay (FDA approved for NASAL specimens in patients 42 years of age and older), is one component of a comprehensive surveillance program. It is not intended to diagnose infection nor to guide or monitor treatment. Performed at [facility], [address], [place], [place] [address]     Radiology Studies: No results found.  Scheduled Meds:  apixaban  5 mg Oral BID   citalopram  20 mg Oral QHS   docusate sodium  100 mg Oral BID   feeding supplement  237 mL Oral BID BM   fluticasone furoate-vilanterol  1 puff Inhalation Daily   And   umeclidinium bromide  1 puff Inhalation Daily   levothyroxine  88 mcg Oral Q0600   metoprolol tartrate  25 mg Oral BID   pantoprazole   40 mg Oral Daily   rOPINIRole  1 mg Oral QHS   rosuvastatin  20 mg Oral Daily   tacrolimus  1 mg Oral BID   Continuous Infusions:  tranexamic acid       LOS: 5 days    Time spent: 35 mins    Willeen Niece, MD [facility]   If 7PM-7AM, please contact night-coverage

## 2022-11-05 NOTE — Plan of Care (Signed)

## 2022-11-05 NOTE — Plan of Care (Signed)

## 2022-11-05 NOTE — Progress Notes (Signed)
Mobility Specialist: Progress Note   11/05/22 1643  Mobility  Activity Dangled on edge of bed  Level of Assistance Maximum assist, patient does 25-49% (+2)  Assistive Device None  LLE Weight Bearing WBAT  Activity Response Tolerated poorly  Mobility Referral Yes  $Mobility charge 1 Mobility  Mobility Specialist Start Time (ACUTE ONLY) 1547  Mobility Specialist Stop Time (ACUTE ONLY) 1621  Mobility Specialist Time Calculation (min) (ACUTE ONLY) 34 min    Pt was agreeable to mobility session with a lot of encouragement - received in bed. MaxA for bed mobility - pt was able to sit up with assist and walk RLE over a little then two MS assisted with helicopter method. Unable to tolerate ambulation or STS d/t pain, but able to sit on EOB without assistance. Left in bed with all needs met, call bell in reach. Family in room.  Maurene Capes Mobility Specialist Please contact via SecureChat or Rehab office at [phone]

## 2022-11-06 DIAGNOSIS — R2681 Unsteadiness on feet: Secondary | ICD-10-CM | POA: Diagnosis not present

## 2022-11-06 DIAGNOSIS — M6281 Muscle weakness (generalized): Secondary | ICD-10-CM | POA: Diagnosis not present

## 2022-11-06 DIAGNOSIS — S72142D Displaced intertrochanteric fracture of left femur, subsequent encounter for closed fracture with routine healing: Secondary | ICD-10-CM | POA: Diagnosis not present

## 2022-11-06 DIAGNOSIS — E039 Hypothyroidism, unspecified: Secondary | ICD-10-CM | POA: Diagnosis not present

## 2022-11-06 DIAGNOSIS — N183 Chronic kidney disease, stage 3 unspecified: Secondary | ICD-10-CM | POA: Diagnosis not present

## 2022-11-06 DIAGNOSIS — S79919A Unspecified injury of unspecified hip, initial encounter: Secondary | ICD-10-CM | POA: Diagnosis not present

## 2022-11-06 DIAGNOSIS — D649 Anemia, unspecified: Secondary | ICD-10-CM | POA: Diagnosis not present

## 2022-11-06 DIAGNOSIS — R2689 Other abnormalities of gait and mobility: Secondary | ICD-10-CM | POA: Diagnosis not present

## 2022-11-06 DIAGNOSIS — J449 Chronic obstructive pulmonary disease, unspecified: Secondary | ICD-10-CM | POA: Diagnosis not present

## 2022-11-06 DIAGNOSIS — I479 Paroxysmal tachycardia, unspecified: Secondary | ICD-10-CM | POA: Diagnosis not present

## 2022-11-06 DIAGNOSIS — F329 Major depressive disorder, single episode, unspecified: Secondary | ICD-10-CM | POA: Diagnosis not present

## 2022-11-06 DIAGNOSIS — N390 Urinary tract infection, site not specified: Secondary | ICD-10-CM | POA: Diagnosis not present

## 2022-11-06 DIAGNOSIS — I129 Hypertensive chronic kidney disease with stage 1 through stage 4 chronic kidney disease, or unspecified chronic kidney disease: Secondary | ICD-10-CM | POA: Diagnosis not present

## 2022-11-06 DIAGNOSIS — I2782 Chronic pulmonary embolism: Secondary | ICD-10-CM | POA: Diagnosis not present

## 2022-11-06 DIAGNOSIS — R300 Dysuria: Secondary | ICD-10-CM | POA: Diagnosis not present

## 2022-11-06 DIAGNOSIS — Z743 Need for continuous supervision: Secondary | ICD-10-CM | POA: Diagnosis not present

## 2022-11-06 DIAGNOSIS — Z9181 History of falling: Secondary | ICD-10-CM | POA: Diagnosis not present

## 2022-11-06 DIAGNOSIS — S72142A Displaced intertrochanteric fracture of left femur, initial encounter for closed fracture: Secondary | ICD-10-CM | POA: Diagnosis not present

## 2022-11-06 DIAGNOSIS — F320 Major depressive disorder, single episode, mild: Secondary | ICD-10-CM | POA: Diagnosis not present

## 2022-11-06 DIAGNOSIS — R41841 Cognitive communication deficit: Secondary | ICD-10-CM | POA: Diagnosis not present

## 2022-11-06 LAB — BASIC METABOLIC PANEL
Anion gap: 9 (ref 5–15)
BUN: 40 mg/dL — ABNORMAL HIGH (ref 8–23)
CO2: 28 mmol/L (ref 22–32)
Calcium: 8.6 mg/dL — ABNORMAL LOW (ref 8.9–10.3)
Chloride: 104 mmol/L (ref 98–111)
Creatinine, Ser: 1.02 mg/dL — ABNORMAL HIGH (ref 0.44–1.00)
GFR, Estimated: 55 mL/min — ABNORMAL LOW (ref >60)
Glucose, Bld: 182 mg/dL — ABNORMAL HIGH (ref 70–99)
Potassium: 4.6 mmol/L (ref 3.5–5.1)
Sodium: 141 mmol/L (ref 135–145)

## 2022-11-06 LAB — PHOSPHORUS: Phosphorus: 3.8 mg/dL (ref 2.5–4.6)

## 2022-11-06 LAB — MAGNESIUM: Magnesium: 2.1 mg/dL (ref 1.7–2.4)

## 2022-11-06 MED ORDER — METOPROLOL TARTRATE 25 MG PO TABS: 25.0000 mg | ORAL_TABLET | Freq: Two times a day (BID) | ORAL | 0 refills | Status: DC

## 2022-11-06 MED ORDER — DOCUSATE SODIUM 100 MG PO CAPS: 100.0000 mg | ORAL_CAPSULE | Freq: Two times a day (BID) | ORAL | 0 refills | Status: AC

## 2022-11-06 NOTE — Progress Notes (Signed)
AVS, signed printed prescription, and social worker's paperwork placed in discharge packet. Report called and given to [employer], Charity fundraiser at [employer]. All questions answered to satisfaction. [facility] to transport pt with all belongings.

## 2022-11-06 NOTE — Progress Notes (Signed)
Physical Therapy Treatment Patient Details Name: [name] MRN: [id number] DOB: 1940-08-05 Today's Date: 11/06/2022   History of Present Illness 82 y.o. female presented with fall and left hip pain. She was found to have acute comminuted intertrochanteric fracture of the proximal left femur. 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  Emphasis on warm up ROM L Hip, bridging/scooting to EOB, transition to sitting and then squaring up on EOB, sitting balance, sit to stand technique/safety and transfers bed to [facility] with peri care in standing.     If plan is discharge home, recommend the following: Two people to help with walking and/or transfers;Two people to help with bathing/dressing/bathroom   Can travel by private vehicle     No  Equipment Recommendations  Rolling walker (2 wheels);BSC/3in1;Wheelchair (measurements PT);Wheelchair cushion (measurements PT);Hospital bed    Recommendations for Other Services       Precautions / Restrictions Precautions Precautions: Fall Precaution Comments: Premedicate for pain Restrictions LLE Weight Bearing: Weight bearing as tolerated     Mobility  Bed Mobility Overal bed mobility: Needs Assistance Bed Mobility: Supine to Sit     Supine to sit: +2 for physical assistance, Total assist Sit to supine: +2 for physical assistance, Total assist   General bed mobility comments: Used bed pad and 2 person assist for getting to EOB, and then back to bed, helicopter method     Transfers Overall transfer level: Needs assistance Equipment used: Rolling walker (2 wheels), None, 1 person hand held assist (with RW and no AD and Face to Face) Transfers: Sit to/from Stand, Bed to chair/wheelchair/BSC Sit to Stand: +2 physical assistance, Max assist Stand pivot transfers: Max assist, +2 physical assistance         General transfer comment: Sit to stand in the RW, bed to bsc back to bed with face to face assist, cues for hand placement and technique.    Ambulation/Gait               General Gait Details: unable at this point   Stairs             Wheelchair Mobility     Tilt Bed    Modified Rankin (Stroke Patients Only)       Balance Overall balance assessment: Needs assistance Sitting-balance support: No upper extremity supported, Bilateral upper extremity supported, Feet supported Sitting balance-Leahy Scale: Poor Sitting balance - Comments: pt able to sit with hands in her lap for secs only before needing to use UE's for stability     Standing balance-Leahy Scale: Zero                              Cognition Arousal: Alert Behavior During Therapy: WFL for tasks assessed/performed, Anxious Overall Cognitive Status: Within Functional Limits for tasks assessed  Exercises Total Joint Exercises Ankle Circles/Pumps: AROM, 15 reps Quad Sets: AROM, AAROM, Both, 10 reps, Supine Gluteal Sets: Both, 10 reps, Supine Heel Slides: AAROM, 10 reps, Left, Supine Hip ABduction/ADduction: AAROM, Left, 10 reps, Supine Bridges: AAROM, Both, 5 reps, Supine    General Comments        Pertinent Vitals/Pain Pain Assessment Pain Assessment: Faces Faces Pain Scale: Hurts whole lot Pain Location: left hip Pain Descriptors / Indicators: Aching, Crying, Grimacing, Guarding Pain Intervention(s): Limited activity within patient's tolerance, Monitored during session    Home  Living                          Prior Function            PT Goals (current goals can now be found in the care plan section) Acute Rehab PT Goals PT Goal Formulation: With patient Time For Goal Achievement: 11/17/22 Potential to Achieve Goals: Good Progress towards PT goals: Progressing toward goals    Frequency    Min 1X/week      PT Plan      Co-evaluation              AM-PAC PT "6 Clicks" Mobility   Outcome Measure  Help needed turning from your back to your side while in a flat bed without using bedrails?: Total Help needed moving from lying on your back to sitting on the side of a flat bed without using bedrails?: Total Help needed moving to and from a bed to a chair (including a wheelchair)?: Total Help needed standing up from a chair using your arms (e.g., wheelchair or bedside chair)?: Total Help needed to walk in hospital room?: Total Help needed climbing 3-5 steps with a railing? : Total 6 Click Score: 6    End of Session   Activity Tolerance: Patient limited by pain Patient left: in bed;with call bell/phone within reach;with bed alarm set Nurse Communication: Mobility status;Need for lift equipment PT Visit Diagnosis: Other abnormalities of gait and mobility (R26.89);Pain Pain - Right/Left: Left Pain - part of body: Hip     Time: 1132-1222 PT Time Calculation (min) (ACUTE ONLY): 50 min  Charges:    $Therapeutic Exercise: 8-22 mins $Therapeutic Activity: 8-22 mins $Self Care/Home Management: 8-22 PT General Charges $$ ACUTE PT VISIT: 1 Visit                     11/06/2022  Jacinto Halim., PT Acute Rehabilitation Services [phone]  (office)   Eliseo Gum Hanin [place] 11/06/2022, 1:02 PM

## 2022-11-06 NOTE — Plan of Care (Signed)
  Problem: Pain Managment: Goal: General experience of comfort will improve Outcome: Progressing   Problem: Safety: Goal: Ability to remain free from injury will improve Outcome: Progressing   Problem: Skin Integrity: Goal: Risk for impaired skin integrity will decrease Outcome: Progressing   

## 2022-11-06 NOTE — TOC Transition Note (Signed)
Transition of Care [facility]) - CM/SW Discharge Note   Patient Details  Name: [name] MRN: [id number] Date of Birth: May 21, 1940  Transition of Care [facility]) CM/SW Contact:  Lorri Frederick, LCSW Phone Number: 11/06/2022, 1:38 PM   Clinical Narrative:   Pt discharging to [employer].  RN call report to [phone].     Final next level of care: Skilled Nursing Facility Barriers to Discharge: Barriers Resolved   Patient Goals and CMS Choice CMS Medicare.gov Compare Post Acute Care list provided to:: Patient Choice offered to / list presented to : Patient  Discharge Placement                Patient chooses bed at: [facility] Patient to be transferred to facility by: ptar Name of family member notified: [name] Patient and family notified of of transfer: 11/06/22  Discharge Plan and Services Additional resources added to the After Visit Summary for   In-house Referral: Clinical Social Work   Post Acute Care Choice: Skilled Nursing Facility                               Social Determinants of Health (SDOH) Interventions SDOH Screenings   Food Insecurity: No Food Insecurity (06/13/2022)  Housing: Low Risk  (06/13/2022)  Transportation Needs: No Transportation Needs (06/13/2022)  Utilities: Not At Risk (06/13/2022)  Depression (PHQ2-9): Low Risk  (06/22/2022)  Tobacco Use: Medium Risk (11/02/2022)     Readmission Risk Interventions     No data to display

## 2022-11-06 NOTE — Discharge Summary (Signed)
Physician Discharge Summary  [name] WJX:[id number] DOB: 12-15-1940 DOA: 10/31/2022  PCP: [name], NP  Admit date: 10/31/2022  Discharge date: 11/06/2022  Admitted From: Home.  Disposition:  SNF  Recommendations for Outpatient Follow-up:  Follow up with PCP in 1-2 weeks. Please obtain BMP/CBC in one week. Advised to follow-up with orthopedics as scheduled. Advised to continue aggressive bowel regimen for constipation.  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  She was found to have acute comminuted intertrochanteric fracture of the proximal left femur.  She underwent surgical intervention by orthopedics on 11/02/2022.  Cardiology following as well.  Patient feels much improved.  Participated with physical therapy recommended SNF. Cardiology signed off.  Patient is being discharged to SNF after insurance approval is received  Discharge Diagnoses:  Principal Problem:   Closed comminuted intertrochanteric fracture of proximal femur, left, initial encounter (HCC) Active Problems:   Accidental fall   Chronic anticoagulation   History of pulmonary embolism   History of PAT (paroxysmal atrial tachycardia)   Essential hypertension, benign   Hypothyroidism   COPD (chronic obstructive pulmonary disease) (HCC)   OSA (obstructive sleep apnea)   Minimal  change disease   Closed left hip fracture, initial encounter (HCC)  Acute closed intertrochanteric fracture proximal left femur, status post mechanical fall: Continue pain management. Fall precautions. She underwent intramedullary nailing of left femur on 11/02/2022 by orthopedics.   Continue Wound care/ pain management /activity. DVT prophylaxis as per orthopedics recommendations. -PT/OT eval >  SNF.   History of PE on Eliquis: Continue Eliquis postoperatively.   History of PAT/SVT/wide-complex tachycardia on Zio patch: Continue metoprolol.Currently rate controlled.   Cardiology signed off.   Hyponatremia: Encourage oral intake.Serum sodium improved.   Leukocytosis: -Mild.  Mostly reactive.  Monitor.   Acute blood loss anemia: Possibly from postoperative blood loss.  Transfuse 1 unit PRBC , could be postoperative from surgery. Hb improved to 8.4.   Essential hypertension: Continue statin, metoprolol and losartan.   Hypothyroidism: Continue levothyroxine.   COPD Currently stable. Continue current inhaled regimen.   OSA Patient intolerant of CPAP.    Obesity: Outpatient follow-up. Diet and exercise discussed in detail   Minimal-change disease with CKD IIIa Creatinine stable. Continue tacrolimus.   Outpatient follow-up with nephrology.  Discharge Instructions  Discharge Instructions     Call MD for:  difficulty breathing, headache or visual disturbances   Complete by: As directed    Call MD for:  persistant dizziness or light-headedness   Complete by: As directed    Diet - low sodium heart healthy   Complete by: As directed    Diet Carb Modified   Complete by: As directed    Discharge instructions   Complete by: As directed    Advised to follow-up with primary care physician in 1 week. Advised to follow-up with orthopedics as scheduled. Advised to continue aggressive bowel regimen for constipation.   Increase activity slowly   Complete by: As directed  Weight bearing as tolerated   Complete by: As directed       Allergies as of 11/06/2022   No Known Allergies      Medication List     TAKE these medications    albuterol 108 (90 Base) MCG/ACT inhaler Commonly known as: VENTOLIN HFA Inhale 1-2 puffs into the lungs every 6 (six) hours as needed for wheezing or shortness of breath.   citalopram 20 MG tablet Commonly known as: CELEXA Take 1 tablet (20 mg total) by mouth at bedtime. DX F41.9   docusate sodium 100 MG capsule Commonly known as: COLACE Take 1 capsule (100 mg total) by mouth 2 (two) times daily.   donepezil 10 MG tablet Commonly known as: ARICEPT TAKE 1 TABLET BY MOUTH EVERYDAY AT BEDTIME What changed: See the new instructions.   Eliquis 5 MG Tabs tablet Generic drug: apixaban TAKE 1 TABLET BY MOUTH TWICE A DAY   ferrous sulfate 325 (65 FE) MG EC tablet TAKE 1 TABLET BY MOUTH EVERY DAY WITH BREAKFAST What changed: See the new instructions.   fluticasone 50 MCG/ACT nasal spray Commonly known as: FLONASE SPRAY 1 SPRAY INTO EACH NOSTRIL DAILY What changed: See the new instructions.   guaiFENesin 600 MG 12 hr tablet Commonly known as: MUCINEX Take 1,200 mg by mouth as needed for to loosen phlegm.   ipratropium 0.03 % nasal spray Commonly known as: ATROVENT SPRAY 2 SPRAYS INTO EACH NOSTRIL 3 TIMES A DAY AS NEEDED FOR RHINITIS What changed: See the new instructions.   levothyroxine 88 MCG tablet Commonly known as: SYNTHROID TAKE 1 TABLET BY MOUTH EVERY DAY IN THE MORNING What changed: See the new instructions.   losartan 100 MG tablet Commonly known as: COZAAR TAKE 1 TABLET BY MOUTH EVERY DAY   Melatonin 10 MG Tabs Take 10 mg by mouth at bedtime.   metFORMIN 500 MG tablet Commonly known as: GLUCOPHAGE Take 1 tablet (500 mg total) by mouth daily with supper.   metoprolol tartrate 25 MG tablet Commonly known as: LOPRESSOR Take 1 tablet (25 mg total) by mouth 2 (two) times daily. What changed:   how much to take when to take this   montelukast 10 MG tablet Commonly known as: SINGULAIR TAKE 1 TABLET BY MOUTH EVERYDAY AT BEDTIME   multivitamin with minerals Tabs tablet Take 1 tablet by mouth daily with breakfast.   oxyCODONE 5 MG immediate release tablet Commonly known as: Oxy IR/ROXICODONE Take 1-2 tablets (5-10 mg total) by mouth every 8 (eight) hours as needed for severe pain.   pantoprazole 40 MG tablet Commonly known as: PROTONIX TAKE 1 TABLET BY MOUTH EVERY DAY What changed: when to take this   rOPINIRole 1 MG tablet Commonly known as: REQUIP TAKE 1 TABLET BY MOUTH EVERYDAY AT BEDTIME What changed: See the new instructions.   rosuvastatin 20 MG tablet Commonly known as: CRESTOR Take 1 tablet (20 mg total) by mouth daily. What changed: when to take this   tacrolimus 1 MG capsule Commonly known as: PROGRAF Take 1 mg by mouth in the morning and at bedtime.   Trelegy Ellipta 100-62.5-25 MCG/ACT Aepb Generic drug: Fluticasone-Umeclidin-Vilant INHALE 1 PUFF BY MOUTH EVERY DAY What changed: See the new instructions.   Tylenol 325 MG Caps Generic drug: Acetaminophen Take 325-650 mg by mouth every 6 (six) hours as needed (for pain or headaches).   VITAMIN D3 PO Take 1 capsule by mouth daily.  Discharge Care Instructions  (From admission, onward)           Start     Ordered   11/02/22 0000  Weight bearing as tolerated        11/02/22 1231            Follow-up Information     Cristie Hem, PA-C Follow up in 2 week(s).   Specialty: Orthopedic Surgery Why: For suture removal, For wound re-check Contact information: [address] [place] [address] [phone]         [name], NP Follow up in 1 week(s).   Specialty: Geriatric Medicine Contact information: [address] [place] [address] [phone]                No Known  Allergies  Consultations: Orthopeadics   Procedures/Studies: DG FEMUR MIN 2 VIEWS LEFT  Result Date: 11/02/2022 CLINICAL DATA:  Elective surgery. EXAM: LEFT FEMUR 2 VIEWS COMPARISON:  Preoperative radiograph FINDINGS: Seven fluoroscopic spot views of the left femur obtained in the operating room. Sequential images demonstrating intramedullary nail with trans trochanteric and distal locking screw fixation fixating proximal femur fracture. Fluoroscopy time 3 minutes 12 seconds. Dose 48.91 mGy. IMPRESSION: Intraoperative fluoroscopy during ORIF of proximal femur fracture. Electronically Signed   By: Narda Rutherford M.D.   On: 11/02/2022 16:44   DG C-Arm 1-60 Min-No Report  Result Date: 11/02/2022 Fluoroscopy was utilized by the requesting physician.  No radiographic interpretation.   DG C-Arm 1-60 Min-No Report  Result Date: 11/02/2022 Fluoroscopy was utilized by the requesting physician.  No radiographic interpretation.   Chest Portable 1 View  Result Date: 10/31/2022 CLINICAL DATA:  Preop hip fracture EXAM: PORTABLE CHEST 1 VIEW COMPARISON:  Chest x-ray 06/19/2022 FINDINGS: The heart is mildly enlarged, unchanged. The lungs are clear. There is no pleural effusion or pneumothorax. No acute fractures are seen. IMPRESSION: 1. No active disease. 2. Mild cardiomegaly. Electronically Signed   By: Darliss Cheney M.D.   On: 10/31/2022 19:07   DG Hip Unilat With Pelvis 2-3 Views Left  Result Date: 10/31/2022 CLINICAL DATA:  Left hip injury, pain EXAM: DG HIP (WITH OR WITHOUT PELVIS) 2-3V LEFT COMPARISON:  06/13/2022 FINDINGS: Acute, comminuted intertrochanteric fracture of the proximal left femur with varus angulation. The lateral trochanteric fracture fragment is mildly displaced medially. Hip joint alignment is maintained without dislocation. Bony pelvis intact without fracture or diastasis. Soft tissue swelling at the fracture site. IMPRESSION: Acute, comminuted intertrochanteric fracture of the  proximal left femur. Electronically Signed   By: Duanne Guess D.O.   On: 10/31/2022 16:04     Subjective: Patient was seen and examined at bedside.  Overnight events noted.   Patient reports doing much better,  wants to be discharged.   Patient being discharged to SNF,  insurance approval is received.  Discharge Exam: Vitals:   11/06/22 0700 11/06/22 0753  BP: (!) 130/55   Pulse: 87 83  Resp: 20 15  Temp: 99.5 F (37.5 C)   SpO2: 90%    Vitals:   11/06/22 0500 11/06/22 0700 11/06/22 0753 11/06/22 0809  BP: (!) 127/50 (!) 130/55    Pulse: 78 87 83   Resp: 16 20 15    Temp:  99.5 F (37.5 C)    TempSrc:  Oral    SpO2: (!) 87% 90%    Weight:    101.8 kg  Height:        General: Pt is alert, awake, not in  acute distress Cardiovascular: RRR, S1/S2 +, no rubs, no gallops Respiratory: CTA bilaterally, no wheezing, no rhonchi Abdominal: Soft, NT, ND, bowel sounds + Extremities: Left ORIF , mild tenderness.    The results of significant diagnostics from this hospitalization (including imaging, microbiology, ancillary and laboratory) are listed below for reference.     Microbiology: Recent Results (from the past 240 hour(s))  Surgical pcr screen     Status: None   Collection Time: 11/02/22  5:40 AM   Specimen: Nasal Mucosa; Nasal Swab  Result Value Ref Range Status   MRSA, PCR NEGATIVE NEGATIVE Final   Staphylococcus aureus NEGATIVE NEGATIVE Final    Comment: (NOTE) The Xpert SA Assay (FDA approved for NASAL specimens in patients 23 years of age and older), is one component of a comprehensive surveillance program. It is not intended to diagnose infection nor to guide or monitor treatment. Performed at [facility], [address] [place], [place] [address]      Labs: BNP (last 3 results) Recent Labs    06/15/22 1651 06/19/22 0414  BNP 234.1* 139.2*   Basic Metabolic Panel: Recent Labs  Lab 11/02/22 0403 11/03/22 0433 11/04/22 0305  11/05/22 0417 11/06/22 1112  NA 138 133* 136 138 141  K 5.0 4.7 4.7 4.9 4.6  CL 102 101 102 104 104  CO2 28 26 27 26 28   GLUCOSE 148* 175* 133* 144* 182*  BUN 22 32* 47* 50* 40*  CREATININE 1.19* 1.21* 1.42* 1.31* 1.02*  CALCIUM 8.5* 7.9* 8.3* 8.2* 8.6*  MG 2.0 1.9 2.3 2.2 2.1  PHOS  --   --   --  3.8 3.8   Liver Function Tests: Recent Labs  Lab 11/05/22 0417  AST 24  ALT 12  ALKPHOS 34*  BILITOT 0.6  PROT 5.7*  ALBUMIN 2.3*   No results for input(s): "LIPASE", "AMYLASE" in the last 168 hours. No results for input(s): "AMMONIA" in the last 168 hours. CBC: Recent Labs  Lab 10/31/22 1506 11/02/22 0403 11/03/22 0433 11/04/22 0305 11/05/22 0417 11/05/22 0854 11/05/22 1711  WBC 9.4 11.0* 11.5* 11.2* 9.7  --   --   NEUTROABS 7.7 7.4 9.1*  --   --   --   --   HGB 12.5 10.8* 9.3* 8.2* 7.4* 7.7* 8.4*  HCT 40.8 35.1* 30.2* 26.5* 24.2* 24.7* 27.0*  MCV 92.9 94.4 93.2 94.0 92.0  --   --   PLT 243 211 194 198 229  --   --    Cardiac Enzymes: No results for input(s): "CKTOTAL", "CKMB", "CKMBINDEX", "TROPONINI" in the last 168 hours. BNP: Invalid input(s): "POCBNP" CBG: Recent Labs  Lab 11/02/22 0050 11/02/22 1103 11/05/22 0821 11/05/22 1205 11/05/22 1706  GLUCAP 146* 115* 118* 123* 95   D-Dimer No results for input(s): "DDIMER" in the last 72 hours. Hgb A1c No results for input(s): "HGBA1C" in the last 72 hours. Lipid Profile No results for input(s): "CHOL", "HDL", "LDLCALC", "TRIG", "CHOLHDL", "LDLDIRECT" in the last 72 hours. Thyroid function studies No results for input(s): "TSH", "T4TOTAL", "T3FREE", "THYROIDAB" in the last 72 hours.  Invalid input(s): "FREET3" Anemia work up No results for input(s): "VITAMINB12", "FOLATE", "FERRITIN", "TIBC", "IRON", "RETICCTPCT" in the last 72 hours. Urinalysis    Component Value Date/Time   COLORURINE YELLOW 06/13/2022 0750   APPEARANCEUR CLEAR 06/13/2022 0750   LABSPEC >1.046 (H) 06/13/2022 0750   PHURINE 5.5  06/13/2022 0750   GLUCOSEU NEGATIVE 06/13/2022 0750   HGBUR NEGATIVE 06/13/2022 0750   BILIRUBINUR  NEGATIVE 06/13/2022 0750   BILIRUBINUR Neg 01/12/2013 1604   KETONESUR NEGATIVE 06/13/2022 0750   PROTEINUR TRACE (A) 06/13/2022 0750   UROBILINOGEN 0.2 07/04/2014 1317   NITRITE NEGATIVE 06/13/2022 0750   LEUKOCYTESUR NEGATIVE 06/13/2022 0750   Sepsis Labs Recent Labs  Lab 11/02/22 0403 11/03/22 0433 11/04/22 0305 11/05/22 0417  WBC 11.0* 11.5* 11.2* 9.7   Microbiology Recent Results (from the past 240 hour(s))  Surgical pcr screen     Status: None   Collection Time: 11/02/22  5:40 AM   Specimen: Nasal Mucosa; Nasal Swab  Result Value Ref Range Status   MRSA, PCR NEGATIVE NEGATIVE Final   Staphylococcus aureus NEGATIVE NEGATIVE Final    Comment: (NOTE) The Xpert SA Assay (FDA approved for NASAL specimens in patients 27 years of age and older), is one component of a comprehensive surveillance program. It is not intended to diagnose infection nor to guide or monitor treatment. Performed at [facility], [address] [place], [place] [address]      Time coordinating discharge: Over 30 minutes  SIGNED:   Willeen Niece, MD  [facility] 11/06/2022, 1:34 PM Pager   If 7PM-7AM, please contact night-coverage

## 2022-11-09 DIAGNOSIS — S72142D Displaced intertrochanteric fracture of left femur, subsequent encounter for closed fracture with routine healing: Secondary | ICD-10-CM | POA: Diagnosis not present

## 2022-11-09 DIAGNOSIS — J449 Chronic obstructive pulmonary disease, unspecified: Secondary | ICD-10-CM | POA: Diagnosis not present

## 2022-11-09 DIAGNOSIS — F329 Major depressive disorder, single episode, unspecified: Secondary | ICD-10-CM | POA: Diagnosis not present

## 2022-11-09 DIAGNOSIS — M6281 Muscle weakness (generalized): Secondary | ICD-10-CM | POA: Diagnosis not present

## 2022-11-09 DIAGNOSIS — R2689 Other abnormalities of gait and mobility: Secondary | ICD-10-CM | POA: Diagnosis not present

## 2022-11-09 DIAGNOSIS — R2681 Unsteadiness on feet: Secondary | ICD-10-CM | POA: Diagnosis not present

## 2022-11-10 DIAGNOSIS — S72142D Displaced intertrochanteric fracture of left femur, subsequent encounter for closed fracture with routine healing: Secondary | ICD-10-CM | POA: Diagnosis not present

## 2022-11-10 DIAGNOSIS — M6281 Muscle weakness (generalized): Secondary | ICD-10-CM | POA: Diagnosis not present

## 2022-11-10 DIAGNOSIS — I2782 Chronic pulmonary embolism: Secondary | ICD-10-CM | POA: Diagnosis not present

## 2022-11-12 DIAGNOSIS — R2681 Unsteadiness on feet: Secondary | ICD-10-CM | POA: Diagnosis not present

## 2022-11-12 DIAGNOSIS — R2689 Other abnormalities of gait and mobility: Secondary | ICD-10-CM | POA: Diagnosis not present

## 2022-11-12 DIAGNOSIS — S72142D Displaced intertrochanteric fracture of left femur, subsequent encounter for closed fracture with routine healing: Secondary | ICD-10-CM | POA: Diagnosis not present

## 2022-11-12 DIAGNOSIS — J449 Chronic obstructive pulmonary disease, unspecified: Secondary | ICD-10-CM | POA: Diagnosis not present

## 2022-11-12 DIAGNOSIS — M6281 Muscle weakness (generalized): Secondary | ICD-10-CM | POA: Diagnosis not present

## 2022-11-12 DIAGNOSIS — F329 Major depressive disorder, single episode, unspecified: Secondary | ICD-10-CM | POA: Diagnosis not present

## 2022-11-13 DIAGNOSIS — E039 Hypothyroidism, unspecified: Secondary | ICD-10-CM | POA: Diagnosis not present

## 2022-11-13 DIAGNOSIS — R300 Dysuria: Secondary | ICD-10-CM | POA: Diagnosis not present

## 2022-11-13 DIAGNOSIS — S72142D Displaced intertrochanteric fracture of left femur, subsequent encounter for closed fracture with routine healing: Secondary | ICD-10-CM | POA: Diagnosis not present

## 2022-11-13 DIAGNOSIS — I2782 Chronic pulmonary embolism: Secondary | ICD-10-CM | POA: Diagnosis not present

## 2022-11-16 DIAGNOSIS — R2689 Other abnormalities of gait and mobility: Secondary | ICD-10-CM | POA: Diagnosis not present

## 2022-11-16 DIAGNOSIS — J449 Chronic obstructive pulmonary disease, unspecified: Secondary | ICD-10-CM | POA: Diagnosis not present

## 2022-11-16 DIAGNOSIS — I2782 Chronic pulmonary embolism: Secondary | ICD-10-CM | POA: Diagnosis not present

## 2022-11-16 DIAGNOSIS — R300 Dysuria: Secondary | ICD-10-CM | POA: Diagnosis not present

## 2022-11-16 DIAGNOSIS — R2681 Unsteadiness on feet: Secondary | ICD-10-CM | POA: Diagnosis not present

## 2022-11-16 DIAGNOSIS — I479 Paroxysmal tachycardia, unspecified: Secondary | ICD-10-CM | POA: Diagnosis not present

## 2022-11-16 DIAGNOSIS — F329 Major depressive disorder, single episode, unspecified: Secondary | ICD-10-CM | POA: Diagnosis not present

## 2022-11-16 DIAGNOSIS — S72142D Displaced intertrochanteric fracture of left femur, subsequent encounter for closed fracture with routine healing: Secondary | ICD-10-CM | POA: Diagnosis not present

## 2022-11-16 DIAGNOSIS — M6281 Muscle weakness (generalized): Secondary | ICD-10-CM | POA: Diagnosis not present

## 2022-11-17 ENCOUNTER — Institutional Professional Consult (permissible substitution) (INDEPENDENT_AMBULATORY_CARE_PROVIDER_SITE_OTHER): Payer: Medicare PPO | Admitting: Audiology

## 2022-11-17 ENCOUNTER — Ambulatory Visit (INDEPENDENT_AMBULATORY_CARE_PROVIDER_SITE_OTHER): Payer: Medicare PPO | Admitting: Otolaryngology

## 2022-11-17 ENCOUNTER — Other Ambulatory Visit (INDEPENDENT_AMBULATORY_CARE_PROVIDER_SITE_OTHER): Payer: Medicare PPO

## 2022-11-17 ENCOUNTER — Ambulatory Visit (INDEPENDENT_AMBULATORY_CARE_PROVIDER_SITE_OTHER): Payer: Medicare PPO | Admitting: Orthopaedic Surgery

## 2022-11-17 ENCOUNTER — Telehealth: Payer: Self-pay

## 2022-11-17 ENCOUNTER — Encounter: Payer: Self-pay | Admitting: Orthopaedic Surgery

## 2022-11-17 DIAGNOSIS — S72142A Displaced intertrochanteric fracture of left femur, initial encounter for closed fracture: Secondary | ICD-10-CM

## 2022-11-17 NOTE — Telephone Encounter (Signed)
[facility] called stated patient had excessive drainage from surgical site. Patient worked in today with Dr Roda Shutters

## 2022-11-17 NOTE — Progress Notes (Signed)
Post-Op Visit Note   Patient: [name]           Date of Birth: [birth year]           MRN: [id number] Visit Date: 11/17/2022 PCP: [name], NP   Assessment & Plan:  Chief Complaint:  Chief Complaint  Patient presents with   Left Hip - Follow-up    IM nailing of left femur 11/02/2022   Visit Diagnoses:  1. Closed comminuted intertrochanteric fracture of proximal femur, left, initial encounter [facility])     Plan: Tone is a 2 weeks status post left intertrochanteric hip fracture.  She comes in today for wound recheck.  She has been experiencing drainage from the proximal incision.  Denies any constitutional symptoms or worsening pain.  She is at her facility currently.  Exam of the left hip shows dark bloody drainage from the most proximal incision.  There is no signs of infection or cellulitis.  The drainage is consistent with postsurgical hematoma.  Was able to express a lot of the drainage out of the proximal incision.  None of which looks infected.  I think this is just mobilization of the postsurgical hematoma.  Would like to put her on doxycycline twice a day prophylactically.  I would recommend dropping the Eliquis to once a day until the drainage stops.  I have written orders for the facility to change the dry dressing as often as it needs to be.  Recheck in 2 weeks.  Follow-Up Instructions: Return in about 2 weeks (around 12/01/2022) for lindsey.   Orders:  Orders Placed This Encounter  Procedures   XR FEMUR MIN 2 VIEWS LEFT   No orders of the defined types were placed in this encounter.   Imaging: XR FEMUR MIN 2 VIEWS LEFT  Result Date: 11/17/2022 X-rays of the left femur demonstrate stable alignment fixation of intertrochanteric fracture   PMFS History: Patient Active Problem List   Diagnosis Date Noted   Closed comminuted intertrochanteric fracture of proximal femur, left, initial encounter (HCC) 10/31/2022   Accidental fall 10/31/2022    Chronic anticoagulation 10/31/2022   History of pulmonary embolism 10/31/2022   History of PAT (paroxysmal atrial tachycardia) 10/31/2022   Closed left hip fracture, initial encounter (HCC) 10/31/2022   Acute pancreatitis 06/13/2022   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/26/2022   Acquired hypercoagulable state (HCC) 01/26/2022   Bilateral hearing loss 11/04/2020   Diabetes mellitus without complication (HCC) 11/04/2020   RLS (restless legs syndrome) 11/04/2020   OSA (obstructive sleep apnea) 08/29/2019   Abnormal findings on diagnostic imaging of lung 08/29/2019   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   Pulmonary embolism (HCC) 02/07/2019   GERD without esophagitis 05/15/2017   Dyslipidemia 05/15/2017   Anxiety 05/15/2017   Constipation due to opioid therapy 05/15/2017   Accidental fall from chair, initial encounter 05/08/2017   Closed posttraumatic compression fracture of lumbar vertebra (HCC) 05/08/2017   Leukocytosis 05/08/2017   S/P right TKA 07/10/2014   S/P knee replacement 07/10/2014   COPD (chronic obstructive pulmonary disease) (HCC) 09/24/2013   Hypothyroidism 07/24/2012   Essential hypertension, benign 07/24/2012   Allergic rhinitis 07/24/2012   Mixed hyperlipidemia 07/24/2012   Past Medical History:  Diagnosis Date   Actinic keratosis    Arthritis    Asthma    Back injury 2019   Fracture   Basal cell carcinoma    COPD (chronic obstructive pulmonary disease) (HCC)    GERD (gastroesophageal reflux disease)  H/O blood clots 2020   Lung, Per [facility] New Patient Packet   H/O mammogram 2022   Per [facility] New Patient Packet   Heart murmur    hx of in childhood    High cholesterol    Hyperlipidemia    Hypertension    Hypothyroidism    Kidney disease    Per [facility] New Patient Packet   MCNS (minimal change nephrotic syndrome)    OSA (obstructive sleep apnea) 08/29/2019   Osteopenia    Shortness of breath dyspnea    on exertion    Squamous cell carcinoma     Thyroid disease    Per [facility] New Patient Packet    Family History  Problem Relation Age of Onset   COPD Mother        Husband Smoked    Lymphoma Father    Hypertension Sister    Scoliosis Daughter    Stroke Maternal Grandfather    Rheumatic fever Paternal Grandmother    Heart disease Paternal Grandfather     Past Surgical History:  Procedure Laterality Date   ABDOMINAL HYSTERECTOMY     APPENDECTOMY     BILATERAL SALPINGOOPHORECTOMY     Ovarian Cysts    CHOLECYSTECTOMY N/A 06/17/2022   Procedure: LAPAROSCOPIC CHOLECYSTECTOMY;  Surgeon: Fritzi Mandes, MD;  Location: [facility];  Service: General;  Laterality: N/A;   COLONOSCOPY  2001   Dr.Mann, Per [facility] New Patient Packet   CONVERSION TO TOTAL KNEE Right 07/10/2014   Procedure: RIGHT CONVERSION OF PARTIAL KNEE TO TOTAL KNEE;  Surgeon: [name], MD;  Location: [facility];  Service: Orthopedics;  Laterality: Right;   ENDOSCOPIC RETROGRADE CHOLANGIOPANCREATOGRAPHY (ERCP) WITH PROPOFOL N/A 06/15/2022   Procedure: ENDOSCOPIC RETROGRADE CHOLANGIOPANCREATOGRAPHY (ERCP) WITH PROPOFOL;  Surgeon: Vida Rigger, MD;  Location: [facility];  Service: Gastroenterology;  Laterality: N/A;   INTRAMEDULLARY (IM) NAIL INTERTROCHANTERIC Left 11/02/2022   Procedure: INTRAMEDULLARY NAILING OF LEFT FEMUR;  Surgeon: Tarry Kos, MD;  Location: [facility];  Service: Orthopedics;  Laterality: Left;   IR ANGIOGRAM PULMONARY BILATERAL SELECTIVE  02/08/2019   IR ANGIOGRAM SELECTIVE EACH ADDITIONAL VESSEL  02/08/2019   IR ANGIOGRAM SELECTIVE EACH ADDITIONAL VESSEL  02/08/2019   IR INFUSION THROMBOL ARTERIAL INITIAL (MS)  02/08/2019   IR INFUSION THROMBOL ARTERIAL INITIAL (MS)  02/08/2019   IR THROMB F/U EVAL ART/VEN FINAL DAY (MS)  02/09/2019   IR US GUIDE VASC ACCESS RIGHT  02/08/2019   MEDIAL PARTIAL KNEE REPLACEMENT Bilateral    MOHS SURGERY     x 2   REMOVAL OF STONES  06/15/2022   Procedure: REMOVAL OF STONES;  Surgeon: Vida Rigger, MD;  Location: [facility];   Service: Gastroenterology;;   RENAL BIOPSY     x 2   REPLACEMENT TOTAL KNEE  2012   Per [facility] New Patient Packet   ROTATOR CUFF REPAIR Left 2008   SPHINCTEROTOMY  06/15/2022   Procedure: SPHINCTEROTOMY;  Surgeon: Vida Rigger, MD;  Location: [facility];  Service: Gastroenterology;;   Social History   Occupational History   Occupation: TEACHER     Employer: [employer]    Comment: RETIRED   Tobacco Use   Smoking status: Former    Current packs/day: 0.00    Average packs/day: 1.5 packs/day for 25.0 years (37.5 ttl pk-yrs)    Types: Cigarettes    Start date: 01/13/1948    Quit date: 01/12/1973    Years since quitting: 49.8   Smokeless tobacco: Never  Vaping Use  Vaping status: Never Used  Substance and Sexual Activity   Alcohol use: Yes    Alcohol/week: 0.5 standard drinks of alcohol    Types: 1 Standard drinks or equivalent per week    Comment: She occasionally drinks a glass of wine.     Drug use: No   Sexual activity: Not Currently    Partners: Male

## 2022-11-19 DIAGNOSIS — R2689 Other abnormalities of gait and mobility: Secondary | ICD-10-CM | POA: Diagnosis not present

## 2022-11-19 DIAGNOSIS — S72142D Displaced intertrochanteric fracture of left femur, subsequent encounter for closed fracture with routine healing: Secondary | ICD-10-CM | POA: Diagnosis not present

## 2022-11-19 DIAGNOSIS — F329 Major depressive disorder, single episode, unspecified: Secondary | ICD-10-CM | POA: Diagnosis not present

## 2022-11-19 DIAGNOSIS — R2681 Unsteadiness on feet: Secondary | ICD-10-CM | POA: Diagnosis not present

## 2022-11-19 DIAGNOSIS — M6281 Muscle weakness (generalized): Secondary | ICD-10-CM | POA: Diagnosis not present

## 2022-11-19 DIAGNOSIS — J449 Chronic obstructive pulmonary disease, unspecified: Secondary | ICD-10-CM | POA: Diagnosis not present

## 2022-11-20 ENCOUNTER — Ambulatory Visit: Payer: Medicare PPO | Admitting: Physician Assistant

## 2022-11-23 DIAGNOSIS — R2689 Other abnormalities of gait and mobility: Secondary | ICD-10-CM | POA: Diagnosis not present

## 2022-11-23 DIAGNOSIS — S72142D Displaced intertrochanteric fracture of left femur, subsequent encounter for closed fracture with routine healing: Secondary | ICD-10-CM | POA: Diagnosis not present

## 2022-11-23 DIAGNOSIS — F329 Major depressive disorder, single episode, unspecified: Secondary | ICD-10-CM | POA: Diagnosis not present

## 2022-11-23 DIAGNOSIS — J449 Chronic obstructive pulmonary disease, unspecified: Secondary | ICD-10-CM | POA: Diagnosis not present

## 2022-11-23 DIAGNOSIS — M6281 Muscle weakness (generalized): Secondary | ICD-10-CM | POA: Diagnosis not present

## 2022-11-23 DIAGNOSIS — R2681 Unsteadiness on feet: Secondary | ICD-10-CM | POA: Diagnosis not present

## 2022-11-25 DIAGNOSIS — N390 Urinary tract infection, site not specified: Secondary | ICD-10-CM | POA: Diagnosis not present

## 2022-11-26 DIAGNOSIS — J449 Chronic obstructive pulmonary disease, unspecified: Secondary | ICD-10-CM | POA: Diagnosis not present

## 2022-11-26 DIAGNOSIS — M6281 Muscle weakness (generalized): Secondary | ICD-10-CM | POA: Diagnosis not present

## 2022-11-26 DIAGNOSIS — R2681 Unsteadiness on feet: Secondary | ICD-10-CM | POA: Diagnosis not present

## 2022-11-26 DIAGNOSIS — S72142D Displaced intertrochanteric fracture of left femur, subsequent encounter for closed fracture with routine healing: Secondary | ICD-10-CM | POA: Diagnosis not present

## 2022-11-26 DIAGNOSIS — R2689 Other abnormalities of gait and mobility: Secondary | ICD-10-CM | POA: Diagnosis not present

## 2022-11-26 DIAGNOSIS — F329 Major depressive disorder, single episode, unspecified: Secondary | ICD-10-CM | POA: Diagnosis not present

## 2022-11-28 DIAGNOSIS — M6281 Muscle weakness (generalized): Secondary | ICD-10-CM | POA: Diagnosis not present

## 2022-11-28 DIAGNOSIS — D649 Anemia, unspecified: Secondary | ICD-10-CM | POA: Diagnosis not present

## 2022-11-28 DIAGNOSIS — S72142D Displaced intertrochanteric fracture of left femur, subsequent encounter for closed fracture with routine healing: Secondary | ICD-10-CM | POA: Diagnosis not present

## 2022-11-28 DIAGNOSIS — I2782 Chronic pulmonary embolism: Secondary | ICD-10-CM | POA: Diagnosis not present

## 2022-11-30 DIAGNOSIS — S72142D Displaced intertrochanteric fracture of left femur, subsequent encounter for closed fracture with routine healing: Secondary | ICD-10-CM | POA: Diagnosis not present

## 2022-11-30 DIAGNOSIS — J449 Chronic obstructive pulmonary disease, unspecified: Secondary | ICD-10-CM | POA: Diagnosis not present

## 2022-11-30 DIAGNOSIS — R2681 Unsteadiness on feet: Secondary | ICD-10-CM | POA: Diagnosis not present

## 2022-11-30 DIAGNOSIS — M6281 Muscle weakness (generalized): Secondary | ICD-10-CM | POA: Diagnosis not present

## 2022-11-30 DIAGNOSIS — R2689 Other abnormalities of gait and mobility: Secondary | ICD-10-CM | POA: Diagnosis not present

## 2022-11-30 DIAGNOSIS — F329 Major depressive disorder, single episode, unspecified: Secondary | ICD-10-CM | POA: Diagnosis not present

## 2022-12-01 ENCOUNTER — Ambulatory Visit (INDEPENDENT_AMBULATORY_CARE_PROVIDER_SITE_OTHER): Payer: Medicare PPO | Admitting: Physician Assistant

## 2022-12-01 DIAGNOSIS — S72142A Displaced intertrochanteric fracture of left femur, initial encounter for closed fracture: Secondary | ICD-10-CM | POA: Diagnosis not present

## 2022-12-01 MED ORDER — TRAMADOL HCL 50 MG PO TABS: 50.0000 mg | ORAL_TABLET | Freq: Two times a day (BID) | ORAL | 1 refills | Status: DC | PRN

## 2022-12-01 NOTE — Progress Notes (Signed)
Post-Op Visit Note   Patient: [name]           Date of Birth: [birth year]           MRN: [id number] Visit Date: 12/01/2022 PCP: [name], NP   Assessment & Plan:  Chief Complaint:  Chief Complaint  Patient presents with   Left Hip - Follow-up    11/02/22 IM Nail Left femur   Visit Diagnoses:  1. Closed comminuted intertrochanteric fracture of proximal femur, left, initial encounter [facility])     Plan: Patient is a very pleasant 82 year old female who comes in today approximately 4 weeks status post left hip IM nail from an intertrochanteric femur fracture, date of surgery 11/02/2022.  She is currently residing at [facility].  At her initial postop visit she had drainage to the distal aspect of the left proximal incision.  Eliquis was dropped from twice daily to once daily.  She was placed on doxycycline prophylactically.  She is doing much better.  No longer having any drainage to the wound.  She is getting physical therapy at the facility.  She is primarily taking Tylenol for pain but does note occasional oxycodone at night.  She does tell me she has had dysuria and the facility has cultured this 3 times without any growth.  She has not been tested for yeast infection.  Examination of her left hip reveals a fully healed surgical scar.  There is a small scab to the distal aspect where there is initial drainage.  No erythema or signs of cellulitis or infection.  She is neurovascular intact distally.  This point, she may discontinue doxycycline.  She may resume her Eliquis twice daily for which she was taking for history of PE.  I recommended switching from oxycodone to tramadol and have also sent in prescription to her pharmacy for this as she should be going home Thursday.  She will follow-up in 4 weeks for repeat evaluation and x-rays of the left femur.  Call with concerns or questions in meantime.  Follow-Up Instructions: Return in about 4 weeks (around  12/29/2022).   Orders:  No orders of the defined types were placed in this encounter.  Meds ordered this encounter  Medications   traMADol (ULTRAM) 50 MG tablet    Sig: Take 1 tablet (50 mg total) by mouth every 12 (twelve) hours as needed.    Dispense:  30 tablet    Refill:  1    Imaging: No new imaging  PMFS History: Patient Active Problem List   Diagnosis Date Noted   Closed comminuted intertrochanteric fracture of proximal femur, left, initial encounter (HCC) 10/31/2022   Accidental fall 10/31/2022   Chronic anticoagulation 10/31/2022   History of pulmonary embolism 10/31/2022   History of PAT (paroxysmal atrial tachycardia) 10/31/2022   Closed left hip fracture, initial encounter (HCC) 10/31/2022   Acute pancreatitis 06/13/2022   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/26/2022   Acquired hypercoagulable state (HCC) 01/26/2022   Bilateral hearing loss 11/04/2020   Diabetes mellitus without complication (HCC) 11/04/2020   RLS (restless legs syndrome) 11/04/2020   OSA (obstructive sleep apnea) 08/29/2019   Abnormal findings on diagnostic imaging of lung 08/29/2019   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   Pulmonary embolism (HCC) 02/07/2019   GERD without esophagitis 05/15/2017   Dyslipidemia 05/15/2017   Anxiety 05/15/2017   Constipation due to opioid therapy 05/15/2017   Accidental fall from chair, initial encounter 05/08/2017   Closed posttraumatic  compression fracture of lumbar vertebra (HCC) 05/08/2017   Leukocytosis 05/08/2017   S/P right TKA 07/10/2014   S/P knee replacement 07/10/2014   COPD (chronic obstructive pulmonary disease) (HCC) 09/24/2013   Hypothyroidism 07/24/2012   Essential hypertension, benign 07/24/2012   Allergic rhinitis 07/24/2012   Mixed hyperlipidemia 07/24/2012   Past Medical History:  Diagnosis Date   Actinic keratosis    Arthritis    Asthma    Back injury 2019   Fracture   Basal cell carcinoma    COPD (chronic  obstructive pulmonary disease) (HCC)    GERD (gastroesophageal reflux disease)    H/O blood clots 2020   Lung, Per [facility] New Patient Packet   H/O mammogram 2022   Per [facility] New Patient Packet   Heart murmur    hx of in childhood    High cholesterol    Hyperlipidemia    Hypertension    Hypothyroidism    Kidney disease    Per [facility] New Patient Packet   MCNS (minimal change nephrotic syndrome)    OSA (obstructive sleep apnea) 08/29/2019   Osteopenia    Shortness of breath dyspnea    on exertion    Squamous cell carcinoma    Thyroid disease    Per [facility] New Patient Packet    Family History  Problem Relation Age of Onset   COPD Mother        Husband Smoked    Lymphoma Father    Hypertension Sister    Scoliosis Daughter    Stroke Maternal Grandfather    Rheumatic fever Paternal Grandmother    Heart disease Paternal Grandfather     Past Surgical History:  Procedure Laterality Date   ABDOMINAL HYSTERECTOMY     APPENDECTOMY     BILATERAL SALPINGOOPHORECTOMY     Ovarian Cysts    CHOLECYSTECTOMY N/A 06/17/2022   Procedure: LAPAROSCOPIC CHOLECYSTECTOMY;  Surgeon: Fritzi Mandes, MD;  Location: [facility];  Service: General;  Laterality: N/A;   COLONOSCOPY  2001   Dr.Mann, Per [facility] New Patient Packet   CONVERSION TO TOTAL KNEE Right 07/10/2014   Procedure: RIGHT CONVERSION OF PARTIAL KNEE TO TOTAL KNEE;  Surgeon: [name], MD;  Location: [facility];  Service: Orthopedics;  Laterality: Right;   ENDOSCOPIC RETROGRADE CHOLANGIOPANCREATOGRAPHY (ERCP) WITH PROPOFOL N/A 06/15/2022   Procedure: ENDOSCOPIC RETROGRADE CHOLANGIOPANCREATOGRAPHY (ERCP) WITH PROPOFOL;  Surgeon: Vida Rigger, MD;  [facility];  Service: Gastroenterology;  Laterality: N/A;   INTRAMEDULLARY (IM) NAIL INTERTROCHANTERIC Left 11/02/2022   Procedure: INTRAMEDULLARY NAILING OF LEFT FEMUR;  Surgeon: Tarry Kos, MD;  Location: [facility];  Service: Orthopedics;  Laterality: Left;   IR ANGIOGRAM PULMONARY BILATERAL SELECTIVE   02/08/2019   IR ANGIOGRAM SELECTIVE EACH ADDITIONAL VESSEL  02/08/2019   IR ANGIOGRAM SELECTIVE EACH ADDITIONAL VESSEL  02/08/2019   IR INFUSION THROMBOL ARTERIAL INITIAL (MS)  02/08/2019   IR INFUSION THROMBOL ARTERIAL INITIAL (MS)  02/08/2019   IR THROMB F/U EVAL ART/VEN FINAL DAY (MS)  02/09/2019   IR US GUIDE VASC ACCESS RIGHT  02/08/2019   MEDIAL PARTIAL KNEE REPLACEMENT Bilateral    MOHS SURGERY     x 2   REMOVAL OF STONES  06/15/2022   Procedure: REMOVAL OF STONES;  Surgeon: Vida Rigger, MD;  [facility]: [facility];  Service: Gastroenterology;;   RENAL BIOPSY     x 2   REPLACEMENT TOTAL KNEE  2012   Per [facility] New Patient Packet   ROTATOR CUFF REPAIR Left 2008   SPHINCTEROTOMY  06/15/2022   Procedure: SPHINCTEROTOMY;  Surgeon: Vida Rigger, MD;  Location: [facility];  Service: Gastroenterology;;   Social History   Occupational History   Occupation: TEACHER     Employer: [employer]    Comment: RETIRED   Tobacco Use   Smoking status: Former    Current packs/day: 0.00    Average packs/day: 1.5 packs/day for 25.0 years (37.5 ttl pk-yrs)    Types: Cigarettes    Start date: 01/13/1948    Quit date: 01/12/1973    Years since quitting: 49.9   Smokeless tobacco: Never  Vaping Use   Vaping status: Never Used  Substance and Sexual Activity   Alcohol use: Yes    Alcohol/week: 0.5 standard drinks of alcohol    Types: 1 Standard drinks or equivalent per week    Comment: She occasionally drinks a glass of wine.     Drug use: No   Sexual activity: Not Currently    Partners: Male

## 2022-12-02 DIAGNOSIS — F320 Major depressive disorder, single episode, mild: Secondary | ICD-10-CM | POA: Diagnosis not present

## 2022-12-02 DIAGNOSIS — I2782 Chronic pulmonary embolism: Secondary | ICD-10-CM | POA: Diagnosis not present

## 2022-12-02 DIAGNOSIS — S72142D Displaced intertrochanteric fracture of left femur, subsequent encounter for closed fracture with routine healing: Secondary | ICD-10-CM | POA: Diagnosis not present

## 2022-12-02 DIAGNOSIS — N390 Urinary tract infection, site not specified: Secondary | ICD-10-CM | POA: Diagnosis not present

## 2022-12-05 DIAGNOSIS — M6281 Muscle weakness (generalized): Secondary | ICD-10-CM | POA: Diagnosis not present

## 2022-12-05 DIAGNOSIS — G4733 Obstructive sleep apnea (adult) (pediatric): Secondary | ICD-10-CM | POA: Diagnosis not present

## 2022-12-05 DIAGNOSIS — E1122 Type 2 diabetes mellitus with diabetic chronic kidney disease: Secondary | ICD-10-CM | POA: Diagnosis not present

## 2022-12-05 DIAGNOSIS — I4719 Other supraventricular tachycardia: Secondary | ICD-10-CM | POA: Diagnosis not present

## 2022-12-05 DIAGNOSIS — J4489 Other specified chronic obstructive pulmonary disease: Secondary | ICD-10-CM | POA: Diagnosis not present

## 2022-12-05 DIAGNOSIS — N189 Chronic kidney disease, unspecified: Secondary | ICD-10-CM | POA: Diagnosis not present

## 2022-12-05 DIAGNOSIS — E0590 Thyrotoxicosis, unspecified without thyrotoxic crisis or storm: Secondary | ICD-10-CM | POA: Diagnosis not present

## 2022-12-05 DIAGNOSIS — I129 Hypertensive chronic kidney disease with stage 1 through stage 4 chronic kidney disease, or unspecified chronic kidney disease: Secondary | ICD-10-CM | POA: Diagnosis not present

## 2022-12-05 DIAGNOSIS — F32A Depression, unspecified: Secondary | ICD-10-CM | POA: Diagnosis not present

## 2022-12-05 DIAGNOSIS — S72142D Displaced intertrochanteric fracture of left femur, subsequent encounter for closed fracture with routine healing: Secondary | ICD-10-CM | POA: Diagnosis not present

## 2022-12-07 DIAGNOSIS — J4489 Other specified chronic obstructive pulmonary disease: Secondary | ICD-10-CM | POA: Diagnosis not present

## 2022-12-07 DIAGNOSIS — I4719 Other supraventricular tachycardia: Secondary | ICD-10-CM | POA: Diagnosis not present

## 2022-12-07 DIAGNOSIS — F32A Depression, unspecified: Secondary | ICD-10-CM | POA: Diagnosis not present

## 2022-12-07 DIAGNOSIS — E1122 Type 2 diabetes mellitus with diabetic chronic kidney disease: Secondary | ICD-10-CM | POA: Diagnosis not present

## 2022-12-07 DIAGNOSIS — N189 Chronic kidney disease, unspecified: Secondary | ICD-10-CM | POA: Diagnosis not present

## 2022-12-07 DIAGNOSIS — I129 Hypertensive chronic kidney disease with stage 1 through stage 4 chronic kidney disease, or unspecified chronic kidney disease: Secondary | ICD-10-CM | POA: Diagnosis not present

## 2022-12-07 DIAGNOSIS — G4733 Obstructive sleep apnea (adult) (pediatric): Secondary | ICD-10-CM | POA: Diagnosis not present

## 2022-12-07 DIAGNOSIS — E0590 Thyrotoxicosis, unspecified without thyrotoxic crisis or storm: Secondary | ICD-10-CM | POA: Diagnosis not present

## 2022-12-07 DIAGNOSIS — S72142D Displaced intertrochanteric fracture of left femur, subsequent encounter for closed fracture with routine healing: Secondary | ICD-10-CM | POA: Diagnosis not present

## 2022-12-08 ENCOUNTER — Other Ambulatory Visit: Payer: Self-pay | Admitting: Nurse Practitioner

## 2022-12-08 DIAGNOSIS — F32A Depression, unspecified: Secondary | ICD-10-CM | POA: Diagnosis not present

## 2022-12-08 DIAGNOSIS — E0590 Thyrotoxicosis, unspecified without thyrotoxic crisis or storm: Secondary | ICD-10-CM | POA: Diagnosis not present

## 2022-12-08 DIAGNOSIS — N189 Chronic kidney disease, unspecified: Secondary | ICD-10-CM | POA: Diagnosis not present

## 2022-12-08 DIAGNOSIS — J4489 Other specified chronic obstructive pulmonary disease: Secondary | ICD-10-CM | POA: Diagnosis not present

## 2022-12-08 DIAGNOSIS — E1122 Type 2 diabetes mellitus with diabetic chronic kidney disease: Secondary | ICD-10-CM | POA: Diagnosis not present

## 2022-12-08 DIAGNOSIS — S72142D Displaced intertrochanteric fracture of left femur, subsequent encounter for closed fracture with routine healing: Secondary | ICD-10-CM | POA: Diagnosis not present

## 2022-12-08 DIAGNOSIS — I4719 Other supraventricular tachycardia: Secondary | ICD-10-CM | POA: Diagnosis not present

## 2022-12-08 DIAGNOSIS — M810 Age-related osteoporosis without current pathological fracture: Secondary | ICD-10-CM

## 2022-12-08 DIAGNOSIS — I129 Hypertensive chronic kidney disease with stage 1 through stage 4 chronic kidney disease, or unspecified chronic kidney disease: Secondary | ICD-10-CM | POA: Diagnosis not present

## 2022-12-08 DIAGNOSIS — G4733 Obstructive sleep apnea (adult) (pediatric): Secondary | ICD-10-CM | POA: Diagnosis not present

## 2022-12-08 MED ORDER — DENOSUMAB 60 MG/ML ~~LOC~~ SOSY
60.0000 mg | PREFILLED_SYRINGE | Freq: Once | SUBCUTANEOUS | Status: AC
  Administered 2023-01-04: 60 mg via SUBCUTANEOUS

## 2022-12-09 ENCOUNTER — Ambulatory Visit: Payer: Medicare PPO | Admitting: Family

## 2022-12-10 DIAGNOSIS — E0590 Thyrotoxicosis, unspecified without thyrotoxic crisis or storm: Secondary | ICD-10-CM | POA: Diagnosis not present

## 2022-12-10 DIAGNOSIS — G4733 Obstructive sleep apnea (adult) (pediatric): Secondary | ICD-10-CM | POA: Diagnosis not present

## 2022-12-10 DIAGNOSIS — S72142D Displaced intertrochanteric fracture of left femur, subsequent encounter for closed fracture with routine healing: Secondary | ICD-10-CM | POA: Diagnosis not present

## 2022-12-10 DIAGNOSIS — J4489 Other specified chronic obstructive pulmonary disease: Secondary | ICD-10-CM | POA: Diagnosis not present

## 2022-12-10 DIAGNOSIS — I4719 Other supraventricular tachycardia: Secondary | ICD-10-CM | POA: Diagnosis not present

## 2022-12-10 DIAGNOSIS — N189 Chronic kidney disease, unspecified: Secondary | ICD-10-CM | POA: Diagnosis not present

## 2022-12-10 DIAGNOSIS — I129 Hypertensive chronic kidney disease with stage 1 through stage 4 chronic kidney disease, or unspecified chronic kidney disease: Secondary | ICD-10-CM | POA: Diagnosis not present

## 2022-12-10 DIAGNOSIS — E1122 Type 2 diabetes mellitus with diabetic chronic kidney disease: Secondary | ICD-10-CM | POA: Diagnosis not present

## 2022-12-10 DIAGNOSIS — F32A Depression, unspecified: Secondary | ICD-10-CM | POA: Diagnosis not present

## 2022-12-15 DIAGNOSIS — E1122 Type 2 diabetes mellitus with diabetic chronic kidney disease: Secondary | ICD-10-CM | POA: Diagnosis not present

## 2022-12-15 DIAGNOSIS — I129 Hypertensive chronic kidney disease with stage 1 through stage 4 chronic kidney disease, or unspecified chronic kidney disease: Secondary | ICD-10-CM | POA: Diagnosis not present

## 2022-12-15 DIAGNOSIS — F32A Depression, unspecified: Secondary | ICD-10-CM | POA: Diagnosis not present

## 2022-12-15 DIAGNOSIS — S72142D Displaced intertrochanteric fracture of left femur, subsequent encounter for closed fracture with routine healing: Secondary | ICD-10-CM | POA: Diagnosis not present

## 2022-12-15 DIAGNOSIS — J4489 Other specified chronic obstructive pulmonary disease: Secondary | ICD-10-CM | POA: Diagnosis not present

## 2022-12-15 DIAGNOSIS — E0590 Thyrotoxicosis, unspecified without thyrotoxic crisis or storm: Secondary | ICD-10-CM | POA: Diagnosis not present

## 2022-12-15 DIAGNOSIS — N189 Chronic kidney disease, unspecified: Secondary | ICD-10-CM | POA: Diagnosis not present

## 2022-12-15 DIAGNOSIS — I4719 Other supraventricular tachycardia: Secondary | ICD-10-CM | POA: Diagnosis not present

## 2022-12-15 DIAGNOSIS — G4733 Obstructive sleep apnea (adult) (pediatric): Secondary | ICD-10-CM | POA: Diagnosis not present

## 2022-12-17 ENCOUNTER — Ambulatory Visit: Payer: Medicare PPO | Admitting: Nurse Practitioner

## 2022-12-17 DIAGNOSIS — I129 Hypertensive chronic kidney disease with stage 1 through stage 4 chronic kidney disease, or unspecified chronic kidney disease: Secondary | ICD-10-CM | POA: Diagnosis not present

## 2022-12-17 DIAGNOSIS — E1122 Type 2 diabetes mellitus with diabetic chronic kidney disease: Secondary | ICD-10-CM | POA: Diagnosis not present

## 2022-12-17 DIAGNOSIS — E0590 Thyrotoxicosis, unspecified without thyrotoxic crisis or storm: Secondary | ICD-10-CM | POA: Diagnosis not present

## 2022-12-17 DIAGNOSIS — I4719 Other supraventricular tachycardia: Secondary | ICD-10-CM | POA: Diagnosis not present

## 2022-12-17 DIAGNOSIS — F32A Depression, unspecified: Secondary | ICD-10-CM | POA: Diagnosis not present

## 2022-12-17 DIAGNOSIS — J4489 Other specified chronic obstructive pulmonary disease: Secondary | ICD-10-CM | POA: Diagnosis not present

## 2022-12-17 DIAGNOSIS — N189 Chronic kidney disease, unspecified: Secondary | ICD-10-CM | POA: Diagnosis not present

## 2022-12-17 DIAGNOSIS — S72142D Displaced intertrochanteric fracture of left femur, subsequent encounter for closed fracture with routine healing: Secondary | ICD-10-CM | POA: Diagnosis not present

## 2022-12-17 DIAGNOSIS — G4733 Obstructive sleep apnea (adult) (pediatric): Secondary | ICD-10-CM | POA: Diagnosis not present

## 2022-12-18 ENCOUNTER — Telehealth: Payer: Self-pay | Admitting: *Deleted

## 2022-12-18 DIAGNOSIS — S72142D Displaced intertrochanteric fracture of left femur, subsequent encounter for closed fracture with routine healing: Secondary | ICD-10-CM | POA: Diagnosis not present

## 2022-12-18 DIAGNOSIS — I129 Hypertensive chronic kidney disease with stage 1 through stage 4 chronic kidney disease, or unspecified chronic kidney disease: Secondary | ICD-10-CM | POA: Diagnosis not present

## 2022-12-18 DIAGNOSIS — N189 Chronic kidney disease, unspecified: Secondary | ICD-10-CM | POA: Diagnosis not present

## 2022-12-18 DIAGNOSIS — F32A Depression, unspecified: Secondary | ICD-10-CM | POA: Diagnosis not present

## 2022-12-18 DIAGNOSIS — J4489 Other specified chronic obstructive pulmonary disease: Secondary | ICD-10-CM | POA: Diagnosis not present

## 2022-12-18 DIAGNOSIS — E1122 Type 2 diabetes mellitus with diabetic chronic kidney disease: Secondary | ICD-10-CM | POA: Diagnosis not present

## 2022-12-18 DIAGNOSIS — I4719 Other supraventricular tachycardia: Secondary | ICD-10-CM | POA: Diagnosis not present

## 2022-12-18 DIAGNOSIS — G4733 Obstructive sleep apnea (adult) (pediatric): Secondary | ICD-10-CM | POA: Diagnosis not present

## 2022-12-18 DIAGNOSIS — E0590 Thyrotoxicosis, unspecified without thyrotoxic crisis or storm: Secondary | ICD-10-CM | POA: Diagnosis not present

## 2022-12-18 NOTE — Telephone Encounter (Signed)
Received fax from [facility] #[phone] stating a Previous request for Prolia was submitted and has been APPROVED. The authorization [id number] is good from 02/10/2023-02/09/2024  Request has been APPROVED and extended for the next Calendar year  Authorization Fax Sent to [employer].

## 2022-12-21 DIAGNOSIS — J4489 Other specified chronic obstructive pulmonary disease: Secondary | ICD-10-CM | POA: Diagnosis not present

## 2022-12-21 DIAGNOSIS — I4719 Other supraventricular tachycardia: Secondary | ICD-10-CM | POA: Diagnosis not present

## 2022-12-21 DIAGNOSIS — E0590 Thyrotoxicosis, unspecified without thyrotoxic crisis or storm: Secondary | ICD-10-CM | POA: Diagnosis not present

## 2022-12-21 DIAGNOSIS — N189 Chronic kidney disease, unspecified: Secondary | ICD-10-CM | POA: Diagnosis not present

## 2022-12-21 DIAGNOSIS — F32A Depression, unspecified: Secondary | ICD-10-CM | POA: Diagnosis not present

## 2022-12-21 DIAGNOSIS — G4733 Obstructive sleep apnea (adult) (pediatric): Secondary | ICD-10-CM | POA: Diagnosis not present

## 2022-12-21 DIAGNOSIS — E1122 Type 2 diabetes mellitus with diabetic chronic kidney disease: Secondary | ICD-10-CM | POA: Diagnosis not present

## 2022-12-21 DIAGNOSIS — I129 Hypertensive chronic kidney disease with stage 1 through stage 4 chronic kidney disease, or unspecified chronic kidney disease: Secondary | ICD-10-CM | POA: Diagnosis not present

## 2022-12-21 DIAGNOSIS — S72142D Displaced intertrochanteric fracture of left femur, subsequent encounter for closed fracture with routine healing: Secondary | ICD-10-CM | POA: Diagnosis not present

## 2022-12-23 ENCOUNTER — Encounter: Payer: Self-pay | Admitting: Nurse Practitioner

## 2022-12-23 ENCOUNTER — Ambulatory Visit: Payer: Medicare PPO | Admitting: Nurse Practitioner

## 2022-12-23 VITALS — BP 122/78 | HR 73 | Temp 97.1°F | Resp 18 | Ht 65.0 in | Wt 211.6 lb

## 2022-12-23 DIAGNOSIS — E7800 Pure hypercholesterolemia, unspecified: Secondary | ICD-10-CM | POA: Diagnosis not present

## 2022-12-23 DIAGNOSIS — N1832 Chronic kidney disease, stage 3b: Secondary | ICD-10-CM

## 2022-12-23 DIAGNOSIS — E034 Atrophy of thyroid (acquired): Secondary | ICD-10-CM | POA: Diagnosis not present

## 2022-12-23 DIAGNOSIS — S72142D Displaced intertrochanteric fracture of left femur, subsequent encounter for closed fracture with routine healing: Secondary | ICD-10-CM

## 2022-12-23 DIAGNOSIS — E0590 Thyrotoxicosis, unspecified without thyrotoxic crisis or storm: Secondary | ICD-10-CM | POA: Diagnosis not present

## 2022-12-23 DIAGNOSIS — D508 Other iron deficiency anemias: Secondary | ICD-10-CM

## 2022-12-23 DIAGNOSIS — G629 Polyneuropathy, unspecified: Secondary | ICD-10-CM | POA: Diagnosis not present

## 2022-12-23 DIAGNOSIS — G4733 Obstructive sleep apnea (adult) (pediatric): Secondary | ICD-10-CM | POA: Diagnosis not present

## 2022-12-23 DIAGNOSIS — G2581 Restless legs syndrome: Secondary | ICD-10-CM | POA: Diagnosis not present

## 2022-12-23 DIAGNOSIS — E1122 Type 2 diabetes mellitus with diabetic chronic kidney disease: Secondary | ICD-10-CM

## 2022-12-23 DIAGNOSIS — N189 Chronic kidney disease, unspecified: Secondary | ICD-10-CM | POA: Diagnosis not present

## 2022-12-23 DIAGNOSIS — I4719 Other supraventricular tachycardia: Secondary | ICD-10-CM | POA: Diagnosis not present

## 2022-12-23 DIAGNOSIS — D6869 Other thrombophilia: Secondary | ICD-10-CM | POA: Diagnosis not present

## 2022-12-23 DIAGNOSIS — I129 Hypertensive chronic kidney disease with stage 1 through stage 4 chronic kidney disease, or unspecified chronic kidney disease: Secondary | ICD-10-CM | POA: Diagnosis not present

## 2022-12-23 DIAGNOSIS — J4489 Other specified chronic obstructive pulmonary disease: Secondary | ICD-10-CM | POA: Diagnosis not present

## 2022-12-23 DIAGNOSIS — F32A Depression, unspecified: Secondary | ICD-10-CM | POA: Diagnosis not present

## 2022-12-23 NOTE — Progress Notes (Signed)
Careteam: Patient Care Team: [name], NP as PCP - General (Geriatric Medicine) [name], [name], MD as PCP - Cardiology (Cardiology) [name], MD as Consulting Physician (Orthopedic Surgery) [name], MD as Consulting Physician (Dermatology) [name], MD as Referring Physician (Nephrology) [name], MD (Inactive) as Consulting Physician (Pulmonary Disease) [name] (Optometry)  PLACE OF SERVICE:  [facility]  Advanced Directive information    No Known Allergies  Chief Complaint  Patient presents with   Acute Visit    discharged from [facility] nursing facility. [facility] verified      HPI: Patient is a 82 y.o. female for hospital and rehab follow up.  She was getting the wreath out of her garage and twisted her leg and fell.  She knew she broke something when she fell.  Called EMS she was in severe pain. Noted to have intertrochanteric fracture and underwent a IM nailing of left femur on 11/02/2022.  She had a hard time with pain management. Pain 4/10. Taking tramadol and tylenol to control pain.  She is now working with PT/OT at home.   Anxiety- continues on celexa- controlled at this time. Has some anxiety with getting up and doing things alone.   She was going up and down steps at rehab but there was hand rails and she has a bar at home. Working with therapy on this.   Bowels moving well. No constipation.   Due for prolia at the end of the month   Increased requip to twice daily during rehab stay due to restless leg getting worse and effecting her incision   Review of Systems:  Review of Systems  Constitutional:  Negative for chills, fever and weight loss.  HENT:  Negative for tinnitus.   Respiratory:  Negative for cough, sputum production and shortness of breath.   Cardiovascular:  Negative for chest pain, palpitations and leg swelling.  Gastrointestinal:  Negative for abdominal pain, constipation, diarrhea and heartburn.   Genitourinary:  Negative for dysuria, frequency and urgency.  Musculoskeletal:  Positive for joint pain and myalgias. Negative for back pain and falls.  Skin: Negative.   Neurological:  Negative for dizziness and headaches.  Psychiatric/Behavioral:  Negative for depression and memory loss. The patient does not have insomnia.     Past Medical History:  Diagnosis Date   Actinic keratosis    Arthritis    Asthma    Back injury 2019   Fracture   Basal cell carcinoma    COPD (chronic obstructive pulmonary disease) (HCC)    GERD (gastroesophageal reflux disease)    H/O blood clots 2020   Lung, Per [facility]   H/O mammogram 2022   Per [facility] New Patient Packet   Heart murmur    hx of in childhood    High cholesterol    Hyperlipidemia    Hypertension    Hypothyroidism    Kidney disease    Per [facility] New Patient Packet   MCNS (minimal change nephrotic syndrome)    OSA (obstructive sleep apnea) 08/29/2019   Osteopenia    Shortness of breath dyspnea    on exertion    Squamous cell carcinoma    Thyroid disease    Per [facility] New Patient Packet   Past Surgical History:  Procedure Laterality Date   ABDOMINAL HYSTERECTOMY     APPENDECTOMY     BILATERAL SALPINGOOPHORECTOMY     Ovarian Cysts    CHOLECYSTECTOMY N/A 06/17/2022   Procedure: LAPAROSCOPIC  CHOLECYSTECTOMY;  Surgeon: Fritzi Mandes, MD;  [facility];  Service: General;  Laterality: N/A;   COLONOSCOPY  2001   Dr.Mann, Per [facility] New Patient Packet   CONVERSION TO TOTAL KNEE Right 07/10/2014   Procedure: RIGHT CONVERSION OF PARTIAL KNEE TO TOTAL KNEE;  Surgeon: [name], MD;  [facility];  Service: Orthopedics;  Laterality: Right;   ENDOSCOPIC RETROGRADE CHOLANGIOPANCREATOGRAPHY (ERCP) WITH PROPOFOL N/A 06/15/2022   Procedure: ENDOSCOPIC RETROGRADE CHOLANGIOPANCREATOGRAPHY (ERCP) WITH PROPOFOL;  Surgeon: Vida Rigger, MD;  [facility];  Service: Gastroenterology;  Laterality: N/A;   INTRAMEDULLARY (IM)  NAIL INTERTROCHANTERIC Left 11/02/2022   Procedure: INTRAMEDULLARY NAILING OF LEFT FEMUR;  Surgeon: Tarry Kos, MD;  [facility];  Service: Orthopedics;  Laterality: Left;   IR ANGIOGRAM PULMONARY BILATERAL SELECTIVE  02/08/2019   IR ANGIOGRAM SELECTIVE EACH ADDITIONAL VESSEL  02/08/2019   IR ANGIOGRAM SELECTIVE EACH ADDITIONAL VESSEL  02/08/2019   IR INFUSION THROMBOL ARTERIAL INITIAL (MS)  02/08/2019   IR INFUSION THROMBOL ARTERIAL INITIAL (MS)  02/08/2019   IR THROMB F/U EVAL ART/VEN FINAL DAY (MS)  02/09/2019   IR US GUIDE VASC ACCESS RIGHT  02/08/2019   MEDIAL PARTIAL KNEE REPLACEMENT Bilateral    MOHS SURGERY     x 2   REMOVAL OF STONES  06/15/2022   Procedure: REMOVAL OF STONES;  Surgeon: Vida Rigger, MD;  [facility];  Service: Gastroenterology;;   RENAL BIOPSY     x 2   REPLACEMENT TOTAL KNEE  2012   Per [facility] New Patient Packet   ROTATOR CUFF REPAIR Left 2008   SPHINCTEROTOMY  06/15/2022   Procedure: SPHINCTEROTOMY;  Surgeon: Vida Rigger, MD;  [facility];  Service: Gastroenterology;;   Social History:   reports that she quit smoking about 49 years ago. Her smoking use included cigarettes. She started smoking about 74 years ago. She has a 37.5 pack-year smoking history. She has never used smokeless tobacco. She reports current alcohol use of about 0.5 standard drinks of alcohol per week. She reports that she does not use drugs.  Family History  Problem Relation Age of Onset   COPD Mother        Husband Smoked    Lymphoma Father    Hypertension Sister    Scoliosis Daughter    Stroke Maternal Grandfather    Rheumatic fever Paternal Grandmother    Heart disease Paternal Grandfather     Medications: Patient's Medications  New Prescriptions   No medications on file  Previous Medications   ALBUTEROL (VENTOLIN HFA) 108 (90 BASE) MCG/ACT INHALER    Inhale 1-2 puffs into the lungs every 6 (six) hours as needed for wheezing or shortness of breath.    CHOLECALCIFEROL (VITAMIN D3 PO)    Take 1 capsule by mouth daily.   CITALOPRAM (CELEXA) 20 MG TABLET    Take 1 tablet (20 mg total) by mouth at bedtime. DX F41.9   DOCUSATE SODIUM (COLACE) 100 MG CAPSULE    Take 1 capsule (100 mg total) by mouth 2 (two) times daily.   DONEPEZIL (ARICEPT) 10 MG TABLET    TAKE 1 TABLET BY MOUTH EVERYDAY AT BEDTIME   ELIQUIS 5 MG TABS TABLET    TAKE 1 TABLET BY MOUTH TWICE A DAY   FERROUS SULFATE 325 (65 FE) MG EC TABLET    TAKE 1 TABLET BY MOUTH EVERY DAY WITH BREAKFAST   FLUTICASONE (FLONASE) 50 MCG/ACT NASAL SPRAY    SPRAY 1 SPRAY INTO  EACH NOSTRIL DAILY   GUAIFENESIN (MUCINEX) 600 MG 12 HR TABLET    Take 1,200 mg by mouth as needed for to loosen phlegm.   IPRATROPIUM (ATROVENT) 0.03 % NASAL SPRAY    SPRAY 2 SPRAYS INTO EACH NOSTRIL 3 TIMES A DAY AS NEEDED FOR RHINITIS   LEVOTHYROXINE (SYNTHROID) 88 MCG TABLET    TAKE 1 TABLET BY MOUTH EVERY DAY IN THE MORNING   LOSARTAN (COZAAR) 100 MG TABLET    TAKE 1 TABLET BY MOUTH EVERY DAY   MELATONIN 10 MG TABS    Take 10 mg by mouth at bedtime.   METFORMIN (GLUCOPHAGE) 500 MG TABLET    Take 1 tablet (500 mg total) by mouth daily with supper.   METOPROLOL TARTRATE (LOPRESSOR) 25 MG TABLET    Take 1 tablet (25 mg total) by mouth 2 (two) times daily.   MONTELUKAST (SINGULAIR) 10 MG TABLET    TAKE 1 TABLET BY MOUTH EVERYDAY AT BEDTIME   MULTIPLE VITAMIN (MULTIVITAMIN WITH MINERALS) TABS TABLET    Take 1 tablet by mouth daily with breakfast.   OXYCODONE (OXY IR/ROXICODONE) 5 MG IMMEDIATE RELEASE TABLET    Take 1-2 tablets (5-10 mg total) by mouth every 8 (eight) hours as needed for severe pain.   PANTOPRAZOLE (PROTONIX) 40 MG TABLET    TAKE 1 TABLET BY MOUTH EVERY DAY   ROPINIROLE (REQUIP) 1 MG TABLET    TAKE 1 TABLET BY MOUTH EVERYDAY AT BEDTIME   ROSUVASTATIN (CRESTOR) 20 MG TABLET    Take 1 tablet (20 mg total) by mouth daily.   TACROLIMUS (PROGRAF) 1 MG CAPSULE    Take 1 mg by mouth in the morning and at bedtime.    TRAMADOL (ULTRAM) 50 MG TABLET    Take 1 tablet (50 mg total) by mouth every 12 (twelve) hours as needed.   TRELEGY ELLIPTA 100-62.5-25 MCG/ACT AEPB    INHALE 1 PUFF BY MOUTH EVERY DAY   TYLENOL 325 MG CAPS    Take 325-650 mg by mouth every 6 (six) hours as needed (for pain or headaches).  Modified Medications   No medications on file  Discontinued Medications   No medications on file    Physical Exam:  Vitals:   12/23/22 1429  BP: 122/78  Pulse: 73  Resp: 18  Temp: (!) 97.1 F (36.2 C)  SpO2: 92%  Weight: 211 lb 9.6 oz (96 kg)  Height: 5\' 5"  (1.651 m)   Body mass index is 35.21 kg/m. Wt Readings from Last 3 Encounters:  12/23/22 211 lb 9.6 oz (96 kg)  11/06/22 224 lb 6.9 oz (101.8 kg)  10/13/22 215 lb (97.5 kg)    Physical Exam Constitutional:      General: She is not in acute distress.    Appearance: She is well-developed. She is not diaphoretic.  HENT:     Head: Normocephalic and atraumatic.     Mouth/Throat:     Pharynx: No oropharyngeal exudate.  Eyes:     Conjunctiva/sclera: Conjunctivae normal.     Pupils: Pupils are equal, round, and reactive to light.  Cardiovascular:     Rate and Rhythm: Normal rate and regular rhythm.     Heart sounds: Normal heart sounds.  Pulmonary:     Effort: Pulmonary effort is normal.     Breath sounds: Normal breath sounds.  Abdominal:     General: Bowel sounds are normal.     Palpations: Abdomen is soft.  Musculoskeletal:     Cervical back: Normal  range of motion and neck supple.     Right lower leg: No edema.     Left lower leg: No edema.  Skin:    General: Skin is warm and dry.     Comments: Well healed incision, scar noted.   Neurological:     Mental Status: She is alert.  Psychiatric:        Mood and Affect: Mood normal.     Labs reviewed: Basic Metabolic Panel: Recent Labs    01/19/22 0851 06/13/22 0409 06/17/22 0358 06/18/22 0416 11/04/22 0305 11/05/22 0417 11/06/22 1112  NA 141   < > 136   < > 136  138 141  K 4.4   < > 3.3*   < > 4.7 4.9 4.6  CL 105   < > 98   < > 102 104 104  CO2 30   < > 29   < > 27 26 28   GLUCOSE 137*   < > 135*   < > 133* 144* 182*  BUN 17   < > 14   < > 47* 50* 40*  CREATININE 0.98*   < > 0.91   < > 1.42* 1.31* 1.02*  CALCIUM 8.3*   < > 8.0*   < > 8.3* 8.2* 8.6*  MG  --    < > 1.5*   < > 2.3 2.2 2.1  PHOS  --    < > 3.2  --   --  3.8 3.8  TSH 2.68  --   --   --   --   --   --    < > = values in this interval not displayed.   Liver Function Tests: Recent Labs    06/20/22 0352 06/21/22 0352 07/31/22 1533 11/05/22 0417  AST 21 18 15 24   ALT 31 25 11 12   ALKPHOS 41 46  --  34*  BILITOT 0.6 0.7 0.6 0.6  PROT 6.7 7.1 7.1 5.7*  ALBUMIN 2.9* 3.1*  --  2.3*   Recent Labs    06/13/22 0800 06/15/22 1015  LIPASE 3,094* 47   No results for input(s): "AMMONIA" in the last 8760 hours. CBC: Recent Labs    10/31/22 1506 11/02/22 0403 11/03/22 0433 11/04/22 0305 11/05/22 0417 11/05/22 0854 11/05/22 1711  WBC 9.4 11.0* 11.5* 11.2* 9.7  --   --   NEUTROABS 7.7 7.4 9.1*  --   --   --   --   HGB 12.5 10.8* 9.3* 8.2* 7.4* 7.7* 8.4*  HCT 40.8 35.1* 30.2* 26.5* 24.2* 24.7* 27.0*  MCV 92.9 94.4 93.2 94.0 92.0  --   --   PLT 243 211 194 198 229  --   --    Lipid Panel: Recent Labs    01/19/22 0851  CHOL 125  HDL 63  LDLCALC 39  TRIG 145  CHOLHDL 2.0   TSH: Recent Labs    01/19/22 0851  TSH 2.68   A1C: Lab Results  Component Value Date   HGBA1C 6.4 (H) 07/31/2022     Assessment/Plan 1. Closed comminuted intertrochanteric fracture of left femur with routine healing, subsequent encounter -doing well post-op. Pain controlled, continues with ortho follow up and home health PT - BASIC METABOLIC PANEL WITH GFR - CBC with Differential/Platelet  2. Iron deficiency anemia secondary to inadequate dietary iron intake Continues on iron supplement will follow up labs at this time - BASIC METABOLIC PANEL WITH GFR - CBC with  Differential/Platelet  3. Hypothyroidism due to  acquired atrophy of thyroid Continues on synthroid, due for TSH - TSH  4. Type 2 diabetes mellitus with stage 3b chronic kidney disease, without long-term current use of insulin (HCC) Encouraged dietary compliance, routine foot care/monitoring and to keep up with diabetic eye exams through ophthalmology  Continues on metformin  - Hemoglobin A1c  5. Neuropathy Stable at this time  6. Acquired hypercoagulable state (HCC) Continues on eliquis BID  7. Pure hypercholesterolemia Continues on crestor.  - Lipid panel  8. RLS (restless legs syndrome) To reduce requip back to daily now that home and symptoms have improved.    Keep follow up as scheduled.   Janene Harvey. [facility]ag[facility] [phone]

## 2022-12-23 NOTE — Patient Instructions (Signed)
Decrease requip to once daily at bedtime.

## 2022-12-24 DIAGNOSIS — E0590 Thyrotoxicosis, unspecified without thyrotoxic crisis or storm: Secondary | ICD-10-CM | POA: Diagnosis not present

## 2022-12-24 DIAGNOSIS — I129 Hypertensive chronic kidney disease with stage 1 through stage 4 chronic kidney disease, or unspecified chronic kidney disease: Secondary | ICD-10-CM | POA: Diagnosis not present

## 2022-12-24 DIAGNOSIS — E1122 Type 2 diabetes mellitus with diabetic chronic kidney disease: Secondary | ICD-10-CM | POA: Diagnosis not present

## 2022-12-24 DIAGNOSIS — J4489 Other specified chronic obstructive pulmonary disease: Secondary | ICD-10-CM | POA: Diagnosis not present

## 2022-12-24 DIAGNOSIS — F32A Depression, unspecified: Secondary | ICD-10-CM | POA: Diagnosis not present

## 2022-12-24 DIAGNOSIS — G4733 Obstructive sleep apnea (adult) (pediatric): Secondary | ICD-10-CM | POA: Diagnosis not present

## 2022-12-24 DIAGNOSIS — I4719 Other supraventricular tachycardia: Secondary | ICD-10-CM | POA: Diagnosis not present

## 2022-12-24 DIAGNOSIS — N189 Chronic kidney disease, unspecified: Secondary | ICD-10-CM | POA: Diagnosis not present

## 2022-12-24 DIAGNOSIS — S72142D Displaced intertrochanteric fracture of left femur, subsequent encounter for closed fracture with routine healing: Secondary | ICD-10-CM | POA: Diagnosis not present

## 2022-12-24 LAB — BASIC METABOLIC PANEL WITH GFR
BUN/Creatinine Ratio: 19 (calc) (ref 6–22)
BUN: 23 mg/dL (ref 7–25)
CO2: 28 mmol/L (ref 20–32)
Calcium: 9.7 mg/dL (ref 8.6–10.4)
Chloride: 103 mmol/L (ref 98–110)
Creat: 1.19 mg/dL — ABNORMAL HIGH (ref 0.60–0.95)
Glucose, Bld: 99 mg/dL (ref 65–139)
Potassium: 4.9 mmol/L (ref 3.5–5.3)
Sodium: 141 mmol/L (ref 135–146)
eGFR: 45 mL/min/{1.73_m2} — ABNORMAL LOW (ref >=60)

## 2022-12-24 LAB — CBC WITH DIFFERENTIAL/PLATELET
Absolute Lymphocytes: 1980 {cells}/uL (ref 850–3900)
Absolute Monocytes: 900 {cells}/uL (ref 200–950)
Basophils Absolute: 80 {cells}/uL (ref 0–200)
Basophils Relative: 0.8 %
Eosinophils Absolute: 190 {cells}/uL (ref 15–500)
Eosinophils Relative: 1.9 %
HCT: 40.6 % (ref 35.0–45.0)
Hemoglobin: 12.8 g/dL (ref 11.7–15.5)
MCH: 29.6 pg (ref 27.0–33.0)
MCHC: 31.5 g/dL — ABNORMAL LOW (ref 32.0–36.0)
MCV: 94 fL (ref 80.0–100.0)
MPV: 10.6 fL (ref 7.5–12.5)
Monocytes Relative: 9 %
Neutro Abs: 6850 {cells}/uL (ref 1500–7800)
Neutrophils Relative %: 68.5 %
Platelets: 319 10*3/uL (ref 140–400)
RBC: 4.32 10*6/uL (ref 3.80–5.10)
RDW: 14.1 % (ref 11.0–15.0)
Total Lymphocyte: 19.8 %
WBC: 10 10*3/uL (ref 3.8–10.8)

## 2022-12-24 LAB — LIPID PANEL
Cholesterol: 112 mg/dL (ref <200)
HDL: 52 mg/dL (ref >=50)
LDL Cholesterol (Calc): 33 mg/dL
Non-HDL Cholesterol (Calc): 60 mg/dL (ref <130)
Total CHOL/HDL Ratio: 2.2 (calc) (ref <5.0)
Triglycerides: 204 mg/dL — ABNORMAL HIGH (ref <150)

## 2022-12-24 LAB — TSH: TSH: 2.91 m[IU]/L (ref 0.40–4.50)

## 2022-12-24 LAB — HEMOGLOBIN A1C
Hgb A1c MFr Bld: 5.2 %{Hb} (ref <5.7)
Mean Plasma Glucose: 103 mg/dL
eAG (mmol/L): 5.7 mmol/L

## 2022-12-29 DIAGNOSIS — I129 Hypertensive chronic kidney disease with stage 1 through stage 4 chronic kidney disease, or unspecified chronic kidney disease: Secondary | ICD-10-CM | POA: Diagnosis not present

## 2022-12-29 DIAGNOSIS — S72142D Displaced intertrochanteric fracture of left femur, subsequent encounter for closed fracture with routine healing: Secondary | ICD-10-CM | POA: Diagnosis not present

## 2022-12-29 DIAGNOSIS — F32A Depression, unspecified: Secondary | ICD-10-CM | POA: Diagnosis not present

## 2022-12-29 DIAGNOSIS — N189 Chronic kidney disease, unspecified: Secondary | ICD-10-CM | POA: Diagnosis not present

## 2022-12-29 DIAGNOSIS — I4719 Other supraventricular tachycardia: Secondary | ICD-10-CM | POA: Diagnosis not present

## 2022-12-29 DIAGNOSIS — E1122 Type 2 diabetes mellitus with diabetic chronic kidney disease: Secondary | ICD-10-CM | POA: Diagnosis not present

## 2022-12-29 DIAGNOSIS — G4733 Obstructive sleep apnea (adult) (pediatric): Secondary | ICD-10-CM | POA: Diagnosis not present

## 2022-12-29 DIAGNOSIS — J4489 Other specified chronic obstructive pulmonary disease: Secondary | ICD-10-CM | POA: Diagnosis not present

## 2022-12-29 DIAGNOSIS — E0590 Thyrotoxicosis, unspecified without thyrotoxic crisis or storm: Secondary | ICD-10-CM | POA: Diagnosis not present

## 2022-12-30 DIAGNOSIS — F32A Depression, unspecified: Secondary | ICD-10-CM | POA: Diagnosis not present

## 2022-12-30 DIAGNOSIS — E1122 Type 2 diabetes mellitus with diabetic chronic kidney disease: Secondary | ICD-10-CM | POA: Diagnosis not present

## 2022-12-30 DIAGNOSIS — J4489 Other specified chronic obstructive pulmonary disease: Secondary | ICD-10-CM | POA: Diagnosis not present

## 2022-12-30 DIAGNOSIS — S72142D Displaced intertrochanteric fracture of left femur, subsequent encounter for closed fracture with routine healing: Secondary | ICD-10-CM | POA: Diagnosis not present

## 2022-12-30 DIAGNOSIS — G4733 Obstructive sleep apnea (adult) (pediatric): Secondary | ICD-10-CM | POA: Diagnosis not present

## 2022-12-30 DIAGNOSIS — I129 Hypertensive chronic kidney disease with stage 1 through stage 4 chronic kidney disease, or unspecified chronic kidney disease: Secondary | ICD-10-CM | POA: Diagnosis not present

## 2022-12-30 DIAGNOSIS — N189 Chronic kidney disease, unspecified: Secondary | ICD-10-CM | POA: Diagnosis not present

## 2022-12-30 DIAGNOSIS — I4719 Other supraventricular tachycardia: Secondary | ICD-10-CM | POA: Diagnosis not present

## 2022-12-30 DIAGNOSIS — E0590 Thyrotoxicosis, unspecified without thyrotoxic crisis or storm: Secondary | ICD-10-CM | POA: Diagnosis not present

## 2022-12-31 DIAGNOSIS — G4733 Obstructive sleep apnea (adult) (pediatric): Secondary | ICD-10-CM | POA: Diagnosis not present

## 2022-12-31 DIAGNOSIS — J4489 Other specified chronic obstructive pulmonary disease: Secondary | ICD-10-CM | POA: Diagnosis not present

## 2022-12-31 DIAGNOSIS — E1122 Type 2 diabetes mellitus with diabetic chronic kidney disease: Secondary | ICD-10-CM | POA: Diagnosis not present

## 2022-12-31 DIAGNOSIS — I129 Hypertensive chronic kidney disease with stage 1 through stage 4 chronic kidney disease, or unspecified chronic kidney disease: Secondary | ICD-10-CM | POA: Diagnosis not present

## 2022-12-31 DIAGNOSIS — N189 Chronic kidney disease, unspecified: Secondary | ICD-10-CM | POA: Diagnosis not present

## 2022-12-31 DIAGNOSIS — S72142D Displaced intertrochanteric fracture of left femur, subsequent encounter for closed fracture with routine healing: Secondary | ICD-10-CM | POA: Diagnosis not present

## 2022-12-31 DIAGNOSIS — I4719 Other supraventricular tachycardia: Secondary | ICD-10-CM | POA: Diagnosis not present

## 2022-12-31 DIAGNOSIS — E0590 Thyrotoxicosis, unspecified without thyrotoxic crisis or storm: Secondary | ICD-10-CM | POA: Diagnosis not present

## 2022-12-31 DIAGNOSIS — F32A Depression, unspecified: Secondary | ICD-10-CM | POA: Diagnosis not present

## 2023-01-04 ENCOUNTER — Ambulatory Visit: Payer: Medicare PPO

## 2023-01-04 DIAGNOSIS — M810 Age-related osteoporosis without current pathological fracture: Secondary | ICD-10-CM

## 2023-01-05 DIAGNOSIS — G4733 Obstructive sleep apnea (adult) (pediatric): Secondary | ICD-10-CM | POA: Diagnosis not present

## 2023-01-05 DIAGNOSIS — E1122 Type 2 diabetes mellitus with diabetic chronic kidney disease: Secondary | ICD-10-CM | POA: Diagnosis not present

## 2023-01-05 DIAGNOSIS — E0590 Thyrotoxicosis, unspecified without thyrotoxic crisis or storm: Secondary | ICD-10-CM | POA: Diagnosis not present

## 2023-01-05 DIAGNOSIS — S72142D Displaced intertrochanteric fracture of left femur, subsequent encounter for closed fracture with routine healing: Secondary | ICD-10-CM | POA: Diagnosis not present

## 2023-01-05 DIAGNOSIS — F32A Depression, unspecified: Secondary | ICD-10-CM | POA: Diagnosis not present

## 2023-01-05 DIAGNOSIS — I4719 Other supraventricular tachycardia: Secondary | ICD-10-CM | POA: Diagnosis not present

## 2023-01-05 DIAGNOSIS — J4489 Other specified chronic obstructive pulmonary disease: Secondary | ICD-10-CM | POA: Diagnosis not present

## 2023-01-05 DIAGNOSIS — I129 Hypertensive chronic kidney disease with stage 1 through stage 4 chronic kidney disease, or unspecified chronic kidney disease: Secondary | ICD-10-CM | POA: Diagnosis not present

## 2023-01-05 DIAGNOSIS — M6281 Muscle weakness (generalized): Secondary | ICD-10-CM | POA: Diagnosis not present

## 2023-01-05 DIAGNOSIS — N189 Chronic kidney disease, unspecified: Secondary | ICD-10-CM | POA: Diagnosis not present

## 2023-01-11 DIAGNOSIS — E1122 Type 2 diabetes mellitus with diabetic chronic kidney disease: Secondary | ICD-10-CM | POA: Diagnosis not present

## 2023-01-11 DIAGNOSIS — S72142D Displaced intertrochanteric fracture of left femur, subsequent encounter for closed fracture with routine healing: Secondary | ICD-10-CM | POA: Diagnosis not present

## 2023-01-11 DIAGNOSIS — N189 Chronic kidney disease, unspecified: Secondary | ICD-10-CM | POA: Diagnosis not present

## 2023-01-11 DIAGNOSIS — E0590 Thyrotoxicosis, unspecified without thyrotoxic crisis or storm: Secondary | ICD-10-CM | POA: Diagnosis not present

## 2023-01-11 DIAGNOSIS — F32A Depression, unspecified: Secondary | ICD-10-CM | POA: Diagnosis not present

## 2023-01-11 DIAGNOSIS — G4733 Obstructive sleep apnea (adult) (pediatric): Secondary | ICD-10-CM | POA: Diagnosis not present

## 2023-01-11 DIAGNOSIS — I4719 Other supraventricular tachycardia: Secondary | ICD-10-CM | POA: Diagnosis not present

## 2023-01-11 DIAGNOSIS — J4489 Other specified chronic obstructive pulmonary disease: Secondary | ICD-10-CM | POA: Diagnosis not present

## 2023-01-11 DIAGNOSIS — I129 Hypertensive chronic kidney disease with stage 1 through stage 4 chronic kidney disease, or unspecified chronic kidney disease: Secondary | ICD-10-CM | POA: Diagnosis not present

## 2023-01-14 ENCOUNTER — Other Ambulatory Visit: Payer: Self-pay | Admitting: Nurse Practitioner

## 2023-01-14 ENCOUNTER — Other Ambulatory Visit: Payer: Self-pay | Admitting: Physician Assistant

## 2023-01-14 DIAGNOSIS — D508 Other iron deficiency anemias: Secondary | ICD-10-CM

## 2023-01-14 DIAGNOSIS — F32A Depression, unspecified: Secondary | ICD-10-CM | POA: Diagnosis not present

## 2023-01-14 DIAGNOSIS — I129 Hypertensive chronic kidney disease with stage 1 through stage 4 chronic kidney disease, or unspecified chronic kidney disease: Secondary | ICD-10-CM | POA: Diagnosis not present

## 2023-01-14 DIAGNOSIS — S72142D Displaced intertrochanteric fracture of left femur, subsequent encounter for closed fracture with routine healing: Secondary | ICD-10-CM | POA: Diagnosis not present

## 2023-01-14 DIAGNOSIS — I4719 Other supraventricular tachycardia: Secondary | ICD-10-CM | POA: Diagnosis not present

## 2023-01-14 DIAGNOSIS — J4489 Other specified chronic obstructive pulmonary disease: Secondary | ICD-10-CM | POA: Diagnosis not present

## 2023-01-14 DIAGNOSIS — M810 Age-related osteoporosis without current pathological fracture: Secondary | ICD-10-CM

## 2023-01-14 DIAGNOSIS — K219 Gastro-esophageal reflux disease without esophagitis: Secondary | ICD-10-CM

## 2023-01-14 DIAGNOSIS — G4733 Obstructive sleep apnea (adult) (pediatric): Secondary | ICD-10-CM | POA: Diagnosis not present

## 2023-01-14 DIAGNOSIS — E1122 Type 2 diabetes mellitus with diabetic chronic kidney disease: Secondary | ICD-10-CM | POA: Diagnosis not present

## 2023-01-14 DIAGNOSIS — N189 Chronic kidney disease, unspecified: Secondary | ICD-10-CM | POA: Diagnosis not present

## 2023-01-14 DIAGNOSIS — E0590 Thyrotoxicosis, unspecified without thyrotoxic crisis or storm: Secondary | ICD-10-CM | POA: Diagnosis not present

## 2023-01-14 MED ORDER — DENOSUMAB 60 MG/ML ~~LOC~~ SOSY: 60.0000 mg | PREFILLED_SYRINGE | Freq: Once | SUBCUTANEOUS | Status: AC

## 2023-01-19 ENCOUNTER — Telehealth: Payer: Self-pay | Admitting: Orthopaedic Surgery

## 2023-01-19 ENCOUNTER — Other Ambulatory Visit: Payer: Self-pay | Admitting: Physician Assistant

## 2023-01-19 DIAGNOSIS — J4489 Other specified chronic obstructive pulmonary disease: Secondary | ICD-10-CM | POA: Diagnosis not present

## 2023-01-19 DIAGNOSIS — N189 Chronic kidney disease, unspecified: Secondary | ICD-10-CM | POA: Diagnosis not present

## 2023-01-19 DIAGNOSIS — I4719 Other supraventricular tachycardia: Secondary | ICD-10-CM | POA: Diagnosis not present

## 2023-01-19 DIAGNOSIS — E1122 Type 2 diabetes mellitus with diabetic chronic kidney disease: Secondary | ICD-10-CM | POA: Diagnosis not present

## 2023-01-19 DIAGNOSIS — E0590 Thyrotoxicosis, unspecified without thyrotoxic crisis or storm: Secondary | ICD-10-CM | POA: Diagnosis not present

## 2023-01-19 DIAGNOSIS — G4733 Obstructive sleep apnea (adult) (pediatric): Secondary | ICD-10-CM | POA: Diagnosis not present

## 2023-01-19 DIAGNOSIS — I129 Hypertensive chronic kidney disease with stage 1 through stage 4 chronic kidney disease, or unspecified chronic kidney disease: Secondary | ICD-10-CM | POA: Diagnosis not present

## 2023-01-19 DIAGNOSIS — S72142D Displaced intertrochanteric fracture of left femur, subsequent encounter for closed fracture with routine healing: Secondary | ICD-10-CM | POA: Diagnosis not present

## 2023-01-19 DIAGNOSIS — F32A Depression, unspecified: Secondary | ICD-10-CM | POA: Diagnosis not present

## 2023-01-19 MED ORDER — TRAMADOL HCL 50 MG PO TABS: 50.0000 mg | ORAL_TABLET | Freq: Two times a day (BID) | ORAL | 1 refills | Status: DC | PRN

## 2023-01-19 NOTE — Telephone Encounter (Signed)
Patient called advised the pharmacy in [facility] sent over a request for a refill on Tramadol twice. Patient asked for a call when Rx has been sent to the pharmacy. The number to contact patient is [phone]

## 2023-01-19 NOTE — Telephone Encounter (Signed)
sent 

## 2023-01-20 DIAGNOSIS — G4733 Obstructive sleep apnea (adult) (pediatric): Secondary | ICD-10-CM | POA: Diagnosis not present

## 2023-01-20 DIAGNOSIS — E0590 Thyrotoxicosis, unspecified without thyrotoxic crisis or storm: Secondary | ICD-10-CM | POA: Diagnosis not present

## 2023-01-20 DIAGNOSIS — S72142D Displaced intertrochanteric fracture of left femur, subsequent encounter for closed fracture with routine healing: Secondary | ICD-10-CM | POA: Diagnosis not present

## 2023-01-20 DIAGNOSIS — I129 Hypertensive chronic kidney disease with stage 1 through stage 4 chronic kidney disease, or unspecified chronic kidney disease: Secondary | ICD-10-CM | POA: Diagnosis not present

## 2023-01-20 DIAGNOSIS — I4719 Other supraventricular tachycardia: Secondary | ICD-10-CM | POA: Diagnosis not present

## 2023-01-20 DIAGNOSIS — J4489 Other specified chronic obstructive pulmonary disease: Secondary | ICD-10-CM | POA: Diagnosis not present

## 2023-01-20 DIAGNOSIS — N189 Chronic kidney disease, unspecified: Secondary | ICD-10-CM | POA: Diagnosis not present

## 2023-01-20 DIAGNOSIS — E1122 Type 2 diabetes mellitus with diabetic chronic kidney disease: Secondary | ICD-10-CM | POA: Diagnosis not present

## 2023-01-20 DIAGNOSIS — F32A Depression, unspecified: Secondary | ICD-10-CM | POA: Diagnosis not present

## 2023-01-25 ENCOUNTER — Ambulatory Visit: Payer: Medicare PPO | Admitting: Nurse Practitioner

## 2023-01-29 ENCOUNTER — Ambulatory Visit: Payer: Medicare PPO | Admitting: Nurse Practitioner

## 2023-01-29 DIAGNOSIS — G4733 Obstructive sleep apnea (adult) (pediatric): Secondary | ICD-10-CM | POA: Diagnosis not present

## 2023-01-29 DIAGNOSIS — S72142D Displaced intertrochanteric fracture of left femur, subsequent encounter for closed fracture with routine healing: Secondary | ICD-10-CM | POA: Diagnosis not present

## 2023-01-29 DIAGNOSIS — N189 Chronic kidney disease, unspecified: Secondary | ICD-10-CM | POA: Diagnosis not present

## 2023-01-29 DIAGNOSIS — I129 Hypertensive chronic kidney disease with stage 1 through stage 4 chronic kidney disease, or unspecified chronic kidney disease: Secondary | ICD-10-CM | POA: Diagnosis not present

## 2023-01-29 DIAGNOSIS — E1122 Type 2 diabetes mellitus with diabetic chronic kidney disease: Secondary | ICD-10-CM | POA: Diagnosis not present

## 2023-01-29 DIAGNOSIS — J4489 Other specified chronic obstructive pulmonary disease: Secondary | ICD-10-CM | POA: Diagnosis not present

## 2023-01-29 DIAGNOSIS — F32A Depression, unspecified: Secondary | ICD-10-CM | POA: Diagnosis not present

## 2023-01-29 DIAGNOSIS — I4719 Other supraventricular tachycardia: Secondary | ICD-10-CM | POA: Diagnosis not present

## 2023-01-29 DIAGNOSIS — E0590 Thyrotoxicosis, unspecified without thyrotoxic crisis or storm: Secondary | ICD-10-CM | POA: Diagnosis not present

## 2023-01-31 DIAGNOSIS — E1122 Type 2 diabetes mellitus with diabetic chronic kidney disease: Secondary | ICD-10-CM | POA: Diagnosis not present

## 2023-01-31 DIAGNOSIS — N189 Chronic kidney disease, unspecified: Secondary | ICD-10-CM | POA: Diagnosis not present

## 2023-01-31 DIAGNOSIS — E0590 Thyrotoxicosis, unspecified without thyrotoxic crisis or storm: Secondary | ICD-10-CM | POA: Diagnosis not present

## 2023-01-31 DIAGNOSIS — G4733 Obstructive sleep apnea (adult) (pediatric): Secondary | ICD-10-CM | POA: Diagnosis not present

## 2023-01-31 DIAGNOSIS — I129 Hypertensive chronic kidney disease with stage 1 through stage 4 chronic kidney disease, or unspecified chronic kidney disease: Secondary | ICD-10-CM | POA: Diagnosis not present

## 2023-01-31 DIAGNOSIS — F32A Depression, unspecified: Secondary | ICD-10-CM | POA: Diagnosis not present

## 2023-01-31 DIAGNOSIS — J4489 Other specified chronic obstructive pulmonary disease: Secondary | ICD-10-CM | POA: Diagnosis not present

## 2023-01-31 DIAGNOSIS — I4719 Other supraventricular tachycardia: Secondary | ICD-10-CM | POA: Diagnosis not present

## 2023-01-31 DIAGNOSIS — S72142D Displaced intertrochanteric fracture of left femur, subsequent encounter for closed fracture with routine healing: Secondary | ICD-10-CM | POA: Diagnosis not present

## 2023-02-15 ENCOUNTER — Ambulatory Visit: Payer: Medicare PPO | Admitting: Nurse Practitioner

## 2023-02-15 ENCOUNTER — Encounter: Payer: Self-pay | Admitting: Nurse Practitioner

## 2023-02-15 VITALS — BP 118/72 | HR 63 | Temp 97.5°F | Resp 18 | Ht 65.0 in | Wt 214.4 lb

## 2023-02-15 DIAGNOSIS — G629 Polyneuropathy, unspecified: Secondary | ICD-10-CM

## 2023-02-15 DIAGNOSIS — E034 Atrophy of thyroid (acquired): Secondary | ICD-10-CM | POA: Diagnosis not present

## 2023-02-15 DIAGNOSIS — E1122 Type 2 diabetes mellitus with diabetic chronic kidney disease: Secondary | ICD-10-CM

## 2023-02-15 DIAGNOSIS — G2581 Restless legs syndrome: Secondary | ICD-10-CM

## 2023-02-15 DIAGNOSIS — N1832 Chronic kidney disease, stage 3b: Secondary | ICD-10-CM | POA: Diagnosis not present

## 2023-02-15 DIAGNOSIS — D508 Other iron deficiency anemias: Secondary | ICD-10-CM

## 2023-02-15 DIAGNOSIS — M810 Age-related osteoporosis without current pathological fracture: Secondary | ICD-10-CM

## 2023-02-15 MED ORDER — METFORMIN HCL 500 MG PO TABS: 250.0000 mg | ORAL_TABLET | Freq: Every day | ORAL | Status: DC

## 2023-02-15 MED ORDER — ROPINIROLE HCL 1 MG PO TABS: 1.0000 mg | ORAL_TABLET | Freq: Two times a day (BID) | ORAL | 1 refills | Status: DC

## 2023-02-15 NOTE — Progress Notes (Signed)
 Careteam: Patient Care Team: [name], NP as PCP - General (Geriatric Medicine) Jordan, [name], MD as PCP - Cardiology (Cardiology) Ernie Cough, MD as Consulting Physician (Orthopedic Surgery) Robinson Pao, MD as Consulting Physician (Dermatology) Fleurette Brow, MD as Referring Physician (Nephrology) Shellia Oh, MD (Inactive) as Consulting Physician (Pulmonary Disease) Abigail Maude POUR (Optometry)  PLACE OF SERVICE:  [facility]  Advanced Directive information Does Patient Have a Medical Advance Directive?: Yes, Type of Advance Directive: Healthcare Power of [place];Living will, Does patient want to make changes to medical advance directive?: No - Patient declined  No Known Allergies  Chief Complaint  Patient presents with   Medical Management of Chronic Issues    6 month follow up and discuss foot exam,tdap,and covid vaccines.     HPI: Patient is a 83 y.o. female for routine follow up.   Reports she has ongoing restless legs, she had to go back up to twice daily on her requip  to 1 mg twice daily.  Her PT said that the RLS.   Continues to go to PT- she was off for 2 weeks but has 2 months to go.   Energy level has improved  Anemia improved on recent labs.   A1c at 5.2- on metformin  500 mg daily. No low blood sugars.  She likes sweets and does not do dietary modifications.   Review of Systems:  Review of Systems  Constitutional:  Negative for chills, fever and weight loss.  HENT:  Negative for tinnitus.   Respiratory:  Negative for cough, sputum production and shortness of breath.   Cardiovascular:  Negative for chest pain, palpitations and leg swelling.  Gastrointestinal:  Negative for abdominal pain, constipation, diarrhea and heartburn.  Genitourinary:  Negative for dysuria, frequency and urgency.  Musculoskeletal:  Positive for joint pain. Negative for back pain, falls and myalgias.  Skin: Negative.   Neurological:  Negative for dizziness and  headaches.  Psychiatric/Behavioral:  Negative for depression and memory loss. The patient does not have insomnia.     Past Medical History:  Diagnosis Date   Actinic keratosis    Arthritis    Asthma    Back injury 2019   Fracture   Basal cell carcinoma    COPD (chronic obstructive pulmonary disease) (HCC)    GERD (gastroesophageal reflux disease)    H/O blood clots 2020   Lung, Per [facility] New Patient Packet   H/O mammogram 2022   Per [facility] New Patient Packet   Heart murmur    hx of in childhood    High cholesterol    Hyperlipidemia    Hypertension    Hypothyroidism    Kidney disease    Per [facility] New Patient Packet   MCNS (minimal change nephrotic syndrome)    OSA (obstructive sleep apnea) 08/29/2019   Osteopenia    Shortness of breath dyspnea    on exertion    Squamous cell carcinoma    Thyroid  disease    Per [facility] New Patient Packet   Past Surgical History:  Procedure Laterality Date   ABDOMINAL HYSTERECTOMY     APPENDECTOMY     BILATERAL SALPINGOOPHORECTOMY     Ovarian Cysts    CHOLECYSTECTOMY N/A 06/17/2022   Procedure: LAPAROSCOPIC CHOLECYSTECTOMY;  Surgeon: Dasie Leonor CROME, MD;  Location: [facility];  Service: General;  Laterality: N/A;   COLONOSCOPY  2001   Dr.Mann, Per [facility] New Patient Packet   CONVERSION TO TOTAL KNEE Right 07/10/2014   Procedure: RIGHT CONVERSION OF  PARTIAL KNEE TO TOTAL KNEE;  Surgeon: Donnice Car, MD;  Location: [facility];  Service: Orthopedics;  Laterality: Right;   ENDOSCOPIC RETROGRADE CHOLANGIOPANCREATOGRAPHY (ERCP) WITH PROPOFOL  N/A 06/15/2022   Procedure: ENDOSCOPIC RETROGRADE CHOLANGIOPANCREATOGRAPHY (ERCP) WITH PROPOFOL ;  Surgeon: Rosalie Kitchens, MD;  Location: [facility];  Service: Gastroenterology;  Laterality: N/A;   INTRAMEDULLARY (IM) NAIL INTERTROCHANTERIC Left 11/02/2022   Procedure: INTRAMEDULLARY NAILING OF LEFT FEMUR;  Surgeon: Jerri Kay HERO, MD;  Location: [facility];  Service: Orthopedics;  Laterality: Left;   IR ANGIOGRAM PULMONARY BILATERAL  SELECTIVE  02/08/2019   IR ANGIOGRAM SELECTIVE EACH ADDITIONAL VESSEL  02/08/2019   IR ANGIOGRAM SELECTIVE EACH ADDITIONAL VESSEL  02/08/2019   IR INFUSION THROMBOL ARTERIAL INITIAL (MS)  02/08/2019   IR INFUSION THROMBOL ARTERIAL INITIAL (MS)  02/08/2019   IR THROMB F/U EVAL ART/VEN FINAL DAY (MS)  02/09/2019   IR US  GUIDE VASC ACCESS RIGHT  02/08/2019   MEDIAL PARTIAL KNEE REPLACEMENT Bilateral    MOHS SURGERY     x 2   REMOVAL OF STONES  06/15/2022   Procedure: REMOVAL OF STONES;  Surgeon: Rosalie Kitchens, MD;  Location: [facility];  Service: Gastroenterology;;   RENAL BIOPSY     x 2   REPLACEMENT TOTAL KNEE  2012   Per [facility] New Patient Packet   ROTATOR CUFF REPAIR Left 2008   SPHINCTEROTOMY  06/15/2022   Procedure: SPHINCTEROTOMY;  Surgeon: Rosalie Kitchens, MD;  Location: [facility];  Service: Gastroenterology;;   Social History:   reports that she quit smoking about 50 years ago. Her smoking use included cigarettes. She started smoking about 75 years ago. She has a 37.5 pack-year smoking history. She has never used smokeless tobacco. She reports current alcohol  use of about 0.5 standard drinks of alcohol  per week. She reports that she does not use drugs.  Family History  Problem Relation Age of Onset   COPD Mother        Husband Smoked    Lymphoma Father    Hypertension Sister    Scoliosis Daughter    Stroke Maternal Grandfather    Rheumatic fever Paternal Grandmother    Heart disease Paternal Grandfather     Medications: Patient's Medications  New Prescriptions   No medications on file  Previous Medications   ALBUTEROL  (VENTOLIN  HFA) 108 (90 BASE) MCG/ACT INHALER    Inhale 1-2 puffs into the lungs every 6 (six) hours as needed for wheezing or shortness of breath.   CHOLECALCIFEROL (VITAMIN D3 PO)    Take 1 capsule by mouth daily.   CITALOPRAM  (CELEXA ) 20 MG TABLET    Take 1 tablet (20 mg total) by mouth at bedtime. DX F41.9   DOCUSATE SODIUM  (COLACE) 100 MG CAPSULE    Take 1  capsule (100 mg total) by mouth 2 (two) times daily.   DONEPEZIL  (ARICEPT ) 10 MG TABLET    TAKE 1 TABLET BY MOUTH EVERYDAY AT BEDTIME   ELIQUIS  5 MG TABS TABLET    TAKE 1 TABLET BY MOUTH TWICE A DAY   FERROUS SULFATE  325 (65 FE) MG EC TABLET    TAKE 1 TABLET BY MOUTH EVERY DAY WITH BREAKFAST   FLUTICASONE  (FLONASE ) 50 MCG/ACT NASAL SPRAY    SPRAY 1 SPRAY INTO EACH NOSTRIL DAILY   GUAIFENESIN  (MUCINEX ) 600 MG 12 HR TABLET    Take 1,200 mg by mouth as needed for to loosen phlegm.   IPRATROPIUM (ATROVENT ) 0.03 % NASAL SPRAY    SPRAY 2 SPRAYS INTO EACH NOSTRIL 3  TIMES A DAY AS NEEDED FOR RHINITIS   LEVOTHYROXINE  (SYNTHROID ) 88 MCG TABLET    TAKE 1 TABLET BY MOUTH EVERY DAY IN THE MORNING   LOSARTAN  (COZAAR ) 100 MG TABLET    TAKE 1 TABLET BY MOUTH EVERY DAY   MELATONIN 10 MG TABS    Take 10 mg by mouth at bedtime.   METFORMIN  (GLUCOPHAGE ) 500 MG TABLET    Take 1 tablet (500 mg total) by mouth daily with supper.   METOPROLOL  TARTRATE (LOPRESSOR ) 25 MG TABLET    Take 1 tablet (25 mg total) by mouth 2 (two) times daily.   MONTELUKAST  (SINGULAIR ) 10 MG TABLET    TAKE 1 TABLET BY MOUTH EVERYDAY AT BEDTIME   MULTIPLE VITAMIN (MULTIVITAMIN WITH MINERALS) TABS TABLET    Take 1 tablet by mouth daily with breakfast.   PANTOPRAZOLE  (PROTONIX ) 40 MG TABLET    Take 1 tablet (40 mg total) by mouth at bedtime.   ROPINIROLE  (REQUIP ) 1 MG TABLET    TAKE 1 TABLET BY MOUTH EVERYDAY AT BEDTIME   ROSUVASTATIN  (CRESTOR ) 20 MG TABLET    Take 1 tablet (20 mg total) by mouth daily.   TACROLIMUS  (PROGRAF ) 1 MG CAPSULE    Take 1 mg by mouth in the morning and at bedtime.   TRAMADOL  (ULTRAM ) 50 MG TABLET    Take 1 tablet (50 mg total) by mouth every 12 (twelve) hours as needed.   TRELEGY ELLIPTA  100-62.5-25 MCG/ACT AEPB    INHALE 1 PUFF BY MOUTH EVERY DAY   TYLENOL  325 MG CAPS    Take 325-650 mg by mouth every 6 (six) hours as needed (for pain or headaches).  Modified Medications   No medications on file  Discontinued  Medications   No medications on file    Physical Exam:  Vitals:   02/15/23 1301  BP: 118/72  Pulse: 63  Resp: 18  Temp: (!) 97.5 F (36.4 C)  SpO2: 93%  Weight: 214 lb 6.4 oz (97.3 kg)  Height: 5' 5 (1.651 m)   Body mass index is 35.68 kg/m. Wt Readings from Last 3 Encounters:  02/15/23 214 lb 6.4 oz (97.3 kg)  12/23/22 211 lb 9.6 oz (96 kg)  11/06/22 224 lb 6.9 oz (101.8 kg)    Physical Exam Constitutional:      General: She is not in acute distress.    Appearance: She is well-developed. She is not diaphoretic.  HENT:     Head: Normocephalic and atraumatic.     Mouth/Throat:     Pharynx: No oropharyngeal exudate.  Eyes:     Conjunctiva/sclera: Conjunctivae normal.     Pupils: Pupils are equal, round, and reactive to light.  Cardiovascular:     Rate and Rhythm: Normal rate and regular rhythm.     Heart sounds: Normal heart sounds.  Pulmonary:     Effort: Pulmonary effort is normal.     Breath sounds: Normal breath sounds.  Abdominal:     General: Bowel sounds are normal.     Palpations: Abdomen is soft.  Musculoskeletal:     Cervical back: Normal range of motion and neck supple.     Right lower leg: No edema.     Left lower leg: No edema.  Skin:    General: Skin is warm and dry.  Neurological:     Mental Status: She is alert.  Psychiatric:        Mood and Affect: Mood normal.     Labs reviewed: Basic Metabolic Panel:  Recent Labs    06/17/22 0358 06/18/22 0416 11/04/22 0305 11/05/22 0417 11/06/22 1112 12/23/22 1501  NA 136   < > 136 138 141 141  K 3.3*   < > 4.7 4.9 4.6 4.9  CL 98   < > 102 104 104 103  CO2 29   < > 27 26 28 28   GLUCOSE 135*   < > 133* 144* 182* 99  BUN 14   < > 47* 50* 40* 23  CREATININE 0.91   < > 1.42* 1.31* 1.02* 1.19*  CALCIUM  8.0*   < > 8.3* 8.2* 8.6* 9.7  MG 1.5*   < > 2.3 2.2 2.1  --   PHOS 3.2  --   --  3.8 3.8  --   TSH  --   --   --   --   --  2.91   < > = values in this interval not displayed.   Liver  Function Tests: Recent Labs    06/20/22 0352 06/21/22 0352 07/31/22 1533 11/05/22 0417  AST 21 18 15 24   ALT 31 25 11 12   ALKPHOS 41 46  --  34*  BILITOT 0.6 0.7 0.6 0.6  PROT 6.7 7.1 7.1 5.7*  ALBUMIN 2.9* 3.1*  --  2.3*   Recent Labs    06/13/22 0800 06/15/22 1015  LIPASE 3,094* 47   No results for input(s): AMMONIA in the last 8760 hours. CBC: Recent Labs    11/02/22 0403 11/03/22 0433 11/04/22 0305 11/05/22 0417 11/05/22 0854 11/05/22 1711 12/23/22 1501  WBC 11.0* 11.5* 11.2* 9.7  --   --  10.0  NEUTROABS 7.4 9.1*  --   --   --   --  6,850  HGB 10.8* 9.3* 8.2* 7.4* 7.7* 8.4* 12.8  HCT 35.1* 30.2* 26.5* 24.2* 24.7* 27.0* 40.6  MCV 94.4 93.2 94.0 92.0  --   --  94.0  PLT 211 194 198 229  --   --  319   Lipid Panel: Recent Labs    12/23/22 1501  CHOL 112  HDL 52  LDLCALC 33  TRIG 204*  CHOLHDL 2.2   TSH: Recent Labs    12/23/22 1501  TSH 2.91   A1C: Lab Results  Component Value Date   HGBA1C 5.2 12/23/2022     Assessment/Plan 1. Osteoporosis, post-menopausal (Primary) -continues on prolia  with cal and vit d supplement Also recommend weight bearing exercise 5 days a week  2. Iron deficiency anemia secondary to inadequate dietary iron intake Continues on iron supplement, hgb has improved on recent labs  3. Hypothyroidism due to acquired atrophy of thyroid  TSH at goal on synthroid  88 mcg  4. Type 2 diabetes mellitus with stage 3b chronic kidney disease, without long-term current use of insulin  (HCC) -A1c well controlled, will reduce metformin  to 250 mg daily to avoid hypoglycemia and recommend dietary modifications - metFORMIN  (GLUCOPHAGE ) 500 MG tablet; Take 0.5 tablets (250 mg total) by mouth daily with breakfast.  5. Neuropathy Stable at this time.   6. RLS (restless legs syndrome) Could not tolerate dose reduction to daily, will increase to BID at this time - rOPINIRole  (REQUIP ) 1 MG tablet; Take 1 tablet (1 mg total) by mouth 2  (two) times daily.  Dispense: 180 tablet; Refill: 1   Return in about 6 months (around 08/15/2023) for routine follow up, labs with appt .  [name] [phone]

## 2023-02-15 NOTE — Patient Instructions (Signed)
 Decrease metformin to half tablet 250 mg daily for diabetes

## 2023-02-23 ENCOUNTER — Ambulatory Visit: Payer: Medicare PPO | Admitting: Physician Assistant

## 2023-03-02 ENCOUNTER — Ambulatory Visit: Payer: Medicare PPO | Admitting: Physician Assistant

## 2023-03-08 ENCOUNTER — Telehealth: Payer: Medicare PPO | Admitting: Orthopedic Surgery

## 2023-03-08 ENCOUNTER — Ambulatory Visit: Payer: Medicare PPO | Admitting: Nurse Practitioner

## 2023-03-08 ENCOUNTER — Encounter: Payer: Self-pay | Admitting: Orthopedic Surgery

## 2023-03-08 ENCOUNTER — Other Ambulatory Visit: Payer: Self-pay | Admitting: Physician Assistant

## 2023-03-08 DIAGNOSIS — R519 Headache, unspecified: Secondary | ICD-10-CM

## 2023-03-08 DIAGNOSIS — R053 Chronic cough: Secondary | ICD-10-CM | POA: Diagnosis not present

## 2023-03-08 MED ORDER — PREDNISONE 20 MG PO TABS: 40.0000 mg | ORAL_TABLET | Freq: Every day | ORAL | 0 refills | Status: AC

## 2023-03-08 MED ORDER — BENZONATATE 200 MG PO CAPS: 200.0000 mg | ORAL_CAPSULE | Freq: Three times a day (TID) | ORAL | 0 refills | Status: DC | PRN

## 2023-03-08 NOTE — Progress Notes (Signed)
Careteam: Patient Care Team: [name], NP as PCP - General (Geriatric Medicine) [name], [name], MD as PCP - Cardiology (Cardiology) [name], MD as Consulting Physician (Orthopedic Surgery) [name], MD as Consulting Physician (Dermatology) [name], MD as Referring Physician (Nephrology) [name], MD (Inactive) as Consulting Physician (Pulmonary Disease) [name] (Optometry)  Seen by: [name], AGNP-C  PLACE OF SERVICE:  [facility]  Advanced Directive information Does Patient Have a Medical Advance Directive?: Yes, Type of Advance Directive: Healthcare Power of [place];Living will, Does patient want to make changes to medical advance directive?: No - Patient declined  No Known Allergies  Chief Complaint  Patient presents with   Acute Visit    Patient complains of cough, headache, ear ache. Patient had negative Covid test. Patient will take another test. Symptoms started Saturday 03/07/2023.      HPI: Patient is a 83 y.o. female seen today via video visit due to ongoing cough, headache and ear pain.   Discussed the use of AI scribe software for clinical note transcription with the patient, who gave verbal consent to proceed.  History of Present Illness   The patient, with a history of diabetes and COPD, began experiencing symptoms of a respiratory illness began Saturday. Symptoms include: cough, headache, and right-sided ear pain. The cough is described as loose but non-productive, and it has been disruptive to her sleep. The headache, which started this morning, is localized to the area around their eyes. The ear pain is described as sharp, but there is no sensation of fullness. She denies fever, body aches, and nasal congestion, although they note a chronic runny nose.  The patient's daughter had a similar illness recently, and despite efforts to avoid contact, the patient believes they may have contracted the illness from her. They took a  home COVID-19 test on Sunday, which returned a negative result. They have been managing their symptoms with Tylenol, which has provided relief for the headache and earache, and Mucinex, taken twice daily.  They have also been taking zinc as a supplement. Despite their symptoms, they report that their breathing has not been affected.       Review of Systems:  Review of Systems  Constitutional:  Negative for malaise/fatigue.  HENT:  Positive for ear pain. Negative for ear discharge and tinnitus.   Respiratory:  Positive for cough. Negative for sputum production and shortness of breath.   Cardiovascular:  Negative for chest pain.  Gastrointestinal:  Negative for nausea and vomiting.  Musculoskeletal:  Negative for myalgias.  Neurological:  Positive for headaches.  Psychiatric/Behavioral:  Negative for depression. The patient is not nervous/anxious.     Past Medical History:  Diagnosis Date   Actinic keratosis    Arthritis    Asthma    Back injury 2019   Fracture   Basal cell carcinoma    COPD (chronic obstructive pulmonary disease) (HCC)    GERD (gastroesophageal reflux disease)    H/O blood clots 2020   Lung, Per [facility] New Patient Packet   H/O mammogram 2022   Per [facility] New Patient Packet   Heart murmur    hx of in childhood    High cholesterol    Hyperlipidemia    Hypertension    Hypothyroidism    Kidney disease    Per [facility] New Patient Packet   MCNS (minimal change nephrotic syndrome)    OSA (obstructive sleep apnea) 08/29/2019   Osteopenia    Shortness of breath  dyspnea    on exertion    Squamous cell carcinoma    Thyroid disease    Per [facility] New Patient Packet   Past Surgical History:  Procedure Laterality Date   ABDOMINAL HYSTERECTOMY     APPENDECTOMY     BILATERAL SALPINGOOPHORECTOMY     Ovarian Cysts    CHOLECYSTECTOMY N/A 06/17/2022   Procedure: LAPAROSCOPIC CHOLECYSTECTOMY;  Surgeon: Fritzi Mandes, MD;  Location: [facility];  Service: General;  Laterality: N/A;    COLONOSCOPY  2001   Dr.Mann, Per [facility] New Patient Packet   CONVERSION TO TOTAL KNEE Right 07/10/2014   Procedure: RIGHT CONVERSION OF PARTIAL KNEE TO TOTAL KNEE;  Surgeon: [name], MD;  Location: [facility];  Service: Orthopedics;  Laterality: Right;   ENDOSCOPIC RETROGRADE CHOLANGIOPANCREATOGRAPHY (ERCP) WITH PROPOFOL N/A 06/15/2022   Procedure: ENDOSCOPIC RETROGRADE CHOLANGIOPANCREATOGRAPHY (ERCP) WITH PROPOFOL;  Surgeon: Vida Rigger, MD;  Location: [facility];  Service: Gastroenterology;  Laterality: N/A;   INTRAMEDULLARY (IM) NAIL INTERTROCHANTERIC Left 11/02/2022   Procedure: INTRAMEDULLARY NAILING OF LEFT FEMUR;  Surgeon: Tarry Kos, MD;  Location: [facility];  Service: Orthopedics;  Laterality: Left;   IR ANGIOGRAM PULMONARY BILATERAL SELECTIVE  02/08/2019   IR ANGIOGRAM SELECTIVE EACH ADDITIONAL VESSEL  02/08/2019   IR ANGIOGRAM SELECTIVE EACH ADDITIONAL VESSEL  02/08/2019   IR INFUSION THROMBOL ARTERIAL INITIAL (MS)  02/08/2019   IR INFUSION THROMBOL ARTERIAL INITIAL (MS)  02/08/2019   IR THROMB F/U EVAL ART/VEN FINAL DAY (MS)  02/09/2019   IR US GUIDE VASC ACCESS RIGHT  02/08/2019   MEDIAL PARTIAL KNEE REPLACEMENT Bilateral    MOHS SURGERY     x 2   REMOVAL OF STONES  06/15/2022   Procedure: REMOVAL OF STONES;  Surgeon: Vida Rigger, MD;  Location: [facility];  Service: Gastroenterology;;   RENAL BIOPSY     x 2   REPLACEMENT TOTAL KNEE  2012   Per [facility] New Patient Packet   ROTATOR CUFF REPAIR Left 2008   SPHINCTEROTOMY  06/15/2022   Procedure: SPHINCTEROTOMY;  Surgeon: Vida Rigger, MD;  Location: [facility];  Service: Gastroenterology;;   Social History:   reports that she quit smoking about 50 years ago. Her smoking use included cigarettes. She started smoking about 75 years ago. She has a 37.5 pack-year smoking history. She has never used smokeless tobacco. She reports current alcohol use of about 0.5 standard drinks of alcohol per week. She reports that she does not use  drugs.  Family History  Problem Relation Age of Onset   COPD Mother        Husband Smoked    Lymphoma Father    Hypertension Sister    Scoliosis Daughter    Stroke Maternal Grandfather    Rheumatic fever Paternal Grandmother    Heart disease Paternal Grandfather     Medications: Patient's Medications  New Prescriptions   No medications on file  Previous Medications   ALBUTEROL (VENTOLIN HFA) 108 (90 BASE) MCG/ACT INHALER    Inhale 1-2 puffs into the lungs every 6 (six) hours as needed for wheezing or shortness of breath.   CITALOPRAM (CELEXA) 20 MG TABLET    Take 1 tablet (20 mg total) by mouth at bedtime. DX F41.9   DOCUSATE SODIUM (COLACE) 100 MG CAPSULE    Take 1 capsule (100 mg total) by mouth 2 (two) times daily.   DONEPEZIL (ARICEPT) 10 MG TABLET    TAKE 1 TABLET BY MOUTH EVERYDAY AT BEDTIME   ELIQUIS 5 MG  TABS TABLET    TAKE 1 TABLET BY MOUTH TWICE A DAY   FERROUS SULFATE 325 (65 FE) MG EC TABLET    TAKE 1 TABLET BY MOUTH EVERY DAY WITH BREAKFAST   FLUTICASONE (FLONASE) 50 MCG/ACT NASAL SPRAY    SPRAY 1 SPRAY INTO EACH NOSTRIL DAILY   GUAIFENESIN (MUCINEX) 600 MG 12 HR TABLET    Take 1,200 mg by mouth as needed for to loosen phlegm.   IPRATROPIUM (ATROVENT) 0.03 % NASAL SPRAY    SPRAY 2 SPRAYS INTO EACH NOSTRIL 3 TIMES A DAY AS NEEDED FOR RHINITIS   LEVOTHYROXINE (SYNTHROID) 88 MCG TABLET    TAKE 1 TABLET BY MOUTH EVERY DAY IN THE MORNING   LOSARTAN (COZAAR) 100 MG TABLET    TAKE 1 TABLET BY MOUTH EVERY DAY   MELATONIN 10 MG TABS    Take 10 mg by mouth at bedtime.   METFORMIN (GLUCOPHAGE) 500 MG TABLET    Take 0.5 tablets (250 mg total) by mouth daily with breakfast.   METOPROLOL TARTRATE (LOPRESSOR) 25 MG TABLET    Take 1 tablet (25 mg total) by mouth 2 (two) times daily.   MONTELUKAST (SINGULAIR) 10 MG TABLET    TAKE 1 TABLET BY MOUTH EVERYDAY AT BEDTIME   MULTIPLE VITAMIN (MULTIVITAMIN WITH MINERALS) TABS TABLET    Take 1 tablet by mouth daily with breakfast.    PANTOPRAZOLE (PROTONIX) 40 MG TABLET    Take 1 tablet (40 mg total) by mouth at bedtime.   ROPINIROLE (REQUIP) 1 MG TABLET    Take 1 tablet (1 mg total) by mouth 2 (two) times daily.   ROSUVASTATIN (CRESTOR) 20 MG TABLET    Take 1 tablet (20 mg total) by mouth daily.   TACROLIMUS (PROGRAF) 1 MG CAPSULE    Take 1 mg by mouth in the morning and at bedtime.   TRAMADOL (ULTRAM) 50 MG TABLET    Take 1 tablet (50 mg total) by mouth every 12 (twelve) hours as needed.   TRELEGY ELLIPTA 100-62.5-25 MCG/ACT AEPB    INHALE 1 PUFF BY MOUTH EVERY DAY   TYLENOL 325 MG CAPS    Take 325-650 mg by mouth every 6 (six) hours as needed (for pain or headaches).  Modified Medications   No medications on file  Discontinued Medications   CHOLECALCIFEROL (VITAMIN D3 PO)    Take 1 capsule by mouth daily.    Physical Exam:  There were no vitals filed for this visit. There is no height or weight on file to calculate BMI. Wt Readings from Last 3 Encounters:  02/15/23 214 lb 6.4 oz (97.3 kg)  12/23/22 211 lb 9.6 oz (96 kg)  11/06/22 224 lb 6.9 oz (101.8 kg)    Physical Exam Vitals reviewed: exam limited due to video encounter.  Constitutional:      General: She is not in acute distress. Neurological:     Mental Status: She is alert.     Labs reviewed: Basic Metabolic Panel: Recent Labs    06/17/22 0358 06/18/22 0416 11/04/22 0305 11/05/22 0417 11/06/22 1112 12/23/22 1501  NA 136   < > 136 138 141 141  K 3.3*   < > 4.7 4.9 4.6 4.9  CL 98   < > 102 104 104 103  CO2 29   < > 27 26 28 28   GLUCOSE 135*   < > 133* 144* 182* 99  BUN 14   < > 47* 50* 40* 23  CREATININE 0.91   < >  1.42* 1.31* 1.02* 1.19*  CALCIUM 8.0*   < > 8.3* 8.2* 8.6* 9.7  MG 1.5*   < > 2.3 2.2 2.1  --   PHOS 3.2  --   --  3.8 3.8  --   TSH  --   --   --   --   --  2.91   < > = values in this interval not displayed.   Liver Function Tests: Recent Labs    06/20/22 0352 06/21/22 0352 07/31/22 1533 11/05/22 0417  AST 21 18 15  24   ALT 31 25 11 12   ALKPHOS 41 46  --  34*  BILITOT 0.6 0.7 0.6 0.6  PROT 6.7 7.1 7.1 5.7*  ALBUMIN 2.9* 3.1*  --  2.3*   Recent Labs    06/13/22 0800 06/15/22 1015  LIPASE 3,094* 47   No results for input(s): "AMMONIA" in the last 8760 hours. CBC: Recent Labs    11/02/22 0403 11/03/22 0433 11/04/22 0305 11/05/22 0417 11/05/22 0854 11/05/22 1711 12/23/22 1501  WBC 11.0* 11.5* 11.2* 9.7  --   --  10.0  NEUTROABS 7.4 9.1*  --   --   --   --  6,850  HGB 10.8* 9.3* 8.2* 7.4* 7.7* 8.4* 12.8  HCT 35.1* 30.2* 26.5* 24.2* 24.7* 27.0* 40.6  MCV 94.4 93.2 94.0 92.0  --   --  94.0  PLT 211 194 198 229  --   --  319   Lipid Panel: Recent Labs    12/23/22 1501  CHOL 112  HDL 52  LDLCALC 33  TRIG 204*  CHOLHDL 2.2   TSH: Recent Labs    12/23/22 1501  TSH 2.91   A1C: Lab Results  Component Value Date   HGBA1C 5.2 12/23/2022     Assessment/Plan 1. Persistent dry cough (Primary) - began 01/25 - cough non productive, keeping her up at night, afebrile - will start prednisone and tessalon for symptoms - advised to contact provider if symptoms worsen or do not resolve - predniSONE (DELTASONE) 20 MG tablet; Take 2 tablets (40 mg total) by mouth daily with breakfast for 5 days.  Dispense: 10 tablet; Refill: 0 - benzonatate (TESSALON) 200 MG capsule; Take 1 capsule (200 mg total) by mouth 3 (three) times daily as needed for cough.  Dispense: 30 capsule; Refill: 0  2. Acute nonintractable headache, unspecified headache type - started 01/25 - relief with tylenol - cont tylenol prn  Total time: 21 minutes. Greater than 50% of total time spent doing patient education regarding dry cough and headache including symptom/medication management.    Virtual Visit   I connected with Haynes Kerns via virtual visit and verified that I am speaking with the correct person using two identifiers.  Location: [facility] Patient: [name] Provider: Octavia Heir, NP     I discussed the limitations, risks, security and privacy concerns of performing an evaluation and management service by telephone and the availability of in person appointments. I also discussed with the patient that there may be a patient responsible charge related to this service. The patient expressed understanding and agreed to proceed.   I discussed the assessment and treatment plan with the patient. The patient was provided an opportunity to ask questions and all were answered. The patient agreed with the plan and demonstrated an understanding of the instructions.   The patient was advised to call back or seek an in-person evaluation if the symptoms worsen or if the condition fails  to improve as anticipated.  I provided 18 minutes of face-to-face time during this encounter.  Regina Coppolino Norval Gable, NP  Avs printed and mailed    Next appt: Visit date not found  Yovan Leeman Scherry Ran  [facility] [phone]

## 2023-03-08 NOTE — Progress Notes (Signed)
   This service is provided via telemedicine  No vital signs collected/recorded due to the encounter was a telemedicine visit.   Location of patient (ex: home, work):  Home  Patient consents to a telephone visit:  Yes  Location of the provider (ex: office, home):  [employer].   Name of any referring provider:  [name], NP   Names of all persons participating in the telemedicine service and their role in the encounter:  Patient, Meda Klinefelter, RMA, [name], NP.    Time spent on call:  8 minutes spent on the phone with Medical Assistant.

## 2023-03-08 NOTE — Progress Notes (Deleted)
 Cardiology Office Note:    Date:  03/08/2023  ID:  [name] October 01, 1940, MRN [id number] PCP: [name], NP  [facility] Providers Cardiologist:  Peter [name], MD { Click to update primary MD,subspecialty MD or APP then REFRESH:1}    {Click to Open Review  :1}   Patient Profile:      [name] is a 83 y.o. 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  She was admitted in May 2024 for acute gallstone pancreatitis.  Had ERCP and then lap choly.  Postop she developed hypoxia due to volume overload and atelectasis.  She had atrial tachycardia during her hospital stay.  Her echocardiogram on 06/2022 showed LVEF 50-55%, grade 1 DD, RV SF normal, biatrial size mildly dilated, trivial MR, mild AS. She was discharged on metoprolol.  Later had an event monitor on 07/2022 showing recurrent SVT, 1516 runs were recorded, metoprolol dose was increased 37.5 mg twice daily..  She establish care with cardiology on 08/18/2022.  She was noted to be feeling much better, still has SOB secondary to COPD.  No medication changes were made.  She was to follow-up in 4 months. {There is no content from the last Narrative History section.}       History of Present Illness:  Discussed the use of AI scribe software for clinical note transcription with the patient, who gave verbal consent to proceed.  [name] is a 83 y.o. female who returns for ***Discussed the use of AI scribe software for clinical note transcription with the patient, who gave verbal consent to proceed.  History of Present Illness            ROS   See HPI ***     Home Medications:    Prior to Admission medications   Medication Sig Start Date End Date Taking? Authorizing Provider  albuterol (VENTOLIN HFA) 108 (90 Base) MCG/ACT inhaler Inhale 1-2 puffs into the lungs every 6 (six) hours as needed for  wheezing or shortness of breath.    [provider]  Cholecalciferol (VITAMIN D3 PO) Take 1 capsule by mouth daily.    [provider]  citalopram (CELEXA) 20 MG tablet Take 1 tablet (20 mg total) by mouth at bedtime. DX F41.9 08/10/22   [name], NP  docusate sodium (COLACE) 100 MG capsule Take 1 capsule (100 mg total) by mouth 2 (two) times daily. 11/06/22   Willeen Niece, MD  donepezil (ARICEPT) 10 MG tablet TAKE 1 TABLET BY MOUTH EVERYDAY AT BEDTIME Patient taking differently: Take 10 mg by mouth at bedtime. 08/10/22   [name], NP  ELIQUIS 5 MG TABS tablet TAKE 1 TABLET BY MOUTH TWICE A DAY 10/22/22   [name], MD  ferrous sulfate 325 (65 FE) MG EC tablet TAKE 1 TABLET BY MOUTH EVERY DAY WITH BREAKFAST 01/14/23   [name], NP  fluticasone (FLONASE) 50 MCG/ACT nasal spray SPRAY 1 SPRAY INTO EACH NOSTRIL DAILY Patient taking differently: Place 1-2 sprays into both nostrils in the morning and at bedtime. 09/14/22   [name], MD  guaiFENesin (MUCINEX) 600 MG 12 hr tablet Take 1,200 mg by mouth as needed for to loosen phlegm. Patient not taking: Reported on 02/15/2023    [provider]  ipratropium (ATROVENT) 0.03 % nasal spray SPRAY 2 SPRAYS INTO EACH NOSTRIL 3 TIMES A DAY AS NEEDED FOR RHINITIS Patient taking differently: Place  1 spray into both nostrils See admin instructions. Instill 1 spray into each nostril in the morning and at bedtime- as needed for unresolved allergic symptoms 09/14/22   [name], MD  levothyroxine (SYNTHROID) 88 MCG tablet TAKE 1 TABLET BY MOUTH EVERY DAY IN THE MORNING Patient taking differently: Take 88 mcg by mouth daily before breakfast. 10/22/22   [name], NP  losartan (COZAAR) 100 MG tablet TAKE 1 TABLET BY MOUTH EVERY DAY Patient taking differently: Take 100 mg by mouth daily. 10/22/22   [name], NP  Melatonin 10 MG TABS Take 10 mg by mouth at bedtime.    [provider]   metFORMIN (GLUCOPHAGE) 500 MG tablet Take 0.5 tablets (250 mg total) by mouth daily with breakfast. 02/15/23   [name], NP  metoprolol tartrate (LOPRESSOR) 25 MG tablet Take 1 tablet (25 mg total) by mouth 2 (two) times daily. 11/06/22 02/15/23  Willeen Niece, MD  montelukast (SINGULAIR) 10 MG tablet TAKE 1 TABLET BY MOUTH EVERYDAY AT BEDTIME 03/12/22   [name], MD  Multiple Vitamin (MULTIVITAMIN WITH MINERALS) TABS tablet Take 1 tablet by mouth daily with breakfast.    [provider]  pantoprazole (PROTONIX) 40 MG tablet Take 1 tablet (40 mg total) by mouth at bedtime. 01/14/23   [name], NP  rOPINIRole (REQUIP) 1 MG tablet Take 1 tablet (1 mg total) by mouth 2 (two) times daily. 02/15/23   [name], NP  rosuvastatin (CRESTOR) 20 MG tablet Take 1 tablet (20 mg total) by mouth daily. Patient taking differently: Take 20 mg by mouth at bedtime. 08/18/22 02/15/23  [name], [name], MD  tacrolimus (PROGRAF) 1 MG capsule Take 1 mg by mouth in the morning and at bedtime. 01/03/19   [provider]  traMADol (ULTRAM) 50 MG tablet Take 1 tablet (50 mg total) by mouth every 12 (twelve) hours as needed. 01/19/23   Jari Sportsman L, PA-C  TRELEGY ELLIPTA 100-62.5-25 MCG/ACT AEPB INHALE 1 PUFF BY MOUTH EVERY DAY Patient taking differently: Inhale 1 puff into the lungs in the morning. 08/25/22   [name], MD  TYLENOL 325 MG CAPS Take 325-650 mg by mouth every 6 (six) hours as needed (for pain or headaches).    [provider]   Studies Reviewed:       *** Risk Assessment/Calculations:   {Does this patient have ATRIAL FIBRILLATION?:[id number]} No BP recorded.  {Refresh Note OR Click here to enter BP  :1}***       Physical Exam:   VS:  There were no vitals taken for this visit.   Wt Readings from Last 3 Encounters:  02/15/23 214 lb 6.4 oz (97.3 kg)  12/23/22 211 lb 9.6 oz (96 kg)  11/06/22 224 lb 6.9 oz (101.8 kg)    Physical Exam***      Assessment and Plan:  Assessment and Plan              {Are you ordering a CV Procedure (e.g. stress test, cath, DCCV, TEE, etc)?   Press F2        :[id number]}  Dispo:  No follow-ups on file.  Signed, Denyce Robert, NP

## 2023-03-09 ENCOUNTER — Ambulatory Visit: Payer: Medicare PPO | Admitting: Physician Assistant

## 2023-03-16 ENCOUNTER — Ambulatory Visit: Payer: Medicare PPO | Admitting: Physician Assistant

## 2023-03-23 ENCOUNTER — Encounter: Payer: Self-pay | Admitting: Physician Assistant

## 2023-03-23 ENCOUNTER — Other Ambulatory Visit (INDEPENDENT_AMBULATORY_CARE_PROVIDER_SITE_OTHER): Payer: Medicare PPO

## 2023-03-23 ENCOUNTER — Ambulatory Visit: Payer: Medicare PPO | Admitting: Physician Assistant

## 2023-03-23 DIAGNOSIS — S72142A Displaced intertrochanteric fracture of left femur, initial encounter for closed fracture: Secondary | ICD-10-CM

## 2023-03-23 NOTE — Progress Notes (Signed)
Post-Op Visit Note   Patient: [name]           Date of Birth: November 05, 1940           MRN: [id number] Visit Date: 03/23/2023 PCP: [name], NP   Assessment & Plan:  Chief Complaint:  Chief Complaint  Patient presents with   Left Leg - Follow-up    IM nailing of femur 11/02/2022   Visit Diagnoses:  1. Closed comminuted intertrochanteric fracture of proximal femur, left, initial encounter [facility])     Plan: Patient is a pleasant 83 year old female who comes in today approximately 4-1/2 months status post left hip IM nail from intertrochanteric femur fracture, date of surgery 11/02/2022.  He has been doing much better.  She is taking tramadol and Tylenol for pain.  Ambulating with a walker.  She has been at home working on exercises.  Unfortunately, insurance denied continuation of home health physical therapy.  Examination left hip reveals a fully healed surgical scar.  She does have limited hip flexion secondary to weakness.  She is neurovascularly intact distally.  At this point, I would like the patient to have additional home health physical therapy as she is lacking strength of the left lower extremity.  We have sent in a referral for this.  She will follow-up with [place] in 2 months for repeat evaluation and left femur x-rays.  Call with concerns or questions.  Follow-Up Instructions: Return in about 2 months (around 05/21/2023).   Orders:  Orders Placed This Encounter  Procedures   XR FEMUR MIN 2 VIEWS LEFT   No orders of the defined types were placed in this encounter.   Imaging: XR FEMUR MIN 2 VIEWS LEFT Result Date: 03/23/2023 X-rays demonstrate consolidation to the fracture site.  No hardware complication.   PMFS History: Patient Active Problem List   Diagnosis Date Noted   Closed comminuted intertrochanteric fracture of proximal femur, left, initial encounter (HCC) 10/31/2022   Accidental fall 10/31/2022   Chronic anticoagulation 10/31/2022    History of pulmonary embolism 10/31/2022   History of PAT (paroxysmal atrial tachycardia) 10/31/2022   Closed left hip fracture, initial encounter (HCC) 10/31/2022   Acute pancreatitis 06/13/2022   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/26/2022   Acquired hypercoagulable state (HCC) 01/26/2022   Bilateral hearing loss 11/04/2020   Diabetes mellitus without complication (HCC) 11/04/2020   RLS (restless legs syndrome) 11/04/2020   OSA (obstructive sleep apnea) 08/29/2019   Abnormal findings on diagnostic imaging of lung 08/29/2019   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   Pulmonary embolism (HCC) 02/07/2019   GERD without esophagitis 05/15/2017   Dyslipidemia 05/15/2017   Anxiety 05/15/2017   Constipation due to opioid therapy 05/15/2017   Accidental fall from chair, initial encounter 05/08/2017   Closed posttraumatic compression fracture of lumbar vertebra (HCC) 05/08/2017   Leukocytosis 05/08/2017   S/P right TKA 07/10/2014   S/P knee replacement 07/10/2014   COPD (chronic obstructive pulmonary disease) (HCC) 09/24/2013   Hypothyroidism 07/24/2012   Essential hypertension, benign 07/24/2012   Allergic rhinitis 07/24/2012   Mixed hyperlipidemia 07/24/2012   Past Medical History:  Diagnosis Date   Actinic keratosis    Arthritis    Asthma    Back injury 2019   Fracture   Basal cell carcinoma    COPD (chronic obstructive pulmonary disease) (HCC)    GERD (gastroesophageal reflux disease)    H/O blood clots 2020   Lung, Per [facility] New Patient Packet   H/O  mammogram 2022   Per [facility] New Patient Packet   Heart murmur    hx of in childhood    High cholesterol    Hyperlipidemia    Hypertension    Hypothyroidism    Kidney disease    Per [facility] New Patient Packet   MCNS (minimal change nephrotic syndrome)    OSA (obstructive sleep apnea) 08/29/2019   Osteopenia    Shortness of breath dyspnea    on exertion    Squamous cell carcinoma    Thyroid disease    Per [facility] New  Patient Packet    Family History  Problem Relation Age of Onset   COPD Mother        Husband Smoked    Lymphoma Father    Hypertension Sister    Scoliosis Daughter    Stroke Maternal Grandfather    Rheumatic fever Paternal Grandmother    Heart disease Paternal Grandfather     Past Surgical History:  Procedure Laterality Date   ABDOMINAL HYSTERECTOMY     APPENDECTOMY     BILATERAL SALPINGOOPHORECTOMY     Ovarian Cysts    CHOLECYSTECTOMY N/A 06/17/2022   Procedure: LAPAROSCOPIC CHOLECYSTECTOMY;  Surgeon: Fritzi Mandes, MD;  Location: [facility];  Service: General;  Laterality: N/A;   COLONOSCOPY  2001   Dr.Mann, Per [facility] New Patient Packet   CONVERSION TO TOTAL KNEE Right 07/10/2014   Procedure: RIGHT CONVERSION OF PARTIAL KNEE TO TOTAL KNEE;  Surgeon: [name], MD;  Location: [facility];  Service: Orthopedics;  Laterality: Right;   ENDOSCOPIC RETROGRADE CHOLANGIOPANCREATOGRAPHY (ERCP) WITH PROPOFOL N/A 06/15/2022   Procedure: ENDOSCOPIC RETROGRADE CHOLANGIOPANCREATOGRAPHY (ERCP) WITH PROPOFOL;  Surgeon: Vida Rigger, MD;  Location: [facility];  Service: Gastroenterology;  Laterality: N/A;   INTRAMEDULLARY (IM) NAIL INTERTROCHANTERIC Left 11/02/2022   Procedure: INTRAMEDULLARY NAILING OF LEFT FEMUR;  Surgeon: Tarry Kos, MD;  Location: [facility];  Service: Orthopedics;  Laterality: Left;   IR ANGIOGRAM PULMONARY BILATERAL SELECTIVE  02/08/2019   IR ANGIOGRAM SELECTIVE EACH ADDITIONAL VESSEL  02/08/2019   IR ANGIOGRAM SELECTIVE EACH ADDITIONAL VESSEL  02/08/2019   IR INFUSION THROMBOL ARTERIAL INITIAL (MS)  02/08/2019   IR INFUSION THROMBOL ARTERIAL INITIAL (MS)  02/08/2019   IR THROMB F/U EVAL ART/VEN FINAL DAY (MS)  02/09/2019   IR US GUIDE VASC ACCESS RIGHT  02/08/2019   MEDIAL PARTIAL KNEE REPLACEMENT Bilateral    MOHS SURGERY     x 2   REMOVAL OF STONES  06/15/2022   Procedure: REMOVAL OF STONES;  Surgeon: Vida Rigger, MD;  Location: [facility];  Service: Gastroenterology;;   RENAL  BIOPSY     x 2   REPLACEMENT TOTAL KNEE  2012   Per [facility] New Patient Packet   ROTATOR CUFF REPAIR Left 2008   SPHINCTEROTOMY  06/15/2022   Procedure: SPHINCTEROTOMY;  Surgeon: Vida Rigger, MD;  Location: [facility];  Service: Gastroenterology;;   Social History   Occupational History   Occupation: TEACHER     Employer: [employer]    Comment: RETIRED   Tobacco Use   Smoking status: Former    Current packs/day: 0.00    Average packs/day: 1.5 packs/day for 25.0 years (37.5 ttl pk-yrs)    Types: Cigarettes    Start date: 01/13/1948    Quit date: 01/12/1973    Years since quitting: 50.2   Smokeless tobacco: Never  Vaping Use   Vaping status: Never Used  Substance and Sexual Activity   Alcohol use: Yes  Alcohol/week: 0.5 standard drinks of alcohol    Types: 1 Standard drinks or equivalent per week    Comment: She occasionally drinks a glass of wine.     Drug use: No   Sexual activity: Not Currently    Partners: Male

## 2023-03-24 NOTE — Addendum Note (Signed)
Addended by: Wendi Maya on: 03/24/2023 09:34 AM   Modules accepted: Orders

## 2023-03-25 DIAGNOSIS — D225 Melanocytic nevi of trunk: Secondary | ICD-10-CM | POA: Diagnosis not present

## 2023-03-25 DIAGNOSIS — L720 Epidermal cyst: Secondary | ICD-10-CM | POA: Diagnosis not present

## 2023-03-25 DIAGNOSIS — D0462 Carcinoma in situ of skin of left upper limb, including shoulder: Secondary | ICD-10-CM | POA: Diagnosis not present

## 2023-03-25 DIAGNOSIS — L814 Other melanin hyperpigmentation: Secondary | ICD-10-CM | POA: Diagnosis not present

## 2023-03-25 DIAGNOSIS — D0472 Carcinoma in situ of skin of left lower limb, including hip: Secondary | ICD-10-CM | POA: Diagnosis not present

## 2023-03-25 DIAGNOSIS — L821 Other seborrheic keratosis: Secondary | ICD-10-CM | POA: Diagnosis not present

## 2023-03-25 DIAGNOSIS — D2262 Melanocytic nevi of left upper limb, including shoulder: Secondary | ICD-10-CM | POA: Diagnosis not present

## 2023-03-25 DIAGNOSIS — L578 Other skin changes due to chronic exposure to nonionizing radiation: Secondary | ICD-10-CM | POA: Diagnosis not present

## 2023-03-25 DIAGNOSIS — D045 Carcinoma in situ of skin of trunk: Secondary | ICD-10-CM | POA: Diagnosis not present

## 2023-03-25 DIAGNOSIS — Z85828 Personal history of other malignant neoplasm of skin: Secondary | ICD-10-CM | POA: Diagnosis not present

## 2023-03-25 DIAGNOSIS — D485 Neoplasm of uncertain behavior of skin: Secondary | ICD-10-CM | POA: Diagnosis not present

## 2023-03-25 DIAGNOSIS — L570 Actinic keratosis: Secondary | ICD-10-CM | POA: Diagnosis not present

## 2023-03-25 DIAGNOSIS — D0471 Carcinoma in situ of skin of right lower limb, including hip: Secondary | ICD-10-CM | POA: Diagnosis not present

## 2023-03-26 ENCOUNTER — Other Ambulatory Visit: Payer: Self-pay

## 2023-03-26 MED ORDER — APIXABAN 5 MG PO TABS: 5.0000 mg | ORAL_TABLET | Freq: Two times a day (BID) | ORAL | 0 refills | Status: DC

## 2023-03-29 DIAGNOSIS — E119 Type 2 diabetes mellitus without complications: Secondary | ICD-10-CM | POA: Diagnosis not present

## 2023-03-29 DIAGNOSIS — M80052D Age-related osteoporosis with current pathological fracture, left femur, subsequent encounter for fracture with routine healing: Secondary | ICD-10-CM | POA: Diagnosis not present

## 2023-03-29 DIAGNOSIS — F419 Anxiety disorder, unspecified: Secondary | ICD-10-CM | POA: Diagnosis not present

## 2023-03-29 DIAGNOSIS — H9193 Unspecified hearing loss, bilateral: Secondary | ICD-10-CM | POA: Diagnosis not present

## 2023-03-29 DIAGNOSIS — D649 Anemia, unspecified: Secondary | ICD-10-CM | POA: Diagnosis not present

## 2023-03-29 DIAGNOSIS — I110 Hypertensive heart disease with heart failure: Secondary | ICD-10-CM | POA: Diagnosis not present

## 2023-03-29 DIAGNOSIS — I509 Heart failure, unspecified: Secondary | ICD-10-CM | POA: Diagnosis not present

## 2023-03-29 DIAGNOSIS — J4489 Other specified chronic obstructive pulmonary disease: Secondary | ICD-10-CM | POA: Diagnosis not present

## 2023-03-29 DIAGNOSIS — G2581 Restless legs syndrome: Secondary | ICD-10-CM | POA: Diagnosis not present

## 2023-03-30 DIAGNOSIS — N1831 Chronic kidney disease, stage 3a: Secondary | ICD-10-CM | POA: Diagnosis not present

## 2023-03-30 DIAGNOSIS — N050 Unspecified nephritic syndrome with minor glomerular abnormality: Secondary | ICD-10-CM | POA: Diagnosis not present

## 2023-03-30 DIAGNOSIS — I129 Hypertensive chronic kidney disease with stage 1 through stage 4 chronic kidney disease, or unspecified chronic kidney disease: Secondary | ICD-10-CM | POA: Diagnosis not present

## 2023-03-30 DIAGNOSIS — E1122 Type 2 diabetes mellitus with diabetic chronic kidney disease: Secondary | ICD-10-CM | POA: Diagnosis not present

## 2023-03-30 DIAGNOSIS — I10 Essential (primary) hypertension: Secondary | ICD-10-CM | POA: Diagnosis not present

## 2023-03-30 DIAGNOSIS — Z6835 Body mass index (BMI) 35.0-35.9, adult: Secondary | ICD-10-CM | POA: Diagnosis not present

## 2023-04-02 DIAGNOSIS — G2581 Restless legs syndrome: Secondary | ICD-10-CM | POA: Diagnosis not present

## 2023-04-02 DIAGNOSIS — I509 Heart failure, unspecified: Secondary | ICD-10-CM | POA: Diagnosis not present

## 2023-04-02 DIAGNOSIS — H9193 Unspecified hearing loss, bilateral: Secondary | ICD-10-CM | POA: Diagnosis not present

## 2023-04-02 DIAGNOSIS — F419 Anxiety disorder, unspecified: Secondary | ICD-10-CM | POA: Diagnosis not present

## 2023-04-02 DIAGNOSIS — I110 Hypertensive heart disease with heart failure: Secondary | ICD-10-CM | POA: Diagnosis not present

## 2023-04-02 DIAGNOSIS — E119 Type 2 diabetes mellitus without complications: Secondary | ICD-10-CM | POA: Diagnosis not present

## 2023-04-02 DIAGNOSIS — J4489 Other specified chronic obstructive pulmonary disease: Secondary | ICD-10-CM | POA: Diagnosis not present

## 2023-04-02 DIAGNOSIS — D649 Anemia, unspecified: Secondary | ICD-10-CM | POA: Diagnosis not present

## 2023-04-02 DIAGNOSIS — M80052D Age-related osteoporosis with current pathological fracture, left femur, subsequent encounter for fracture with routine healing: Secondary | ICD-10-CM | POA: Diagnosis not present

## 2023-04-05 DIAGNOSIS — F419 Anxiety disorder, unspecified: Secondary | ICD-10-CM | POA: Diagnosis not present

## 2023-04-05 DIAGNOSIS — I110 Hypertensive heart disease with heart failure: Secondary | ICD-10-CM | POA: Diagnosis not present

## 2023-04-05 DIAGNOSIS — E119 Type 2 diabetes mellitus without complications: Secondary | ICD-10-CM | POA: Diagnosis not present

## 2023-04-05 DIAGNOSIS — G2581 Restless legs syndrome: Secondary | ICD-10-CM | POA: Diagnosis not present

## 2023-04-05 DIAGNOSIS — I509 Heart failure, unspecified: Secondary | ICD-10-CM | POA: Diagnosis not present

## 2023-04-05 DIAGNOSIS — H9193 Unspecified hearing loss, bilateral: Secondary | ICD-10-CM | POA: Diagnosis not present

## 2023-04-05 DIAGNOSIS — J4489 Other specified chronic obstructive pulmonary disease: Secondary | ICD-10-CM | POA: Diagnosis not present

## 2023-04-05 DIAGNOSIS — D649 Anemia, unspecified: Secondary | ICD-10-CM | POA: Diagnosis not present

## 2023-04-05 DIAGNOSIS — M80052D Age-related osteoporosis with current pathological fracture, left femur, subsequent encounter for fracture with routine healing: Secondary | ICD-10-CM | POA: Diagnosis not present

## 2023-04-08 DIAGNOSIS — I110 Hypertensive heart disease with heart failure: Secondary | ICD-10-CM | POA: Diagnosis not present

## 2023-04-08 DIAGNOSIS — D649 Anemia, unspecified: Secondary | ICD-10-CM | POA: Diagnosis not present

## 2023-04-08 DIAGNOSIS — H9193 Unspecified hearing loss, bilateral: Secondary | ICD-10-CM | POA: Diagnosis not present

## 2023-04-08 DIAGNOSIS — F419 Anxiety disorder, unspecified: Secondary | ICD-10-CM | POA: Diagnosis not present

## 2023-04-08 DIAGNOSIS — M80052D Age-related osteoporosis with current pathological fracture, left femur, subsequent encounter for fracture with routine healing: Secondary | ICD-10-CM | POA: Diagnosis not present

## 2023-04-08 DIAGNOSIS — E119 Type 2 diabetes mellitus without complications: Secondary | ICD-10-CM | POA: Diagnosis not present

## 2023-04-08 DIAGNOSIS — J4489 Other specified chronic obstructive pulmonary disease: Secondary | ICD-10-CM | POA: Diagnosis not present

## 2023-04-08 DIAGNOSIS — G2581 Restless legs syndrome: Secondary | ICD-10-CM | POA: Diagnosis not present

## 2023-04-08 DIAGNOSIS — I509 Heart failure, unspecified: Secondary | ICD-10-CM | POA: Diagnosis not present

## 2023-04-09 ENCOUNTER — Other Ambulatory Visit: Payer: Self-pay | Admitting: Physician Assistant

## 2023-04-09 ENCOUNTER — Other Ambulatory Visit: Payer: Self-pay | Admitting: Nurse Practitioner

## 2023-04-09 NOTE — Telephone Encounter (Signed)
 Is she asking for this or is this automatic from pharmacy?  If she needs a rx, we can do one more and will then need to refer to pain mgmt if she still needs it

## 2023-04-09 NOTE — Telephone Encounter (Signed)
 Patient given 30 day supply of medication. Prescription pend and sent to PCP Janyth Contes Janene Harvey, NP for approval.

## 2023-04-15 ENCOUNTER — Encounter: Payer: Self-pay | Admitting: Nurse Practitioner

## 2023-04-15 ENCOUNTER — Ambulatory Visit: Payer: Medicare PPO | Attending: Nurse Practitioner | Admitting: Nurse Practitioner

## 2023-04-15 VITALS — BP 126/78 | Ht 66.0 in | Wt 218.0 lb

## 2023-04-15 DIAGNOSIS — I350 Nonrheumatic aortic (valve) stenosis: Secondary | ICD-10-CM | POA: Diagnosis not present

## 2023-04-15 DIAGNOSIS — G4733 Obstructive sleep apnea (adult) (pediatric): Secondary | ICD-10-CM

## 2023-04-15 DIAGNOSIS — I10 Essential (primary) hypertension: Secondary | ICD-10-CM

## 2023-04-15 DIAGNOSIS — I4719 Other supraventricular tachycardia: Secondary | ICD-10-CM | POA: Diagnosis not present

## 2023-04-15 DIAGNOSIS — J449 Chronic obstructive pulmonary disease, unspecified: Secondary | ICD-10-CM | POA: Diagnosis not present

## 2023-04-15 DIAGNOSIS — E782 Mixed hyperlipidemia: Secondary | ICD-10-CM | POA: Diagnosis not present

## 2023-04-15 DIAGNOSIS — Z86711 Personal history of pulmonary embolism: Secondary | ICD-10-CM

## 2023-04-15 NOTE — Patient Instructions (Addendum)
 Medication Instructions:  Your physician recommends that you continue on your current medications as directed. Please refer to the Current Medication list given to you today.  *If you need a refill on your cardiac medications before your next appointment, please call your pharmacy*   Lab Work: NONE ordered at this time of appointment    Testing/Procedures: Your physician has requested that you have an echocardiogram May 2025. Echocardiography is a painless test that uses sound waves to create images of your heart. It provides your doctor with information about the size and shape of your heart and how well your heart's chambers and valves are working. This procedure takes approximately one hour. There are no restrictions for this procedure. Please do NOT wear cologne, perfume, aftershave, or lotions (deodorant is allowed). Please arrive 15 minutes prior to your appointment time.  Please note: We ask at that you not bring children with you during ultrasound (echo/ vascular) testing. Due to room size and safety concerns, children are not allowed in the ultrasound rooms during exams. Our front office staff cannot provide observation of children in our lobby area while testing is being conducted. An adult accompanying a patient to their appointment will only be allowed in the ultrasound room at the discretion of the ultrasound technician under special circumstances. We apologize for any inconvenience.    Follow-Up: At [facility], you and your health needs are our priority.  As part of our continuing mission to provide you with exceptional heart care, we have created designated Provider Care Teams.  These Care Teams include your primary Cardiologist (physician) and Advanced Practice Providers (APPs -  Physician Assistants and Nurse Practitioners) who all work together to provide you with the care you need, when you need it.  We recommend signing up for the patient portal called "MyChart".   Sign up information is provided on this After Visit Summary.  MyChart is used to connect with patients for Virtual Visits (Telemedicine).  Patients are able to view lab/test results, encounter notes, upcoming appointments, etc.  Non-urgent messages can be sent to your provider as well.   To learn more about what you can do with MyChart, go to [email or web address].    Your next appointment:   1 year(s)  Provider:   Peter [name], MD     Other Instructions

## 2023-04-15 NOTE — Progress Notes (Signed)
 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  Past Medical History    Past Medical History:  Diagnosis Date   Actinic keratosis    Arthritis    Asthma    Back injury 2019   Fracture   Basal cell carcinoma    COPD (chronic obstructive pulmonary disease) (HCC)    GERD (gastroesophageal reflux disease)    H/O blood clots 2020   Lung, Per [facility] New Patient Packet   H/O mammogram 2022   Per [facility] New Patient Packet   Heart murmur    hx of in childhood    High cholesterol    Hyperlipidemia    Hypertension    Hypothyroidism    Kidney disease    Per [facility] New Patient Packet   MCNS (minimal change nephrotic syndrome)    OSA (obstructive sleep apnea) 08/29/2019   Osteopenia    Shortness of breath dyspnea    on exertion    Squamous cell carcinoma    Thyroid disease    Per [facility] New Patient Packet   Past Surgical History:  Procedure Laterality Date   ABDOMINAL HYSTERECTOMY     APPENDECTOMY     BILATERAL SALPINGOOPHORECTOMY     Ovarian Cysts    CHOLECYSTECTOMY N/A 06/17/2022   Procedure: LAPAROSCOPIC CHOLECYSTECTOMY;  Surgeon: Fritzi Mandes, MD;  Location: [facility];  Service: General;  Laterality: N/A;   COLONOSCOPY  2001   Dr.Mann, Per [facility] New Patient Packet   CONVERSION TO TOTAL KNEE Right 07/10/2014   Procedure: RIGHT CONVERSION OF PARTIAL KNEE TO TOTAL KNEE;  Surgeon: [name], MD;  Location: [facility];  Service: Orthopedics;  Laterality: Right;   ENDOSCOPIC RETROGRADE CHOLANGIOPANCREATOGRAPHY (ERCP) WITH PROPOFOL  N/A 06/15/2022   Procedure: ENDOSCOPIC RETROGRADE CHOLANGIOPANCREATOGRAPHY (ERCP) WITH PROPOFOL;  Surgeon: Vida Rigger, MD;  Location: [facility];  Service: Gastroenterology;  Laterality: N/A;   INTRAMEDULLARY (IM) NAIL INTERTROCHANTERIC Left 11/02/2022   Procedure: INTRAMEDULLARY NAILING OF LEFT FEMUR;  Surgeon: Tarry Kos, MD;  Location: [facility];  Service: Orthopedics;  Laterality: Left;   IR ANGIOGRAM PULMONARY BILATERAL SELECTIVE  02/08/2019   IR ANGIOGRAM SELECTIVE EACH ADDITIONAL VESSEL  02/08/2019   IR ANGIOGRAM SELECTIVE EACH ADDITIONAL VESSEL  02/08/2019   IR INFUSION THROMBOL ARTERIAL INITIAL (MS)  02/08/2019   IR INFUSION THROMBOL ARTERIAL INITIAL (MS)  02/08/2019   IR THROMB F/U EVAL ART/VEN FINAL DAY (MS)  02/09/2019   IR US GUIDE VASC ACCESS RIGHT  02/08/2019   MEDIAL PARTIAL KNEE REPLACEMENT Bilateral    MOHS SURGERY     x 2   REMOVAL OF STONES  06/15/2022   Procedure: REMOVAL OF STONES;  Surgeon: Vida Rigger, MD;  Location: [facility];  Service: Gastroenterology;;   RENAL BIOPSY     x 2   REPLACEMENT TOTAL KNEE  2012   Per [facility] New Patient Packet   ROTATOR CUFF REPAIR Left 2008   SPHINCTEROTOMY  06/15/2022   Procedure: SPHINCTEROTOMY;  Surgeon: Vida Rigger, MD;  Location: [facility];  Service: Gastroenterology;;    Allergies  No Known Allergies   Labs/Other Studies Reviewed  The following studies were reviewed today:  Cardiac Studies & Procedures   ______________________________________________________________________________________________     ECHOCARDIOGRAM  ECHOCARDIOGRAM COMPLETE 06/15/2022  Narrative ECHOCARDIOGRAM REPORT    Patient Name:   TAL NEER Date of Exam: 06/15/2022 Medical Rec #:  [id number]              Height:       68.0 in Accession #:    [id number]             Weight:       230.6 lb Date of Birth:  August 17, 1940              BSA:          2.171 m Patient Age:    81 years               BP:           162/71 mmHg Patient  Gender: F                      HR:           62 bpm. Exam Location:  Inpatient  Procedure: 2D Echo, Cardiac Doppler and Color Doppler  Indications:    Abnormal EKG  History:        Patient has prior history of Echocardiogram examinations, most recent 04/11/2019. COPD and Acute Pancreatitis; Risk Factors:Hypertension, Dyslipidemia, Sleep Apnea and Diabetes.  Sonographer:    NA Referring Phys: [id number] CALDWELL POWELL JR  IMPRESSIONS   1. Left ventricular ejection fraction, by estimation, is 50 to 55%. The left ventricle has low normal function. The left ventricle has no regional wall motion abnormalities. There is mild concentric left ventricular hypertrophy. Left ventricular diastolic parameters are consistent with Grade I diastolic dysfunction (impaired relaxation). 2. Right ventricular systolic function is normal. The right ventricular size is normal. Tricuspid regurgitation signal is inadequate for assessing PA pressure. 3. Left atrial size was mildly dilated. 4. Right atrial size was mildly dilated. 5. The mitral valve is normal in structure. Trivial mitral valve regurgitation. No evidence of mitral stenosis. 6. The aortic valve is tricuspid. There is mild calcification of the aortic valve. Aortic valve regurgitation is not visualized. Mild aortic valve stenosis. Aortic valve area, by VTI measures 1.67 cm. Aortic valve mean gradient measures 9.0 mmHg. 7. The inferior vena cava is dilated in size with <50% respiratory variability, suggesting right atrial pressure of 15 mmHg.  FINDINGS Left Ventricle: Left ventricular ejection fraction, by estimation, is 50 to 55%. The left ventricle has low normal function. The left ventricle has no regional wall motion abnormalities. The left ventricular internal cavity size was normal in size. There is mild concentric left ventricular hypertrophy. Left ventricular diastolic parameters are consistent with Grade I diastolic dysfunction (impaired  relaxation).  Right Ventricle: The right ventricular size is normal. No increase in right ventricular wall thickness. Right ventricular systolic function is normal. Tricuspid regurgitation signal is inadequate for assessing PA pressure.  Left Atrium: Left atrial size was mildly dilated.  Right Atrium: Right atrial size was mildly dilated.  Pericardium: There is no evidence of pericardial effusion.  Mitral Valve: The mitral valve is normal in structure. There is mild calcification of the mitral valve leaflet(s). Mild mitral annular calcification. Trivial mitral valve regurgitation. No evidence of mitral valve stenosis. MV peak gradient, 5.7 mmHg. The mean mitral valve gradient is 3.0 mmHg.  Tricuspid Valve: The tricuspid valve is normal in structure. Tricuspid valve  regurgitation is not demonstrated.  Aortic Valve: The aortic valve is tricuspid. There is mild calcification of the aortic valve. Aortic valve regurgitation is not visualized. Mild aortic stenosis is present. Aortic valve mean gradient measures 9.0 mmHg. Aortic valve peak gradient measures 15.2 mmHg. Aortic valve area, by VTI measures 1.67 cm.  Pulmonic Valve: The pulmonic valve was normal in structure. Pulmonic valve regurgitation is not visualized.  Aorta: The aortic root is normal in size and structure.  Venous: The inferior vena cava is dilated in size with less than 50% respiratory variability, suggesting right atrial pressure of 15 mmHg.  IAS/Shunts: No atrial level shunt detected by color flow Doppler.   LEFT VENTRICLE PLAX 2D LVIDd:         5.20 cm     Diastology LVIDs:         3.60 cm     LV e' medial:    8.05 cm/s LV PW:         1.20 cm     LV E/e' medial:  12.3 LV IVS:        1.20 cm     LV e' lateral:   8.59 cm/s LVOT diam:     1.90 cm     LV E/e' lateral: 11.6 LV SV:         79 LV SV Index:   36 LVOT Area:     2.84 cm  LV Volumes (MOD) LV vol d, MOD A2C: 91.6 ml LV vol d, MOD A4C: 91.5 ml LV vol s,  MOD A2C: 42.0 ml LV vol s, MOD A4C: 37.7 ml LV SV MOD A2C:     49.6 ml LV SV MOD A4C:     91.5 ml LV SV MOD BP:      52.5 ml  RIGHT VENTRICLE             IVC RV Basal diam:  3.60 cm     IVC diam: 2.70 cm RV S prime:     15.00 cm/s TAPSE (M-mode): 3.0 cm  LEFT ATRIUM             Index        RIGHT ATRIUM           Index LA diam:        3.80 cm 1.75 cm/m   RA Area:     21.30 cm LA Vol (A2C):   64.2 ml 29.57 ml/m  RA Volume:   61.70 ml  28.42 ml/m LA Vol (A4C):   69.3 ml 31.92 ml/m LA Biplane Vol: 68.2 ml 31.41 ml/m AORTIC VALVE AV Area (Vmax):    1.68 cm AV Area (Vmean):   1.67 cm AV Area (VTI):     1.67 cm AV Vmax:           195.00 cm/s AV Vmean:          140.000 cm/s AV VTI:            0.471 m AV Peak Grad:      15.2 mmHg AV Mean Grad:      9.0 mmHg LVOT Vmax:         115.50 cm/s LVOT Vmean:        82.400 cm/s LVOT VTI:          0.277 m LVOT/AV VTI ratio: 0.59  AORTA Ao Root diam: 2.80 cm Ao Asc diam:  3.40 cm  MITRAL VALVE MV Area (PHT): 3.45 cm     SHUNTS MV Area  VTI:   1.86 cm     Systemic VTI:  0.28 m MV Peak grad:  5.7 mmHg     Systemic Diam: 1.90 cm MV Mean grad:  3.0 mmHg MV Vmax:       1.19 m/s MV Vmean:      72.8 cm/s MV Decel Time: 220 msec MV E velocity: 99.30 cm/s MV A velocity: 115.00 cm/s MV E/A ratio:  0.86  Dalton McleanMD Electronically signed by Wilfred Lacy Signature Date/Time: 06/15/2022/4:03:24 PM    Final    MONITORS  LONG TERM MONITOR (3-14 DAYS) 07/17/2022  Narrative Patch Wear Time:  13 days and 23 hours (2024-05-15T14:27:01-399 to 2024-05-29T14:27:09-398)  Patient had a min HR of 49 bpm, max HR of 197 bpm, and avg HR of 66 bpm. Predominant underlying rhythm was Sinus Rhythm. 1 run of Ventricular Tachycardia occurred lasting 22.1 secs with a max rate of 158 bpm (avg 115 bpm). 1516 Supraventricular Tachycardia runs occurred, the run with the fastest interval lasting 12.1 secs with a max rate of 197 bpm, the longest  lasting 38.9 secs with an avg rate of 116 bpm. Isolated SVEs were occasional (3.4%, 45187), SVE Couplets were occasional (2.4%, 16459), and SVE Triplets were rare (<1.0%, 1104). Isolated VEs were rare (<1.0%, 2324), VE Couplets were rare (<1.0%, 20), and VE Triplets were rare (<1.0%, 2). Ventricular Bigeminy and Trigeminy were present.  1 run of wide-complex tachycardia (115 bpm) lasting 22 seconds (could VT or SVT with aberrancy) - also, numerous PSVT runs - over 1500 runs were recorded, lasting up to 38 seconds again with an average rate of 116 bpm.  Chrystie Nose, MD, [facility], FACP [place]  [facility] Medical Director of the [facility] Diplomate of the American Board of Clinical Lipidology Attending Cardiologist Direct Dial: [phone]  Fax: [phone] Website:  [email or web address]       ______________________________________________________________________________________________     Recent Labs: 06/19/2022: B Natriuretic Peptide 139.2 11/05/2022: ALT 12 11/06/2022: Magnesium 2.1 12/23/2022: BUN 23; Creat 1.19; Hemoglobin 12.8; Platelets 319; Potassium 4.9; Sodium 141; TSH 2.91  Recent Lipid Panel    Component Value Date/Time   CHOL 112 12/23/2022 1501   TRIG 204 (H) 12/23/2022 1501   HDL 52 12/23/2022 1501   CHOLHDL 2.2 12/23/2022 1501   VLDL 24 06/12/2013 0823   LDLCALC 33 12/23/2022 1501    History of Present Illness    83 year old female with the above past medical history including paroxysmal atrial tachycardia, mild aortic stenosis, hypertension, hyperlipidemia, 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   She was hospitalized in May 2024 in the setting of acute gallstone pancreatitis.  She underwent ERCP followed by laparoscopic cholecystectomy.  Postoperatively she developed hypoxia due to fluid volume overload with  atelectasis.  It was felt to be due to excessive fluid resuscitation in the setting of acute gallstone pancreatitis.  She had atrial tachycardia was started on metoprolol.  Echocardiogram showed EF 50 to 55%, normal LV function, no RWMA, mild concentric LVH, G1 DD, normal RV systolic function, mild biatrial enlargement, mild aortic valve stenosis, mean gradient 9 mmHg.  Outpatient cardiac monitor in 07/2022 revealed 1 run of wide-complex tachycardia lasting 22 seconds (VT versus SVT with aberrancy), numerous runs of PSVT.  Metoprolol was increased.  She was last seen in the office on 08/18/2022 and was doing well.  She denied palpitations.  She was hospitalized in 10/2022 in the setting of  mechanical fall with acute closed intertrochanteric fracture of the proximal left femur.  She underwent an intramedullary nailing of the left femur.    She presents today for follow-up.  Since her last visit she has done well from a cardiac standpoint.  She has recovered well from her hip surgery.  She denies any chest pain, palpitations, dizziness, dyspnea, edema, PND, orthopnea, weight gain.  Overall, she reports feeling well.  Home Medications    Current Outpatient Medications  Medication Sig Dispense Refill   albuterol (VENTOLIN HFA) 108 (90 Base) MCG/ACT inhaler Inhale 1-2 puffs into the lungs every 6 (six) hours as needed for wheezing or shortness of breath.     citalopram (CELEXA) 20 MG tablet Take 1 tablet (20 mg total) by mouth at bedtime. DX F41.9 90 tablet 3   docusate sodium (COLACE) 100 MG capsule Take 1 capsule (100 mg total) by mouth 2 (two) times daily. 10 capsule 0   donepezil (ARICEPT) 10 MG tablet TAKE 1 TABLET BY MOUTH EVERYDAY AT BEDTIME 90 tablet 3   ELIQUIS 5 MG TABS tablet TAKE 1 TABLET BY MOUTH TWICE A DAY 60 tablet 0   ferrous sulfate 325 (65 FE) MG EC tablet TAKE 1 TABLET BY MOUTH EVERY DAY WITH BREAKFAST 90 tablet 3   fluticasone (FLONASE) 50 MCG/ACT nasal spray SPRAY 1 SPRAY INTO EACH NOSTRIL  DAILY 48 mL 2   ipratropium (ATROVENT) 0.03 % nasal spray SPRAY 2 SPRAYS INTO EACH NOSTRIL 3 TIMES A DAY AS NEEDED FOR RHINITIS 90 mL 1   levothyroxine (SYNTHROID) 88 MCG tablet TAKE 1 TABLET BY MOUTH EVERY DAY IN THE MORNING 90 tablet 3   losartan (COZAAR) 100 MG tablet TAKE 1 TABLET BY MOUTH EVERY DAY 90 tablet 1   Melatonin 10 MG TABS Take 10 mg by mouth at bedtime.     metFORMIN (GLUCOPHAGE) 500 MG tablet Take 0.5 tablets (250 mg total) by mouth daily with breakfast.     montelukast (SINGULAIR) 10 MG tablet TAKE 1 TABLET BY MOUTH EVERYDAY AT BEDTIME 90 tablet 1   Multiple Vitamin (MULTIVITAMIN WITH MINERALS) TABS tablet Take 1 tablet by mouth daily with breakfast.     pantoprazole (PROTONIX) 40 MG tablet Take 1 tablet (40 mg total) by mouth at bedtime. 90 tablet 3   rOPINIRole (REQUIP) 1 MG tablet Take 1 tablet (1 mg total) by mouth 2 (two) times daily. 180 tablet 1   tacrolimus (PROGRAF) 1 MG capsule Take 1 mg by mouth in the morning and at bedtime.     traMADol (ULTRAM) 50 MG tablet TAKE 1 TABLET BY MOUTH EVERY 12 HOURS AS NEEDED. 30 tablet 1   TRELEGY ELLIPTA 100-62.5-25 MCG/ACT AEPB INHALE 1 PUFF BY MOUTH EVERY DAY 60 each 11   TYLENOL 325 MG CAPS Take 325-650 mg by mouth every 6 (six) hours as needed (for pain or headaches).     benzonatate (TESSALON) 200 MG capsule Take 1 capsule (200 mg total) by mouth 3 (three) times daily as needed for cough. (Patient not taking: Reported on 04/15/2023) 30 capsule 0   guaiFENesin (MUCINEX) 600 MG 12 hr tablet Take 1,200 mg by mouth as needed for to loosen phlegm.     metoprolol tartrate (LOPRESSOR) 25 MG tablet Take 1 tablet (25 mg total) by mouth 2 (two) times daily. 30 tablet 0   rosuvastatin (CRESTOR) 20 MG tablet Take 1 tablet (20 mg total) by mouth daily. 90 tablet 3   Current Facility-Administered Medications  Medication Dose Route Frequency  Provider Last Rate Last Admin   denosumab (PROLIA) injection 60 mg  60 mg Subcutaneous Once [name], NP         Review of Systems    She denies chest pain, palpitations, dyspnea, pnd, orthopnea, n, v, dizziness, syncope, edema, weight gain, or early satiety. All other systems reviewed and are otherwise negative except as noted above.   Physical Exam    VS:  BP 126/78 (BP Location: Left Arm, Patient Position: Sitting, Cuff Size: Normal)   Ht 5\' 6"  (1.676 m)   Wt 218 lb (98.9 kg)   BMI 35.19 kg/m  GEN: Well nourished, well developed, in no acute distress. HEENT: normal. Neck: Supple, no JVD, carotid bruits, or masses. Cardiac: RRR, 2/6 murmur, no rubs, or gallops. No clubbing, cyanosis, edema.  Radials/DP/PT 2+ and equal bilaterally.  Respiratory:  Respirations regular and unlabored, clear to auscultation bilaterally. GI: Soft, nontender, nondistended, BS + x 4. MS: no deformity or atrophy. Skin: warm and dry, no rash. Neuro:  Strength and sensation are intact. Psych: Normal affect.  Accessory Clinical Findings    ECG personally reviewed by me today -    - no EKG in office today.    Lab Results  Component Value Date   WBC 10.0 12/23/2022   HGB 12.8 12/23/2022   HCT 40.6 12/23/2022   MCV 94.0 12/23/2022   PLT 319 12/23/2022   Lab Results  Component Value Date   CREATININE 1.19 (H) 12/23/2022   BUN 23 12/23/2022   NA 141 12/23/2022   K 4.9 12/23/2022   CL 103 12/23/2022   CO2 28 12/23/2022   Lab Results  Component Value Date   ALT 12 11/05/2022   AST 24 11/05/2022   ALKPHOS 34 (L) 11/05/2022   BILITOT 0.6 11/05/2022   Lab Results  Component Value Date   CHOL 112 12/23/2022   HDL 52 12/23/2022   LDLCALC 33 12/23/2022   TRIG 204 (H) 12/23/2022   CHOLHDL 2.2 12/23/2022    Lab Results  Component Value Date   HGBA1C 5.2 12/23/2022    Assessment & Plan    1. PSVT/PAT: Outpatient cardiac monitor in 07/2022 revealed 1 run of wide-complex tachycardia lasting 22 seconds (VT versus SVT with aberrancy), numerous runs of PSVT.  She denies any recent  palpitations. Continue metoprolol.  2. Mild aortic stenosis: Echocardiogram in 06/2022 showed EF 50 to 55%, normal LV function, no RWMA, mild concentric LVH, G1 DD, normal RV systolic function, mild biatrial enlargement, mild aortic valve stenosis, mean gradient 9 mmHg.  Asymptomatic.  Will plan for repeat echo in 06/2022 for continued monitoring of aortic stenosis.  3. Hypertension: BP well controlled. Continue current antihypertensive regimen.   4. Hyperlipidemia: LDL was 33 in 12/2022. Continue Crestor.  5. History of PE: On chronic anticoagulation with Eliquis.  6. COPD: Denies dyspnea.  Follows with pulmonology.  7. CKD: Creatinine was stable at 1.19 in 12/2022.  Follows with nephrology.  8. OSA: Intolerant to CPAP.  9. Disposition: Follow-up in 1 year.       Joylene Grapes, NP 04/15/2023, 2:34 PM

## 2023-04-16 DIAGNOSIS — M80052D Age-related osteoporosis with current pathological fracture, left femur, subsequent encounter for fracture with routine healing: Secondary | ICD-10-CM | POA: Diagnosis not present

## 2023-04-16 DIAGNOSIS — D649 Anemia, unspecified: Secondary | ICD-10-CM | POA: Diagnosis not present

## 2023-04-16 DIAGNOSIS — I110 Hypertensive heart disease with heart failure: Secondary | ICD-10-CM | POA: Diagnosis not present

## 2023-04-16 DIAGNOSIS — E119 Type 2 diabetes mellitus without complications: Secondary | ICD-10-CM | POA: Diagnosis not present

## 2023-04-16 DIAGNOSIS — H9193 Unspecified hearing loss, bilateral: Secondary | ICD-10-CM | POA: Diagnosis not present

## 2023-04-16 DIAGNOSIS — J4489 Other specified chronic obstructive pulmonary disease: Secondary | ICD-10-CM | POA: Diagnosis not present

## 2023-04-16 DIAGNOSIS — G2581 Restless legs syndrome: Secondary | ICD-10-CM | POA: Diagnosis not present

## 2023-04-16 DIAGNOSIS — I509 Heart failure, unspecified: Secondary | ICD-10-CM | POA: Diagnosis not present

## 2023-04-16 DIAGNOSIS — F419 Anxiety disorder, unspecified: Secondary | ICD-10-CM | POA: Diagnosis not present

## 2023-04-22 DIAGNOSIS — I509 Heart failure, unspecified: Secondary | ICD-10-CM | POA: Diagnosis not present

## 2023-04-22 DIAGNOSIS — J4489 Other specified chronic obstructive pulmonary disease: Secondary | ICD-10-CM | POA: Diagnosis not present

## 2023-04-22 DIAGNOSIS — E119 Type 2 diabetes mellitus without complications: Secondary | ICD-10-CM | POA: Diagnosis not present

## 2023-04-22 DIAGNOSIS — H9193 Unspecified hearing loss, bilateral: Secondary | ICD-10-CM | POA: Diagnosis not present

## 2023-04-22 DIAGNOSIS — I110 Hypertensive heart disease with heart failure: Secondary | ICD-10-CM | POA: Diagnosis not present

## 2023-04-22 DIAGNOSIS — M80052D Age-related osteoporosis with current pathological fracture, left femur, subsequent encounter for fracture with routine healing: Secondary | ICD-10-CM | POA: Diagnosis not present

## 2023-04-22 DIAGNOSIS — F419 Anxiety disorder, unspecified: Secondary | ICD-10-CM | POA: Diagnosis not present

## 2023-04-22 DIAGNOSIS — D649 Anemia, unspecified: Secondary | ICD-10-CM | POA: Diagnosis not present

## 2023-04-22 DIAGNOSIS — G2581 Restless legs syndrome: Secondary | ICD-10-CM | POA: Diagnosis not present

## 2023-04-23 ENCOUNTER — Telehealth (INDEPENDENT_AMBULATORY_CARE_PROVIDER_SITE_OTHER): Payer: Self-pay | Admitting: Otolaryngology

## 2023-04-23 NOTE — Telephone Encounter (Signed)
 Confirmed appt & location [id number]

## 2023-04-26 ENCOUNTER — Encounter (INDEPENDENT_AMBULATORY_CARE_PROVIDER_SITE_OTHER): Payer: Self-pay

## 2023-04-26 ENCOUNTER — Ambulatory Visit (INDEPENDENT_AMBULATORY_CARE_PROVIDER_SITE_OTHER)

## 2023-04-26 ENCOUNTER — Ambulatory Visit (INDEPENDENT_AMBULATORY_CARE_PROVIDER_SITE_OTHER): Payer: Self-pay | Admitting: Audiology

## 2023-04-26 VITALS — BP 145/70 | HR 70 | Ht 66.0 in | Wt 212.0 lb

## 2023-04-26 DIAGNOSIS — H903 Sensorineural hearing loss, bilateral: Secondary | ICD-10-CM

## 2023-04-26 DIAGNOSIS — H6981 Other specified disorders of Eustachian tube, right ear: Secondary | ICD-10-CM

## 2023-04-26 DIAGNOSIS — H6123 Impacted cerumen, bilateral: Secondary | ICD-10-CM

## 2023-04-26 NOTE — Progress Notes (Signed)
  [address], [address] [place], [place] [address] [phone]  Hearing Aid Check     [name] comes for a scheduled appointment for a hearing aid check.    Accompanied [id number]   Right Left  Hearing aid manufacturer Resound [place] mini 5 R [id number] Resound [place] mini 5 R [id number]  Hearing aid style Receiver in the canal Receiver in the canal  Hearing aid battery rechargeable rechargeable  Receiver   Dome/ custom earpiece Large open Large open  Retention wire no no  Warranty expiration date 05-06-2024 05-06-2024  Loss and Damage unknown unknown  Initial fitting date 04-14-2021 04-14-2021  Device was fit at: [facility]    Chief complaint: Patient reports she cannot wear the hearing aids because of the feedback.  Actions taken: Inspection of the device and listening check showed that it was working well. Both were cleaned. How to change the wax filter was demonstrated. Both ear canals have a significant amount of wax and should be cleaned prior to considering making adjustments to the hearing aids. She is on schedule to see Dr. Suszanne Conners who can help with the ear cleaning.  Services fee: $0 was paid at checkout.     Recommend: Return for a hearing aid check , as needed. Return for a hearing evaluation and to see an ENT, if concerns with hearing changes arise.    [name] [name], AUD

## 2023-04-27 DIAGNOSIS — M80052D Age-related osteoporosis with current pathological fracture, left femur, subsequent encounter for fracture with routine healing: Secondary | ICD-10-CM | POA: Diagnosis not present

## 2023-04-27 DIAGNOSIS — H9193 Unspecified hearing loss, bilateral: Secondary | ICD-10-CM | POA: Diagnosis not present

## 2023-04-27 DIAGNOSIS — F419 Anxiety disorder, unspecified: Secondary | ICD-10-CM | POA: Diagnosis not present

## 2023-04-27 DIAGNOSIS — I509 Heart failure, unspecified: Secondary | ICD-10-CM | POA: Diagnosis not present

## 2023-04-27 DIAGNOSIS — I110 Hypertensive heart disease with heart failure: Secondary | ICD-10-CM | POA: Diagnosis not present

## 2023-04-27 DIAGNOSIS — G2581 Restless legs syndrome: Secondary | ICD-10-CM | POA: Diagnosis not present

## 2023-04-27 DIAGNOSIS — D649 Anemia, unspecified: Secondary | ICD-10-CM | POA: Diagnosis not present

## 2023-04-27 DIAGNOSIS — H6123 Impacted cerumen, bilateral: Secondary | ICD-10-CM | POA: Insufficient documentation

## 2023-04-27 DIAGNOSIS — E119 Type 2 diabetes mellitus without complications: Secondary | ICD-10-CM | POA: Diagnosis not present

## 2023-04-27 DIAGNOSIS — J4489 Other specified chronic obstructive pulmonary disease: Secondary | ICD-10-CM | POA: Diagnosis not present

## 2023-04-27 DIAGNOSIS — H903 Sensorineural hearing loss, bilateral: Secondary | ICD-10-CM | POA: Insufficient documentation

## 2023-04-27 DIAGNOSIS — H6981 Other specified disorders of Eustachian tube, right ear: Secondary | ICD-10-CM | POA: Insufficient documentation

## 2023-04-27 NOTE — Progress Notes (Signed)
 Patient ID: [name], female   DOB: [birth year], 83 y.o.   MRN: [id number]  Follow-up: Bilateral hearing loss  HPI: The patient is an 83 year old female who returns today for her follow-up evaluation.  The patient has a history of right ear mixed hearing loss.  She was previously noted to have right eustachian tube dysfunction and middle ear effusion.  She underwent right myringotomy and tube placement in March 2023.  The tube has since extruded.  She also underwent an MRI scan. The MRI showed small bilateral internal auditory canal enhancement, concerning for possible acoustic neuroma.  A follow-up MRI scan was negative for any retrocochlear lesion.  The patient returns today reporting no change in her hearing.  She denies any otorrhea or vertigo. No other ENT, GI, or respiratory issue noted since the last visit.   Exam: General: Communicates without difficulty, well nourished, no acute distress. Head: Normocephalic, no evidence injury, no tenderness, facial buttresses intact without stepoff. Face/sinus: No tenderness to palpation and percussion. Facial movement is normal and symmetric. Eyes: PERRL, EOMI. No scleral icterus, conjunctivae clear. Neuro: CN II exam reveals vision grossly intact.  No nystagmus at any point of gaze. Ears: Auricles well formed without lesions.  Bilateral cerumen impaction.  Nose: External evaluation reveals normal support and skin without lesions.  Dorsum is intact.  Anterior rhinoscopy reveals congested mucosa over anterior aspect of inferior turbinates and intact septum.  No purulence noted. Oral:  Oral cavity and oropharynx are intact, symmetric, without erythema or edema.  Mucosa is moist without lesions. Neck: Full range of motion without pain.  There is no significant lymphadenopathy.  No masses palpable.  Thyroid bed within normal limits to palpation.  Parotid glands and submandibular glands equal bilaterally without mass.  Trachea is midline. Neuro:  CN 2-12  grossly intact.   Procedure: Bilateral cerumen disimpaction Anesthesia: None Description: Under the operating microscope, the cerumen is carefully removed with a combination of cerumen currette, alligator forceps, and suction catheters.  After the cerumen is removed, the TMs are noted to be normal.  No mass, erythema, or lesions. The patient tolerated the procedure well.   Assessment: 1.  Bilateral cerumen impaction.  After the disimpaction procedure, both tympanic membranes and middle ear spaces are noted to be normal. 2.  Subjectively stable bilateral sensorineural hearing loss. 3.  Her right eustachian tube dysfunction has resolved. 4.  Her follow-up MRI scan was negative for any retrocochlear lesion.  Plan: 1.  Otomicroscopy with bilateral cerumen disimpaction. 2.  The physical exam findings are reviewed with the patient. 3.  The patient is reassured that her MRI scan is negative for any intracranial lesion. 4.  The patient will return for reevaluation in 1 year, sooner if needed.

## 2023-05-04 DIAGNOSIS — F419 Anxiety disorder, unspecified: Secondary | ICD-10-CM | POA: Diagnosis not present

## 2023-05-04 DIAGNOSIS — E119 Type 2 diabetes mellitus without complications: Secondary | ICD-10-CM | POA: Diagnosis not present

## 2023-05-04 DIAGNOSIS — I110 Hypertensive heart disease with heart failure: Secondary | ICD-10-CM | POA: Diagnosis not present

## 2023-05-04 DIAGNOSIS — G2581 Restless legs syndrome: Secondary | ICD-10-CM | POA: Diagnosis not present

## 2023-05-04 DIAGNOSIS — I509 Heart failure, unspecified: Secondary | ICD-10-CM | POA: Diagnosis not present

## 2023-05-04 DIAGNOSIS — D649 Anemia, unspecified: Secondary | ICD-10-CM | POA: Diagnosis not present

## 2023-05-04 DIAGNOSIS — H9193 Unspecified hearing loss, bilateral: Secondary | ICD-10-CM | POA: Diagnosis not present

## 2023-05-04 DIAGNOSIS — M80052D Age-related osteoporosis with current pathological fracture, left femur, subsequent encounter for fracture with routine healing: Secondary | ICD-10-CM | POA: Diagnosis not present

## 2023-05-04 DIAGNOSIS — J4489 Other specified chronic obstructive pulmonary disease: Secondary | ICD-10-CM | POA: Diagnosis not present

## 2023-05-14 DIAGNOSIS — J4489 Other specified chronic obstructive pulmonary disease: Secondary | ICD-10-CM | POA: Diagnosis not present

## 2023-05-14 DIAGNOSIS — H9193 Unspecified hearing loss, bilateral: Secondary | ICD-10-CM | POA: Diagnosis not present

## 2023-05-14 DIAGNOSIS — E119 Type 2 diabetes mellitus without complications: Secondary | ICD-10-CM | POA: Diagnosis not present

## 2023-05-14 DIAGNOSIS — F419 Anxiety disorder, unspecified: Secondary | ICD-10-CM | POA: Diagnosis not present

## 2023-05-14 DIAGNOSIS — D649 Anemia, unspecified: Secondary | ICD-10-CM | POA: Diagnosis not present

## 2023-05-14 DIAGNOSIS — M80052D Age-related osteoporosis with current pathological fracture, left femur, subsequent encounter for fracture with routine healing: Secondary | ICD-10-CM | POA: Diagnosis not present

## 2023-05-14 DIAGNOSIS — I509 Heart failure, unspecified: Secondary | ICD-10-CM | POA: Diagnosis not present

## 2023-05-14 DIAGNOSIS — G2581 Restless legs syndrome: Secondary | ICD-10-CM | POA: Diagnosis not present

## 2023-05-14 DIAGNOSIS — I110 Hypertensive heart disease with heart failure: Secondary | ICD-10-CM | POA: Diagnosis not present

## 2023-05-19 DIAGNOSIS — I509 Heart failure, unspecified: Secondary | ICD-10-CM | POA: Diagnosis not present

## 2023-05-19 DIAGNOSIS — G2581 Restless legs syndrome: Secondary | ICD-10-CM | POA: Diagnosis not present

## 2023-05-19 DIAGNOSIS — J4489 Other specified chronic obstructive pulmonary disease: Secondary | ICD-10-CM | POA: Diagnosis not present

## 2023-05-19 DIAGNOSIS — E119 Type 2 diabetes mellitus without complications: Secondary | ICD-10-CM | POA: Diagnosis not present

## 2023-05-19 DIAGNOSIS — F419 Anxiety disorder, unspecified: Secondary | ICD-10-CM | POA: Diagnosis not present

## 2023-05-19 DIAGNOSIS — M80052D Age-related osteoporosis with current pathological fracture, left femur, subsequent encounter for fracture with routine healing: Secondary | ICD-10-CM | POA: Diagnosis not present

## 2023-05-19 DIAGNOSIS — I110 Hypertensive heart disease with heart failure: Secondary | ICD-10-CM | POA: Diagnosis not present

## 2023-05-19 DIAGNOSIS — H9193 Unspecified hearing loss, bilateral: Secondary | ICD-10-CM | POA: Diagnosis not present

## 2023-05-19 DIAGNOSIS — D649 Anemia, unspecified: Secondary | ICD-10-CM | POA: Diagnosis not present

## 2023-05-25 ENCOUNTER — Telehealth: Payer: Self-pay | Admitting: *Deleted

## 2023-05-25 NOTE — Telephone Encounter (Signed)
 Received [employer] Verification for Prolia Copay $40, [place] Approved [facility] 02/10/2023-02/09/2024 Auth #: [id number]

## 2023-05-25 NOTE — Telephone Encounter (Signed)
 Called patient to schedule lab appointment before injection on 07/06/2023  [facility] to return call.

## 2023-05-26 DIAGNOSIS — D045 Carcinoma in situ of skin of trunk: Secondary | ICD-10-CM | POA: Diagnosis not present

## 2023-05-26 DIAGNOSIS — D0472 Carcinoma in situ of skin of left lower limb, including hip: Secondary | ICD-10-CM | POA: Diagnosis not present

## 2023-05-26 DIAGNOSIS — D485 Neoplasm of uncertain behavior of skin: Secondary | ICD-10-CM | POA: Diagnosis not present

## 2023-05-26 DIAGNOSIS — D0462 Carcinoma in situ of skin of left upper limb, including shoulder: Secondary | ICD-10-CM | POA: Diagnosis not present

## 2023-05-26 DIAGNOSIS — D0471 Carcinoma in situ of skin of right lower limb, including hip: Secondary | ICD-10-CM | POA: Diagnosis not present

## 2023-05-27 ENCOUNTER — Other Ambulatory Visit: Payer: Self-pay | Admitting: Nurse Practitioner

## 2023-05-27 DIAGNOSIS — N1832 Chronic kidney disease, stage 3b: Secondary | ICD-10-CM

## 2023-05-27 NOTE — Telephone Encounter (Signed)
 Copied from CRM [id number]. Topic: Clinical - Medication Refill >> May 27, 2023 12:36 PM Corin V wrote: Most Recent Primary Care Visit:  Provider: Ulyses Gandy E  Department: [facility]  Visit Type: MYCHART VIDEO VISIT  Date: 03/08/2023  Medication: ELIQUIS 5 MG TABS tablet  Has the patient contacted their pharmacy? Yes (Agent: If no, request that the patient contact the pharmacy for the refill. If patient does not wish to contact the pharmacy document the reason why and proceed with request.) (Agent: If yes, when and what did the pharmacy advise?)  Is this the correct pharmacy for this prescription? Yes If no, delete pharmacy and type the correct one.  This is the patient's preferred pharmacy:  [id number] - [place], [place] - [address] [place] [address] Phone: [phone] Fax: [phone]   Has the prescription been filled recently? No  Is the patient out of the medication? Yes- will run out today  Has the patient been seen for an appointment in the last year OR does the patient have an upcoming appointment? Yes  Can we respond through MyChart? No  Agent: Please be advised that Rx refills may take up to 3 business days. We ask that you follow-up with your pharmacy.

## 2023-06-01 ENCOUNTER — Ambulatory Visit: Payer: Medicare PPO | Admitting: Physician Assistant

## 2023-06-01 ENCOUNTER — Other Ambulatory Visit (INDEPENDENT_AMBULATORY_CARE_PROVIDER_SITE_OTHER)

## 2023-06-01 ENCOUNTER — Other Ambulatory Visit: Payer: Self-pay

## 2023-06-01 DIAGNOSIS — S72142A Displaced intertrochanteric fracture of left femur, initial encounter for closed fracture: Secondary | ICD-10-CM

## 2023-06-01 NOTE — Progress Notes (Signed)
 Post-Op Visit Note   Patient: [name]           Date of Birth: 27-Nov-1940           MRN: [id number] Visit Date: 06/01/2023 PCP: Verma Gobble, NP   Assessment & Plan:  Chief Complaint:  Chief Complaint  Patient presents with   Left Leg - Follow-up   Visit Diagnoses:  1. Closed comminuted intertrochanteric fracture of proximal femur, left, initial encounter [facility])     Plan: Patient is a pleasant 83 year old female who comes in today approximately 7 months status post left hip IM nail from intertrochanteric femur fracture, date of surgery 11/02/2022.  She has been feeling much better.  She has minimal pain.  She continues to have issues with getting multiple home health PT visits.  Currently ambulating with a rollator.  She was ambulating unassisted prior to her fall.  Examination of the left hip reveals painless hip flexion and logroll.  She is neurovascularly intact distally.  At this point, I would like to continue to extend home health physical therapy.  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  She will follow-up with us  in 2 months for repeat evaluation and x-rays of the left femur.  Call with concerns or questions.  Follow-Up Instructions: Return in about 2 months (around 08/01/2023).   Orders:  Orders Placed This Encounter  Procedures   XR FEMUR MIN 2 VIEWS LEFT   No orders of the defined types were placed in this encounter.   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  No hardware complication.   PMFS History: Patient Active Problem List   Diagnosis Date Noted   Sensorineural hearing loss, bilateral 04/27/2023   Impacted cerumen of both ears 04/27/2023   Other specified disorders of eustachian tube, right ear 04/27/2023   Closed comminuted intertrochanteric fracture of proximal femur,  left, initial encounter (HCC) 10/31/2022   Accidental fall 10/31/2022   Chronic anticoagulation 10/31/2022   History of pulmonary embolism 10/31/2022   History of PAT (paroxysmal atrial tachycardia) 10/31/2022   Closed left hip fracture, initial encounter (HCC) 10/31/2022   Acute pancreatitis 06/13/2022   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/26/2022   Acquired hypercoagulable state (HCC) 01/26/2022   Bilateral hearing loss 11/04/2020   Diabetes mellitus without complication (HCC) 11/04/2020   RLS (restless legs syndrome) 11/04/2020   OSA (obstructive sleep apnea) 08/29/2019   Abnormal findings on diagnostic imaging of lung 08/29/2019   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   Pulmonary embolism (HCC) 02/07/2019   GERD without esophagitis 05/15/2017   Dyslipidemia 05/15/2017   Anxiety 05/15/2017   Constipation due to opioid therapy 05/15/2017   Accidental fall from chair, initial encounter 05/08/2017   Closed posttraumatic compression fracture of lumbar vertebra (HCC) 05/08/2017   Leukocytosis 05/08/2017   S/P right TKA 07/10/2014   S/P knee replacement 07/10/2014   COPD (chronic obstructive pulmonary disease) (HCC) 09/24/2013   Hypothyroidism 07/24/2012   Essential hypertension, benign 07/24/2012   Allergic rhinitis 07/24/2012   Mixed hyperlipidemia 07/24/2012   Past Medical History:  Diagnosis Date   Actinic keratosis    Arthritis    Asthma    Back injury 2019   Fracture   Basal cell carcinoma    COPD (chronic obstructive pulmonary disease) (HCC)    GERD (gastroesophageal reflux disease)    H/O  blood clots 2020   Lung, Per [facility]   H/O mammogram 2022   Per [facility]   Heart murmur    hx of in childhood    High cholesterol    Hyperlipidemia    Hypertension    Hypothyroidism    Kidney disease    Per [facility]   MCNS (minimal change nephrotic syndrome)    OSA (obstructive sleep apnea) 08/29/2019   Osteopenia     Shortness of breath dyspnea    on exertion    Squamous cell carcinoma    Thyroid  disease    Per [facility]    Family History  Problem Relation Age of Onset   COPD Mother        Husband Smoked    Lymphoma Father    Hypertension Sister    Scoliosis Daughter    Stroke Maternal Grandfather    Rheumatic fever Paternal Grandmother    Heart disease Paternal Grandfather     Past Surgical History:  Procedure Laterality Date   ABDOMINAL HYSTERECTOMY     APPENDECTOMY     BILATERAL SALPINGOOPHORECTOMY     Ovarian Cysts    CHOLECYSTECTOMY N/A 06/17/2022   Procedure: LAPAROSCOPIC CHOLECYSTECTOMY;  Surgeon: Lujean Sake, MD;  Location: [facility];  Service: General;  Laterality: N/A;   COLONOSCOPY  2001   Dr.Mann, Per [facility]   CONVERSION TO TOTAL KNEE Right 07/10/2014   Procedure: RIGHT CONVERSION OF PARTIAL KNEE TO TOTAL KNEE;  Surgeon: Claiborne Crew, MD;  Location: [facility];  Service: Orthopedics;  Laterality: Right;   ENDOSCOPIC RETROGRADE CHOLANGIOPANCREATOGRAPHY (ERCP) WITH PROPOFOL  N/A 06/15/2022   Procedure: ENDOSCOPIC RETROGRADE CHOLANGIOPANCREATOGRAPHY (ERCP) WITH PROPOFOL ;  Surgeon: Ozell Blunt, MD;  Location: [facility];  Service: Gastroenterology;  Laterality: N/A;   INTRAMEDULLARY (IM) NAIL INTERTROCHANTERIC Left 11/02/2022   Procedure: INTRAMEDULLARY NAILING OF LEFT FEMUR;  Surgeon: Wes Hamman, MD;  Location: [facility];  Service: Orthopedics;  Laterality: Left;   IR ANGIOGRAM PULMONARY BILATERAL SELECTIVE  02/08/2019   IR ANGIOGRAM SELECTIVE EACH ADDITIONAL VESSEL  02/08/2019   IR ANGIOGRAM SELECTIVE EACH ADDITIONAL VESSEL  02/08/2019   IR INFUSION THROMBOL ARTERIAL INITIAL (MS)  02/08/2019   IR INFUSION THROMBOL ARTERIAL INITIAL (MS)  02/08/2019   IR THROMB F/U EVAL ART/VEN FINAL DAY (MS)  02/09/2019   IR US  GUIDE VASC ACCESS RIGHT  02/08/2019   MEDIAL PARTIAL KNEE REPLACEMENT Bilateral    MOHS SURGERY     x 2   REMOVAL OF STONES  06/15/2022    Procedure: REMOVAL OF STONES;  Surgeon: Ozell Blunt, MD;  Location: [facility];  Service: Gastroenterology;;   RENAL BIOPSY     x 2   REPLACEMENT TOTAL KNEE  2012   Per [facility]   ROTATOR CUFF REPAIR Left 2008   SPHINCTEROTOMY  06/15/2022   Procedure: SPHINCTEROTOMY;  Surgeon: Ozell Blunt, MD;  Location: [facility];  Service: Gastroenterology;;   Social History   Occupational History   Occupation: TEACHER     Employer: [employer]    Comment: RETIRED   Tobacco Use   Smoking status: Former    Current packs/day: 0.00    Average packs/day: 1.5 packs/day for 25.0 years (37.5 ttl pk-yrs)    Types: Cigarettes    Start date: 01/13/1948    Quit date: 01/12/1973    Years since quitting: 50.4   Smokeless tobacco: Never  Vaping Use   Vaping  status: Never Used  Substance and Sexual Activity   Alcohol  use: Yes    Alcohol /week: 0.5 standard drinks of alcohol     Types: 1 Standard drinks or equivalent per week    Comment: She occasionally drinks a glass of wine.     Drug use: No   Sexual activity: Not Currently    Partners: Male

## 2023-06-03 NOTE — Telephone Encounter (Signed)
 Called and spoke with patient.  Scheduled lab appointment for 06/17/2023 due to Prolia  Injection.   Please place orders.

## 2023-06-04 ENCOUNTER — Other Ambulatory Visit: Payer: Self-pay | Admitting: Nurse Practitioner

## 2023-06-04 DIAGNOSIS — E1122 Type 2 diabetes mellitus with diabetic chronic kidney disease: Secondary | ICD-10-CM

## 2023-06-04 DIAGNOSIS — D508 Other iron deficiency anemias: Secondary | ICD-10-CM

## 2023-06-04 DIAGNOSIS — M810 Age-related osteoporosis without current pathological fracture: Secondary | ICD-10-CM

## 2023-06-04 NOTE — Telephone Encounter (Signed)
 Ordered placed

## 2023-06-07 ENCOUNTER — Telehealth: Payer: Self-pay | Admitting: *Deleted

## 2023-06-07 ENCOUNTER — Encounter: Payer: Self-pay | Admitting: Nurse Practitioner

## 2023-06-07 ENCOUNTER — Ambulatory Visit (INDEPENDENT_AMBULATORY_CARE_PROVIDER_SITE_OTHER): Payer: Medicare PPO | Admitting: Nurse Practitioner

## 2023-06-07 DIAGNOSIS — Z0000 Encounter for general adult medical examination without abnormal findings: Secondary | ICD-10-CM | POA: Diagnosis not present

## 2023-06-07 DIAGNOSIS — E2839 Other primary ovarian failure: Secondary | ICD-10-CM

## 2023-06-07 DIAGNOSIS — S7292XK Unspecified fracture of left femur, subsequent encounter for closed fracture with nonunion: Secondary | ICD-10-CM | POA: Diagnosis not present

## 2023-06-07 NOTE — Telephone Encounter (Signed)
 Ms. camelle, staudacher are scheduled for a virtual visit with your provider today.    Just as we do with appointments in the office, we must obtain your consent to participate.  Your consent will be active for this visit and any virtual visit you Christien Frankl have with one of our providers in the next 365 days.    If you have a MyChart account, I can also send a copy of this consent to you electronically.  All virtual visits are billed to your insurance company just like a traditional visit in the office.  As this is a virtual visit, video technology does not allow for your provider to perform a traditional examination.  This Graylin Sperling limit your provider's ability to fully assess your condition.  If your provider identifies any concerns that need to be evaluated in person or the need to arrange testing such as labs, EKG, etc, we will make arrangements to do so.    Although advances in technology are sophisticated, we cannot ensure that it will always work on either your end or our end.  If the connection with a video visit is poor, we Anne-Marie Genson have to switch to a telephone visit.  With either a video or telephone visit, we are not always able to ensure that we have a secure connection.   I need to obtain your verbal consent now.   Are you willing to proceed with your visit today?   Dorinda Ayoub has provided verbal consent on 06/07/2023 for a virtual visit (video or telephone).   MayBethena Brothers, [place] 06/07/2023  2:59 PM

## 2023-06-07 NOTE — Progress Notes (Signed)
 Subjective:   [name] is a 83 y.o. female who presents for Medicare Annual (Subsequent) preventive examination.  Visit Complete: Virtual I connected with  Gabino Joe on 06/07/23 by a video and audio enabled telemedicine application and verified that I am speaking with the correct person using two identifiers.  Patient Location: Home  Provider Location: Office/Clinic  I discussed the limitations of evaluation and management by telemedicine. The patient expressed understanding and agreed to proceed.  Vital Signs: Because this visit was a virtual/telehealth visit, some criteria may be missing or patient reported. Any vitals not documented were not able to be obtained and vitals that have been documented are patient reported.  Cardiac Risk Factors include: advanced age (>13men, >14 women)     Objective:    There were no vitals filed for this visit. There is no height or weight on file to calculate BMI.     06/07/2023    2:54 PM 03/08/2023    1:27 PM 02/15/2023   12:55 PM 10/31/2022    2:16 PM 07/31/2022    2:39 PM 06/13/2022    6:23 PM 06/04/2022   11:45 AM  Advanced Directives  Does Patient Have a Medical Advance Directive? Yes Yes Yes No Yes Yes Yes  Type of Estate agent of [place];Living will Healthcare Power of [place];Living will Healthcare Power of [place];Living will  Healthcare Power of [place];Living will Living will Healthcare Power of [place];Living will  Does patient want to make changes to medical advance directive? No - Patient declined No - Patient declined No - Patient declined  No - Patient declined No - Guardian declined No - Patient declined  Copy of Healthcare Power of Attorney in Chart? No - copy requested No - copy requested No - copy requested  No - copy requested No - copy requested No - copy requested    Current Medications (verified) Outpatient Encounter Medications as of 06/07/2023  Medication Sig    albuterol  (VENTOLIN  HFA) 108 (90 Base) MCG/ACT inhaler Inhale 1-2 puffs into the lungs every 6 (six) hours as needed for wheezing or shortness of breath.   apixaban  (ELIQUIS ) 5 MG TABS tablet TAKE 1 TABLET BY MOUTH TWICE A DAY   citalopram  (CELEXA ) 20 MG tablet Take 1 tablet (20 mg total) by mouth at bedtime. DX F41.9   docusate sodium  (COLACE) 100 MG capsule Take 1 capsule (100 mg total) by mouth 2 (two) times daily.   donepezil  (ARICEPT ) 10 MG tablet TAKE 1 TABLET BY MOUTH EVERYDAY AT BEDTIME   ferrous sulfate  325 (65 FE) MG EC tablet TAKE 1 TABLET BY MOUTH EVERY DAY WITH BREAKFAST   fluticasone  (FLONASE ) 50 MCG/ACT nasal spray SPRAY 1 SPRAY INTO EACH NOSTRIL DAILY   ipratropium (ATROVENT ) 0.03 % nasal spray SPRAY 2 SPRAYS INTO EACH NOSTRIL 3 TIMES A DAY AS NEEDED FOR RHINITIS   levothyroxine  (SYNTHROID ) 88 MCG tablet TAKE 1 TABLET BY MOUTH EVERY DAY IN THE MORNING   losartan  (COZAAR ) 100 MG tablet TAKE 1 TABLET BY MOUTH EVERY DAY   Melatonin 10 MG TABS Take 10 mg by mouth at bedtime.   metFORMIN  (GLUCOPHAGE ) 500 MG tablet Take 0.5 tablets (250 mg total) by mouth daily with supper.   montelukast  (SINGULAIR ) 10 MG tablet TAKE 1 TABLET BY MOUTH EVERYDAY AT BEDTIME   Multiple Vitamin (MULTIVITAMIN WITH MINERALS) TABS tablet Take 1 tablet by mouth daily with breakfast.   pantoprazole  (PROTONIX ) 40 MG tablet Take 1 tablet (40 mg total) by mouth at  bedtime.   rOPINIRole  (REQUIP ) 1 MG tablet Take 1 tablet (1 mg total) by mouth 2 (two) times daily.   tacrolimus  (PROGRAF ) 1 MG capsule Take 1 mg by mouth in the morning and at bedtime.   traMADol  (ULTRAM ) 50 MG tablet TAKE 1 TABLET BY MOUTH EVERY 12 HOURS AS NEEDED.   TRELEGY ELLIPTA  100-62.5-25 MCG/ACT AEPB INHALE 1 PUFF BY MOUTH EVERY DAY   TYLENOL  325 MG CAPS Take 325-650 mg by mouth every 6 (six) hours as needed (for pain or headaches).   benzonatate  (TESSALON ) 200 MG capsule Take 1 capsule (200 mg total) by mouth 3 (three) times daily as needed for  cough. (Patient not taking: Reported on 06/07/2023)   metoprolol  tartrate (LOPRESSOR ) 25 MG tablet Take 1 tablet (25 mg total) by mouth 2 (two) times daily.   rosuvastatin  (CRESTOR ) 20 MG tablet Take 1 tablet (20 mg total) by mouth daily.   [DISCONTINUED] guaiFENesin  (MUCINEX ) 600 MG 12 hr tablet Take 1,200 mg by mouth as needed for to loosen phlegm.   Facility-Administered Encounter Medications as of 06/07/2023  Medication   denosumab  (PROLIA ) injection 60 mg    Allergies (verified) Patient has no known allergies.   History: Past Medical History:  Diagnosis Date   Actinic keratosis    Arthritis    Asthma    Back injury 2019   Fracture   Basal cell carcinoma    COPD (chronic obstructive pulmonary disease) (HCC)    GERD (gastroesophageal reflux disease)    H/O blood clots 2020   Lung, Per [facility] New Patient Packet   H/O mammogram 2022   Per [facility] New Patient Packet   Heart murmur    hx of in childhood    High cholesterol    Hyperlipidemia    Hypertension    Hypothyroidism    Kidney disease    Per [facility] New Patient Packet   MCNS (minimal change nephrotic syndrome)    OSA (obstructive sleep apnea) 08/29/2019   Osteopenia    Shortness of breath dyspnea    on exertion    Squamous cell carcinoma    Thyroid  disease    Per [facility] New Patient Packet   Past Surgical History:  Procedure Laterality Date   ABDOMINAL HYSTERECTOMY     APPENDECTOMY     BILATERAL SALPINGOOPHORECTOMY     Ovarian Cysts    CHOLECYSTECTOMY N/A 06/17/2022   Procedure: LAPAROSCOPIC CHOLECYSTECTOMY;  Surgeon: Lujean Sake, MD;  Location: [facility];  Service: General;  Laterality: N/A;   COLONOSCOPY  2001   Dr.Mann, Per [facility] New Patient Packet   CONVERSION TO TOTAL KNEE Right 07/10/2014   Procedure: RIGHT CONVERSION OF PARTIAL KNEE TO TOTAL KNEE;  Surgeon: Claiborne Crew, MD;  Location: [facility];  Service: Orthopedics;  Laterality: Right;   ENDOSCOPIC RETROGRADE CHOLANGIOPANCREATOGRAPHY (ERCP) WITH PROPOFOL  N/A 06/15/2022    Procedure: ENDOSCOPIC RETROGRADE CHOLANGIOPANCREATOGRAPHY (ERCP) WITH PROPOFOL ;  Surgeon: Ozell Blunt, MD;  Location: [facility];  Service: Gastroenterology;  Laterality: N/A;   INTRAMEDULLARY (IM) NAIL INTERTROCHANTERIC Left 11/02/2022   Procedure: INTRAMEDULLARY NAILING OF LEFT FEMUR;  Surgeon: Wes Hamman, MD;  Location: [facility] OR;  Service: Orthopedics;  Laterality: Left;   IR ANGIOGRAM PULMONARY BILATERAL SELECTIVE  02/08/2019   IR ANGIOGRAM SELECTIVE EACH ADDITIONAL VESSEL  02/08/2019   IR ANGIOGRAM SELECTIVE EACH ADDITIONAL VESSEL  02/08/2019   IR INFUSION THROMBOL ARTERIAL INITIAL (MS)  02/08/2019   IR INFUSION THROMBOL ARTERIAL INITIAL (MS)  02/08/2019   IR THROMB F/U EVAL ART/VEN FINAL  DAY (MS)  02/09/2019   IR US  GUIDE VASC ACCESS RIGHT  02/08/2019   MEDIAL PARTIAL KNEE REPLACEMENT Bilateral    MOHS SURGERY     x 2   REMOVAL OF STONES  06/15/2022   Procedure: REMOVAL OF STONES;  Surgeon: Ozell Blunt, MD;  Location: [facility];  Service: Gastroenterology;;   RENAL BIOPSY     x 2   REPLACEMENT TOTAL KNEE  2012   Per [facility] New Patient Packet   ROTATOR CUFF REPAIR Left 2008   SPHINCTEROTOMY  06/15/2022   Procedure: SPHINCTEROTOMY;  Surgeon: Ozell Blunt, MD;  Location: [facility];  Service: Gastroenterology;;   Family History  Problem Relation Age of Onset   COPD Mother        Husband Smoked    Lymphoma Father    Hypertension Sister    Scoliosis Daughter    Stroke Maternal Grandfather    Rheumatic fever Paternal Grandmother    Heart disease Paternal Grandfather    Social History   Socioeconomic History   Marital status: Widowed    Spouse name: Not on file   Number of children: 2   Years of education: 16   Highest education level: Not on file  Occupational History   Occupation: TEACHER     Employer: [employer]    Comment: RETIRED   Tobacco Use   Smoking status: Former    Current packs/day: 0.00    Average packs/day: 1.5 packs/day for 25.0 years (37.5  ttl pk-yrs)    Types: Cigarettes    Start date: 01/13/1948    Quit date: 01/12/1973    Years since quitting: 50.4   Smokeless tobacco: Never  Vaping Use   Vaping status: Never Used  Substance and Sexual Activity   Alcohol  use: Yes    Alcohol /week: 0.5 standard drinks of alcohol     Types: 1 Standard drinks or equivalent per week    Comment: She occasionally drinks a glass of wine.     Drug use: No   Sexual activity: Not Currently    Partners: Male  Other Topics Concern   Not on file  Social History Narrative   Marital Status:  Widowed   Siblings: Kitty Simmons/ Rachel RuthChildren:  2;  Grandchildren:  5 Pets: NoneLiving Situation: Lives aloneOccupation: Retired Public affairs consultant: Engineer, maintenance (IT) Tobacco Use/Exposure:  She quit smoking > 20 years ago after having smoked 1 1/2 ppd for over 20 years.  Alcohol  Use:  Occasional (Wine) Drug Use:  NoneDiet:  RegularExercise:  Water Aerobic Class 3 days a week and WeightsHobbies: Reading/ Crafts/ Biking/ Reading]      Per [facility] New patient packet abstracted on 11/01/2020   Diet: Left blank      Caffeine: Yes      Married, if yes what year: Widow, married in 1961      Do you live in a house, apartment, assisted living, condo, trailer, ect: Patient did not answer correctly (lived alone until 10/1/20222)      Is it one or more stories: 2      How many persons live in your home? 2      Pets: cat      Highest level or education completed: 1 year of college       Current/Past profession: Geologist, engineering       Exercise:      No            Type and how often:  Living Will: Yes   DNR: Yes   POA/HPOA: Yes      Functional Status:   Do you have difficulty bathing or dressing yourself?  Left blank   Do you have difficulty preparing food or eating?Left blank   Do you have difficulty managing your medications?Left blank   Do you have difficulty managing your finances?Left blank   Do you have difficulty affording your  medications?Left blank   Social Drivers of Health   Financial Resource Strain: Not on file  Food Insecurity: No Food Insecurity (06/07/2023)   Hunger Vital Sign    Worried About Running Out of Food in the Last Year: Never true    Ran Out of Food in the Last Year: Never true  Transportation Needs: No Transportation Needs (06/07/2023)   PRAPARE - Administrator, Civil Service (Medical): No    Lack of Transportation (Non-Medical): No  Physical Activity: Not on file  Stress: Not on file  Social Connections: Not on file    Tobacco Counseling Counseling given: Not Answered   Clinical Intake:  Pre-visit preparation completed: Yes  Pain : No/denies pain     BMI - recorded: 34 Nutritional Status: BMI > 30  Obese Diabetes: Yes  How often do you need to have someone help you when you read instructions, pamphlets, or other written materials from your doctor or pharmacy?: 1 - Never         Activities of Daily Living    06/07/2023    2:54 PM 06/13/2022    6:22 PM  In your present state of health, do you have any difficulty performing the following activities:  Hearing? 0 1  Vision? 0 0  Difficulty concentrating or making decisions? 0 0  Walking or climbing stairs? 1 0  Dressing or bathing? 0 0  Doing errands, shopping? 1 0  Preparing Food and eating ? N   Using the Toilet? N   In the past six months, have you accidently leaked urine? Y   Do you have problems with loss of bowel control? N   Managing your Medications? N   Managing your Finances? Y   Housekeeping or managing your Housekeeping? Y     Patient Care Team: Verma Gobble, NP as PCP - General (Geriatric Medicine) [name], [name], MD as PCP - Cardiology (Cardiology) Claiborne Crew, MD as Consulting Physician (Orthopedic Surgery) Thais Fill, MD as Consulting Physician (Dermatology) Geraldyne Kluver, MD as Referring Physician (Nephrology) Wilder Handy, MD (Inactive) as Consulting Physician (Pulmonary  Disease) [facility])  Indicate any recent Medical Services you may have received from other than [facility] providers in the past year (date may be approximate).     Assessment:   This is a routine wellness examination for Jaylenne.  Hearing/Vision screen Vision Screening - Comments:: Dr. Alto Atta Last Eye Exam: 07/2022   Goals Addressed             This Visit's Progress    Patient Stated   On track    To stay as healthy as she is.        Depression Screen    06/07/2023    2:53 PM 02/15/2023   12:55 PM 12/23/2022    2:29 PM 06/22/2022    2:35 PM 06/04/2022   11:46 AM 01/26/2022   10:54 AM 04/08/2021    3:59 PM  PHQ 2/9 Scores  PHQ - 2 Score 0 0 0 0 0 0 0  PHQ- 9 Score  0       Fall Risk    06/07/2023    2:53 PM 03/08/2023    1:26 PM 02/15/2023   12:55 PM 12/23/2022    2:29 PM 07/31/2022    2:38 PM  Fall Risk   Falls in the past year? 0 0 0 0 1  Number falls in past yr: 0 0 0 0 1  Injury with Fall? 0 0 0 0 1  Risk for fall due to : No Fall Risks No Fall Risks  No Fall Risks No Fall Risks  Follow up Falls evaluation completed Falls evaluation completed;Education provided;Falls prevention discussed  Falls evaluation completed Falls evaluation completed    MEDICARE RISK AT HOME: Medicare Risk at Home Any stairs in or around the home?: Yes If so, are there any without handrails?: No Home free of loose throw rugs in walkways, pet beds, electrical cords, etc?: Yes Adequate lighting in your home to reduce risk of falls?: Yes Life alert?: Yes Use of a cane, walker or w/c?: Yes Grab bars in the bathroom?: Yes Shower chair or bench in shower?: Yes Elevated toilet seat or a handicapped toilet?: Yes  TIMED UP AND GO:  Was the test performed?  No    Cognitive Function:    01/13/2021   11:23 AM  MMSE - Mini Mental State Exam  Orientation to time 4  Orientation to time comments said the 4th  Orientation to Place 5  Registration 3  Attention/ Calculation 5   Recall 2  Recall-comments missed penny  Language- name 2 objects 2  Language- repeat 1  Language- follow 3 step command 3  Language- read & follow direction 0  Language-read & follow direction-comments did not read aloud  Write a sentence 1  Copy design 1  Total score 27        06/07/2023    2:56 PM 06/04/2022   11:46 AM  6CIT Screen  What Year? 0 points 0 points  What month? 0 points 0 points  What time? 0 points 0 points  Count back from 20 0 points 0 points  Months in reverse 0 points 0 points  Repeat phrase 0 points 2 points  Total Score 0 points 2 points    Immunizations Immunization History  Administered Date(s) Administered   Fluad Quad(high Dose 65+) 09/13/2018, 11/04/2020, 11/28/2021, 11/11/2022   Fluzone Influenza virus vaccine,trivalent (IIV3), split virus 10/09/2016   Influenza, High Dose Seasonal PF 10/06/2018, 11/07/2019   Influenza,inj,Quad PF,6+ Mos 10/09/2016, 10/27/2017   Influenza-Unspecified 11/09/2013, 10/08/2014, 11/10/2015, 10/09/2016   PFIZER(Purple Top)SARS-COV-2 Vaccination 04/09/2019, 05/09/2019, 12/26/2019   Pneumococcal Conjugate-13 12/26/2014, 04/21/2017   Pneumococcal Polysaccharide-23 02/10/2011   Tdap 12/09/2010   Zoster Recombinant(Shingrix) 04/21/2017, 07/02/2017   Zoster, Live 02/16/2008    TDAP status: Up to date  Flu Vaccine status: Up to date  Pneumococcal vaccine status: Up to date  Covid-19 vaccine status: Information provided on how to obtain vaccines.   Qualifies for Shingles Vaccine? Yes   Zostavax completed No   Shingrix Completed?: Yes  Screening Tests Health Maintenance  Topic Date Due   DTaP/Tdap/Td (2 - Td or Tdap) 12/08/2020   FOOT EXAM  03/10/2022   COVID-19 Vaccine (4 - 2024-25 season) 10/11/2022   HEMOGLOBIN A1C  06/22/2023   Diabetic kidney evaluation - Urine ACR  07/31/2023   OPHTHALMOLOGY EXAM  08/25/2023   INFLUENZA VACCINE  09/10/2023   Diabetic kidney evaluation - eGFR measurement  12/23/2023    Medicare Annual Wellness (AWV)  06/06/2024  Pneumonia Vaccine 18+ Years old  Completed   DEXA SCAN  Completed   Zoster Vaccines- Shingrix  Completed   HPV VACCINES  Aged Out   Meningococcal B Vaccine  Aged Out    Health Maintenance  Health Maintenance Due  Topic Date Due   DTaP/Tdap/Td (2 - Td or Tdap) 12/08/2020   FOOT EXAM  03/10/2022   COVID-19 Vaccine (4 - 2024-25 season) 10/11/2022    Colorectal cancer screening: No longer required.   Mammogram status: No longer required due to age.  Bone Density status: Completed 2023. Results reflect: Bone density results: OSTEOPOROSIS. Repeat every 2 years.  Lung Cancer Screening: (Low Dose CT Chest recommended if Age 41-80 years, 20 pack-year currently smoking OR have quit w/in 15years.) does not qualify.   Lung Cancer Screening Referral: na  Additional Screening:  Hepatitis C Screening: does not qualify; Completed  Vision Screening: Recommended annual ophthalmology exams for early detection of glaucoma and other disorders of the eye. Is the patient up to date with their annual eye exam?  Yes  Who is the provider or what is the name of the office in which the patient attends annual eye exams? Dr. Alto Atta If pt is not established with a provider, would they like to be referred to a provider to establish care? No .   Dental Screening: Recommended annual dental exams for proper oral hygiene  Diabetic Foot Exam: Diabetic Foot Exam: Overdue, Pt has been advised about the importance in completing this exam. Pt is scheduled for diabetic foot exam on 08/16/2023.  Community Resource Referral / Chronic Care Management: CRR required this visit?  No   CCM required this visit?  No     Plan:     I have personally reviewed and noted the following in the patient's chart:   Medical and social history Use of alcohol , tobacco or illicit drugs  Current medications and supplements including opioid prescriptions. Patient is not currently taking  opioid prescriptions. Functional ability and status Nutritional status Physical activity Advanced directives List of other physicians Hospitalizations, surgeries, and ER visits in previous 12 months Vitals Screenings to include cognitive, depression, and falls Referrals and appointments  In addition, I have reviewed and discussed with patient certain preventive protocols, quality metrics, and best practice recommendations. A written personalized care plan for preventive services as well as general preventive health recommendations were provided to patient.     Verma Gobble, NP   06/07/2023   After Visit Summary: (MyChart) Due to this being a telephonic visit, the after visit summary with patients personalized plan was offered to patient via MyChart

## 2023-06-07 NOTE — Patient Instructions (Signed)
  Ms. Hesser , Thank you for taking time to come for your Medicare Wellness Visit. I appreciate your ongoing commitment to your health goals. Please review the following plan we discussed and let me know if I can assist you in the future.       Bone density due after Aug 15 Can call [phone] to scheduled  TDAP due to at local pharmacy COVID booster can be done at pharmacy as well   This is a list of the screening recommended for you and due dates:  Health Maintenance  Topic Date Due   DTaP/Tdap/Td vaccine (2 - Td or Tdap) 12/08/2020   Complete foot exam   03/10/2022   COVID-19 Vaccine (4 - 2024-25 season) 10/11/2022   Hemoglobin A1C  06/22/2023   Yearly kidney health urinalysis for diabetes  07/31/2023   Eye exam for diabetics  08/25/2023   Flu Shot  09/10/2023   Yearly kidney function blood test for diabetes  12/23/2023   Medicare Annual Wellness Visit  06/06/2024   Pneumonia Vaccine  Completed   DEXA scan (bone density measurement)  Completed   Zoster (Shingles) Vaccine  Completed   HPV Vaccine  Aged Out   Meningitis B Vaccine  Aged Out

## 2023-06-07 NOTE — Progress Notes (Signed)
 This service is provided via telemedicine  No vital signs collected/recorded due to the encounter was a telemedicine visit.   Location of patient (ex: home, work):  Home  Patient consents to a telephone visit:  na  Location of the provider (ex: office, home):  Office   Name of any referring provider:  na  Names of all persons participating in the telemedicine service and their role in the encounter:  [name], Patient, Anselmo Kings, Daughter, Almira Armour Madhavi Hamblen, CMA, Gilbert Lab, NP  Time spent on call:  7:10

## 2023-06-09 ENCOUNTER — Telehealth: Payer: Self-pay

## 2023-06-09 ENCOUNTER — Ambulatory Visit: Payer: Self-pay

## 2023-06-09 NOTE — Telephone Encounter (Signed)
 PCP Verma Gobble, NP has no upcoming availability. Patient will see Nino Bass, NP 06/11/2023 at 1:20pm

## 2023-06-09 NOTE — Telephone Encounter (Signed)
Message routed to referral coordinator.

## 2023-06-09 NOTE — Telephone Encounter (Signed)
 Fernand Howard D  You29 minutes ago (2:02 PM)   CB Faxed bone density order to [facility] per patient consent. Provided [facility] ph# for scheduling. Faxed to [phone] [email or web address]  Fernand Howard D on 06/09/2023 1359 - delivered at 06/09/2023 1359

## 2023-06-09 NOTE — Telephone Encounter (Signed)
 Message routed to PCP Roselie Conger, Champ Coma, NP as Arlie Lain. Patient scheduled to see you.

## 2023-06-09 NOTE — Telephone Encounter (Signed)
 Copied from CRM [id number]. Topic: Appointments - Scheduling Inquiry for Clinic >> Jun 09, 2023 11:03 AM Madelyne Schiff wrote: Reason for CRM: Per pt [place] " Provider Roselie Conger wanted me to have my bone density in August but the earliest appt was in January, is there somewhere else I can go ? "

## 2023-06-09 NOTE — Telephone Encounter (Signed)
 Noted,  Thank you!

## 2023-06-09 NOTE — Telephone Encounter (Signed)
 Copied From CRM [id number]. Reason for Triage:  Right Great toe blister. ( 1 week)    Chief Complaint: painful blister Symptoms: red blister on great toe, oozing pus Frequency: constant Pertinent Negatives: Patient denies fever Disposition: [] ED /[] Urgent Care (no appt availability in office) / [] Appointment(In office/virtual)/ []  [place] [facility]/ [] Home Care/ [] Refused Recommended Disposition /[] [place] [facility]/ [x]  Follow-up with PCP Additional Notes: Pt reports that she had a pedicure 4 weeks ago and started to develop a painful blister near the nail line of her great toe. RN advising OV within 24 hours, no avail appt with PCP until 5/5. RN offered appts for today or Friday, pt declined, states that she prefers to see her PCP. Will route to clinic [facility] for f/u and scheduling.   Reason for Disposition  [1] Looks infected (spreading redness, red streak, pus) AND [2] no fever  Answer Assessment - Initial Assessment Questions 1. APPEARANCE of BLISTER: "What does it look like?"     Red, swollen, and has brown stuff oozing  2. SIZE: "How large is the blister?" (inches, cm or compare to coins)     Staright across the bottom of the nail, an inch wide  3. LOCATION: "Where are the blisters located?"      Right great toe near the nail  4. WHEN: "When did the blister happen?"     1 week  5. CAUSE: "What do you think caused the blister?"     Unsure of cause  6. PAIN: "Does it hurt?" If Yes, ask: "How bad is the pain?"  (Scale 1-10; or mild, moderate, severe)     Moderate pain  7. OTHER SYMPTOMS: "Do you have any other symptoms?" (e.g., fever)     No  Protocols used: Blister - Foot and Hand-A-AH

## 2023-06-10 ENCOUNTER — Ambulatory Visit (HOSPITAL_COMMUNITY)
Admission: RE | Admit: 2023-06-10 | Discharge: 2023-06-10 | Disposition: A | Discharge status: Home / Self Care | Source: Ambulatory Visit | Attending: Nurse Practitioner | Admitting: Nurse Practitioner

## 2023-06-10 DIAGNOSIS — I350 Nonrheumatic aortic (valve) stenosis: Secondary | ICD-10-CM | POA: Insufficient documentation

## 2023-06-10 LAB — ECHOCARDIOGRAM COMPLETE
AR max vel: 2.05 cm2
AV Area VTI: 2.25 cm2
AV Area mean vel: 2.29 cm2
AV Mean grad: 8 mmHg
AV Peak grad: 16.3 mmHg
Ao pk vel: 2.02 m/s
Area-P 1/2: 3.31 cm2
Est EF: 55
MV M vel: 3.89 m/s
MV Peak grad: 60.5 mmHg
S' Lateral: 3.56 cm

## 2023-06-11 ENCOUNTER — Ambulatory Visit: Admitting: Adult Health

## 2023-06-14 ENCOUNTER — Ambulatory Visit: Admitting: Adult Health

## 2023-06-14 ENCOUNTER — Other Ambulatory Visit: Payer: Self-pay | Admitting: Nurse Practitioner

## 2023-06-14 ENCOUNTER — Encounter (HOSPITAL_BASED_OUTPATIENT_CLINIC_OR_DEPARTMENT_OTHER): Payer: Self-pay

## 2023-06-14 ENCOUNTER — Encounter: Payer: Self-pay | Admitting: Adult Health

## 2023-06-14 ENCOUNTER — Other Ambulatory Visit: Payer: Self-pay | Admitting: Cardiology

## 2023-06-14 VITALS — BP 117/82 | HR 60 | Temp 97.3°F | Ht 66.0 in | Wt 217.4 lb

## 2023-06-14 DIAGNOSIS — I2782 Chronic pulmonary embolism: Secondary | ICD-10-CM

## 2023-06-14 DIAGNOSIS — N1831 Chronic kidney disease, stage 3a: Secondary | ICD-10-CM | POA: Diagnosis not present

## 2023-06-14 DIAGNOSIS — M810 Age-related osteoporosis without current pathological fracture: Secondary | ICD-10-CM | POA: Diagnosis not present

## 2023-06-14 DIAGNOSIS — G2581 Restless legs syndrome: Secondary | ICD-10-CM | POA: Diagnosis not present

## 2023-06-14 DIAGNOSIS — I2609 Other pulmonary embolism with acute cor pulmonale: Secondary | ICD-10-CM

## 2023-06-14 DIAGNOSIS — F419 Anxiety disorder, unspecified: Secondary | ICD-10-CM

## 2023-06-14 DIAGNOSIS — E1122 Type 2 diabetes mellitus with diabetic chronic kidney disease: Secondary | ICD-10-CM | POA: Diagnosis not present

## 2023-06-14 DIAGNOSIS — N1832 Chronic kidney disease, stage 3b: Secondary | ICD-10-CM

## 2023-06-14 DIAGNOSIS — L03119 Cellulitis of unspecified part of limb: Secondary | ICD-10-CM

## 2023-06-14 MED ORDER — DOXYCYCLINE HYCLATE 100 MG PO TABS: 100.0000 mg | ORAL_TABLET | Freq: Two times a day (BID) | ORAL | 0 refills | Status: AC

## 2023-06-14 NOTE — Progress Notes (Unsigned)
 [place] [facility] clinic  Provider:  Inge Mangle DNP  Code Status:  Full Code  Goals of Care:     06/07/2023    2:54 PM  Advanced Directives  Does Patient Have a Medical Advance Directive? Yes  Type of Estate agent of [place];Living will  Does patient want to make changes to medical advance directive? No - Patient declined  Copy of Healthcare Power of Attorney in Chart? No - copy requested     Chief Complaint  Patient presents with   Blister    She had got a pedicure about 2 months , since had  redness and blister, swelling , sore , had some drainage, she soak them , using ointment, no fever     HPI: Patient is a 83 y.o. female seen today for an acute visit for Discussed the use of AI scribe software for clinical note transcription with the patient, who gave verbal consent to proceed.  History of Present Illness      Past Medical History:  Diagnosis Date   Actinic keratosis    Arthritis    Asthma    Back injury 2019   Fracture   Basal cell carcinoma    COPD (chronic obstructive pulmonary disease) (HCC)    GERD (gastroesophageal reflux disease)    H/O blood clots 2020   Lung, Per [facility] New Patient Packet   H/O mammogram 2022   Per [facility] New Patient Packet   Heart murmur    hx of in childhood    High cholesterol    Hyperlipidemia    Hypertension    Hypothyroidism    Kidney disease    Per [facility] New Patient Packet   MCNS (minimal change nephrotic syndrome)    OSA (obstructive sleep apnea) 08/29/2019   Osteopenia    Shortness of breath dyspnea    on exertion    Squamous cell carcinoma    Thyroid  disease    Per [facility] New Patient Packet    Past Surgical History:  Procedure Laterality Date   ABDOMINAL HYSTERECTOMY     APPENDECTOMY     BILATERAL SALPINGOOPHORECTOMY     Ovarian Cysts    CHOLECYSTECTOMY N/A 06/17/2022   Procedure: LAPAROSCOPIC CHOLECYSTECTOMY;  Surgeon: Lujean Sake, MD;  [facility];  Service: General;  Laterality: N/A;    COLONOSCOPY  2001   Dr.Mann, Per [facility] New Patient Packet   CONVERSION TO TOTAL KNEE Right 07/10/2014   Procedure: RIGHT CONVERSION OF PARTIAL KNEE TO TOTAL KNEE;  Surgeon: Claiborne Crew, MD;  [facility];  Service: Orthopedics;  Laterality: Right;   ENDOSCOPIC RETROGRADE CHOLANGIOPANCREATOGRAPHY (ERCP) WITH PROPOFOL  N/A 06/15/2022   Procedure: ENDOSCOPIC RETROGRADE CHOLANGIOPANCREATOGRAPHY (ERCP) WITH PROPOFOL ;  Surgeon: Ozell Blunt, MD;  [facility];  Service: Gastroenterology;  Laterality: N/A;   INTRAMEDULLARY (IM) NAIL INTERTROCHANTERIC Left 11/02/2022   Procedure: INTRAMEDULLARY NAILING OF LEFT FEMUR;  Surgeon: Wes Hamman, MD;  [facility];  Service: Orthopedics;  Laterality: Left;   IR ANGIOGRAM PULMONARY BILATERAL SELECTIVE  02/08/2019   IR ANGIOGRAM SELECTIVE EACH ADDITIONAL VESSEL  02/08/2019   IR ANGIOGRAM SELECTIVE EACH ADDITIONAL VESSEL  02/08/2019   IR INFUSION THROMBOL ARTERIAL INITIAL (MS)  02/08/2019   IR INFUSION THROMBOL ARTERIAL INITIAL (MS)  02/08/2019   IR THROMB F/U EVAL ART/VEN FINAL DAY (MS)  02/09/2019   IR US  GUIDE VASC ACCESS RIGHT  02/08/2019   MEDIAL PARTIAL KNEE REPLACEMENT Bilateral    MOHS SURGERY     x 2  REMOVAL OF STONES  06/15/2022   Procedure: REMOVAL OF STONES;  Surgeon: Ozell Blunt, MD;  [facility];  Service: Gastroenterology;;   RENAL BIOPSY     x 2   REPLACEMENT TOTAL KNEE  2012   Per [facility] New Patient Packet   ROTATOR CUFF REPAIR Left 2008   SPHINCTEROTOMY  06/15/2022   Procedure: SPHINCTEROTOMY;  Surgeon: Ozell Blunt, MD;  [facility];  Service: Gastroenterology;;    No Known Allergies  Outpatient Encounter Medications as of 06/14/2023  Medication Sig   albuterol  (VENTOLIN  HFA) 108 (90 Base) MCG/ACT inhaler Inhale 1-2 puffs into the lungs every 6 (six) hours as needed for wheezing or shortness of breath.   apixaban  (ELIQUIS ) 5 MG TABS tablet TAKE 1 TABLET BY MOUTH TWICE A DAY   citalopram  (CELEXA ) 20 MG  tablet TAKE 1 TABLET (20 MG TOTAL) BY MOUTH AT BEDTIME. DX F41.9   docusate sodium  (COLACE) 100 MG capsule Take 1 capsule (100 mg total) by mouth 2 (two) times daily.   donepezil  (ARICEPT ) 10 MG tablet TAKE 1 TABLET BY MOUTH EVERYDAY AT BEDTIME   doxycycline (VIBRA-TABS) 100 MG tablet Take 1 tablet (100 mg total) by mouth 2 (two) times daily for 10 days.   ferrous sulfate  325 (65 FE) MG EC tablet TAKE 1 TABLET BY MOUTH EVERY DAY WITH BREAKFAST   fluticasone  (FLONASE ) 50 MCG/ACT nasal spray SPRAY 1 SPRAY INTO EACH NOSTRIL DAILY   ipratropium (ATROVENT ) 0.03 % nasal spray SPRAY 2 SPRAYS INTO EACH NOSTRIL 3 TIMES A DAY AS NEEDED FOR RHINITIS   levothyroxine  (SYNTHROID ) 88 MCG tablet TAKE 1 TABLET BY MOUTH EVERY DAY IN THE MORNING   losartan  (COZAAR ) 100 MG tablet TAKE 1 TABLET BY MOUTH EVERY DAY   Melatonin 10 MG TABS Take 10 mg by mouth at bedtime.   metFORMIN  (GLUCOPHAGE ) 500 MG tablet Take 0.5 tablets (250 mg total) by mouth daily with supper.   metoprolol  tartrate (LOPRESSOR ) 25 MG tablet Take 1 tablet (25 mg total) by mouth 2 (two) times daily.   montelukast  (SINGULAIR ) 10 MG tablet TAKE 1 TABLET BY MOUTH EVERYDAY AT BEDTIME   Multiple Vitamin (MULTIVITAMIN WITH MINERALS) TABS tablet Take 1 tablet by mouth daily with breakfast.   pantoprazole  (PROTONIX ) 40 MG tablet Take 1 tablet (40 mg total) by mouth at bedtime.   rOPINIRole  (REQUIP ) 1 MG tablet Take 1 tablet (1 mg total) by mouth 2 (two) times daily.   rosuvastatin  (CRESTOR ) 20 MG tablet Take 1 tablet (20 mg total) by mouth daily.   tacrolimus  (PROGRAF ) 1 MG capsule Take 1 mg by mouth in the morning and at bedtime.   traMADol  (ULTRAM ) 50 MG tablet TAKE 1 TABLET BY MOUTH EVERY 12 HOURS AS NEEDED.   TRELEGY ELLIPTA  100-62.5-25 MCG/ACT AEPB INHALE 1 PUFF BY MOUTH EVERY DAY   TYLENOL  325 MG CAPS Take 325-650 mg by mouth every 6 (six) hours as needed (for pain or headaches).   [DISCONTINUED] benzonatate  (TESSALON ) 200 MG capsule Take 1 capsule  (200 mg total) by mouth 3 (three) times daily as needed for cough. (Patient not taking: Reported on 06/07/2023)   Facility-Administered Encounter Medications as of 06/14/2023  Medication   denosumab  (PROLIA ) injection 60 mg    Review of Systems:  Review of Systems  Constitutional:  Negative for appetite change, chills, fatigue and fever.  HENT:  Negative for congestion, hearing loss, rhinorrhea and sore throat.   Eyes: Negative.   Respiratory:  Negative for cough, shortness of breath and  wheezing.   Cardiovascular:  Negative for chest pain, palpitations and leg swelling.  Gastrointestinal:  Negative for abdominal pain, constipation, diarrhea, nausea and vomiting.  Genitourinary:  Negative for dysuria.  Musculoskeletal:  Negative for arthralgias, back pain and myalgias.  Skin:  Negative for color change, rash and wound.  Neurological:  Negative for dizziness, weakness and headaches.  Psychiatric/Behavioral:  Negative for behavioral problems. The patient is not nervous/anxious.     Health Maintenance  Topic Date Due   DTaP/Tdap/Td (2 - Td or Tdap) 12/08/2020   FOOT EXAM  03/10/2022   COVID-19 Vaccine (4 - 2024-25 season) 10/11/2022   HEMOGLOBIN A1C  06/22/2023   Diabetic kidney evaluation - Urine ACR  07/31/2023   OPHTHALMOLOGY EXAM  08/25/2023   INFLUENZA VACCINE  09/10/2023   Diabetic kidney evaluation - eGFR measurement  12/23/2023   Medicare Annual Wellness (AWV)  06/06/2024   Pneumonia Vaccine 65+ Years old  Completed   DEXA SCAN  Completed   Zoster Vaccines- Shingrix  Completed   HPV VACCINES  Aged Out   Meningococcal B Vaccine  Aged Out    Physical Exam: Vitals:   06/14/23 1445  BP: 117/82  Pulse: 60  Temp: (!) 97.3 F (36.3 C)  TempSrc: Temporal  SpO2: 91%  Weight: 217 lb 6.4 oz (98.6 kg)  Height: 5\' 6"  (1.676 m)   Body mass index is 35.09 kg/m. Physical Exam Constitutional:      Appearance: Normal appearance.  HENT:     Head: Normocephalic and atraumatic.      Nose: Nose normal.     Mouth/Throat:     Mouth: Mucous membranes are moist.  Eyes:     Conjunctiva/sclera: Conjunctivae normal.  Cardiovascular:     Rate and Rhythm: Normal rate and regular rhythm.  Pulmonary:     Effort: Pulmonary effort is normal.     Breath sounds: Normal breath sounds.  Abdominal:     General: Bowel sounds are normal.     Palpations: Abdomen is soft.  Musculoskeletal:        General: Normal range of motion.     Cervical back: Normal range of motion.  Skin:    General: Skin is warm and dry.  Neurological:     General: No focal deficit present.     Mental Status: She is alert and oriented to person, place, and time.  Psychiatric:        Mood and Affect: Mood normal.        Behavior: Behavior normal.        Thought Content: Thought content normal.        Judgment: Judgment normal.     Labs reviewed: Basic Metabolic Panel: Recent Labs    06/17/22 0358 06/18/22 0416 11/04/22 0305 11/05/22 0417 11/06/22 1112 12/23/22 1501  NA 136   < > 136 138 141 141  K 3.3*   < > 4.7 4.9 4.6 4.9  CL 98   < > 102 104 104 103  CO2 29   < > 27 26 28 28   GLUCOSE 135*   < > 133* 144* 182* 99  BUN 14   < > 47* 50* 40* 23  CREATININE 0.91   < > 1.42* 1.31* 1.02* 1.19*  CALCIUM  8.0*   < > 8.3* 8.2* 8.6* 9.7  MG 1.5*   < > 2.3 2.2 2.1  --   PHOS 3.2  --   --  3.8 3.8  --   TSH  --   --   --   --   --  2.91   < > = values in this interval not displayed.   Liver Function Tests: Recent Labs    06/20/22 0352 06/21/22 0352 07/31/22 1533 11/05/22 0417  AST 21 18 15 24   ALT 31 25 11 12   ALKPHOS 41 46  --  34*  BILITOT 0.6 0.7 0.6 0.6  PROT 6.7 7.1 7.1 5.7*  ALBUMIN 2.9* 3.1*  --  2.3*   Recent Labs    06/15/22 1015  LIPASE 47   No results for input(s): "AMMONIA" in the last 8760 hours. CBC: Recent Labs    11/02/22 0403 11/03/22 0433 11/04/22 0305 11/05/22 0417 11/05/22 0854 11/05/22 1711 12/23/22 1501  WBC 11.0* 11.5* 11.2* 9.7  --   --  10.0   NEUTROABS 7.4 9.1*  --   --   --   --  6,850  HGB 10.8* 9.3* 8.2* 7.4* 7.7* 8.4* 12.8  HCT 35.1* 30.2* 26.5* 24.2* 24.7* 27.0* 40.6  MCV 94.4 93.2 94.0 92.0  --   --  94.0  PLT 211 194 198 229  --   --  319   Lipid Panel: Recent Labs    12/23/22 1501  CHOL 112  HDL 52  LDLCALC 33  TRIG 204*  CHOLHDL 2.2   Lab Results  Component Value Date   HGBA1C 5.2 12/23/2022    Procedures since last visit: ECHOCARDIOGRAM COMPLETE Result Date: 06/10/2023    ECHOCARDIOGRAM REPORT   Patient Name:   KAMI MAULDEN Date of Exam: 06/10/2023 Medical Rec #:  [id number]              Height:       66.0 in Accession #:    [id number]             Weight:       212.0 lb Date of Birth:  July 29, 1940              BSA:          2.050 m Patient Age:    82 years               BP:           126/78 mmHg Patient Gender: F                      HR:           58 bpm. Exam Location:  Outpatient Procedure: 2D Echo, Cardiac Doppler and Color Doppler (Both Spectral and Color            Flow Doppler were utilized during procedure). Indications:    Mild aortic stenosis [I35.0 (ICD-10-CM)]  History:        Patient has prior history of Echocardiogram examinations, most                 recent 06/15/2022. COPD; Risk Factors:Hypertension, Sleep Apnea,                 Diabetes, Dyslipidemia and Former Smoker.  Sonographer:    Delford Felling MHA, RDMS, RVT, RDCS Referring Phys: [id number] EMILY C MONGE  Sonographer Comments: Patient is obese. Image acquisition challenging due to patient body habitus, Image acquisition challenging due to respiratory motion, Image acquisition challenging due to COPD and patient position/restricted mobility. IMPRESSIONS  1. Left ventricular ejection fraction, by estimation, is 55%. The left ventricle has normal function. The left ventricle has no regional wall motion abnormalities. Left ventricular diastolic parameters are consistent with Grade I diastolic  dysfunction (impaired relaxation).  2. Right ventricular  systolic function is normal. The right ventricular size is normal.  3. The mitral valve is degenerative. No evidence of mitral valve regurgitation. No evidence of mitral stenosis.  4. The aortic valve is tricuspid. Aortic valve regurgitation is not visualized. Aortic valve sclerosis is present, with no evidence of aortic valve stenosis. Aortic valve area, by VTI measures 2.25 cm. Aortic valve mean gradient measures 8.0 mmHg. Aortic valve Vmax measures 2.02 m/s.  5. The inferior vena cava is normal in size with greater than 50% respiratory variability, suggesting right atrial pressure of 3 mmHg. Comparison(s): Prior images reviewed side by side. No evidence of aortic stenosis. Prior study overestimated flow. FINDINGS  Left Ventricle: Left ventricular ejection fraction, by estimation, is 55%. The left ventricle has normal function. The left ventricle has no regional wall motion abnormalities. Strain was performed and the global longitudinal strain is indeterminate. The left ventricular internal cavity size was normal in size. There is no left ventricular hypertrophy. Left ventricular diastolic parameters are consistent with Grade I diastolic dysfunction (impaired relaxation). Right Ventricle: The right ventricular size is normal. No increase in right ventricular wall thickness. Right ventricular systolic function is normal. Left Atrium: Left atrial size was normal in size. Right Atrium: Right atrial size was normal in size. Pericardium: There is no evidence of pericardial effusion. Mitral Valve: The mitral valve is degenerative in appearance. Mild mitral annular calcification. No evidence of mitral valve regurgitation. No evidence of mitral valve stenosis. Tricuspid Valve: The tricuspid valve is not well visualized. Tricuspid valve regurgitation is not demonstrated. No evidence of tricuspid stenosis. Aortic Valve: The aortic valve is tricuspid. Aortic valve regurgitation is not visualized. Aortic valve sclerosis is  present, with no evidence of aortic valve stenosis. Aortic valve mean gradient measures 8.0 mmHg. Aortic valve peak gradient measures 16.3 mmHg. Aortic valve area, by VTI measures 2.25 cm. Pulmonic Valve: The pulmonic valve was not well visualized. Pulmonic valve regurgitation is trivial. Aorta: The aortic root and ascending aorta are structurally normal, with no evidence of dilitation. Venous: The inferior vena cava is normal in size with greater than 50% respiratory variability, suggesting right atrial pressure of 3 mmHg. IAS/Shunts: The atrial septum is grossly normal. Additional Comments: 3D was performed not requiring image post processing on an independent workstation and was indeterminate.  LEFT VENTRICLE PLAX 2D LVIDd:         4.71 cm   Diastology LVIDs:         3.56 cm   LV e' medial:    5.77 cm/s LV PW:         1.05 cm   LV E/e' medial:  17.3 LV IVS:        0.88 cm   LV e' lateral:   6.74 cm/s LVOT diam:     2.07 cm   LV E/e' lateral: 14.8 LV SV:         93 LV SV Index:   45 LVOT Area:     3.37 cm  RIGHT VENTRICLE             IVC RV Basal diam:  3.50 cm     IVC diam: 1.56 cm RV Mid diam:    2.24 cm RV S prime:     12.20 cm/s TAPSE (M-mode): 2.3 cm LEFT ATRIUM             Index        RIGHT ATRIUM  Index LA diam:        3.49 cm 1.70 cm/m   RA Area:     16.00 cm LA Vol (A2C):   46.2 ml 22.54 ml/m  RA Volume:   41.40 ml  20.20 ml/m LA Vol (A4C):   65.4 ml 31.90 ml/m LA Biplane Vol: 57.4 ml 28.00 ml/m  AORTIC VALVE AV Area (Vmax):    2.05 cm AV Area (Vmean):   2.29 cm AV Area (VTI):     2.25 cm AV Vmax:           202.00 cm/s AV Vmean:          114.533 cm/s AV VTI:            0.413 m AV Peak Grad:      16.3 mmHg AV Mean Grad:      8.0 mmHg LVOT Vmax:         123.00 cm/s LVOT Vmean:        78.100 cm/s LVOT VTI:          0.276 m LVOT/AV VTI ratio: 0.67  AORTA Ao Root diam: 2.85 cm Ao Asc diam:  3.10 cm MITRAL VALVE MV Area (PHT): 3.31 cm     SHUNTS MV Decel Time: 229 msec     Systemic VTI:   0.28 m MR Peak grad: 60.5 mmHg     Systemic Diam: 2.07 cm MR Vmax:      389.00 cm/s MV E velocity: 100.00 cm/s MV A velocity: 130.00 cm/s MV E/A ratio:  0.77 Gloriann Larger MD Electronically signed by Gloriann Larger MD Signature Date/Time: 06/10/2023/2:17:41 PM    Final    XR FEMUR MIN 2 VIEWS LEFT Result Date: 06/01/2023 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  No hardware complication.   Assessment/Plan Assessment and Plan Assessment & Plan     ***   Labs/tests ordered:  * No order type specified *   No follow-ups on file.  Montanna Mcbain Medina-Vargas, NP

## 2023-06-15 ENCOUNTER — Telehealth: Payer: Self-pay

## 2023-06-15 NOTE — Telephone Encounter (Signed)
Spoke with pt. Pt was notified of echocardiogram results. Pt will continue current medication and f/u as planned.

## 2023-06-17 ENCOUNTER — Other Ambulatory Visit

## 2023-06-17 ENCOUNTER — Other Ambulatory Visit: Payer: Self-pay

## 2023-06-17 DIAGNOSIS — E1122 Type 2 diabetes mellitus with diabetic chronic kidney disease: Secondary | ICD-10-CM | POA: Diagnosis not present

## 2023-06-17 DIAGNOSIS — D508 Other iron deficiency anemias: Secondary | ICD-10-CM | POA: Diagnosis not present

## 2023-06-17 DIAGNOSIS — M810 Age-related osteoporosis without current pathological fracture: Secondary | ICD-10-CM

## 2023-06-17 DIAGNOSIS — N1832 Chronic kidney disease, stage 3b: Secondary | ICD-10-CM | POA: Diagnosis not present

## 2023-06-18 ENCOUNTER — Encounter: Payer: Self-pay | Admitting: Nurse Practitioner

## 2023-06-18 LAB — CBC WITH DIFFERENTIAL/PLATELET
Absolute Lymphocytes: 1640 {cells}/uL (ref 850–3900)
Absolute Monocytes: 724 {cells}/uL (ref 200–950)
Basophils Absolute: 57 {cells}/uL (ref 0–200)
Basophils Relative: 0.8 %
Eosinophils Absolute: 227 {cells}/uL (ref 15–500)
Eosinophils Relative: 3.2 %
HCT: 40.5 % (ref 35.0–45.0)
Hemoglobin: 12.7 g/dL (ref 11.7–15.5)
MCH: 27.7 pg (ref 27.0–33.0)
MCHC: 31.4 g/dL — ABNORMAL LOW (ref 32.0–36.0)
MCV: 88.4 fL (ref 80.0–100.0)
MPV: 10.2 fL (ref 7.5–12.5)
Monocytes Relative: 10.2 %
Neutro Abs: 4452 {cells}/uL (ref 1500–7800)
Neutrophils Relative %: 62.7 %
Platelets: 255 10*3/uL (ref 140–400)
RBC: 4.58 10*6/uL (ref 3.80–5.10)
RDW: 13.6 % (ref 11.0–15.0)
Total Lymphocyte: 23.1 %
WBC: 7.1 10*3/uL (ref 3.8–10.8)

## 2023-06-18 LAB — HEMOGLOBIN A1C
Hgb A1c MFr Bld: 6.3 % — ABNORMAL HIGH (ref <5.7)
Mean Plasma Glucose: 134 mg/dL
eAG (mmol/L): 7.4 mmol/L

## 2023-06-18 LAB — COMPLETE METABOLIC PANEL WITHOUT GFR
AG Ratio: 1.2 (calc) (ref 1.0–2.5)
ALT: 10 U/L (ref 6–29)
AST: 13 U/L (ref 10–35)
Albumin: 3.9 g/dL (ref 3.6–5.1)
Alkaline phosphatase (APISO): 49 U/L (ref 37–153)
BUN/Creatinine Ratio: 21 (calc) (ref 6–22)
BUN: 20 mg/dL (ref 7–25)
CO2: 30 mmol/L (ref 20–32)
Calcium: 9.4 mg/dL (ref 8.6–10.4)
Chloride: 104 mmol/L (ref 98–110)
Creat: 0.96 mg/dL — ABNORMAL HIGH (ref 0.60–0.95)
Globulin: 3.2 g/dL (ref 1.9–3.7)
Glucose, Bld: 120 mg/dL — ABNORMAL HIGH (ref 65–99)
Potassium: 4.1 mmol/L (ref 3.5–5.3)
Sodium: 141 mmol/L (ref 135–146)
Total Bilirubin: 0.5 mg/dL (ref 0.2–1.2)
Total Protein: 7.1 g/dL (ref 6.1–8.1)

## 2023-06-21 ENCOUNTER — Ambulatory Visit: Admitting: Adult Health

## 2023-06-28 ENCOUNTER — Ambulatory Visit: Admitting: Nurse Practitioner

## 2023-06-29 ENCOUNTER — Other Ambulatory Visit: Payer: Self-pay

## 2023-06-29 ENCOUNTER — Telehealth: Payer: Self-pay | Admitting: Orthopaedic Surgery

## 2023-06-29 DIAGNOSIS — S72142A Displaced intertrochanteric fracture of left femur, initial encounter for closed fracture: Secondary | ICD-10-CM

## 2023-06-29 NOTE — Telephone Encounter (Signed)
OK for this

## 2023-06-29 NOTE — Telephone Encounter (Signed)
 Patient called. Would like a referral for PT sent to [facility] Physical therapy.

## 2023-06-29 NOTE — Telephone Encounter (Signed)
 Order entered. Notified patient via voicemail.

## 2023-06-29 NOTE — Telephone Encounter (Signed)
 yes

## 2023-07-01 DIAGNOSIS — L603 Nail dystrophy: Secondary | ICD-10-CM | POA: Diagnosis not present

## 2023-07-01 DIAGNOSIS — M2042 Other hammer toe(s) (acquired), left foot: Secondary | ICD-10-CM | POA: Diagnosis not present

## 2023-07-01 DIAGNOSIS — R262 Difficulty in walking, not elsewhere classified: Secondary | ICD-10-CM | POA: Diagnosis not present

## 2023-07-01 DIAGNOSIS — B351 Tinea unguium: Secondary | ICD-10-CM | POA: Diagnosis not present

## 2023-07-01 DIAGNOSIS — M2041 Other hammer toe(s) (acquired), right foot: Secondary | ICD-10-CM | POA: Diagnosis not present

## 2023-07-01 DIAGNOSIS — I739 Peripheral vascular disease, unspecified: Secondary | ICD-10-CM | POA: Diagnosis not present

## 2023-07-06 ENCOUNTER — Ambulatory Visit (INDEPENDENT_AMBULATORY_CARE_PROVIDER_SITE_OTHER): Payer: Medicare PPO | Admitting: Nurse Practitioner

## 2023-07-06 DIAGNOSIS — M810 Age-related osteoporosis without current pathological fracture: Secondary | ICD-10-CM | POA: Diagnosis not present

## 2023-07-06 MED ORDER — DENOSUMAB 60 MG/ML ~~LOC~~ SOSY
60.0000 mg | PREFILLED_SYRINGE | SUBCUTANEOUS | Status: AC
  Administered 2023-07-06: 60 mg via SUBCUTANEOUS

## 2023-07-07 DIAGNOSIS — C44729 Squamous cell carcinoma of skin of left lower limb, including hip: Secondary | ICD-10-CM | POA: Diagnosis not present

## 2023-07-09 DIAGNOSIS — I739 Peripheral vascular disease, unspecified: Secondary | ICD-10-CM | POA: Diagnosis not present

## 2023-07-09 DIAGNOSIS — M2042 Other hammer toe(s) (acquired), left foot: Secondary | ICD-10-CM | POA: Diagnosis not present

## 2023-07-09 DIAGNOSIS — L603 Nail dystrophy: Secondary | ICD-10-CM | POA: Diagnosis not present

## 2023-07-09 DIAGNOSIS — M19072 Primary osteoarthritis, left ankle and foot: Secondary | ICD-10-CM | POA: Diagnosis not present

## 2023-07-09 DIAGNOSIS — M2041 Other hammer toe(s) (acquired), right foot: Secondary | ICD-10-CM | POA: Diagnosis not present

## 2023-07-09 DIAGNOSIS — M19071 Primary osteoarthritis, right ankle and foot: Secondary | ICD-10-CM | POA: Diagnosis not present

## 2023-07-09 DIAGNOSIS — B351 Tinea unguium: Secondary | ICD-10-CM | POA: Diagnosis not present

## 2023-07-09 DIAGNOSIS — R262 Difficulty in walking, not elsewhere classified: Secondary | ICD-10-CM | POA: Diagnosis not present

## 2023-07-13 ENCOUNTER — Encounter (HOSPITAL_BASED_OUTPATIENT_CLINIC_OR_DEPARTMENT_OTHER): Admitting: Pulmonary Disease

## 2023-07-14 ENCOUNTER — Other Ambulatory Visit: Payer: Self-pay | Admitting: Nurse Practitioner

## 2023-07-14 ENCOUNTER — Other Ambulatory Visit: Payer: Self-pay | Admitting: Cardiology

## 2023-07-14 DIAGNOSIS — G3184 Mild cognitive impairment, so stated: Secondary | ICD-10-CM

## 2023-07-15 DIAGNOSIS — L814 Other melanin hyperpigmentation: Secondary | ICD-10-CM | POA: Diagnosis not present

## 2023-07-15 DIAGNOSIS — L570 Actinic keratosis: Secondary | ICD-10-CM | POA: Diagnosis not present

## 2023-07-15 DIAGNOSIS — L578 Other skin changes due to chronic exposure to nonionizing radiation: Secondary | ICD-10-CM | POA: Diagnosis not present

## 2023-07-15 DIAGNOSIS — C44529 Squamous cell carcinoma of skin of other part of trunk: Secondary | ICD-10-CM | POA: Diagnosis not present

## 2023-07-15 DIAGNOSIS — D225 Melanocytic nevi of trunk: Secondary | ICD-10-CM | POA: Diagnosis not present

## 2023-07-15 DIAGNOSIS — D2262 Melanocytic nevi of left upper limb, including shoulder: Secondary | ICD-10-CM | POA: Diagnosis not present

## 2023-07-15 DIAGNOSIS — L720 Epidermal cyst: Secondary | ICD-10-CM | POA: Diagnosis not present

## 2023-07-15 DIAGNOSIS — L821 Other seborrheic keratosis: Secondary | ICD-10-CM | POA: Diagnosis not present

## 2023-07-18 DIAGNOSIS — L603 Nail dystrophy: Secondary | ICD-10-CM | POA: Diagnosis not present

## 2023-07-29 DIAGNOSIS — Z4789 Encounter for other orthopedic aftercare: Secondary | ICD-10-CM | POA: Diagnosis not present

## 2023-07-29 DIAGNOSIS — M25552 Pain in left hip: Secondary | ICD-10-CM | POA: Diagnosis not present

## 2023-08-03 ENCOUNTER — Ambulatory Visit: Admitting: Physician Assistant

## 2023-08-03 DIAGNOSIS — M25552 Pain in left hip: Secondary | ICD-10-CM | POA: Diagnosis not present

## 2023-08-03 DIAGNOSIS — Z4789 Encounter for other orthopedic aftercare: Secondary | ICD-10-CM | POA: Diagnosis not present

## 2023-08-05 DIAGNOSIS — M25552 Pain in left hip: Secondary | ICD-10-CM | POA: Diagnosis not present

## 2023-08-05 DIAGNOSIS — Z4789 Encounter for other orthopedic aftercare: Secondary | ICD-10-CM | POA: Diagnosis not present

## 2023-08-09 DIAGNOSIS — M25552 Pain in left hip: Secondary | ICD-10-CM | POA: Diagnosis not present

## 2023-08-09 DIAGNOSIS — Z4789 Encounter for other orthopedic aftercare: Secondary | ICD-10-CM | POA: Diagnosis not present

## 2023-08-10 ENCOUNTER — Ambulatory Visit: Admitting: Physician Assistant

## 2023-08-10 ENCOUNTER — Other Ambulatory Visit (INDEPENDENT_AMBULATORY_CARE_PROVIDER_SITE_OTHER): Payer: Self-pay

## 2023-08-10 DIAGNOSIS — S72142A Displaced intertrochanteric fracture of left femur, initial encounter for closed fracture: Secondary | ICD-10-CM

## 2023-08-10 NOTE — Progress Notes (Signed)
 Post-Op Visit Note   Patient: [name]           Date of Birth: [birth year]           MRN: [id number] Visit Date: 08/10/2023 PCP: [name], NP   Assessment & Plan:  Chief Complaint:  Chief Complaint  Patient presents with   Left Hip - Follow-up    Left him IM nailing 11/02/2022   Visit Diagnoses:  1. Closed comminuted intertrochanteric fracture of proximal femur, left, initial encounter [facility])     Plan: Patient is a very pleasant 83 year old female who comes in today approximately 9 months status post left hip IM nail from an intertrochanteric femur fracture.  She has been doing significantly better.  Minimal to no pain.  She has been ambulating with a single-point cane.  She has been in outpatient physical therapy making good progress.  She has also been using the bone stimulator 3 hours a day for the past few months.  Examination of her left hip reveals painless hip flexion and logroll.  She is neurovascularly intact distally.  At this point, she is clinically doing very well however x-rays still show persistent fracture lucency.  I like for her to continue using her bone stimulator for another 3 months.  Follow-up in 3 months for repeat evaluation and x-rays of the left femur.  Call with concerns or questions in meantime.  Follow-Up Instructions: Return in about 3 months (around 11/10/2023).   Orders:  Orders Placed This Encounter  Procedures   XR FEMUR MIN 2 VIEWS LEFT   No orders of the defined types were placed in this encounter.   Imaging: XR FEMUR MIN 2 VIEWS LEFT Result Date: 08/10/2023 X-rays demonstrate persistent fracture lucency through the fracture site.  No hardware complication.   PMFS History: Patient Active Problem List   Diagnosis Date Noted   Sensorineural hearing loss, bilateral 04/27/2023   Impacted cerumen of both ears 04/27/2023   Other specified disorders of eustachian tube, right ear 04/27/2023   Closed comminuted  intertrochanteric fracture of proximal femur, left, initial encounter (HCC) 10/31/2022   Accidental fall 10/31/2022   Chronic anticoagulation 10/31/2022   History of pulmonary embolism 10/31/2022   History of PAT (paroxysmal atrial tachycardia) 10/31/2022   Closed left hip fracture, initial encounter (HCC) 10/31/2022   Acute pancreatitis 06/13/2022   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/26/2022   Acquired hypercoagulable state (HCC) 01/26/2022   Bilateral hearing loss 11/04/2020   Diabetes mellitus without complication (HCC) 11/04/2020   RLS (restless legs syndrome) 11/04/2020   OSA (obstructive sleep apnea) 08/29/2019   Abnormal findings on diagnostic imaging of lung 08/29/2019   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   Pulmonary embolism (HCC) 02/07/2019   GERD without esophagitis 05/15/2017   Dyslipidemia 05/15/2017   Anxiety 05/15/2017   Constipation due to opioid therapy 05/15/2017   Accidental fall from chair, initial encounter 05/08/2017   Closed posttraumatic compression fracture of lumbar vertebra (HCC) 05/08/2017   Leukocytosis 05/08/2017   S/P right TKA 07/10/2014   S/P knee replacement 07/10/2014   COPD (chronic obstructive pulmonary disease) (HCC) 09/24/2013   Hypothyroidism 07/24/2012   Essential hypertension, benign 07/24/2012   Allergic rhinitis 07/24/2012   Mixed hyperlipidemia 07/24/2012   Past Medical History:  Diagnosis Date   Actinic keratosis    Arthritis    Asthma    Back injury 2019   Fracture   Basal cell carcinoma    COPD (chronic obstructive pulmonary disease) (HCC)  GERD (gastroesophageal reflux disease)    H/O blood clots 2020   Lung, Per [facility] New Patient Packet   H/O mammogram 2022   Per [facility] New Patient Packet   Heart murmur    hx of in childhood    High cholesterol    Hyperlipidemia    Hypertension    Hypothyroidism    Kidney disease    Per [facility] New Patient Packet   MCNS (minimal change nephrotic syndrome)    OSA  (obstructive sleep apnea) 08/29/2019   Osteopenia    Shortness of breath dyspnea    on exertion    Squamous cell carcinoma    Thyroid  disease    Per [facility] New Patient Packet    Family History  Problem Relation Age of Onset   COPD Mother        Husband Smoked    Lymphoma Father    Hypertension Sister    Scoliosis Daughter    Stroke Maternal Grandfather    Rheumatic fever Paternal Grandmother    Heart disease Paternal Grandfather     Past Surgical History:  Procedure Laterality Date   ABDOMINAL HYSTERECTOMY     APPENDECTOMY     BILATERAL SALPINGOOPHORECTOMY     Ovarian Cysts    CHOLECYSTECTOMY N/A 06/17/2022   Procedure: LAPAROSCOPIC CHOLECYSTECTOMY;  Surgeon: Dasie Leonor CROME, MD;  [facility];  Service: General;  Laterality: N/A;   COLONOSCOPY  2001   Dr.Mann, Per [facility] New Patient Packet   CONVERSION TO TOTAL KNEE Right 07/10/2014   Procedure: RIGHT CONVERSION OF PARTIAL KNEE TO TOTAL KNEE;  Surgeon: Donnice Car, MD;  [facility];  Service: Orthopedics;  Laterality: Right;   ENDOSCOPIC RETROGRADE CHOLANGIOPANCREATOGRAPHY (ERCP) WITH PROPOFOL  N/A 06/15/2022   Procedure: ENDOSCOPIC RETROGRADE CHOLANGIOPANCREATOGRAPHY (ERCP) WITH PROPOFOL ;  Surgeon: Rosalie Kitchens, MD;  [facility];  Service: Gastroenterology;  Laterality: N/A;   INTRAMEDULLARY (IM) NAIL INTERTROCHANTERIC Left 11/02/2022   Procedure: INTRAMEDULLARY NAILING OF LEFT FEMUR;  Surgeon: Jerri Kay HERO, MD;  [facility];  Service: Orthopedics;  Laterality: Left;   IR ANGIOGRAM PULMONARY BILATERAL SELECTIVE  02/08/2019   IR ANGIOGRAM SELECTIVE EACH ADDITIONAL VESSEL  02/08/2019   IR ANGIOGRAM SELECTIVE EACH ADDITIONAL VESSEL  02/08/2019   IR INFUSION THROMBOL ARTERIAL INITIAL (MS)  02/08/2019   IR INFUSION THROMBOL ARTERIAL INITIAL (MS)  02/08/2019   IR THROMB F/U EVAL ART/VEN FINAL DAY (MS)  02/09/2019   IR US  GUIDE VASC ACCESS RIGHT  02/08/2019   MEDIAL PARTIAL KNEE REPLACEMENT Bilateral    MOHS SURGERY      x 2   REMOVAL OF STONES  06/15/2022   Procedure: REMOVAL OF STONES;  Surgeon: Rosalie Kitchens, MD;  [facility];  Service: Gastroenterology;;   RENAL BIOPSY     x 2   REPLACEMENT TOTAL KNEE  2012   Per [facility] New Patient Packet   ROTATOR CUFF REPAIR Left 2008   SPHINCTEROTOMY  06/15/2022   Procedure: SPHINCTEROTOMY;  Surgeon: Rosalie Kitchens, MD;  Location: [facility];  Service: Gastroenterology;;   Social History   Occupational History   Occupation: TEACHER     Employer: [employer]    Comment: RETIRED   Tobacco Use   Smoking status: Former    Current packs/day: 0.00    Average packs/day: 1.5 packs/day for 25.0 years (37.5 ttl pk-yrs)    Types: Cigarettes    Start date: 01/13/1948    Quit date: 01/12/1973    Years since quitting: 50.6   Smokeless  tobacco: Never  Vaping Use   Vaping status: Never Used  Substance and Sexual Activity   Alcohol  use: Yes    Alcohol /week: 0.5 standard drinks of alcohol     Types: 1 Standard drinks or equivalent per week    Comment: She occasionally drinks a glass of wine.     Drug use: No   Sexual activity: Not Currently    Partners: Male

## 2023-08-12 DIAGNOSIS — Z4789 Encounter for other orthopedic aftercare: Secondary | ICD-10-CM | POA: Diagnosis not present

## 2023-08-12 DIAGNOSIS — M25552 Pain in left hip: Secondary | ICD-10-CM | POA: Diagnosis not present

## 2023-08-16 ENCOUNTER — Ambulatory Visit: Payer: Medicare PPO | Admitting: Nurse Practitioner

## 2023-08-23 DIAGNOSIS — M25552 Pain in left hip: Secondary | ICD-10-CM | POA: Diagnosis not present

## 2023-08-23 DIAGNOSIS — Z4789 Encounter for other orthopedic aftercare: Secondary | ICD-10-CM | POA: Diagnosis not present

## 2023-08-24 ENCOUNTER — Ambulatory Visit: Admitting: Pulmonary Disease

## 2023-08-24 VITALS — BP 120/72 | HR 64

## 2023-08-24 DIAGNOSIS — Z86711 Personal history of pulmonary embolism: Secondary | ICD-10-CM | POA: Diagnosis not present

## 2023-08-24 DIAGNOSIS — J432 Centrilobular emphysema: Secondary | ICD-10-CM

## 2023-08-24 MED ORDER — AZELASTINE HCL 0.1 % NA SOLN: 2.0000 | Freq: Two times a day (BID) | NASAL | 3 refills | Status: DC

## 2023-08-24 MED ORDER — IPRATROPIUM BROMIDE 0.06 % NA SOLN: 2.0000 | Freq: Three times a day (TID) | NASAL | 12 refills | Status: AC

## 2023-08-24 NOTE — Patient Instructions (Signed)
 I will see you back in about 6 months  Prescription for Atrovent  nasal at a higher strength was sent to pharmacy for you to be used up to 3 times a day An alternate will be to use Astelin -prescription for this was sent to pharmacy for you as well  Try and use your albuterol  prior to going for your exercises  Graded activities as tolerated  Continue using your Trelegy  Call us  with significant concerns

## 2023-08-24 NOTE — Progress Notes (Signed)
 [name]    [id number]    [birth year]  Primary Care Physician:Eubanks, Harlene POUR, NP  Referring Physician: [name], NP [address] [place],  [place] [address]  Chief complaint:   Patient being seen for shortness of breath History of emphysema  HPI:  History of emphysema On Trelegy, albuterol   Hip fracture recently that she had IM nail for  Nasal sinus congestion, postnasal drip - On Flonase , Atrovent  nasal at 0.03 that she uses about once or twice a day, does not feel it is working as well We did talk about other options for management  Quit smoking about 49 years ago Dad did smoke significantly in the past  Past history of obstructive sleep apnea Use CPAP for few years but got to a point where it was preventing her from being able to get good sleep, stopped using it  Does get short of breath with moderate exertion Her daughter was present during the visit and said sometimes gets short of breath just with showers  She feels overall good today was last seen by Dr. Shellia in August 2024 History of PE in the past  History of hypertension, hyperlipidemia, hypothyroidism, GERD  Outpatient Encounter Medications as of 08/24/2023  Medication Sig   albuterol  (VENTOLIN  HFA) 108 (90 Base) MCG/ACT inhaler Inhale 1-2 puffs into the lungs every 6 (six) hours as needed for wheezing or shortness of breath.   apixaban  (ELIQUIS ) 5 MG TABS tablet TAKE 1 TABLET BY MOUTH TWICE A DAY   azelastine  (ASTELIN ) 0.1 % nasal spray Place 2 sprays into both nostrils 2 (two) times daily. Use in each nostril as directed   citalopram  (CELEXA ) 20 MG tablet TAKE 1 TABLET (20 MG TOTAL) BY MOUTH AT BEDTIME. DX F41.9   docusate sodium  (COLACE) 100 MG capsule Take 1 capsule (100 mg total) by mouth 2 (two) times daily.   donepezil  (ARICEPT ) 10 MG tablet TAKE 1 TABLET BY MOUTH EVERYDAY AT BEDTIME   ferrous sulfate  325 (65 FE) MG EC tablet TAKE 1 TABLET BY MOUTH EVERY DAY WITH  BREAKFAST   fluticasone  (FLONASE ) 50 MCG/ACT nasal spray SPRAY 1 SPRAY INTO EACH NOSTRIL DAILY   ipratropium (ATROVENT ) 0.06 % nasal spray Place 2 sprays into both nostrils 3 (three) times daily.   levothyroxine  (SYNTHROID ) 88 MCG tablet TAKE 1 TABLET BY MOUTH EVERY DAY IN THE MORNING   losartan  (COZAAR ) 100 MG tablet TAKE 1 TABLET BY MOUTH EVERY DAY   Melatonin 10 MG TABS Take 10 mg by mouth at bedtime.   metFORMIN  (GLUCOPHAGE ) 500 MG tablet Take 0.5 tablets (250 mg total) by mouth daily with supper.   metoprolol  tartrate (LOPRESSOR ) 25 MG tablet Take 1 tablet (25 mg total) by mouth 2 (two) times daily.   Multiple Vitamin (MULTIVITAMIN WITH MINERALS) TABS tablet Take 1 tablet by mouth daily with breakfast.   pantoprazole  (PROTONIX ) 40 MG tablet Take 1 tablet (40 mg total) by mouth at bedtime.   rOPINIRole  (REQUIP ) 1 MG tablet Take 1 tablet (1 mg total) by mouth 2 (two) times daily.   rosuvastatin  (CRESTOR ) 20 MG tablet TAKE 1 TABLET BY MOUTH EVERY DAY   tacrolimus  (PROGRAF ) 1 MG capsule Take 1 mg by mouth in the morning and at bedtime.   TRELEGY ELLIPTA  100-62.5-25 MCG/ACT AEPB INHALE 1 PUFF BY MOUTH EVERY DAY   TYLENOL  325 MG CAPS Take 325-650 mg by mouth every 6 (six) hours as needed (for pain or headaches).   [  DISCONTINUED] ipratropium (ATROVENT ) 0.03 % nasal spray SPRAY 2 SPRAYS INTO EACH NOSTRIL 3 TIMES A DAY AS NEEDED FOR RHINITIS   montelukast  (SINGULAIR ) 10 MG tablet TAKE 1 TABLET BY MOUTH EVERYDAY AT BEDTIME (Patient not taking: Reported on 08/24/2023)   traMADol  (ULTRAM ) 50 MG tablet TAKE 1 TABLET BY MOUTH EVERY 12 HOURS AS NEEDED. (Patient not taking: Reported on 08/24/2023)   Facility-Administered Encounter Medications as of 08/24/2023  Medication   denosumab  (PROLIA ) injection 60 mg    Allergies as of 08/24/2023   (No Known Allergies)    Past Medical History:  Diagnosis Date   Actinic keratosis    Arthritis    Asthma    Back injury 2019   Fracture   Basal cell carcinoma     COPD (chronic obstructive pulmonary disease) (HCC)    GERD (gastroesophageal reflux disease)    H/O blood clots 2020   Lung, Per [facility] New Patient Packet   H/O mammogram 2022   Per [facility] New Patient Packet   Heart murmur    hx of in childhood    High cholesterol    Hyperlipidemia    Hypertension    Hypothyroidism    Kidney disease    Per [facility] New Patient Packet   MCNS (minimal change nephrotic syndrome)    OSA (obstructive sleep apnea) 08/29/2019   Osteopenia    Shortness of breath dyspnea    on exertion    Squamous cell carcinoma    Thyroid  disease    Per [facility] New Patient Packet    Past Surgical History:  Procedure Laterality Date   ABDOMINAL HYSTERECTOMY     APPENDECTOMY     BILATERAL SALPINGOOPHORECTOMY     Ovarian Cysts    CHOLECYSTECTOMY N/A 06/17/2022   Procedure: LAPAROSCOPIC CHOLECYSTECTOMY;  Surgeon: Dasie Leonor CROME, MD;  Location: [facility];  Service: General;  Laterality: N/A;   COLONOSCOPY  2001   Dr.Mann, Per [facility] New Patient Packet   CONVERSION TO TOTAL KNEE Right 07/10/2014   Procedure: RIGHT CONVERSION OF PARTIAL KNEE TO TOTAL KNEE;  Surgeon: Donnice Car, MD;  Location: [facility];  Service: Orthopedics;  Laterality: Right;   ENDOSCOPIC RETROGRADE CHOLANGIOPANCREATOGRAPHY (ERCP) WITH PROPOFOL  N/A 06/15/2022   Procedure: ENDOSCOPIC RETROGRADE CHOLANGIOPANCREATOGRAPHY (ERCP) WITH PROPOFOL ;  Surgeon: Rosalie Kitchens, MD;  Location: [facility];  Service: Gastroenterology;  Laterality: N/A;   INTRAMEDULLARY (IM) NAIL INTERTROCHANTERIC Left 11/02/2022   Procedure: INTRAMEDULLARY NAILING OF LEFT FEMUR;  Surgeon: Jerri Kay HERO, MD;  Location: [facility];  Service: Orthopedics;  Laterality: Left;   IR ANGIOGRAM PULMONARY BILATERAL SELECTIVE  02/08/2019   IR ANGIOGRAM SELECTIVE EACH ADDITIONAL VESSEL  02/08/2019   IR ANGIOGRAM SELECTIVE EACH ADDITIONAL VESSEL  02/08/2019   IR INFUSION THROMBOL ARTERIAL INITIAL (MS)  02/08/2019   IR INFUSION THROMBOL ARTERIAL INITIAL (MS)  02/08/2019   IR  THROMB F/U EVAL ART/VEN FINAL DAY (MS)  02/09/2019   IR US  GUIDE VASC ACCESS RIGHT  02/08/2019   MEDIAL PARTIAL KNEE REPLACEMENT Bilateral    MOHS SURGERY     x 2   REMOVAL OF STONES  06/15/2022   Procedure: REMOVAL OF STONES;  Surgeon: Rosalie Kitchens, MD;  Location: [facility];  Service: Gastroenterology;;   RENAL BIOPSY     x 2   REPLACEMENT TOTAL KNEE  2012   Per [facility] New Patient Packet   ROTATOR CUFF REPAIR Left 2008   SPHINCTEROTOMY  06/15/2022   Procedure: SPHINCTEROTOMY;  Surgeon: Rosalie Kitchens, MD;  Location: [facility];  Service:  Gastroenterology;;    Family History  Problem Relation Age of Onset   COPD Mother        Husband Smoked    Lymphoma Father    Hypertension Sister    Scoliosis Daughter    Stroke Maternal Grandfather    Rheumatic fever Paternal Grandmother    Heart disease Paternal Grandfather     Social History   Socioeconomic History   Marital status: Widowed    Spouse name: Not on file   Number of children: 2   Years of education: 16   Highest education level: Not on file  Occupational History   Occupation: TEACHER     Employer: [employer]    Comment: RETIRED   Tobacco Use   Smoking status: Former    Current packs/day: 0.00    Average packs/day: 1.5 packs/day for 25.0 years (37.5 ttl pk-yrs)    Types: Cigarettes    Start date: 01/13/1948    Quit date: 01/12/1973    Years since quitting: 50.6   Smokeless tobacco: Never  Vaping Use   Vaping status: Never Used  Substance and Sexual Activity   Alcohol  use: Yes    Alcohol /week: 0.5 standard drinks of alcohol     Types: 1 Standard drinks or equivalent per week    Comment: She occasionally drinks a glass of wine.     Drug use: No   Sexual activity: Not Currently    Partners: Male  Other Topics Concern   Not on file  Social History Narrative   Marital Status:  Widowed   Siblings: Kitty Simmons/ Rachel RuthChildren:  2;  Grandchildren:  5 Pets: NoneLiving Situation: Lives aloneOccupation:  Retired Public affairs consultant: Engineer, maintenance (IT) Tobacco Use/Exposure:  She quit smoking > 20 years ago after having smoked 1 1/2 ppd for over 20 years.  Alcohol  Use:  Occasional (Wine) Drug Use:  NoneDiet:  RegularExercise:  Water Aerobic Class 3 days a week and WeightsHobbies: Reading/ Crafts/ Biking/ Reading]      Per [facility] New patient packet abstracted on 11/01/2020   Diet: Left blank      Caffeine: Yes      Married, if yes what year: Widow, married in 1961      Do you live in a house, apartment, assisted living, condo, trailer, ect: Patient did not answer correctly (lived alone until 10/1/20222)      Is it one or more stories: 2      How many persons live in your home? 2      Pets: cat      Highest level or education completed: 1 year of college       Current/Past profession: Geologist, engineering       Exercise:      No            Type and how often:          Living Will: Yes   DNR: Yes   POA/HPOA: Yes      Functional Status:   Do you have difficulty bathing or dressing yourself?  Left blank   Do you have difficulty preparing food or eating?Left blank   Do you have difficulty managing your medications?Left blank   Do you have difficulty managing your finances?Left blank   Do you have difficulty affording your medications?Left blank   Social Drivers of Health   Financial Resource Strain: Not on file  Food Insecurity: No Food Insecurity (06/07/2023)   Hunger Vital Sign    Worried About Running Out of  Food in the Last Year: Never true    Ran Out of Food in the Last Year: Never true  Transportation Needs: No Transportation Needs (06/07/2023)   PRAPARE - Administrator, Civil Service (Medical): No    Lack of Transportation (Non-Medical): No  Physical Activity: Not on file  Stress: Not on file  Social Connections: Not on file  Intimate Partner Violence: Not At Risk (06/13/2022)   Humiliation, Afraid, Rape, and Kick questionnaire    Fear of Current or Ex-Partner: No     Emotionally Abused: No    Physically Abused: No    Sexually Abused: No    Review of Systems  Respiratory:  Positive for shortness of breath.     Vitals:   08/24/23 1455 08/24/23 1457  BP:  120/72  Pulse: 64 64  SpO2: 93% 94%     Physical Exam Constitutional:      Appearance: She is obese.  HENT:     Head: Normocephalic.     Mouth/Throat:     Mouth: Mucous membranes are moist.  Eyes:     General: No scleral icterus. Cardiovascular:     Rate and Rhythm: Normal rate and regular rhythm.     Heart sounds: No murmur heard.    No friction rub.  Pulmonary:     Effort: No respiratory distress.     Breath sounds: No stridor. No wheezing or rhonchi.  Musculoskeletal:     Cervical back: No rigidity or tenderness.  Neurological:     Mental Status: She is alert.  Psychiatric:        Mood and Affect: Mood normal.    Data Reviewed: PFT from 06/13/2019 reveals no significant obstructive disease, mild restriction with no significant bronchodilator response CT scan 06/13/2022 reviewed showing evidence of emphysema  Echocardiogram 06/10/2023 with ejection fraction of 55%, grade 1 diastolic dysfunction normal right ventricular size, no mention of pulmonary hypertension  Assessment:  Chronic obstructive pulmonary disease/emphysema  Restrictive lung disease  Shortness of breath on exertion  Musculoskeletal pain/dysfunction contributing to deconditioning  Postnasal drip  Obstructive sleep apnea, -Became intolerant to CPAP  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  Plan/Recommendations: Continue Trelegy  Prescription for Atrovent  nasal at 0.06 to be used 3 times daily  Continue Trelegy  Albuterol  use prior to exercise  Encouraged gradual increase in graded activities  Encouraged to call with significant concerns  Follow-up in about 6 months     Jennet Epley MD [facility] 08/24/2023, 3:40 PM  CC: [name], NP

## 2023-08-26 DIAGNOSIS — M2041 Other hammer toe(s) (acquired), right foot: Secondary | ICD-10-CM | POA: Diagnosis not present

## 2023-08-26 DIAGNOSIS — M19072 Primary osteoarthritis, left ankle and foot: Secondary | ICD-10-CM | POA: Diagnosis not present

## 2023-08-26 DIAGNOSIS — B351 Tinea unguium: Secondary | ICD-10-CM | POA: Diagnosis not present

## 2023-08-26 DIAGNOSIS — R262 Difficulty in walking, not elsewhere classified: Secondary | ICD-10-CM | POA: Diagnosis not present

## 2023-08-26 DIAGNOSIS — M19071 Primary osteoarthritis, right ankle and foot: Secondary | ICD-10-CM | POA: Diagnosis not present

## 2023-08-26 DIAGNOSIS — M2042 Other hammer toe(s) (acquired), left foot: Secondary | ICD-10-CM | POA: Diagnosis not present

## 2023-08-26 DIAGNOSIS — L603 Nail dystrophy: Secondary | ICD-10-CM | POA: Diagnosis not present

## 2023-08-26 DIAGNOSIS — L600 Ingrowing nail: Secondary | ICD-10-CM | POA: Diagnosis not present

## 2023-08-26 DIAGNOSIS — E1151 Type 2 diabetes mellitus with diabetic peripheral angiopathy without gangrene: Secondary | ICD-10-CM | POA: Diagnosis not present

## 2023-09-02 DIAGNOSIS — M25552 Pain in left hip: Secondary | ICD-10-CM | POA: Diagnosis not present

## 2023-09-02 DIAGNOSIS — Z4789 Encounter for other orthopedic aftercare: Secondary | ICD-10-CM | POA: Diagnosis not present

## 2023-09-05 ENCOUNTER — Other Ambulatory Visit: Payer: Self-pay | Admitting: Nurse Practitioner

## 2023-09-05 DIAGNOSIS — G2581 Restless legs syndrome: Secondary | ICD-10-CM

## 2023-09-07 DIAGNOSIS — M25552 Pain in left hip: Secondary | ICD-10-CM | POA: Diagnosis not present

## 2023-09-07 DIAGNOSIS — Z4789 Encounter for other orthopedic aftercare: Secondary | ICD-10-CM | POA: Diagnosis not present

## 2023-09-09 DIAGNOSIS — M19071 Primary osteoarthritis, right ankle and foot: Secondary | ICD-10-CM | POA: Diagnosis not present

## 2023-09-09 DIAGNOSIS — B351 Tinea unguium: Secondary | ICD-10-CM | POA: Diagnosis not present

## 2023-09-09 DIAGNOSIS — E1151 Type 2 diabetes mellitus with diabetic peripheral angiopathy without gangrene: Secondary | ICD-10-CM | POA: Diagnosis not present

## 2023-09-09 DIAGNOSIS — M19072 Primary osteoarthritis, left ankle and foot: Secondary | ICD-10-CM | POA: Diagnosis not present

## 2023-09-09 DIAGNOSIS — L600 Ingrowing nail: Secondary | ICD-10-CM | POA: Diagnosis not present

## 2023-09-09 DIAGNOSIS — L603 Nail dystrophy: Secondary | ICD-10-CM | POA: Diagnosis not present

## 2023-09-09 DIAGNOSIS — M2042 Other hammer toe(s) (acquired), left foot: Secondary | ICD-10-CM | POA: Diagnosis not present

## 2023-09-09 DIAGNOSIS — M2041 Other hammer toe(s) (acquired), right foot: Secondary | ICD-10-CM | POA: Diagnosis not present

## 2023-09-09 DIAGNOSIS — R262 Difficulty in walking, not elsewhere classified: Secondary | ICD-10-CM | POA: Diagnosis not present

## 2023-09-10 DIAGNOSIS — Z4789 Encounter for other orthopedic aftercare: Secondary | ICD-10-CM | POA: Diagnosis not present

## 2023-09-10 DIAGNOSIS — M25552 Pain in left hip: Secondary | ICD-10-CM | POA: Diagnosis not present

## 2023-09-14 DIAGNOSIS — Z1231 Encounter for screening mammogram for malignant neoplasm of breast: Secondary | ICD-10-CM | POA: Diagnosis not present

## 2023-09-14 DIAGNOSIS — M810 Age-related osteoporosis without current pathological fracture: Secondary | ICD-10-CM | POA: Diagnosis not present

## 2023-09-14 LAB — HM MAMMOGRAPHY

## 2023-09-14 LAB — HM DEXA SCAN

## 2023-09-16 ENCOUNTER — Encounter: Payer: Self-pay | Admitting: Nurse Practitioner

## 2023-09-16 DIAGNOSIS — Z4789 Encounter for other orthopedic aftercare: Secondary | ICD-10-CM | POA: Diagnosis not present

## 2023-09-16 DIAGNOSIS — M25552 Pain in left hip: Secondary | ICD-10-CM | POA: Diagnosis not present

## 2023-09-17 ENCOUNTER — Ambulatory Visit: Admitting: Nurse Practitioner

## 2023-09-20 ENCOUNTER — Ambulatory Visit: Payer: Self-pay | Admitting: Nurse Practitioner

## 2023-09-21 DIAGNOSIS — M25552 Pain in left hip: Secondary | ICD-10-CM | POA: Diagnosis not present

## 2023-09-21 DIAGNOSIS — Z4789 Encounter for other orthopedic aftercare: Secondary | ICD-10-CM | POA: Diagnosis not present

## 2023-09-23 DIAGNOSIS — Z4789 Encounter for other orthopedic aftercare: Secondary | ICD-10-CM | POA: Diagnosis not present

## 2023-09-23 DIAGNOSIS — M25552 Pain in left hip: Secondary | ICD-10-CM | POA: Diagnosis not present

## 2023-09-28 DIAGNOSIS — Z4789 Encounter for other orthopedic aftercare: Secondary | ICD-10-CM | POA: Diagnosis not present

## 2023-09-28 DIAGNOSIS — M25552 Pain in left hip: Secondary | ICD-10-CM | POA: Diagnosis not present

## 2023-09-29 ENCOUNTER — Other Ambulatory Visit: Payer: Self-pay | Admitting: Nurse Practitioner

## 2023-09-29 ENCOUNTER — Other Ambulatory Visit: Payer: Self-pay | Admitting: Pulmonary Disease

## 2023-09-29 DIAGNOSIS — G2581 Restless legs syndrome: Secondary | ICD-10-CM

## 2023-09-29 DIAGNOSIS — E039 Hypothyroidism, unspecified: Secondary | ICD-10-CM

## 2023-10-01 DIAGNOSIS — E119 Type 2 diabetes mellitus without complications: Secondary | ICD-10-CM | POA: Diagnosis not present

## 2023-10-01 LAB — HM DIABETES EYE EXAM

## 2023-10-05 DIAGNOSIS — M25552 Pain in left hip: Secondary | ICD-10-CM | POA: Diagnosis not present

## 2023-10-05 DIAGNOSIS — Z4789 Encounter for other orthopedic aftercare: Secondary | ICD-10-CM | POA: Diagnosis not present

## 2023-10-12 DIAGNOSIS — Z4789 Encounter for other orthopedic aftercare: Secondary | ICD-10-CM | POA: Diagnosis not present

## 2023-10-12 DIAGNOSIS — M25552 Pain in left hip: Secondary | ICD-10-CM | POA: Diagnosis not present

## 2023-10-14 DIAGNOSIS — B078 Other viral warts: Secondary | ICD-10-CM | POA: Diagnosis not present

## 2023-10-14 DIAGNOSIS — L570 Actinic keratosis: Secondary | ICD-10-CM | POA: Diagnosis not present

## 2023-10-14 DIAGNOSIS — D485 Neoplasm of uncertain behavior of skin: Secondary | ICD-10-CM | POA: Diagnosis not present

## 2023-10-14 DIAGNOSIS — C44612 Basal cell carcinoma of skin of right upper limb, including shoulder: Secondary | ICD-10-CM | POA: Diagnosis not present

## 2023-10-14 DIAGNOSIS — L820 Inflamed seborrheic keratosis: Secondary | ICD-10-CM | POA: Diagnosis not present

## 2023-10-19 DIAGNOSIS — Z4789 Encounter for other orthopedic aftercare: Secondary | ICD-10-CM | POA: Diagnosis not present

## 2023-10-19 DIAGNOSIS — M25552 Pain in left hip: Secondary | ICD-10-CM | POA: Diagnosis not present

## 2023-10-25 ENCOUNTER — Ambulatory Visit: Admitting: Nurse Practitioner

## 2023-10-25 ENCOUNTER — Encounter: Payer: Self-pay | Admitting: Nurse Practitioner

## 2023-10-25 VITALS — BP 128/72 | HR 60 | Temp 97.2°F | Ht 66.0 in | Wt 229.2 lb

## 2023-10-25 DIAGNOSIS — F419 Anxiety disorder, unspecified: Secondary | ICD-10-CM

## 2023-10-25 DIAGNOSIS — I2782 Chronic pulmonary embolism: Secondary | ICD-10-CM

## 2023-10-25 DIAGNOSIS — J449 Chronic obstructive pulmonary disease, unspecified: Secondary | ICD-10-CM

## 2023-10-25 DIAGNOSIS — G3184 Mild cognitive impairment, so stated: Secondary | ICD-10-CM

## 2023-10-25 DIAGNOSIS — N1832 Chronic kidney disease, stage 3b: Secondary | ICD-10-CM | POA: Diagnosis not present

## 2023-10-25 DIAGNOSIS — Z23 Encounter for immunization: Secondary | ICD-10-CM | POA: Diagnosis not present

## 2023-10-25 DIAGNOSIS — E039 Hypothyroidism, unspecified: Secondary | ICD-10-CM | POA: Diagnosis not present

## 2023-10-25 DIAGNOSIS — D508 Other iron deficiency anemias: Secondary | ICD-10-CM

## 2023-10-25 DIAGNOSIS — N1831 Chronic kidney disease, stage 3a: Secondary | ICD-10-CM

## 2023-10-25 DIAGNOSIS — I2609 Other pulmonary embolism with acute cor pulmonale: Secondary | ICD-10-CM

## 2023-10-25 DIAGNOSIS — E1122 Type 2 diabetes mellitus with diabetic chronic kidney disease: Secondary | ICD-10-CM

## 2023-10-25 MED ORDER — COVID-19 MRNA VACCINE (PFIZER) 30 MCG/0.3ML IM SUSP: 0.3000 mL | Freq: Once | INTRAMUSCULAR | 0 refills | Status: AC

## 2023-10-25 MED ORDER — METFORMIN HCL 500 MG PO TABS: 500.0000 mg | ORAL_TABLET | Freq: Every day | ORAL | 1 refills | Status: AC

## 2023-10-25 MED ORDER — ALBUTEROL SULFATE HFA 108 (90 BASE) MCG/ACT IN AERS: 1.0000 | INHALATION_SPRAY | Freq: Four times a day (QID) | RESPIRATORY_TRACT | 2 refills | Status: AC | PRN

## 2023-10-25 NOTE — Patient Instructions (Signed)
 Change PROLIA  appt to in office follow up Labs prior to appt.

## 2023-10-25 NOTE — Progress Notes (Unsigned)
 Careteam: Patient Care Team: [name], NP as PCP - General (Geriatric Medicine) [name], [name], MD as PCP - Cardiology (Cardiology) Ernie Cough, MD as Consulting Physician (Orthopedic Surgery) Robinson Pao, MD as Consulting Physician (Dermatology) Fleurette Brow, MD as Referring Physician (Nephrology) Shellia Oh, MD as Consulting Physician (Pulmonary Disease) Abigail Maude POUR (Optometry)  PLACE OF SERVICE:  [facility]  Advanced Directive information Does Patient Have a Medical Advance Directive?: Yes, Type of Advance Directive: Healthcare Power of [place];Living will, Does patient want to make changes to medical advance directive?: No - Patient declined  No Known Allergies  Chief Complaint  Patient presents with   Medical Management of Chronic Issues    Medical Management of Chronic Issues. 6 Month follow up. To discuss need for Diabetic Kidney Eval, Foot and Tdap. Refused Covid. Flu Given.     HPI:  Discussed the use of AI scribe software for clinical note transcription with the patient, who gave verbal consent to proceed.  History of Present Illness [name] is an 83 year old female with COPD and diabetes who presents for a routine follow-up visit. She is accompanied by her daughter.  She reports feeling good overall with no new concerns. She uses a cane for walking and drives occasionally, though her daughter is cautious about her driving.  She has a history of COPD with occasional exertional dyspnea that improves with rest. She uses Trelegy Ellipta  one puff daily and rarely uses her albuterol  inhaler. There have been no recent respiratory infections or significant changes in her COPD symptoms since discontinuing Singulair .  Her recent A1c was 6.3. She takes metformin  500 mg daily.  She is on Synthroid  for thyroid  management.   She takes Protonix  daily at bedtime for acid reflux, with no worsening of symptoms.  She has a history of pulmonary  embolism and is on Eliquis  for anticoagulation. She also has atrial tachycardia and takes metoprolol  25 mg twice daily and losartan  100 mg for blood pressure management.  She takes an iron supplement due to previous low levels after a fracture a year ago, with no issues of constipation.  She is on Celexa  for mood stabilization and reports feeling good.   She also takes medication for restless leg syndrome, noting that timing of doses affects symptom control.      Review of Systems:  Review of Systems  Constitutional:  Negative for chills, fever and weight loss.  HENT:  Negative for tinnitus.   Respiratory:  Negative for cough, sputum production and shortness of breath.   Cardiovascular:  Negative for chest pain, palpitations and leg swelling.  Gastrointestinal:  Negative for abdominal pain, constipation, diarrhea and heartburn.  Genitourinary:  Negative for dysuria, frequency and urgency.  Musculoskeletal:  Negative for back pain, falls, joint pain and myalgias.  Skin: Negative.   Neurological:  Negative for dizziness and headaches.  Psychiatric/Behavioral:  Negative for depression and memory loss. The patient does not have insomnia.     Past Medical History:  Diagnosis Date   Actinic keratosis    Arthritis    Asthma    Back injury 2019   Fracture   Basal cell carcinoma    COPD (chronic obstructive pulmonary disease) (HCC)    GERD (gastroesophageal reflux disease)    H/O blood clots 2020   Lung, Per [facility] New Patient Packet   H/O mammogram 2022   Per [facility] New Patient Packet   Heart murmur    hx of in childhood  High cholesterol    Hyperlipidemia    Hypertension    Hypothyroidism    Kidney disease    Per [facility] New Patient Packet   MCNS (minimal change nephrotic syndrome)    OSA (obstructive sleep apnea) 08/29/2019   Osteopenia    Shortness of breath dyspnea    on exertion    Squamous cell carcinoma    Thyroid  disease    Per [facility] New Patient Packet   Past Surgical  History:  Procedure Laterality Date   ABDOMINAL HYSTERECTOMY     APPENDECTOMY     BILATERAL SALPINGOOPHORECTOMY     Ovarian Cysts    CHOLECYSTECTOMY N/A 06/17/2022   Procedure: LAPAROSCOPIC CHOLECYSTECTOMY;  Surgeon: Dasie Leonor CROME, MD;  Location: [facility];  Service: General;  Laterality: N/A;   COLONOSCOPY  2001   Dr.Mann, Per [facility] New Patient Packet   CONVERSION TO TOTAL KNEE Right 07/10/2014   Procedure: RIGHT CONVERSION OF PARTIAL KNEE TO TOTAL KNEE;  Surgeon: Donnice Car, MD;  Location: [facility];  Service: Orthopedics;  Laterality: Right;   ENDOSCOPIC RETROGRADE CHOLANGIOPANCREATOGRAPHY (ERCP) WITH PROPOFOL  N/A 06/15/2022   Procedure: ENDOSCOPIC RETROGRADE CHOLANGIOPANCREATOGRAPHY (ERCP) WITH PROPOFOL ;  Surgeon: Rosalie Kitchens, MD;  Location: [facility];  Service: Gastroenterology;  Laterality: N/A;   INTRAMEDULLARY (IM) NAIL INTERTROCHANTERIC Left 11/02/2022   Procedure: INTRAMEDULLARY NAILING OF LEFT FEMUR;  Surgeon: Jerri Kay HERO, MD;  Location: [facility] OR;  Service: Orthopedics;  Laterality: Left;   IR ANGIOGRAM PULMONARY BILATERAL SELECTIVE  02/08/2019   IR ANGIOGRAM SELECTIVE EACH ADDITIONAL VESSEL  02/08/2019   IR ANGIOGRAM SELECTIVE EACH ADDITIONAL VESSEL  02/08/2019   IR INFUSION THROMBOL ARTERIAL INITIAL (MS)  02/08/2019   IR INFUSION THROMBOL ARTERIAL INITIAL (MS)  02/08/2019   IR THROMB F/U EVAL ART/VEN FINAL DAY (MS)  02/09/2019   IR US  GUIDE VASC ACCESS RIGHT  02/08/2019   MEDIAL PARTIAL KNEE REPLACEMENT Bilateral    MOHS SURGERY     x 2   REMOVAL OF STONES  06/15/2022   Procedure: REMOVAL OF STONES;  Surgeon: Rosalie Kitchens, MD;  Location: [facility];  Service: Gastroenterology;;   RENAL BIOPSY     x 2   REPLACEMENT TOTAL KNEE  2012   Per [facility] New Patient Packet   ROTATOR CUFF REPAIR Left 2008   SPHINCTEROTOMY  06/15/2022   Procedure: SPHINCTEROTOMY;  Surgeon: Rosalie Kitchens, MD;  Location: [facility];  Service: Gastroenterology;;   Social History:   reports that she quit smoking  about 50 years ago. Her smoking use included cigarettes. She started smoking about 75 years ago. She has a 37.5 pack-year smoking history. She has never used smokeless tobacco. She reports current alcohol  use of about 0.5 standard drinks of alcohol  per week. She reports that she does not use drugs.  Family History  Problem Relation Age of Onset   COPD Mother        Husband Smoked    Lymphoma Father    Hypertension Sister    Scoliosis Daughter    Stroke Maternal Grandfather    Rheumatic fever Paternal Grandmother    Heart disease Paternal Grandfather     Medications: Patient's Medications  New Prescriptions   No medications on file  Previous Medications   ALBUTEROL  (VENTOLIN  HFA) 108 (90 BASE) MCG/ACT INHALER    Inhale 1-2 puffs into the lungs every 6 (six) hours as needed for wheezing or shortness of breath.   APIXABAN  (ELIQUIS ) 5 MG TABS TABLET    TAKE 1 TABLET BY MOUTH TWICE  A DAY   AZELASTINE  HCL 137 MCG/SPRAY SOLN    PLACE 2 SPRAYS INTO BOTH NOSTRILS 2 (TWO) TIMES DAILY. USE IN EACH NOSTRIL AS DIRECTED   CITALOPRAM  (CELEXA ) 20 MG TABLET    TAKE 1 TABLET (20 MG TOTAL) BY MOUTH AT BEDTIME. DX F41.9   DOCUSATE SODIUM  (COLACE) 100 MG CAPSULE    Take 1 capsule (100 mg total) by mouth 2 (two) times daily.   DONEPEZIL  (ARICEPT ) 10 MG TABLET    TAKE 1 TABLET BY MOUTH EVERYDAY AT BEDTIME   FERROUS SULFATE  325 (65 FE) MG EC TABLET    TAKE 1 TABLET BY MOUTH EVERY DAY WITH BREAKFAST   FLUTICASONE  (FLONASE ) 50 MCG/ACT NASAL SPRAY    SPRAY 1 SPRAY INTO EACH NOSTRIL DAILY   IPRATROPIUM (ATROVENT ) 0.06 % NASAL SPRAY    Place 2 sprays into both nostrils 3 (three) times daily.   LEVOTHYROXINE  (SYNTHROID ) 88 MCG TABLET    TAKE 1 TABLET BY MOUTH EVERY DAY IN THE MORNING   LOSARTAN  (COZAAR ) 100 MG TABLET    TAKE 1 TABLET BY MOUTH EVERY DAY   MELATONIN 10 MG TABS    Take 10 mg by mouth at bedtime.   METFORMIN  (GLUCOPHAGE ) 500 MG TABLET    Take 0.5 tablets (250 mg total) by mouth daily with supper.    METOPROLOL  TARTRATE (LOPRESSOR ) 25 MG TABLET    Take 1 tablet (25 mg total) by mouth 2 (two) times daily.   MONTELUKAST  (SINGULAIR ) 10 MG TABLET    TAKE 1 TABLET BY MOUTH EVERYDAY AT BEDTIME   MULTIPLE VITAMIN (MULTIVITAMIN WITH MINERALS) TABS TABLET    Take 1 tablet by mouth daily with breakfast.   PANTOPRAZOLE  (PROTONIX ) 40 MG TABLET    Take 1 tablet (40 mg total) by mouth at bedtime.   ROPINIROLE  (REQUIP ) 1 MG TABLET    TAKE 1 TABLET BY MOUTH EVERYDAY AT BEDTIME   ROSUVASTATIN  (CRESTOR ) 20 MG TABLET    TAKE 1 TABLET BY MOUTH EVERY DAY   TACROLIMUS  (PROGRAF ) 1 MG CAPSULE    Take 1 mg by mouth in the morning and at bedtime.   TRAMADOL  (ULTRAM ) 50 MG TABLET    TAKE 1 TABLET BY MOUTH EVERY 12 HOURS AS NEEDED.   TRELEGY ELLIPTA  100-62.5-25 MCG/ACT AEPB    INHALE 1 PUFF BY MOUTH EVERY DAY   TYLENOL  325 MG CAPS    Take 325-650 mg by mouth every 6 (six) hours as needed (for pain or headaches).  Modified Medications   No medications on file  Discontinued Medications   No medications on file    Physical Exam:  Vitals:   10/25/23 1125  BP: 128/72  Pulse: 60  Temp: (!) 97.2 F (36.2 C)  SpO2: 94%  Weight: 229 lb 3.2 oz (104 kg)  Height: 5' 6 (1.676 m)   Body mass index is 36.99 kg/m. Wt Readings from Last 3 Encounters:  10/25/23 229 lb 3.2 oz (104 kg)  06/14/23 217 lb 6.4 oz (98.6 kg)  04/26/23 212 lb (96.2 kg)    Physical Exam Constitutional:      General: She is not in acute distress.    Appearance: She is well-developed. She is not diaphoretic.  HENT:     Head: Normocephalic and atraumatic.     Mouth/Throat:     Pharynx: No oropharyngeal exudate.  Eyes:     Conjunctiva/sclera: Conjunctivae normal.     Pupils: Pupils are equal, round, and reactive to light.  Cardiovascular:  Rate and Rhythm: Normal rate and regular rhythm.     Heart sounds: Normal heart sounds.  Pulmonary:     Effort: Pulmonary effort is normal.     Breath sounds: Normal breath sounds.  Abdominal:      General: Bowel sounds are normal.     Palpations: Abdomen is soft.  Musculoskeletal:     Cervical back: Normal range of motion and neck supple.     Right lower leg: No edema.     Left lower leg: No edema.  Skin:    General: Skin is warm and dry.  Neurological:     Mental Status: She is alert.  Psychiatric:        Mood and Affect: Mood normal.     Labs reviewed: Basic Metabolic Panel: Recent Labs    11/04/22 0305 11/05/22 0417 11/06/22 1112 12/23/22 1501 06/17/23 1105  NA 136 138 141 141 141  K 4.7 4.9 4.6 4.9 4.1  CL 102 104 104 103 104  CO2 27 26 28 28 30   GLUCOSE 133* 144* 182* 99 120*  BUN 47* 50* 40* 23 20  CREATININE 1.42* 1.31* 1.02* 1.19* 0.96*  CALCIUM  8.3* 8.2* 8.6* 9.7 9.4  MG 2.3 2.2 2.1  --   --   PHOS  --  3.8 3.8  --   --   TSH  --   --   --  2.91  --    Liver Function Tests: Recent Labs    11/05/22 0417 06/17/23 1105  AST 24 13  ALT 12 10  ALKPHOS 34*  --   BILITOT 0.6 0.5  PROT 5.7* 7.1  ALBUMIN 2.3*  --    No results for input(s): LIPASE, AMYLASE in the last 8760 hours. No results for input(s): AMMONIA in the last 8760 hours. CBC: Recent Labs    11/03/22 0433 11/04/22 0305 11/05/22 0417 11/05/22 0854 11/05/22 1711 12/23/22 1501 06/17/23 1105  WBC 11.5*   < > 9.7  --   --  10.0 7.1  NEUTROABS 9.1*  --   --   --   --  6,850 4,452  HGB 9.3*   < > 7.4*   < > 8.4* 12.8 12.7  HCT 30.2*   < > 24.2*   < > 27.0* 40.6 40.5  MCV 93.2   < > 92.0  --   --  94.0 88.4  PLT 194   < > 229  --   --  319 255   < > = values in this interval not displayed.   Lipid Panel: Recent Labs    12/23/22 1501  CHOL 112  HDL 52  LDLCALC 33  TRIG 204*  CHOLHDL 2.2   TSH: Recent Labs    12/23/22 1501  TSH 2.91   A1C: Lab Results  Component Value Date   HGBA1C 6.3 (H) 06/17/2023     Assessment/Plan  Chronic obstructive pulmonary disease, unspecified COPD type (HCC) Assessment & Plan: Well controlled on trelegy and  singulair  Continues on albuterol  PRN.   Orders: -     Albuterol  Sulfate HFA; Inhale 1-2 puffs into the lungs every 6 (six) hours as needed for wheezing or shortness of breath.  Dispense: 6.7 g; Refill: 2  Type 2 diabetes mellitus with stage 3b chronic kidney disease, without long-term current use of insulin  (HCC) Assessment & Plan: Encouraged dietary compliance, routine foot care/monitoring and to keep up with diabetic eye exams through ophthalmology. And to continue current medication regimen.   Orders: -  metFORMIN  HCl; Take 1 tablet (500 mg total) by mouth daily with supper.  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    Iron deficiency anemia secondary to inadequate dietary iron intake Assessment & Plan: Hgb at goal on last labs, continues on iron supplement.   Orders: -     CBC with Differential/Platelet; Future  Anxiety Assessment & Plan: Continues on celeca with good effect.  Continue medication with lifestyle modifications.   Orders: -     Comprehensive metabolic panel with GFR; Future  Other orders -     Flu vaccine HIGH DOSE PF(Fluzone Trivalent) -     COVID-19 mRNA Vaccine Proofreader); Inject 0.3 mLs into the muscle once for 1 dose.  Dispense: 0.3 mL; Refill: 0     Follow up with next prolia  and labs prior to  appt.   Moritz Lever K. Caro BODILY [facility] [phone]

## 2023-10-27 DIAGNOSIS — G3184 Mild cognitive impairment, so stated: Secondary | ICD-10-CM | POA: Insufficient documentation

## 2023-10-27 DIAGNOSIS — D508 Other iron deficiency anemias: Secondary | ICD-10-CM | POA: Insufficient documentation

## 2023-10-27 NOTE — Assessment & Plan Note (Signed)
 Continues on eliquis  BID

## 2023-10-27 NOTE — Assessment & Plan Note (Signed)
 Hgb at goal on last labs, continues on iron supplement.

## 2023-10-27 NOTE — Assessment & Plan Note (Signed)
 Continues on celeca with good effect.  Continue medication with lifestyle modifications.

## 2023-10-27 NOTE — Assessment & Plan Note (Signed)
 Maintained on synthroid   88 mcg, tsh at goal on last labs

## 2023-10-27 NOTE — Assessment & Plan Note (Signed)
 Encouraged dietary compliance, routine foot care/monitoring and to keep up with diabetic eye exams through ophthalmology. And to continue current medication regimen.

## 2023-10-27 NOTE — Assessment & Plan Note (Signed)
 Stable, no acute changes in cognitive or functional status, continue supportive care.

## 2023-10-27 NOTE — Assessment & Plan Note (Signed)
 Well controlled on trelegy and singulair  Continues on albuterol  PRN.

## 2023-10-29 ENCOUNTER — Other Ambulatory Visit: Payer: Self-pay | Admitting: Nurse Practitioner

## 2023-10-29 DIAGNOSIS — G2581 Restless legs syndrome: Secondary | ICD-10-CM

## 2023-10-29 MED ORDER — ROPINIROLE HCL 1 MG PO TABS: 1.0000 mg | ORAL_TABLET | Freq: Every day | ORAL | 3 refills | Status: AC

## 2023-10-29 NOTE — Telephone Encounter (Signed)
 Copied from CRM [id number]. Topic: Clinical - Medication Refill >> Oct 29, 2023 11:02 AM Carrielelia G wrote: Medication: rOPINIRole  (REQUIP ) 1 MG tablet  Has the patient contacted their pharmacy? Yes (Agent: If no, request that the patient contact the pharmacy for the refill. If patient does not wish to contact the pharmacy document the reason why and proceed with request.) (Agent: If yes, when and what did the pharmacy advise?)  This is the patient's preferred pharmacy:  [id number] [place] [place] - [address] [place] [address] [place] [address] [place] [address] Phone: [phone] Fax: [phone]  Is this the correct pharmacy for this prescription? Yes If no, delete pharmacy and type the correct one.    Is the patient out of the medication? No (1 pill left)   Has the patient been seen for an appointment in the last year OR does the patient have an upcoming appointment? Yes  Can we respond through MyChart? no  Agent: Please be advised that Rx refills may take up to 3 business days. We ask that you follow-up with your pharmacy.

## 2023-11-03 ENCOUNTER — Other Ambulatory Visit: Payer: Self-pay | Admitting: Pulmonary Disease

## 2023-11-03 DIAGNOSIS — J449 Chronic obstructive pulmonary disease, unspecified: Secondary | ICD-10-CM

## 2023-11-03 MED ORDER — TRELEGY ELLIPTA 100-62.5-25 MCG/ACT IN AEPB: 1.0000 | INHALATION_SPRAY | Freq: Every day | RESPIRATORY_TRACT | 11 refills | Status: AC

## 2023-11-03 NOTE — Telephone Encounter (Signed)
 Copied from CRM #[id number]. Topic: Clinical - Medication Refill >> Nov 03, 2023 11:33 AM Isabell A wrote: Medication: TRELEGY ELLIPTA  100-62.5-25 MCG/ACT AEPB  Has the patient contacted their pharmacy? Yes (Agent: If no, request that the patient contact the pharmacy for the refill. If patient does not wish to contact the pharmacy document the reason why and proceed with request.) (Agent: If yes, when and what did the pharmacy advise?)  This is the patient's preferred pharmacy:  [id number] - [place], [place] - [address] [place] [place] [address] [place] [address] Phone: [phone] Fax: [phone]  Is this the correct pharmacy for this prescription? Yes If no, delete pharmacy and type the correct one.   Has the prescription been filled recently? Yes  Is the patient out of the medication? Yes  Has the patient been seen for an appointment in the last year OR does the patient have an upcoming appointment? No  Can we respond through MyChart? No  Agent: Please be advised that Rx refills may take up to 3 business days. We ask that you follow-up with your pharmacy.

## 2023-11-03 NOTE — Telephone Encounter (Signed)
 Spoke with patient and she states strictly a refill--no new symptoms or concerns. She is advised to call us  back with any questions or concerns and she verbalized understanding.

## 2023-11-09 ENCOUNTER — Ambulatory Visit: Admitting: Podiatry

## 2023-11-09 ENCOUNTER — Encounter: Payer: Self-pay | Admitting: Podiatry

## 2023-11-09 DIAGNOSIS — M79676 Pain in unspecified toe(s): Secondary | ICD-10-CM | POA: Diagnosis not present

## 2023-11-09 DIAGNOSIS — E1142 Type 2 diabetes mellitus with diabetic polyneuropathy: Secondary | ICD-10-CM | POA: Diagnosis not present

## 2023-11-09 DIAGNOSIS — B351 Tinea unguium: Secondary | ICD-10-CM | POA: Diagnosis not present

## 2023-11-09 NOTE — Progress Notes (Signed)
 Subjective:  Patient ID: [name], female    DOB: 09/05/1940,  MRN: [id number] HPI Chief Complaint  Patient presents with   Debridement    Trim toenails - diabetic - 6.3, PCP referred   New Patient (Initial Visit)    83 y.o. female presents with the above complaint.   ROS: Denies fever chills nausea mobic muscle aches pains calf pain back pain chest pain shortness of breath.  Past Medical History:  Diagnosis Date   Actinic keratosis    Arthritis    Asthma    Back injury 2019   Fracture   Basal cell carcinoma    COPD (chronic obstructive pulmonary disease) (HCC)    GERD (gastroesophageal reflux disease)    H/O blood clots 2020   Lung, Per [facility] New Patient Packet   H/O mammogram 2022   Per [facility] New Patient Packet   Heart murmur    hx of in childhood    High cholesterol    Hyperlipidemia    Hypertension    Hypothyroidism    Kidney disease    Per [facility] New Patient Packet   MCNS (minimal change nephrotic syndrome)    OSA (obstructive sleep apnea) 08/29/2019   Osteopenia    Shortness of breath dyspnea    on exertion    Squamous cell carcinoma    Thyroid  disease    Per [facility] New Patient Packet   Past Surgical History:  Procedure Laterality Date   ABDOMINAL HYSTERECTOMY     APPENDECTOMY     BILATERAL SALPINGOOPHORECTOMY     Ovarian Cysts    CHOLECYSTECTOMY N/A 06/17/2022   Procedure: LAPAROSCOPIC CHOLECYSTECTOMY;  Surgeon: Dasie Leonor CROME, MD;  [facility]: [facility];  Service: General;  Laterality: N/A;   COLONOSCOPY  2001   Dr.Mann, Per [facility] New Patient Packet   CONVERSION TO TOTAL KNEE Right 07/10/2014   Procedure: RIGHT CONVERSION OF PARTIAL KNEE TO TOTAL KNEE;  Surgeon: Donnice Car, MD;  [facility]: [facility];  Service: Orthopedics;  Laterality: Right;   ENDOSCOPIC RETROGRADE CHOLANGIOPANCREATOGRAPHY (ERCP) WITH PROPOFOL  N/A 06/15/2022   Procedure: ENDOSCOPIC RETROGRADE CHOLANGIOPANCREATOGRAPHY (ERCP) WITH PROPOFOL ;  Surgeon: Rosalie Kitchens, MD;  [facility];   Service: Gastroenterology;  Laterality: N/A;   INTRAMEDULLARY (IM) NAIL INTERTROCHANTERIC Left 11/02/2022   Procedure: INTRAMEDULLARY NAILING OF LEFT FEMUR;  Surgeon: Jerri Kay HERO, MD;  [facility];  Service: Orthopedics;  Laterality: Left;   IR ANGIOGRAM PULMONARY BILATERAL SELECTIVE  02/08/2019   IR ANGIOGRAM SELECTIVE EACH ADDITIONAL VESSEL  02/08/2019   IR ANGIOGRAM SELECTIVE EACH ADDITIONAL VESSEL  02/08/2019   IR INFUSION THROMBOL ARTERIAL INITIAL (MS)  02/08/2019   IR INFUSION THROMBOL ARTERIAL INITIAL (MS)  02/08/2019   IR THROMB F/U EVAL ART/VEN FINAL DAY (MS)  02/09/2019   IR US  GUIDE VASC ACCESS RIGHT  02/08/2019   MEDIAL PARTIAL KNEE REPLACEMENT Bilateral    MOHS SURGERY     x 2   REMOVAL OF STONES  06/15/2022   Procedure: REMOVAL OF STONES;  Surgeon: Rosalie Kitchens, MD;  [facility];  Service: Gastroenterology;;   RENAL BIOPSY     x 2   REPLACEMENT TOTAL KNEE  2012   Per [facility] New Patient Packet   ROTATOR CUFF REPAIR Left 2008   SPHINCTEROTOMY  06/15/2022   Procedure: SPHINCTEROTOMY;  Surgeon: Rosalie Kitchens, MD;  [facility];  Service: Gastroenterology;;    Current Outpatient Medications:    albuterol  (VENTOLIN  HFA) 108 (90 Base) MCG/ACT inhaler, Inhale 1-2 puffs into the lungs  every 6 (six) hours as needed for wheezing or shortness of breath., Disp: 6.7 g, Rfl: 2   apixaban  (ELIQUIS ) 5 MG TABS tablet, TAKE 1 TABLET BY MOUTH TWICE A DAY, Disp: 60 tablet, Rfl: 5   Azelastine  HCl 137 MCG/SPRAY SOLN, PLACE 2 SPRAYS INTO BOTH NOSTRILS 2 (TWO) TIMES DAILY. USE IN EACH NOSTRIL AS DIRECTED, Disp: 90 mL, Rfl: 1   citalopram  (CELEXA ) 20 MG tablet, TAKE 1 TABLET (20 MG TOTAL) BY MOUTH AT BEDTIME. DX F41.9, Disp: 90 tablet, Rfl: 3   docusate sodium  (COLACE) 100 MG capsule, Take 1 capsule (100 mg total) by mouth 2 (two) times daily., Disp: 10 capsule, Rfl: 0   donepezil  (ARICEPT ) 10 MG tablet, TAKE 1 TABLET BY MOUTH EVERYDAY AT BEDTIME, Disp: 90 tablet, Rfl: 3   ferrous  sulfate 325 (65 FE) MG EC tablet, TAKE 1 TABLET BY MOUTH EVERY DAY WITH BREAKFAST, Disp: 90 tablet, Rfl: 3   fluticasone  (FLONASE ) 50 MCG/ACT nasal spray, SPRAY 1 SPRAY INTO EACH NOSTRIL DAILY, Disp: 48 mL, Rfl: 2   Fluticasone -Umeclidin-Vilant (TRELEGY ELLIPTA ) 100-62.5-25 MCG/ACT AEPB, Inhale 1 puff into the lungs daily., Disp: 60 each, Rfl: 11   ipratropium (ATROVENT ) 0.06 % nasal spray, Place 2 sprays into both nostrils 3 (three) times daily., Disp: 15 mL, Rfl: 12   levothyroxine  (SYNTHROID ) 88 MCG tablet, TAKE 1 TABLET BY MOUTH EVERY DAY IN THE MORNING, Disp: 90 tablet, Rfl: 3   losartan  (COZAAR ) 100 MG tablet, TAKE 1 TABLET BY MOUTH EVERY DAY, Disp: 90 tablet, Rfl: 1   Melatonin 10 MG TABS, Take 10 mg by mouth at bedtime., Disp: , Rfl:    metFORMIN  (GLUCOPHAGE ) 500 MG tablet, Take 1 tablet (500 mg total) by mouth daily with supper., Disp: 90 tablet, Rfl: 1   metoprolol  tartrate (LOPRESSOR ) 25 MG tablet, Take 1 tablet (25 mg total) by mouth 2 (two) times daily., Disp: 180 tablet, Rfl: 3   Multiple Vitamin (MULTIVITAMIN WITH MINERALS) TABS tablet, Take 1 tablet by mouth daily with breakfast., Disp: , Rfl:    pantoprazole  (PROTONIX ) 40 MG tablet, Take 1 tablet (40 mg total) by mouth at bedtime., Disp: 90 tablet, Rfl: 3   rOPINIRole  (REQUIP ) 1 MG tablet, Take 1 tablet (1 mg total) by mouth at bedtime., Disp: 90 tablet, Rfl: 3   rosuvastatin  (CRESTOR ) 20 MG tablet, TAKE 1 TABLET BY MOUTH EVERY DAY, Disp: 90 tablet, Rfl: 3   tacrolimus  (PROGRAF ) 1 MG capsule, Take 1 mg by mouth in the morning and at bedtime., Disp: , Rfl:    TYLENOL  325 MG CAPS, Take 325-650 mg by mouth every 6 (six) hours as needed (for pain or headaches)., Disp: , Rfl:   Current Facility-Administered Medications:    denosumab  (PROLIA ) injection 60 mg, 60 mg, Subcutaneous, Once, [place], Harlene POUR, NP  No Known Allergies Review of Systems Objective:  There were no vitals filed for this visit.  General: Well developed,  nourished, in no acute distress, alert and oriented x3   Dermatological: Skin is warm, dry and supple bilateral. Nails x 10 are well maintained; remaining integument appears unremarkable at this time. There are no open sores, no preulcerative lesions, no rash or signs of infection present.  Nail dystrophy hallux left  Vascular: Dorsalis Pedis artery and Posterior Tibial artery pedal pulses are 2/4 bilateral with immedate capillary fill time. Pedal hair growth present. No varicosities and no lower extremity edema present bilateral.   Neruologic: Grossly intact via light touch bilateral. Vibratory intact via tuning fork  bilateral. Protective threshold with Semmes Wienstein monofilament intact to all pedal sites bilateral. Patellar and Achilles deep tendon reflexes 2+ bilateral. No Babinski or clonus noted bilateral.   Musculoskeletal: No gross boney pedal deformities bilateral. No pain, crepitus, or limitation noted with foot and ankle range of motion bilateral. Muscular strength 5/5 in all groups tested bilateral.  Gait: Unassisted, Nonantalgic.    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  Plan: Debridement of toenails 1 through 5 bilateral.     [place] T. Ruffin, [place]

## 2023-11-16 ENCOUNTER — Ambulatory Visit: Admitting: Physician Assistant

## 2023-11-19 ENCOUNTER — Encounter: Payer: Self-pay | Admitting: Physician Assistant

## 2023-11-19 ENCOUNTER — Other Ambulatory Visit (INDEPENDENT_AMBULATORY_CARE_PROVIDER_SITE_OTHER): Payer: Self-pay

## 2023-11-19 ENCOUNTER — Ambulatory Visit: Admitting: Physician Assistant

## 2023-11-19 DIAGNOSIS — S72142A Displaced intertrochanteric fracture of left femur, initial encounter for closed fracture: Secondary | ICD-10-CM

## 2023-11-19 NOTE — Progress Notes (Signed)
 Post-Op Visit Note   Patient: [name]           Date of Birth: Jul 19, 1940           MRN: [id number] Visit Date: 11/19/2023 PCP: [name], NP   Assessment & Plan:  Chief Complaint:  Chief Complaint  Patient presents with   Left Hip - Follow-up    IM nailing 11/02/2022   Visit Diagnoses:  1. Closed comminuted intertrochanteric fracture of proximal femur, left, initial encounter [facility])     Plan: Patient is a pleasant 83 year old female who comes in today a little over a year out left hip IM nail from intertrochanteric femur fracture, date of surgery 11/02/2022.  She has been doing well.  No complaints.  She has been using her bone stimulator for the past several months.  Currently ambulating with a 4 pronged cane.  Examination of the left hip reveals painless hip flexion logroll.  She is neurovascular intact distally.  At this point, I think she is clinically and radiographically healed.  She may discontinue use of the bone stimulator.  Advance with activity as tolerated.  Follow-up as needed.  Call with concerns or questions.  Follow-Up Instructions: Return if symptoms worsen or fail to improve.   Orders:  Orders Placed This Encounter  Procedures   XR FEMUR MIN 2 VIEWS LEFT   No orders of the defined types were placed in this encounter.   Imaging: XR FEMUR MIN 2 VIEWS LEFT Result Date: 11/19/2023 X-rays demonstrate consolidation of the fracture site.  No hardware complication.   PMFS History: Patient Active Problem List   Diagnosis Date Noted   Morbid obesity (HCC) 11/09/2023   MCI (mild cognitive impairment) 10/27/2023   Iron deficiency anemia secondary to inadequate dietary iron intake 10/27/2023   Sensorineural hearing loss, bilateral 04/27/2023   Impacted cerumen of both ears 04/27/2023   Other specified disorders of eustachian tube, right ear 04/27/2023   Closed comminuted intertrochanteric fracture of proximal femur, left, initial encounter  (HCC) 10/31/2022   Accidental fall 10/31/2022   Chronic anticoagulation 10/31/2022   History of pulmonary embolism 10/31/2022   History of PAT (paroxysmal atrial tachycardia) 10/31/2022   Closed left hip fracture, initial encounter (HCC) 10/31/2022   Acute pancreatitis 06/13/2022   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/26/2022   Acquired hypercoagulable state 01/26/2022   Bilateral hearing loss 11/04/2020   Type 2 diabetes mellitus with stage 3b chronic kidney disease, without long-term current use of insulin  (HCC) 11/04/2020   RLS (restless legs syndrome) 11/04/2020   OSA (obstructive sleep apnea) 08/29/2019   Abnormal findings on diagnostic imaging of lung 08/29/2019   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   Pulmonary embolism (HCC) 02/07/2019   GERD without esophagitis 05/15/2017   Dyslipidemia 05/15/2017   Anxiety 05/15/2017   Constipation due to opioid therapy 05/15/2017   Accidental fall from chair, initial encounter 05/08/2017   Closed posttraumatic compression fracture of lumbar vertebra (HCC) 05/08/2017   Leukocytosis 05/08/2017   S/P right TKA 07/10/2014   S/P knee replacement 07/10/2014   COPD (chronic obstructive pulmonary disease) (HCC) 09/24/2013   Hypothyroidism 07/24/2012   Essential hypertension, benign 07/24/2012   Allergic rhinitis 07/24/2012   Mixed hyperlipidemia 07/24/2012   Past Medical History:  Diagnosis Date   Actinic keratosis    Arthritis    Asthma    Back injury 2019   Fracture   Basal cell carcinoma    COPD (chronic obstructive pulmonary disease) (HCC)  GERD (gastroesophageal reflux disease)    H/O blood clots 2020   Lung, Per [facility] New Patient Packet   H/O mammogram 2022   Per [facility] New Patient Packet   Heart murmur    hx of in childhood    High cholesterol    Hyperlipidemia    Hypertension    Hypothyroidism    Kidney disease    Per [facility] New Patient Packet   MCNS (minimal change nephrotic syndrome)    OSA (obstructive  sleep apnea) 08/29/2019   Osteopenia    Shortness of breath dyspnea    on exertion    Squamous cell carcinoma    Thyroid  disease    Per [facility] New Patient Packet    Family History  Problem Relation Age of Onset   COPD Mother        Husband Smoked    Lymphoma Father    Hypertension Sister    Scoliosis Daughter    Stroke Maternal Grandfather    Rheumatic fever Paternal Grandmother    Heart disease Paternal Grandfather     Past Surgical History:  Procedure Laterality Date   ABDOMINAL HYSTERECTOMY     APPENDECTOMY     BILATERAL SALPINGOOPHORECTOMY     Ovarian Cysts    CHOLECYSTECTOMY N/A 06/17/2022   Procedure: LAPAROSCOPIC CHOLECYSTECTOMY;  Surgeon: Dasie Leonor CROME, MD;  [facility];  Service: General;  Laterality: N/A;   COLONOSCOPY  2001   Dr.Mann, Per [facility] New Patient Packet   CONVERSION TO TOTAL KNEE Right 07/10/2014   Procedure: RIGHT CONVERSION OF PARTIAL KNEE TO TOTAL KNEE;  Surgeon: Donnice Car, MD;  [facility];  Service: Orthopedics;  Laterality: Right;   ENDOSCOPIC RETROGRADE CHOLANGIOPANCREATOGRAPHY (ERCP) WITH PROPOFOL  N/A 06/15/2022   Procedure: ENDOSCOPIC RETROGRADE CHOLANGIOPANCREATOGRAPHY (ERCP) WITH PROPOFOL ;  Surgeon: Rosalie Kitchens, MD;  [facility];  Service: Gastroenterology;  Laterality: N/A;   INTRAMEDULLARY (IM) NAIL INTERTROCHANTERIC Left 11/02/2022   Procedure: INTRAMEDULLARY NAILING OF LEFT FEMUR;  Surgeon: Jerri Kay HERO, MD;  [facility];  Service: Orthopedics;  Laterality: Left;   IR ANGIOGRAM PULMONARY BILATERAL SELECTIVE  02/08/2019   IR ANGIOGRAM SELECTIVE EACH ADDITIONAL VESSEL  02/08/2019   IR ANGIOGRAM SELECTIVE EACH ADDITIONAL VESSEL  02/08/2019   IR INFUSION THROMBOL ARTERIAL INITIAL (MS)  02/08/2019   IR INFUSION THROMBOL ARTERIAL INITIAL (MS)  02/08/2019   IR THROMB F/U EVAL ART/VEN FINAL DAY (MS)  02/09/2019   IR US  GUIDE VASC ACCESS RIGHT  02/08/2019   MEDIAL PARTIAL KNEE REPLACEMENT Bilateral    MOHS SURGERY     x 2    REMOVAL OF STONES  06/15/2022   Procedure: REMOVAL OF STONES;  Surgeon: Rosalie Kitchens, MD;  [facility];  Service: Gastroenterology;;   RENAL BIOPSY     x 2   REPLACEMENT TOTAL KNEE  2012   Per [facility] New Patient Packet   ROTATOR CUFF REPAIR Left 2008   SPHINCTEROTOMY  06/15/2022   Procedure: SPHINCTEROTOMY;  Surgeon: Rosalie Kitchens, MD;  Location: [facility];  Service: Gastroenterology;;   Social History   Occupational History   Occupation: TEACHER     Employer: [employer]    Comment: RETIRED   Tobacco Use   Smoking status: Former    Current packs/day: 0.00    Average packs/day: 1.5 packs/day for 25.0 years (37.5 ttl pk-yrs)    Types: Cigarettes    Start date: 01/13/1948    Quit date: 01/12/1973    Years since quitting: 50.8   Smokeless  tobacco: Never  Vaping Use   Vaping status: Never Used  Substance and Sexual Activity   Alcohol  use: Yes    Alcohol /week: 0.5 standard drinks of alcohol     Types: 1 Standard drinks or equivalent per week    Comment: She occasionally drinks a glass of wine.     Drug use: No   Sexual activity: Not Currently    Partners: Male

## 2023-11-24 ENCOUNTER — Other Ambulatory Visit: Payer: Self-pay | Admitting: Nurse Practitioner

## 2023-12-02 ENCOUNTER — Other Ambulatory Visit: Payer: Self-pay

## 2023-12-02 ENCOUNTER — Telehealth: Payer: Self-pay | Admitting: *Deleted

## 2023-12-02 DIAGNOSIS — M810 Age-related osteoporosis without current pathological fracture: Secondary | ICD-10-CM

## 2023-12-02 MED ORDER — DENOSUMAB 60 MG/ML ~~LOC~~ SOSY
60.0000 mg | PREFILLED_SYRINGE | SUBCUTANEOUS | 1 refills | Status: AC
  Filled 2023-12-02 – 2023-12-03 (×2): qty 1, 180d supply, fill #0

## 2023-12-02 NOTE — Telephone Encounter (Signed)
 Submitted Verification for Prolia  through [employer] Sent Rx to [facility] to run Pharmacy benefits.  LM for patient.

## 2023-12-02 NOTE — Progress Notes (Signed)
 Pharmacy Patient Advocate Encounter  Insurance verification completed.   The patient is insured through [facility]   Ran test claim for Prolia . Co-pay is $0.  This test claim was processed through [facility]- copay amounts may vary at other pharmacies due to pharmacy/plan contracts, or as the patient moves through the different stages of their insurance plan.

## 2023-12-03 ENCOUNTER — Other Ambulatory Visit: Payer: Self-pay

## 2023-12-03 NOTE — Progress Notes (Signed)
 Specialty Pharmacy Initial Fill Coordination Note  [name] is a 83 y.o. female contacted today regarding initial fill of specialty medication(s) Denosumab  (PROLIA )   Patient requested Courier to Provider Office   Delivery date: 12/28/23   Verified address: [facility] [address]   Medication will be filled on 11/17.   Patient is aware of $0 copayment.

## 2023-12-08 MED ORDER — DENOSUMAB 60 MG/ML ~~LOC~~ SOSY: 60.0000 mg | PREFILLED_SYRINGE | SUBCUTANEOUS | Status: DC

## 2023-12-08 MED ORDER — DENOSUMAB 60 MG/ML ~~LOC~~ SOSY
60.0000 mg | PREFILLED_SYRINGE | SUBCUTANEOUS | Status: AC
  Administered 2024-01-21: 60 mg via SUBCUTANEOUS

## 2023-12-08 NOTE — Addendum Note (Signed)
 Addended by: LYNNANN COUGH A on: 12/08/2023 11:01 AM   Modules accepted: Orders

## 2023-12-08 NOTE — Telephone Encounter (Signed)
 Putman, Tiffany L, CPhT (Pharmacy Technician)             Specialty Pharmacy Initial Fill Coordination Note   [name] is a 83 y.o. female contacted today regarding initial fill of specialty medication(s) Denosumab  (PROLIA )     Patient requested Courier to Provider Office   Delivery date: 12/28/23   Verified address: [facility] [address]     Medication will be filled on 11/17.    Patient is aware of $0 copayment.

## 2023-12-10 DIAGNOSIS — D84821 Immunodeficiency due to drugs: Secondary | ICD-10-CM | POA: Diagnosis not present

## 2023-12-10 DIAGNOSIS — D6869 Other thrombophilia: Secondary | ICD-10-CM | POA: Diagnosis not present

## 2023-12-10 DIAGNOSIS — E1142 Type 2 diabetes mellitus with diabetic polyneuropathy: Secondary | ICD-10-CM | POA: Diagnosis not present

## 2023-12-10 DIAGNOSIS — E785 Hyperlipidemia, unspecified: Secondary | ICD-10-CM | POA: Diagnosis not present

## 2023-12-10 DIAGNOSIS — E1151 Type 2 diabetes mellitus with diabetic peripheral angiopathy without gangrene: Secondary | ICD-10-CM | POA: Diagnosis not present

## 2023-12-10 DIAGNOSIS — F325 Major depressive disorder, single episode, in full remission: Secondary | ICD-10-CM | POA: Diagnosis not present

## 2023-12-10 DIAGNOSIS — N1831 Chronic kidney disease, stage 3a: Secondary | ICD-10-CM | POA: Diagnosis not present

## 2023-12-10 DIAGNOSIS — J439 Emphysema, unspecified: Secondary | ICD-10-CM | POA: Diagnosis not present

## 2023-12-13 ENCOUNTER — Encounter: Payer: Self-pay | Admitting: Radiology

## 2023-12-17 ENCOUNTER — Telehealth: Payer: Self-pay

## 2023-12-17 NOTE — Telephone Encounter (Signed)
 Copied from CRM [id number]. Topic: Clinical - Prescription Issue >> Dec 17, 2023 11:47 AM Miquel SAILOR wrote: Reason for CRM: rOPINIRole  (REQUIP ) 1 MG tablet-Pt requesting to take 2 pills a day not 1 a day due to running out of pills. Needs call back when updated [phone]  Location :  [id number] - [place], [place] - [address] [place] [address] [place] [address] [place]  [address] Phone: [phone] Fax: [phone] Hours: Not open 24 hours

## 2023-12-19 NOTE — Telephone Encounter (Signed)
 Her Rx is for 1 tablet daily, recommend taking as prescribed, would not increase to 2 due to side effects.

## 2023-12-20 ENCOUNTER — Telehealth (INDEPENDENT_AMBULATORY_CARE_PROVIDER_SITE_OTHER): Admitting: Nurse Practitioner

## 2023-12-20 ENCOUNTER — Encounter: Payer: Self-pay | Admitting: Nurse Practitioner

## 2023-12-20 DIAGNOSIS — G2581 Restless legs syndrome: Secondary | ICD-10-CM | POA: Diagnosis not present

## 2023-12-20 DIAGNOSIS — D508 Other iron deficiency anemias: Secondary | ICD-10-CM | POA: Diagnosis not present

## 2023-12-20 NOTE — Telephone Encounter (Signed)
 Spoke with patient and she states that 1 tablet does not help. Patient scheduled video visit to further discuss options and symptoms.

## 2023-12-20 NOTE — Progress Notes (Signed)
 Careteam: Patient Care Team: [name], NP as PCP - General (Geriatric Medicine) Jordan, [name], MD as PCP - Cardiology (Cardiology) Ernie Cough, MD as Consulting Physician (Orthopedic Surgery) Robinson Pao, MD as Consulting Physician (Dermatology) Fleurette Brow, MD as Referring Physician (Nephrology) Shellia Oh, MD as Consulting Physician (Pulmonary Disease) Abigail Maude POUR [facility])  Advanced Directive information    No Known Allergies  Chief Complaint  Patient presents with   Medication Management    Declined vaccines     HPI: Patient is a 83 y.o. female via virtual visit Discussed the use of AI scribe software for clinical note transcription with the patient, who gave verbal consent to proceed.  History of Present Illness Lashika Erker is an 83 year old female with restless legs syndrome who presents due to requip  Rx.   She has been experiencing worsening symptoms of restless legs syndrome over the past month or two. She increased her ropinirole  twice daily, although her prescription was originally for once daily. If she does not take the medication in the morning, her legs 'jerk like crazy' by the afternoon, and taking it at night helps settle her symptoms by bedtime.  She has been taking iron pills daily, and her hemoglobin levels were noted to be normal in May.  No excessive caffeine intake, consuming only one cup per day. She has not been engaging in any non-pharmacological interventions for her restless legs syndrome.    Review of Systems:  Review of Systems  Constitutional: Negative.   Gastrointestinal: Negative.   Genitourinary: Negative.   Musculoskeletal: Negative.   Skin: Negative.   Neurological:        Increase in jerking and jumping to legs    Past Medical History:  Diagnosis Date   Actinic keratosis    Arthritis    Asthma    Back injury 2019   Fracture   Basal cell carcinoma    COPD (chronic obstructive pulmonary  disease) (HCC)    GERD (gastroesophageal reflux disease)    H/O blood clots 2020   Lung, Per [facility] New Patient Packet   H/O mammogram 2022   Per [facility] Patient Packet   Heart murmur    hx of in childhood    High cholesterol    Hyperlipidemia    Hypertension    Hypothyroidism    Kidney disease    Per [facility] New Patient Packet   MCNS (minimal change nephrotic syndrome)    OSA (obstructive sleep apnea) 08/29/2019   Osteopenia    Shortness of breath dyspnea    on exertion    Squamous cell carcinoma    Thyroid  disease    Per [facility] New Patient Packet   Past Surgical History:  Procedure Laterality Date   ABDOMINAL HYSTERECTOMY     APPENDECTOMY     BILATERAL SALPINGOOPHORECTOMY     Ovarian Cysts    CHOLECYSTECTOMY N/A 06/17/2022   Procedure: LAPAROSCOPIC CHOLECYSTECTOMY;  Surgeon: Dasie Leonor CROME, MD;  Location: [facility];  Service: General;  Laterality: N/A;   COLONOSCOPY  2001   Dr.Mann, Per [facility] New Patient Packet   CONVERSION TO TOTAL KNEE Right 07/10/2014   Procedure: RIGHT CONVERSION OF PARTIAL KNEE TO TOTAL KNEE;  Surgeon: Cough Ernie, MD;  Location: [facility];  Service: Orthopedics;  Laterality: Right;   ENDOSCOPIC RETROGRADE CHOLANGIOPANCREATOGRAPHY (ERCP) WITH PROPOFOL  N/A 06/15/2022   Procedure: ENDOSCOPIC RETROGRADE CHOLANGIOPANCREATOGRAPHY (ERCP) WITH PROPOFOL ;  Surgeon: Rosalie Kitchens, MD;  Location: [facility];  Service: Gastroenterology;  Laterality: N/A;  INTRAMEDULLARY (IM) NAIL INTERTROCHANTERIC Left 11/02/2022   Procedure: INTRAMEDULLARY NAILING OF LEFT FEMUR;  Surgeon: Jerri Kay HERO, MD;  Location: [facility];  Service: Orthopedics;  Laterality: Left;   IR ANGIOGRAM PULMONARY BILATERAL SELECTIVE  02/08/2019   IR ANGIOGRAM SELECTIVE EACH ADDITIONAL VESSEL  02/08/2019   IR ANGIOGRAM SELECTIVE EACH ADDITIONAL VESSEL  02/08/2019   IR INFUSION THROMBOL ARTERIAL INITIAL (MS)  02/08/2019   IR INFUSION THROMBOL ARTERIAL INITIAL (MS)  02/08/2019   IR THROMB F/U EVAL ART/VEN FINAL DAY (MS)   02/09/2019   IR US  GUIDE VASC ACCESS RIGHT  02/08/2019   MEDIAL PARTIAL KNEE REPLACEMENT Bilateral    MOHS SURGERY     x 2   REMOVAL OF STONES  06/15/2022   Procedure: REMOVAL OF STONES;  Surgeon: Rosalie Kitchens, MD;  Location: [facility];  Service: Gastroenterology;;   RENAL BIOPSY     x 2   REPLACEMENT TOTAL KNEE  2012   Per [facility] New Patient Packet   ROTATOR CUFF REPAIR Left 2008   SPHINCTEROTOMY  06/15/2022   Procedure: SPHINCTEROTOMY;  Surgeon: Rosalie Kitchens, MD;  Location: [facility];  Service: Gastroenterology;;   Social History:   reports that she quit smoking about 50 years ago. Her smoking use included cigarettes. She started smoking about 75 years ago. She has a 37.5 pack-year smoking history. She has never used smokeless tobacco. She reports current alcohol  use of about 0.5 standard drinks of alcohol  per week. She reports that she does not use drugs.  Family History  Problem Relation Age of Onset   COPD Mother        Husband Smoked    Lymphoma Father    Hypertension Sister    Scoliosis Daughter    Stroke Maternal Grandfather    Rheumatic fever Paternal Grandmother    Heart disease Paternal Grandfather     Medications: Patient's Medications  New Prescriptions   No medications on file  Previous Medications   ALBUTEROL  (VENTOLIN  HFA) 108 (90 BASE) MCG/ACT INHALER    Inhale 1-2 puffs into the lungs every 6 (six) hours as needed for wheezing or shortness of breath.   AZELASTINE  HCL 137 MCG/SPRAY SOLN    PLACE 2 SPRAYS INTO BOTH NOSTRILS 2 (TWO) TIMES DAILY. USE IN EACH NOSTRIL AS DIRECTED   CITALOPRAM  (CELEXA ) 20 MG TABLET    TAKE 1 TABLET (20 MG TOTAL) BY MOUTH AT BEDTIME. DX F41.9   DENOSUMAB  (PROLIA ) 60 MG/ML SOSY INJECTION    Inject 60 mg into the skin every 6 (six) months.   DOCUSATE SODIUM  (COLACE) 100 MG CAPSULE    Take 1 capsule (100 mg total) by mouth 2 (two) times daily.   DONEPEZIL  (ARICEPT ) 10 MG TABLET    TAKE 1 TABLET BY MOUTH EVERYDAY AT BEDTIME   ELIQUIS  5 MG  TABS TABLET    TAKE 1 TABLET BY MOUTH TWICE A DAY   FERROUS SULFATE  325 (65 FE) MG EC TABLET    TAKE 1 TABLET BY MOUTH EVERY DAY WITH BREAKFAST   FLUTICASONE  (FLONASE ) 50 MCG/ACT NASAL SPRAY    SPRAY 1 SPRAY INTO EACH NOSTRIL DAILY   FLUTICASONE -UMECLIDIN-VILANT (TRELEGY ELLIPTA ) 100-62.5-25 MCG/ACT AEPB    Inhale 1 puff into the lungs daily.   IPRATROPIUM (ATROVENT ) 0.06 % NASAL SPRAY    Place 2 sprays into both nostrils 3 (three) times daily.   LEVOTHYROXINE  (SYNTHROID ) 88 MCG TABLET    TAKE 1 TABLET BY MOUTH EVERY DAY IN THE MORNING   LOSARTAN  (COZAAR ) 100 MG  TABLET    TAKE 1 TABLET BY MOUTH EVERY DAY   MELATONIN 10 MG TABS    Take 10 mg by mouth at bedtime.   METFORMIN  (GLUCOPHAGE ) 500 MG TABLET    Take 1 tablet (500 mg total) by mouth daily with supper.   METOPROLOL  TARTRATE (LOPRESSOR ) 25 MG TABLET    Take 1 tablet (25 mg total) by mouth 2 (two) times daily.   MULTIPLE VITAMIN (MULTIVITAMIN WITH MINERALS) TABS TABLET    Take 1 tablet by mouth daily with breakfast.   PANTOPRAZOLE  (PROTONIX ) 40 MG TABLET    Take 1 tablet (40 mg total) by mouth at bedtime.   ROPINIROLE  (REQUIP ) 1 MG TABLET    Take 1 tablet (1 mg total) by mouth at bedtime.   ROSUVASTATIN  (CRESTOR ) 20 MG TABLET    TAKE 1 TABLET BY MOUTH EVERY DAY   TACROLIMUS  (PROGRAF ) 1 MG CAPSULE    Take 1 mg by mouth in the morning and at bedtime.   TYLENOL  325 MG CAPS    Take 325-650 mg by mouth every 6 (six) hours as needed (for pain or headaches).  Modified Medications   No medications on file  Discontinued Medications   No medications on file    Physical Exam:  There were no vitals filed for this visit. There is no height or weight on file to calculate BMI. Wt Readings from Last 3 Encounters:  10/25/23 229 lb 3.2 oz (104 kg)  06/14/23 217 lb 6.4 oz (98.6 kg)  04/26/23 212 lb (96.2 kg)    Physical Exam Constitutional:      Appearance: Normal appearance.  Pulmonary:     Effort: Pulmonary effort is normal.  Neurological:      Mental Status: She is alert. Mental status is at baseline.  Psychiatric:        Mood and Affect: Mood normal.     Labs reviewed: Basic Metabolic Panel: Recent Labs    12/23/22 1501 06/17/23 1105  NA 141 141  K 4.9 4.1  CL 103 104  CO2 28 30  GLUCOSE 99 120*  BUN 23 20  CREATININE 1.19* 0.96*  CALCIUM  9.7 9.4  TSH 2.91  --    Liver Function Tests: Recent Labs    06/17/23 1105  AST 13  ALT 10  BILITOT 0.5  PROT 7.1   No results for input(s): LIPASE, AMYLASE in the last 8760 hours. No results for input(s): AMMONIA in the last 8760 hours. CBC: Recent Labs    12/23/22 1501 06/17/23 1105  WBC 10.0 7.1  NEUTROABS 6,850 4,452  HGB 12.8 12.7  HCT 40.6 40.5  MCV 94.0 88.4  PLT 319 255   Lipid Panel: Recent Labs    12/23/22 1501  CHOL 112  HDL 52  LDLCALC 33  TRIG 204*  CHOLHDL 2.2   TSH: Recent Labs    12/23/22 1501  TSH 2.91   A1C: Lab Results  Component Value Date   HGBA1C 6.3 (H) 06/17/2023     Assessment/Plan Assessment and Plan Assessment & Plan Restless legs syndrome Symptoms worsened. Ropinirole  1 mg daily continued due to prescription limitations. High risk of tolerance and side effects noted. Iron deficiency may contribute to symptoms. - Continue ropinirole  1 mg daily in the evening. - Ordered iron level test. - Encouraged non-pharmacological interventions: exercise, compresses, massage, weighted blanket. - Advised against increasing ropinirole  dose. - Scheduled follow-up on December 5th at 1:40 PM.  Iron deficiency anemia Iron levels require re-evaluation due to potential exacerbation  of restless legs syndrome symptoms. Hemoglobin levels were good in May. - Ordered iron level test. - Scheduled blood work on November 25th. - Continue daily iron supplementation.    To keep follow up as scheduled.  Jameshia Hayashida K. Caro BODILY  [facility] [phone]    Virtual Visit via video  I connected  with patient on 12/20/23 at 11:20 AM EST by mychart and verified that I am speaking with the correct person using two identifiers.  Location: Patient: home Provider: [facility]   I discussed the limitations, risks, security and privacy concerns of performing an evaluation and management service by telephone and the availability of in person appointments. I also discussed with the patient that there may be a patient responsible charge related to this service. The patient expressed understanding and agreed to proceed.   I discussed the assessment and treatment plan with the patient. The patient was provided an opportunity to ask questions and all were answered. The patient agreed with the plan and demonstrated an understanding of the instructions.   The patient was advised to call back or seek an in-person evaluation if the symptoms worsen or if the condition fails to improve as anticipated.  I provided 15 minutes of non-face-to-face time during this encounter.  Keyaria Lawson K. Caro BODILY Avs printed and mailed

## 2023-12-20 NOTE — Progress Notes (Signed)
 This service is provided via telemedicine  No vital signs collected/recorded due to the encounter was a telemedicine visit.   Location of patient (ex: home, work):  home  Patient consents to a telephone visit: yes  Location of the provider (ex: office, home):  [facility]   Name of any referring provider:  N/A  Names of all persons participating in the telemedicine service and their role in the encounter:  Evertt Elnor An, Harlene POUR, NP and Patient.   Time spent on call: 8

## 2023-12-27 ENCOUNTER — Other Ambulatory Visit: Payer: Self-pay

## 2023-12-28 NOTE — Telephone Encounter (Signed)
 Prolia  received in office located in refrigerator.. Patient has a scheduled appointment on 01/21/2024.

## 2023-12-30 ENCOUNTER — Other Ambulatory Visit: Payer: Self-pay | Admitting: Nurse Practitioner

## 2023-12-30 DIAGNOSIS — E039 Hypothyroidism, unspecified: Secondary | ICD-10-CM

## 2023-12-30 NOTE — Telephone Encounter (Signed)
 Copied from CRM [id number]. Topic: Clinical - Medication Refill >> Dec 30, 2023 12:02 PM Chiquita SQUIBB wrote: Medication:  levothyroxine  levothyroxine  (SYNTHROID ) 88 MCG tablet   Has the patient contacted their pharmacy? Yes (Agent: If no, request that the patient contact the pharmacy for the refill. If patient does not wish to contact the pharmacy document the reason why and proceed with request.) (Agent: If yes, when and what did the pharmacy advise?)  This is the patient's preferred pharmacy:  [id number] - [place], [place] - [address] [place] [address] [place] [address] [place] [address] Phone: [phone] Fax: [phone]   Is this the correct pharmacy for this prescription? Yes If no, delete pharmacy and type the correct one.   Has the prescription been filled recently? No  Is the patient out of the medication? Yes  Has the patient been seen for an appointment in the last year OR does the patient have an upcoming appointment? Yes  Can we respond through MyChart? Yes  Agent: Please be advised that Rx refills may take up to 3 business days. We ask that you follow-up with your pharmacy.

## 2023-12-31 MED ORDER — LEVOTHYROXINE SODIUM 88 MCG PO TABS: ORAL_TABLET | ORAL | 0 refills | Status: AC

## 2024-01-04 ENCOUNTER — Other Ambulatory Visit: Payer: Self-pay

## 2024-01-04 DIAGNOSIS — C44612 Basal cell carcinoma of skin of right upper limb, including shoulder: Secondary | ICD-10-CM | POA: Diagnosis not present

## 2024-01-10 ENCOUNTER — Encounter: Payer: Self-pay | Admitting: Adult Health

## 2024-01-10 ENCOUNTER — Ambulatory Visit

## 2024-01-10 ENCOUNTER — Ambulatory Visit: Admitting: Adult Health

## 2024-01-10 VITALS — BP 126/74 | HR 131 | Temp 97.3°F | Ht 66.0 in | Wt 235.0 lb

## 2024-01-10 DIAGNOSIS — J209 Acute bronchitis, unspecified: Secondary | ICD-10-CM

## 2024-01-10 DIAGNOSIS — Z8679 Personal history of other diseases of the circulatory system: Secondary | ICD-10-CM

## 2024-01-10 DIAGNOSIS — I10 Essential (primary) hypertension: Secondary | ICD-10-CM

## 2024-01-10 MED ORDER — DOXYCYCLINE HYCLATE 100 MG PO TABS: 100.0000 mg | ORAL_TABLET | Freq: Two times a day (BID) | ORAL | 0 refills | Status: AC

## 2024-01-10 MED ORDER — PREDNISONE 20 MG PO TABS: ORAL_TABLET | ORAL | 0 refills | Status: AC

## 2024-01-10 MED ORDER — DM-GUAIFENESIN ER 30-600 MG PO TB12: 1.0000 | ORAL_TABLET | Freq: Two times a day (BID) | ORAL | Status: AC

## 2024-01-10 NOTE — Patient Instructions (Signed)
 Acute Bronchitis, Adult  Acute bronchitis is when air tubes in the lungs (bronchi) suddenly get swollen. The condition can make it hard for you to breathe. In adults, acute bronchitis usually goes away within 2 weeks. A cough caused by bronchitis may last up to 3 weeks. Smoking, allergies, and asthma can make the condition worse. What are the causes? Germs that cause cold and flu (viruses). The most common cause of this condition is the virus that causes the common cold. Bacteria. Substances that bother (irritate) the lungs, including: Smoke from cigarettes and other types of tobacco. Dust and pollen. Fumes from chemicals, gases, or burned fuel. Indoor or outdoor air pollution. What increases the risk? A weak body's defense system. This is also called the immune system. Any condition that affects your lungs and breathing, such as asthma. What are the signs or symptoms? A cough. Coughing up clear, yellow, or green mucus. Making high-pitched whistling sounds when you breathe, most often when you breathe out (wheezing). Runny or stuffy nose. Having too much mucus in your lungs (chest congestion). Shortness of breath. Body aches. A sore throat. How is this treated? Acute bronchitis may go away over time without treatment. Your doctor may tell you to: Drink more fluids. This will help thin your mucus so it is easier to cough up. Use a device that gets medicine into your lungs (inhaler). Use a vaporizer or a humidifier. These are machines that add water to the air. This helps with coughing and poor breathing. Take a medicine that thins mucus and helps clear it from your lungs. Take a medicine that prevents or stops coughing. It is not common to take an antibiotic medicine for this condition. Follow these instructions at home:  Take over-the-counter and prescription medicines only as told by your doctor. Use an inhaler, vaporizer, or humidifier as told by your doctor. Take two teaspoons  (10 mL) of honey at bedtime. This helps lessen your coughing at night. Drink enough fluid to keep your pee (urine) pale yellow. Do not smoke or use any products that contain nicotine or tobacco. If you need help quitting, ask your doctor. Get a lot of rest. Return to your normal activities when your doctor says that it is safe. Keep all follow-up visits. How is this prevented?  Wash your hands often with soap and water for at least 20 seconds. If you cannot use soap and water, use hand sanitizer. Avoid contact with people who have cold symptoms. Try not to touch your mouth, nose, or eyes with your hands. Avoid breathing in smoke or chemical fumes. Make sure to get the flu shot every year. Contact a doctor if: Your symptoms do not get better in 2 weeks. You have trouble coughing up the mucus. Your cough keeps you awake at night. You have a fever. Get help right away if: You cough up blood. You have chest pain. You have very bad shortness of breath. You faint or keep feeling like you are going to faint. You have a very bad headache. Your fever or chills get worse. These symptoms may be an emergency. Get help right away. Call your local emergency services (911 in the [place] Do not wait to see if the symptoms will go away. Do not drive yourself to the hospital. Summary Acute bronchitis is when air tubes in the lungs (bronchi) suddenly get swollen. In adults, acute bronchitis usually goes away within 2 weeks. Drink more fluids. This will help thin your mucus so it is easier  to cough up. Take over-the-counter and prescription medicines only as told by your doctor. Contact a doctor if your symptoms do not improve after 2 weeks of treatment. This information is not intended to replace advice given to you by your health care provider. Make sure you discuss any questions you have with your health care provider. Document Revised: 05/29/2020 Document Reviewed: 05/29/2020 Elsevier Patient  Education  2024 [employer].

## 2024-01-10 NOTE — Progress Notes (Signed)
 [facility] clinic  Provider:  Jereld Serum DNP  Code Status:  Full Code  Goals of Care:     10/25/2023   11:29 AM  Advanced Directives  Does Patient Have a Medical Advance Directive? Yes  Type of Estate Agent of [place];Living will  Does patient want to make changes to medical advance directive? No - Patient declined  Copy of Healthcare Power of Attorney in Chart? No - copy requested     Chief Complaint  Patient presents with   Cough    Ongoing since Thursday does not believe she has been exposed to Covid or Flu. Today she started feeling bad    Discussed the use of AI scribe software for clinical note transcription with the patient, who gave verbal consent to proceed.  HPI: Patient is a 83 y.o. female seen today for an acute visit for cough. She is accompanied by her daughter.  Her cough began on Thursday afternoon and has progressively worsened. It is described as 'real croupy' and productive, though she struggles to expectorate. Her daughter notes that the cough is severe enough to be heard from another floor of the house. She has been using Mucinex  DM 1200 mg and Delsym , which have provided some relief. No fever is present, but she feels unwell today with a dull headache that started today, located around her eyes. She experiences wheezing and shortness of breath with her cough.  She has a history of COPD and uses Trelegy Ellipta  100/62.5/25 mcg, inhaling one puff daily, and a rescue inhaler, albuterol , inhaling one to two puffs every six hours as needed. She also uses Flonase  50 mcg, one spray into each nostril daily, and Atrovent  0.06%, two sprays into both nostrils three times a day, though she continues to experience nasal dripping. Additionally, she uses azelastine  nasal spray 137 mcg, two sprays into both nostrils twice a day for allergies.  She has a history of atrial fibrillation for which she takes Eliquis  5 mg twice a day and metoprolol   tartrate 25 mg twice a day. She also has hypertension, managed with losartan  100 mg daily and metoprolol  tartrate 25 mg twice a day.  She lives with her daughter and grandson. She quit smoking approximately 20 years ago.    Past Medical History:  Diagnosis Date   Actinic keratosis    Arthritis    Asthma    Back injury 2019   Fracture   Basal cell carcinoma    COPD (chronic obstructive pulmonary disease) (HCC)    GERD (gastroesophageal reflux disease)    H/O blood clots 2020   Lung, Per [facility] New Patient Packet   H/O mammogram 2022   Per [facility] New Patient Packet   Heart murmur    hx of in childhood    High cholesterol    Hyperlipidemia    Hypertension    Hypothyroidism    Kidney disease    Per [facility] New Patient Packet   MCNS (minimal change nephrotic syndrome)    OSA (obstructive sleep apnea) 08/29/2019   Osteopenia    Shortness of breath dyspnea    on exertion    Squamous cell carcinoma    Thyroid  disease    Per [facility] New Patient Packet    Past Surgical History:  Procedure Laterality Date   ABDOMINAL HYSTERECTOMY     APPENDECTOMY     BILATERAL SALPINGOOPHORECTOMY     Ovarian Cysts    CHOLECYSTECTOMY N/A 06/17/2022   Procedure: LAPAROSCOPIC CHOLECYSTECTOMY;  Surgeon: Dasie Best  L, MD;  [facility];  Service: General;  Laterality: N/A;   COLONOSCOPY  2001   Dr.Mann, Per [facility] New Patient Packet   CONVERSION TO TOTAL KNEE Right 07/10/2014   Procedure: RIGHT CONVERSION OF PARTIAL KNEE TO TOTAL KNEE;  Surgeon: Donnice Car, MD;  [facility];  Service: Orthopedics;  Laterality: Right;   ENDOSCOPIC RETROGRADE CHOLANGIOPANCREATOGRAPHY (ERCP) WITH PROPOFOL  N/A 06/15/2022   Procedure: ENDOSCOPIC RETROGRADE CHOLANGIOPANCREATOGRAPHY (ERCP) WITH PROPOFOL ;  Surgeon: Rosalie Kitchens, MD;  [facility];  Service: Gastroenterology;  Laterality: N/A;   INTRAMEDULLARY (IM) NAIL INTERTROCHANTERIC Left 11/02/2022   Procedure: INTRAMEDULLARY NAILING OF LEFT FEMUR;  Surgeon: Jerri Kay HERO, MD;  [facility];  Service: Orthopedics;  Laterality: Left;   IR ANGIOGRAM PULMONARY BILATERAL SELECTIVE  02/08/2019   IR ANGIOGRAM SELECTIVE EACH ADDITIONAL VESSEL  02/08/2019   IR ANGIOGRAM SELECTIVE EACH ADDITIONAL VESSEL  02/08/2019   IR INFUSION THROMBOL ARTERIAL INITIAL (MS)  02/08/2019   IR INFUSION THROMBOL ARTERIAL INITIAL (MS)  02/08/2019   IR THROMB F/U EVAL ART/VEN FINAL DAY (MS)  02/09/2019   IR US  GUIDE VASC ACCESS RIGHT  02/08/2019   MEDIAL PARTIAL KNEE REPLACEMENT Bilateral    MOHS SURGERY     x 2   REMOVAL OF STONES  06/15/2022   Procedure: REMOVAL OF STONES;  Surgeon: Rosalie Kitchens, MD;  [facility];  Service: Gastroenterology;;   RENAL BIOPSY     x 2   REPLACEMENT TOTAL KNEE  2012   Per [facility] New Patient Packet   ROTATOR CUFF REPAIR Left 2008   SPHINCTEROTOMY  06/15/2022   Procedure: SPHINCTEROTOMY;  Surgeon: Rosalie Kitchens, MD;  [facility];  Service: Gastroenterology;;    No Known Allergies  Outpatient Encounter Medications as of 01/10/2024  Medication Sig   albuterol  (VENTOLIN  HFA) 108 (90 Base) MCG/ACT inhaler Inhale 1-2 puffs into the lungs every 6 (six) hours as needed for wheezing or shortness of breath.   Azelastine  HCl 137 MCG/SPRAY SOLN PLACE 2 SPRAYS INTO BOTH NOSTRILS 2 (TWO) TIMES DAILY. USE IN EACH NOSTRIL AS DIRECTED   citalopram  (CELEXA ) 20 MG tablet TAKE 1 TABLET (20 MG TOTAL) BY MOUTH AT BEDTIME. DX F41.9   denosumab  (PROLIA ) 60 MG/ML SOSY injection Inject 60 mg into the skin every 6 (six) months.   dextromethorphan -guaiFENesin  (MUCINEX  DM) 30-600 MG 12hr tablet Take 1 tablet by mouth 2 (two) times daily for 14 days.   donepezil  (ARICEPT ) 10 MG tablet TAKE 1 TABLET BY MOUTH EVERYDAY AT BEDTIME   doxycycline  (VIBRA -TABS) 100 MG tablet Take 1 tablet (100 mg total) by mouth 2 (two) times daily for 10 days.   ELIQUIS  5 MG TABS tablet TAKE 1 TABLET BY MOUTH TWICE A DAY   ferrous sulfate  325 (65 FE) MG EC tablet TAKE 1 TABLET  BY MOUTH EVERY DAY WITH BREAKFAST   fluticasone  (FLONASE ) 50 MCG/ACT nasal spray SPRAY 1 SPRAY INTO EACH NOSTRIL DAILY   Fluticasone -Umeclidin-Vilant (TRELEGY ELLIPTA ) 100-62.5-25 MCG/ACT AEPB Inhale 1 puff into the lungs daily.   ipratropium (ATROVENT ) 0.06 % nasal spray Place 2 sprays into both nostrils 3 (three) times daily.   levothyroxine  (SYNTHROID ) 88 MCG tablet TAKE 1 TABLET BY MOUTH EVERY DAY IN THE MORNING   losartan  (COZAAR ) 100 MG tablet TAKE 1 TABLET BY MOUTH EVERY DAY   Melatonin 10 MG TABS Take 10 mg by mouth at bedtime.   metFORMIN  (GLUCOPHAGE ) 500 MG tablet Take 1 tablet (500 mg total) by mouth daily with supper.  metoprolol  tartrate (LOPRESSOR ) 25 MG tablet Take 1 tablet (25 mg total) by mouth 2 (two) times daily.   Multiple Vitamin (MULTIVITAMIN WITH MINERALS) TABS tablet Take 1 tablet by mouth daily with breakfast.   pantoprazole  (PROTONIX ) 40 MG tablet Take 1 tablet (40 mg total) by mouth at bedtime.   predniSONE  (DELTASONE ) 20 MG tablet Take 2 tablets (40 mg total) by mouth daily with breakfast for 3 days, THEN 1 tablet (20 mg total) daily with breakfast for 3 days.   rOPINIRole  (REQUIP ) 1 MG tablet Take 1 tablet (1 mg total) by mouth at bedtime.   rosuvastatin  (CRESTOR ) 20 MG tablet TAKE 1 TABLET BY MOUTH EVERY DAY   tacrolimus  (PROGRAF ) 1 MG capsule Take 1 mg by mouth in the morning and at bedtime.   TYLENOL  325 MG CAPS Take 325-650 mg by mouth every 6 (six) hours as needed (for pain or headaches).   docusate sodium  (COLACE) 100 MG capsule Take 1 capsule (100 mg total) by mouth 2 (two) times daily. (Patient not taking: Reported on 01/10/2024)   Facility-Administered Encounter Medications as of 01/10/2024  Medication   denosumab  (PROLIA ) injection 60 mg   denosumab  (PROLIA ) injection 60 mg    Review of Systems:  Review of Systems  Constitutional:  Negative for appetite change, chills, fatigue and fever.  HENT:  Negative for congestion, hearing loss, rhinorrhea and  sore throat.   Eyes: Negative.   Respiratory:  Positive for cough, shortness of breath and wheezing.   Cardiovascular:  Negative for chest pain, palpitations and leg swelling.  Gastrointestinal:  Negative for abdominal pain, constipation, diarrhea, nausea and vomiting.  Genitourinary:  Negative for dysuria.  Musculoskeletal:  Negative for arthralgias, back pain and myalgias.  Skin:  Negative for color change, rash and wound.  Neurological:  Negative for dizziness, weakness and headaches.  Psychiatric/Behavioral:  Negative for behavioral problems. The patient is not nervous/anxious.     Health Maintenance  Topic Date Due   DTaP/Tdap/Td (5 - Td or Tdap) 12/08/2020   FOOT EXAM  03/10/2022   Diabetic kidney evaluation - Urine ACR  07/31/2023   COVID-19 Vaccine (4 - 2025-26 season) 10/11/2023   HEMOGLOBIN A1C  12/18/2023   Medicare Annual Wellness (AWV)  06/06/2024   Diabetic kidney evaluation - eGFR measurement  06/16/2024   Mammogram  09/13/2024   OPHTHALMOLOGY EXAM  09/30/2024   Pneumococcal Vaccine: 50+ Years  Completed   Influenza Vaccine  Completed   Bone Density Scan  Completed   Zoster Vaccines- Shingrix  Completed   Meningococcal B Vaccine  Aged Out   Hepatitis B Vaccines 19-59 Average Risk  Discontinued    Physical Exam: Vitals:   01/10/24 1457  BP: 126/74  Pulse: (!) 131  Temp: (!) 97.3 F (36.3 C)  TempSrc: Temporal  SpO2: 96%  Weight: 235 lb (106.6 kg)  Height: 5' 6 (1.676 m)   Body mass index is 37.93 kg/m. Physical Exam Constitutional:      General: She is not in acute distress.    Appearance: She is obese.  HENT:     Head: Normocephalic and atraumatic.     Nose: Nose normal.     Mouth/Throat:     Mouth: Mucous membranes are moist.  Eyes:     Conjunctiva/sclera: Conjunctivae normal.  Cardiovascular:     Rate and Rhythm: Normal rate and regular rhythm.  Pulmonary:     Effort: Pulmonary effort is normal.     Breath sounds: Wheezing and rhonchi  present.  Abdominal:     General: Bowel sounds are normal.     Palpations: Abdomen is soft.  Musculoskeletal:        General: Normal range of motion.     Cervical back: Normal range of motion.  Skin:    General: Skin is warm and dry.  Neurological:     General: No focal deficit present.     Mental Status: She is alert and oriented to person, place, and time.  Psychiatric:        Mood and Affect: Mood normal.        Behavior: Behavior normal.        Thought Content: Thought content normal.        Judgment: Judgment normal.     Labs reviewed: Basic Metabolic Panel: Recent Labs    06/17/23 1105  NA 141  K 4.1  CL 104  CO2 30  GLUCOSE 120*  BUN 20  CREATININE 0.96*  CALCIUM  9.4   Liver Function Tests: Recent Labs    06/17/23 1105  AST 13  ALT 10  BILITOT 0.5  PROT 7.1   No results for input(s): LIPASE, AMYLASE in the last 8760 hours. No results for input(s): AMMONIA in the last 8760 hours. CBC: Recent Labs    06/17/23 1105  WBC 7.1  NEUTROABS 4,452  HGB 12.7  HCT 40.5  MCV 88.4  PLT 255   Lipid Panel: No results for input(s): CHOL, HDL, LDLCALC, TRIG, CHOLHDL, LDLDIRECT in the last 8760 hours. Lab Results  Component Value Date   HGBA1C 6.3 (H) 06/17/2023    Procedures since last visit: No results found.  Assessment/Plan  1. Acute bronchitis, unspecified organism (Primary)  -  in the setting of of COPD with wheezing and productive cough, likely COPD exacerbation. Oxygen  saturation at 98%. - Prescribed prednisone  40 mg daily for 3 days, then 20 mg daily for 3 days. - Prescribed doxycycline  100 mg twice daily for 10 days. - Continue Trelegy Ellipta  100/62.5/25 mcg, inhale one puff daily. - Use albuterol  inhaler, inhale 1-2 puffs every 6 hours as needed. - Use Flonase  50 mcg, one spray in each nostril daily. - Use Atrovent  0.06% nasal spray, two sprays in each nostril three times a day. - Use azelastine  nasal spray, two sprays in  each nostril twice a day. - Use Mucinex  DM, 600 mg twice daily for 14 days. - Advised steam inhalation and increased fluid intake. - Recommended universal precautions to prevent infection spread.  2. History of PAT (paroxysmal atrial tachycardia) -  managed with anticoagulation and rate control. - Continue Eliquis  5 mg twice daily for anticoagulation. - Continue metoprolol  tartrate 25 mg twice daily for rate control.  3. Essential hypertension, benign -  well-controlled with current medication regimen. - Continue metoprolol  tartrate 25 mg twice daily. - Continue losartan  100 mg daily.      Labs/tests ordered:   None   Return if symptoms worsen or fail to improve.  Khalen Styer Medina-Vargas, NP

## 2024-01-12 ENCOUNTER — Ambulatory Visit: Admitting: Physician Assistant

## 2024-01-12 ENCOUNTER — Other Ambulatory Visit: Payer: Self-pay

## 2024-01-12 DIAGNOSIS — S52502A Unspecified fracture of the lower end of left radius, initial encounter for closed fracture: Secondary | ICD-10-CM

## 2024-01-12 NOTE — Progress Notes (Signed)
 Office Visit Note   Patient: [name]           Date of Birth: 05/20/1940           MRN: [id number] Visit Date: 01/12/2024              Requested by: [name], NP [address] [place]. [place],  [place] [address] PCP: [name], NP   Assessment & Plan: Visit Diagnoses:  1. Closed fracture of distal end of left radius, unspecified fracture morphology, initial encounter     Plan: Impression is left distal radius and ulnar styloid fractures with acceptable alignment.  Patient is now 1 week out from injury.  We discussed placing her in a short arm cast for the next 2 weeks.  Follow-up at that time for repeat evaluation and x-rays in the cast.  Call with concerns or questions in the meantime.  Follow-Up Instructions: Return in about 2 weeks (around 01/26/2024).   Orders:  Orders Placed This Encounter  Procedures   XR Wrist Complete Left   No orders of the defined types were placed in this encounter.     Procedures: No procedures performed   Clinical Data: No additional findings.   Subjective: Chief Complaint  Patient presents with   Left Wrist - Pain    DOI 01/05/2024    HPI patient is a very pleasant 83 year old right-hand-dominant female who comes in today following an injury to her left wrist.  Approximately 1 week ago, she fell landing on her left wrist.  She was seen at an urgent care setting who is unable to do x-rays at that time.  Her daughter bought her a neoprene sleeve which she has been wearing.  She is having some pain which does seem to be somewhat relieved with Tylenol .  Of note, she is on Eliquis .  Review of Systems as detailed in HPI.  All others reviewed and are negative.   Objective: Vital Signs: There were no vitals taken for this visit.  Physical Exam well-developed nourished female in no acute distress.  Alert and oriented x 3.  Ortho Exam left wrist exam reveals moderate ecchymosis throughout.  Mild swelling  throughout.  She does have tenderness to the dorsum of the wrist.  She has discomfort with range of motion of the wrist.  She is neurovascularly intact distally.  Specialty Comments:  No specialty comments available.  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   Impacted cerumen of both ears 04/27/2023   Other specified disorders of eustachian tube, right ear 04/27/2023   Closed comminuted intertrochanteric fracture of proximal femur, left, initial encounter (HCC) 10/31/2022   Accidental fall 10/31/2022   Chronic anticoagulation 10/31/2022   History of pulmonary embolism 10/31/2022   History of PAT (paroxysmal atrial tachycardia) 10/31/2022   Closed left hip fracture, initial encounter (HCC) 10/31/2022   Acute pancreatitis 06/13/2022   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/26/2022   Acquired hypercoagulable state 01/26/2022   Bilateral hearing loss 11/04/2020   Type 2 diabetes mellitus with stage 3b chronic kidney disease, without long-term current use of insulin  (HCC) 11/04/2020   RLS (restless legs  syndrome) 11/04/2020   OSA (obstructive sleep apnea) 08/29/2019   Abnormal findings on diagnostic imaging of lung 08/29/2019   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   Pulmonary embolism (HCC) 02/07/2019   GERD without esophagitis 05/15/2017   Dyslipidemia 05/15/2017   Anxiety 05/15/2017   Constipation due to opioid therapy 05/15/2017   Accidental fall from chair, initial encounter 05/08/2017   Closed posttraumatic compression fracture of lumbar vertebra (HCC) 05/08/2017   Leukocytosis 05/08/2017    S/P right TKA 07/10/2014   S/P knee replacement 07/10/2014   COPD (chronic obstructive pulmonary disease) (HCC) 09/24/2013   Hypothyroidism 07/24/2012   Essential hypertension, benign 07/24/2012   Allergic rhinitis 07/24/2012   Mixed hyperlipidemia 07/24/2012   Past Medical History:  Diagnosis Date   Actinic keratosis    Arthritis    Asthma    Back injury 2019   Fracture   Basal cell carcinoma    COPD (chronic obstructive pulmonary disease) (HCC)    GERD (gastroesophageal reflux disease)    H/O blood clots 2020   Lung, Per [facility] New Patient Packet   H/O mammogram 2022   Per [facility] New Patient Packet   Heart murmur    hx of in childhood    High cholesterol    Hyperlipidemia    Hypertension    Hypothyroidism    Kidney disease    Per [facility] New Patient Packet   MCNS (minimal change nephrotic syndrome)    OSA (obstructive sleep apnea) 08/29/2019   Osteopenia    Shortness of breath dyspnea    on exertion    Squamous cell carcinoma    Thyroid  disease    Per [facility] New Patient Packet    Family History  Problem Relation Age of Onset   COPD Mother        Husband Smoked    Lymphoma Father    Hypertension Sister    Scoliosis Daughter    Stroke Maternal Grandfather    Rheumatic fever Paternal Grandmother    Heart disease Paternal Grandfather     Past Surgical History:  Procedure Laterality Date   ABDOMINAL HYSTERECTOMY     APPENDECTOMY     BILATERAL SALPINGOOPHORECTOMY     Ovarian Cysts    CHOLECYSTECTOMY N/A 06/17/2022   Procedure: LAPAROSCOPIC CHOLECYSTECTOMY;  Surgeon: Dasie Leonor CROME, MD;  [facility];  Service: General;  Laterality: N/A;   COLONOSCOPY  2001   Dr.Mann, Per [facility] New Patient Packet   CONVERSION TO TOTAL KNEE Right 07/10/2014   Procedure: RIGHT CONVERSION OF PARTIAL KNEE TO TOTAL KNEE;  Surgeon: Donnice Car, MD;  [facility];  Service: Orthopedics;  Laterality: Right;   ENDOSCOPIC RETROGRADE CHOLANGIOPANCREATOGRAPHY (ERCP) WITH PROPOFOL  N/A 06/15/2022    Procedure: ENDOSCOPIC RETROGRADE CHOLANGIOPANCREATOGRAPHY (ERCP) WITH PROPOFOL ;  Surgeon: Rosalie Kitchens, MD;  [facility];  Service: Gastroenterology;  Laterality: N/A;   INTRAMEDULLARY (IM) NAIL INTERTROCHANTERIC Left 11/02/2022   Procedure: INTRAMEDULLARY NAILING OF LEFT FEMUR;  Surgeon: Jerri Kay HERO, MD;  [facility];  Service: Orthopedics;  Laterality: Left;   IR ANGIOGRAM PULMONARY BILATERAL SELECTIVE  02/08/2019   IR ANGIOGRAM SELECTIVE EACH ADDITIONAL VESSEL  02/08/2019   IR ANGIOGRAM SELECTIVE EACH ADDITIONAL VESSEL  02/08/2019   IR INFUSION THROMBOL ARTERIAL INITIAL (MS)  02/08/2019   IR INFUSION THROMBOL ARTERIAL INITIAL (MS)  02/08/2019   IR THROMB F/U EVAL ART/VEN FINAL DAY (MS)  02/09/2019   IR US  GUIDE VASC ACCESS RIGHT  02/08/2019   MEDIAL PARTIAL KNEE REPLACEMENT Bilateral    MOHS  SURGERY     x 2   REMOVAL OF STONES  06/15/2022   Procedure: REMOVAL OF STONES;  Surgeon: Rosalie Kitchens, MD;  [facility];  Service: Gastroenterology;;   RENAL BIOPSY     x 2   REPLACEMENT TOTAL KNEE  2012   Per [facility] New Patient Packet   ROTATOR CUFF REPAIR Left 2008   SPHINCTEROTOMY  06/15/2022   Procedure: SPHINCTEROTOMY;  Surgeon: Rosalie Kitchens, MD;  [facility];  Service: Gastroenterology;;   Social History   Occupational History   Occupation: TEACHER     Employer: [employer]    Comment: RETIRED   Tobacco Use   Smoking status: Former    Current packs/day: 0.00    Average packs/day: 1.5 packs/day for 25.0 years (37.5 ttl pk-yrs)    Types: Cigarettes    Start date: 01/13/1948    Quit date: 01/12/1973    Years since quitting: 51.0   Smokeless tobacco: Never  Vaping Use   Vaping status: Never Used  Substance and Sexual Activity   Alcohol  use: Yes    Alcohol /week: 0.5 standard drinks of alcohol     Types: 1 Standard drinks or equivalent per week    Comment: She occasionally drinks a glass of wine.     Drug use: No   Sexual activity: Not  Currently    Partners: Male

## 2024-01-14 ENCOUNTER — Other Ambulatory Visit

## 2024-01-14 ENCOUNTER — Ambulatory Visit: Payer: Self-pay | Admitting: Nurse Practitioner

## 2024-01-14 DIAGNOSIS — N1832 Chronic kidney disease, stage 3b: Secondary | ICD-10-CM

## 2024-01-14 DIAGNOSIS — D508 Other iron deficiency anemias: Secondary | ICD-10-CM

## 2024-01-14 DIAGNOSIS — I2609 Other pulmonary embolism with acute cor pulmonale: Secondary | ICD-10-CM

## 2024-01-14 DIAGNOSIS — F419 Anxiety disorder, unspecified: Secondary | ICD-10-CM

## 2024-01-14 DIAGNOSIS — E039 Hypothyroidism, unspecified: Secondary | ICD-10-CM

## 2024-01-14 DIAGNOSIS — G2581 Restless legs syndrome: Secondary | ICD-10-CM

## 2024-01-19 ENCOUNTER — Other Ambulatory Visit

## 2024-01-20 LAB — COMPREHENSIVE METABOLIC PANEL WITH GFR
AG Ratio: 1 (calc) (ref 1.0–2.5)
ALT: 20 U/L (ref 6–29)
AST: 16 U/L (ref 10–35)
Albumin: 3.5 g/dL — ABNORMAL LOW (ref 3.6–5.1)
Alkaline phosphatase (APISO): 69 U/L (ref 37–153)
BUN/Creatinine Ratio: 22 (calc) (ref 6–22)
BUN: 26 mg/dL — ABNORMAL HIGH (ref 7–25)
CO2: 31 mmol/L (ref 20–32)
Calcium: 9 mg/dL (ref 8.6–10.4)
Chloride: 101 mmol/L (ref 98–110)
Creat: 1.17 mg/dL — ABNORMAL HIGH (ref 0.60–0.95)
Globulin: 3.4 g/dL (ref 1.9–3.7)
Glucose, Bld: 132 mg/dL — ABNORMAL HIGH (ref 65–99)
Potassium: 4.3 mmol/L (ref 3.5–5.3)
Sodium: 140 mmol/L (ref 135–146)
Total Bilirubin: 0.5 mg/dL (ref 0.2–1.2)
Total Protein: 6.9 g/dL (ref 6.1–8.1)
eGFR: 46 mL/min/1.73m2 — ABNORMAL LOW (ref >=60)

## 2024-01-20 LAB — CBC WITH DIFFERENTIAL/PLATELET
Absolute Lymphocytes: 2545 {cells}/uL (ref 850–3900)
Absolute Monocytes: 1141 {cells}/uL — ABNORMAL HIGH (ref 200–950)
Basophils Absolute: 61 {cells}/uL (ref 0–200)
Basophils Relative: 0.6 %
Eosinophils Absolute: 172 {cells}/uL (ref 15–500)
Eosinophils Relative: 1.7 %
HCT: 37.4 % (ref 35.9–46.0)
Hemoglobin: 11.1 g/dL — ABNORMAL LOW (ref 11.7–15.5)
MCH: 25.3 pg — ABNORMAL LOW (ref 27.0–33.0)
MCHC: 29.7 g/dL — ABNORMAL LOW (ref 31.6–35.4)
MCV: 85.4 fL (ref 81.4–101.7)
MPV: 10.4 fL (ref 7.5–12.5)
Monocytes Relative: 11.3 %
Neutro Abs: 6181 {cells}/uL (ref 1500–7800)
Neutrophils Relative %: 61.2 %
Platelets: 367 Thousand/uL (ref 140–400)
RBC: 4.38 Million/uL (ref 3.80–5.10)
RDW: 14.6 % (ref 11.0–15.0)
Total Lymphocyte: 25.2 %
WBC: 10.1 Thousand/uL (ref 3.8–10.8)

## 2024-01-20 LAB — LIPID PANEL
Cholesterol: 95 mg/dL (ref <200)
HDL: 53 mg/dL (ref >=50)
LDL Cholesterol (Calc): 22 mg/dL
Non-HDL Cholesterol (Calc): 42 mg/dL (ref <130)
Total CHOL/HDL Ratio: 1.8 (calc) (ref <5.0)
Triglycerides: 110 mg/dL (ref <150)

## 2024-01-20 LAB — TSH: TSH: 1.05 m[IU]/L (ref 0.40–4.50)

## 2024-01-20 LAB — HEMOGLOBIN A1C
Hgb A1c MFr Bld: 6.6 % — ABNORMAL HIGH (ref <5.7)
Mean Plasma Glucose: 143 mg/dL
eAG (mmol/L): 7.9 mmol/L

## 2024-01-20 LAB — IRON,TIBC AND FERRITIN PANEL
%SAT: 11 % — ABNORMAL LOW (ref 16–45)
Ferritin: 13 ng/mL — ABNORMAL LOW (ref 16–288)
Iron: 35 ug/dL — ABNORMAL LOW (ref 45–160)
TIBC: 333 ug/dL (ref 250–450)

## 2024-01-21 ENCOUNTER — Encounter: Payer: Self-pay | Admitting: Nurse Practitioner

## 2024-01-21 ENCOUNTER — Other Ambulatory Visit

## 2024-01-21 ENCOUNTER — Ambulatory Visit: Admitting: Nurse Practitioner

## 2024-01-21 VITALS — BP 130/70 | HR 72 | Temp 97.5°F | Resp 18 | Ht 66.0 in | Wt 230.8 lb

## 2024-01-21 DIAGNOSIS — E1142 Type 2 diabetes mellitus with diabetic polyneuropathy: Secondary | ICD-10-CM

## 2024-01-21 DIAGNOSIS — D509 Iron deficiency anemia, unspecified: Secondary | ICD-10-CM

## 2024-01-21 DIAGNOSIS — G3184 Mild cognitive impairment, so stated: Secondary | ICD-10-CM

## 2024-01-21 DIAGNOSIS — G4733 Obstructive sleep apnea (adult) (pediatric): Secondary | ICD-10-CM

## 2024-01-21 DIAGNOSIS — Z7984 Long term (current) use of oral hypoglycemic drugs: Secondary | ICD-10-CM

## 2024-01-21 DIAGNOSIS — G2581 Restless legs syndrome: Secondary | ICD-10-CM

## 2024-01-21 DIAGNOSIS — I10 Essential (primary) hypertension: Secondary | ICD-10-CM

## 2024-01-21 DIAGNOSIS — J449 Chronic obstructive pulmonary disease, unspecified: Secondary | ICD-10-CM

## 2024-01-21 DIAGNOSIS — Z86711 Personal history of pulmonary embolism: Secondary | ICD-10-CM

## 2024-01-21 DIAGNOSIS — F419 Anxiety disorder, unspecified: Secondary | ICD-10-CM

## 2024-01-21 DIAGNOSIS — E039 Hypothyroidism, unspecified: Secondary | ICD-10-CM | POA: Diagnosis not present

## 2024-01-21 DIAGNOSIS — M810 Age-related osteoporosis without current pathological fracture: Secondary | ICD-10-CM

## 2024-01-21 NOTE — Patient Instructions (Addendum)
 Mucinex  DM twice daily with full glass of water  To use as needed albuterol  for shortness of breath or coughing fit.    Increase iron to twice daily with meals Hematology referral due to low iron To collect stool to test of blood and then bring back to office

## 2024-01-21 NOTE — Progress Notes (Signed)
 Careteam: Patient Care Team: [name], NP as PCP - General (Geriatric Medicine) Jordan, [name], MD as PCP - Cardiology (Cardiology) Ernie Cough, MD as Consulting Physician (Orthopedic Surgery) Robinson Pao, MD as Consulting Physician (Dermatology) Fleurette Brow, MD as Referring Physician (Nephrology) Shellia Oh, MD as Consulting Physician (Pulmonary Disease) Abigail Maude POUR (Optometry)  PLACE OF SERVICE:  [facility]  Advanced Directive information    Allergies[1]  Chief Complaint  Patient presents with   Medical Management of Chronic Issues    Routine visit Prolia     HPI:  Discussed the use of AI scribe software for clinical note transcription with the patient, who gave verbal consent to proceed.  History of Present Illness [name] is an 83 year old female here for a follow-up visit. She is accompanied by her daughter-in-law, [name].  She experiences difficulty breathing, which she attributes to a recent bout of bronchitis. She was seen a week ago for this issue and was prescribed antibiotics and Mucinex . There is some improvement, but she continues to experience chest congestion and uses Mucinex  DM and albuterol  as needed.  She has chronic kidney disease and her creatinine was recently measured at 1.17, which is similar to previous values. She sees her nephrologist annually and is on Prograf  for her kidney condition.  Her blood work shows low iron levels despite daily supplementation. No dark, tarry stools or visible blood in her stool. She experiences worsening restless leg syndrome, which she manages with medication at night.   No issues with constipation, taking a stool softener every other day.  Her diabetes is managed with metformin  500 mg daily with supper. Her A1c has increased slightly but remains within the diabetic range.  She takes Crestor  20 mg daily for cholesterol   Synthroid  88 mcg daily for thyroid  function.   She also takes  calcium  and vitamin D  for osteoporosis and received a Prolia  injection today.  No worsening of her acid reflux symptoms   continues on citalopram  20 mg daily for mood stabilization. No worsening anxiety or depression and describes herself as 'laid back'.  She has a history of pulmonary embolism and is on Eliquis . She does not use her CPAP machine for sleep apnea as it disrupts her sleep. She is on Aricept  10 mg at bedtime for memory support and reports no changes in memory.      Review of Systems:  Review of Systems  Constitutional:  Negative for chills, fever and weight loss.  HENT:  Negative for tinnitus.   Respiratory:  Negative for cough, sputum production and shortness of breath.   Cardiovascular:  Negative for chest pain, palpitations and leg swelling.  Gastrointestinal:  Negative for abdominal pain, constipation, diarrhea and heartburn.  Genitourinary:  Negative for dysuria, frequency and urgency.  Musculoskeletal:  Negative for back pain, falls, joint pain and myalgias.  Skin: Negative.   Neurological:  Negative for dizziness and headaches.  Psychiatric/Behavioral:  Negative for depression and memory loss. The patient does not have insomnia.     Past Medical History:  Diagnosis Date   Actinic keratosis    Arthritis    Asthma    Back injury 2019   Fracture   Basal cell carcinoma    COPD (chronic obstructive pulmonary disease) (HCC)    GERD (gastroesophageal reflux disease)    H/O blood clots 2020   Lung, Per [facility] New Patient Packet   H/O mammogram 2022   Per [facility] New Patient Packet   Heart  murmur    hx of in childhood    High cholesterol    Hyperlipidemia    Hypertension    Hypothyroidism    Kidney disease    Per [facility] New Patient Packet   MCNS (minimal change nephrotic syndrome)    OSA (obstructive sleep apnea) 08/29/2019   Osteopenia    Shortness of breath dyspnea    on exertion    Squamous cell carcinoma    Thyroid  disease    Per [facility] New Patient Packet    Past Surgical History:  Procedure Laterality Date   ABDOMINAL HYSTERECTOMY     APPENDECTOMY     BILATERAL SALPINGOOPHORECTOMY     Ovarian Cysts    CHOLECYSTECTOMY N/A 06/17/2022   Procedure: LAPAROSCOPIC CHOLECYSTECTOMY;  Surgeon: Dasie Leonor CROME, MD;  Location: [facility];  Service: General;  Laterality: N/A;   COLONOSCOPY  2001   Dr.Mann, Per [facility] New Patient Packet   CONVERSION TO TOTAL KNEE Right 07/10/2014   Procedure: RIGHT CONVERSION OF PARTIAL KNEE TO TOTAL KNEE;  Surgeon: Donnice Car, MD;  Location: [facility];  Service: Orthopedics;  Laterality: Right;   ENDOSCOPIC RETROGRADE CHOLANGIOPANCREATOGRAPHY (ERCP) WITH PROPOFOL  N/A 06/15/2022   Procedure: ENDOSCOPIC RETROGRADE CHOLANGIOPANCREATOGRAPHY (ERCP) WITH PROPOFOL ;  Surgeon: Rosalie Kitchens, MD;  [facility];  Service: Gastroenterology;  Laterality: N/A;   INTRAMEDULLARY (IM) NAIL INTERTROCHANTERIC Left 11/02/2022   Procedure: INTRAMEDULLARY NAILING OF LEFT FEMUR;  Surgeon: Jerri Kay HERO, MD;  [facility];  Service: Orthopedics;  Laterality: Left;   IR ANGIOGRAM PULMONARY BILATERAL SELECTIVE  02/08/2019   IR ANGIOGRAM SELECTIVE EACH ADDITIONAL VESSEL  02/08/2019   IR ANGIOGRAM SELECTIVE EACH ADDITIONAL VESSEL  02/08/2019   IR INFUSION THROMBOL ARTERIAL INITIAL (MS)  02/08/2019   IR INFUSION THROMBOL ARTERIAL INITIAL (MS)  02/08/2019   IR THROMB F/U EVAL ART/VEN FINAL DAY (MS)  02/09/2019   IR US  GUIDE VASC ACCESS RIGHT  02/08/2019   MEDIAL PARTIAL KNEE REPLACEMENT Bilateral    MOHS SURGERY     x 2   REMOVAL OF STONES  06/15/2022   Procedure: REMOVAL OF STONES;  Surgeon: Rosalie Kitchens, MD;  [facility];  Service: Gastroenterology;;   RENAL BIOPSY     x 2   REPLACEMENT TOTAL KNEE  2012   Per [facility] New Patient Packet   ROTATOR CUFF REPAIR Left 2008   SPHINCTEROTOMY  06/15/2022   Procedure: SPHINCTEROTOMY;  Surgeon: Rosalie Kitchens, MD;  [facility];  Service: Gastroenterology;;   Social History:   reports that  she quit smoking about 51 years ago. Her smoking use included cigarettes. She started smoking about 76 years ago. She has a 37.5 pack-year smoking history. She has never used smokeless tobacco. She reports current alcohol  use of about 0.5 standard drinks of alcohol  per week. She reports that she does not use drugs.  Family History  Problem Relation Age of Onset   COPD Mother        Husband Smoked    Lymphoma Father    Hypertension Sister    Scoliosis Daughter    Stroke Maternal Grandfather    Rheumatic fever Paternal Grandmother    Heart disease Paternal Grandfather     Medications: Patient's Medications  New Prescriptions   No medications on file  Previous Medications   ALBUTEROL  (VENTOLIN  HFA) 108 (90 BASE) MCG/ACT INHALER    Inhale 1-2 puffs into the lungs every 6 (six) hours as needed for wheezing or shortness of breath.   AZELASTINE  HCL 137 MCG/SPRAY  SOLN    PLACE 2 SPRAYS INTO BOTH NOSTRILS 2 (TWO) TIMES DAILY. USE IN EACH NOSTRIL AS DIRECTED   CITALOPRAM  (CELEXA ) 20 MG TABLET    TAKE 1 TABLET (20 MG TOTAL) BY MOUTH AT BEDTIME. DX F41.9   DENOSUMAB  (PROLIA ) 60 MG/ML SOSY INJECTION    Inject 60 mg into the skin every 6 (six) months.   DEXTROMETHORPHAN -GUAIFENESIN  (MUCINEX  DM) 30-600 MG 12HR TABLET    Take 1 tablet by mouth 2 (two) times daily for 14 days.   DOCUSATE SODIUM  (COLACE) 100 MG CAPSULE    Take 1 capsule (100 mg total) by mouth 2 (two) times daily.   DONEPEZIL  (ARICEPT ) 10 MG TABLET    TAKE 1 TABLET BY MOUTH EVERYDAY AT BEDTIME   ELIQUIS  5 MG TABS TABLET    TAKE 1 TABLET BY MOUTH TWICE A DAY   FERROUS SULFATE  325 (65 FE) MG EC TABLET    TAKE 1 TABLET BY MOUTH EVERY DAY WITH BREAKFAST   FLUTICASONE  (FLONASE ) 50 MCG/ACT NASAL SPRAY    SPRAY 1 SPRAY INTO EACH NOSTRIL DAILY   FLUTICASONE -UMECLIDIN-VILANT (TRELEGY ELLIPTA ) 100-62.5-25 MCG/ACT AEPB    Inhale 1 puff into the lungs daily.   IPRATROPIUM (ATROVENT ) 0.06 % NASAL SPRAY    Place 2 sprays into both nostrils 3 (three)  times daily.   LEVOTHYROXINE  (SYNTHROID ) 88 MCG TABLET    TAKE 1 TABLET BY MOUTH EVERY DAY IN THE MORNING   LOSARTAN  (COZAAR ) 100 MG TABLET    TAKE 1 TABLET BY MOUTH EVERY DAY   MELATONIN 10 MG TABS    Take 10 mg by mouth at bedtime.   METFORMIN  (GLUCOPHAGE ) 500 MG TABLET    Take 1 tablet (500 mg total) by mouth daily with supper.   METOPROLOL  TARTRATE (LOPRESSOR ) 25 MG TABLET    Take 1 tablet (25 mg total) by mouth 2 (two) times daily.   MULTIPLE VITAMIN (MULTIVITAMIN WITH MINERALS) TABS TABLET    Take 1 tablet by mouth daily with breakfast.   PANTOPRAZOLE  (PROTONIX ) 40 MG TABLET    Take 1 tablet (40 mg total) by mouth at bedtime.   ROPINIROLE  (REQUIP ) 1 MG TABLET    Take 1 tablet (1 mg total) by mouth at bedtime.   ROSUVASTATIN  (CRESTOR ) 20 MG TABLET    TAKE 1 TABLET BY MOUTH EVERY DAY   TACROLIMUS  (PROGRAF ) 1 MG CAPSULE    Take 1 mg by mouth in the morning and at bedtime.   TYLENOL  325 MG CAPS    Take 325-650 mg by mouth every 6 (six) hours as needed (for pain or headaches).  Modified Medications   No medications on file  Discontinued Medications   No medications on file    Physical Exam:  Vitals:   01/21/24 1307  BP: 130/70  Pulse: 72  Resp: 18  Temp: (!) 97.5 F (36.4 C)  SpO2: 98%  Weight: 230 lb 12.8 oz (104.7 kg)  Height: 5' 6 (1.676 m)   Body mass index is 37.25 kg/m. Wt Readings from Last 3 Encounters:  01/21/24 230 lb 12.8 oz (104.7 kg)  01/10/24 235 lb (106.6 kg)  10/25/23 229 lb 3.2 oz (104 kg)    Physical Exam Constitutional:      General: She is not in acute distress.    Appearance: She is well-developed. She is not diaphoretic.  HENT:     Head: Normocephalic and atraumatic.     Mouth/Throat:     Pharynx: No oropharyngeal exudate.  Eyes:  Conjunctiva/sclera: Conjunctivae normal.     Pupils: Pupils are equal, round, and reactive to light.  Cardiovascular:     Rate and Rhythm: Normal rate and regular rhythm.     Heart sounds: Normal heart sounds.   Pulmonary:     Effort: Pulmonary effort is normal.     Breath sounds: Normal breath sounds.  Abdominal:     General: Bowel sounds are normal.     Palpations: Abdomen is soft.  Musculoskeletal:     Cervical back: Normal range of motion and neck supple.     Right lower leg: No edema.     Left lower leg: No edema.  Skin:    General: Skin is warm and dry.  Neurological:     Mental Status: She is alert.  Psychiatric:        Mood and Affect: Mood normal.     Labs reviewed: Basic Metabolic Panel: Recent Labs    06/17/23 1105 01/19/24 0944  NA 141 140  K 4.1 4.3  CL 104 101  CO2 30 31  GLUCOSE 120* 132*  BUN 20 26*  CREATININE 0.96* 1.17*  CALCIUM  9.4 9.0  TSH  --  1.05   Liver Function Tests: Recent Labs    06/17/23 1105 01/19/24 0944  AST 13 16  ALT 10 20  BILITOT 0.5 0.5  PROT 7.1 6.9   No results for input(s): LIPASE, AMYLASE in the last 8760 hours. No results for input(s): AMMONIA in the last 8760 hours. CBC: Recent Labs    06/17/23 1105 01/19/24 0944  WBC 7.1 10.1  NEUTROABS 4,452 6,181  HGB 12.7 11.1*  HCT 40.5 37.4  MCV 88.4 85.4  PLT 255 367   Lipid Panel: Recent Labs    01/19/24 0944  CHOL 95  HDL 53  LDLCALC 22  TRIG 110  CHOLHDL 1.8   TSH: Recent Labs    01/19/24 0944  TSH 1.05   A1C: Lab Results  Component Value Date   HGBA1C 6.6 (H) 01/19/2024     Assessment/Plan Assessment and Plan Assessment & Plan Iron deficiency anemia No known blood loss, Possible causes include blood loss, inadequate absorption, or increased breakdown. Anemia may exacerbate restless legs syndrome. - Increased iron supplement to twice daily with meals. - Ordered fecal occult blood test. - Referred to hematology for further evaluation.  Type 2 diabetes mellitus with diabetic polyneuropathy Diabetes well-controlled with metformin . A1c slightly elevated but within range. - Continue metformin  500 mg daily with supper. - Advised to monitor  sugar intake. -Encouraged dietary compliance, routine foot care/monitoring and to keep up with diabetic eye exams through ophthalmology   Restless legs syndrome Symptoms manageable with current medication. Anemia may worsen symptoms. - Continue current management.  Chronic kidney disease Well-managed with creatinine at baseline. No worsening of kidney function. - Continue current management. - Performed urine test for proteinuria.  Chronic obstructive pulmonary disease Recent exacerbation likely due to bronchitis. Symptoms improving with Mucinex  DM and antibiotics.  - Continue Mucinex  DM twice daily with water. - Use albuterol  as needed. - Continue Trelegy one puff daily.  Osteoporosis, post-menopausal Managed with calcium , vitamin D , and Prolia  injections. - Continue calcium  and vitamin D  supplementation. - Administered Prolia  injection today.  Acquired hypothyroidism Well-controlled with Synthroid . TSH at goal. - Continue Synthroid  88 mcg daily.  Hyperlipidemia Well-controlled with Crestor . LDL at goal. - Continue Crestor  20 mg daily.  Mild cognitive impairment Managed with Aricept . - Continue Aricept  10 mg at bedtime.  Anxiety disorder Managed  with citalopram . No worsening of anxiety or depression. - Continue citalopram  20 mg daily.  History of pulmonary embolism Managed with Eliquis . - Continue Eliquis  as prescribed.  General Health Maintenance Routine health maintenance discussed. - Scheduled follow-up in six months with labs before the visit.     Return in about 6 months (around 07/21/2024) for routine follow up, labs prior to visit.  Noland Pizano K. Caro BODILY [facility] [phone]      [1] No Known Allergies

## 2024-01-22 LAB — MICROALBUMIN / CREATININE URINE RATIO
Creatinine, Urine: 125 mg/dL (ref 20–275)
Microalb Creat Ratio: 7 mg/g{creat} (ref <30)
Microalb, Ur: 0.9 mg/dL

## 2024-01-23 ENCOUNTER — Other Ambulatory Visit: Payer: Self-pay | Admitting: Nurse Practitioner

## 2024-01-24 ENCOUNTER — Ambulatory Visit: Payer: Self-pay | Admitting: Nurse Practitioner

## 2024-01-24 NOTE — Telephone Encounter (Signed)
 Copied from CRM [id number]. Topic: Clinical - Lab/Test Results >> Jan 24, 2024  1:13 PM Debby BROCKS wrote: Reason for CRM: Patient returned call. Relayed results successfully and no follow up questions needed.

## 2024-01-26 ENCOUNTER — Other Ambulatory Visit (INDEPENDENT_AMBULATORY_CARE_PROVIDER_SITE_OTHER)

## 2024-01-26 ENCOUNTER — Other Ambulatory Visit: Payer: Self-pay | Admitting: Nurse Practitioner

## 2024-01-26 ENCOUNTER — Ambulatory Visit: Admitting: Physician Assistant

## 2024-01-26 DIAGNOSIS — S52502A Unspecified fracture of the lower end of left radius, initial encounter for closed fracture: Secondary | ICD-10-CM

## 2024-01-26 NOTE — Progress Notes (Signed)
 Office Visit Note   Patient: [name]           Date of Birth: [birth year]           MRN: [id number] Visit Date: 01/26/2024              Requested by: [name], NP [address] [place]. [place],  [place] [address] PCP: [name], NP   Assessment & Plan: Visit Diagnoses:  1. Closed fracture of distal end of left radius, unspecified fracture morphology, initial encounter     Plan: Impression is 3 weeks status post left distal radius and ulnar styloid fractures.  X-rays are stable.  This is patient's nondominant hand and she is fairly low demand so we will continue treating this nonoperatively.  Like for her to continue wearing a cast for another 3 weeks.  Follow-up at that time for repeat evaluation and three-view x-rays of the left wrist out of the cast.  Call with concerns or questions.  Follow-Up Instructions: Return in about 3 weeks (around 02/16/2024).   Orders:  Orders Placed This Encounter  Procedures   XR Wrist Complete Left   No orders of the defined types were placed in this encounter.     Procedures: No procedures performed   Clinical Data: No additional findings.   Subjective: Chief Complaint  Patient presents with   Left Wrist - Follow-up    Distal radius fracture    HPI patient is a pleasant 83 year old right-hand-dominant female who comes in today approximately 3 weeks status post left distal radius and ulnar styloid fractures.  She has been in a short arm cast.  She is doing well.  She is in minimal to no pain.    Review of Systems as detailed in HPI.  All others reviewed and are negative.   Objective: Vital Signs: There were no vitals taken for this visit.  Physical Exam well-developed well-nourished female in no acute distress.  Alert and oriented x 3.  Ortho Exam Examination of her left upper extremity: Patient is in the cast.  No swelling in the fingers.  Fingers warm well-perfused.  She is neurovasc intact  distally.  Specialty Comments:  No specialty comments available.  Imaging: XR Wrist Complete Left Result Date: 01/26/2024 Xrays demonstrate stable alignment of the fracture    PMFS History: Patient Active Problem List   Diagnosis Date Noted   Morbid obesity (HCC) 11/09/2023   MCI (mild cognitive impairment) 10/27/2023   Iron deficiency anemia secondary to inadequate dietary iron intake 10/27/2023   Sensorineural hearing loss, bilateral 04/27/2023   Impacted cerumen of both ears 04/27/2023   Other specified disorders of eustachian tube, right ear 04/27/2023   Closed comminuted intertrochanteric fracture of proximal femur, left, initial encounter (HCC) 10/31/2022   Accidental fall 10/31/2022   Chronic anticoagulation 10/31/2022   History of pulmonary embolism 10/31/2022   History of PAT (paroxysmal atrial tachycardia) 10/31/2022   Closed left hip fracture, initial encounter (HCC) 10/31/2022   Acute pancreatitis 06/13/2022   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/26/2022   Acquired hypercoagulable state 01/26/2022   Bilateral hearing loss 11/04/2020   Type 2 diabetes mellitus with stage 3b chronic kidney disease, without long-term current use of insulin  (HCC) 11/04/2020   RLS (restless legs syndrome) 11/04/2020   OSA (obstructive sleep apnea) 08/29/2019   Abnormal findings on diagnostic imaging of lung 08/29/2019   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   Pulmonary embolism (HCC) 02/07/2019   GERD without esophagitis  05/15/2017   Dyslipidemia 05/15/2017   Anxiety 05/15/2017   Constipation due to opioid therapy 05/15/2017   Accidental fall from chair, initial encounter 05/08/2017   Closed posttraumatic compression fracture of lumbar vertebra (HCC) 05/08/2017   Leukocytosis 05/08/2017   S/P right TKA 07/10/2014   S/P knee replacement 07/10/2014   COPD (chronic obstructive pulmonary disease) (HCC) 09/24/2013   Hypothyroidism 07/24/2012   Essential hypertension,  benign 07/24/2012   Allergic rhinitis 07/24/2012   Mixed hyperlipidemia 07/24/2012   Past Medical History:  Diagnosis Date   Actinic keratosis    Arthritis    Asthma    Back injury 2019   Fracture   Basal cell carcinoma    COPD (chronic obstructive pulmonary disease) (HCC)    GERD (gastroesophageal reflux disease)    H/O blood clots 2020   Lung, Per [facility] New Patient Packet   H/O mammogram 2022   Per [facility] New Patient Packet   Heart murmur    hx of in childhood    High cholesterol    Hyperlipidemia    Hypertension    Hypothyroidism    Kidney disease    Per [facility] New Patient Packet   MCNS (minimal change nephrotic syndrome)    OSA (obstructive sleep apnea) 08/29/2019   Osteopenia    Shortness of breath dyspnea    on exertion    Squamous cell carcinoma    Thyroid  disease    Per [facility] New Patient Packet    Family History  Problem Relation Age of Onset   COPD Mother        Husband Smoked    Lymphoma Father    Hypertension Sister    Scoliosis Daughter    Stroke Maternal Grandfather    Rheumatic fever Paternal Grandmother    Heart disease Paternal Grandfather     Past Surgical History:  Procedure Laterality Date   ABDOMINAL HYSTERECTOMY     APPENDECTOMY     BILATERAL SALPINGOOPHORECTOMY     Ovarian Cysts    CHOLECYSTECTOMY N/A 06/17/2022   Procedure: LAPAROSCOPIC CHOLECYSTECTOMY;  Surgeon: Dasie Leonor CROME, MD;  [facility];  Service: General;  Laterality: N/A;   COLONOSCOPY  2001   Dr.Mann, Per [facility] New Patient Packet   CONVERSION TO TOTAL KNEE Right 07/10/2014   Procedure: RIGHT CONVERSION OF PARTIAL KNEE TO TOTAL KNEE;  Surgeon: Donnice Car, MD;  [facility];  Service: Orthopedics;  Laterality: Right;   ENDOSCOPIC RETROGRADE CHOLANGIOPANCREATOGRAPHY (ERCP) WITH PROPOFOL  N/A 06/15/2022   Procedure: ENDOSCOPIC RETROGRADE CHOLANGIOPANCREATOGRAPHY (ERCP) WITH PROPOFOL ;  Surgeon: Rosalie Kitchens, MD;  [facility];  Service: Gastroenterology;  Laterality: N/A;    INTRAMEDULLARY (IM) NAIL INTERTROCHANTERIC Left 11/02/2022   Procedure: INTRAMEDULLARY NAILING OF LEFT FEMUR;  Surgeon: Jerri Kay HERO, MD;  [facility];  Service: Orthopedics;  Laterality: Left;   IR ANGIOGRAM PULMONARY BILATERAL SELECTIVE  02/08/2019   IR ANGIOGRAM SELECTIVE EACH ADDITIONAL VESSEL  02/08/2019   IR ANGIOGRAM SELECTIVE EACH ADDITIONAL VESSEL  02/08/2019   IR INFUSION THROMBOL ARTERIAL INITIAL (MS)  02/08/2019   IR INFUSION THROMBOL ARTERIAL INITIAL (MS)  02/08/2019   IR THROMB F/U EVAL ART/VEN FINAL DAY (MS)  02/09/2019   IR US  GUIDE VASC ACCESS RIGHT  02/08/2019   MEDIAL PARTIAL KNEE REPLACEMENT Bilateral    MOHS SURGERY     x 2   REMOVAL OF STONES  06/15/2022   Procedure: REMOVAL OF STONES;  Surgeon: Rosalie Kitchens, MD;  [facility];  Service: Gastroenterology;;   RENAL BIOPSY  x 2   REPLACEMENT TOTAL KNEE  2012   Per [facility] New Patient Packet   ROTATOR CUFF REPAIR Left 2008   SPHINCTEROTOMY  06/15/2022   Procedure: SPHINCTEROTOMY;  Surgeon: Rosalie Kitchens, MD;  [facility];  Service: Gastroenterology;;   Social History   Occupational History   Occupation: TEACHER     Employer: [employer]    Comment: RETIRED   Tobacco Use   Smoking status: Former    Current packs/day: 0.00    Average packs/day: 1.5 packs/day for 25.0 years (37.5 ttl pk-yrs)    Types: Cigarettes    Start date: 01/13/1948    Quit date: 01/12/1973    Years since quitting: 51.0   Smokeless tobacco: Never  Vaping Use   Vaping status: Never Used  Substance and Sexual Activity   Alcohol  use: Yes    Alcohol /week: 0.5 standard drinks of alcohol     Types: 1 Standard drinks or equivalent per week    Comment: She occasionally drinks a glass of wine.     Drug use: No   Sexual activity: Not Currently    Partners: Male

## 2024-01-28 ENCOUNTER — Ambulatory Visit: Payer: Self-pay | Admitting: Nurse Practitioner

## 2024-01-28 DIAGNOSIS — D509 Iron deficiency anemia, unspecified: Secondary | ICD-10-CM

## 2024-01-28 LAB — FECAL GLOBIN BY IMMUNOCHEMISTRY
FECAL GLOBIN RESULT:: DETECTED — AB
MICRO NUMBER:: 17368520
SPECIMEN QUALITY:: ADEQUATE

## 2024-01-28 LAB — HOUSE ACCOUNT TRACKING

## 2024-01-31 ENCOUNTER — Telehealth: Payer: Self-pay

## 2024-01-31 ENCOUNTER — Other Ambulatory Visit: Payer: Self-pay | Admitting: Nurse Practitioner

## 2024-01-31 DIAGNOSIS — K219 Gastro-esophageal reflux disease without esophagitis: Secondary | ICD-10-CM

## 2024-01-31 NOTE — Telephone Encounter (Signed)
 Copied from CRM #[id number]. Topic: Referral - Request for Referral >> Jan 31, 2024 11:41 AM Miquel SAILOR wrote: Did the patient discuss referral with their provider in the last year? Yes (If No - schedule appointment) (If Yes - send message)  Appointment offered? No  Needs call back when updated  [phone]  Type of order/referral and detailed reason for visit: Request to for referral on Dr. Kristie  Preference of office, provider, location: n/a  If referral order, have you been seen by this specialty before? Yes (If Yes, this issue or another issue? When? Where?  Can we respond through MyChart? No

## 2024-01-31 NOTE — Telephone Encounter (Signed)
 Referral was sent to [facility]

## 2024-01-31 NOTE — Telephone Encounter (Signed)
 Patient requesting referral for Dr.Mann please advise.

## 2024-02-16 ENCOUNTER — Ambulatory Visit: Admitting: Physician Assistant

## 2024-02-16 ENCOUNTER — Other Ambulatory Visit (INDEPENDENT_AMBULATORY_CARE_PROVIDER_SITE_OTHER): Payer: Self-pay

## 2024-02-16 DIAGNOSIS — S52502A Unspecified fracture of the lower end of left radius, initial encounter for closed fracture: Secondary | ICD-10-CM | POA: Diagnosis not present

## 2024-02-16 NOTE — Progress Notes (Signed)
 "  Office Visit Note   Patient: [name]           Date of Birth: October 15, 1940           MRN: [id number] Visit Date: 02/16/2024              Requested by: [name], NP [address] [place]. [place],  [place] [address] PCP: [name], NP   Assessment & Plan: Visit Diagnoses:  1. Closed fracture of distal end of left radius, unspecified fracture morphology, initial encounter     Plan: Impression is 6 weeks status post left distal radius and ulnar styloid fractures.  Patient is clinically and radiographically improving.  I would like to transition her to a Velcro wrist splint for the next 2 weeks.  She may remove this for showering.  I have also sent in a referral for hand therapy.  She will follow-up with us  in 3 to 4 weeks for repeat evaluation and x-rays of the left wrist.  Call with concerns or questions.  Follow-Up Instructions: Return in about 4 weeks (around 03/15/2024).   Orders:  Orders Placed This Encounter  Procedures   XR Wrist Complete Left   Ambulatory referral to Occupational Therapy   No orders of the defined types were placed in this encounter.     Procedures: No procedures performed   Clinical Data: No additional findings.   Subjective: Chief Complaint  Patient presents with   Left Wrist - Follow-up    Distal radius fracture    HPI patient is a pleasant 84 year old female who comes in today approximately 6 weeks status post left distal radius and ulnar styloid fractures.  She has been in a short arm cast and is doing well.  She denies any significant pain to the left wrist.  Review of Systems as detailed in HPI.  All others reviewed are negative.   Objective: Vital Signs: There were no vitals taken for this visit.  Physical Exam well-developed well-nourished female in no acute distress.  Alert and oriented x 3.  Ortho Exam examination of the left wrist out of the cast reveals very minimal to no swelling.  No tenderness to the  fracture site.  She does have slightly limited range of motion secondary to stiffness.  She is neurovascular intact distally.  Specialty Comments:  No specialty comments available.  Imaging: No results found.   PMFS History: Patient Active Problem List   Diagnosis Date Noted   Morbid obesity (HCC) 11/09/2023   MCI (mild cognitive impairment) 10/27/2023   Iron deficiency anemia secondary to inadequate dietary iron intake 10/27/2023   Sensorineural hearing loss, bilateral 04/27/2023   Impacted cerumen of both ears 04/27/2023   Other specified disorders of eustachian tube, right ear 04/27/2023   Closed comminuted intertrochanteric fracture of proximal femur, left, initial encounter (HCC) 10/31/2022   Accidental fall 10/31/2022   Chronic anticoagulation 10/31/2022   History of pulmonary embolism 10/31/2022   History of PAT (paroxysmal atrial tachycardia) 10/31/2022   Closed left hip fracture, initial encounter (HCC) 10/31/2022   Acute pancreatitis 06/13/2022   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/26/2022   Acquired hypercoagulable state 01/26/2022   Bilateral hearing loss 11/04/2020   Type 2 diabetes mellitus with stage 3b chronic kidney disease, without long-term current use of insulin  (HCC) 11/04/2020   RLS (restless legs syndrome) 11/04/2020   OSA (obstructive sleep apnea) 08/29/2019   Abnormal findings on diagnostic imaging of lung 08/29/2019   Minimal change  disease 02/13/2019   Pulmonary embolism (HCC) 02/07/2019   GERD without esophagitis 05/15/2017   Dyslipidemia 05/15/2017   Anxiety 05/15/2017   Constipation due to opioid therapy 05/15/2017   Accidental fall from chair, initial encounter 05/08/2017   Closed posttraumatic compression fracture of lumbar vertebra (HCC) 05/08/2017   Leukocytosis 05/08/2017   S/P right TKA 07/10/2014   S/P knee replacement 07/10/2014   COPD (chronic obstructive pulmonary disease) (HCC) 09/24/2013   Hypothyroidism  07/24/2012   Essential hypertension, benign 07/24/2012   Allergic rhinitis 07/24/2012   Mixed hyperlipidemia 07/24/2012   Past Medical History:  Diagnosis Date   Actinic keratosis    Arthritis    Asthma    Back injury 2019   Fracture   Basal cell carcinoma    COPD (chronic obstructive pulmonary disease) (HCC)    GERD (gastroesophageal reflux disease)    H/O blood clots 2020   Lung, Per [facility] New Patient Packet   H/O mammogram 2022   Per [facility]   Heart murmur    hx of in childhood    High cholesterol    Hyperlipidemia    Hypertension    Hypothyroidism    Kidney disease    Per [facility] New Patient Packet   MCNS (minimal change nephrotic syndrome)    OSA (obstructive sleep apnea) 08/29/2019   Osteopenia    Shortness of breath dyspnea    on exertion    Squamous cell carcinoma    Thyroid  disease    Per [facility] Patient Packet    Family History  Problem Relation Age of Onset   COPD Mother        Husband Smoked    Lymphoma Father    Hypertension Sister    Scoliosis Daughter    Stroke Maternal Grandfather    Rheumatic fever Paternal Grandmother    Heart disease Paternal Grandfather     Past Surgical History:  Procedure Laterality Date   ABDOMINAL HYSTERECTOMY     APPENDECTOMY     BILATERAL SALPINGOOPHORECTOMY     Ovarian Cysts    CHOLECYSTECTOMY N/A 06/17/2022   Procedure: LAPAROSCOPIC CHOLECYSTECTOMY;  Surgeon: Dasie Leonor CROME, MD;  Location: [facility];  Service: General;  Laterality: N/A;   COLONOSCOPY  2001   Dr.Mann, Per [facility] New Patient Packet   CONVERSION TO TOTAL KNEE Right 07/10/2014   Procedure: RIGHT CONVERSION OF PARTIAL KNEE TO TOTAL KNEE;  Surgeon: Donnice Car, MD;  Location: [facility];  Service: Orthopedics;  Laterality: Right;   ENDOSCOPIC RETROGRADE CHOLANGIOPANCREATOGRAPHY (ERCP) WITH PROPOFOL  N/A 06/15/2022   Procedure: ENDOSCOPIC RETROGRADE CHOLANGIOPANCREATOGRAPHY (ERCP) WITH PROPOFOL ;  Surgeon: Rosalie Kitchens, MD;  [facility]: [facility];  Service:  Gastroenterology;  Laterality: N/A;   INTRAMEDULLARY (IM) NAIL INTERTROCHANTERIC Left 11/02/2022   Procedure: INTRAMEDULLARY NAILING OF LEFT FEMUR;  Surgeon: Jerri Kay HERO, MD;  Location: [facility];  Service: Orthopedics;  Laterality: Left;   IR ANGIOGRAM PULMONARY BILATERAL SELECTIVE  02/08/2019   IR ANGIOGRAM SELECTIVE EACH ADDITIONAL VESSEL  02/08/2019   IR ANGIOGRAM SELECTIVE EACH ADDITIONAL VESSEL  02/08/2019   IR INFUSION THROMBOL ARTERIAL INITIAL (MS)  02/08/2019   IR INFUSION THROMBOL ARTERIAL INITIAL (MS)  02/08/2019   IR THROMB F/U EVAL ART/VEN FINAL DAY (MS)  02/09/2019   IR US  GUIDE VASC ACCESS RIGHT  02/08/2019   MEDIAL PARTIAL KNEE REPLACEMENT Bilateral    MOHS SURGERY     x 2   REMOVAL OF STONES  06/15/2022   Procedure: REMOVAL OF STONES;  Surgeon: Magod,  Oliva, MD;  Location: [facility];  Service: Gastroenterology;;   RENAL BIOPSY     x 2   REPLACEMENT TOTAL KNEE  2012   Per [facility] New Patient Packet   ROTATOR CUFF REPAIR Left 2008   SPHINCTEROTOMY  06/15/2022   Procedure: SPHINCTEROTOMY;  Surgeon: Rosalie Oliva, MD;  [facility]: [facility];  Service: Gastroenterology;;   Social History   Occupational History   Occupation: TEACHER     Employer: [employer]    Comment: RETIRED   Tobacco Use   Smoking status: Former    Current packs/day: 0.00    Average packs/day: 1.5 packs/day for 25.0 years (37.5 ttl pk-yrs)    Types: Cigarettes    Start date: 01/13/1948    Quit date: 01/12/1973    Years since quitting: 51.1   Smokeless tobacco: Never  Vaping Use   Vaping status: Never Used  Substance and Sexual Activity   Alcohol  use: Yes    Alcohol /week: 0.5 standard drinks of alcohol     Types: 1 Standard drinks or equivalent per week    Comment: She occasionally drinks a glass of wine.     Drug use: No   Sexual activity: Not Currently    Partners: Male        "

## 2024-02-22 ENCOUNTER — Other Ambulatory Visit

## 2024-02-22 NOTE — Therapy (Signed)
 " OUTPATIENT OCCUPATIONAL THERAPY ORTHO EVALUATION  Patient Name: Geniene List MRN: [id number] DOB:27-Oct-1940, 84 y.o., female Today's Date: 02/23/2024  PCP: Caro ALF NP REFERRING PROVIDER:  Jule Ronal CROME, PA-C    END OF SESSION:  OT End of Session - 02/23/24 1345     Visit Number 1    Number of Visits 5    Date for Recertification  03/24/24    Authorization Type [employer]    OT Start Time 1347    OT Stop Time 1410    OT Time Calculation (min) 23 min    Activity Tolerance Patient tolerated treatment well;No increased pain;Patient limited by fatigue;Patient limited by pain    Behavior During Therapy [facility] for tasks assessed/performed          Past Medical History:  Diagnosis Date   Actinic keratosis    Arthritis    Asthma    Back injury 2019   Fracture   Basal cell carcinoma    COPD (chronic obstructive pulmonary disease) (HCC)    GERD (gastroesophageal reflux disease)    H/O blood clots 2020   Lung, Per [facility]   H/O mammogram 2022   Per [facility]   Heart murmur    hx of in childhood    High cholesterol    Hyperlipidemia    Hypertension    Hypothyroidism    Kidney disease    Per [facility] New Patient Packet   MCNS (minimal change nephrotic syndrome)    OSA (obstructive sleep apnea) 08/29/2019   Osteopenia    Shortness of breath dyspnea    on exertion    Squamous cell carcinoma    Thyroid  disease    Per [facility] New Patient Packet   Past Surgical History:  Procedure Laterality Date   ABDOMINAL HYSTERECTOMY     APPENDECTOMY     BILATERAL SALPINGOOPHORECTOMY     Ovarian Cysts    CHOLECYSTECTOMY N/A 06/17/2022   Procedure: LAPAROSCOPIC CHOLECYSTECTOMY;  Surgeon: Dasie Leonor CROME, MD;  [facility];  Service: General;  Laterality: N/A;   COLONOSCOPY  2001   Dr.Mann, Per [facility] New Patient Packet   CONVERSION TO TOTAL KNEE Right 07/10/2014   Procedure: RIGHT CONVERSION OF PARTIAL KNEE TO TOTAL KNEE;  Surgeon: Donnice Car, MD;  [facility]: [facility];  Service: Orthopedics;  Laterality: Right;   ENDOSCOPIC RETROGRADE CHOLANGIOPANCREATOGRAPHY (ERCP) WITH PROPOFOL  N/A 06/15/2022   Procedure: ENDOSCOPIC RETROGRADE CHOLANGIOPANCREATOGRAPHY (ERCP) WITH PROPOFOL ;  Surgeon: Rosalie Kitchens, MD;  [facility];  Service: Gastroenterology;  Laterality: N/A;   INTRAMEDULLARY (IM) NAIL INTERTROCHANTERIC Left 11/02/2022   Procedure: INTRAMEDULLARY NAILING OF LEFT FEMUR;  Surgeon: Jerri Kay HERO, MD;  [facility];  Service: Orthopedics;  Laterality: Left;   IR ANGIOGRAM PULMONARY BILATERAL SELECTIVE  02/08/2019   IR ANGIOGRAM SELECTIVE EACH ADDITIONAL VESSEL  02/08/2019   IR ANGIOGRAM SELECTIVE EACH ADDITIONAL VESSEL  02/08/2019   IR INFUSION THROMBOL ARTERIAL INITIAL (MS)  02/08/2019   IR INFUSION THROMBOL ARTERIAL INITIAL (MS)  02/08/2019   IR THROMB F/U EVAL ART/VEN FINAL DAY (MS)  02/09/2019   IR US  GUIDE VASC ACCESS RIGHT  02/08/2019   MEDIAL PARTIAL KNEE REPLACEMENT Bilateral    MOHS SURGERY     x 2   REMOVAL OF STONES  06/15/2022   Procedure: REMOVAL OF STONES;  Surgeon: Rosalie Kitchens, MD;  [facility]: [facility];  Service: Gastroenterology;;   RENAL BIOPSY     x 2   REPLACEMENT TOTAL KNEE  2012   Per [facility] New Patient Packet   ROTATOR CUFF REPAIR Left 2008   SPHINCTEROTOMY  06/15/2022   Procedure: SPHINCTEROTOMY;  Surgeon: Rosalie Kitchens, MD;  Location: [facility];  Service: Gastroenterology;;   Patient Active Problem List   Diagnosis Date Noted   Morbid obesity (HCC) 11/09/2023   MCI (mild cognitive impairment) 10/27/2023   Iron deficiency anemia secondary to inadequate dietary iron intake 10/27/2023   Sensorineural hearing loss, bilateral 04/27/2023   Impacted cerumen of both ears 04/27/2023   Other specified disorders of eustachian tube, right ear 04/27/2023   Closed comminuted intertrochanteric fracture of proximal femur, left, initial encounter (HCC) 10/31/2022   Accidental fall 10/31/2022   Chronic  anticoagulation 10/31/2022   History of pulmonary embolism 10/31/2022   History of PAT (paroxysmal atrial tachycardia) 10/31/2022   Closed left hip fracture, initial encounter (HCC) 10/31/2022   Acute pancreatitis 06/13/2022   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/26/2022   Acquired hypercoagulable state 01/26/2022   Bilateral hearing loss 11/04/2020   Type 2 diabetes mellitus with stage 3b chronic kidney disease, without long-term current use of insulin  (HCC) 11/04/2020   RLS (restless legs syndrome) 11/04/2020   OSA (obstructive sleep apnea) 08/29/2019   Abnormal findings on diagnostic imaging of lung 08/29/2019   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   Pulmonary embolism (HCC) 02/07/2019   GERD without esophagitis 05/15/2017   Dyslipidemia 05/15/2017   Anxiety 05/15/2017   Constipation due to opioid therapy 05/15/2017   Accidental fall from chair, initial encounter 05/08/2017   Closed posttraumatic compression fracture of lumbar vertebra (HCC) 05/08/2017   Leukocytosis 05/08/2017   S/P right TKA 07/10/2014   S/P knee replacement 07/10/2014   COPD (chronic obstructive pulmonary disease) (HCC) 09/24/2013   Hypothyroidism 07/24/2012   Essential hypertension, benign 07/24/2012   Allergic rhinitis 07/24/2012   Mixed hyperlipidemia 07/24/2012    ONSET DATE: DOI 01/05/24  REFERRING DIAG: D47.497J (ICD-10-CM) - Closed fracture of distal end of left radius, unspecified fracture morphology, initial encounter   THERAPY DIAG:  Stiffness of left wrist, not elsewhere classified - Plan: Ot plan of care cert/re-cert  Closed fracture of distal end of left radius, unspecified fracture morphology, initial encounter - Plan: Ot plan of care cert/re-cert  Localized edema - Plan: Ot plan of care cert/re-cert  Muscle weakness (generalized) - Plan: Ot plan of care cert/re-cert  Rationale for Evaluation and Treatment: Rehabilitation  SUBJECTIVE:   SUBJECTIVE STATEMENT: She is  now ~7 weeks s/p Lt DRF and ulnar styloid fx managed conservatively.   She states her prefabricated wrist brace is working very well, she only has mild to moderate pain when moving her wrist.  At rest she has no pain, no paresthesia, she seems fairly confident.  She does use a cane to help with ambulation in the right arm   PERTINENT HISTORY: Fall approximately 7 weeks ago breaking distal radius and ulnar styloid  PRECAUTIONS: None  RED FLAGS: None   WEIGHT BEARING RESTRICTIONS: Yes limit weightbearing to approximately 5 pounds for the next 2 weeks  PAIN:  Are you having pain? Yes: NPRS scale: 3-4/10 with gentle motion Pain location: Lt wrist  Pain description: Soreness Aggravating factors: Aggressive motion or weightbearing Relieving factors: Rest  FALLS: Has patient fallen in last 6 months? Yes. Number of falls 1-this injury, not considered a significant fall risk  PLOF: Independent  PATIENT GOALS: To safely improve the use of her left wrist and arm  NEXT MD VISIT: As needed  OBJECTIVE: (All objective assessments below are from initial evaluation on: 02/23/24 unless otherwise specified.)   HAND DOMINANCE: Right  ADLs: Overall ADLs: States decreased ability to grab, hold household objects, pain and difficulty to open containers, perform FMS tasks (manipulate fasteners on clothing), mild dressing and bathing problems as well.    FUNCTIONAL OUTCOME MEASURES: Eval: Patient Specific Functional Scale: 7.5 (Pulling up pants, putting on seatbelt, lifting objects)  (Higher Score  =  Better Ability for the Selected Tasks)      UPPER EXTREMITY ROM     Shoulder to Wrist AROM Right eval Left eval  Elbow flexion    Elbow extension    Forearm supination  68  Forearm pronation   85  Wrist flexion 70 44  Wrist extension 65 52  Wrist ulnar deviation 25 26  Wrist radial deviation 40 14  Functional dart thrower's motion (F-DTM) in ulnar flexion    F-DTM in radial extension      (Blank rows = not tested)   Hand AROM Left eval  Full Fist Ability (or Gap to Distal Palmar Crease) Full fist   Thumb Opposition  (Kapandji Scale)  9/10 bil  Thumb MCP (0-60)   Thumb IP (0-80)   Thumb Radial Abduction Span    Thumb Palmar Abduction Span    (Blank rows = not tested)   UPPER EXTREMITY MMT:    Eval:  NT at eval due to recent and still healing injuries. Will be tested when appropriate.   MMT Left TBD  Elbow flexion   Elbow extension   Forearm supination   Forearm pronation   Wrist flexion   Wrist extension   Wrist ulnar deviation   Wrist radial deviation   (Blank rows = not tested)  HAND FUNCTION: Eval: Observed weakness in affected Lt hand.  Grip strength Right: 33 lbs, Left: 24 lbs   COORDINATION: Eval: Mild gross motor coordination impairments noted due to stiffness of the left wrist now.  Expected to improve with HEP and recommendations  SENSATION: Eval:  Light touch intact today, no complaints of paresthesia  EDEMA:   Eval: Only very mildly swollen in the left wrist compared to the right today  COGNITION: Eval: Overall cognitive status: WFL for evaluation today   OBSERVATIONS:   Eval: She is doing fantastic about 7 weeks since injury.  She has some mild to moderate loss of motion, slight weakness of grip strength, but no significant pain.   Left distal radius fracture healing nonop  TODAY'S TREATMENT:  Post-evaluation treatment:    She was recommended to do no weightbearing more than 2 to 5 pounds over the next 2 weeks.  She should be wearing her brace at all times except for hygiene, exercises 4-6 times a day, and periods of rest while seated.  She was also given the following home exercise program with passive range of motion as well as active range of motion and light grip training to perform to improve her motion and mobility.  She should not be doing significant strengthening yet.  She states understanding all directions and tolerates  each exercise well   Exercises - Forearm Supination Stretch  - 3-4 x daily - 3-5 reps - 15 sec hold - Forearm Pronation Stretch  - 3-4 x daily - 3-5 reps - 15 sec hold - Turn [employer] Facing Up & Down  - 4-6 x daily - 10-15 reps - Wrist Flexion Stretch  - 4 x daily - 3-5 reps - 15 sec hold -  Wrist Prayer Stretch  - 4 x daily - 3-5 reps - 15 sec hold - Bend and Pull Back Wrist SLOWLY  - 4 x daily - 10-15 reps - Windshield Wipers   - 4 x daily - 10-15 reps - Towel Roll Grip with Forearm in Neutral  - 3 x daily - 5 reps - 10 sec hold  PATIENT EDUCATION: Education details: See tx section above for details  Person educated: Patient Education method: Verbal Instruction, Teach back, Handouts  Education comprehension: States and demonstrates understanding, Additional Education required    HOME EXERCISE PROGRAM: Access Code: [email or web address] URL: [email or web address] Date: 02/23/2024 Prepared by: Melvenia Ada   GOALS: Goals reviewed with patient? Yes   SHORT TERM GOALS: (STG required if POC>30 days) Target Date: 02/23/2024   Pt will demo/state understanding of initial HEP to improve pain levels and prerequisite motion. Goal status: Goal met   LONG TERM GOALS: Target Date: 03/24/2024  Pt will improve functional ability by decreased impairment per PSFS assessment from 7.5 to 8.5 or better, for better quality of life. Goal status: INITIAL  2.  Pt will improve grip strength in left hand from 24 lbs to at least 30 lbs for functional use at home and in IADLs. Goal status: INITIAL  3.  Pt will improve A/ROM in left wrist flexion/extension from 40/52 degrees respectively to at least 60 degrees each, to have functional motion for tasks like reach and grasp.  Goal status: INITIAL  4.  Pt will improve strength in left wrist flexion/extension from apparent 3 -/5 MMT to at least 4+/5 MMT to have increased functional ability to carry out selfcare and higher-level homecare tasks with  less difficulty. Goal status: INITIAL  5.  Pt will decrease pain with motion from 3-4/10 to 1/10 or better to have better sleep and occupational participation in daily roles. Goal status: INITIAL   ASSESSMENT:  CLINICAL IMPRESSION: Patient is a 84 y.o. female who was seen today for occupational therapy evaluation for stiffness, weakness, decreased functional ability in the left hand and arm after distal radius and ulnar fractures.  She was doing so well so we decided that she likely will only need a few visits of therapy biweekly.  It was explained that she could come in sooner if she is having any unexpected pain or problems.  The patient will benefit from outpatient occupational therapy to decrease symptoms, improve functional upper extremity use, and increase quality of life.  PERFORMANCE DEFICITS: in functional skills including IADLs, ROM, strength, pain, Gross motor control, endurance, and UE functional use, cognitive skills including problem solving and safety awareness, and psychosocial skills including coping strategies, environmental adaptation, habits, and routines and behaviors.   IMPAIRMENTS: are limiting patient from ADLs, IADLs, rest and sleep, and leisure.   COMORBIDITIES: may have co-morbidities  that affects occupational performance. Patient will benefit from skilled OT to address above impairments and improve overall function.  MODIFICATION OR ASSISTANCE TO COMPLETE EVALUATION: No modification of tasks or assist necessary to complete an evaluation.  OT OCCUPATIONAL PROFILE AND HISTORY: Problem focused assessment: Including review of records relating to presenting problem.  CLINICAL DECISION MAKING: LOW - limited treatment options, no task modification necessary  REHAB POTENTIAL: Excellent  EVALUATION COMPLEXITY: Low      PLAN:  OT FREQUENCY: 1-2x/week  OT DURATION: 4 weeks through 03/24/2024 and up to 5 total visits as needed   PLANNED INTERVENTIONS: 97535 self  care/ADL training, 02889 therapeutic exercise, 97530 therapeutic activity, 97112 neuromuscular re-education, 97140 manual  therapy, 97035 ultrasound, 02967 electrical stimulation (manual), [id number] Orthotic Initial, [id number] Orthotic/Prosthetic subsequent, compression bandaging, Dry needling, energy conservation, coping strategies training, and patient/family education  RECOMMENDED OTHER SERVICES: none now    CONSULTED AND AGREED WITH PLAN OF CARE: Patient  PLAN FOR NEXT SESSION:   Review initial HEP and recommendations in approximately 2 weeks, we will upgrade to strengthening of the wrist at that time.   Melvenia Ada, OTR/L, CHT  02/23/2024, 2:23 PM    Referring diagnosis? S52.502A (ICD-10-CM) - Closed fracture of distal end of left radius, unspecified fracture morphology, initial encounter Treatment diagnosis? (if different than referring diagnosis) M25.632 , M62.81 What was this (referring dx) caused by? []  Surgery [x]  Fall []  Ongoing issue []  Arthritis []  Other: ____________  Laterality: []  Rt [x]  Lt []  Both  Check all possible CPT codes:  *CHOOSE 10 OR LESS*    See Planned Interventions listed in the Plan section of the Evaluation.   "

## 2024-02-23 ENCOUNTER — Encounter: Payer: Self-pay | Admitting: Rehabilitative and Restorative Service Providers"

## 2024-02-23 ENCOUNTER — Ambulatory Visit: Admitting: Rehabilitative and Restorative Service Providers"

## 2024-02-23 DIAGNOSIS — R600 Localized edema: Secondary | ICD-10-CM

## 2024-02-23 DIAGNOSIS — S52502A Unspecified fracture of the lower end of left radius, initial encounter for closed fracture: Secondary | ICD-10-CM | POA: Diagnosis not present

## 2024-02-23 DIAGNOSIS — M25632 Stiffness of left wrist, not elsewhere classified: Secondary | ICD-10-CM

## 2024-02-23 DIAGNOSIS — M6281 Muscle weakness (generalized): Secondary | ICD-10-CM

## 2024-02-26 NOTE — Progress Notes (Unsigned)
 "   [facility] [phone]   [phone]  CONSULT NOTE  REFERRING PHYSICIAN: Harlene An  REASON FOR CONSULTATION:  IDA  HPI [name] is a 84 y.o. female with past medical history significant for hypertension, pulmonary embolism, heart failure, COPD, OSA, GERD, pancreatitis, diabetes, hypothyroidism, fracture, and hyperlipidemia is referred to the clinic for anemia.  The patient had a follow-up visit with her PCP on 01/21/2024 for management of her chronic issues.  Of note the patient does have chronic kidney disease and follows with Dr.***From ***. She is on Prograf  for her kidney condition.   She has been taking iron supplements ***for ***.  Despite this she continued to have IDA. Her iron was low at 35. Her saturation was low at 11, and her ferritin low at 13. Her Hbg was low at 11.1.  Therefore fecal occult stool cards were ordered which showed ***.  She was referred to gastroenterology.  She is seeing Dr. FERNAND  Colonoscopy and endoscopy? Her stool cards were positive.   Of note she is on a blood thinner with Eliquis  due to her history of PE.     The oldest labs available to me are from 2015. She did have anemia on 2016 with Hbg on 9.0. She also had mild anemia in 2019/2020.She also had anemia in 2021-2022.  Her Hbg was normal in 2023-2024.She had some intermittent anemia in 2024. The lowest Hbg was in the 7's in September 2024   She is unsure when she was first told she was anemic.     The patient has never required any IV iron before. ***She has never required a blood transfusion before.    Symptoms she reports ***cold intolerance and generalized weakness.  She has ***occasional lightheadedness.  She denies being ice chips. She may occasionally have mild dyspnea on exertion but not frequently.  She denies any abnormal bleeding or bruising including epistaxis, gingival bleeding, hemoptysis, hematemesis, melena, or hematochezia.   She denies any hemorrhoids.     She denies any liver disease or any history of bariatric surgery.  She denies any dietary restrictions except she ***.  Otherwise she does eat other types of red meat.  Denies any bariatric surgery.      HPI  Past Medical History:  Diagnosis Date   Actinic keratosis    Arthritis    Asthma    Back injury 2019   Fracture   Basal cell carcinoma    COPD (chronic obstructive pulmonary disease) (HCC)    GERD (gastroesophageal reflux disease)    H/O blood clots 2020   Lung, Per [facility] New Patient Packet   H/O mammogram 2022   Per [facility] New Patient Packet   Heart murmur    hx of in childhood    High cholesterol    Hyperlipidemia    Hypertension    Hypothyroidism    Kidney disease    Per [facility] New Patient Packet   MCNS (minimal change nephrotic syndrome)    OSA (obstructive sleep apnea) 08/29/2019   Osteopenia    Shortness of breath dyspnea    on exertion    Squamous cell carcinoma    Thyroid  disease    Per [facility] New Patient Packet    Past Surgical History:  Procedure Laterality Date   ABDOMINAL HYSTERECTOMY     APPENDECTOMY     BILATERAL SALPINGOOPHORECTOMY     Ovarian Cysts    CHOLECYSTECTOMY N/A 06/17/2022   Procedure: LAPAROSCOPIC CHOLECYSTECTOMY;  Surgeon:  Dasie Leonor CROME, MD;  Location: [facility];  Service: General;  Laterality: N/A;   COLONOSCOPY  2001   Dr.Mann, Per [facility] New Patient Packet   CONVERSION TO TOTAL KNEE Right 07/10/2014   Procedure: RIGHT CONVERSION OF PARTIAL KNEE TO TOTAL KNEE;  Surgeon: Donnice Car, MD;  Location: [facility];  Service: Orthopedics;  Laterality: Right;   ENDOSCOPIC RETROGRADE CHOLANGIOPANCREATOGRAPHY (ERCP) WITH PROPOFOL  N/A 06/15/2022   Procedure: ENDOSCOPIC RETROGRADE CHOLANGIOPANCREATOGRAPHY (ERCP) WITH PROPOFOL ;  Surgeon: Rosalie Kitchens, MD;  [facility];  Service: Gastroenterology;  Laterality: N/A;   INTRAMEDULLARY (IM) NAIL INTERTROCHANTERIC Left 11/02/2022   Procedure: INTRAMEDULLARY NAILING OF LEFT  FEMUR;  Surgeon: Jerri Kay HERO, MD;  [facility]: [facility];  Service: Orthopedics;  Laterality: Left;   IR ANGIOGRAM PULMONARY BILATERAL SELECTIVE  02/08/2019   IR ANGIOGRAM SELECTIVE EACH ADDITIONAL VESSEL  02/08/2019   IR ANGIOGRAM SELECTIVE EACH ADDITIONAL VESSEL  02/08/2019   IR INFUSION THROMBOL ARTERIAL INITIAL (MS)  02/08/2019   IR INFUSION THROMBOL ARTERIAL INITIAL (MS)  02/08/2019   IR THROMB F/U EVAL ART/VEN FINAL DAY (MS)  02/09/2019   IR US  GUIDE VASC ACCESS RIGHT  02/08/2019   MEDIAL PARTIAL KNEE REPLACEMENT Bilateral    MOHS SURGERY     x 2   REMOVAL OF STONES  06/15/2022   Procedure: REMOVAL OF STONES;  Surgeon: Rosalie Kitchens, MD;  [facility];  Service: Gastroenterology;;   RENAL BIOPSY     x 2   REPLACEMENT TOTAL KNEE  2012   Per [facility] New Patient Packet   ROTATOR CUFF REPAIR Left 2008   SPHINCTEROTOMY  06/15/2022   Procedure: SPHINCTEROTOMY;  Surgeon: Rosalie Kitchens, MD;  [facility];  Service: Gastroenterology;;    Family History  Problem Relation Age of Onset   COPD Mother        Husband Smoked    Lymphoma Father    Hypertension Sister    Scoliosis Daughter    Stroke Maternal Grandfather    Rheumatic fever Paternal Grandmother    Heart disease Paternal Grandfather     Social History Social History[1]  Allergies[2]  Current Outpatient Medications  Medication Sig Dispense Refill   albuterol  (VENTOLIN  HFA) 108 (90 Base) MCG/ACT inhaler Inhale 1-2 puffs into the lungs every 6 (six) hours as needed for wheezing or shortness of breath. 6.7 g 2   Azelastine  HCl 137 MCG/SPRAY SOLN PLACE 2 SPRAYS INTO BOTH NOSTRILS 2 (TWO) TIMES DAILY. USE IN EACH NOSTRIL AS DIRECTED 90 mL 1   citalopram  (CELEXA ) 20 MG tablet TAKE 1 TABLET (20 MG TOTAL) BY MOUTH AT BEDTIME. DX F41.9 90 tablet 3   denosumab  (PROLIA ) 60 MG/ML SOSY injection Inject 60 mg into the skin every 6 (six) months. 1 mL 1   docusate sodium  (COLACE) 100 MG capsule Take 1 capsule (100 mg total) by mouth  2 (two) times daily. (Patient taking differently: Take 100 mg by mouth daily as needed for moderate constipation.) 10 capsule 0   donepezil  (ARICEPT ) 10 MG tablet TAKE 1 TABLET BY MOUTH EVERYDAY AT BEDTIME 90 tablet 3   ELIQUIS  5 MG TABS tablet TAKE 1 TABLET BY MOUTH TWICE A DAY 60 tablet 5   ferrous sulfate  325 (65 FE) MG EC tablet TAKE 1 TABLET BY MOUTH EVERY DAY WITH BREAKFAST 90 tablet 3   fluticasone  (FLONASE ) 50 MCG/ACT nasal spray SPRAY 1 SPRAY INTO EACH NOSTRIL DAILY 48 mL 2   Fluticasone -Umeclidin-Vilant (TRELEGY ELLIPTA ) 100-62.5-25 MCG/ACT AEPB Inhale 1 puff into the lungs  daily. 60 each 11   ipratropium (ATROVENT ) 0.06 % nasal spray Place 2 sprays into both nostrils 3 (three) times daily. 15 mL 12   levothyroxine  (SYNTHROID ) 88 MCG tablet TAKE 1 TABLET BY MOUTH EVERY DAY IN THE MORNING 90 tablet 0   losartan  (COZAAR ) 100 MG tablet TAKE 1 TABLET BY MOUTH EVERY DAY 90 tablet 1   Melatonin 10 MG TABS Take 10 mg by mouth at bedtime.     metFORMIN  (GLUCOPHAGE ) 500 MG tablet Take 1 tablet (500 mg total) by mouth daily with supper. 90 tablet 1   metoprolol  tartrate (LOPRESSOR ) 25 MG tablet Take 1 tablet (25 mg total) by mouth 2 (two) times daily. 180 tablet 3   Multiple Vitamin (MULTIVITAMIN WITH MINERALS) TABS tablet Take 1 tablet by mouth daily with breakfast.     pantoprazole  (PROTONIX ) 40 MG tablet TAKE 1 TABLET BY MOUTH EVERYDAY AT BEDTIME 90 tablet 3   rOPINIRole  (REQUIP ) 1 MG tablet Take 1 tablet (1 mg total) by mouth at bedtime. 90 tablet 3   rosuvastatin  (CRESTOR ) 20 MG tablet TAKE 1 TABLET BY MOUTH EVERY DAY 90 tablet 3   tacrolimus  (PROGRAF ) 1 MG capsule Take 1 mg by mouth in the morning and at bedtime.     TYLENOL  325 MG CAPS Take 325-650 mg by mouth every 6 (six) hours as needed (for pain or headaches).     Current Facility-Administered Medications  Medication Dose Route Frequency Provider Last Rate Last Admin   denosumab  (PROLIA ) injection 60 mg  60 mg Subcutaneous Once  Eubanks, Jessica K, NP        REVIEW OF SYSTEMS:   Review of Systems  Constitutional: Negative for appetite change, chills, fatigue, fever and unexpected weight change.  HENT:   Negative for mouth sores, nosebleeds, sore throat and trouble swallowing.   Eyes: Negative for eye problems and icterus.  Respiratory: Negative for cough, hemoptysis, shortness of breath and wheezing.   Cardiovascular: Negative for chest pain and leg swelling.  Gastrointestinal: Negative for abdominal pain, constipation, diarrhea, nausea and vomiting.  Genitourinary: Negative for bladder incontinence, difficulty urinating, dysuria, frequency and hematuria.   Musculoskeletal: Negative for back pain, gait problem, neck pain and neck stiffness.  Skin: Negative for itching and rash.  Neurological: Negative for dizziness, extremity weakness, gait problem, headaches, light-headedness and seizures.  Hematological: Negative for adenopathy. Does not bruise/bleed easily.  Psychiatric/Behavioral: Negative for confusion, depression and sleep disturbance. The patient is not nervous/anxious.     PHYSICAL EXAMINATION:  There were no vitals taken for this visit.  ECOG PERFORMANCE STATUS: {CHL ONC ECOG [id number]  Physical Exam  Constitutional: Oriented to person, place, and time and well-developed, well-nourished, and in no distress. No distress.  HENT:  Head: Normocephalic and atraumatic.  Mouth/Throat: Oropharynx is clear and moist. No oropharyngeal exudate.  Eyes: Conjunctivae are normal. Right eye exhibits no discharge. Left eye exhibits no discharge. No scleral icterus.  Neck: Normal range of motion. Neck supple.  Cardiovascular: Normal rate, regular rhythm, normal heart sounds and intact distal pulses.   Pulmonary/Chest: Effort normal and breath sounds normal. No respiratory distress. No wheezes. No rales.  Abdominal: Soft. Bowel sounds are normal. Exhibits no distension and no mass. There is no tenderness.   Musculoskeletal: Normal range of motion. Exhibits no edema.  Lymphadenopathy:    No cervical adenopathy.  Neurological: Alert and oriented to person, place, and time. Exhibits normal muscle tone. Gait normal. Coordination normal.  Skin: Skin is warm and dry. No rash noted.  Not diaphoretic. No erythema. No pallor.  Psychiatric: Mood, memory and judgment normal.  Vitals reviewed.  LABORATORY DATA: Lab Results  Component Value Date   WBC 10.1 01/19/2024   HGB 11.1 (L) 01/19/2024   HCT 37.4 01/19/2024   MCV 85.4 01/19/2024   PLT 367 01/19/2024      Chemistry      Component Value Date/Time   NA 140 01/19/2024 0944   K 4.3 01/19/2024 0944   CL 101 01/19/2024 0944   CO2 31 01/19/2024 0944   BUN 26 (H) 01/19/2024 0944   CREATININE 1.17 (H) 01/19/2024 0944      Component Value Date/Time   CALCIUM  9.0 01/19/2024 0944   ALKPHOS 34 (L) 11/05/2022 0417   AST 16 01/19/2024 0944   ALT 20 01/19/2024 0944   BILITOT 0.5 01/19/2024 0944       RADIOGRAPHIC STUDIES: XR Wrist Complete Left Result Date: 02/16/2024 X-rays demonstrate consolidation of the fracture sites.   ASSESSMENT: This is a very pleasant 84 year old female referred to the clinic for iron deficiency anemia.   The patient was seen by Dr. Sherrod today.  She had a repeat CBC, CMP, iron studies, ferritin, SPEP, B12, and folate.   Her labs today show mild anemia with a hemoglobin of ***.    We will arrange IV iron infusions with venofer 300 mg weekly x3 at the [facility].    We would recommend that she take ferrous sulfate  p.o. with vitamin C daily.    She was encouraged to increase her dietary intake of iron rich food.  She is advised to take her iron supplement with vitamin C.    She will continue to follow with GI for EGD and colonoscopy   We will see her back for labs and a follow-up visit in 3 months.  The patient voices understanding of current disease status and treatment options and is  in agreement with the current care plan.  All questions were answered. The patient knows to call the clinic with any problems, questions or concerns. We can certainly see the patient much sooner if necessary.  Thank you so much for allowing me to participate in the care of [name]. I will continue to follow up the patient with you and assist in her care.  I spent {CHL ONC TIME VISIT - DTPQU:[id number]} counseling the patient face to face. The total time spent in the appointment was {CHL ONC TIME VISIT - DTPQU:[id number]}.  Disclaimer: This note was dictated with voice recognition software. Similar sounding words can inadvertently be transcribed and may not be corrected upon review.   Rozina Pointer L Zidane Renner February 26, 2024, 10:36 AM       [1]  Social History Tobacco Use   Smoking status: Former    Current packs/day: 0.00    Average packs/day: 1.5 packs/day for 25.0 years (37.5 ttl pk-yrs)    Types: Cigarettes    Start date: 01/13/1948    Quit date: 01/12/1973    Years since quitting: 51.1   Smokeless tobacco: Never  Vaping Use   Vaping status: Never Used  Substance Use Topics   Alcohol  use: Yes    Alcohol /week: 0.5 standard drinks of alcohol     Types: 1 Standard drinks or equivalent per week    Comment: She occasionally drinks a glass of wine.     Drug use: No  [2] No Known Allergies  "

## 2024-02-28 ENCOUNTER — Inpatient Hospital Stay: Attending: Physician Assistant | Admitting: Physician Assistant

## 2024-02-28 ENCOUNTER — Inpatient Hospital Stay

## 2024-02-28 ENCOUNTER — Other Ambulatory Visit: Payer: Self-pay | Admitting: Physician Assistant

## 2024-02-28 ENCOUNTER — Telehealth: Payer: Self-pay

## 2024-02-28 VITALS — BP 137/61 | HR 62 | Temp 97.6°F | Resp 18 | Ht 66.0 in | Wt 232.9 lb

## 2024-02-28 DIAGNOSIS — Z87891 Personal history of nicotine dependence: Secondary | ICD-10-CM | POA: Insufficient documentation

## 2024-02-28 DIAGNOSIS — Z86711 Personal history of pulmonary embolism: Secondary | ICD-10-CM | POA: Diagnosis not present

## 2024-02-28 DIAGNOSIS — Z807 Family history of other malignant neoplasms of lymphoid, hematopoietic and related tissues: Secondary | ICD-10-CM | POA: Diagnosis not present

## 2024-02-28 DIAGNOSIS — D509 Iron deficiency anemia, unspecified: Secondary | ICD-10-CM | POA: Diagnosis not present

## 2024-02-28 DIAGNOSIS — D508 Other iron deficiency anemias: Secondary | ICD-10-CM

## 2024-02-28 LAB — VITAMIN B12: Vitamin B-12: 512 pg/mL (ref 180–914)

## 2024-02-28 LAB — CMP (CANCER CENTER ONLY)
ALT: 15 U/L (ref 0–44)
AST: 22 U/L (ref 15–41)
Albumin: 4 g/dL (ref 3.5–5.0)
Alkaline Phosphatase: 48 U/L (ref 38–126)
Anion gap: 9 (ref 5–15)
BUN: 17 mg/dL (ref 8–23)
CO2: 28 mmol/L (ref 22–32)
Calcium: 9.2 mg/dL (ref 8.9–10.3)
Chloride: 104 mmol/L (ref 98–111)
Creatinine: 1.02 mg/dL — ABNORMAL HIGH (ref 0.44–1.00)
GFR, Estimated: 54 mL/min — ABNORMAL LOW
Glucose, Bld: 90 mg/dL (ref 70–99)
Potassium: 4.7 mmol/L (ref 3.5–5.1)
Sodium: 141 mmol/L (ref 135–145)
Total Bilirubin: 0.3 mg/dL (ref 0.0–1.2)
Total Protein: 7.2 g/dL (ref 6.5–8.1)

## 2024-02-28 LAB — CBC WITH DIFFERENTIAL (CANCER CENTER ONLY)
Abs Immature Granulocytes: 0.02 K/uL (ref 0.00–0.07)
Basophils Absolute: 0.1 K/uL (ref 0.0–0.1)
Basophils Relative: 1 %
Eosinophils Absolute: 0.2 K/uL (ref 0.0–0.5)
Eosinophils Relative: 2 %
HCT: 32.4 % — ABNORMAL LOW (ref 36.0–46.0)
Hemoglobin: 9.7 g/dL — ABNORMAL LOW (ref 12.0–15.0)
Immature Granulocytes: 0 %
Lymphocytes Relative: 20 %
Lymphs Abs: 2 K/uL (ref 0.7–4.0)
MCH: 25.7 pg — ABNORMAL LOW (ref 26.0–34.0)
MCHC: 29.9 g/dL — ABNORMAL LOW (ref 30.0–36.0)
MCV: 85.9 fL (ref 80.0–100.0)
Monocytes Absolute: 1.3 K/uL — ABNORMAL HIGH (ref 0.1–1.0)
Monocytes Relative: 12 %
Neutro Abs: 6.7 K/uL (ref 1.7–7.7)
Neutrophils Relative %: 65 %
Platelet Count: 308 K/uL (ref 150–400)
RBC: 3.77 MIL/uL — ABNORMAL LOW (ref 3.87–5.11)
RDW: 17.5 % — ABNORMAL HIGH (ref 11.5–15.5)
WBC Count: 10.3 K/uL (ref 4.0–10.5)
nRBC: 0 % (ref 0.0–0.2)

## 2024-02-28 LAB — IRON AND IRON BINDING CAPACITY (CC-WL,HP ONLY)
Iron: 23 ug/dL — ABNORMAL LOW (ref 28–170)
Saturation Ratios: 6 % — ABNORMAL LOW (ref 10.4–31.8)
TIBC: 406 ug/dL (ref 250–450)
UIBC: 383 ug/dL

## 2024-02-28 LAB — FERRITIN: Ferritin: 22 ng/mL (ref 11–307)

## 2024-02-28 LAB — FOLATE: Folate: >20 ng/mL

## 2024-02-28 MED ORDER — FERROUS SULFATE 325 (65 FE) MG PO TBEC: 325.0000 mg | DELAYED_RELEASE_TABLET | Freq: Two times a day (BID) | ORAL | 1 refills | Status: AC

## 2024-02-28 NOTE — Telephone Encounter (Signed)
 Copied from CRM [id number]. Topic: Clinical - Prescription Issue >> Feb 25, 2024  4:20 PM Chiquita SQUIBB wrote: Reason for CRM: Patient is calling in stating she has ran out of the ferrous sulfate  325 (65 FE) MG EC tablet, due to [place] having her now take two a day and the pharmacy can not refill it yet because her prescription is for one a day. Please advise the pharmacy.

## 2024-02-28 NOTE — Telephone Encounter (Signed)
 Reviewed ov notes and [name], NP did advise for patient to take two times daily. New rx submitted and mychart message sent to patient.

## 2024-02-29 ENCOUNTER — Telehealth: Payer: Self-pay | Admitting: Pharmacy Technician

## 2024-02-29 NOTE — Telephone Encounter (Signed)
 [name], the patient will be scheduled as soon as possible.   Auth Submission: NO AUTH NEEDED Site of care: Site of care: [facility] Payer: [employer] Medicare Medication & CPT/J Code(s) submitted: Venofer (Iron Sucrose) J1756 Diagnosis Code: D50.9 Route of submission (phone, fax, portal): n/a Phone # Fax # Auth type: Buy/Bill PB Units/visits requested: 300 mg x 3 Reference number: n/a Approval from: 01/202026 to 06/08/2024

## 2024-03-07 ENCOUNTER — Ambulatory Visit

## 2024-03-07 LAB — PROTEIN ELECTROPHORESIS, SERUM, WITH REFLEX
A/G Ratio: 1.1 (ref 0.7–1.7)
Albumin ELP: 3.4 g/dL (ref 2.9–4.4)
Alpha-1-Globulin: 0.2 g/dL (ref 0.0–0.4)
Alpha-2-Globulin: 0.8 g/dL (ref 0.4–1.0)
Beta Globulin: 1 g/dL (ref 0.7–1.3)
Gamma Globulin: 1.2 g/dL (ref 0.4–1.8)
Globulin, Total: 3.2 g/dL (ref 2.2–3.9)
M-Spike, %: 0.3 g/dL — ABNORMAL HIGH
SPEP Interpretation: 0
Total Protein ELP: 6.6 g/dL (ref 6.0–8.5)

## 2024-03-07 LAB — IMMUNOFIXATION REFLEX, SERUM
IgA: 338 mg/dL (ref 64–422)
IgG (Immunoglobin G), Serum: 1207 mg/dL (ref 586–1602)
IgM (Immunoglobulin M), Srm: 89 mg/dL (ref 26–217)

## 2024-03-15 ENCOUNTER — Ambulatory Visit: Admitting: Rehabilitative and Restorative Service Providers"

## 2024-03-15 ENCOUNTER — Ambulatory Visit: Admitting: Physician Assistant

## 2024-03-15 ENCOUNTER — Ambulatory Visit: Payer: Self-pay

## 2024-03-15 ENCOUNTER — Encounter: Payer: Self-pay | Admitting: Rehabilitative and Restorative Service Providers"

## 2024-03-15 DIAGNOSIS — S52502A Unspecified fracture of the lower end of left radius, initial encounter for closed fracture: Secondary | ICD-10-CM

## 2024-03-15 DIAGNOSIS — M6281 Muscle weakness (generalized): Secondary | ICD-10-CM | POA: Diagnosis not present

## 2024-03-15 DIAGNOSIS — R600 Localized edema: Secondary | ICD-10-CM

## 2024-03-15 DIAGNOSIS — M25632 Stiffness of left wrist, not elsewhere classified: Secondary | ICD-10-CM | POA: Diagnosis not present

## 2024-03-15 NOTE — Progress Notes (Signed)
 "  Office Visit Note   Patient: [name]           Date of Birth: March 31, 1940           MRN: [id number] Visit Date: 03/15/2024              Requested by: [name], NP [address] [place]. [place],  [place] [address] PCP: [name], NP   Assessment & Plan: Visit Diagnoses:  1. Closed fracture of distal end of left radius, unspecified fracture morphology, initial encounter     Plan: Patient is currently 10 weeks status post left distal radius and ulnar styloid fractures.  She is clinically and radiographically healed.  She will continue with OT until this is no longer needed.  She will follow-up with us  as needed.  Call with concerns or questions.  Follow-Up Instructions: No follow-ups on file.   Orders:  Orders Placed This Encounter  Procedures   XR Wrist Complete Left   No orders of the defined types were placed in this encounter.     Procedures: No procedures performed   Clinical Data: No additional findings.   Subjective: Chief Complaint  Patient presents with   Left Wrist - Follow-up    Distal radius fracture    HPI patient is a pleasant 84 year old female who comes in today 10 weeks status post left distal radius and ulnar styloid fractures.  She is in no pain.  She has been wearing a Velcro splint.  She has been to 1 session of OT and is scheduled for another session today.  Review of Systems as detailed in HPI.  All others reviewed and are negative.   Objective: Vital Signs: There were no vitals taken for this visit.  Physical Exam well-developed well-nourished female in no acute distress.  Alert and oriented x 3.  Ortho Exam left wrist exam: No tenderness.  Painless range of motion although she is still somewhat limited secondary to stiffness.  Specialty Comments:  No specialty comments available.  Imaging: XR Wrist Complete Left Result Date: 03/15/2024 X-rays demonstrate consolidation of the fracture site    PMFS  History: Patient Active Problem List   Diagnosis Date Noted   Iron deficiency anemia 02/28/2024   Morbid obesity (HCC) 11/09/2023   MCI (mild cognitive impairment) 10/27/2023   Iron deficiency anemia secondary to inadequate dietary iron intake 10/27/2023   Sensorineural hearing loss, bilateral 04/27/2023   Impacted cerumen of both ears 04/27/2023   Other specified disorders of eustachian tube, right ear 04/27/2023   Closed comminuted intertrochanteric fracture of proximal femur, left, initial encounter (HCC) 10/31/2022   Accidental fall 10/31/2022   Chronic anticoagulation 10/31/2022   History of pulmonary embolism 10/31/2022   History of PAT (paroxysmal atrial tachycardia) 10/31/2022   Closed left hip fracture, initial encounter (HCC) 10/31/2022   Acute pancreatitis 06/13/2022   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/26/2022   Acquired hypercoagulable state 01/26/2022   Bilateral hearing loss 11/04/2020   Type 2 diabetes mellitus with stage 3b chronic kidney disease, without long-term current use of insulin  (HCC) 11/04/2020   RLS (restless legs syndrome) 11/04/2020   OSA (obstructive sleep apnea) 08/29/2019   Abnormal findings on diagnostic imaging of lung 08/29/2019   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   Pulmonary embolism (HCC) 02/07/2019   GERD without esophagitis 05/15/2017   Dyslipidemia 05/15/2017   Anxiety 05/15/2017   Constipation due to opioid therapy 05/15/2017   Accidental fall from chair, initial encounter 05/08/2017  Closed posttraumatic compression fracture of lumbar vertebra (HCC) 05/08/2017   Leukocytosis 05/08/2017   S/P right TKA 07/10/2014   S/P knee replacement 07/10/2014   COPD (chronic obstructive pulmonary disease) (HCC) 09/24/2013   Hypothyroidism 07/24/2012   Essential hypertension, benign 07/24/2012   Allergic rhinitis 07/24/2012   Mixed hyperlipidemia 07/24/2012   Past Medical History:  Diagnosis Date   Actinic keratosis     Arthritis    Asthma    Back injury 2019   Fracture   Basal cell carcinoma    COPD (chronic obstructive pulmonary disease) (HCC)    GERD (gastroesophageal reflux disease)    H/O blood clots 2020   Lung, Per [facility] New Patient Packet   H/O mammogram 2022   Per [facility] New Patient Packet   Heart murmur    hx of in childhood    High cholesterol    Hyperlipidemia    Hypertension    Hypothyroidism    Kidney disease    Per [facility] New Patient Packet   MCNS (minimal change nephrotic syndrome)    OSA (obstructive sleep apnea) 08/29/2019   Osteopenia    Shortness of breath dyspnea    on exertion    Squamous cell carcinoma    Thyroid  disease    Per [facility] New Patient Packet    Family History  Problem Relation Age of Onset   COPD Mother        Husband Smoked    Lymphoma Father    Hypertension Sister    Scoliosis Daughter    Stroke Maternal Grandfather    Rheumatic fever Paternal Grandmother    Heart disease Paternal Grandfather     Past Surgical History:  Procedure Laterality Date   ABDOMINAL HYSTERECTOMY     APPENDECTOMY     BILATERAL SALPINGOOPHORECTOMY     Ovarian Cysts    CHOLECYSTECTOMY N/A 06/17/2022   Procedure: LAPAROSCOPIC CHOLECYSTECTOMY;  Surgeon: Dasie Leonor CROME, MD;  [facility];  Service: General;  Laterality: N/A;   COLONOSCOPY  2001   Dr.Mann, Per [facility] New Patient Packet   CONVERSION TO TOTAL KNEE Right 07/10/2014   Procedure: RIGHT CONVERSION OF PARTIAL KNEE TO TOTAL KNEE;  Surgeon: Donnice Car, MD;  Location: [facility];  Service: Orthopedics;  Laterality: Right;   ENDOSCOPIC RETROGRADE CHOLANGIOPANCREATOGRAPHY (ERCP) WITH PROPOFOL  N/A 06/15/2022   Procedure: ENDOSCOPIC RETROGRADE CHOLANGIOPANCREATOGRAPHY (ERCP) WITH PROPOFOL ;  Surgeon: Rosalie Kitchens, MD;  [facility];  Service: Gastroenterology;  Laterality: N/A;   INTRAMEDULLARY (IM) NAIL INTERTROCHANTERIC Left 11/02/2022   Procedure: INTRAMEDULLARY NAILING OF LEFT FEMUR;  Surgeon: Jerri Kay HERO, MD;  Location[facility];  Service: Orthopedics;  Laterality: Left;   IR ANGIOGRAM PULMONARY BILATERAL SELECTIVE  02/08/2019   IR ANGIOGRAM SELECTIVE EACH ADDITIONAL VESSEL  02/08/2019   IR ANGIOGRAM SELECTIVE EACH ADDITIONAL VESSEL  02/08/2019   IR INFUSION THROMBOL ARTERIAL INITIAL (MS)  02/08/2019   IR INFUSION THROMBOL ARTERIAL INITIAL (MS)  02/08/2019   IR THROMB F/U EVAL ART/VEN FINAL DAY (MS)  02/09/2019   IR US  GUIDE VASC ACCESS RIGHT  02/08/2019   MEDIAL PARTIAL KNEE REPLACEMENT Bilateral    MOHS SURGERY     x 2   REMOVAL OF STONES  06/15/2022   Procedure: REMOVAL OF STONES;  Surgeon: Rosalie Kitchens, MD;  [facility]: [facility];  Service: Gastroenterology;;   RENAL BIOPSY     x 2   REPLACEMENT TOTAL KNEE  2012   Per [facility] New Patient Packet   ROTATOR CUFF REPAIR Left 2008  SPHINCTEROTOMY  06/15/2022   Procedure: SPHINCTEROTOMY;  Surgeon: Rosalie Kitchens, MD;  Location: [facility];  Service: Gastroenterology;;   Social History   Occupational History   Occupation: TEACHER     Employer: [employer]    Comment: RETIRED   Tobacco Use   Smoking status: Former    Current packs/day: 0.00    Average packs/day: 1.5 packs/day for 25.0 years (37.5 ttl pk-yrs)    Types: Cigarettes    Start date: 01/13/1948    Quit date: 01/12/1973    Years since quitting: 51.2   Smokeless tobacco: Never  Vaping Use   Vaping status: Never Used  Substance and Sexual Activity   Alcohol  use: Yes    Alcohol /week: 0.5 standard drinks of alcohol     Types: 1 Standard drinks or equivalent per week    Comment: She occasionally drinks a glass of wine.     Drug use: No   Sexual activity: Not Currently    Partners: Male        "

## 2024-03-17 ENCOUNTER — Ambulatory Visit: Admitting: Pulmonary Disease

## 2024-03-17 ENCOUNTER — Telehealth: Payer: Self-pay

## 2024-03-17 ENCOUNTER — Encounter: Payer: Self-pay | Admitting: Pulmonary Disease

## 2024-03-17 VITALS — BP 146/73 | HR 62 | Ht 66.0 in | Wt 230.0 lb

## 2024-03-17 DIAGNOSIS — J441 Chronic obstructive pulmonary disease with (acute) exacerbation: Secondary | ICD-10-CM

## 2024-03-17 NOTE — Progress Notes (Unsigned)
 "              [name]    [id number]    [birth year]  Primary Care Physician:Eubanks, Harlene POUR, NP  Referring Physician: [name], NP [address] [place]. 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 - She may benefit from pulmonary rehab  Exercise tolerance only about 5 minutes She does uses a cane and a rollator whenever she is trying to go out to shop or outdoors  For nasal stuffiness congestion she uses Flonase , Atrovent  nasal  Past history of obstructive sleep apnea - Did not tolerate CPAP Stopped using it after a while as it was keeping her up more than helping  Daughter was present for the visit  History of hypertension, hyperlipidemia, hypothyroidism, GERD  Outpatient Encounter Medications as of 03/17/2024  Medication Sig   albuterol  (VENTOLIN  HFA) 108 (90 Base) MCG/ACT inhaler Inhale 1-2 puffs into the lungs every 6 (six) hours as needed for wheezing or shortness of breath.   Azelastine  HCl 137 MCG/SPRAY SOLN PLACE 2 SPRAYS INTO BOTH NOSTRILS 2 (TWO) TIMES DAILY. USE IN EACH NOSTRIL AS DIRECTED   citalopram  (CELEXA ) 20 MG tablet TAKE 1 TABLET (20 MG TOTAL) BY MOUTH AT BEDTIME. DX F41.9   denosumab  (PROLIA ) 60 MG/ML SOSY injection Inject 60 mg into the skin every 6 (six) months.   docusate sodium  (COLACE) 100 MG capsule Take 1 capsule (100 mg total) by mouth 2 (two) times daily. (Patient taking differently: Take 100 mg by mouth daily as needed for moderate constipation.)   donepezil  (ARICEPT ) 10 MG tablet TAKE 1 TABLET BY MOUTH EVERYDAY AT BEDTIME   ELIQUIS  5 MG TABS tablet TAKE 1 TABLET BY MOUTH TWICE A DAY   ferrous sulfate  325 (65 FE) MG EC  tablet Take 1 tablet (325 mg total) by mouth 2 (two) times daily with a meal.   fluticasone  (FLONASE ) 50 MCG/ACT nasal spray SPRAY 1 SPRAY INTO EACH NOSTRIL DAILY   Fluticasone -Umeclidin-Vilant (TRELEGY ELLIPTA ) 100-62.5-25 MCG/ACT AEPB Inhale 1 puff into the lungs daily.   ipratropium (ATROVENT ) 0.06 % nasal spray Place 2 sprays into both nostrils 3 (three) times daily.   levothyroxine  (SYNTHROID ) 88 MCG tablet TAKE 1 TABLET BY MOUTH EVERY DAY IN THE MORNING   losartan  (COZAAR ) 100 MG tablet TAKE 1 TABLET BY MOUTH EVERY DAY   Melatonin 10 MG TABS Take 10 mg by mouth at bedtime.   metFORMIN  (GLUCOPHAGE ) 500 MG tablet Take 1 tablet (500 mg total) by mouth daily with supper.   metoprolol  tartrate (LOPRESSOR ) 25 MG tablet Take 1 tablet (25 mg total) by mouth 2 (two) times daily.   Multiple Vitamin (MULTIVITAMIN WITH MINERALS) TABS tablet Take 1 tablet by mouth daily with breakfast.   pantoprazole  (PROTONIX ) 40 MG tablet TAKE 1 TABLET BY MOUTH EVERYDAY AT BEDTIME   rOPINIRole  (REQUIP ) 1 MG tablet Take 1 tablet (1 mg total) by mouth at bedtime.   rosuvastatin  (CRESTOR ) 20 MG tablet TAKE 1 TABLET BY MOUTH EVERY DAY   tacrolimus  (PROGRAF ) 1 MG capsule Take 1 mg by mouth in the morning and at bedtime.   TYLENOL  325 MG CAPS Take 325-650 mg by mouth every 6 (  six) hours as needed (for pain or headaches).   Facility-Administered Encounter Medications as of 03/17/2024  Medication   denosumab  (PROLIA ) injection 60 mg    Allergies as of 03/17/2024   (No Known Allergies)    Past Medical History:  Diagnosis Date   Actinic keratosis    Arthritis    Asthma    Back injury 2019   Fracture   Basal cell carcinoma    COPD (chronic obstructive pulmonary disease) (HCC)    GERD (gastroesophageal reflux disease)    H/O blood clots 2020   Lung, Per [facility] New Patient Packet   H/O mammogram 2022   Per [facility] New Patient Packet   Heart murmur    hx of in childhood    High cholesterol    Hyperlipidemia     Hypertension    Hypothyroidism    Kidney disease    Per [facility] New Patient Packet   MCNS (minimal change nephrotic syndrome)    OSA (obstructive sleep apnea) 08/29/2019   Osteopenia    Shortness of breath dyspnea    on exertion    Squamous cell carcinoma    Thyroid  disease    Per [facility] New Patient Packet    Past Surgical History:  Procedure Laterality Date   ABDOMINAL HYSTERECTOMY     APPENDECTOMY     BILATERAL SALPINGOOPHORECTOMY     Ovarian Cysts    CHOLECYSTECTOMY N/A 06/17/2022   Procedure: LAPAROSCOPIC CHOLECYSTECTOMY;  Surgeon: Dasie Leonor CROME, MD;  Location: [facility];  Service: General;  Laterality: N/A;   COLONOSCOPY  2001   Dr.Mann, Per [facility] New Patient Packet   CONVERSION TO TOTAL KNEE Right 07/10/2014   Procedure: RIGHT CONVERSION OF PARTIAL KNEE TO TOTAL KNEE;  Surgeon: Donnice Car, MD;  Location: [facility];  Service: Orthopedics;  Laterality: Right;   ENDOSCOPIC RETROGRADE CHOLANGIOPANCREATOGRAPHY (ERCP) WITH PROPOFOL  N/A 06/15/2022   Procedure: ENDOSCOPIC RETROGRADE CHOLANGIOPANCREATOGRAPHY (ERCP) WITH PROPOFOL ;  Surgeon: Rosalie Kitchens, MD;  Location: [facility];  Service: Gastroenterology;  Laterality: N/A;   INTRAMEDULLARY (IM) NAIL INTERTROCHANTERIC Left 11/02/2022   Procedure: INTRAMEDULLARY NAILING OF LEFT FEMUR;  Surgeon: Jerri Kay HERO, MD;  Location: [facility];  Service: Orthopedics;  Laterality: Left;   IR ANGIOGRAM PULMONARY BILATERAL SELECTIVE  02/08/2019   IR ANGIOGRAM SELECTIVE EACH ADDITIONAL VESSEL  02/08/2019   IR ANGIOGRAM SELECTIVE EACH ADDITIONAL VESSEL  02/08/2019   IR INFUSION THROMBOL ARTERIAL INITIAL (MS)  02/08/2019   IR INFUSION THROMBOL ARTERIAL INITIAL (MS)  02/08/2019   IR THROMB F/U EVAL ART/VEN FINAL DAY (MS)  02/09/2019   IR US  GUIDE VASC ACCESS RIGHT  02/08/2019   MEDIAL PARTIAL KNEE REPLACEMENT Bilateral    MOHS SURGERY     x 2   REMOVAL OF STONES  06/15/2022   Procedure: REMOVAL OF STONES;  Surgeon: Rosalie Kitchens, MD;  Location: [facility];  Service:  Gastroenterology;;   RENAL BIOPSY     x 2   REPLACEMENT TOTAL KNEE  2012   Per [facility] New Patient Packet   ROTATOR CUFF REPAIR Left 2008   SPHINCTEROTOMY  06/15/2022   Procedure: SPHINCTEROTOMY;  Surgeon: Rosalie Kitchens, MD;  Location: [facility];  Service: Gastroenterology;;    Family History  Problem Relation Age of Onset   COPD Mother        Husband Smoked    Lymphoma Father    Hypertension Sister    Scoliosis Daughter    Stroke Maternal Grandfather    Rheumatic fever Paternal Grandmother    Heart  disease Paternal Grandfather     Social History   Socioeconomic History   Marital status: Widowed    Spouse name: Not on file   Number of children: 2   Years of education: 16   Highest education level: Not on file  Occupational History   Occupation: TEACHER     Employer: [employer]    Comment: RETIRED   Tobacco Use   Smoking status: Former    Current packs/day: 0.00    Average packs/day: 1.5 packs/day for 25.0 years (37.5 ttl pk-yrs)    Types: Cigarettes    Start date: 01/13/1948    Quit date: 01/12/1973    Years since quitting: 51.2   Smokeless tobacco: Never  Vaping Use   Vaping status: Never Used  Substance and Sexual Activity   Alcohol  use: Yes    Alcohol /week: 0.5 standard drinks of alcohol     Types: 1 Standard drinks or equivalent per week    Comment: She occasionally drinks a glass of wine.     Drug use: No   Sexual activity: Not Currently    Partners: Male  Other Topics Concern   Not on file  Social History Narrative   Marital Status:  Widowed   Siblings: Kitty Simmons/ Rachel RuthChildren:  2;  Grandchildren:  5 Pets: NoneLiving Situation: Lives aloneOccupation: Retired Public Affairs Consultant: Engineer, Maintenance (it) Tobacco Use/Exposure:  She quit smoking > 20 years ago after having smoked 1 1/2 ppd for over 20 years.  Alcohol  Use:  Occasional (Wine) Drug Use:  NoneDiet:  RegularExercise:  Water Aerobic Class 3 days a week and WeightsHobbies: Reading/ Crafts/  Biking/ Reading]      Per [facility] New patient packet abstracted on 11/01/2020   Diet: Left blank      Caffeine: Yes      Married, if yes what year: Widow, married in 1961      Do you live in a house, apartment, assisted living, condo, trailer, ect: Patient did not answer correctly (lived alone until 10/1/20222)      Is it one or more stories: 2      How many persons live in your home? 2      Pets: cat      Highest level or education completed: 1 year of college       Current/Past profession: Geologist, Engineering       Exercise:      No            Type and how often:          Living Will: Yes   DNR: Yes   POA/HPOA: Yes      Functional Status:   Do you have difficulty bathing or dressing yourself?  Left blank   Do you have difficulty preparing food or eating?Left blank   Do you have difficulty managing your medications?Left blank   Do you have difficulty managing your finances?Left blank   Do you have difficulty affording your medications?Left blank   Social Drivers of Health   Tobacco Use: Medium Risk (03/17/2024)   Patient History    Smoking Tobacco Use: Former    Smokeless Tobacco Use: Never    Passive Exposure: Not on file  Financial Resource Strain: Not on file  Food Insecurity: No Food Insecurity (02/28/2024)   Epic    Worried About Programme Researcher, Broadcasting/film/video in the Last Year: Never true    Ran Out of Food in the Last Year: Never true  Transportation Needs: No Transportation Needs (02/28/2024)  Epic    Lack of Transportation (Medical): No    Lack of Transportation (Non-Medical): No  Physical Activity: Not on file  Stress: Not on file  Social Connections: Not on file  Intimate Partner Violence: Not At Risk (02/28/2024)   Epic    Fear of Current or Ex-Partner: No    Emotionally Abused: No    Physically Abused: No    Sexually Abused: No  Depression (PHQ2-9): Low Risk (02/28/2024)   Depression (PHQ2-9)    PHQ-2 Score: 0  Alcohol  Screen: Not on file  Housing: Low Risk  (02/28/2024)   Epic    Unable to Pay for Housing in the Last Year: No    Number of Times Moved in the Last Year: 0    Homeless in the Last Year: No  Utilities: Not At Risk (02/28/2024)   Epic    Threatened with loss of utilities: No  Health Literacy: Not on file    Review of Systems  Respiratory:  Positive for shortness of breath.     Vitals:   03/17/24 1138  BP: (!) 146/73  Pulse: 62  SpO2: 94%     Physical Exam Constitutional:      Appearance: She is obese.  HENT:     Head: Normocephalic.     Nose: Nose normal.     Mouth/Throat:     Mouth: Mucous membranes are moist.  Eyes:     General: No scleral icterus. Cardiovascular:     Rate and Rhythm: Normal rate and regular rhythm.     Heart sounds: No murmur heard.    No friction rub.  Pulmonary:     Effort: No respiratory distress.     Breath sounds: No stridor. No wheezing or rhonchi.  Musculoskeletal:        General: Normal range of motion.     Cervical back: No rigidity or tenderness.  Skin:    General: Skin is warm.  Neurological:     General: No focal deficit present.     Mental Status: She is alert.  Psychiatric:        Mood and Affect: Mood normal.    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  Shortness of breath on exertion - Some degree of deconditioning may be playing a role as well  I believe she will benefit from pulmonary rehab which we did discuss today  I suggested just trying to get back to her gym as well which may help  Did not tolerate CPAP  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  History of iron deficiency anemia  Plan/Recommendations: Continue Trelegy  Continue  albuterol  as needed  Greater activity as tolerated  Will place referral for pulmonary rehab  Follow-up in about 6 months  Encouraged to try to get more active  She is scheduled for iron infusions and she is hopeful that this will help  I personally spent a total of 30 minutes in the care of the patient today including preparing to see the patient, getting/reviewing separately obtained history, performing a medically appropriate exam/evaluation, counseling and educating, documenting clinical information in the EHR, and independently interpreting results.  Jennet Epley MD [facility] 03/17/2024, 11:44 AM  CC: [name], NP   "

## 2024-03-17 NOTE — Telephone Encounter (Signed)
"  ° °  Pre-operative Risk Assessment    Patient Name: [name]  DOB: [birth year] MRN: [id number]   Date of last office visit: 04/15/23 Date of next office visit: Not scheduled   Request for Surgical Clearance    Procedure:  EGD and colonoscopy  Date of Surgery:  Clearance 04/21/24                                Surgeon:  Not provided Surgeon's Group or Practice Name:  [facility] , [place] Phone number:  [phone] Fax number:  [phone]   Type of Clearance Requested:   - Medical    Type of Anesthesia:  Propofol    Additional requests/questions:    [name] [name] [name]   03/17/2024, 5:23 PM   "

## 2024-03-17 NOTE — Patient Instructions (Signed)
 Place order for pulmonary rehab  Continue Trelegy  Graded activities as tolerated  Use albuterol  as needed  Call us  with significant concerns  Follow-up in 6 months

## 2024-03-20 ENCOUNTER — Ambulatory Visit

## 2024-04-27 ENCOUNTER — Inpatient Hospital Stay: Admitting: Physician Assistant

## 2024-04-27 ENCOUNTER — Inpatient Hospital Stay

## 2024-06-12 ENCOUNTER — Ambulatory Visit: Payer: Self-pay | Admitting: Nurse Practitioner

## 2024-07-14 ENCOUNTER — Other Ambulatory Visit

## 2024-07-21 ENCOUNTER — Ambulatory Visit: Admitting: Nurse Practitioner
